# Patient Record
Sex: Female | Born: 1935 | Race: White | Hispanic: No | Marital: Single | Smoking: Never smoker
Health system: Southern US, Community
[De-identification: ages and names within clinical notes are randomized; demographics above are authoritative.]

## PROBLEM LIST (undated history)

## (undated) DIAGNOSIS — H409 Unspecified glaucoma: Secondary | ICD-10-CM

## (undated) DIAGNOSIS — K209 Esophagitis, unspecified without bleeding: Secondary | ICD-10-CM

## (undated) DIAGNOSIS — G4730 Sleep apnea, unspecified: Secondary | ICD-10-CM

## (undated) DIAGNOSIS — M810 Age-related osteoporosis without current pathological fracture: Secondary | ICD-10-CM

## (undated) DIAGNOSIS — K219 Gastro-esophageal reflux disease without esophagitis: Secondary | ICD-10-CM

## (undated) DIAGNOSIS — M1990 Unspecified osteoarthritis, unspecified site: Secondary | ICD-10-CM

## (undated) DIAGNOSIS — E7800 Pure hypercholesterolemia, unspecified: Secondary | ICD-10-CM

## (undated) DIAGNOSIS — J42 Unspecified chronic bronchitis: Secondary | ICD-10-CM

## (undated) DIAGNOSIS — G459 Transient cerebral ischemic attack, unspecified: Secondary | ICD-10-CM

## (undated) DIAGNOSIS — K2270 Barrett's esophagus without dysplasia: Secondary | ICD-10-CM

## (undated) DIAGNOSIS — F329 Major depressive disorder, single episode, unspecified: Secondary | ICD-10-CM

## (undated) DIAGNOSIS — F32A Depression, unspecified: Secondary | ICD-10-CM

## (undated) HISTORY — DX: Esophagitis, unspecified without bleeding: K20.90

## (undated) HISTORY — DX: Pure hypercholesterolemia, unspecified: E78.00

## (undated) HISTORY — DX: Unspecified osteoarthritis, unspecified site: M19.90

## (undated) HISTORY — DX: Transient cerebral ischemic attack, unspecified: G45.9

## (undated) HISTORY — PX: BUNIONECTOMY: SHX129

## (undated) HISTORY — PX: ROTATOR CUFF REPAIR: SHX139

## (undated) HISTORY — DX: Unspecified glaucoma: H40.9

## (undated) HISTORY — DX: Age-related osteoporosis without current pathological fracture: M81.0

## (undated) HISTORY — DX: Gastro-esophageal reflux disease without esophagitis: K21.9

## (undated) HISTORY — DX: Major depressive disorder, single episode, unspecified: F32.9

## (undated) HISTORY — DX: Sleep apnea, unspecified: G47.30

## (undated) HISTORY — DX: Depression, unspecified: F32.A

## (undated) HISTORY — DX: Esophagitis, unspecified: K20.9

## (undated) HISTORY — DX: Barrett's esophagus without dysplasia: K22.70

## (undated) HISTORY — DX: Unspecified chronic bronchitis: J42

---

## 2004-05-01 ENCOUNTER — Encounter: Payer: Self-pay | Admitting: Internal Medicine

## 2004-06-01 ENCOUNTER — Encounter: Payer: Self-pay | Admitting: Internal Medicine

## 2004-06-29 ENCOUNTER — Ambulatory Visit: Payer: Self-pay | Admitting: Internal Medicine

## 2004-07-01 ENCOUNTER — Encounter: Payer: Self-pay | Admitting: Internal Medicine

## 2004-08-01 ENCOUNTER — Encounter: Payer: Self-pay | Admitting: Internal Medicine

## 2004-08-04 ENCOUNTER — Ambulatory Visit: Payer: Self-pay | Admitting: Internal Medicine

## 2004-09-01 ENCOUNTER — Encounter: Payer: Self-pay | Admitting: Internal Medicine

## 2004-09-29 ENCOUNTER — Encounter: Payer: Self-pay | Admitting: Internal Medicine

## 2004-10-30 ENCOUNTER — Encounter: Payer: Self-pay | Admitting: Internal Medicine

## 2004-11-29 ENCOUNTER — Encounter: Payer: Self-pay | Admitting: Internal Medicine

## 2004-12-30 ENCOUNTER — Encounter: Payer: Self-pay | Admitting: Internal Medicine

## 2005-01-29 ENCOUNTER — Encounter: Payer: Self-pay | Admitting: Internal Medicine

## 2005-03-01 ENCOUNTER — Encounter: Payer: Self-pay | Admitting: Internal Medicine

## 2005-04-01 ENCOUNTER — Encounter: Payer: Self-pay | Admitting: Internal Medicine

## 2005-05-01 ENCOUNTER — Encounter: Payer: Self-pay | Admitting: Internal Medicine

## 2005-05-04 ENCOUNTER — Ambulatory Visit: Payer: Self-pay | Admitting: Internal Medicine

## 2005-06-01 ENCOUNTER — Encounter: Payer: Self-pay | Admitting: Internal Medicine

## 2005-07-01 ENCOUNTER — Encounter: Payer: Self-pay | Admitting: Internal Medicine

## 2005-07-22 ENCOUNTER — Encounter: Payer: Self-pay | Admitting: Internal Medicine

## 2005-08-01 ENCOUNTER — Encounter: Payer: Self-pay | Admitting: Internal Medicine

## 2005-08-08 ENCOUNTER — Ambulatory Visit: Payer: Self-pay | Admitting: Internal Medicine

## 2005-08-11 ENCOUNTER — Encounter: Payer: Self-pay | Admitting: Internal Medicine

## 2005-09-01 ENCOUNTER — Encounter: Payer: Self-pay | Admitting: Internal Medicine

## 2005-09-29 ENCOUNTER — Encounter: Payer: Self-pay | Admitting: Internal Medicine

## 2005-10-30 ENCOUNTER — Encounter: Payer: Self-pay | Admitting: Internal Medicine

## 2005-11-29 ENCOUNTER — Encounter: Payer: Self-pay | Admitting: Internal Medicine

## 2005-12-30 ENCOUNTER — Encounter: Payer: Self-pay | Admitting: Internal Medicine

## 2006-01-29 ENCOUNTER — Encounter: Payer: Self-pay | Admitting: Internal Medicine

## 2006-03-01 ENCOUNTER — Encounter: Payer: Self-pay | Admitting: Internal Medicine

## 2006-04-01 ENCOUNTER — Encounter: Payer: Self-pay | Admitting: Internal Medicine

## 2006-05-01 ENCOUNTER — Encounter: Payer: Self-pay | Admitting: Internal Medicine

## 2006-06-01 ENCOUNTER — Encounter: Payer: Self-pay | Admitting: Internal Medicine

## 2006-08-11 ENCOUNTER — Ambulatory Visit: Payer: Self-pay | Admitting: Internal Medicine

## 2006-08-21 ENCOUNTER — Ambulatory Visit: Payer: Self-pay | Admitting: Internal Medicine

## 2006-10-06 ENCOUNTER — Encounter: Payer: Self-pay | Admitting: Internal Medicine

## 2006-10-28 ENCOUNTER — Inpatient Hospital Stay: Payer: Self-pay | Admitting: Rheumatology

## 2006-10-28 ENCOUNTER — Other Ambulatory Visit: Payer: Self-pay

## 2006-10-31 ENCOUNTER — Encounter: Payer: Self-pay | Admitting: Internal Medicine

## 2006-12-18 ENCOUNTER — Encounter: Payer: Self-pay | Admitting: Internal Medicine

## 2006-12-31 ENCOUNTER — Encounter: Payer: Self-pay | Admitting: Internal Medicine

## 2007-02-11 ENCOUNTER — Emergency Department: Payer: Self-pay | Admitting: Emergency Medicine

## 2007-03-01 ENCOUNTER — Encounter: Payer: Self-pay | Admitting: Internal Medicine

## 2007-05-10 ENCOUNTER — Encounter: Payer: Self-pay | Admitting: Rheumatology

## 2007-06-02 ENCOUNTER — Encounter: Payer: Self-pay | Admitting: Rheumatology

## 2007-07-02 ENCOUNTER — Encounter: Payer: Self-pay | Admitting: Rheumatology

## 2007-08-09 ENCOUNTER — Ambulatory Visit: Payer: Self-pay | Admitting: Internal Medicine

## 2007-09-19 ENCOUNTER — Ambulatory Visit: Payer: Self-pay | Admitting: Internal Medicine

## 2007-12-05 ENCOUNTER — Ambulatory Visit: Payer: Self-pay | Admitting: Internal Medicine

## 2008-01-22 ENCOUNTER — Ambulatory Visit: Payer: Self-pay | Admitting: Internal Medicine

## 2008-07-28 ENCOUNTER — Encounter: Payer: Self-pay | Admitting: Internal Medicine

## 2008-08-01 ENCOUNTER — Encounter: Payer: Self-pay | Admitting: Internal Medicine

## 2008-09-01 ENCOUNTER — Encounter: Payer: Self-pay | Admitting: Internal Medicine

## 2008-09-29 ENCOUNTER — Encounter: Payer: Self-pay | Admitting: Internal Medicine

## 2008-09-30 ENCOUNTER — Encounter: Payer: Self-pay | Admitting: Pulmonary Disease

## 2008-10-15 ENCOUNTER — Ambulatory Visit: Payer: Self-pay | Admitting: Internal Medicine

## 2008-10-30 ENCOUNTER — Encounter: Payer: Self-pay | Admitting: Internal Medicine

## 2008-11-29 ENCOUNTER — Encounter: Payer: Self-pay | Admitting: Internal Medicine

## 2009-03-23 ENCOUNTER — Encounter: Payer: Self-pay | Admitting: Pulmonary Disease

## 2009-04-16 DIAGNOSIS — M810 Age-related osteoporosis without current pathological fracture: Secondary | ICD-10-CM | POA: Insufficient documentation

## 2009-04-16 DIAGNOSIS — G459 Transient cerebral ischemic attack, unspecified: Secondary | ICD-10-CM | POA: Insufficient documentation

## 2009-04-16 DIAGNOSIS — K2270 Barrett's esophagus without dysplasia: Secondary | ICD-10-CM | POA: Insufficient documentation

## 2009-04-16 DIAGNOSIS — E7800 Pure hypercholesterolemia, unspecified: Secondary | ICD-10-CM | POA: Insufficient documentation

## 2009-04-16 DIAGNOSIS — H409 Unspecified glaucoma: Secondary | ICD-10-CM | POA: Insufficient documentation

## 2009-04-16 DIAGNOSIS — G4733 Obstructive sleep apnea (adult) (pediatric): Secondary | ICD-10-CM | POA: Insufficient documentation

## 2009-04-16 DIAGNOSIS — M1990 Unspecified osteoarthritis, unspecified site: Secondary | ICD-10-CM | POA: Insufficient documentation

## 2009-04-17 ENCOUNTER — Ambulatory Visit: Payer: Self-pay | Admitting: Pulmonary Disease

## 2009-04-17 DIAGNOSIS — R0602 Shortness of breath: Secondary | ICD-10-CM | POA: Insufficient documentation

## 2009-10-30 ENCOUNTER — Ambulatory Visit: Payer: Self-pay | Admitting: Internal Medicine

## 2009-12-17 ENCOUNTER — Emergency Department: Payer: Self-pay | Admitting: Emergency Medicine

## 2009-12-20 ENCOUNTER — Emergency Department: Payer: Self-pay | Admitting: Emergency Medicine

## 2009-12-22 ENCOUNTER — Emergency Department: Payer: Self-pay | Admitting: Emergency Medicine

## 2009-12-28 ENCOUNTER — Emergency Department: Payer: Self-pay | Admitting: Unknown Physician Specialty

## 2009-12-31 ENCOUNTER — Emergency Department: Payer: Self-pay | Admitting: Emergency Medicine

## 2010-01-03 ENCOUNTER — Emergency Department: Payer: Self-pay | Admitting: Internal Medicine

## 2010-01-06 ENCOUNTER — Ambulatory Visit: Payer: Self-pay | Admitting: Internal Medicine

## 2010-01-08 ENCOUNTER — Ambulatory Visit: Payer: Self-pay | Admitting: Internal Medicine

## 2010-01-11 ENCOUNTER — Ambulatory Visit: Payer: Self-pay | Admitting: Internal Medicine

## 2010-01-14 ENCOUNTER — Ambulatory Visit: Payer: Self-pay | Admitting: Internal Medicine

## 2010-01-18 ENCOUNTER — Ambulatory Visit: Payer: Self-pay | Admitting: Internal Medicine

## 2010-01-22 ENCOUNTER — Ambulatory Visit: Payer: Self-pay | Admitting: Internal Medicine

## 2010-01-26 ENCOUNTER — Ambulatory Visit: Payer: Self-pay | Admitting: Internal Medicine

## 2010-02-03 ENCOUNTER — Ambulatory Visit: Payer: Self-pay | Admitting: Internal Medicine

## 2010-02-11 ENCOUNTER — Ambulatory Visit: Payer: Self-pay | Admitting: Internal Medicine

## 2010-02-12 ENCOUNTER — Ambulatory Visit: Payer: Self-pay | Admitting: Internal Medicine

## 2010-02-16 ENCOUNTER — Ambulatory Visit: Payer: Self-pay | Admitting: Internal Medicine

## 2010-02-23 ENCOUNTER — Ambulatory Visit: Payer: Self-pay | Admitting: Internal Medicine

## 2010-11-02 ENCOUNTER — Ambulatory Visit: Payer: Self-pay | Admitting: Internal Medicine

## 2010-12-20 ENCOUNTER — Emergency Department: Payer: Self-pay | Admitting: Emergency Medicine

## 2010-12-21 ENCOUNTER — Emergency Department: Payer: Self-pay | Admitting: Emergency Medicine

## 2011-08-04 DIAGNOSIS — H4011X0 Primary open-angle glaucoma, stage unspecified: Secondary | ICD-10-CM | POA: Diagnosis not present

## 2011-08-04 DIAGNOSIS — H353190 Nonexudative age-related macular degeneration, unspecified eye, stage unspecified: Secondary | ICD-10-CM | POA: Diagnosis not present

## 2011-08-04 DIAGNOSIS — H35359 Cystoid macular degeneration, unspecified eye: Secondary | ICD-10-CM | POA: Diagnosis not present

## 2011-08-04 DIAGNOSIS — H5700 Unspecified anomaly of pupillary function: Secondary | ICD-10-CM | POA: Diagnosis not present

## 2011-08-09 DIAGNOSIS — R5381 Other malaise: Secondary | ICD-10-CM | POA: Diagnosis not present

## 2011-08-09 DIAGNOSIS — E7800 Pure hypercholesterolemia, unspecified: Secondary | ICD-10-CM | POA: Diagnosis not present

## 2011-08-09 DIAGNOSIS — M810 Age-related osteoporosis without current pathological fracture: Secondary | ICD-10-CM | POA: Diagnosis not present

## 2011-08-16 DIAGNOSIS — R059 Cough, unspecified: Secondary | ICD-10-CM | POA: Diagnosis not present

## 2011-08-16 DIAGNOSIS — R05 Cough: Secondary | ICD-10-CM | POA: Diagnosis not present

## 2011-08-16 DIAGNOSIS — K209 Esophagitis, unspecified without bleeding: Secondary | ICD-10-CM | POA: Diagnosis not present

## 2011-08-16 DIAGNOSIS — K219 Gastro-esophageal reflux disease without esophagitis: Secondary | ICD-10-CM | POA: Diagnosis not present

## 2011-08-16 DIAGNOSIS — M5137 Other intervertebral disc degeneration, lumbosacral region: Secondary | ICD-10-CM | POA: Diagnosis not present

## 2011-08-16 DIAGNOSIS — M810 Age-related osteoporosis without current pathological fracture: Secondary | ICD-10-CM | POA: Diagnosis not present

## 2011-08-16 DIAGNOSIS — M549 Dorsalgia, unspecified: Secondary | ICD-10-CM | POA: Diagnosis not present

## 2011-08-25 DIAGNOSIS — Z1211 Encounter for screening for malignant neoplasm of colon: Secondary | ICD-10-CM | POA: Diagnosis not present

## 2011-08-31 DIAGNOSIS — H353190 Nonexudative age-related macular degeneration, unspecified eye, stage unspecified: Secondary | ICD-10-CM | POA: Diagnosis not present

## 2011-08-31 DIAGNOSIS — H35369 Drusen (degenerative) of macula, unspecified eye: Secondary | ICD-10-CM | POA: Diagnosis not present

## 2011-08-31 DIAGNOSIS — H4011X0 Primary open-angle glaucoma, stage unspecified: Secondary | ICD-10-CM | POA: Diagnosis not present

## 2011-08-31 DIAGNOSIS — H409 Unspecified glaucoma: Secondary | ICD-10-CM | POA: Diagnosis not present

## 2011-09-21 DIAGNOSIS — R918 Other nonspecific abnormal finding of lung field: Secondary | ICD-10-CM | POA: Diagnosis not present

## 2011-09-21 DIAGNOSIS — J8289 Other pulmonary eosinophilia, not elsewhere classified: Secondary | ICD-10-CM | POA: Diagnosis not present

## 2011-09-22 DIAGNOSIS — H353190 Nonexudative age-related macular degeneration, unspecified eye, stage unspecified: Secondary | ICD-10-CM | POA: Diagnosis not present

## 2011-09-22 DIAGNOSIS — H409 Unspecified glaucoma: Secondary | ICD-10-CM | POA: Diagnosis not present

## 2011-09-22 DIAGNOSIS — H35369 Drusen (degenerative) of macula, unspecified eye: Secondary | ICD-10-CM | POA: Diagnosis not present

## 2011-09-22 DIAGNOSIS — H4011X0 Primary open-angle glaucoma, stage unspecified: Secondary | ICD-10-CM | POA: Diagnosis not present

## 2011-10-12 DIAGNOSIS — R351 Nocturia: Secondary | ICD-10-CM | POA: Diagnosis not present

## 2011-11-14 DIAGNOSIS — H4011X0 Primary open-angle glaucoma, stage unspecified: Secondary | ICD-10-CM | POA: Diagnosis not present

## 2011-11-14 DIAGNOSIS — H409 Unspecified glaucoma: Secondary | ICD-10-CM | POA: Diagnosis not present

## 2011-11-14 DIAGNOSIS — Z961 Presence of intraocular lens: Secondary | ICD-10-CM | POA: Diagnosis not present

## 2011-11-15 ENCOUNTER — Ambulatory Visit: Payer: Self-pay | Admitting: Internal Medicine

## 2011-11-15 DIAGNOSIS — Z1231 Encounter for screening mammogram for malignant neoplasm of breast: Secondary | ICD-10-CM | POA: Diagnosis not present

## 2011-11-15 DIAGNOSIS — N6459 Other signs and symptoms in breast: Secondary | ICD-10-CM | POA: Diagnosis not present

## 2011-11-17 ENCOUNTER — Ambulatory Visit: Payer: Self-pay | Admitting: Internal Medicine

## 2011-11-17 DIAGNOSIS — N63 Unspecified lump in unspecified breast: Secondary | ICD-10-CM | POA: Diagnosis not present

## 2011-11-17 DIAGNOSIS — N6489 Other specified disorders of breast: Secondary | ICD-10-CM | POA: Diagnosis not present

## 2011-11-21 ENCOUNTER — Encounter: Payer: Self-pay | Admitting: Internal Medicine

## 2011-11-21 DIAGNOSIS — R262 Difficulty in walking, not elsewhere classified: Secondary | ICD-10-CM | POA: Diagnosis not present

## 2011-11-21 DIAGNOSIS — IMO0001 Reserved for inherently not codable concepts without codable children: Secondary | ICD-10-CM | POA: Diagnosis not present

## 2011-11-30 ENCOUNTER — Encounter: Payer: Self-pay | Admitting: Internal Medicine

## 2011-12-07 DIAGNOSIS — H903 Sensorineural hearing loss, bilateral: Secondary | ICD-10-CM | POA: Diagnosis not present

## 2011-12-07 DIAGNOSIS — H6120 Impacted cerumen, unspecified ear: Secondary | ICD-10-CM | POA: Diagnosis not present

## 2011-12-13 DIAGNOSIS — H409 Unspecified glaucoma: Secondary | ICD-10-CM | POA: Diagnosis not present

## 2011-12-13 DIAGNOSIS — H401490 Capsular glaucoma with pseudoexfoliation of lens, unspecified eye, stage unspecified: Secondary | ICD-10-CM | POA: Diagnosis not present

## 2011-12-14 DIAGNOSIS — H6120 Impacted cerumen, unspecified ear: Secondary | ICD-10-CM | POA: Diagnosis not present

## 2011-12-14 DIAGNOSIS — H903 Sensorineural hearing loss, bilateral: Secondary | ICD-10-CM | POA: Diagnosis not present

## 2011-12-30 DIAGNOSIS — H60509 Unspecified acute noninfective otitis externa, unspecified ear: Secondary | ICD-10-CM | POA: Diagnosis not present

## 2012-01-10 DIAGNOSIS — H60509 Unspecified acute noninfective otitis externa, unspecified ear: Secondary | ICD-10-CM | POA: Diagnosis not present

## 2012-01-13 ENCOUNTER — Ambulatory Visit: Payer: Self-pay | Admitting: Anesthesiology

## 2012-01-13 ENCOUNTER — Ambulatory Visit: Payer: Self-pay | Admitting: Unknown Physician Specialty

## 2012-01-13 DIAGNOSIS — Z01810 Encounter for preprocedural cardiovascular examination: Secondary | ICD-10-CM | POA: Diagnosis not present

## 2012-01-13 DIAGNOSIS — H6520 Chronic serous otitis media, unspecified ear: Secondary | ICD-10-CM | POA: Diagnosis not present

## 2012-01-13 DIAGNOSIS — H60509 Unspecified acute noninfective otitis externa, unspecified ear: Secondary | ICD-10-CM | POA: Diagnosis not present

## 2012-01-13 DIAGNOSIS — H6980 Other specified disorders of Eustachian tube, unspecified ear: Secondary | ICD-10-CM | POA: Diagnosis not present

## 2012-01-13 DIAGNOSIS — K219 Gastro-esophageal reflux disease without esophagitis: Secondary | ICD-10-CM | POA: Diagnosis not present

## 2012-01-13 DIAGNOSIS — Z8673 Personal history of transient ischemic attack (TIA), and cerebral infarction without residual deficits: Secondary | ICD-10-CM | POA: Diagnosis not present

## 2012-01-13 DIAGNOSIS — Z7982 Long term (current) use of aspirin: Secondary | ICD-10-CM | POA: Diagnosis not present

## 2012-01-13 DIAGNOSIS — G4733 Obstructive sleep apnea (adult) (pediatric): Secondary | ICD-10-CM | POA: Diagnosis not present

## 2012-01-13 DIAGNOSIS — H6120 Impacted cerumen, unspecified ear: Secondary | ICD-10-CM | POA: Diagnosis not present

## 2012-01-13 DIAGNOSIS — Z79899 Other long term (current) drug therapy: Secondary | ICD-10-CM | POA: Diagnosis not present

## 2012-01-17 DIAGNOSIS — Z961 Presence of intraocular lens: Secondary | ICD-10-CM | POA: Diagnosis not present

## 2012-01-17 DIAGNOSIS — H409 Unspecified glaucoma: Secondary | ICD-10-CM | POA: Diagnosis not present

## 2012-01-17 DIAGNOSIS — H4011X0 Primary open-angle glaucoma, stage unspecified: Secondary | ICD-10-CM | POA: Diagnosis not present

## 2012-01-25 DIAGNOSIS — M779 Enthesopathy, unspecified: Secondary | ICD-10-CM | POA: Diagnosis not present

## 2012-01-25 DIAGNOSIS — L600 Ingrowing nail: Secondary | ICD-10-CM | POA: Diagnosis not present

## 2012-01-25 DIAGNOSIS — Q6689 Other  specified congenital deformities of feet: Secondary | ICD-10-CM | POA: Diagnosis not present

## 2012-01-26 DIAGNOSIS — H903 Sensorineural hearing loss, bilateral: Secondary | ICD-10-CM | POA: Diagnosis not present

## 2012-01-26 DIAGNOSIS — H905 Unspecified sensorineural hearing loss: Secondary | ICD-10-CM | POA: Diagnosis not present

## 2012-02-15 DIAGNOSIS — D709 Neutropenia, unspecified: Secondary | ICD-10-CM | POA: Diagnosis not present

## 2012-02-15 DIAGNOSIS — E7800 Pure hypercholesterolemia, unspecified: Secondary | ICD-10-CM | POA: Diagnosis not present

## 2012-02-15 DIAGNOSIS — E559 Vitamin D deficiency, unspecified: Secondary | ICD-10-CM | POA: Diagnosis not present

## 2012-02-16 DIAGNOSIS — R0602 Shortness of breath: Secondary | ICD-10-CM | POA: Diagnosis not present

## 2012-02-16 DIAGNOSIS — R0681 Apnea, not elsewhere classified: Secondary | ICD-10-CM | POA: Diagnosis not present

## 2012-02-16 DIAGNOSIS — E7800 Pure hypercholesterolemia, unspecified: Secondary | ICD-10-CM | POA: Diagnosis not present

## 2012-02-16 DIAGNOSIS — R5381 Other malaise: Secondary | ICD-10-CM | POA: Diagnosis not present

## 2012-02-27 DIAGNOSIS — R0602 Shortness of breath: Secondary | ICD-10-CM | POA: Diagnosis not present

## 2012-03-07 DIAGNOSIS — D709 Neutropenia, unspecified: Secondary | ICD-10-CM | POA: Diagnosis not present

## 2012-03-20 DIAGNOSIS — Z961 Presence of intraocular lens: Secondary | ICD-10-CM | POA: Diagnosis not present

## 2012-03-20 DIAGNOSIS — H4011X0 Primary open-angle glaucoma, stage unspecified: Secondary | ICD-10-CM | POA: Diagnosis not present

## 2012-03-30 DIAGNOSIS — L819 Disorder of pigmentation, unspecified: Secondary | ICD-10-CM | POA: Diagnosis not present

## 2012-03-30 DIAGNOSIS — L570 Actinic keratosis: Secondary | ICD-10-CM | POA: Diagnosis not present

## 2012-03-30 DIAGNOSIS — L820 Inflamed seborrheic keratosis: Secondary | ICD-10-CM | POA: Diagnosis not present

## 2012-04-05 DIAGNOSIS — H02409 Unspecified ptosis of unspecified eyelid: Secondary | ICD-10-CM | POA: Diagnosis not present

## 2012-04-09 DIAGNOSIS — H409 Unspecified glaucoma: Secondary | ICD-10-CM | POA: Diagnosis not present

## 2012-04-09 DIAGNOSIS — H4011X0 Primary open-angle glaucoma, stage unspecified: Secondary | ICD-10-CM | POA: Diagnosis not present

## 2012-04-09 DIAGNOSIS — H1030 Unspecified acute conjunctivitis, unspecified eye: Secondary | ICD-10-CM | POA: Diagnosis not present

## 2012-04-10 DIAGNOSIS — H1820 Unspecified corneal edema: Secondary | ICD-10-CM | POA: Diagnosis not present

## 2012-04-10 DIAGNOSIS — Z947 Corneal transplant status: Secondary | ICD-10-CM | POA: Diagnosis not present

## 2012-04-19 DIAGNOSIS — Z23 Encounter for immunization: Secondary | ICD-10-CM | POA: Diagnosis not present

## 2012-04-20 DIAGNOSIS — N390 Urinary tract infection, site not specified: Secondary | ICD-10-CM | POA: Diagnosis not present

## 2012-04-30 DIAGNOSIS — J159 Unspecified bacterial pneumonia: Secondary | ICD-10-CM | POA: Diagnosis not present

## 2012-04-30 DIAGNOSIS — R05 Cough: Secondary | ICD-10-CM | POA: Diagnosis not present

## 2012-04-30 DIAGNOSIS — R059 Cough, unspecified: Secondary | ICD-10-CM | POA: Diagnosis not present

## 2012-05-14 ENCOUNTER — Telehealth: Payer: Self-pay | Admitting: Internal Medicine

## 2012-05-14 DIAGNOSIS — J209 Acute bronchitis, unspecified: Secondary | ICD-10-CM | POA: Diagnosis not present

## 2012-05-14 NOTE — Telephone Encounter (Signed)
Confirm no acute sx (ie sob, etc).  If no acute problems and able to wait - i can see her wed 10/16 at 10:00.  If no acute problems can forward to Robin to schedule.

## 2012-05-14 NOTE — Telephone Encounter (Signed)
Pt called left message to see if she could see you.  She thinks she has broncitias

## 2012-05-16 DIAGNOSIS — H4011X0 Primary open-angle glaucoma, stage unspecified: Secondary | ICD-10-CM | POA: Diagnosis not present

## 2012-05-16 DIAGNOSIS — H20029 Recurrent acute iridocyclitis, unspecified eye: Secondary | ICD-10-CM | POA: Diagnosis not present

## 2012-05-16 DIAGNOSIS — H409 Unspecified glaucoma: Secondary | ICD-10-CM | POA: Diagnosis not present

## 2012-05-16 NOTE — Telephone Encounter (Signed)
Dr. Lorin Picket, I just now saw this message.  Can we bring patient in tomorrow 05/17/2012 for an acute visit?

## 2012-05-16 NOTE — Telephone Encounter (Signed)
Yes, can bring her in at 2:30 tomorrow.

## 2012-05-17 ENCOUNTER — Ambulatory Visit (INDEPENDENT_AMBULATORY_CARE_PROVIDER_SITE_OTHER): Payer: Medicare Other | Admitting: Internal Medicine

## 2012-05-17 ENCOUNTER — Encounter: Payer: Self-pay | Admitting: Internal Medicine

## 2012-05-17 VITALS — BP 122/72 | HR 86 | Temp 97.7°F | Wt 104.0 lb

## 2012-05-17 DIAGNOSIS — G4733 Obstructive sleep apnea (adult) (pediatric): Secondary | ICD-10-CM

## 2012-05-17 DIAGNOSIS — R5381 Other malaise: Secondary | ICD-10-CM

## 2012-05-17 DIAGNOSIS — K2270 Barrett's esophagus without dysplasia: Secondary | ICD-10-CM

## 2012-05-17 DIAGNOSIS — R5383 Other fatigue: Secondary | ICD-10-CM | POA: Diagnosis not present

## 2012-05-17 NOTE — Telephone Encounter (Signed)
Patient advised as instructed via telephone, she was scheduled today with Dr. Lorin Picket at 2:30.

## 2012-05-17 NOTE — Patient Instructions (Addendum)
It was nice seeing you today.  I am sorry you have not been feeling well.  I am going to check some routine labs to make sure nothing metabolically is contributing to the fatigue.  I am going to hold off prescribing anything new right now.  I want to review what you have been prescribed or have taken recently.

## 2012-05-18 LAB — CBC WITH DIFFERENTIAL/PLATELET
Basophils Absolute: 0.1 10*3/uL (ref 0.0–0.1)
Basophils Relative: 0.9 % (ref 0.0–3.0)
Eosinophils Absolute: 0.2 10*3/uL (ref 0.0–0.7)
Lymphocytes Relative: 25 % (ref 12.0–46.0)
MCHC: 32.6 g/dL (ref 30.0–36.0)
Monocytes Relative: 9.4 % (ref 3.0–12.0)
Neutrophils Relative %: 62.1 % (ref 43.0–77.0)
RBC: 4.69 Mil/uL (ref 3.87–5.11)

## 2012-05-18 LAB — COMPREHENSIVE METABOLIC PANEL
ALT: 21 U/L (ref 0–35)
AST: 22 U/L (ref 0–37)
Albumin: 3.7 g/dL (ref 3.5–5.2)
CO2: 30 mEq/L (ref 19–32)
Calcium: 9.4 mg/dL (ref 8.4–10.5)
Chloride: 103 mEq/L (ref 96–112)
Potassium: 4.5 mEq/L (ref 3.5–5.1)

## 2012-05-23 DIAGNOSIS — N390 Urinary tract infection, site not specified: Secondary | ICD-10-CM | POA: Diagnosis not present

## 2012-05-24 ENCOUNTER — Telehealth: Payer: Self-pay | Admitting: Internal Medicine

## 2012-05-24 ENCOUNTER — Emergency Department: Payer: Self-pay | Admitting: Emergency Medicine

## 2012-05-24 DIAGNOSIS — R197 Diarrhea, unspecified: Secondary | ICD-10-CM | POA: Diagnosis not present

## 2012-05-24 DIAGNOSIS — Z7982 Long term (current) use of aspirin: Secondary | ICD-10-CM | POA: Diagnosis not present

## 2012-05-24 DIAGNOSIS — R6889 Other general symptoms and signs: Secondary | ICD-10-CM | POA: Diagnosis not present

## 2012-05-24 DIAGNOSIS — R110 Nausea: Secondary | ICD-10-CM | POA: Diagnosis not present

## 2012-05-24 DIAGNOSIS — J189 Pneumonia, unspecified organism: Secondary | ICD-10-CM | POA: Diagnosis not present

## 2012-05-24 DIAGNOSIS — R5381 Other malaise: Secondary | ICD-10-CM | POA: Diagnosis not present

## 2012-05-24 DIAGNOSIS — J90 Pleural effusion, not elsewhere classified: Secondary | ICD-10-CM | POA: Diagnosis not present

## 2012-05-24 DIAGNOSIS — N390 Urinary tract infection, site not specified: Secondary | ICD-10-CM | POA: Diagnosis not present

## 2012-05-24 DIAGNOSIS — R059 Cough, unspecified: Secondary | ICD-10-CM | POA: Diagnosis not present

## 2012-05-24 DIAGNOSIS — R51 Headache: Secondary | ICD-10-CM | POA: Diagnosis not present

## 2012-05-24 DIAGNOSIS — R05 Cough: Secondary | ICD-10-CM | POA: Diagnosis not present

## 2012-05-24 LAB — URINALYSIS, COMPLETE
Bilirubin,UR: NEGATIVE
Protein: NEGATIVE
Specific Gravity: 1.02 (ref 1.003–1.030)
Squamous Epithelial: <1

## 2012-05-24 LAB — COMPREHENSIVE METABOLIC PANEL
Alkaline Phosphatase: 73 U/L (ref 50–136)
Calcium, Total: 8.4 mg/dL — ABNORMAL LOW (ref 8.5–10.1)
Chloride: 102 mmol/L (ref 98–107)
Co2: 25 mmol/L (ref 21–32)
EGFR (African American): >60
EGFR (Non-African Amer.): >60
Osmolality: 273 (ref 275–301)
Sodium: 136 mmol/L (ref 136–145)

## 2012-05-24 LAB — CBC
HGB: 14.7 g/dL (ref 12.0–16.0)
MCHC: 34.6 g/dL (ref 32.0–36.0)
Platelet: 220 10*3/uL (ref 150–440)
RDW: 13.7 % (ref 11.5–14.5)

## 2012-05-24 LAB — TROPONIN I: Troponin-I: <0.02 ng/mL

## 2012-05-24 LAB — CK TOTAL AND CKMB (NOT AT ARMC): CK, Total: 41 U/L (ref 21–215)

## 2012-05-24 NOTE — Telephone Encounter (Signed)
Called patient at home could not understand her, so I called her friend back [place] and spoke to her. Jerene Pitch wants to know if we can change her antibiotic. Scott  Advised that Daniel be evaluated at acute care or er.

## 2012-05-24 NOTE — Telephone Encounter (Signed)
If pt is disoriented, she needs evaluation today.  rec either acute care or er depending on sx.

## 2012-05-24 NOTE — Telephone Encounter (Signed)
Pt was seen recently at Urgent For Bladder Infection or UTI ??? Pt is disoriented. She started taking the pills from [place] and got really sick. She was wondering if she could come in or be prescribed another RX for another antibiotic.  Just in case you cant get patient please call Terri Skains at [phone]

## 2012-05-25 NOTE — Telephone Encounter (Signed)
Pt's friends Jerene Pitch is calling back about pt. She was taken to [facility] for fluid around lungs and UTI. They wanted her to f/u sometime on Mon if possible.   Please contact Kistanna at [phone]

## 2012-05-25 NOTE — Telephone Encounter (Signed)
Spoke with Pt and left message for [place]. Pt says she will be here Monday at 1:15 pm.

## 2012-05-25 NOTE — Telephone Encounter (Signed)
Can schedule her at 1:15 on Monday 05/28/12 - for er follow up.  Thanks.

## 2012-05-28 ENCOUNTER — Encounter: Payer: Self-pay | Admitting: Internal Medicine

## 2012-05-28 ENCOUNTER — Ambulatory Visit (INDEPENDENT_AMBULATORY_CARE_PROVIDER_SITE_OTHER): Payer: Medicare Other | Admitting: Internal Medicine

## 2012-05-28 VITALS — BP 146/85 | HR 83 | Temp 97.8°F | Ht 60.5 in | Wt 101.5 lb

## 2012-05-28 DIAGNOSIS — R0602 Shortness of breath: Secondary | ICD-10-CM

## 2012-05-28 DIAGNOSIS — K2270 Barrett's esophagus without dysplasia: Secondary | ICD-10-CM

## 2012-05-28 NOTE — Patient Instructions (Signed)
I want you to complete your antibiotics.  I am going to schedule a follow up appt soon to reassess your lungs.  Let me know if any problems before.

## 2012-05-29 ENCOUNTER — Encounter: Payer: Self-pay | Admitting: Internal Medicine

## 2012-05-29 NOTE — Progress Notes (Signed)
  Subjective:    Patient ID: Christy Cisneros, female    DOB: October 31, 1919, 78 y.o.   MRN: [id number]  HPI 78 year old female with past history of Barretts esophagitis, hypercholesterolemia, chronic bronchitis and hypercholesterolemia who comes in today as a work in for an urgent care follow up.  She was in [place] recently.  Was evaluated with a CXR and given antibiotics.  Christy Cisneros - [phone]).  She is unsure of the antibiotics she was given.  She did not use the inhaler.  Does still report some cough, but states she feels better.  Reports noticing some fatigue.  Not as active.  Not using her inhaler.    Past Medical History  Diagnosis Date  . Chronic bronchitis   . Hypercholesteremia   . GERD (gastroesophageal reflux disease)   . Barrett's esophagus with esophagitis   . Sleep apnea   . Glaucoma   . TIA (transient ischemic attack)   . Osteoporosis   . Osteoarthritis     Review of Systems Patient denies any headache, lightheadedness or dizziness.  No chest pain, tightness or palpatations. No increased shortness of breath.  Some occasional cough.  No nausea or vomiting.  No abdominal pain or cramping.  No bowel change, such as diarrhea, constipation, BRBPR or melana.  No urine change.        Objective:   Physical Exam Filed Vitals:   05/17/12 1441  BP: 122/72  Pulse: 86  Temp: 97.7 F (67.59 C)   78 year old female in no acute distress.   HEENT:  Nares - clear.  OP- without lesions or erythema.  NECK:  Supple, nontender.  No audible bruit.   HEART:  Appears to be regular. LUNGS:  Without crackles or wheezing audible.  Respirations even and unlabored.   RADIAL PULSE:  Equal bilaterally.  ABDOMEN:  Soft, nontender.  No audible abdominal bruit.   EXTREMITIES:  No increased edema to be present.                     Assessment & Plan:  PULMONARY.  Recently treated for a respiratory infection.  Obtain CXR results.  Doing better.  Will review outside records to see if  follow up CXR is warranted.  Follow closely.  If any change in symptoms or problems, let us know.   FATIGUE. Check cbc, met c and tsh.

## 2012-05-29 NOTE — Assessment & Plan Note (Signed)
Discussed importance of wearing her CPAP.  Follow.

## 2012-05-29 NOTE — Progress Notes (Signed)
  Subjective:    Patient ID: Christy Cisneros, female    DOB: 03/01/1920, 78 y.o.   MRN: [id number]  HPI 78 year old female with past history of chronic bronchitis, Barretts esophagitis, sleep apnea and hypercholesterolemia who comes in today for an ER follow up.  Was evaluated last week.  States she had some "infection or pleurisy in her lungs".  No ER records available.  Had xray.  Placed on antibiotics.  She feels better.  Feels more like her normal self.  States she is eating and drinking.  Getting out more.  No sob.  No chest pain of tightness currently.  No nausea or vomiting.  She did scrape her leg several days ago.  No significant pain.  Is on oral antibiotics.   Past Medical History  Diagnosis Date  . Chronic bronchitis   . Hypercholesteremia   . GERD (gastroesophageal reflux disease)   . Barrett's esophagus with esophagitis   . Sleep apnea   . Glaucoma   . TIA (transient ischemic attack)   . Osteoporosis   . Osteoarthritis     Review of Systems Patient denies any headache, lightheadedness or dizziness.  No significant sinus or allergy symptoms.  No chest pain, tightness or palpatations.  No increased shortness of breath, cough or congestion.  No nausea or vomiting.  No abdominal pain or cramping.  No bowel change, such as diarrhea, constipation, BRBPR or melana.  No urine change.   Doing better since being on these antibiotics.  Feels better.  Getting out more.       Objective:   Physical Exam Filed Vitals:   05/28/12 1321  BP: 146/85  Pulse: 83  Temp: 97.8 F (36.6 C)   Blood pressure recheck:  4/3  78 year old female in no acute distress.   HEENT:  Nares - clear.  OP- without lesions or erythema.  NECK:  Supple, nontender.  No audible bruit.   HEART:  Appears to be regular. LUNGS:  Without crackles or wheezing audible.  Respirations even and unlabored.  Appears to have good breath sounds bilaterally.  RADIAL PULSE:  Equal bilaterally.  ABDOMEN:  Soft,  nontender.  No audible abdominal bruit.   EXTREMITIES:  No increased edema to be present.   Small laceration - lower left leg.  No surrounding erythema.                   Assessment & Plan:  PULMONARY.  Obtain records from recent ER visit.  Obtain CXR results to review.  Have also requested CXR results from her visit to an Urgent Care in [place].  Complete antibiotics.  Doing better.  Follow up soon to reassess.    LEG LACERATION.  No evidence of infection.  On oral antibiotics.  Follow closely.

## 2012-05-29 NOTE — Assessment & Plan Note (Signed)
Better on current antibiotics.  Follow.  Obtain cxr results.

## 2012-05-29 NOTE — Assessment & Plan Note (Signed)
States reflux is controlled.  Follow.  Declines further evaluation.

## 2012-05-29 NOTE — Assessment & Plan Note (Signed)
States her reflux is controlled on her current regimen.  Follow.  Declines any further w/up.

## 2012-05-30 DIAGNOSIS — H903 Sensorineural hearing loss, bilateral: Secondary | ICD-10-CM | POA: Diagnosis not present

## 2012-05-30 DIAGNOSIS — H6120 Impacted cerumen, unspecified ear: Secondary | ICD-10-CM | POA: Diagnosis not present

## 2012-05-31 ENCOUNTER — Ambulatory Visit (INDEPENDENT_AMBULATORY_CARE_PROVIDER_SITE_OTHER): Payer: Medicare Other | Admitting: Internal Medicine

## 2012-05-31 ENCOUNTER — Telehealth: Payer: Self-pay | Admitting: Internal Medicine

## 2012-05-31 ENCOUNTER — Encounter: Payer: Self-pay | Admitting: Internal Medicine

## 2012-05-31 VITALS — BP 100/62 | HR 84 | Temp 97.7°F | Ht 60.0 in | Wt 100.5 lb

## 2012-05-31 DIAGNOSIS — R350 Frequency of micturition: Secondary | ICD-10-CM | POA: Diagnosis not present

## 2012-05-31 LAB — POCT URINALYSIS DIPSTICK
Blood, UA: NEGATIVE
Ketones, UA: NEGATIVE
Spec Grav, UA: 1.01
pH, UA: 6

## 2012-05-31 MED ORDER — AMOXICILLIN-POT CLAVULANATE 500-125 MG PO TABS: 1.0000 | ORAL_TABLET | Freq: Two times a day (BID) | ORAL | Status: DC

## 2012-05-31 NOTE — Telephone Encounter (Signed)
Tell her I would like to see her today and evaluate the wound.  See if she can come in at 12:00 - work in for this.  Tell her she may have to wait since being worked in.  Thanks.

## 2012-05-31 NOTE — Telephone Encounter (Signed)
Pt is calling concerning wound on her leg that has become infected. She was wondering if she need to come in to see you or if she could get a referral to a wound center.

## 2012-05-31 NOTE — Patient Instructions (Addendum)
It was nice seeing you today.  I am going to put you on Augmentin (antibiotic) - to take 2x/day.  I have scheduled an appt with the Wound Clinic - 06/07/12 at 9:00.  Let me know if you have any problems.

## 2012-06-01 ENCOUNTER — Encounter: Payer: Self-pay | Admitting: Internal Medicine

## 2012-06-01 NOTE — Progress Notes (Addendum)
  Subjective:    Patient ID: Christy Cisneros, female    DOB: [birth year], 78 y.o.   MRN: [id number]  HPI 78 year old female with past history of chronic bronchitis, GERD and osteoporosis who comes in today as a work in with concerns regarding possible infection in lower leg wound.  States she cut it - 1-2 weeks ago.  She applied some topical bactroban.  She has also been on Septra for a respiratory and urinary infection.  She denies any fever or chills.  Breathing appears to be doing better.  No nausea or vomiting.  No pain in the leg.   Past Medical History  Diagnosis Date  . Chronic bronchitis   . Hypercholesteremia   . GERD (gastroesophageal reflux disease)   . Barrett's esophagus with esophagitis   . Sleep apnea   . Glaucoma   . TIA (transient ischemic attack)   . Osteoporosis   . Osteoarthritis     Review of Systems Patient denies any headache, lightheadedness or dizziness.  No chest pain, tightness or palpatations. No increased shortness of breath, cough or congestion.  No nausea or vomiting.  Tolerating the antibiotics.  Feels her reflux - doing well.  No abdominal pain or cramping.  No bowel change, such as diarrhea, constipation, BRBPR or melana.  She did want to have her urine checked to confirm no infection.  Has noticed some increased problems with incontinence.  No pain in the leg.  Some minimal soft tissue swelling yesterday.  Improved today.       Objective:   Physical Exam Filed Vitals:   05/31/12 1205  BP: 100/62  Pulse: 84  Temp: 97.7 F (39.46 C)   78 year old female in no acute distress.   HEENT:  Nares - clear.  OP- without lesions or erythema.  NECK:  Supple, nontender.  No audible bruit.   HEART:  Appears to be regular. LUNGS:  Without crackles or wheezing audible.  Respirations even and unlabored.  Appears to have good breath sounds bilaterally.   RADIAL PULSE:  Equal bilaterally.  ABDOMEN:  Soft, nontender.  No audible abdominal bruit.   EXTREMITIES:  No  significant increased edema to be present.  Approximately 2cm laceration.  Black eschar covering.  Some minimal surrounding erythema.  No significant pain to palpation.  Very minimal soft tissue swelling.  DP pulses palpable and equal bilaterally.  No swelling extending up the leg.  No erythema extending up the leg.                    Assessment & Plan:  LOWER LEG LACERATION.  Exam as outlined.  On Septra.  Will start Augment 500mg  bid.  Discussed with wound clinic.  They will see her next week.  Have asked her to monitor the wound closely.  If any change or worsening, she is to be reevaluated.  Leave open as much as possible.  Stop the topical cream.    INCREASED URINARY INCONTINENCE.  Will check u/a and culture to confirm no infection.    PULMONARY.  Breathing appears to be doing better.  Follow.  CXR 09/21/11 revealed no evidence of acute cardiopulmonary disease.  Will need follow up CXRs from recent ER visit.   HEALTH MAINTENANCE.  After review of outside records:  Mammogram 11/15/11 rec additional views.  A follow up left breast mammo and ultrasound 11/17/11 - BI-RADS II.  Last physical 08/16/11.  She declines further GI evaluation.

## 2012-06-02 LAB — URINE CULTURE: Colony Count: NO GROWTH

## 2012-06-03 ENCOUNTER — Encounter: Payer: Self-pay | Admitting: Internal Medicine

## 2012-06-04 ENCOUNTER — Telehealth: Payer: Self-pay | Admitting: *Deleted

## 2012-06-04 NOTE — Telephone Encounter (Signed)
Informed patient of urine test results

## 2012-06-07 ENCOUNTER — Encounter: Payer: Self-pay | Admitting: Cardiothoracic Surgery

## 2012-06-07 ENCOUNTER — Encounter: Payer: Self-pay | Admitting: Nurse Practitioner

## 2012-06-07 DIAGNOSIS — I87309 Chronic venous hypertension (idiopathic) without complications of unspecified lower extremity: Secondary | ICD-10-CM | POA: Diagnosis not present

## 2012-06-07 DIAGNOSIS — M810 Age-related osteoporosis without current pathological fracture: Secondary | ICD-10-CM | POA: Diagnosis not present

## 2012-06-07 DIAGNOSIS — J449 Chronic obstructive pulmonary disease, unspecified: Secondary | ICD-10-CM | POA: Diagnosis not present

## 2012-06-07 DIAGNOSIS — K2270 Barrett's esophagus without dysplasia: Secondary | ICD-10-CM | POA: Diagnosis not present

## 2012-06-07 DIAGNOSIS — L97809 Non-pressure chronic ulcer of other part of unspecified lower leg with unspecified severity: Secondary | ICD-10-CM | POA: Diagnosis not present

## 2012-06-07 DIAGNOSIS — K219 Gastro-esophageal reflux disease without esophagitis: Secondary | ICD-10-CM | POA: Diagnosis not present

## 2012-06-12 ENCOUNTER — Ambulatory Visit (INDEPENDENT_AMBULATORY_CARE_PROVIDER_SITE_OTHER): Payer: Medicare Other | Admitting: Internal Medicine

## 2012-06-12 ENCOUNTER — Encounter: Payer: Self-pay | Admitting: Internal Medicine

## 2012-06-12 VITALS — BP 135/79 | HR 84 | Temp 98.1°F | Ht 60.5 in | Wt 103.5 lb

## 2012-06-12 DIAGNOSIS — E7800 Pure hypercholesterolemia, unspecified: Secondary | ICD-10-CM

## 2012-06-12 DIAGNOSIS — D72829 Elevated white blood cell count, unspecified: Secondary | ICD-10-CM

## 2012-06-12 DIAGNOSIS — M810 Age-related osteoporosis without current pathological fracture: Secondary | ICD-10-CM

## 2012-06-12 DIAGNOSIS — K2270 Barrett's esophagus without dysplasia: Secondary | ICD-10-CM | POA: Diagnosis not present

## 2012-06-12 DIAGNOSIS — R0602 Shortness of breath: Secondary | ICD-10-CM

## 2012-06-12 DIAGNOSIS — G4733 Obstructive sleep apnea (adult) (pediatric): Secondary | ICD-10-CM

## 2012-06-12 DIAGNOSIS — G459 Transient cerebral ischemic attack, unspecified: Secondary | ICD-10-CM

## 2012-06-12 LAB — CBC WITH DIFFERENTIAL/PLATELET
Basophils Absolute: 0.1 10*3/uL (ref 0.0–0.1)
Basophils Relative: 1.4 % (ref 0.0–3.0)
Eosinophils Absolute: 0.2 10*3/uL (ref 0.0–0.7)
Eosinophils Relative: 2.7 % (ref 0.0–5.0)
HCT: 40.9 % (ref 36.0–46.0)
Hemoglobin: 13.4 g/dL (ref 12.0–15.0)
Lymphocytes Relative: 23 % (ref 12.0–46.0)
Lymphs Abs: 1.3 10*3/uL (ref 0.7–4.0)
MCHC: 32.8 g/dL (ref 30.0–36.0)
MCV: 93 fl (ref 78.0–100.0)
Monocytes Absolute: 0.6 10*3/uL (ref 0.1–1.0)
Monocytes Relative: 10.7 % (ref 3.0–12.0)
Neutro Abs: 3.5 10*3/uL (ref 1.4–7.7)
Neutrophils Relative %: 62.2 % (ref 43.0–77.0)
Platelets: 280 10*3/uL (ref 150.0–400.0)
RBC: 4.39 Mil/uL (ref 3.87–5.11)
RDW: 13.8 % (ref 11.5–14.6)
WBC: 5.7 10*3/uL (ref 4.5–10.5)

## 2012-06-12 NOTE — Patient Instructions (Addendum)
It was nice seeing you today.  I am glad you are feeling better.  I want you to get a follow up CXR at the hospital in approximately 6-8 weeks.

## 2012-06-18 DIAGNOSIS — L97809 Non-pressure chronic ulcer of other part of unspecified lower leg with unspecified severity: Secondary | ICD-10-CM | POA: Diagnosis not present

## 2012-06-18 DIAGNOSIS — K219 Gastro-esophageal reflux disease without esophagitis: Secondary | ICD-10-CM | POA: Diagnosis not present

## 2012-06-18 DIAGNOSIS — I87309 Chronic venous hypertension (idiopathic) without complications of unspecified lower extremity: Secondary | ICD-10-CM | POA: Diagnosis not present

## 2012-06-18 DIAGNOSIS — J449 Chronic obstructive pulmonary disease, unspecified: Secondary | ICD-10-CM | POA: Diagnosis not present

## 2012-06-18 DIAGNOSIS — M810 Age-related osteoporosis without current pathological fracture: Secondary | ICD-10-CM | POA: Diagnosis not present

## 2012-06-18 DIAGNOSIS — K2270 Barrett's esophagus without dysplasia: Secondary | ICD-10-CM | POA: Diagnosis not present

## 2012-06-19 DIAGNOSIS — H409 Unspecified glaucoma: Secondary | ICD-10-CM | POA: Diagnosis not present

## 2012-06-19 DIAGNOSIS — H353190 Nonexudative age-related macular degeneration, unspecified eye, stage unspecified: Secondary | ICD-10-CM | POA: Diagnosis not present

## 2012-06-19 DIAGNOSIS — H4011X0 Primary open-angle glaucoma, stage unspecified: Secondary | ICD-10-CM | POA: Diagnosis not present

## 2012-06-19 DIAGNOSIS — H18239 Secondary corneal edema, unspecified eye: Secondary | ICD-10-CM | POA: Diagnosis not present

## 2012-06-21 DIAGNOSIS — B351 Tinea unguium: Secondary | ICD-10-CM | POA: Diagnosis not present

## 2012-06-21 DIAGNOSIS — M79609 Pain in unspecified limb: Secondary | ICD-10-CM | POA: Diagnosis not present

## 2012-06-24 ENCOUNTER — Encounter: Payer: Self-pay | Admitting: Internal Medicine

## 2012-06-24 NOTE — Assessment & Plan Note (Signed)
Low cholesterol diet.  Will follow.  

## 2012-06-24 NOTE — Assessment & Plan Note (Signed)
Reports reflux symptoms are controlled on her current regimen.  Declines further w/up.

## 2012-06-24 NOTE — Assessment & Plan Note (Signed)
Resolved

## 2012-06-24 NOTE — Assessment & Plan Note (Signed)
Needs to wear CPAP now that the acute infection symptoms have resolved.

## 2012-06-24 NOTE — Assessment & Plan Note (Signed)
Continue daily aspirin 

## 2012-06-24 NOTE — Progress Notes (Signed)
  Subjective:    Patient ID: Christy Cisneros, female    DOB: Aug 24, 1919, 78 y.o.   MRN: [id number]  HPI 78 year old female with past history of reoccurring bronchitis, hypercholesterolemia, previous TIA, osteoporosis, GERD and Barretts esophagitis.  She comes in today for a scheduled follow up.  She states she feels better.  Breathing is better.  No increased cough or congestion.  Able to take a good deep breath.  Being followed at the wound clinic for her leg.  Dressing in place.  Doing better.  Off antibiotics.    Past Medical History  Diagnosis Date  . Chronic bronchitis   . Hypercholesteremia   . GERD (gastroesophageal reflux disease)   . Barrett's esophagus with esophagitis   . Sleep apnea     has CPAP  . Glaucoma   . TIA (transient ischemic attack)   . Osteoporosis   . Osteoarthritis     Review of Systems Patient denies any headache, lightheadedness or dizziness.  No significant sinus symptoms.  No chest pain, tightness or palpitations.  No increased shortness of breath, cough or congestion.  No nausea or vomiting.  She feels her acid reflux is well controlled.  No dysphagia.  No abdominal pain or cramping.  No bowel change, such as diarrhea, constipation, BRBPR or melana.  No urine change.        Objective:   Physical Exam Filed Vitals:   06/12/12 0959  BP: 135/79  Pulse: 84  Temp: 98.1 F (3.78 C)   78 year old female in no acute distress.   HEENT:  Nares - clear.  OP- without lesions or erythema.  NECK:  Supple, nontender.  No audible bruit.   HEART:  Appears to be regular. LUNGS:  Without crackles or wheezing audible.  Respirations even and unlabored.   RADIAL PULSE:  Equal bilaterally.  ABDOMEN:  Soft, nontender.  No audible abdominal bruit.   EXTREMITIES:  No increased edema to be present.   Dressing over lower leg lesion.  No increased erythema visualized.                    Assessment & Plan:  LEG LESION.  Doing better.  Continue follow up at the Wound  Clinic.   PULMONARY.  CXR 05/24/12 revealed bibasilar atelectasis vs pneumonia.  CT chest revealed atelectasis vs pneumonia with small bilateral pleural effusions.  Discussed with her today regarding follow scanning or xray.  She did agree to a follow up xray.  Symptoms have improved.  Breathing  Back to baseline.  Will plan for follow up cxr in the near future.    CARDIOVASCULAR.  Currently asymptomatic.    HEALTH MAINTENANCE.  Physical 08/16/11.  She declines further GI evaluation.  Mammogram 11/15/11 - rec additional views.  These were performed 11/17/11 - [facility] II.

## 2012-06-24 NOTE — Assessment & Plan Note (Signed)
Declines further treatment for her bones.

## 2012-07-01 ENCOUNTER — Encounter: Payer: Self-pay | Admitting: Nurse Practitioner

## 2012-07-01 ENCOUNTER — Encounter: Payer: Self-pay | Admitting: Cardiothoracic Surgery

## 2012-07-01 DIAGNOSIS — K2270 Barrett's esophagus without dysplasia: Secondary | ICD-10-CM | POA: Diagnosis not present

## 2012-07-01 DIAGNOSIS — J449 Chronic obstructive pulmonary disease, unspecified: Secondary | ICD-10-CM | POA: Diagnosis not present

## 2012-07-01 DIAGNOSIS — K219 Gastro-esophageal reflux disease without esophagitis: Secondary | ICD-10-CM | POA: Diagnosis not present

## 2012-07-01 DIAGNOSIS — M810 Age-related osteoporosis without current pathological fracture: Secondary | ICD-10-CM | POA: Diagnosis not present

## 2012-07-01 DIAGNOSIS — L97809 Non-pressure chronic ulcer of other part of unspecified lower leg with unspecified severity: Secondary | ICD-10-CM | POA: Diagnosis not present

## 2012-07-01 DIAGNOSIS — I87309 Chronic venous hypertension (idiopathic) without complications of unspecified lower extremity: Secondary | ICD-10-CM | POA: Diagnosis not present

## 2012-07-02 DIAGNOSIS — L97809 Non-pressure chronic ulcer of other part of unspecified lower leg with unspecified severity: Secondary | ICD-10-CM | POA: Diagnosis not present

## 2012-07-02 DIAGNOSIS — J449 Chronic obstructive pulmonary disease, unspecified: Secondary | ICD-10-CM | POA: Diagnosis not present

## 2012-07-02 DIAGNOSIS — M810 Age-related osteoporosis without current pathological fracture: Secondary | ICD-10-CM | POA: Diagnosis not present

## 2012-07-02 DIAGNOSIS — K219 Gastro-esophageal reflux disease without esophagitis: Secondary | ICD-10-CM | POA: Diagnosis not present

## 2012-07-02 DIAGNOSIS — I87309 Chronic venous hypertension (idiopathic) without complications of unspecified lower extremity: Secondary | ICD-10-CM | POA: Diagnosis not present

## 2012-07-02 DIAGNOSIS — K2270 Barrett's esophagus without dysplasia: Secondary | ICD-10-CM | POA: Diagnosis not present

## 2012-07-20 ENCOUNTER — Telehealth: Payer: Self-pay | Admitting: Internal Medicine

## 2012-07-20 NOTE — Telephone Encounter (Signed)
She does need to be evaluated to confirm no active infection.  Unable to see today - I have to leave for an appt.   rec acute care for eval, then can refer to wound clinic if needed.  She is to call with update

## 2012-07-20 NOTE — Telephone Encounter (Signed)
Patient Information:  Caller Name: Lenisha  Phone: [phone]  Patient: Christy Cisneros, Christy Cisneros  Gender: Female  DOB: 1919-10-22  Age: 78 Years  PCP: [facility]  Office Follow Up:  Does the office need to follow up with this patient?: Yes  Instructions For The Office: No appointments remain for 07/20/12;   please call patient within 30 minutes to advise if will be worked in or if needs to be seen elsewhere.  RN Note:  Wound is dry with "hefty" scab. Was bright red around outside 07/19/12 but less red 07/20/12. Wound is painful only if touched.  Asking for referral to wound care center. Instructed to begin using RX antibiotic ointment.  Symptoms  Reason For Call & Symptoms: 1/2" non healing wound or right calf after stumbled into step stool  Reviewed Health History In EMR: Yes  Reviewed Medications In EMR: Yes  Reviewed Allergies In EMR: Yes  Reviewed Surgeries / Procedures: Yes  Date of Onset of Symptoms: 07/11/2012  Treatments Tried: saline wound wash  Treatments Tried Worked: No  Guideline(s) Used:  Skin Injury  Wound Infection  Disposition Per Guideline:   See Today in Office  Reason For Disposition Reached:   Patient wants to be seen  Advice Given:  Antibiotic Ointment:  Apply an antibiotic ointment 3 times a day. If the area could become dirty, cover with a Band-Aid or a clean gauze dressing.  Expected Course:  Pain and swelling normally peak on day 2. Any redness should go away by day 3 or 4. Complete healing should occur by day 10.  Call Back If:   Wound becomes more tender  Redness starts to spread  Pus, drainage, or fever occurs  You become worse

## 2012-07-20 NOTE — Telephone Encounter (Signed)
Patient called waiting on a call back .

## 2012-07-20 NOTE — Telephone Encounter (Signed)
Called patient to let her know. Patient stated that she would go to [facility], and follow-up with [place] after.

## 2012-07-23 DIAGNOSIS — Y9229 Other specified public building as the place of occurrence of the external cause: Secondary | ICD-10-CM | POA: Diagnosis not present

## 2012-07-23 DIAGNOSIS — IMO0002 Reserved for concepts with insufficient information to code with codable children: Secondary | ICD-10-CM | POA: Diagnosis not present

## 2012-07-23 DIAGNOSIS — Y939 Activity, unspecified: Secondary | ICD-10-CM | POA: Diagnosis not present

## 2012-07-26 ENCOUNTER — Encounter: Payer: Self-pay | Admitting: Nurse Practitioner

## 2012-07-26 ENCOUNTER — Encounter: Payer: Self-pay | Admitting: Cardiothoracic Surgery

## 2012-07-26 DIAGNOSIS — S81009A Unspecified open wound, unspecified knee, initial encounter: Secondary | ICD-10-CM | POA: Diagnosis not present

## 2012-08-01 ENCOUNTER — Encounter: Payer: Self-pay | Admitting: Nurse Practitioner

## 2012-08-01 ENCOUNTER — Encounter: Payer: Self-pay | Admitting: Cardiothoracic Surgery

## 2012-08-01 DIAGNOSIS — L97809 Non-pressure chronic ulcer of other part of unspecified lower leg with unspecified severity: Secondary | ICD-10-CM | POA: Diagnosis not present

## 2012-08-01 DIAGNOSIS — L97909 Non-pressure chronic ulcer of unspecified part of unspecified lower leg with unspecified severity: Secondary | ICD-10-CM | POA: Diagnosis not present

## 2012-08-01 DIAGNOSIS — K2270 Barrett's esophagus without dysplasia: Secondary | ICD-10-CM | POA: Diagnosis not present

## 2012-08-06 ENCOUNTER — Encounter: Payer: Self-pay | Admitting: Internal Medicine

## 2012-08-06 ENCOUNTER — Ambulatory Visit (INDEPENDENT_AMBULATORY_CARE_PROVIDER_SITE_OTHER): Payer: Medicare Other | Admitting: Internal Medicine

## 2012-08-06 VITALS — BP 120/70 | HR 72 | Temp 98.3°F | Ht 60.5 in | Wt 104.2 lb

## 2012-08-06 DIAGNOSIS — E7800 Pure hypercholesterolemia, unspecified: Secondary | ICD-10-CM | POA: Diagnosis not present

## 2012-08-06 DIAGNOSIS — G459 Transient cerebral ischemic attack, unspecified: Secondary | ICD-10-CM

## 2012-08-06 DIAGNOSIS — M810 Age-related osteoporosis without current pathological fracture: Secondary | ICD-10-CM

## 2012-08-06 DIAGNOSIS — K2270 Barrett's esophagus without dysplasia: Secondary | ICD-10-CM | POA: Diagnosis not present

## 2012-08-06 DIAGNOSIS — G4733 Obstructive sleep apnea (adult) (pediatric): Secondary | ICD-10-CM | POA: Diagnosis not present

## 2012-08-09 DIAGNOSIS — L97809 Non-pressure chronic ulcer of other part of unspecified lower leg with unspecified severity: Secondary | ICD-10-CM | POA: Diagnosis not present

## 2012-08-09 DIAGNOSIS — K2270 Barrett's esophagus without dysplasia: Secondary | ICD-10-CM | POA: Diagnosis not present

## 2012-08-09 DIAGNOSIS — L97909 Non-pressure chronic ulcer of unspecified part of unspecified lower leg with unspecified severity: Secondary | ICD-10-CM | POA: Diagnosis not present

## 2012-08-09 DIAGNOSIS — I87339 Chronic venous hypertension (idiopathic) with ulcer and inflammation of unspecified lower extremity: Secondary | ICD-10-CM | POA: Diagnosis not present

## 2012-08-12 ENCOUNTER — Encounter: Payer: Self-pay | Admitting: Internal Medicine

## 2012-08-12 NOTE — Assessment & Plan Note (Signed)
Doing better - symptoms.  Declines any further follow up.

## 2012-08-12 NOTE — Assessment & Plan Note (Signed)
Encouraged her to use CPAP regularly.  Questions answered.  Follow.

## 2012-08-12 NOTE — Progress Notes (Signed)
  Subjective:    Patient ID: Christy Cisneros, female    DOB: [birth year], 79 y.o.   MRN: [id number]  HPI 79 year old female with past history of chronic bronchitis, GERD and osteoporosis who comes in today for a scheduled follow up.  States she is doing well.  Denies any increased cough or congestion currently.  Discussed the need for her to wear her CPAP.  Questions answered.  Stays active.  states he reflux is better.  Bowels stable.  Eating and drinking well.     Past Medical History  Diagnosis Date  . Chronic bronchitis   . Hypercholesteremia   . GERD (gastroesophageal reflux disease)   . Barrett's esophagus with esophagitis   . Sleep apnea     has CPAP  . Glaucoma   . TIA (transient ischemic attack)   . Osteoporosis   . Osteoarthritis     Review of Systems Patient denies any headache, lightheadedness or dizziness.  No chest pain, tightness or palpitations. No increased shortness of breath, cough or congestion.  No nausea or vomiting.  Feels her reflux - doing well.  No abdominal pain or cramping.  No bowel change, such as diarrhea, constipation, BRBPR or melana.       Objective:   Physical Exam  Filed Vitals:   08/06/12 1143  BP: 120/70  Pulse: 72  Temp: 98.3 F (13.41 C)   79 year old female in no acute distress.   HEENT:  Nares - clear.  OP- without lesions or erythema.  NECK:  Supple, nontender.  No audible bruit.   HEART:  Appears to be regular. LUNGS:  Without crackles or wheezing audible.  Respirations even and unlabored.  Appears to have good breath sounds bilaterally.   RADIAL PULSE:  Equal bilaterally.  ABDOMEN:  Soft, nontender.  No audible abdominal bruit.   EXTREMITIES:  No significant increased edema to be present.                     Assessment & Plan:  PULMONARY.  Breathing appears to be doing better.  Follow.  CXR 09/21/11 revealed no evidence of acute cardiopulmonary disease.  Will need follow up CXRs from recent ER visit.  She has a rx for a follow up  cxr.    HEALTH MAINTENANCE.  After review of outside records:  Mammogram 11/15/11 rec additional views. A follow up left breast mammo and ultrasound 11/17/11 - BI-RADS II.  Last physical 08/16/11.  She declines further GI evaluation.  Schedule a physical next visit.

## 2012-08-12 NOTE — Assessment & Plan Note (Signed)
Low cholesterol diet.  Follow lipid panel.    

## 2012-08-12 NOTE — Assessment & Plan Note (Signed)
Continue calcium and vitamin D.  Declines any further medication.

## 2012-08-12 NOTE — Assessment & Plan Note (Signed)
Continue daily aspirin.  No reoccurring symptoms.   

## 2012-08-13 DIAGNOSIS — H409 Unspecified glaucoma: Secondary | ICD-10-CM | POA: Diagnosis not present

## 2012-08-13 DIAGNOSIS — H402290 Chronic angle-closure glaucoma, unspecified eye, stage unspecified: Secondary | ICD-10-CM | POA: Diagnosis not present

## 2012-08-13 DIAGNOSIS — H47239 Glaucomatous optic atrophy, unspecified eye: Secondary | ICD-10-CM | POA: Diagnosis not present

## 2012-08-13 DIAGNOSIS — H1030 Unspecified acute conjunctivitis, unspecified eye: Secondary | ICD-10-CM | POA: Diagnosis not present

## 2012-08-20 ENCOUNTER — Ambulatory Visit: Payer: Self-pay | Admitting: Internal Medicine

## 2012-08-20 DIAGNOSIS — R9389 Abnormal findings on diagnostic imaging of other specified body structures: Secondary | ICD-10-CM | POA: Diagnosis not present

## 2012-08-20 DIAGNOSIS — J449 Chronic obstructive pulmonary disease, unspecified: Secondary | ICD-10-CM | POA: Diagnosis not present

## 2012-08-20 DIAGNOSIS — S8010XA Contusion of unspecified lower leg, initial encounter: Secondary | ICD-10-CM | POA: Diagnosis not present

## 2012-08-27 DIAGNOSIS — H402290 Chronic angle-closure glaucoma, unspecified eye, stage unspecified: Secondary | ICD-10-CM | POA: Diagnosis not present

## 2012-08-27 DIAGNOSIS — H47239 Glaucomatous optic atrophy, unspecified eye: Secondary | ICD-10-CM | POA: Diagnosis not present

## 2012-08-27 DIAGNOSIS — H353190 Nonexudative age-related macular degeneration, unspecified eye, stage unspecified: Secondary | ICD-10-CM | POA: Diagnosis not present

## 2012-08-27 DIAGNOSIS — H409 Unspecified glaucoma: Secondary | ICD-10-CM | POA: Diagnosis not present

## 2012-09-03 ENCOUNTER — Encounter: Payer: Self-pay | Admitting: Internal Medicine

## 2012-09-10 DIAGNOSIS — J018 Other acute sinusitis: Secondary | ICD-10-CM | POA: Diagnosis not present

## 2012-09-10 DIAGNOSIS — H6120 Impacted cerumen, unspecified ear: Secondary | ICD-10-CM | POA: Diagnosis not present

## 2012-09-10 DIAGNOSIS — H903 Sensorineural hearing loss, bilateral: Secondary | ICD-10-CM | POA: Diagnosis not present

## 2012-09-10 DIAGNOSIS — R0982 Postnasal drip: Secondary | ICD-10-CM | POA: Diagnosis not present

## 2012-09-20 DIAGNOSIS — L820 Inflamed seborrheic keratosis: Secondary | ICD-10-CM | POA: Diagnosis not present

## 2012-09-20 DIAGNOSIS — L819 Disorder of pigmentation, unspecified: Secondary | ICD-10-CM | POA: Diagnosis not present

## 2012-09-20 DIAGNOSIS — L299 Pruritus, unspecified: Secondary | ICD-10-CM | POA: Diagnosis not present

## 2012-09-24 DIAGNOSIS — H04129 Dry eye syndrome of unspecified lacrimal gland: Secondary | ICD-10-CM | POA: Diagnosis not present

## 2012-09-24 DIAGNOSIS — H47239 Glaucomatous optic atrophy, unspecified eye: Secondary | ICD-10-CM | POA: Diagnosis not present

## 2012-09-24 DIAGNOSIS — H1030 Unspecified acute conjunctivitis, unspecified eye: Secondary | ICD-10-CM | POA: Diagnosis not present

## 2012-09-24 DIAGNOSIS — H402290 Chronic angle-closure glaucoma, unspecified eye, stage unspecified: Secondary | ICD-10-CM | POA: Diagnosis not present

## 2012-10-29 DIAGNOSIS — H4011X0 Primary open-angle glaucoma, stage unspecified: Secondary | ICD-10-CM | POA: Diagnosis not present

## 2012-10-29 DIAGNOSIS — H47239 Glaucomatous optic atrophy, unspecified eye: Secondary | ICD-10-CM | POA: Diagnosis not present

## 2012-10-29 DIAGNOSIS — H04129 Dry eye syndrome of unspecified lacrimal gland: Secondary | ICD-10-CM | POA: Diagnosis not present

## 2012-10-29 DIAGNOSIS — H409 Unspecified glaucoma: Secondary | ICD-10-CM | POA: Diagnosis not present

## 2012-10-31 DIAGNOSIS — J029 Acute pharyngitis, unspecified: Secondary | ICD-10-CM | POA: Diagnosis not present

## 2012-10-31 DIAGNOSIS — R05 Cough: Secondary | ICD-10-CM | POA: Diagnosis not present

## 2012-10-31 DIAGNOSIS — R059 Cough, unspecified: Secondary | ICD-10-CM | POA: Diagnosis not present

## 2012-11-01 ENCOUNTER — Ambulatory Visit (INDEPENDENT_AMBULATORY_CARE_PROVIDER_SITE_OTHER): Payer: Medicare Other | Admitting: Adult Health

## 2012-11-01 ENCOUNTER — Encounter: Payer: Self-pay | Admitting: Adult Health

## 2012-11-01 VITALS — BP 138/78 | HR 74 | Temp 97.8°F | Resp 14 | Ht 65.0 in | Wt 104.0 lb

## 2012-11-01 DIAGNOSIS — J209 Acute bronchitis, unspecified: Secondary | ICD-10-CM | POA: Diagnosis not present

## 2012-11-01 NOTE — Assessment & Plan Note (Signed)
Patient is currently taking Cefdinir 300 mg bid x 7 days. She started this yesterday (10/31/12). It has not been long enough for her to feel any improvement. Lungs are clear, HR reg, strong. Advised to RTC if no improvement within 3-4 days.

## 2012-11-01 NOTE — Progress Notes (Signed)
  Subjective:    Patient ID: Christy Cisneros, female    DOB: 1920/03/23, 79 y.o.   MRN: [id number]  HPI  Patient is a pleasant 79 y/o female who presents to clinic with upper respiratory symptoms and cough. She was just seen at an urgent care center yesterday and started on Cefdinir bid x 7 days.  Review of Systems  Constitutional: Negative for fever and chills.  HENT: Positive for congestion, rhinorrhea and postnasal drip.   Respiratory: Positive for cough.   Gastrointestinal: Negative for nausea and diarrhea.       Objective:   Physical Exam  Constitutional: She is oriented to person, place, and time. She appears well-developed and well-nourished. No distress.  HENT:  Head: Normocephalic and atraumatic.  Pharyngeal erythema  Cardiovascular: Normal rate, regular rhythm and normal heart sounds.   Pulmonary/Chest: Effort normal and breath sounds normal. No respiratory distress. She has no wheezes. She has no rales.  Lymphadenopathy:    She has no cervical adenopathy.  Neurological: She is alert and oriented to person, place, and time.  Skin: Skin is warm and dry.  Psychiatric: She has a normal mood and affect. Her behavior is normal. Judgment and thought content normal.          Assessment & Plan:

## 2012-11-07 DIAGNOSIS — H6120 Impacted cerumen, unspecified ear: Secondary | ICD-10-CM | POA: Diagnosis not present

## 2012-11-07 DIAGNOSIS — H903 Sensorineural hearing loss, bilateral: Secondary | ICD-10-CM | POA: Diagnosis not present

## 2012-11-07 DIAGNOSIS — H60509 Unspecified acute noninfective otitis externa, unspecified ear: Secondary | ICD-10-CM | POA: Diagnosis not present

## 2012-11-21 DIAGNOSIS — H6120 Impacted cerumen, unspecified ear: Secondary | ICD-10-CM | POA: Diagnosis not present

## 2012-11-21 DIAGNOSIS — H903 Sensorineural hearing loss, bilateral: Secondary | ICD-10-CM | POA: Diagnosis not present

## 2012-12-06 ENCOUNTER — Observation Stay: Payer: Self-pay | Admitting: Internal Medicine

## 2012-12-06 DIAGNOSIS — I10 Essential (primary) hypertension: Secondary | ICD-10-CM | POA: Diagnosis not present

## 2012-12-06 DIAGNOSIS — H409 Unspecified glaucoma: Secondary | ICD-10-CM | POA: Diagnosis not present

## 2012-12-06 DIAGNOSIS — Z66 Do not resuscitate: Secondary | ICD-10-CM | POA: Diagnosis not present

## 2012-12-06 DIAGNOSIS — Z7982 Long term (current) use of aspirin: Secondary | ICD-10-CM | POA: Diagnosis not present

## 2012-12-06 DIAGNOSIS — R269 Unspecified abnormalities of gait and mobility: Secondary | ICD-10-CM | POA: Diagnosis not present

## 2012-12-06 DIAGNOSIS — R112 Nausea with vomiting, unspecified: Secondary | ICD-10-CM | POA: Diagnosis not present

## 2012-12-06 DIAGNOSIS — H3530 Unspecified macular degeneration: Secondary | ICD-10-CM | POA: Diagnosis not present

## 2012-12-06 DIAGNOSIS — I6350 Cerebral infarction due to unspecified occlusion or stenosis of unspecified cerebral artery: Secondary | ICD-10-CM | POA: Diagnosis not present

## 2012-12-06 DIAGNOSIS — R6889 Other general symptoms and signs: Secondary | ICD-10-CM | POA: Diagnosis not present

## 2012-12-06 DIAGNOSIS — R42 Dizziness and giddiness: Secondary | ICD-10-CM | POA: Diagnosis not present

## 2012-12-06 DIAGNOSIS — G459 Transient cerebral ischemic attack, unspecified: Secondary | ICD-10-CM | POA: Diagnosis not present

## 2012-12-06 DIAGNOSIS — Z79899 Other long term (current) drug therapy: Secondary | ICD-10-CM | POA: Diagnosis not present

## 2012-12-06 DIAGNOSIS — K219 Gastro-esophageal reflux disease without esophagitis: Secondary | ICD-10-CM | POA: Diagnosis not present

## 2012-12-06 LAB — CBC
MCV: 90 fL (ref 80–100)
RBC: 4.58 10*6/uL (ref 3.80–5.20)
RDW: 14.3 % (ref 11.5–14.5)
WBC: 6.8 10*3/uL (ref 3.6–11.0)

## 2012-12-06 LAB — URINALYSIS, COMPLETE
Bacteria: NONE SEEN
Ph: 8 (ref 4.5–8.0)
RBC,UR: NONE SEEN /HPF (ref 0–5)
Squamous Epithelial: NONE SEEN
WBC UR: 1 /HPF (ref 0–5)

## 2012-12-06 LAB — COMPREHENSIVE METABOLIC PANEL
Albumin: 3.8 g/dL (ref 3.4–5.0)
Alkaline Phosphatase: 75 U/L (ref 50–136)
Co2: 28 mmol/L (ref 21–32)
Creatinine: 0.58 mg/dL — ABNORMAL LOW (ref 0.60–1.30)
EGFR (African American): >60
EGFR (Non-African Amer.): >60
Glucose: 121 mg/dL — ABNORMAL HIGH (ref 65–99)
Osmolality: 276 (ref 275–301)
Potassium: 3.9 mmol/L (ref 3.5–5.1)
Sodium: 137 mmol/L (ref 136–145)
Total Protein: 7 g/dL (ref 6.4–8.2)

## 2012-12-06 LAB — TROPONIN I: Troponin-I: <0.02 ng/mL

## 2012-12-07 DIAGNOSIS — R269 Unspecified abnormalities of gait and mobility: Secondary | ICD-10-CM | POA: Diagnosis not present

## 2012-12-07 DIAGNOSIS — H3530 Unspecified macular degeneration: Secondary | ICD-10-CM | POA: Diagnosis not present

## 2012-12-07 DIAGNOSIS — I059 Rheumatic mitral valve disease, unspecified: Secondary | ICD-10-CM | POA: Diagnosis not present

## 2012-12-07 DIAGNOSIS — H409 Unspecified glaucoma: Secondary | ICD-10-CM | POA: Diagnosis not present

## 2012-12-07 DIAGNOSIS — R42 Dizziness and giddiness: Secondary | ICD-10-CM | POA: Diagnosis not present

## 2012-12-07 LAB — BASIC METABOLIC PANEL
BUN: 16 mg/dL (ref 7–18)
Calcium, Total: 8.5 mg/dL (ref 8.5–10.1)
Chloride: 109 mmol/L — ABNORMAL HIGH (ref 98–107)
Co2: 29 mmol/L (ref 21–32)
EGFR (African American): >60
EGFR (Non-African Amer.): >60
Osmolality: 284 (ref 275–301)
Potassium: 3.9 mmol/L (ref 3.5–5.1)

## 2012-12-07 LAB — CBC WITH DIFFERENTIAL/PLATELET
Eosinophil #: 0.2 10*3/uL (ref 0.0–0.7)
Lymphocyte #: 1.6 10*3/uL (ref 1.0–3.6)
Lymphocyte %: 27.9 %
MCH: 30.5 pg (ref 26.0–34.0)
MCV: 91 fL (ref 80–100)
Monocyte #: 0.6 x10 3/mm (ref 0.2–0.9)
Neutrophil #: 3.3 10*3/uL (ref 1.4–6.5)
RBC: 4.05 10*6/uL (ref 3.80–5.20)
RDW: 14.3 % (ref 11.5–14.5)

## 2012-12-07 LAB — LIPID PANEL: Triglycerides: 62 mg/dL (ref 0–200)

## 2012-12-08 LAB — URINE CULTURE

## 2012-12-10 ENCOUNTER — Ambulatory Visit (INDEPENDENT_AMBULATORY_CARE_PROVIDER_SITE_OTHER): Payer: Medicare Other | Admitting: Internal Medicine

## 2012-12-10 ENCOUNTER — Encounter: Payer: Self-pay | Admitting: Internal Medicine

## 2012-12-10 VITALS — BP 120/80 | HR 80 | Temp 97.6°F | Ht 60.5 in | Wt 101.8 lb

## 2012-12-10 DIAGNOSIS — E7800 Pure hypercholesterolemia, unspecified: Secondary | ICD-10-CM

## 2012-12-10 DIAGNOSIS — G459 Transient cerebral ischemic attack, unspecified: Secondary | ICD-10-CM | POA: Diagnosis not present

## 2012-12-10 DIAGNOSIS — G4733 Obstructive sleep apnea (adult) (pediatric): Secondary | ICD-10-CM

## 2012-12-10 DIAGNOSIS — K2270 Barrett's esophagus without dysplasia: Secondary | ICD-10-CM

## 2012-12-10 DIAGNOSIS — H409 Unspecified glaucoma: Secondary | ICD-10-CM

## 2012-12-10 DIAGNOSIS — H539 Unspecified visual disturbance: Secondary | ICD-10-CM

## 2012-12-11 ENCOUNTER — Encounter: Payer: Self-pay | Admitting: Internal Medicine

## 2012-12-11 NOTE — Assessment & Plan Note (Signed)
Concern regarding reoccurring TIAs.  Had the episode of dizziness last week.  Acute blurred vision yesterday pm.  Has resolved now.  Discussed with her regarding observation in the hospital.  She declines.  Discussed the need for further w/up.  She is agreeable to MRI (brain) and carotid ultrasound - if not performed while pt in the hospital.  Discussed changing her antiplatelet therapy.  She declines.  Will continue aspirin daily.  Declines referral to neurology.  Explained to her if she had any reoccurring symptoms or problems - she was to be evaluated immediately.

## 2012-12-11 NOTE — Assessment & Plan Note (Signed)
Doing better - symptoms.  Declines any further follow up.      

## 2012-12-11 NOTE — Assessment & Plan Note (Signed)
Low cholesterol diet.  Will follow.  She declines cholesterol medication.   

## 2012-12-11 NOTE — Progress Notes (Signed)
Subjective:    Patient ID: Christy Cisneros, female    DOB: 24-Oct-1919, 79 y.o.   MRN: [id number]  HPI 79 year old female with past history of chronic bronchitis, GERD and osteoporosis who comes in today for a hospital follow up and for her complete physical exam.   States she is doing better.  Was admitted last week with acute dizziness.  States she experienced room spinning and dizziness.  To ER.  Admitted for observation.  She is not sure what w/up was done.  Did state that PT evaluated her and felt her balance was good.  She was discharged the following day and has been doing relatively well, until yesterday evening.  States she experienced acute blurred vision.  Lasted approximately 6 hours.  States involved both eyes.  No other neurological symptoms.  No slurred speech.  No headache.  No eye pain.  No chest pain or tightness.   Denies any increased cough or congestion.  Discussed the need for her to wear her CPAP.  Questions answered.  Stays active.  No significant problems with reflux.  Bowels stable.  Eating and drinking well.  No blurred vision now.  Back to her baseline.     Past Medical History  Diagnosis Date  . Chronic bronchitis   . Hypercholesteremia   . GERD (gastroesophageal reflux disease)   . Barrett's esophagus with esophagitis   . Sleep apnea     has CPAP  . Glaucoma   . TIA (transient ischemic attack)   . Osteoporosis   . Osteoarthritis     Current Outpatient Prescriptions on File Prior to Visit  Medication Sig Dispense Refill  . aspirin 81 MG tablet Take 81 mg by mouth daily.      . Bilberry, Vaccinium myrtillus, (BILBERRY PO) Take 1 capsule by mouth daily.      . calcium carbonate (OS-CAL) 600 MG TABS Take 600 mg by mouth daily.      . Multiple Vitamins-Minerals (PRESERVISION AREDS 2 PO) Take by mouth 2 (two) times daily.      Marland Kitchen omeprazole (PRILOSEC) 20 MG capsule Take 20 mg by mouth daily.      . prednisoLONE acetate (PRED FORTE) 1 % ophthalmic suspension Place  1 drop into the left eye 2 (two) times daily.      . timolol (BETIMOL) 0.5 % ophthalmic solution Place 1 drop into the right eye daily.       No current facility-administered medications on file prior to visit.    Review of Systems Patient denies any headache.  No dizziness since her discharge.  Blurred vision last pm.  No slurring of speech or other neurological symptoms.   No chest pain, tightness or palpitations. No increased shortness of breath, cough or congestion.  No nausea or vomiting.  Feels her reflux - doing well.  No abdominal pain or cramping.  No bowel change, such as diarrhea, constipation, BRBPR or melana.       Objective:   Physical Exam  Filed Vitals:   12/10/12 1034  BP: 120/80  Pulse: 80  Temp: 97.6 F (36.4 C)   Blood pressure recheck:  138/78, pulse 85  79 year old female in no acute distress.   HEENT:  Nares- clear.  Oropharynx - without lesions. NECK:  Supple.  Nontender.  No audible bruit.  HEART:  Appears to be regular. LUNGS:  No crackles or wheezing audible.  Respirations even and unlabored.  RADIAL PULSE:  Equal bilaterally.    BREASTS:  No nipple discharge or nipple retraction present.  Could not appreciate any distinct nodules or axillary adenopathy.  ABDOMEN:  Soft, nontender.  Bowel sounds present and normal.  No audible abdominal bruit.  GU:  She declined.   RECTAL:  She declined.    EXTREMITIES:  No increased edema present.  DP pulses palpable and equal bilaterally.             Assessment & Plan:  PULMONARY.  Breathing appears to be doing better.   1/14 f/u xray revealed no acute cardiopulmonary disease.    HEALTH MAINTENANCE.  After review of outside records:  Mammogram 11/15/11 rec additional views. A follow up left breast mammo and ultrasound 11/17/11 - BI-RADS II.  Last physical 08/16/11.  She declines further GI evaluation.  Physical today.  Declines pelvic exam.  Declines mammogram.

## 2012-12-11 NOTE — Assessment & Plan Note (Signed)
Encouraged her to use CPAP regularly.  Questions answered.  Follow.  

## 2012-12-11 NOTE — Assessment & Plan Note (Signed)
Had the blurred vision as outlined.  Work up planned as outlined.  No pain in the eyes.  Instructed on need for opthalmology evaluation.

## 2012-12-20 DIAGNOSIS — B351 Tinea unguium: Secondary | ICD-10-CM | POA: Diagnosis not present

## 2012-12-20 DIAGNOSIS — M79609 Pain in unspecified limb: Secondary | ICD-10-CM | POA: Diagnosis not present

## 2012-12-31 DIAGNOSIS — H04129 Dry eye syndrome of unspecified lacrimal gland: Secondary | ICD-10-CM | POA: Diagnosis not present

## 2012-12-31 DIAGNOSIS — H402290 Chronic angle-closure glaucoma, unspecified eye, stage unspecified: Secondary | ICD-10-CM | POA: Diagnosis not present

## 2012-12-31 DIAGNOSIS — H409 Unspecified glaucoma: Secondary | ICD-10-CM | POA: Diagnosis not present

## 2012-12-31 DIAGNOSIS — H47239 Glaucomatous optic atrophy, unspecified eye: Secondary | ICD-10-CM | POA: Diagnosis not present

## 2013-01-10 ENCOUNTER — Encounter: Payer: Self-pay | Admitting: Internal Medicine

## 2013-01-10 ENCOUNTER — Ambulatory Visit (INDEPENDENT_AMBULATORY_CARE_PROVIDER_SITE_OTHER): Payer: Medicare Other | Admitting: Internal Medicine

## 2013-01-10 VITALS — BP 130/70 | HR 78 | Temp 97.7°F | Ht 60.5 in | Wt 101.5 lb

## 2013-01-10 DIAGNOSIS — M1990 Unspecified osteoarthritis, unspecified site: Secondary | ICD-10-CM

## 2013-01-10 DIAGNOSIS — E7800 Pure hypercholesterolemia, unspecified: Secondary | ICD-10-CM

## 2013-01-10 DIAGNOSIS — M810 Age-related osteoporosis without current pathological fracture: Secondary | ICD-10-CM | POA: Diagnosis not present

## 2013-01-10 DIAGNOSIS — G459 Transient cerebral ischemic attack, unspecified: Secondary | ICD-10-CM

## 2013-01-10 DIAGNOSIS — M549 Dorsalgia, unspecified: Secondary | ICD-10-CM | POA: Diagnosis not present

## 2013-01-10 DIAGNOSIS — G4733 Obstructive sleep apnea (adult) (pediatric): Secondary | ICD-10-CM

## 2013-01-10 DIAGNOSIS — K2270 Barrett's esophagus without dysplasia: Secondary | ICD-10-CM

## 2013-01-10 DIAGNOSIS — H409 Unspecified glaucoma: Secondary | ICD-10-CM

## 2013-01-20 ENCOUNTER — Encounter: Payer: Self-pay | Admitting: Internal Medicine

## 2013-01-20 NOTE — Assessment & Plan Note (Signed)
Encouraged her to use CPAP regularly.  Questions answered.  Follow.

## 2013-01-20 NOTE — Assessment & Plan Note (Signed)
Some low back pain as outlined.  Check L-S spine xray.  Follow.

## 2013-01-20 NOTE — Assessment & Plan Note (Addendum)
Followed by opthalmology.  Was planning for an elective eye surgery soon.  Will postpone.  Follow.

## 2013-01-20 NOTE — Assessment & Plan Note (Signed)
Continue calcium and vitamin D.  Declines any further medication.

## 2013-01-20 NOTE — Assessment & Plan Note (Signed)
Low cholesterol diet.  Will follow.  She declines cholesterol medication.   

## 2013-01-20 NOTE — Assessment & Plan Note (Signed)
Concern regarding reoccurring TIAs.  Had the episode of dizziness prior to last visit.  Work up in the hospital included MRI and carotid ultrasound.  No acute abnormality found.  Taking daily aspirin.  No reoccurring symptoms.  Desires no further w/up, testing or evaluation.

## 2013-01-20 NOTE — Progress Notes (Signed)
Subjective:    Patient ID: Christy Cisneros, female    DOB: 03/15/1920, 79 y.o.   MRN: [id number]  HPI 79 year old female with past history of chronic bronchitis, GERD and osteoporosis who comes in today for a scheduled follow up.  States she is doing better.  Was admitted prior to last office visit with acute dizziness.  Admitted for observation.  Work up included carotid ultrasound and MRI.  No acute abnormality.  Since her discharge, she has done well.  States she has not had any reoccurring problems since her last visit with me.  Energy is better.  She has had some issues with her low back.  Some pain.  Discussed xray.  Discussed stretches.  No pain going down the leg.  Stays active.  Discussed CPAP and the need to use CPAP regularly.  Was planning to have left eye surgery soon.  Discussed the need to postpone the surgery, given the recent concern regarding a possible TIA.     Past Medical History  Diagnosis Date  . Chronic bronchitis   . Hypercholesteremia   . GERD (gastroesophageal reflux disease)   . Barrett's esophagus with esophagitis   . Sleep apnea     has CPAP  . Glaucoma   . TIA (transient ischemic attack)   . Osteoporosis   . Osteoarthritis     Current Outpatient Prescriptions on File Prior to Visit  Medication Sig Dispense Refill  . aspirin 81 MG tablet Take 81 mg by mouth daily.      . Bilberry, Vaccinium myrtillus, (BILBERRY PO) Take 1 capsule by mouth daily.      . calcium carbonate (OS-CAL) 600 MG TABS Take 600 mg by mouth daily.      . Multiple Vitamins-Minerals (PRESERVISION AREDS 2 PO) Take by mouth 2 (two) times daily.      Marland Kitchen omeprazole (PRILOSEC) 20 MG capsule Take 20 mg by mouth daily.      . prednisoLONE acetate (PRED FORTE) 1 % ophthalmic suspension Place 1 drop into the left eye 2 (two) times daily.      . timolol (BETIMOL) 0.5 % ophthalmic solution Place 1 drop into the right eye daily.       No current facility-administered medications on file prior to  visit.    Review of Systems Patient denies any headache.  No dizziness since her discharge.  No slurring of speech or other neurological symptoms.   No further vision changes.  No chest pain, tightness or palpitations. No increased shortness of breath, cough or congestion.  No nausea or vomiting.  Feels her reflux - doing well.  No abdominal pain or cramping.  No bowel change, such as diarrhea, constipation, BRBPR or melana.  Overall doing better.  Some low back pain.       Objective:   Physical Exam  Filed Vitals:   01/10/13 1021  BP: 130/70  Pulse: 78  Temp: 97.7 F (58.12 C)   79 year old female in no acute distress.   HEENT:  Nares- clear.  Oropharynx - without lesions. NECK:  Supple.  Nontender.  No audible bruit.  HEART:  Appears to be regular. LUNGS:  No crackles or wheezing audible.  Respirations even and unlabored.  RADIAL PULSE:  Equal bilaterally.   ABDOMEN:  Soft, nontender.  Bowel sounds present and normal.  No audible abdominal bruit.    EXTREMITIES:  No increased edema present.  DP pulses palpable and equal bilaterally.  Assessment & Plan:  PULMONARY.  Breathing appears to be doing better.   1/14 f/u xray revealed no acute cardiopulmonary disease.   BACK PAIN.  Persistent.  Discussed stretches.  Check L-S spine xray.  Follow.    HEALTH MAINTENANCE.  After review of outside records:  Mammogram 11/15/11 rec additional views. A follow up left breast mammo and ultrasound 11/17/11 - BI-RADS II.  She declines further GI evaluation.  Declines pelvic exam.  Declines mammogram.  Physical last visit.

## 2013-01-20 NOTE — Assessment & Plan Note (Signed)
Doing better - symptoms.  Declines any further follow up.

## 2013-01-29 ENCOUNTER — Ambulatory Visit (INDEPENDENT_AMBULATORY_CARE_PROVIDER_SITE_OTHER)
Admission: RE | Admit: 2013-01-29 | Discharge: 2013-01-29 | Disposition: A | Discharge status: Home / Self Care | Payer: Medicare Other | Source: Ambulatory Visit | Attending: Internal Medicine | Admitting: Internal Medicine

## 2013-01-29 DIAGNOSIS — M549 Dorsalgia, unspecified: Secondary | ICD-10-CM | POA: Diagnosis not present

## 2013-01-29 DIAGNOSIS — M47817 Spondylosis without myelopathy or radiculopathy, lumbosacral region: Secondary | ICD-10-CM | POA: Diagnosis not present

## 2013-01-30 NOTE — Progress Notes (Signed)
LMTCB

## 2013-02-04 ENCOUNTER — Telehealth: Payer: Self-pay | Admitting: Internal Medicine

## 2013-02-04 DIAGNOSIS — M549 Dorsalgia, unspecified: Secondary | ICD-10-CM

## 2013-02-04 NOTE — Telephone Encounter (Signed)
Pt is calling and wanted to know if she could use Nani Gasser with [facility] physical Therapy.

## 2013-02-05 NOTE — Telephone Encounter (Signed)
I placed order for physical therapy and made request for stacey simpson - [facility] physical therapy.

## 2013-02-14 ENCOUNTER — Encounter: Payer: Self-pay | Admitting: Internal Medicine

## 2013-02-14 DIAGNOSIS — M545 Low back pain, unspecified: Secondary | ICD-10-CM | POA: Diagnosis not present

## 2013-02-14 DIAGNOSIS — IMO0001 Reserved for inherently not codable concepts without codable children: Secondary | ICD-10-CM | POA: Diagnosis not present

## 2013-02-14 DIAGNOSIS — M6281 Muscle weakness (generalized): Secondary | ICD-10-CM | POA: Diagnosis not present

## 2013-02-18 DIAGNOSIS — H903 Sensorineural hearing loss, bilateral: Secondary | ICD-10-CM | POA: Diagnosis not present

## 2013-02-18 DIAGNOSIS — H6120 Impacted cerumen, unspecified ear: Secondary | ICD-10-CM | POA: Diagnosis not present

## 2013-02-21 DIAGNOSIS — H47239 Glaucomatous optic atrophy, unspecified eye: Secondary | ICD-10-CM | POA: Diagnosis not present

## 2013-02-21 DIAGNOSIS — H10509 Unspecified blepharoconjunctivitis, unspecified eye: Secondary | ICD-10-CM | POA: Diagnosis not present

## 2013-02-21 DIAGNOSIS — H402290 Chronic angle-closure glaucoma, unspecified eye, stage unspecified: Secondary | ICD-10-CM | POA: Diagnosis not present

## 2013-02-21 DIAGNOSIS — H04129 Dry eye syndrome of unspecified lacrimal gland: Secondary | ICD-10-CM | POA: Diagnosis not present

## 2013-02-28 DIAGNOSIS — H109 Unspecified conjunctivitis: Secondary | ICD-10-CM | POA: Diagnosis not present

## 2013-02-28 DIAGNOSIS — H47239 Glaucomatous optic atrophy, unspecified eye: Secondary | ICD-10-CM | POA: Diagnosis not present

## 2013-02-28 DIAGNOSIS — H04129 Dry eye syndrome of unspecified lacrimal gland: Secondary | ICD-10-CM | POA: Diagnosis not present

## 2013-02-28 DIAGNOSIS — H402290 Chronic angle-closure glaucoma, unspecified eye, stage unspecified: Secondary | ICD-10-CM | POA: Diagnosis not present

## 2013-03-01 ENCOUNTER — Encounter: Payer: Self-pay | Admitting: Internal Medicine

## 2013-03-01 DIAGNOSIS — IMO0001 Reserved for inherently not codable concepts without codable children: Secondary | ICD-10-CM | POA: Diagnosis not present

## 2013-03-01 DIAGNOSIS — M545 Low back pain, unspecified: Secondary | ICD-10-CM | POA: Diagnosis not present

## 2013-03-01 DIAGNOSIS — M6281 Muscle weakness (generalized): Secondary | ICD-10-CM | POA: Diagnosis not present

## 2013-03-07 DIAGNOSIS — H47239 Glaucomatous optic atrophy, unspecified eye: Secondary | ICD-10-CM | POA: Diagnosis not present

## 2013-03-07 DIAGNOSIS — H04129 Dry eye syndrome of unspecified lacrimal gland: Secondary | ICD-10-CM | POA: Diagnosis not present

## 2013-03-07 DIAGNOSIS — H01009 Unspecified blepharitis unspecified eye, unspecified eyelid: Secondary | ICD-10-CM | POA: Diagnosis not present

## 2013-03-07 DIAGNOSIS — H402290 Chronic angle-closure glaucoma, unspecified eye, stage unspecified: Secondary | ICD-10-CM | POA: Diagnosis not present

## 2013-03-08 DIAGNOSIS — M898X9 Other specified disorders of bone, unspecified site: Secondary | ICD-10-CM | POA: Diagnosis not present

## 2013-03-14 DIAGNOSIS — H47239 Glaucomatous optic atrophy, unspecified eye: Secondary | ICD-10-CM | POA: Diagnosis not present

## 2013-03-14 DIAGNOSIS — H04129 Dry eye syndrome of unspecified lacrimal gland: Secondary | ICD-10-CM | POA: Diagnosis not present

## 2013-03-14 DIAGNOSIS — H402290 Chronic angle-closure glaucoma, unspecified eye, stage unspecified: Secondary | ICD-10-CM | POA: Diagnosis not present

## 2013-03-14 DIAGNOSIS — H01009 Unspecified blepharitis unspecified eye, unspecified eyelid: Secondary | ICD-10-CM | POA: Diagnosis not present

## 2013-03-15 ENCOUNTER — Encounter: Payer: Self-pay | Admitting: Internal Medicine

## 2013-03-15 ENCOUNTER — Ambulatory Visit (INDEPENDENT_AMBULATORY_CARE_PROVIDER_SITE_OTHER): Payer: Medicare Other | Admitting: Internal Medicine

## 2013-03-15 VITALS — BP 120/80 | HR 114 | Temp 98.2°F | Ht 60.5 in | Wt 100.5 lb

## 2013-03-15 DIAGNOSIS — H409 Unspecified glaucoma: Secondary | ICD-10-CM

## 2013-03-15 DIAGNOSIS — G459 Transient cerebral ischemic attack, unspecified: Secondary | ICD-10-CM

## 2013-03-15 DIAGNOSIS — K2270 Barrett's esophagus without dysplasia: Secondary | ICD-10-CM

## 2013-03-15 DIAGNOSIS — E7800 Pure hypercholesterolemia, unspecified: Secondary | ICD-10-CM

## 2013-03-15 DIAGNOSIS — G4733 Obstructive sleep apnea (adult) (pediatric): Secondary | ICD-10-CM | POA: Diagnosis not present

## 2013-03-15 DIAGNOSIS — M1990 Unspecified osteoarthritis, unspecified site: Secondary | ICD-10-CM | POA: Diagnosis not present

## 2013-03-15 DIAGNOSIS — M810 Age-related osteoporosis without current pathological fracture: Secondary | ICD-10-CM | POA: Diagnosis not present

## 2013-03-17 ENCOUNTER — Encounter: Payer: Self-pay | Admitting: Internal Medicine

## 2013-03-17 NOTE — Progress Notes (Signed)
Subjective:    Patient ID: Christy Cisneros, female    DOB: January 26, 1920, 79 y.o.   MRN: [id number]  HPI 79 year old female with past history of chronic bronchitis, GERD and osteoporosis who comes in today for a scheduled follow up.  States she is doing better.  Was admitted previously with acute dizziness.  Admitted for observation.  Work up included carotid ultrasound and MRI.  No acute abnormality.  Since her discharge, she has done well.  States she has not had any reoccurring problems since her last visit with me.  Energy is better.   Stays active.  Overall doing better.  Walking daily.    Past Medical History  Diagnosis Date  . Chronic bronchitis   . Hypercholesteremia   . GERD (gastroesophageal reflux disease)   . Barrett's esophagus with esophagitis   . Sleep apnea     has CPAP  . Glaucoma   . TIA (transient ischemic attack)   . Osteoporosis   . Osteoarthritis     Current Outpatient Prescriptions on File Prior to Visit  Medication Sig Dispense Refill  . aspirin 81 MG tablet Take 81 mg by mouth daily.      . Bilberry, Vaccinium myrtillus, (BILBERRY PO) Take 1 capsule by mouth daily.      . calcium carbonate (OS-CAL) 600 MG TABS Take 600 mg by mouth daily.      . Multiple Vitamins-Minerals (PRESERVISION AREDS 2 PO) Take by mouth 2 (two) times daily.      Marland Kitchen omeprazole (PRILOSEC) 20 MG capsule Take 20 mg by mouth daily.      . prednisoLONE acetate (PRED FORTE) 1 % ophthalmic suspension Place 1 drop into the left eye 2 (two) times daily.      . timolol (BETIMOL) 0.5 % ophthalmic solution Place 1 drop into the right eye daily.       No current facility-administered medications on file prior to visit.    Review of Systems Patient denies any headache.  No dizziness since her discharge.  No slurring of speech or other neurological symptoms.   No further vision changes.  No chest pain, tightness or palpitations. No increased shortness of breath, cough or congestion.  No nausea or  vomiting.  Feels her reflux - doing well.  No abdominal pain or cramping.  No bowel change, such as diarrhea, constipation, BRBPR or melana.  Overall doing better.  Did therapy for her back.  No reported problems with her back.       Objective:   Physical Exam  Filed Vitals:   03/15/13 1135  BP: 120/80  Pulse: 114  Temp: 98.2 F (33.41 C)   79 year old female in no acute distress.   HEENT:  Nares- clear.  Oropharynx - without lesions. NECK:  Supple.  Nontender.  No audible bruit.  HEART:  Appears to be regular. LUNGS:  No crackles or wheezing audible.  Respirations even and unlabored.  RADIAL PULSE:  Equal bilaterally.   ABDOMEN:  Soft, nontender.  Bowel sounds present and normal.  No audible abdominal bruit.    EXTREMITIES:  No increased edema present.  DP pulses palpable and equal bilaterally.             Assessment & Plan:  PULMONARY.  Breathing appears to be doing better.   1/14 f/u xray revealed no acute cardiopulmonary disease.   BACK PAIN.  No reported problems today.  Continue stretches.     HEALTH MAINTENANCE.  After review of outside records:  Mammogram 11/15/11 rec additional views. A follow up left breast mammo and ultrasound 11/17/11 - BI-RADS II.  She declines further GI evaluation.  Declines pelvic exam.  Declines mammogram.  Physical 12/10/12.

## 2013-03-17 NOTE — Assessment & Plan Note (Signed)
Low cholesterol diet.  Will follow.  She declines cholesterol medication.   

## 2013-03-17 NOTE — Assessment & Plan Note (Signed)
Followed by opthalmology.  Was planning for an elective eye surgery soon.  Will postpone.  Follow.

## 2013-03-17 NOTE — Assessment & Plan Note (Signed)
Continue calcium and vitamin D.  Declines any further medication.

## 2013-03-17 NOTE — Assessment & Plan Note (Signed)
Continue CPAP.  

## 2013-03-17 NOTE — Assessment & Plan Note (Addendum)
Appears to be doing better.  Continue exercise.     

## 2013-03-17 NOTE — Assessment & Plan Note (Signed)
Doing better - symptoms.  Declines any further follow up.

## 2013-03-17 NOTE — Assessment & Plan Note (Signed)
Concern regarding reoccurring TIAs.  Dizziness as outlined.  Work up in the hospital included MRI and carotid ultrasound.  No acute abnormality found.  Taking daily aspirin.  No reoccurring symptoms.  Desires no further w/up, testing or evaluation.

## 2013-03-21 DIAGNOSIS — H01009 Unspecified blepharitis unspecified eye, unspecified eyelid: Secondary | ICD-10-CM | POA: Diagnosis not present

## 2013-03-21 DIAGNOSIS — H04129 Dry eye syndrome of unspecified lacrimal gland: Secondary | ICD-10-CM | POA: Diagnosis not present

## 2013-03-21 DIAGNOSIS — H402290 Chronic angle-closure glaucoma, unspecified eye, stage unspecified: Secondary | ICD-10-CM | POA: Diagnosis not present

## 2013-03-21 DIAGNOSIS — H47239 Glaucomatous optic atrophy, unspecified eye: Secondary | ICD-10-CM | POA: Diagnosis not present

## 2013-04-01 ENCOUNTER — Encounter: Payer: Self-pay | Admitting: Internal Medicine

## 2013-04-11 DIAGNOSIS — H47239 Glaucomatous optic atrophy, unspecified eye: Secondary | ICD-10-CM | POA: Diagnosis not present

## 2013-04-11 DIAGNOSIS — H4011X0 Primary open-angle glaucoma, stage unspecified: Secondary | ICD-10-CM | POA: Diagnosis not present

## 2013-04-11 DIAGNOSIS — H01009 Unspecified blepharitis unspecified eye, unspecified eyelid: Secondary | ICD-10-CM | POA: Diagnosis not present

## 2013-04-11 DIAGNOSIS — H04129 Dry eye syndrome of unspecified lacrimal gland: Secondary | ICD-10-CM | POA: Diagnosis not present

## 2013-04-25 DIAGNOSIS — Z23 Encounter for immunization: Secondary | ICD-10-CM | POA: Diagnosis not present

## 2013-04-26 DIAGNOSIS — H903 Sensorineural hearing loss, bilateral: Secondary | ICD-10-CM | POA: Diagnosis not present

## 2013-04-26 DIAGNOSIS — H6120 Impacted cerumen, unspecified ear: Secondary | ICD-10-CM | POA: Diagnosis not present

## 2013-04-29 DIAGNOSIS — H409 Unspecified glaucoma: Secondary | ICD-10-CM | POA: Diagnosis not present

## 2013-04-29 DIAGNOSIS — H02839 Dermatochalasis of unspecified eye, unspecified eyelid: Secondary | ICD-10-CM | POA: Diagnosis not present

## 2013-04-29 DIAGNOSIS — H4010X0 Unspecified open-angle glaucoma, stage unspecified: Secondary | ICD-10-CM | POA: Diagnosis not present

## 2013-04-29 DIAGNOSIS — H04129 Dry eye syndrome of unspecified lacrimal gland: Secondary | ICD-10-CM | POA: Diagnosis not present

## 2013-05-01 DIAGNOSIS — H02409 Unspecified ptosis of unspecified eyelid: Secondary | ICD-10-CM | POA: Diagnosis not present

## 2013-05-08 DIAGNOSIS — M79609 Pain in unspecified limb: Secondary | ICD-10-CM | POA: Diagnosis not present

## 2013-05-08 DIAGNOSIS — B351 Tinea unguium: Secondary | ICD-10-CM | POA: Diagnosis not present

## 2013-05-22 DIAGNOSIS — H903 Sensorineural hearing loss, bilateral: Secondary | ICD-10-CM | POA: Diagnosis not present

## 2013-05-29 DIAGNOSIS — L821 Other seborrheic keratosis: Secondary | ICD-10-CM | POA: Diagnosis not present

## 2013-05-29 DIAGNOSIS — L570 Actinic keratosis: Secondary | ICD-10-CM | POA: Diagnosis not present

## 2013-05-29 DIAGNOSIS — L819 Disorder of pigmentation, unspecified: Secondary | ICD-10-CM | POA: Diagnosis not present

## 2013-05-29 DIAGNOSIS — L820 Inflamed seborrheic keratosis: Secondary | ICD-10-CM | POA: Diagnosis not present

## 2013-05-30 DIAGNOSIS — H905 Unspecified sensorineural hearing loss: Secondary | ICD-10-CM | POA: Diagnosis not present

## 2013-06-06 ENCOUNTER — Telehealth: Payer: Self-pay | Admitting: Internal Medicine

## 2013-06-06 NOTE — Telephone Encounter (Signed)
The patient can't hardly walk having pain in her back . Wants to see Dr. Lorin Picket . I offered her an appointment with Raquel she does not want to see her .

## 2013-06-06 NOTE — Telephone Encounter (Signed)
I told pt I would see her tomorrow at 2:00.  Please put her on the schedule.  Thanks.

## 2013-06-06 NOTE — Telephone Encounter (Signed)
Please advise 

## 2013-06-07 ENCOUNTER — Ambulatory Visit (INDEPENDENT_AMBULATORY_CARE_PROVIDER_SITE_OTHER): Payer: Medicare Other | Admitting: Internal Medicine

## 2013-06-07 ENCOUNTER — Encounter: Payer: Self-pay | Admitting: Internal Medicine

## 2013-06-07 VITALS — BP 130/70 | HR 102 | Temp 98.3°F | Ht 60.5 in | Wt 101.8 lb

## 2013-06-07 DIAGNOSIS — K2270 Barrett's esophagus without dysplasia: Secondary | ICD-10-CM | POA: Diagnosis not present

## 2013-06-07 NOTE — Patient Instructions (Signed)
Take tylenol extra strength : 1-2 tablets 2x/day as needed

## 2013-06-07 NOTE — Progress Notes (Signed)
Pre-visit discussion using our clinic review tool. No additional management support is needed unless otherwise documented below in the visit note.  

## 2013-06-07 NOTE — Telephone Encounter (Signed)
Appointment made

## 2013-06-09 ENCOUNTER — Encounter: Payer: Self-pay | Admitting: Internal Medicine

## 2013-06-09 NOTE — Progress Notes (Signed)
Subjective:    Patient ID: Christy Cisneros, female    DOB: Mar 15, 1920, 79 y.o.   MRN: [id number]  Back Pain  79 year old female with past history of chronic bronchitis, GERD and osteoporosis who comes in today as a work in with concerns regarding low back pain.   States that five days ago, she was sitting (twisted sideways) in a chair - watching TV.  The next day noticed some increased low back pain.  No radiation down her leg.  No numbness or tingling.  No fall or injury.  Bending is worse.  Is better now.  Able to walk.  Some pain with walking.  No bowel or bladder change.     Past Medical History  Diagnosis Date  . Chronic bronchitis   . Hypercholesteremia   . GERD (gastroesophageal reflux disease)   . Barrett's esophagus with esophagitis   . Sleep apnea     has CPAP  . Glaucoma   . TIA (transient ischemic attack)   . Osteoporosis   . Osteoarthritis     Current Outpatient Prescriptions on File Prior to Visit  Medication Sig Dispense Refill  . aspirin 81 MG tablet Take 81 mg by mouth daily.      . Bilberry, Vaccinium myrtillus, (BILBERRY PO) Take 1 capsule by mouth daily.      . calcium carbonate (OS-CAL) 600 MG TABS Take 600 mg by mouth daily.      . Multiple Vitamins-Minerals (PRESERVISION AREDS 2 PO) Take by mouth 2 (two) times daily.      Marland Kitchen omeprazole (PRILOSEC) 20 MG capsule Take 20 mg by mouth daily.      . prednisoLONE acetate (PRED FORTE) 1 % ophthalmic suspension Place 1 drop into the left eye 2 (two) times daily.      . timolol (BETIMOL) 0.5 % ophthalmic solution Place 1 drop into the right eye daily.       No current facility-administered medications on file prior to visit.    Review of Systems  Musculoskeletal: Positive for back pain.  Patient denies any headache.  No dizziness.  No chest pain, tightness or palpitations. No increased shortness of breath, cough or congestion.  No nausea or vomiting.  Feels her reflux - doing well.  No abdominal pain or cramping.   No bowel change, such as diarrhea, constipation, BRBPR or melana.  Low back pain as outlined.  No radiation of pain down her legs.       Objective:   Physical Exam  Filed Vitals:   06/07/13 1346  BP: 130/70  Pulse: 102  Temp: 98.3 F (46.85 C)   78 year old female in no acute distress.   HEENT:  Nares- clear.  Oropharynx - without lesions. NECK:  Supple.  Nontender.  No audible bruit.  HEART:  Appears to be regular. LUNGS:  No crackles or wheezing audible.  Respirations even and unlabored.  RADIAL PULSE:  Equal bilaterally.   ABDOMEN:  Soft, nontender.  Bowel sounds present and normal.  No audible abdominal bruit.    EXTREMITIES:  No increased edema present.  DP pulses palpable and equal bilaterally.  No pain with straight leg raise.   BACK:  Increased pain with going from a seated to a standing position.  No significant pain with walking.            Assessment & Plan:  PULMONARY.  Breathing appears to be doing better.   1/14 f/u xray revealed no acute cardiopulmonary disease.   BACK  PAIN.  Pain as outlined.  Discussed further w/up.  She wants to hold on xray.  Unable to take antiinflammatories.  Tylenol as directed.  Call with update.  If persistent symptoms, will require further testing and evaluation.       HEALTH MAINTENANCE.  After review of outside records:  Mammogram 11/15/11 rec additional views. A follow up left breast mammo and ultrasound 11/17/11 - BI-RADS II.  She declines further GI evaluation.  Declines pelvic exam.  Declines mammogram.  Physical 12/10/12.

## 2013-06-09 NOTE — Assessment & Plan Note (Signed)
Doing better - symptoms.  Declines any further follow up.

## 2013-06-10 DIAGNOSIS — H04129 Dry eye syndrome of unspecified lacrimal gland: Secondary | ICD-10-CM | POA: Diagnosis not present

## 2013-06-10 DIAGNOSIS — H409 Unspecified glaucoma: Secondary | ICD-10-CM | POA: Diagnosis not present

## 2013-06-10 DIAGNOSIS — H4011X0 Primary open-angle glaucoma, stage unspecified: Secondary | ICD-10-CM | POA: Diagnosis not present

## 2013-06-10 DIAGNOSIS — H02839 Dermatochalasis of unspecified eye, unspecified eyelid: Secondary | ICD-10-CM | POA: Diagnosis not present

## 2013-06-18 DIAGNOSIS — H01009 Unspecified blepharitis unspecified eye, unspecified eyelid: Secondary | ICD-10-CM | POA: Diagnosis not present

## 2013-06-18 DIAGNOSIS — H02839 Dermatochalasis of unspecified eye, unspecified eyelid: Secondary | ICD-10-CM | POA: Diagnosis not present

## 2013-06-18 DIAGNOSIS — H1030 Unspecified acute conjunctivitis, unspecified eye: Secondary | ICD-10-CM | POA: Diagnosis not present

## 2013-06-18 DIAGNOSIS — H04129 Dry eye syndrome of unspecified lacrimal gland: Secondary | ICD-10-CM | POA: Diagnosis not present

## 2013-07-22 ENCOUNTER — Ambulatory Visit: Payer: Medicare Other | Admitting: Internal Medicine

## 2013-07-22 DIAGNOSIS — H6120 Impacted cerumen, unspecified ear: Secondary | ICD-10-CM | POA: Diagnosis not present

## 2013-07-22 DIAGNOSIS — H905 Unspecified sensorineural hearing loss: Secondary | ICD-10-CM | POA: Diagnosis not present

## 2013-07-22 DIAGNOSIS — H6980 Other specified disorders of Eustachian tube, unspecified ear: Secondary | ICD-10-CM | POA: Diagnosis not present

## 2013-08-06 DIAGNOSIS — H4011X0 Primary open-angle glaucoma, stage unspecified: Secondary | ICD-10-CM | POA: Diagnosis not present

## 2013-08-06 DIAGNOSIS — H04129 Dry eye syndrome of unspecified lacrimal gland: Secondary | ICD-10-CM | POA: Diagnosis not present

## 2013-08-06 DIAGNOSIS — H02839 Dermatochalasis of unspecified eye, unspecified eyelid: Secondary | ICD-10-CM | POA: Diagnosis not present

## 2013-08-06 DIAGNOSIS — H409 Unspecified glaucoma: Secondary | ICD-10-CM | POA: Diagnosis not present

## 2013-08-07 DIAGNOSIS — M79609 Pain in unspecified limb: Secondary | ICD-10-CM | POA: Diagnosis not present

## 2013-08-07 DIAGNOSIS — B351 Tinea unguium: Secondary | ICD-10-CM | POA: Diagnosis not present

## 2013-09-09 DIAGNOSIS — H905 Unspecified sensorineural hearing loss: Secondary | ICD-10-CM | POA: Diagnosis not present

## 2013-09-09 DIAGNOSIS — H6120 Impacted cerumen, unspecified ear: Secondary | ICD-10-CM | POA: Diagnosis not present

## 2013-09-18 DIAGNOSIS — H409 Unspecified glaucoma: Secondary | ICD-10-CM | POA: Diagnosis not present

## 2013-09-18 DIAGNOSIS — H35369 Drusen (degenerative) of macula, unspecified eye: Secondary | ICD-10-CM | POA: Diagnosis not present

## 2013-09-18 DIAGNOSIS — H4011X0 Primary open-angle glaucoma, stage unspecified: Secondary | ICD-10-CM | POA: Diagnosis not present

## 2013-09-18 DIAGNOSIS — H353190 Nonexudative age-related macular degeneration, unspecified eye, stage unspecified: Secondary | ICD-10-CM | POA: Diagnosis not present

## 2013-10-03 DIAGNOSIS — H532 Diplopia: Secondary | ICD-10-CM | POA: Diagnosis not present

## 2013-10-03 DIAGNOSIS — H0289 Other specified disorders of eyelid: Secondary | ICD-10-CM | POA: Diagnosis not present

## 2013-10-03 DIAGNOSIS — Z947 Corneal transplant status: Secondary | ICD-10-CM | POA: Diagnosis not present

## 2013-11-04 ENCOUNTER — Telehealth: Payer: Self-pay | Admitting: Internal Medicine

## 2013-11-04 DIAGNOSIS — M810 Age-related osteoporosis without current pathological fracture: Secondary | ICD-10-CM

## 2013-11-04 NOTE — Telephone Encounter (Signed)
Pt sent a letter requesting a bone density.  Order placed for bone density to be scheduled.  States needs bone density betweein 10/30/13 and 11/20/13.  Thanks.

## 2013-11-05 NOTE — Telephone Encounter (Signed)
Order printed. Will schedule.

## 2013-11-07 ENCOUNTER — Telehealth: Payer: Self-pay | Admitting: Internal Medicine

## 2013-11-07 NOTE — Telephone Encounter (Signed)
Pt left vm.  States she wrote Dr. Nicki Reaper a letter on 10/03/2013 regarding having a bone density test and has not heard back.  States she comes in for appt with Dr. Nicki Reaper in May and would like to have the bone density completed before the visit with Dr. Nicki Reaper.  States she has been on prevacid for many years and is concerned as she knows it can cause bone loss.  Pt asking for a call.

## 2013-11-07 NOTE — Telephone Encounter (Signed)
Please advise 

## 2013-11-07 NOTE — Telephone Encounter (Signed)
Has been ordered.  Let her know when scheduled.

## 2013-11-29 DIAGNOSIS — M810 Age-related osteoporosis without current pathological fracture: Secondary | ICD-10-CM | POA: Diagnosis not present

## 2013-12-09 DIAGNOSIS — H6120 Impacted cerumen, unspecified ear: Secondary | ICD-10-CM | POA: Diagnosis not present

## 2013-12-09 DIAGNOSIS — H905 Unspecified sensorineural hearing loss: Secondary | ICD-10-CM | POA: Diagnosis not present

## 2013-12-12 ENCOUNTER — Ambulatory Visit (INDEPENDENT_AMBULATORY_CARE_PROVIDER_SITE_OTHER): Payer: Medicare Other | Admitting: Internal Medicine

## 2013-12-12 ENCOUNTER — Encounter: Payer: Self-pay | Admitting: Internal Medicine

## 2013-12-12 VITALS — BP 130/80 | HR 87 | Temp 97.9°F | Ht 59.0 in | Wt 104.0 lb

## 2013-12-12 DIAGNOSIS — K2270 Barrett's esophagus without dysplasia: Secondary | ICD-10-CM

## 2013-12-12 DIAGNOSIS — M1990 Unspecified osteoarthritis, unspecified site: Secondary | ICD-10-CM

## 2013-12-12 DIAGNOSIS — R5381 Other malaise: Secondary | ICD-10-CM | POA: Diagnosis not present

## 2013-12-12 DIAGNOSIS — G459 Transient cerebral ischemic attack, unspecified: Secondary | ICD-10-CM

## 2013-12-12 DIAGNOSIS — R5383 Other fatigue: Secondary | ICD-10-CM

## 2013-12-12 DIAGNOSIS — M810 Age-related osteoporosis without current pathological fracture: Secondary | ICD-10-CM

## 2013-12-12 DIAGNOSIS — H409 Unspecified glaucoma: Secondary | ICD-10-CM

## 2013-12-12 DIAGNOSIS — Z23 Encounter for immunization: Secondary | ICD-10-CM

## 2013-12-12 DIAGNOSIS — G4733 Obstructive sleep apnea (adult) (pediatric): Secondary | ICD-10-CM

## 2013-12-12 DIAGNOSIS — E7800 Pure hypercholesterolemia, unspecified: Secondary | ICD-10-CM

## 2013-12-12 LAB — COMPREHENSIVE METABOLIC PANEL
ALBUMIN: 4.2 g/dL (ref 3.5–5.2)
ALT: 17 U/L (ref 0–35)
AST: 24 U/L (ref 0–37)
Alkaline Phosphatase: 63 U/L (ref 39–117)
BUN: 21 mg/dL (ref 6–23)
CALCIUM: 10.2 mg/dL (ref 8.4–10.5)
CHLORIDE: 104 meq/L (ref 96–112)
CO2: 30 meq/L (ref 19–32)
CREATININE: 0.8 mg/dL (ref 0.4–1.2)
GFR: 75.72 mL/min (ref >60.00)
Glucose, Bld: 93 mg/dL (ref 70–99)
POTASSIUM: 5.6 meq/L — AB (ref 3.5–5.1)
Sodium: 140 mEq/L (ref 135–145)
Total Bilirubin: 0.6 mg/dL (ref 0.2–1.2)
Total Protein: 6.2 g/dL (ref 6.0–8.3)

## 2013-12-12 LAB — CBC WITH DIFFERENTIAL/PLATELET
BASOS PCT: 0.9 % (ref 0.0–3.0)
Basophils Absolute: 0.1 10*3/uL (ref 0.0–0.1)
EOS ABS: 0.1 10*3/uL (ref 0.0–0.7)
EOS PCT: 1.6 % (ref 0.0–5.0)
HCT: 43.4 % (ref 36.0–46.0)
HEMOGLOBIN: 14.3 g/dL (ref 12.0–15.0)
LYMPHS PCT: 17.6 % (ref 12.0–46.0)
Lymphs Abs: 1.3 10*3/uL (ref 0.7–4.0)
MCHC: 32.9 g/dL (ref 30.0–36.0)
MCV: 93.1 fl (ref 78.0–100.0)
MONOS PCT: 7.5 % (ref 3.0–12.0)
Monocytes Absolute: 0.6 10*3/uL (ref 0.1–1.0)
NEUTROS ABS: 5.5 10*3/uL (ref 1.4–7.7)
NEUTROS PCT: 72.4 % (ref 43.0–77.0)
Platelets: 283 10*3/uL (ref 150.0–400.0)
RBC: 4.67 Mil/uL (ref 3.87–5.11)
RDW: 14.1 % (ref 11.5–15.5)
WBC: 7.6 10*3/uL (ref 4.0–10.5)

## 2013-12-12 LAB — URINALYSIS, ROUTINE W REFLEX MICROSCOPIC
BILIRUBIN URINE: NEGATIVE
HGB URINE DIPSTICK: NEGATIVE
KETONES UR: NEGATIVE
LEUKOCYTES UA: NEGATIVE
Nitrite: NEGATIVE
PH: 5.5 (ref 5.0–8.0)
Specific Gravity, Urine: 1.025 (ref 1.000–1.030)
Urine Glucose: NEGATIVE
Urobilinogen, UA: 0.2 (ref 0.0–1.0)

## 2013-12-12 LAB — TSH: TSH: 1.26 u[IU]/mL (ref 0.35–4.50)

## 2013-12-12 NOTE — Progress Notes (Signed)
Pre visit review using our clinic review tool, if applicable. No additional management support is needed unless otherwise documented below in the visit note. 

## 2013-12-13 ENCOUNTER — Other Ambulatory Visit (INDEPENDENT_AMBULATORY_CARE_PROVIDER_SITE_OTHER): Payer: Medicare Other

## 2013-12-13 ENCOUNTER — Telehealth: Payer: Self-pay | Admitting: Internal Medicine

## 2013-12-13 ENCOUNTER — Other Ambulatory Visit: Payer: Self-pay | Admitting: Internal Medicine

## 2013-12-13 ENCOUNTER — Other Ambulatory Visit: Payer: Medicare Other

## 2013-12-13 DIAGNOSIS — E875 Hyperkalemia: Secondary | ICD-10-CM | POA: Diagnosis not present

## 2013-12-13 LAB — POTASSIUM: POTASSIUM: 3.9 meq/L (ref 3.5–5.1)

## 2013-12-13 LAB — VITAMIN D 25 HYDROXY (VIT D DEFICIENCY, FRACTURES): VIT D 25 HYDROXY: 39 ng/mL (ref 30–89)

## 2013-12-13 NOTE — Telephone Encounter (Signed)
Pt  Called to let you know she is also talk Organic ground flax seed Aloe vera  Liquid

## 2013-12-13 NOTE — Telephone Encounter (Signed)
Added to medication list

## 2013-12-13 NOTE — Progress Notes (Signed)
Order placed for f/u potassium.  

## 2013-12-16 ENCOUNTER — Other Ambulatory Visit: Payer: Medicare Other

## 2013-12-16 ENCOUNTER — Encounter: Payer: Self-pay | Admitting: *Deleted

## 2013-12-22 ENCOUNTER — Encounter: Payer: Self-pay | Admitting: Internal Medicine

## 2013-12-22 NOTE — Assessment & Plan Note (Signed)
Continue calcium and vitamin D.  Discussed further treatment options.  Discussed reclast.  She agrees today.  Will arrange.

## 2013-12-22 NOTE — Assessment & Plan Note (Signed)
Followed by opthalmology.       

## 2013-12-22 NOTE — Assessment & Plan Note (Signed)
Low cholesterol diet.  Will follow.  She declines cholesterol medication.

## 2013-12-22 NOTE — Assessment & Plan Note (Signed)
Appears to be doing better.  Continue exercise.

## 2013-12-22 NOTE — Assessment & Plan Note (Signed)
Symptoms stable.   Declines any further follow up.

## 2013-12-22 NOTE — Progress Notes (Signed)
Subjective:    Patient ID: Christy Cisneros, female    DOB: Jul 01, 1920, 80 y.o.   MRN: [id number]  HPI 80 year old female with past history of chronic bronchitis, GERD and osteoporosis who comes in today to follow up on these issues as well as for a complete physical exam.   States she is doing better.  Energy is better.  Stays active.  Discussed CPAP and the need to use CPAP regularly.  Here to discuss her bone density.  We discussed results and treatment options.  No nausea or vomiting.  No bowel change.  Taking omeprazole.  This is controlling her acid.       Past Medical History  Diagnosis Date  . Chronic bronchitis   . Hypercholesteremia   . GERD (gastroesophageal reflux disease)   . Barrett's esophagus with esophagitis   . Sleep apnea     has CPAP  . Glaucoma   . TIA (transient ischemic attack)   . Osteoporosis   . Osteoarthritis     Current Outpatient Prescriptions on File Prior to Visit  Medication Sig Dispense Refill  . aspirin 81 MG tablet Take 81 mg by mouth daily.      . Bilberry, Vaccinium myrtillus, (BILBERRY PO) Take 1 capsule by mouth daily.      . calcium carbonate (OS-CAL) 600 MG TABS Take 600 mg by mouth daily.      . Multiple Vitamins-Minerals (PRESERVISION AREDS 2 PO) Take by mouth 2 (two) times daily.      Marland Kitchen omeprazole (PRILOSEC) 20 MG capsule Take 20 mg by mouth daily.      . prednisoLONE acetate (PRED FORTE) 1 % ophthalmic suspension Place 1 drop into the left eye 2 (two) times daily.      . timolol (BETIMOL) 0.5 % ophthalmic solution Place 1 drop into the right eye daily.       No current facility-administered medications on file prior to visit.    Review of Systems Patient denies any headache.  No dizziness.  No slurring of speech or other neurological symptoms.   No chest pain, tightness or palpitations. No increased shortness of breath, cough or congestion.  No nausea or vomiting.  Feels her reflux - doing well.  No abdominal pain or cramping.  No  bowel change, such as diarrhea, constipation, BRBPR or melana.  Overall doing better.      Objective:   Physical Exam  Filed Vitals:   12/12/13 1320  BP: 130/80  Pulse: 87  Temp: 97.9 F (30.79 C)   80 year old female in no acute distress.   HEENT:  Nares- clear.  Oropharynx - without lesions. NECK:  Supple.  Nontender.  No audible bruit.  HEART:  Appears to be regular. LUNGS:  No crackles or wheezing audible.  Respirations even and unlabored.  RADIAL PULSE:  Equal bilaterally.    BREASTS:  No nipple discharge or nipple retraction present.  Could not appreciate any distinct nodules or axillary adenopathy.  ABDOMEN:  Soft, nontender.  Bowel sounds present and normal.  No audible abdominal bruit.  GU:  Not performed.     EXTREMITIES:  No increased edema present.  DP pulses palpable and equal bilaterally.           Assessment & Plan:  PULMONARY.  Breathing appears to be doing better.   1/14 f/u xray revealed no acute cardiopulmonary disease.   HEALTH MAINTENANCE.  After review of outside records:  Mammogram 11/15/11 rec additional views. A follow up left  breast mammo and ultrasound 11/17/11 - BI-RADS II.  She declines further GI evaluation.  Declines pelvic exam.  Declines mammogram.  Physical today.    I spent 25 minutes with the patient and more than 50% of the time was spent in consultation regarding the above.

## 2013-12-22 NOTE — Assessment & Plan Note (Signed)
Have encouraged her to use CPAP regularly.  Follow.

## 2013-12-22 NOTE — Assessment & Plan Note (Signed)
Concern regarding reoccurring TIAs.  Previous work up in the hospital included MRI and carotid ultrasound.  No acute abnormality found.  Taking daily aspirin.  No reoccurring symptoms.

## 2013-12-24 ENCOUNTER — Telehealth: Payer: Self-pay | Admitting: Internal Medicine

## 2013-12-24 DIAGNOSIS — H35369 Drusen (degenerative) of macula, unspecified eye: Secondary | ICD-10-CM | POA: Diagnosis not present

## 2013-12-24 DIAGNOSIS — H409 Unspecified glaucoma: Secondary | ICD-10-CM | POA: Diagnosis not present

## 2013-12-24 DIAGNOSIS — H353190 Nonexudative age-related macular degeneration, unspecified eye, stage unspecified: Secondary | ICD-10-CM | POA: Diagnosis not present

## 2013-12-24 DIAGNOSIS — H4011X0 Primary open-angle glaucoma, stage unspecified: Secondary | ICD-10-CM | POA: Diagnosis not present

## 2013-12-24 NOTE — Telephone Encounter (Signed)
Pt left vm.  States she had bone density testing and was told she would receive a call from [facility] regarding being seen for treatment.  Pt states she has not heard anything from [facility] or any other office regarding her bone density or treatment.

## 2013-12-25 NOTE — Telephone Encounter (Signed)
Pt notified and verbalized understanding.

## 2013-12-25 NOTE — Telephone Encounter (Signed)
She was informed of her bone density results at her appt last week.  I have completed the form for Reclast.  This has to be sent to [facility] and they have to get prior authorization.  They will then call her with an appt to have the reclast.  May take a couple of weeks.

## 2014-01-15 NOTE — Telephone Encounter (Signed)
Pt called again today to report that she still hasn't heard anything form [facility]. I called today to check on the status & was told that they do not have a Reclast form for her.

## 2014-01-16 NOTE — Telephone Encounter (Signed)
Please re fax form (if we have it).  If not, I will need to complete another form.  Thanks.

## 2014-01-16 NOTE — Telephone Encounter (Signed)
I could not find previous fax. Restarted a new form, Dr. Nicki Reaper to complete when she returns.

## 2014-01-21 NOTE — Telephone Encounter (Signed)
Reclast from & demographics faxed to [facility]

## 2014-01-27 DIAGNOSIS — M79609 Pain in unspecified limb: Secondary | ICD-10-CM | POA: Diagnosis not present

## 2014-01-27 DIAGNOSIS — B351 Tinea unguium: Secondary | ICD-10-CM | POA: Diagnosis not present

## 2014-02-06 DIAGNOSIS — H6120 Impacted cerumen, unspecified ear: Secondary | ICD-10-CM | POA: Diagnosis not present

## 2014-02-06 DIAGNOSIS — H903 Sensorineural hearing loss, bilateral: Secondary | ICD-10-CM | POA: Diagnosis not present

## 2014-02-06 DIAGNOSIS — H905 Unspecified sensorineural hearing loss: Secondary | ICD-10-CM | POA: Diagnosis not present

## 2014-02-20 DIAGNOSIS — L570 Actinic keratosis: Secondary | ICD-10-CM | POA: Diagnosis not present

## 2014-02-20 DIAGNOSIS — L820 Inflamed seborrheic keratosis: Secondary | ICD-10-CM | POA: Diagnosis not present

## 2014-02-20 DIAGNOSIS — L819 Disorder of pigmentation, unspecified: Secondary | ICD-10-CM | POA: Diagnosis not present

## 2014-02-20 DIAGNOSIS — L578 Other skin changes due to chronic exposure to nonionizing radiation: Secondary | ICD-10-CM | POA: Diagnosis not present

## 2014-02-20 DIAGNOSIS — D692 Other nonthrombocytopenic purpura: Secondary | ICD-10-CM | POA: Diagnosis not present

## 2014-02-25 DIAGNOSIS — M810 Age-related osteoporosis without current pathological fracture: Secondary | ICD-10-CM | POA: Diagnosis not present

## 2014-03-05 DIAGNOSIS — Z947 Corneal transplant status: Secondary | ICD-10-CM | POA: Diagnosis not present

## 2014-03-11 DIAGNOSIS — H04129 Dry eye syndrome of unspecified lacrimal gland: Secondary | ICD-10-CM | POA: Diagnosis not present

## 2014-03-11 DIAGNOSIS — H353190 Nonexudative age-related macular degeneration, unspecified eye, stage unspecified: Secondary | ICD-10-CM | POA: Diagnosis not present

## 2014-03-11 DIAGNOSIS — H47239 Glaucomatous optic atrophy, unspecified eye: Secondary | ICD-10-CM | POA: Diagnosis not present

## 2014-03-11 DIAGNOSIS — H409 Unspecified glaucoma: Secondary | ICD-10-CM | POA: Diagnosis not present

## 2014-03-11 DIAGNOSIS — H4011X0 Primary open-angle glaucoma, stage unspecified: Secondary | ICD-10-CM | POA: Diagnosis not present

## 2014-04-22 DIAGNOSIS — Z23 Encounter for immunization: Secondary | ICD-10-CM | POA: Diagnosis not present

## 2014-04-28 ENCOUNTER — Emergency Department: Payer: Self-pay | Admitting: Emergency Medicine

## 2014-04-28 DIAGNOSIS — M109 Gout, unspecified: Secondary | ICD-10-CM | POA: Diagnosis not present

## 2014-04-28 DIAGNOSIS — M7989 Other specified soft tissue disorders: Secondary | ICD-10-CM | POA: Diagnosis not present

## 2014-04-28 DIAGNOSIS — M25579 Pain in unspecified ankle and joints of unspecified foot: Secondary | ICD-10-CM | POA: Diagnosis not present

## 2014-04-28 LAB — CBC
HCT: 41.2 % (ref 35.0–47.0)
HGB: 13.6 g/dL (ref 12.0–16.0)
MCH: 30.6 pg (ref 26.0–34.0)
MCHC: 32.9 g/dL (ref 32.0–36.0)
MCV: 93 fL (ref 80–100)
PLATELETS: 235 10*3/uL (ref 150–440)
RBC: 4.43 10*6/uL (ref 3.80–5.20)
RDW: 13.6 % (ref 11.5–14.5)
WBC: 9.5 10*3/uL (ref 3.6–11.0)

## 2014-04-28 LAB — URIC ACID: URIC ACID: 3.5 mg/dL (ref 2.6–6.0)

## 2014-05-05 ENCOUNTER — Telehealth: Payer: Self-pay | Admitting: Internal Medicine

## 2014-05-05 NOTE — Telephone Encounter (Signed)
Reviewed her notes.  If her foot is better, I would decrease or stop the naproxen - given her history with her stomach and acid reflux.  Let us know if she needs anything.

## 2014-05-05 NOTE — Telephone Encounter (Signed)
Left detailed voicemail with information

## 2014-05-05 NOTE — Telephone Encounter (Signed)
Pt dropped off paper work that she receive from the ER doctor. She wanted to know is she should stop taking her Naproxen. Please advise pt. Paper work in Dr. Bary Leriche box.msn

## 2014-05-05 NOTE — Telephone Encounter (Signed)
Please advise, records in green folder

## 2014-05-07 ENCOUNTER — Encounter: Payer: Self-pay | Admitting: *Deleted

## 2014-05-08 DIAGNOSIS — H6120 Impacted cerumen, unspecified ear: Secondary | ICD-10-CM | POA: Diagnosis not present

## 2014-05-08 DIAGNOSIS — H905 Unspecified sensorineural hearing loss: Secondary | ICD-10-CM | POA: Diagnosis not present

## 2014-05-27 DIAGNOSIS — M79674 Pain in right toe(s): Secondary | ICD-10-CM | POA: Diagnosis not present

## 2014-05-27 DIAGNOSIS — B351 Tinea unguium: Secondary | ICD-10-CM | POA: Diagnosis not present

## 2014-05-27 DIAGNOSIS — M79675 Pain in left toe(s): Secondary | ICD-10-CM | POA: Diagnosis not present

## 2014-06-05 ENCOUNTER — Ambulatory Visit: Payer: Medicare Other | Admitting: Internal Medicine

## 2014-06-06 ENCOUNTER — Encounter: Payer: Self-pay | Admitting: Internal Medicine

## 2014-06-06 ENCOUNTER — Ambulatory Visit (INDEPENDENT_AMBULATORY_CARE_PROVIDER_SITE_OTHER): Payer: Medicare Other | Admitting: Internal Medicine

## 2014-06-06 VITALS — BP 102/60 | HR 95 | Temp 97.6°F | Ht 59.0 in | Wt 102.2 lb

## 2014-06-06 DIAGNOSIS — K2270 Barrett's esophagus without dysplasia: Secondary | ICD-10-CM | POA: Diagnosis not present

## 2014-06-06 DIAGNOSIS — E7800 Pure hypercholesterolemia, unspecified: Secondary | ICD-10-CM

## 2014-06-06 DIAGNOSIS — G459 Transient cerebral ischemic attack, unspecified: Secondary | ICD-10-CM | POA: Diagnosis not present

## 2014-06-06 DIAGNOSIS — M810 Age-related osteoporosis without current pathological fracture: Secondary | ICD-10-CM | POA: Diagnosis not present

## 2014-06-06 DIAGNOSIS — G4733 Obstructive sleep apnea (adult) (pediatric): Secondary | ICD-10-CM | POA: Diagnosis not present

## 2014-06-06 NOTE — Progress Notes (Signed)
Subjective:    Patient ID: Christy Cisneros, female    DOB: Dec 28, 1919, 80 y.o.   MRN: [id number]  HPI 80 year old female with past history of chronic bronchitis, GERD and osteoporosis who comes in today for a scheduled follow up..   States she is doing better.  Energy is better.  Walking daily.  Stays active.  No nausea or vomiting.  No bowel change.  Taking omeprazole and aloe.  This is controlling her acid.   Previously had gout.  Resolved.  Received reclast.      Past Medical History  Diagnosis Date  . Chronic bronchitis   . Hypercholesteremia   . GERD (gastroesophageal reflux disease)   . Barrett's esophagus with esophagitis   . Sleep apnea     has CPAP  . Glaucoma   . TIA (transient ischemic attack)   . Osteoporosis   . Osteoarthritis     Current Outpatient Prescriptions on File Prior to Visit  Medication Sig Dispense Refill  . Aloe LIQD Take by mouth daily.     Marland Kitchen aspirin 81 MG tablet Take 81 mg by mouth daily.    . Bilberry, Vaccinium myrtillus, (BILBERRY PO) Take 1 capsule by mouth daily.    . calcium carbonate (OS-CAL) 600 MG TABS Take 600 mg by mouth daily.    . Flaxseed, Linseed, (GROUND FLAX SEEDS PO) Take by mouth daily.     . Multiple Vitamins-Minerals (PRESERVISION AREDS 2 PO) Take by mouth 2 (two) times daily.    Marland Kitchen omeprazole (PRILOSEC) 20 MG capsule Take 20 mg by mouth daily.    . prednisoLONE acetate (PRED FORTE) 1 % ophthalmic suspension Place 1 drop into the left eye 2 (two) times daily.    . timolol (BETIMOL) 0.5 % ophthalmic solution Place 1 drop into the right eye daily.     No current facility-administered medications on file prior to visit.    Review of Systems Patient denies any headache.  No dizziness.   No chest pain, tightness or palpitations. No increased shortness of breath, cough or congestion.  No nausea or vomiting.  Feels her reflux - doing well.  No abdominal pain or cramping.  No bowel change, such as diarrhea, constipation, BRBPR or  melana.  Overall doing better.      Objective:   Physical Exam  Filed Vitals:   06/06/14 0818  BP: 102/60  Pulse: 95  Temp: 97.6 F (36.4 C)   Blood pressure recheck:  124/78, pulse 68  80 year old female in no acute distress.   HEENT:  Nares- clear.  Oropharynx - without lesions. NECK:  Supple.  Nontender.  No audible bruit.  HEART:  Appears to be regular. LUNGS:  No crackles or wheezing audible.  Respirations even and unlabored.  RADIAL PULSE:  Equal bilaterally.  ABDOMEN:  Soft, nontender.  Bowel sounds present and normal.  No audible abdominal bruit.    EXTREMITIES:  No increased edema present.  DP pulses palpable and equal bilaterally.           Assessment & Plan:  PULMONARY.  Breathing appears to be doing better.   1/14 f/u xray revealed no acute cardiopulmonary disease.   Transient cerebral ischemia, unspecified transient cerebral ischemia type No reoccurring problems.  Continue daily aspirin.   Obstructive sleep apnea Continue CPAP.   BARRETTS ESOPHAGUS Symptoms controlled on omeprazole.  Avoid antiinflammatories.   Osteoporosis Received reclast.  Continues to f/u with Dr Jefm Bryant.    HYPERCHOLESTEROLEMIA Low cholesterol diet.  Will follow.   HEALTH MAINTENANCE.  After review of outside records:  Mammogram 11/15/11 rec additional views. A follow up left breast mammo and ultrasound 11/17/11 - BI-RADS II.  She declines further GI evaluation.  Declines pelvic exam.  Declines mammogram.  Schedule physical next visit.

## 2014-06-10 DIAGNOSIS — H04123 Dry eye syndrome of bilateral lacrimal glands: Secondary | ICD-10-CM | POA: Diagnosis not present

## 2014-06-10 DIAGNOSIS — H35363 Drusen (degenerative) of macula, bilateral: Secondary | ICD-10-CM | POA: Diagnosis not present

## 2014-06-10 DIAGNOSIS — H3531 Nonexudative age-related macular degeneration: Secondary | ICD-10-CM | POA: Diagnosis not present

## 2014-06-10 DIAGNOSIS — H5202 Hypermetropia, left eye: Secondary | ICD-10-CM | POA: Diagnosis not present

## 2014-06-10 DIAGNOSIS — H5211 Myopia, right eye: Secondary | ICD-10-CM | POA: Diagnosis not present

## 2014-06-10 DIAGNOSIS — H4011X3 Primary open-angle glaucoma, severe stage: Secondary | ICD-10-CM | POA: Diagnosis not present

## 2014-06-10 DIAGNOSIS — H47233 Glaucomatous optic atrophy, bilateral: Secondary | ICD-10-CM | POA: Diagnosis not present

## 2014-06-10 DIAGNOSIS — H52223 Regular astigmatism, bilateral: Secondary | ICD-10-CM | POA: Diagnosis not present

## 2014-06-24 DIAGNOSIS — L578 Other skin changes due to chronic exposure to nonionizing radiation: Secondary | ICD-10-CM | POA: Diagnosis not present

## 2014-06-24 DIAGNOSIS — L820 Inflamed seborrheic keratosis: Secondary | ICD-10-CM | POA: Diagnosis not present

## 2014-06-24 DIAGNOSIS — L821 Other seborrheic keratosis: Secondary | ICD-10-CM | POA: Diagnosis not present

## 2014-06-24 DIAGNOSIS — L853 Xerosis cutis: Secondary | ICD-10-CM | POA: Diagnosis not present

## 2014-06-24 DIAGNOSIS — L570 Actinic keratosis: Secondary | ICD-10-CM | POA: Diagnosis not present

## 2014-06-24 DIAGNOSIS — Z1283 Encounter for screening for malignant neoplasm of skin: Secondary | ICD-10-CM | POA: Diagnosis not present

## 2014-07-07 ENCOUNTER — Telehealth: Payer: Self-pay | Admitting: *Deleted

## 2014-07-07 NOTE — Telephone Encounter (Signed)
Pt came into the clinic states she has a wound on her right leg which is red and painful which appears to be infected.  Pt is requesting a referral to the wound clinic as soon as possible.  Please advise

## 2014-07-07 NOTE — Telephone Encounter (Signed)
Spoke with pt advised of Dr Lars Mage message.  She verbalized understanding and says she will go to [place]

## 2014-07-07 NOTE — Telephone Encounter (Signed)
Attempted to call pt, however no answer and no VM.  Will try again

## 2014-07-07 NOTE — Telephone Encounter (Signed)
I do not mind referring her to the wound clinic, but if red,etc- it sounds like she needs to be seen.  If acute and infected and swelling - evaluation today (acute care or [facility]).

## 2014-07-08 DIAGNOSIS — S81811A Laceration without foreign body, right lower leg, initial encounter: Secondary | ICD-10-CM | POA: Diagnosis not present

## 2014-07-18 ENCOUNTER — Encounter: Payer: Self-pay | Admitting: Surgery

## 2014-07-18 DIAGNOSIS — L97212 Non-pressure chronic ulcer of right calf with fat layer exposed: Secondary | ICD-10-CM | POA: Diagnosis not present

## 2014-07-27 DIAGNOSIS — J069 Acute upper respiratory infection, unspecified: Secondary | ICD-10-CM | POA: Diagnosis not present

## 2014-07-30 DIAGNOSIS — L97212 Non-pressure chronic ulcer of right calf with fat layer exposed: Secondary | ICD-10-CM | POA: Diagnosis not present

## 2014-07-31 DIAGNOSIS — A498 Other bacterial infections of unspecified site: Secondary | ICD-10-CM | POA: Diagnosis not present

## 2014-07-31 DIAGNOSIS — N390 Urinary tract infection, site not specified: Secondary | ICD-10-CM | POA: Diagnosis not present

## 2014-08-01 DIAGNOSIS — L97212 Non-pressure chronic ulcer of right calf with fat layer exposed: Secondary | ICD-10-CM | POA: Diagnosis not present

## 2014-08-04 ENCOUNTER — Encounter: Payer: Self-pay | Admitting: Internal Medicine

## 2014-08-04 ENCOUNTER — Ambulatory Visit (INDEPENDENT_AMBULATORY_CARE_PROVIDER_SITE_OTHER): Payer: Medicare Other | Admitting: Internal Medicine

## 2014-08-04 VITALS — BP 120/60 | HR 87 | Ht 59.0 in | Wt 101.0 lb

## 2014-08-04 DIAGNOSIS — Z139 Encounter for screening, unspecified: Secondary | ICD-10-CM | POA: Diagnosis not present

## 2014-08-04 DIAGNOSIS — N390 Urinary tract infection, site not specified: Secondary | ICD-10-CM

## 2014-08-04 DIAGNOSIS — R319 Hematuria, unspecified: Secondary | ICD-10-CM

## 2014-08-04 DIAGNOSIS — N3000 Acute cystitis without hematuria: Secondary | ICD-10-CM

## 2014-08-04 LAB — POCT URINALYSIS DIPSTICK
Bilirubin, UA: NEGATIVE
Glucose, UA: NEGATIVE
Nitrite, UA: NEGATIVE
PH UA: 5.5
PROTEIN UA: 30
SPEC GRAV UA: 1.015
Urobilinogen, UA: 0.2

## 2014-08-04 MED ORDER — AMOXICILLIN 500 MG PO CAPS: 500.0000 mg | ORAL_CAPSULE | Freq: Two times a day (BID) | ORAL | Status: DC

## 2014-08-04 NOTE — Patient Instructions (Signed)
Take the antibiotic twice a day.

## 2014-08-04 NOTE — Progress Notes (Signed)
Pre visit review using our clinic review tool, if applicable. No additional management support is needed unless otherwise documented below in the visit note. 

## 2014-08-06 ENCOUNTER — Telehealth: Payer: Self-pay

## 2014-08-06 NOTE — Telephone Encounter (Signed)
The patient called hoping to get an rx for Cipro for her bladder infection.  She states the other medication "is not working"

## 2014-08-06 NOTE — Telephone Encounter (Signed)
Her urine culture sensitivities are not back yet.  The reason she was placed on the abx she is taking is because of her allergies.  She has had problems with a medicine in the cipro family.  Has she take cipro previously and tolerated?  Before changing would like to see sensitivities.  If persistent problems, may need to be reevaluated.

## 2014-08-06 NOTE — Telephone Encounter (Signed)
Pt notified and  verbalized understanding. She has not taken Cipro before, a family member told her about Cipro. Advised pt not to take it as she may have an allergy to it,  verbalized understanding. Advised we would call once C&S results.

## 2014-08-07 ENCOUNTER — Encounter: Payer: Self-pay | Admitting: Internal Medicine

## 2014-08-07 ENCOUNTER — Other Ambulatory Visit: Payer: Self-pay | Admitting: *Deleted

## 2014-08-07 DIAGNOSIS — N390 Urinary tract infection, site not specified: Secondary | ICD-10-CM | POA: Insufficient documentation

## 2014-08-07 LAB — URINE CULTURE: Colony Count: >=100000

## 2014-08-07 MED ORDER — NITROFURANTOIN MONOHYD MACRO 100 MG PO CAPS: 100.0000 mg | ORAL_CAPSULE | Freq: Two times a day (BID) | ORAL | Status: DC

## 2014-08-07 NOTE — Progress Notes (Signed)
   Subjective:    Patient ID: ILO BEAMON, female    DOB: May 04, 1920, 81 y.o.   MRN: [id number]  HPI 81 year old female with past history of chronic bronchitis, GERD and osteoporosis who comes in today as a work in with concerns regarding persistent problems with UTI.  States symptoms started 07/30/14.  Dysuria.  Increased frequency.  Was seen at [facility].  Started on bactrim.  States did not tolerate.  Reports persistent urinary symptoms, so she stopped the medication.  Wants a different abx.  Reports has been drinking an increased amount of water.  No abdominal pain now.  No nausea or vomiting.  No diarrhea.     Past Medical History  Diagnosis Date  . Chronic bronchitis   . Hypercholesteremia   . GERD (gastroesophageal reflux disease)   . Barrett's esophagus with esophagitis   . Sleep apnea     has CPAP  . Glaucoma   . TIA (transient ischemic attack)   . Osteoporosis   . Osteoarthritis     Current Outpatient Prescriptions on File Prior to Visit  Medication Sig Dispense Refill  . Aloe LIQD Take by mouth daily.     Marland Kitchen aspirin 81 MG tablet Take 81 mg by mouth daily.    . Bilberry, Vaccinium myrtillus, (BILBERRY PO) Take 1 capsule by mouth daily.    . calcium carbonate (OS-CAL) 600 MG TABS Take 600 mg by mouth daily.    . Flaxseed, Linseed, (GROUND FLAX SEEDS PO) Take by mouth daily.     . Multiple Vitamins-Minerals (PRESERVISION AREDS 2 PO) Take by mouth 2 (two) times daily.    Marland Kitchen omeprazole (PRILOSEC) 20 MG capsule Take 20 mg by mouth daily.    . prednisoLONE acetate (PRED FORTE) 1 % ophthalmic suspension Place 1 drop into the left eye 2 (two) times daily.    . timolol (BETIMOL) 0.5 % ophthalmic solution Place 1 drop into the right eye daily.     No current facility-administered medications on file prior to visit.    Review of Systems Patient denies any fever.  No nausea or vomiting.  No abdominal pain or cramping now.  Previous abdominal discomfort.   No bowel change, such  as diarrhea.  Reports no hematuria.  Some dysuria.  Is better.  Still some persistent symptoms.          Objective:   Physical Exam Filed Vitals:   08/04/14 1210  BP: 120/60  Pulse: 41   81 year old female in no acute distress.  NECK:  Supple.  Nontender.  No audible bruit.  HEART:  Appears to be regular. LUNGS:  No crackles or wheezing audible.  Respirations even and unlabored.  ABDOMEN:  Soft, nontender.  Bowel sounds present and normal.  No audible abdominal bruit.        Assessment & Plan:  Acute cystitis without hematuria Symptoms and exam as outlined.  Was four doses of bactrim.  Symptoms are better.  Urine dip obtained today.  Start amoxicillin as directed.  Stay hydrated.  Follow.

## 2014-08-08 ENCOUNTER — Telehealth: Payer: Self-pay | Admitting: Internal Medicine

## 2014-08-08 ENCOUNTER — Encounter: Payer: Self-pay | Admitting: Internal Medicine

## 2014-08-08 ENCOUNTER — Ambulatory Visit (INDEPENDENT_AMBULATORY_CARE_PROVIDER_SITE_OTHER): Payer: Medicare Other | Admitting: Internal Medicine

## 2014-08-08 VITALS — BP 130/70 | HR 87 | Temp 97.4°F | Ht 59.0 in | Wt 101.0 lb

## 2014-08-08 DIAGNOSIS — N3000 Acute cystitis without hematuria: Secondary | ICD-10-CM

## 2014-08-08 DIAGNOSIS — K2100 Gastro-esophageal reflux disease with esophagitis, without bleeding: Secondary | ICD-10-CM

## 2014-08-08 DIAGNOSIS — K2270 Barrett's esophagus without dysplasia: Secondary | ICD-10-CM

## 2014-08-08 MED ORDER — FOSFOMYCIN TROMETHAMINE 3 G PO PACK: 3.0000 g | PACK | Freq: Once | ORAL | Status: DC

## 2014-08-08 MED ORDER — OMEPRAZOLE 20 MG PO CPDR: 20.0000 mg | DELAYED_RELEASE_CAPSULE | Freq: Two times a day (BID) | ORAL | Status: DC

## 2014-08-08 NOTE — Patient Instructions (Signed)
Take prilosec 20mg  twice a day.  (take one 30 minutes before breakfast and one 30 minutes before supper).

## 2014-08-08 NOTE — Telephone Encounter (Signed)
Will need to be seen

## 2014-08-08 NOTE — Progress Notes (Signed)
Pre visit review using our clinic review tool, if applicable. No additional management support is needed unless otherwise documented below in the visit note. 

## 2014-08-08 NOTE — Telephone Encounter (Signed)
Pt has been added to this afternoon's schedule

## 2014-08-08 NOTE — Telephone Encounter (Signed)
Pt stopped by to notify Dr. Nicki Reaper that she can not take nitrofurantoin monoh 100 mg. rx is causing bad relux to a point that she can not function. Please advise pt.msn

## 2014-08-11 ENCOUNTER — Telehealth: Payer: Self-pay | Admitting: Internal Medicine

## 2014-08-11 ENCOUNTER — Encounter: Payer: Self-pay | Admitting: Internal Medicine

## 2014-08-11 DIAGNOSIS — K219 Gastro-esophageal reflux disease without esophagitis: Secondary | ICD-10-CM | POA: Insufficient documentation

## 2014-08-11 NOTE — Progress Notes (Signed)
Subjective:    Patient ID: Christy Cisneros, female    DOB: [birth year], 81 y.o.   MRN: [id number]  HPI 81 year old female with past history of chronic bronchitis, GERD and osteoporosis who comes in today as a work in with concerns regarding increased acid reflux.  She felt related to her current abx. Was being treated for UTI.  Initially placed on bactrim.  Did not feel this worked.  I then placed her on amoxicillin.  She felt this did not help.  Changed to macrobid.  Today walks in with concerns that the macrobid is contributing to her worsening reflux.  No abdominal pain.  No nausea or vomiting.  No diarrhea.   On prilosec.  Has been eating an increased amount of [place] food.     Past Medical History  Diagnosis Date  . Chronic bronchitis   . Hypercholesteremia   . GERD (gastroesophageal reflux disease)   . Barrett's esophagus with esophagitis   . Sleep apnea     has CPAP  . Glaucoma   . TIA (transient ischemic attack)   . Osteoporosis   . Osteoarthritis     Current Outpatient Prescriptions on File Prior to Visit  Medication Sig Dispense Refill  . Aloe LIQD Take by mouth daily.     Marland Kitchen amoxicillin (AMOXIL) 500 MG capsule Take 1 capsule (500 mg total) by mouth 2 (two) times daily. 10 capsule 0  . aspirin 81 MG tablet Take 81 mg by mouth daily.    . Bilberry, Vaccinium myrtillus, (BILBERRY PO) Take 1 capsule by mouth daily.    . calcium carbonate (OS-CAL) 600 MG TABS Take 600 mg by mouth daily.    . Flaxseed, Linseed, (GROUND FLAX SEEDS PO) Take by mouth daily.     . Multiple Vitamins-Minerals (PRESERVISION AREDS 2 PO) Take by mouth 2 (two) times daily.    . prednisoLONE acetate (PRED FORTE) 1 % ophthalmic suspension Place 1 drop into the left eye 2 (two) times daily.    . timolol (BETIMOL) 0.5 % ophthalmic solution Place 1 drop into the right eye daily.     No current facility-administered medications on file prior to visit.    Review of Systems Patient denies any fever.  No  nausea or vomiting.  No abdominal pain or cramping.  Previous abdominal discomfort.   None now.  No bowel change, such as diarrhea.  Reports no hematuria.  No urinary symptoms now.  Increased acid reflux.  Off abx now.  No dysphagia.            Objective:   Physical Exam  Filed Vitals:   08/08/14 1526  BP: 130/70  Pulse: 87  Temp: 97.4 F (4.46 C)   81 year old female in no acute distress.  NECK:  Supple.  Nontender.  No audible bruit.  HEART:  Appears to be regular. LUNGS:  No crackles or wheezing audible.  Respirations even and unlabored.  ABDOMEN:  Soft, nontender.  Bowel sounds present and normal.  No audible abdominal bruit.   BACK:  Non tender.  No CVA tenderness.        Assessment & Plan:  1. BARRETTS ESOPHAGUS Given worsening symptoms, will adjust PPI as outlined.  Refer back to GI for evaluation.  She is agreeable now.  Has previously declined referral back.   - Ambulatory referral to Gastroenterology  2. Acute cystitis without hematuria Urinary symptoms resolved.  No further abx warranted.    3. Gastroesophageal reflux disease with  esophagitis Worsening reflux despite medications.  Has a h/o Barretts.  Increase prilosec to bid dosing.  Avoid foods that aggravate.  No dysphagia.  Refer to GI.   - Ambulatory referral to Gastroenterology

## 2014-08-11 NOTE — Telephone Encounter (Signed)
I spoke with patient this morning & she reports that she has a cough & she is coughing up phlegm. Patient wanted to know if she could try an OTC cough medication that she has at home? I notified patient that she could try the cough medication for a day or two & let us know if her cough doesn't improve. I also encouraged patient to continue taking her Prilosec BID. Pt was just seen on Friday (1/8) as a walk in.

## 2014-08-11 NOTE — Telephone Encounter (Signed)
Agree.  Ok to take robitussin DM as directed.

## 2014-08-11 NOTE — Telephone Encounter (Signed)
A friend of the pt called to request that someone call the pt to check on her. Pt had told that the she has a cough and has called to the office but is unable to speak with anyone. Pt was called and their was no answer and no voice mail.msn

## 2014-08-15 DIAGNOSIS — L97212 Non-pressure chronic ulcer of right calf with fat layer exposed: Secondary | ICD-10-CM | POA: Diagnosis not present

## 2014-08-20 DIAGNOSIS — H6123 Impacted cerumen, bilateral: Secondary | ICD-10-CM | POA: Diagnosis not present

## 2014-08-20 DIAGNOSIS — H905 Unspecified sensorineural hearing loss: Secondary | ICD-10-CM | POA: Diagnosis not present

## 2014-08-29 DIAGNOSIS — H04123 Dry eye syndrome of bilateral lacrimal glands: Secondary | ICD-10-CM | POA: Diagnosis not present

## 2014-08-29 DIAGNOSIS — H3531 Nonexudative age-related macular degeneration: Secondary | ICD-10-CM | POA: Diagnosis not present

## 2014-08-29 DIAGNOSIS — H52223 Regular astigmatism, bilateral: Secondary | ICD-10-CM | POA: Diagnosis not present

## 2014-08-29 DIAGNOSIS — H4011X3 Primary open-angle glaucoma, severe stage: Secondary | ICD-10-CM | POA: Diagnosis not present

## 2014-08-29 DIAGNOSIS — H1820 Unspecified corneal edema: Secondary | ICD-10-CM | POA: Diagnosis not present

## 2014-08-29 DIAGNOSIS — H5211 Myopia, right eye: Secondary | ICD-10-CM | POA: Diagnosis not present

## 2014-08-29 DIAGNOSIS — H524 Presbyopia: Secondary | ICD-10-CM | POA: Diagnosis not present

## 2014-08-29 DIAGNOSIS — H35363 Drusen (degenerative) of macula, bilateral: Secondary | ICD-10-CM | POA: Diagnosis not present

## 2014-08-29 DIAGNOSIS — H5053 Vertical heterophoria: Secondary | ICD-10-CM | POA: Diagnosis not present

## 2014-08-29 DIAGNOSIS — H5202 Hypermetropia, left eye: Secondary | ICD-10-CM | POA: Diagnosis not present

## 2014-08-29 DIAGNOSIS — H47233 Glaucomatous optic atrophy, bilateral: Secondary | ICD-10-CM | POA: Diagnosis not present

## 2014-09-01 DIAGNOSIS — H4011X3 Primary open-angle glaucoma, severe stage: Secondary | ICD-10-CM | POA: Diagnosis not present

## 2014-09-01 DIAGNOSIS — H35363 Drusen (degenerative) of macula, bilateral: Secondary | ICD-10-CM | POA: Diagnosis not present

## 2014-09-01 DIAGNOSIS — H3531 Nonexudative age-related macular degeneration: Secondary | ICD-10-CM | POA: Diagnosis not present

## 2014-09-01 DIAGNOSIS — H47233 Glaucomatous optic atrophy, bilateral: Secondary | ICD-10-CM | POA: Diagnosis not present

## 2014-09-01 DIAGNOSIS — H1820 Unspecified corneal edema: Secondary | ICD-10-CM | POA: Diagnosis not present

## 2014-09-01 DIAGNOSIS — H04123 Dry eye syndrome of bilateral lacrimal glands: Secondary | ICD-10-CM | POA: Diagnosis not present

## 2014-09-05 DIAGNOSIS — H1820 Unspecified corneal edema: Secondary | ICD-10-CM | POA: Diagnosis not present

## 2014-09-05 DIAGNOSIS — H04123 Dry eye syndrome of bilateral lacrimal glands: Secondary | ICD-10-CM | POA: Diagnosis not present

## 2014-09-05 DIAGNOSIS — H47233 Glaucomatous optic atrophy, bilateral: Secondary | ICD-10-CM | POA: Diagnosis not present

## 2014-09-05 DIAGNOSIS — H4011X3 Primary open-angle glaucoma, severe stage: Secondary | ICD-10-CM | POA: Diagnosis not present

## 2014-09-05 DIAGNOSIS — H35363 Drusen (degenerative) of macula, bilateral: Secondary | ICD-10-CM | POA: Diagnosis not present

## 2014-09-05 DIAGNOSIS — H3531 Nonexudative age-related macular degeneration: Secondary | ICD-10-CM | POA: Diagnosis not present

## 2014-09-08 DIAGNOSIS — H47233 Glaucomatous optic atrophy, bilateral: Secondary | ICD-10-CM | POA: Diagnosis not present

## 2014-09-08 DIAGNOSIS — H1820 Unspecified corneal edema: Secondary | ICD-10-CM | POA: Diagnosis not present

## 2014-09-08 DIAGNOSIS — H04123 Dry eye syndrome of bilateral lacrimal glands: Secondary | ICD-10-CM | POA: Diagnosis not present

## 2014-09-08 DIAGNOSIS — H35363 Drusen (degenerative) of macula, bilateral: Secondary | ICD-10-CM | POA: Diagnosis not present

## 2014-09-08 DIAGNOSIS — H3531 Nonexudative age-related macular degeneration: Secondary | ICD-10-CM | POA: Diagnosis not present

## 2014-09-08 DIAGNOSIS — H4011X3 Primary open-angle glaucoma, severe stage: Secondary | ICD-10-CM | POA: Diagnosis not present

## 2014-09-17 DIAGNOSIS — H18232 Secondary corneal edema, left eye: Secondary | ICD-10-CM | POA: Diagnosis not present

## 2014-09-17 DIAGNOSIS — H35352 Cystoid macular degeneration, left eye: Secondary | ICD-10-CM | POA: Diagnosis not present

## 2014-09-17 DIAGNOSIS — Z947 Corneal transplant status: Secondary | ICD-10-CM | POA: Diagnosis not present

## 2014-09-24 DIAGNOSIS — B351 Tinea unguium: Secondary | ICD-10-CM | POA: Diagnosis not present

## 2014-09-24 DIAGNOSIS — M79674 Pain in right toe(s): Secondary | ICD-10-CM | POA: Diagnosis not present

## 2014-09-24 DIAGNOSIS — M79675 Pain in left toe(s): Secondary | ICD-10-CM | POA: Diagnosis not present

## 2014-09-26 DIAGNOSIS — H35363 Drusen (degenerative) of macula, bilateral: Secondary | ICD-10-CM | POA: Diagnosis not present

## 2014-09-26 DIAGNOSIS — H04123 Dry eye syndrome of bilateral lacrimal glands: Secondary | ICD-10-CM | POA: Diagnosis not present

## 2014-09-26 DIAGNOSIS — H3531 Nonexudative age-related macular degeneration: Secondary | ICD-10-CM | POA: Diagnosis not present

## 2014-09-26 DIAGNOSIS — H47233 Glaucomatous optic atrophy, bilateral: Secondary | ICD-10-CM | POA: Diagnosis not present

## 2014-09-26 DIAGNOSIS — H4011X3 Primary open-angle glaucoma, severe stage: Secondary | ICD-10-CM | POA: Diagnosis not present

## 2014-10-09 DIAGNOSIS — H04123 Dry eye syndrome of bilateral lacrimal glands: Secondary | ICD-10-CM | POA: Diagnosis not present

## 2014-10-09 DIAGNOSIS — H35363 Drusen (degenerative) of macula, bilateral: Secondary | ICD-10-CM | POA: Diagnosis not present

## 2014-10-09 DIAGNOSIS — H3531 Nonexudative age-related macular degeneration: Secondary | ICD-10-CM | POA: Diagnosis not present

## 2014-10-09 DIAGNOSIS — H47233 Glaucomatous optic atrophy, bilateral: Secondary | ICD-10-CM | POA: Diagnosis not present

## 2014-10-09 DIAGNOSIS — H4011X3 Primary open-angle glaucoma, severe stage: Secondary | ICD-10-CM | POA: Diagnosis not present

## 2014-11-03 ENCOUNTER — Other Ambulatory Visit: Payer: Self-pay | Admitting: Internal Medicine

## 2014-11-03 DIAGNOSIS — H35352 Cystoid macular degeneration, left eye: Secondary | ICD-10-CM | POA: Diagnosis not present

## 2014-11-18 DIAGNOSIS — H401433 Capsular glaucoma with pseudoexfoliation of lens, bilateral, severe stage: Secondary | ICD-10-CM | POA: Diagnosis not present

## 2014-11-20 ENCOUNTER — Telehealth: Payer: Self-pay | Admitting: Internal Medicine

## 2014-11-20 NOTE — Telephone Encounter (Signed)
Opened in error

## 2014-11-21 ENCOUNTER — Telehealth: Payer: Self-pay | Admitting: *Deleted

## 2014-11-21 NOTE — Discharge Summary (Signed)
PATIENT NAME:  Christy Cisneros, Christy Cisneros MR#:  [id number] DATE OF BIRTH:  1919/11/09  DATE OF ADMISSION:  12/06/2012 DATE OF DISCHARGE:  12/07/2012  PRIMARY CARE PHYSICIAN:  Dr. Einar Pheasant  FINAL DIAGNOSES: 1.  Dizziness, unsteady gait, nausea, vomiting likely viral labyrinthitis.  2.  Glaucoma.  3.  Macular degeneration.   MEDICATIONS ON DISCHARGE: Include aspirin 81 mg daily, PreserVision oral tablet 1 tablet daily, prednisolone acetate 1% ophthalmic suspension 1 drop twice a day, timolol 0.25% ophthalmic solution 1 drop left eye daily, bilberry/evening primrose/flax 40 mg, 500 mg, 1000 mg oral capsule 1 capsule daily.   HOSPITAL COURSE:  The patient was admitted 12/06/2012 and discharged 12/07/2012. Came in with a chief complaint of dizziness and unsteady gait.   HISTORY OF PRESENT ILLNESS:  A 81 year old female with past medical history of TIAs. She got up very dizzy, very uncomfortable feeling.  Did not improve. The room was moving to the left on her, then she started vomiting and came into the ER for further evaluation. In the ER, she was given Valium, but that had to be reversed with flumazenil. A CT scan of the head was done that showed no acute intracranial process. I was about to send her home from the Emergency Room, but I stood her up to try to walk her and she fell over to the left so I admitted her as an observation.  LABORATORY AND RADIOLOGICAL DATA DURING THE HOSPITAL COURSE:  Included an EKG that showed normal sinus rhythm, sinus arrhythmia. Cardiac enzymes were negative. White blood cell count 6.8, H and H 14.2 and 41.3, platelet count of 271, glucose 121, BUN 15, creatinine 0.58, sodium 137, potassium 3.9, chloride 103, CO2 28, calcium 9.3. Liver function tests normal range.  CT scan of the head: No acute intracranial process.  Urine culture no growth. Urinalysis negative.  MRI of the brain without contrast showed chronic and involutional changes. No acute  changes.  Ultrasound to the carotid:  No hemodynamically significant carotid artery stenosis.  LDL 93, HDL 65, triglycerides 62.  On May 9, the patient felt much better. She walked around well with physical therapy. No loss of balance.  HOSPITAL COURSE PER PROBLEM LIST:  1.  For the patient's dizziness, unsteady gait, nausea, vomiting, this could be a viral labyrinthitis. The patient did get better on her own. No signs of benign positional vertigo upon discharge. MRI of the brain was negative for stroke. The patient walked well with physical therapy, stable for discharge home. 2.  For glaucoma, she was kept on her usual medications. 3.  For macular degeneration, she is on, PreserVision.  TIME SPENT ON DISCHARGE:  35 minutes.    ____________________________ Tana Conch. Leslye Peer, MD rjw:[id number] D: 12/07/2012 15:06:20 ET T: 12/08/2012 08:37:18 ET JOB#: [id number]  cc: Tana Conch. Leslye Peer, MD, <Dictator> Einar Pheasant, MD Marisue Brooklyn MD ELECTRONICALLY SIGNED 12/11/2012 12:13

## 2014-11-21 NOTE — H&P (Signed)
PATIENT NAME:  Christy Cisneros, Christy Cisneros MR#:  [id number] DATE OF BIRTH:  1919/09/18  DATE OF ADMISSION:  12/06/2012  PRIMARY CARE PHYSICIAN: Dr. Einar Pheasant.  CHIEF COMPLAINT: Dizziness.   HISTORY OF PRESENT ILLNESS: This is a 81 year old female with a past medical history of TIAs x 2. This morning she got up she was very dizzy, very uncomfortable, did not get much better, felt like the room was moving on her. She was able to go for a walk. She came back, had breakfast and then started vomiting. She vomited about 5 times and 1 episode of diarrhea. She does not feel weak, one side versus the other, but just feels off. In the ER she was given a dose of Valium, but then had to be reversed with flumazenil. A CT scan of the head was done that showed no acute intracranial process. Hospitalist services were contacted for further evaluation.   PAST MEDICAL HISTORY: TIAs x 2, glaucoma, macular degeneration, acid reflux.   PAST SURGICAL HISTORY: Left shoulder surgery, tonsils, left ear surgery, seven surgeries of the left eye.   ALLERGIES: To numerous medications including: CODEINE, POLYMYXIN, VIGAMOX, BACITRACIN, ALPHAGAN, AVELOX, CODEINE, KEFLEX, BIAXIN, PREVACID AND ERYTHROMYCIN.   MEDICATIONS: Include: Aspirin 81 mg daily, bilberry, evening primrose, flaxseed oil 40 mg/ 500 mg/1000 mg, 1 capsule daily, prednisolone acetate 1% ophthalmic solution 1 drop right eye twice a day, PreserVision 1 tablet daily, Timolol 0.25 mg 1 drop left eye once a day.   SOCIAL HISTORY: No smoking. Occasional wine with lunch. No drug use. She does live alone. She used to do office sales work in the past.   FAMILY HISTORY: Father died in an MVA. Mother died; she did have heart disease.   REVIEW OF SYSTEMS:  CONSTITUTIONAL: No fever, chills, or sweats. No weight gain. No weight loss. No weakness.   EYES: She does wear glasses.  EARS, NOSE, MOUTH, AND THROAT: Decreased hearing; wears hearing aids. No sore throat. No  difficulty swallowing.  CARDIOVASCULAR: No chest pain. No palpitations.  RESPIRATORY: No shortness of breath. No cough. No wheezing. No hemoptysis.  GASTROINTESTINAL: Positive for nausea. Positive for vomiting. No abdominal pain. Positive for 1 episode of diarrhea. No bright red blood per rectum. No melena.  GENITOURINARY: No burning on urination. No hematuria.  MUSCULOSKELETAL: No joint pain or muscle pain.  INTEGUMENT: No rashes or eruptions.  NEUROLOGIC: No fainting or blackouts.  PSYCHIATRIC: No anxiety or depression.  ENDOCRINE: No thyroid problems.  HEMATOLOGIC/LYMPHATIC: No anemia.   PHYSICAL EXAMINATION: VITAL SIGNS: Temperature 98.1, pulse 64, respirations 18, blood pressure 129/61, pulse oximetry 100%.  GENERAL: No respiratory distress.  EYES: Conjunctivae and lids normal. Pupils equal, round, and reactive to light. Extraocular muscles intact. No nystagmus.  EARS, NOSE, MOUTH, AND THROAT: Tympanic membrane on the right: No erythema. Tympanic membrane on the left: I do see a blue thing there that could be an ear tube. I do see wax around it. What I can see of the tympanic membrane is clear. Nasal mucosa: No erythema.  THROAT: No erythema. No exudate seen.  LIPS AND GUMS: No lesions.  NECK: No JVD. No bruits. No lymphadenopathy. No thyromegaly. No thyroid nodules palpated.  RESPIRATORY: Lungs clear to auscultation. No use of accessory muscles to breathe. No rhonchi, rales or wheeze heard.  CARDIOVASCULAR SYSTEM: S1, S2 normal. No gallops, rubs or murmurs heard. Carotid upstrokes 2+ bilaterally. No bruits. Dorsalis pedis pulses 2+ bilaterally. No edema of the lower extremities.  ABDOMEN: Soft, nontender. No organosplenomegaly.  Normoactive bowel sounds. No masses felt.  LYMPHATIC: No lymph nodes felt in the neck.  MUSCULOSKELETAL: No clubbing, edema or cyanosis.  SKIN: No rashes or ulcers seen. The patient does have a chronic lower extremity brownish discoloration.   NEUROLOGIC:  Cranial nerves II-XII grossly intact. Deep tendon reflexes 1+ bilateral lower extremities. Power 5/5, right lower extremity, right upper extremity, left upper extremity; 4/5 in the left lower extremity. Babinski negative.  PSYCHIATRIC: The patient is oriented to person, place and time.   LABORATORY AND RADIOLOGICAL DATA: Troponin negative. White blood cell count 6.8, H and H  14.2 and 41.3, platelet count of 271, glucose 121, BUN 15, creatinine 0.58, sodium 137, potassium 3.9, chloride 103, CO2 28, calcium 9.3.   Liver function tests normal.   CT scan of the head was negative.   ASSESSMENT AND PLAN: 1.  Dizziness and unsteady gait: When I stood her up she almost fell over. Since she did have a little left-sided weakness on her leg I will rule out cerebrovascular accident with an MRI of the brain. Will also get a carotid ultrasound and echocardiogram. The patient is already on an aspirin and received an aspirin in the Emergency Room. Will start a statin. If the patient does have a stroke will have to take a step up in treatment to Aggrenox. This could also be benign positional vertigo. I will give p.r.n. Antivert. Will get a physical therapy evaluation, check orthostatics, check a urinalysis and a urine culture. I do not think that this is an ear infection.  2.  Glaucoma: Continue eyedrops.  3.  Macular degeneration: Continue PreserVision.   Time spent on admission: Fifty-five minutes.   THE PATIENT IS A DNR.    ____________________________ Tana Conch. Leslye Peer, MD rjw:dm D: 12/06/2012 14:30:24 ET T: 12/06/2012 14:53:19 ET JOB#: [id number]  cc: Tana Conch. Leslye Peer, MD, <Dictator> Einar Pheasant, MD Marisue Brooklyn MD ELECTRONICALLY SIGNED 12/11/2012 12:13

## 2014-11-21 NOTE — Telephone Encounter (Signed)
Pt had mailed letter, requesting appt with Dr. Nicki Reaper (has already been scheduled 12/02/14) and requesting Pantoprazole 20 mg for an episode of acid reflux, she had borrowed a friend's and had improvement in symptoms. Requested Rx to [facility] Ok to send in Rx per Dr. Nicki Reaper Pantoprazole 20 mg once daily as long as she can take and has not appts, and as long as she keeps above appt. Called pt, no answer, no VM

## 2014-11-25 ENCOUNTER — Other Ambulatory Visit: Payer: Self-pay | Admitting: *Deleted

## 2014-11-25 ENCOUNTER — Encounter: Payer: Self-pay | Admitting: *Deleted

## 2014-11-25 MED ORDER — PANTOPRAZOLE SODIUM 20 MG PO TBEC: 20.0000 mg | DELAYED_RELEASE_TABLET | Freq: Every day | ORAL | Status: DC

## 2014-11-25 NOTE — Telephone Encounter (Signed)
Left detailed message notifying patient of appt on 12/04/14 @ 12:00 & Rx was sent in for Protonix 20mg .

## 2014-11-26 NOTE — Telephone Encounter (Signed)
Pt confirmed appt on 12/04/14 & cancelled CPE appt on 12/02/14. Pt also states that she picked up her Protonix.

## 2014-12-02 ENCOUNTER — Encounter: Payer: Medicare Other | Admitting: Internal Medicine

## 2014-12-04 ENCOUNTER — Ambulatory Visit (INDEPENDENT_AMBULATORY_CARE_PROVIDER_SITE_OTHER): Payer: Medicare Other | Admitting: Internal Medicine

## 2014-12-04 ENCOUNTER — Encounter: Payer: Self-pay | Admitting: Internal Medicine

## 2014-12-04 VITALS — BP 112/70 | HR 72 | Temp 97.6°F | Ht 59.0 in | Wt 103.4 lb

## 2014-12-04 DIAGNOSIS — K2100 Gastro-esophageal reflux disease with esophagitis, without bleeding: Secondary | ICD-10-CM

## 2014-12-04 DIAGNOSIS — H409 Unspecified glaucoma: Secondary | ICD-10-CM | POA: Diagnosis not present

## 2014-12-04 DIAGNOSIS — K2270 Barrett's esophagus without dysplasia: Secondary | ICD-10-CM

## 2014-12-04 NOTE — Progress Notes (Signed)
Pre visit review using our clinic review tool, if applicable. No additional management support is needed unless otherwise documented below in the visit note. 

## 2014-12-04 NOTE — Progress Notes (Signed)
Patient ID: Christy Cisneros, female   DOB: 24-Oct-1919, 81 y.o.   MRN: [id number]   Subjective:    Patient ID: Christy Cisneros, female    DOB: [birth year], 81 y.o.   MRN: [id number]  HPI  Patient here as a work in to discuss increased acid reflux.  Has noticed increased acid reflux recently.  Took a friends protonix.  Helped.  Better now.  Coffee aggravates.  States if takes protonix and watches what she eats, symptoms controlled.  Staying active.  No cardiac symptoms with increased activity or exertion.  Breathing stable.  Bowels stable.  No abdominal pain or cramping.    Past Medical History  Diagnosis Date  . Chronic bronchitis   . Hypercholesteremia   . GERD (gastroesophageal reflux disease)   . Barrett's esophagus with esophagitis   . Sleep apnea     has CPAP  . Glaucoma   . TIA (transient ischemic attack)   . Osteoporosis   . Osteoarthritis     Current Outpatient Prescriptions on File Prior to Visit  Medication Sig Dispense Refill  . Aloe LIQD Take by mouth daily.     Marland Kitchen aspirin 81 MG tablet Take 81 mg by mouth daily.    . Bilberry, Vaccinium myrtillus, (BILBERRY PO) Take 1 capsule by mouth daily.    . calcium carbonate (OS-CAL) 600 MG TABS Take 600 mg by mouth daily.    . Flaxseed, Linseed, (GROUND FLAX SEEDS PO) Take by mouth daily.     . Multiple Vitamins-Minerals (PRESERVISION AREDS 2 PO) Take by mouth 2 (two) times daily.    . pantoprazole (PROTONIX) 20 MG tablet Take 1 tablet (20 mg total) by mouth daily. 30 tablet 5  . prednisoLONE acetate (PRED FORTE) 1 % ophthalmic suspension Place 1 drop into the left eye 2 (two) times daily.    . timolol (BETIMOL) 0.5 % ophthalmic solution Place 1 drop into the right eye daily.     No current facility-administered medications on file prior to visit.    Review of Systems  Constitutional: Negative for appetite change and unexpected weight change.  HENT: Negative for congestion and sinus pressure.   Respiratory: Negative for  cough, chest tightness and shortness of breath.   Cardiovascular: Negative for chest pain, palpitations and leg swelling.  Gastrointestinal: Negative for nausea, vomiting and abdominal pain.  Genitourinary: Negative for dysuria and difficulty urinating.  Skin: Negative for color change and rash.  Neurological: Negative for dizziness, light-headedness and headaches.  Psychiatric/Behavioral: Negative for dysphoric mood and agitation.       Objective:    Physical Exam  Constitutional: She appears well-developed and well-nourished. No distress.  HENT:  Nose: Nose normal.  Mouth/Throat: Oropharynx is clear and moist.  Neck: Neck supple. No thyromegaly present.  Cardiovascular: Normal rate and regular rhythm.   Pulmonary/Chest: Breath sounds normal. No respiratory distress. She has no wheezes.  Abdominal: Soft. Bowel sounds are normal. There is no tenderness.  Musculoskeletal: She exhibits no edema or tenderness.  Lymphadenopathy:    She has no cervical adenopathy.  Skin: No rash noted. No erythema.  Psychiatric: She has a normal mood and affect. Her behavior is normal.    BP 112/70 mmHg  Pulse 72  Temp(Src) 97.6 F (36.4 C) (Oral)  Ht 4\' 11"  (1.499 m)  Wt 103 lb 6 oz (46.891 kg)  BMI 20.87 kg/m2  SpO2 94% Wt Readings from Last 3 Encounters:  12/04/14 103 lb 6 oz (46.891 kg)  08/08/14 101 lb (45.813  kg)  08/04/14 101 lb (45.813 kg)     Lab Results  Component Value Date   WBC 9.5 04/28/2014   HGB 13.6 04/28/2014   HCT 41.2 04/28/2014   PLT 235 04/28/2014   GLUCOSE 93 12/12/2013   ALT 17 12/12/2013   AST 24 12/12/2013   NA 140 12/12/2013   K 3.9 12/13/2013   CL 104 12/12/2013   CREATININE 0.8 12/12/2013   BUN 21 12/12/2013   CO2 30 12/12/2013   TSH 1.26 12/12/2013       Assessment & Plan:   Problem List Items Addressed This Visit    BARRETTS ESOPHAGUS - Primary    Symptoms as outlined.  Symptoms better/resolved on protonix.  Watching what she eats.   Controlled.  Desires no further testing or evaluation.  Desires no further f/u EGD.       GERD (gastroesophageal reflux disease)    See above.  Symptoms controlled on protonix and if watches her diet.  Follow.        Glaucoma    Seeing Dr Arlis Porta at [facility].  Has f/u and procedure planned 12/08/14.            Einar Pheasant, MD

## 2014-12-07 ENCOUNTER — Encounter: Payer: Self-pay | Admitting: Internal Medicine

## 2014-12-07 NOTE — Assessment & Plan Note (Signed)
Symptoms as outlined.  Symptoms better/resolved on protonix.  Watching what she eats.  Controlled.  Desires no further testing or evaluation.  Desires no further f/u EGD.

## 2014-12-07 NOTE — Assessment & Plan Note (Signed)
Seeing Dr Arlis Porta at [facility].  Has f/u and procedure planned 12/08/14.

## 2014-12-07 NOTE — Assessment & Plan Note (Signed)
See above.  Symptoms controlled on protonix and if watches her diet.  Follow.

## 2014-12-08 DIAGNOSIS — H401423 Capsular glaucoma with pseudoexfoliation of lens, left eye, severe stage: Secondary | ICD-10-CM | POA: Diagnosis not present

## 2014-12-08 DIAGNOSIS — H4089 Other specified glaucoma: Secondary | ICD-10-CM | POA: Diagnosis not present

## 2014-12-08 DIAGNOSIS — H44422 Hypotony of left eye due to ocular fistula: Secondary | ICD-10-CM | POA: Diagnosis not present

## 2014-12-10 DIAGNOSIS — H401433 Capsular glaucoma with pseudoexfoliation of lens, bilateral, severe stage: Secondary | ICD-10-CM | POA: Diagnosis not present

## 2014-12-18 DIAGNOSIS — H04123 Dry eye syndrome of bilateral lacrimal glands: Secondary | ICD-10-CM | POA: Diagnosis not present

## 2014-12-18 DIAGNOSIS — H1820 Unspecified corneal edema: Secondary | ICD-10-CM | POA: Diagnosis not present

## 2014-12-18 DIAGNOSIS — H47233 Glaucomatous optic atrophy, bilateral: Secondary | ICD-10-CM | POA: Diagnosis not present

## 2014-12-18 DIAGNOSIS — H3531 Nonexudative age-related macular degeneration: Secondary | ICD-10-CM | POA: Diagnosis not present

## 2014-12-18 DIAGNOSIS — H35363 Drusen (degenerative) of macula, bilateral: Secondary | ICD-10-CM | POA: Diagnosis not present

## 2014-12-18 DIAGNOSIS — H4011X3 Primary open-angle glaucoma, severe stage: Secondary | ICD-10-CM | POA: Diagnosis not present

## 2014-12-19 ENCOUNTER — Telehealth: Payer: Self-pay | Admitting: Internal Medicine

## 2014-12-19 DIAGNOSIS — H3531 Nonexudative age-related macular degeneration: Secondary | ICD-10-CM | POA: Diagnosis not present

## 2014-12-19 DIAGNOSIS — H47233 Glaucomatous optic atrophy, bilateral: Secondary | ICD-10-CM | POA: Diagnosis not present

## 2014-12-19 DIAGNOSIS — H04123 Dry eye syndrome of bilateral lacrimal glands: Secondary | ICD-10-CM | POA: Diagnosis not present

## 2014-12-19 DIAGNOSIS — H4011X3 Primary open-angle glaucoma, severe stage: Secondary | ICD-10-CM | POA: Diagnosis not present

## 2014-12-19 DIAGNOSIS — H35363 Drusen (degenerative) of macula, bilateral: Secondary | ICD-10-CM | POA: Diagnosis not present

## 2014-12-19 DIAGNOSIS — H1820 Unspecified corneal edema: Secondary | ICD-10-CM | POA: Diagnosis not present

## 2014-12-19 NOTE — Telephone Encounter (Signed)
Placed in blue folder.

## 2014-12-19 NOTE — Telephone Encounter (Signed)
Form for disability placard in physician's placard.

## 2014-12-23 NOTE — Telephone Encounter (Signed)
Form completed and in your box.

## 2014-12-23 NOTE — Telephone Encounter (Signed)
Form mailed to patient.

## 2014-12-24 DIAGNOSIS — H47233 Glaucomatous optic atrophy, bilateral: Secondary | ICD-10-CM | POA: Diagnosis not present

## 2014-12-24 DIAGNOSIS — H1820 Unspecified corneal edema: Secondary | ICD-10-CM | POA: Diagnosis not present

## 2014-12-24 DIAGNOSIS — H04123 Dry eye syndrome of bilateral lacrimal glands: Secondary | ICD-10-CM | POA: Diagnosis not present

## 2014-12-24 DIAGNOSIS — H3531 Nonexudative age-related macular degeneration: Secondary | ICD-10-CM | POA: Diagnosis not present

## 2014-12-24 DIAGNOSIS — H4011X3 Primary open-angle glaucoma, severe stage: Secondary | ICD-10-CM | POA: Diagnosis not present

## 2014-12-24 DIAGNOSIS — H35363 Drusen (degenerative) of macula, bilateral: Secondary | ICD-10-CM | POA: Diagnosis not present

## 2014-12-31 DIAGNOSIS — H35363 Drusen (degenerative) of macula, bilateral: Secondary | ICD-10-CM | POA: Diagnosis not present

## 2014-12-31 DIAGNOSIS — H47233 Glaucomatous optic atrophy, bilateral: Secondary | ICD-10-CM | POA: Diagnosis not present

## 2014-12-31 DIAGNOSIS — H4011X3 Primary open-angle glaucoma, severe stage: Secondary | ICD-10-CM | POA: Diagnosis not present

## 2014-12-31 DIAGNOSIS — H1820 Unspecified corneal edema: Secondary | ICD-10-CM | POA: Diagnosis not present

## 2014-12-31 DIAGNOSIS — H04123 Dry eye syndrome of bilateral lacrimal glands: Secondary | ICD-10-CM | POA: Diagnosis not present

## 2014-12-31 DIAGNOSIS — H3531 Nonexudative age-related macular degeneration: Secondary | ICD-10-CM | POA: Diagnosis not present

## 2015-02-03 DIAGNOSIS — H6123 Impacted cerumen, bilateral: Secondary | ICD-10-CM | POA: Diagnosis not present

## 2015-02-03 DIAGNOSIS — H905 Unspecified sensorineural hearing loss: Secondary | ICD-10-CM | POA: Diagnosis not present

## 2015-02-05 DIAGNOSIS — H4011X3 Primary open-angle glaucoma, severe stage: Secondary | ICD-10-CM | POA: Diagnosis not present

## 2015-02-05 DIAGNOSIS — H47233 Glaucomatous optic atrophy, bilateral: Secondary | ICD-10-CM | POA: Diagnosis not present

## 2015-02-05 DIAGNOSIS — H1820 Unspecified corneal edema: Secondary | ICD-10-CM | POA: Diagnosis not present

## 2015-02-05 DIAGNOSIS — H35363 Drusen (degenerative) of macula, bilateral: Secondary | ICD-10-CM | POA: Diagnosis not present

## 2015-02-05 DIAGNOSIS — H04123 Dry eye syndrome of bilateral lacrimal glands: Secondary | ICD-10-CM | POA: Diagnosis not present

## 2015-02-05 DIAGNOSIS — H3531 Nonexudative age-related macular degeneration: Secondary | ICD-10-CM | POA: Diagnosis not present

## 2015-02-17 DIAGNOSIS — M6751 Plica syndrome, right knee: Secondary | ICD-10-CM | POA: Diagnosis not present

## 2015-02-17 DIAGNOSIS — M25561 Pain in right knee: Secondary | ICD-10-CM | POA: Diagnosis not present

## 2015-02-19 DIAGNOSIS — H1820 Unspecified corneal edema: Secondary | ICD-10-CM | POA: Diagnosis not present

## 2015-02-19 DIAGNOSIS — H4011X3 Primary open-angle glaucoma, severe stage: Secondary | ICD-10-CM | POA: Diagnosis not present

## 2015-02-19 DIAGNOSIS — H47233 Glaucomatous optic atrophy, bilateral: Secondary | ICD-10-CM | POA: Diagnosis not present

## 2015-02-19 DIAGNOSIS — H35363 Drusen (degenerative) of macula, bilateral: Secondary | ICD-10-CM | POA: Diagnosis not present

## 2015-02-19 DIAGNOSIS — H3531 Nonexudative age-related macular degeneration: Secondary | ICD-10-CM | POA: Diagnosis not present

## 2015-02-19 DIAGNOSIS — H04123 Dry eye syndrome of bilateral lacrimal glands: Secondary | ICD-10-CM | POA: Diagnosis not present

## 2015-02-26 ENCOUNTER — Encounter: Payer: Self-pay | Admitting: Internal Medicine

## 2015-02-26 ENCOUNTER — Ambulatory Visit (INDEPENDENT_AMBULATORY_CARE_PROVIDER_SITE_OTHER): Payer: Medicare Other | Admitting: Internal Medicine

## 2015-02-26 VITALS — BP 110/70 | HR 75 | Temp 97.9°F | Resp 18 | Ht 59.0 in | Wt 101.2 lb

## 2015-02-26 DIAGNOSIS — G4733 Obstructive sleep apnea (adult) (pediatric): Secondary | ICD-10-CM

## 2015-02-26 DIAGNOSIS — G459 Transient cerebral ischemic attack, unspecified: Secondary | ICD-10-CM | POA: Diagnosis not present

## 2015-02-26 DIAGNOSIS — E7800 Pure hypercholesterolemia, unspecified: Secondary | ICD-10-CM

## 2015-02-26 DIAGNOSIS — M810 Age-related osteoporosis without current pathological fracture: Secondary | ICD-10-CM

## 2015-02-26 DIAGNOSIS — K2270 Barrett's esophagus without dysplasia: Secondary | ICD-10-CM

## 2015-02-26 LAB — COMPREHENSIVE METABOLIC PANEL
ALBUMIN: 4.1 g/dL (ref 3.5–5.2)
ALK PHOS: 55 U/L (ref 39–117)
ALT: 11 U/L (ref 0–35)
AST: 16 U/L (ref 0–37)
BUN: 19 mg/dL (ref 6–23)
CO2: 31 mEq/L (ref 19–32)
CREATININE: 0.77 mg/dL (ref 0.40–1.20)
Calcium: 9.7 mg/dL (ref 8.4–10.5)
Chloride: 104 mEq/L (ref 96–112)
GFR: 74.39 mL/min (ref >60.00)
Glucose, Bld: 107 mg/dL — ABNORMAL HIGH (ref 70–99)
Potassium: 5.7 mEq/L — ABNORMAL HIGH (ref 3.5–5.1)
Sodium: 141 mEq/L (ref 135–145)
Total Bilirubin: 0.5 mg/dL (ref 0.2–1.2)
Total Protein: 6.6 g/dL (ref 6.0–8.3)

## 2015-02-26 LAB — URINALYSIS, ROUTINE W REFLEX MICROSCOPIC
Bilirubin Urine: NEGATIVE
Hgb urine dipstick: NEGATIVE
KETONES UR: NEGATIVE
LEUKOCYTES UA: NEGATIVE
NITRITE: NEGATIVE
RBC / HPF: NONE SEEN (ref 0–?)
SPECIFIC GRAVITY, URINE: 1.02 (ref 1.000–1.030)
Total Protein, Urine: NEGATIVE
UROBILINOGEN UA: 0.2 (ref 0.0–1.0)
Urine Glucose: NEGATIVE
pH: 6 (ref 5.0–8.0)

## 2015-02-26 LAB — VITAMIN D 25 HYDROXY (VIT D DEFICIENCY, FRACTURES): VITD: 32.54 ng/mL (ref 30.00–100.00)

## 2015-02-26 NOTE — Progress Notes (Signed)
Patient ID: TKAI LARGE, female   DOB: 1919/12/21, 81 y.o.   MRN: [id number]   Subjective:    Patient ID: Christy Cisneros, female    DOB: [birth year], 81 y.o.   MRN: [id number]  HPI  Patient here for a scheduled follow up.  She is walking.  No cardiac symptoms with increased activity or exertion.  No sob.  Eating and drinking well.  No nausea or vomiting.  No acid reflux reported.  Bowels stable.  Needs labs for reclast.  Due.    Past Medical History  Diagnosis Date  . Chronic bronchitis   . Hypercholesteremia   . GERD (gastroesophageal reflux disease)   . Barrett's esophagus with esophagitis   . Sleep apnea     has CPAP  . Glaucoma   . TIA (transient ischemic attack)   . Osteoporosis   . Osteoarthritis     Outpatient Encounter Prescriptions as of 02/26/2015  Medication Sig  . Aloe LIQD Take by mouth daily.   Marland Kitchen aspirin 81 MG tablet Take 81 mg by mouth daily.  . Bilberry, Vaccinium myrtillus, (BILBERRY PO) Take 1 capsule by mouth daily.  . calcium carbonate (OS-CAL) 600 MG TABS Take 600 mg by mouth daily.  . dorzolamide-timolol (COSOPT) 22.3-6.8 MG/ML ophthalmic solution Apply to eye.  Marland Kitchen erythromycin ophthalmic ointment   . Flaxseed, Linseed, (GROUND FLAX SEEDS PO) Take by mouth daily.   Marland Kitchen latanoprost (XALATAN) 0.005 % ophthalmic solution   . Multiple Vitamins-Minerals (PRESERVISION AREDS 2 PO) Take by mouth 2 (two) times daily.  . pantoprazole (PROTONIX) 20 MG tablet Take 1 tablet (20 mg total) by mouth daily.  . prednisoLONE acetate (PRED FORTE) 1 % ophthalmic suspension Place 1 drop into the left eye 2 (two) times daily.  . timolol (BETIMOL) 0.5 % ophthalmic solution Place 1 drop into the right eye daily.   No facility-administered encounter medications on file as of 02/26/2015.    Review of Systems  Constitutional: Negative for appetite change and unexpected weight change.  HENT: Negative for congestion and sinus pressure.   Respiratory: Negative for cough,  chest tightness and shortness of breath.   Cardiovascular: Negative for chest pain, palpitations and leg swelling.  Gastrointestinal: Negative for nausea, vomiting, abdominal pain and diarrhea.  Skin: Negative for color change and rash.  Neurological: Negative for dizziness, light-headedness and headaches.  Psychiatric/Behavioral: Negative for dysphoric mood and agitation.       Objective:    Physical Exam  Constitutional: She appears well-developed and well-nourished. No distress.  HENT:  Nose: Nose normal.  Mouth/Throat: Oropharynx is clear and moist.  Neck: Neck supple. No thyromegaly present.  Cardiovascular: Normal rate and regular rhythm.   Pulmonary/Chest: Breath sounds normal. No respiratory distress. She has no wheezes.  Abdominal: Soft. Bowel sounds are normal. There is no tenderness.  Musculoskeletal: She exhibits no edema or tenderness.  Lymphadenopathy:    She has no cervical adenopathy.  Skin: No rash noted. No erythema.  Psychiatric: She has a normal mood and affect. Her behavior is normal.    BP 110/70 mmHg  Pulse 75  Temp(Src) 97.9 F (36.6 C) (Oral)  Resp 18  Ht 4\' 11"  (1.499 m)  Wt 101 lb 4 oz (45.927 kg)  BMI 20.44 kg/m2  SpO2 97% Wt Readings from Last 3 Encounters:  02/26/15 101 lb 4 oz (45.927 kg)  12/04/14 103 lb 6 oz (46.891 kg)  08/08/14 101 lb (45.813 kg)     Lab Results  Component Value Date  WBC 9.5 04/28/2014   HGB 13.6 04/28/2014   HCT 41.2 04/28/2014   PLT 235 04/28/2014   GLUCOSE 107* 02/26/2015   CHOL 170 12/07/2012   TRIG 62 12/07/2012   HDL 65* 12/07/2012   LDLCALC 93 12/07/2012   ALT 11 02/26/2015   AST 16 02/26/2015   NA 141 02/26/2015   K 4.8 02/27/2015   CL 104 02/26/2015   CREATININE 0.77 02/26/2015   BUN 19 02/26/2015   CO2 31 02/26/2015   TSH 1.26 12/12/2013       Assessment & Plan:   Problem List Items Addressed This Visit    BARRETTS ESOPHAGUS    No symptoms reported today.  She watches what she eats.   Desires no further testing/EGD.  Continue protonix.       HYPERCHOLESTEROLEMIA    Low cholesterol diet and exercise.  Follow.       Obstructive sleep apnea    CPAP      Osteoporosis - Primary    Due to get reclast.  Needs labs.  Follow.        Relevant Orders   Vit D  25 hydroxy (rtn osteoporosis monitoring) (Completed)   Urinalysis, Routine w reflex microscopic (not at [facility])   Comprehensive metabolic panel (Completed)   Transient cerebral ischemia    Continue daily aspirin.  No reoccurring symptoms.           Einar Pheasant, MD

## 2015-02-26 NOTE — Progress Notes (Signed)
Pre visit review using our clinic review tool, if applicable. No additional management support is needed unless otherwise documented below in the visit note. 

## 2015-02-27 ENCOUNTER — Other Ambulatory Visit: Payer: Self-pay | Admitting: Internal Medicine

## 2015-02-27 ENCOUNTER — Other Ambulatory Visit (INDEPENDENT_AMBULATORY_CARE_PROVIDER_SITE_OTHER): Payer: Medicare Other

## 2015-02-27 DIAGNOSIS — E875 Hyperkalemia: Secondary | ICD-10-CM

## 2015-02-27 LAB — POTASSIUM: POTASSIUM: 4.8 meq/L (ref 3.5–5.1)

## 2015-02-28 ENCOUNTER — Encounter: Payer: Self-pay | Admitting: Internal Medicine

## 2015-02-28 NOTE — Assessment & Plan Note (Signed)
No symptoms reported today.  She watches what she eats.  Desires no further testing/EGD.  Continue protonix.

## 2015-02-28 NOTE — Assessment & Plan Note (Signed)
Continue daily aspirin.  No reoccurring symptoms.   

## 2015-02-28 NOTE — Assessment & Plan Note (Signed)
Low cholesterol diet and exercise.  Follow.   

## 2015-02-28 NOTE — Assessment & Plan Note (Signed)
CPAP.  

## 2015-02-28 NOTE — Assessment & Plan Note (Signed)
Due to get reclast.  Needs labs.  Follow.

## 2015-03-02 ENCOUNTER — Encounter: Payer: Self-pay | Admitting: *Deleted

## 2015-03-09 DIAGNOSIS — M79675 Pain in left toe(s): Secondary | ICD-10-CM | POA: Diagnosis not present

## 2015-03-09 DIAGNOSIS — M810 Age-related osteoporosis without current pathological fracture: Secondary | ICD-10-CM | POA: Diagnosis not present

## 2015-03-09 DIAGNOSIS — M79674 Pain in right toe(s): Secondary | ICD-10-CM | POA: Diagnosis not present

## 2015-03-09 DIAGNOSIS — B351 Tinea unguium: Secondary | ICD-10-CM | POA: Diagnosis not present

## 2015-04-02 DIAGNOSIS — H1820 Unspecified corneal edema: Secondary | ICD-10-CM | POA: Diagnosis not present

## 2015-04-02 DIAGNOSIS — H3531 Nonexudative age-related macular degeneration: Secondary | ICD-10-CM | POA: Diagnosis not present

## 2015-04-02 DIAGNOSIS — H35363 Drusen (degenerative) of macula, bilateral: Secondary | ICD-10-CM | POA: Diagnosis not present

## 2015-04-02 DIAGNOSIS — H47233 Glaucomatous optic atrophy, bilateral: Secondary | ICD-10-CM | POA: Diagnosis not present

## 2015-04-02 DIAGNOSIS — H4011X3 Primary open-angle glaucoma, severe stage: Secondary | ICD-10-CM | POA: Diagnosis not present

## 2015-04-02 DIAGNOSIS — H04123 Dry eye syndrome of bilateral lacrimal glands: Secondary | ICD-10-CM | POA: Diagnosis not present

## 2015-04-03 ENCOUNTER — Ambulatory Visit (INDEPENDENT_AMBULATORY_CARE_PROVIDER_SITE_OTHER): Payer: Medicare Other | Admitting: Nurse Practitioner

## 2015-04-03 ENCOUNTER — Encounter: Payer: Self-pay | Admitting: Nurse Practitioner

## 2015-04-03 VITALS — Resp 14 | Ht 59.0 in | Wt 100.2 lb

## 2015-04-03 DIAGNOSIS — N3000 Acute cystitis without hematuria: Secondary | ICD-10-CM | POA: Diagnosis not present

## 2015-04-03 DIAGNOSIS — R300 Dysuria: Secondary | ICD-10-CM

## 2015-04-03 LAB — POCT URINALYSIS DIPSTICK
Bilirubin, UA: NEGATIVE
Glucose, UA: NEGATIVE
Leukocytes, UA: NEGATIVE
Nitrite, UA: NEGATIVE
PH UA: 5.5
RBC UA: NEGATIVE
Spec Grav, UA: 1.025
UROBILINOGEN UA: 0.2

## 2015-04-03 NOTE — Progress Notes (Signed)
Patient ID: Christy Cisneros, female    DOB: 1920/06/07  Age: 81 y.o. MRN: [id number]  CC: Urinary Tract Infection   HPI Christy Cisneros presents for Urinary frequency and urgency x 2 weeks.  1) She reports she has been "wee-weeing" all the time and barely making it to the toilet. She would like to know if she has a UTI. She reports she's been feeling this way for "a couple Sundays".   History Christy Cisneros has a past medical history of Chronic bronchitis; Hypercholesteremia; GERD (gastroesophageal reflux disease); Barrett's esophagus with esophagitis; Sleep apnea; Glaucoma; TIA (transient ischemic attack); Osteoporosis; and Osteoarthritis.   She has past surgical history that includes Bunionectomy and Rotator cuff repair.   Her family history includes Breast cancer in her sister; Heart disease in her mother.She reports that she has never smoked. She has never used smokeless tobacco. She reports that she drinks alcohol. She reports that she does not use illicit drugs.  Outpatient Prescriptions Prior to Visit  Medication Sig Dispense Refill  . Aloe LIQD Take by mouth daily.     Marland Kitchen aspirin 81 MG tablet Take 81 mg by mouth daily.    . Bilberry, Vaccinium myrtillus, (BILBERRY PO) Take 1 capsule by mouth daily.    . calcium carbonate (OS-CAL) 600 MG TABS Take 600 mg by mouth daily.    . dorzolamide-timolol (COSOPT) 22.3-6.8 MG/ML ophthalmic solution Apply to eye.    Marland Kitchen erythromycin ophthalmic ointment     . Flaxseed, Linseed, (GROUND FLAX SEEDS PO) Take by mouth daily.     Marland Kitchen latanoprost (XALATAN) 0.005 % ophthalmic solution     . Multiple Vitamins-Minerals (PRESERVISION AREDS 2 PO) Take by mouth 2 (two) times daily.    . pantoprazole (PROTONIX) 20 MG tablet Take 1 tablet (20 mg total) by mouth daily. 30 tablet 5  . prednisoLONE acetate (PRED FORTE) 1 % ophthalmic suspension Place 1 drop into the left eye 2 (two) times daily.    . timolol (BETIMOL) 0.5 % ophthalmic solution Place 1 drop into the  right eye daily.     No facility-administered medications prior to visit.    ROS Review of Systems  Constitutional: Negative for fever, chills, diaphoresis and fatigue.  Genitourinary: Positive for dysuria, urgency and frequency. Negative for hematuria, flank pain, decreased urine volume, difficulty urinating and pelvic pain.  Skin: Negative for rash.    Objective:  Resp 14  Ht 4\' 11"  (1.499 m)  Wt 100 lb 3.2 oz (45.45 kg)  BMI 20.23 kg/m2  Physical Exam  Constitutional: She is oriented to person, place, and time. She appears well-developed and well-nourished. No distress.  HENT:  Head: Normocephalic and atraumatic.  Right Ear: External ear normal.  Left Ear: External ear normal.  Abdominal: There is no CVA tenderness.  Neurological: She is alert and oriented to person, place, and time.  Skin: Skin is warm and dry. No rash noted. She is not diaphoretic.  Psychiatric: She has a normal mood and affect. Her behavior is normal. Judgment and thought content normal.   Assessment & Plan:   Christy Cisneros was seen today for urinary tract infection.  Diagnoses and all orders for this visit:  Dysuria -     POCT urinalysis dipstick -     Urine Culture  Acute cystitis without hematuria   I am having Ms. Modesitt maintain her prednisoLONE acetate, timolol, calcium carbonate, (Bilberry, Vaccinium myrtillus, (BILBERRY PO)), aspirin, Multiple Vitamins-Minerals (PRESERVISION AREDS 2 PO), (Flaxseed, Linseed, (GROUND FLAX SEEDS PO)), Aloe, pantoprazole,  erythromycin, latanoprost, and dorzolamide-timolol.  No orders of the defined types were placed in this encounter.     Follow-up: Return if symptoms worsen or fail to improve.

## 2015-04-03 NOTE — Progress Notes (Signed)
Pre visit review using our clinic review tool, if applicable. No additional management support is needed unless otherwise documented below in the visit note. 

## 2015-04-03 NOTE — Assessment & Plan Note (Signed)
POCT urine was negative for significant findings. Will obtain a culture today informed patient that it will be Tuesday before she hears back from [place] with results. Will follow.

## 2015-04-05 LAB — URINE CULTURE: Colony Count: 50000

## 2015-04-07 ENCOUNTER — Telehealth: Payer: Self-pay | Admitting: *Deleted

## 2015-04-07 NOTE — Telephone Encounter (Signed)
Patient requested to be called back for her urine results test-Thanks

## 2015-04-15 DIAGNOSIS — H35363 Drusen (degenerative) of macula, bilateral: Secondary | ICD-10-CM | POA: Diagnosis not present

## 2015-04-15 DIAGNOSIS — H3531 Nonexudative age-related macular degeneration: Secondary | ICD-10-CM | POA: Diagnosis not present

## 2015-04-15 DIAGNOSIS — H47233 Glaucomatous optic atrophy, bilateral: Secondary | ICD-10-CM | POA: Diagnosis not present

## 2015-04-15 DIAGNOSIS — H4011X3 Primary open-angle glaucoma, severe stage: Secondary | ICD-10-CM | POA: Diagnosis not present

## 2015-04-15 DIAGNOSIS — H04123 Dry eye syndrome of bilateral lacrimal glands: Secondary | ICD-10-CM | POA: Diagnosis not present

## 2015-04-15 DIAGNOSIS — H1820 Unspecified corneal edema: Secondary | ICD-10-CM | POA: Diagnosis not present

## 2015-04-22 DIAGNOSIS — H1820 Unspecified corneal edema: Secondary | ICD-10-CM | POA: Diagnosis not present

## 2015-04-22 DIAGNOSIS — H3531 Nonexudative age-related macular degeneration: Secondary | ICD-10-CM | POA: Diagnosis not present

## 2015-04-22 DIAGNOSIS — H35363 Drusen (degenerative) of macula, bilateral: Secondary | ICD-10-CM | POA: Diagnosis not present

## 2015-04-22 DIAGNOSIS — H47233 Glaucomatous optic atrophy, bilateral: Secondary | ICD-10-CM | POA: Diagnosis not present

## 2015-04-22 DIAGNOSIS — H4011X3 Primary open-angle glaucoma, severe stage: Secondary | ICD-10-CM | POA: Diagnosis not present

## 2015-04-22 DIAGNOSIS — H04123 Dry eye syndrome of bilateral lacrimal glands: Secondary | ICD-10-CM | POA: Diagnosis not present

## 2015-04-27 DIAGNOSIS — H905 Unspecified sensorineural hearing loss: Secondary | ICD-10-CM | POA: Diagnosis not present

## 2015-04-27 DIAGNOSIS — Z23 Encounter for immunization: Secondary | ICD-10-CM | POA: Diagnosis not present

## 2015-04-27 DIAGNOSIS — H6123 Impacted cerumen, bilateral: Secondary | ICD-10-CM | POA: Diagnosis not present

## 2015-05-21 DIAGNOSIS — H353122 Nonexudative age-related macular degeneration, left eye, intermediate dry stage: Secondary | ICD-10-CM | POA: Diagnosis not present

## 2015-05-21 DIAGNOSIS — H353112 Nonexudative age-related macular degeneration, right eye, intermediate dry stage: Secondary | ICD-10-CM | POA: Diagnosis not present

## 2015-05-21 DIAGNOSIS — H04123 Dry eye syndrome of bilateral lacrimal glands: Secondary | ICD-10-CM | POA: Diagnosis not present

## 2015-05-21 DIAGNOSIS — H47233 Glaucomatous optic atrophy, bilateral: Secondary | ICD-10-CM | POA: Diagnosis not present

## 2015-05-21 DIAGNOSIS — H1820 Unspecified corneal edema: Secondary | ICD-10-CM | POA: Diagnosis not present

## 2015-05-21 DIAGNOSIS — H401123 Primary open-angle glaucoma, left eye, severe stage: Secondary | ICD-10-CM | POA: Diagnosis not present

## 2015-05-21 DIAGNOSIS — H401113 Primary open-angle glaucoma, right eye, severe stage: Secondary | ICD-10-CM | POA: Diagnosis not present

## 2015-05-21 DIAGNOSIS — H35363 Drusen (degenerative) of macula, bilateral: Secondary | ICD-10-CM | POA: Diagnosis not present

## 2015-05-28 ENCOUNTER — Encounter: Payer: Self-pay | Admitting: Internal Medicine

## 2015-05-28 ENCOUNTER — Ambulatory Visit (INDEPENDENT_AMBULATORY_CARE_PROVIDER_SITE_OTHER): Payer: Medicare Other | Admitting: Internal Medicine

## 2015-05-28 VITALS — BP 120/70 | HR 74 | Temp 98.4°F | Resp 17 | Ht 59.0 in | Wt 100.0 lb

## 2015-05-28 DIAGNOSIS — G4733 Obstructive sleep apnea (adult) (pediatric): Secondary | ICD-10-CM | POA: Diagnosis not present

## 2015-05-28 DIAGNOSIS — R2681 Unsteadiness on feet: Secondary | ICD-10-CM

## 2015-05-28 DIAGNOSIS — M810 Age-related osteoporosis without current pathological fracture: Secondary | ICD-10-CM

## 2015-05-28 DIAGNOSIS — K2100 Gastro-esophageal reflux disease with esophagitis, without bleeding: Secondary | ICD-10-CM

## 2015-05-28 DIAGNOSIS — K2270 Barrett's esophagus without dysplasia: Secondary | ICD-10-CM | POA: Diagnosis not present

## 2015-05-28 DIAGNOSIS — Z23 Encounter for immunization: Secondary | ICD-10-CM | POA: Diagnosis not present

## 2015-05-28 DIAGNOSIS — G459 Transient cerebral ischemic attack, unspecified: Secondary | ICD-10-CM | POA: Diagnosis not present

## 2015-05-28 DIAGNOSIS — E7800 Pure hypercholesterolemia, unspecified: Secondary | ICD-10-CM

## 2015-05-28 NOTE — Progress Notes (Signed)
Pre-visit discussion using our clinic review tool. No additional management support is needed unless otherwise documented below in the visit note.  

## 2015-05-28 NOTE — Progress Notes (Signed)
Patient ID: Christy Cisneros, female   DOB: 15-May-1920, 81 y.o.   MRN: [id number]   Subjective:    Patient ID: Christy Cisneros, female    DOB: [birth year], 81 y.o.   MRN: [id number]  HPI  Patient with past history of hypercholesterolemia, GERD and previous TIA.  Comes in today to follow up on these issues.  She reports she is doing relatively well.  Does report noticing some unsteadiness at times.  She exercises regularly.  Walks.  We discussed physical therapy to help with gait training.  She is in agreement.  No chest pain or tightness.  No sob.  No acid reflux.  No abdominal pain or cramping.  Bowels stable.     Past Medical History  Diagnosis Date  . Chronic bronchitis ([place])   . Hypercholesteremia   . GERD (gastroesophageal reflux disease)   . Barrett's esophagus with esophagitis   . Sleep apnea     has CPAP  . Glaucoma   . TIA (transient ischemic attack)   . Osteoporosis   . Osteoarthritis    Past Surgical History  Procedure Laterality Date  . Bunionectomy      left  . Rotator cuff repair      right   Family History  Problem Relation Age of Onset  . Breast cancer Sister   . Heart disease Mother    Social History   Social History  . Marital Status: Single    Spouse Name: N/A  . Number of Children: N/A  . Years of Education: N/A   Social History Main Topics  . Smoking status: Never Smoker   . Smokeless tobacco: Never Used  . Alcohol Use: 0.0 oz/week    0 Standard drinks or equivalent per week     Comment: wine occasional  . Drug Use: No  . Sexual Activity: Not Asked   Other Topics Concern  . None   Social History Narrative    Outpatient Encounter Prescriptions as of 05/28/2015  Medication Sig  . Aloe LIQD Take by mouth daily.   Marland Kitchen aspirin 81 MG tablet Take 81 mg by mouth daily.  . Bilberry, Vaccinium myrtillus, (BILBERRY PO) Take 1 capsule by mouth daily.  . calcium carbonate (OS-CAL) 600 MG TABS Take 600 mg by mouth daily.  . dorzolamide-timolol  (COSOPT) 22.3-6.8 MG/ML ophthalmic solution Apply to eye.  Marland Kitchen erythromycin ophthalmic ointment   . Flaxseed, Linseed, (GROUND FLAX SEEDS PO) Take by mouth daily.   Marland Kitchen latanoprost (XALATAN) 0.005 % ophthalmic solution   . Multiple Vitamins-Minerals (PRESERVISION AREDS 2 PO) Take by mouth 2 (two) times daily.  . pantoprazole (PROTONIX) 20 MG tablet Take 1 tablet (20 mg total) by mouth daily.  . prednisoLONE acetate (PRED FORTE) 1 % ophthalmic suspension Place 1 drop into the left eye 2 (two) times daily.  . timolol (BETIMOL) 0.5 % ophthalmic solution Place 1 drop into the right eye daily.   No facility-administered encounter medications on file as of 05/28/2015.    Review of Systems  Constitutional: Negative for appetite change and unexpected weight change.  HENT: Negative for congestion and sinus pressure.   Eyes: Negative for pain and discharge.  Respiratory: Negative for cough, chest tightness and shortness of breath.   Cardiovascular: Negative for chest pain, palpitations and leg swelling.  Gastrointestinal: Negative for nausea, vomiting, abdominal pain and diarrhea.  Genitourinary: Negative for dysuria and difficulty urinating.  Musculoskeletal: Negative for back pain and joint swelling.  Skin: Negative for color change and rash.  Neurological: Negative for dizziness, light-headedness and headaches.  Psychiatric/Behavioral: Negative for dysphoric mood and agitation.       Objective:    Physical Exam  Constitutional: She appears well-developed and well-nourished. No distress.  HENT:  Nose: Nose normal.  Mouth/Throat: Oropharynx is clear and moist.  Eyes: Conjunctivae are normal. Right eye exhibits no discharge. Left eye exhibits no discharge.  Neck: Neck supple. No thyromegaly present.  Cardiovascular: Normal rate and regular rhythm.   Pulmonary/Chest: Breath sounds normal. No respiratory distress. She has no wheezes.  Abdominal: Soft. Bowel sounds are normal. There is no  tenderness.  Musculoskeletal: She exhibits no edema or tenderness.  Lymphadenopathy:    She has no cervical adenopathy.  Skin: No rash noted. No erythema.  Psychiatric: She has a normal mood and affect. Her behavior is normal.  Nursing note reviewed.   BP 120/70 mmHg  Pulse 74  Temp(Src) 98.4 F (36.9 C) (Oral)  Resp 17  Ht 4\' 11"  (1.499 m)  Wt 100 lb (45.36 kg)  BMI 20.19 kg/m2  SpO2 94% Wt Readings from Last 3 Encounters:  05/28/15 100 lb (45.36 kg)  04/03/15 100 lb 3.2 oz (45.45 kg)  02/26/15 101 lb 4 oz (45.927 kg)     Lab Results  Component Value Date   WBC 9.5 04/28/2014   HGB 13.6 04/28/2014   HCT 41.2 04/28/2014   PLT 235 04/28/2014   GLUCOSE 107* 02/26/2015   CHOL 170 12/07/2012   TRIG 62 12/07/2012   HDL 65* 12/07/2012   LDLCALC 93 12/07/2012   ALT 11 02/26/2015   AST 16 02/26/2015   NA 141 02/26/2015   K 4.8 02/27/2015   CL 104 02/26/2015   CREATININE 0.77 02/26/2015   BUN 19 02/26/2015   CO2 31 02/26/2015   TSH 1.26 12/12/2013       Assessment & Plan:   Problem List Items Addressed This Visit    BARRETTS ESOPHAGUS    No upper symptoms reported today.  Declines further testing/EGD.  On protonix.        Relevant Orders   CBC with Differential/Platelet   GERD (gastroesophageal reflux disease)    On protonix.  Symptoms controlled.        HYPERCHOLESTEROLEMIA    Low cholesterol diet and exercise.  Follow lipid panel.        Relevant Orders   TSH   Lipid panel   Hepatic function panel   Basic metabolic panel   Obstructive sleep apnea    CPAP.       Osteoporosis    Received reclast.  Continue weight bearing exercise.        Transient cerebral ischemia    No reoccurring symptoms.  Continue daily aspirin.        Unsteady gait    Discussed with her today.  Will refer to physical therapy for further evaluation and treatment.         Other Visit Diagnoses    Unsteadiness    -  Primary    Relevant Orders    Ambulatory referral  to Physical Therapy    Need for prophylactic vaccination against Streptococcus pneumoniae (pneumococcus)        Relevant Orders    Pneumococcal polysaccharide vaccine 23-valent greater than or equal to 2yo subcutaneous/IM (Completed)        Einar Pheasant, MD

## 2015-06-01 ENCOUNTER — Ambulatory Visit: Payer: Medicare Other | Admitting: Physical Therapy

## 2015-06-01 ENCOUNTER — Encounter: Payer: Self-pay | Admitting: Internal Medicine

## 2015-06-01 DIAGNOSIS — H353122 Nonexudative age-related macular degeneration, left eye, intermediate dry stage: Secondary | ICD-10-CM | POA: Diagnosis not present

## 2015-06-01 DIAGNOSIS — H04123 Dry eye syndrome of bilateral lacrimal glands: Secondary | ICD-10-CM | POA: Diagnosis not present

## 2015-06-01 DIAGNOSIS — R2681 Unsteadiness on feet: Secondary | ICD-10-CM | POA: Insufficient documentation

## 2015-06-01 DIAGNOSIS — H35363 Drusen (degenerative) of macula, bilateral: Secondary | ICD-10-CM | POA: Diagnosis not present

## 2015-06-01 DIAGNOSIS — H353112 Nonexudative age-related macular degeneration, right eye, intermediate dry stage: Secondary | ICD-10-CM | POA: Diagnosis not present

## 2015-06-01 DIAGNOSIS — H47233 Glaucomatous optic atrophy, bilateral: Secondary | ICD-10-CM | POA: Diagnosis not present

## 2015-06-01 DIAGNOSIS — H401113 Primary open-angle glaucoma, right eye, severe stage: Secondary | ICD-10-CM | POA: Diagnosis not present

## 2015-06-01 DIAGNOSIS — H1820 Unspecified corneal edema: Secondary | ICD-10-CM | POA: Diagnosis not present

## 2015-06-01 DIAGNOSIS — H401123 Primary open-angle glaucoma, left eye, severe stage: Secondary | ICD-10-CM | POA: Diagnosis not present

## 2015-06-01 NOTE — Assessment & Plan Note (Signed)
Low cholesterol diet and exercise.  Follow lipid panel.   

## 2015-06-01 NOTE — Assessment & Plan Note (Signed)
On protonix.  Symptoms controlled.   

## 2015-06-01 NOTE — Assessment & Plan Note (Signed)
Received reclast.  Continue weight bearing exercise.

## 2015-06-01 NOTE — Assessment & Plan Note (Signed)
Discussed with her today.  Will refer to physical therapy for further evaluation and treatment.

## 2015-06-01 NOTE — Assessment & Plan Note (Signed)
No reoccurring symptoms.  Continue daily aspirin.

## 2015-06-01 NOTE — Assessment & Plan Note (Signed)
CPAP.  

## 2015-06-01 NOTE — Assessment & Plan Note (Signed)
No upper symptoms reported today.  Declines further testing/EGD.  On protonix.

## 2015-06-04 ENCOUNTER — Ambulatory Visit: Payer: Medicare Other | Admitting: Physical Therapy

## 2015-06-08 ENCOUNTER — Ambulatory Visit: Payer: Medicare Other | Attending: Internal Medicine | Admitting: Physical Therapy

## 2015-06-08 ENCOUNTER — Encounter: Payer: Medicare Other | Admitting: Physical Therapy

## 2015-06-08 ENCOUNTER — Ambulatory Visit: Payer: Medicare Other | Admitting: Physical Therapy

## 2015-06-08 ENCOUNTER — Encounter: Payer: Self-pay | Admitting: Physical Therapy

## 2015-06-08 DIAGNOSIS — R2681 Unsteadiness on feet: Secondary | ICD-10-CM

## 2015-06-08 DIAGNOSIS — R29898 Other symptoms and signs involving the musculoskeletal system: Secondary | ICD-10-CM

## 2015-06-09 NOTE — Therapy (Signed)
[facility] [address] [place], [address] Phone: [phone]   Fax:  [phone]  Physical Therapy Evaluation  Patient Details  Name: Christy Cisneros MRN: [id number] Date of Birth: [birth year] Referring Provider: Einar Pheasant MD  Encounter Date: 06/08/2015      PT End of Session - 06/08/15 0900    Visit Number 1   Number of Visits 8   Date for PT Re-Evaluation 07/07/15   Authorization Type 1   Authorization Time Period 10   PT Start Time 0800   PT Stop Time 0900   PT Time Calculation (min) 60 min   Activity Tolerance Patient tolerated treatment well   Behavior During Therapy [facility] for tasks assessed/performed      Past Medical History  Diagnosis Date  . Chronic bronchitis ([place])   . Hypercholesteremia   . GERD (gastroesophageal reflux disease)   . Barrett's esophagus with esophagitis   . Sleep apnea     has CPAP  . Glaucoma   . TIA (transient ischemic attack)   . Osteoporosis   . Osteoarthritis     Past Surgical History  Procedure Laterality Date  . Bunionectomy      left  . Rotator cuff repair      right    There were no vitals filed for this visit.  Visit Diagnosis:  Unsteadiness  Weakness of both lower extremities      Subjective Assessment - 06/08/15 0820    Subjective Patient reports she is having difficulty with sit to stand with loss of balance to right. She does exercises with walking in the morning and evening 30 min. each.    Pertinent History Patient reports she began noticing losing balance to right with sit to stand for about a year now. She did try to do exercises for balance about a year ago but could not continue because the exercise classes were in winter.    Limitations Walking;Standing;House hold activities   How long can you walk comfortably? at least 30 min.    Patient Stated Goals She would like to be able to stand up without difficulty and be more steady  on her feet   Currently in Pain? No/denies            [facility] PT Assessment - 06/08/15 1027    Assessment   Medical Diagnosis Unsteadiness (R26.81)   Referring Provider Einar Pheasant MD   Onset Date/Surgical Date 06/01/14   Hand Dominance Right   Next MD Visit unknown   Prior Therapy none   Precautions   Precautions None   Restrictions   Weight Bearing Restrictions No   Balance Screen   Has the patient fallen in the past 6 months No   Has the patient had a decrease in activity level because of a fear of falling?  No   Is the patient reluctant to leave their home because of a fear of falling?  No   Home Environment   Living Environment Private residence   Living Arrangements Alone   Type of [place] to enter   Entrance Stairs-Number of Steps 3   Entrance Stairs-Rails Right   Home Layout One level   [place] - single point;Tub bench;Grab bars - tub/shower   Prior Function   Level of Independence Independent   Vocation Retired   Leisure walking for exercise, reading    Cognition   Overall Cognitive Status Within Functional  Limits for tasks assessed   Observation/Other Assessments   Lower Extremity Functional Scale  33/80       Objective: Gait: ambulating with occassional loss of balance to right, good cadence noted, able to transfer sit to stand without noted loss of balance Unable to single leg stand either LE, unable to stand tandem for >3 seconds Strength: decreased strength right hip flexion 4-/5, abduction 3+/5, ER 3+/5, knee extension 4-/5, flexion 4-/5, left hip ER 4-/5, abduction 4/5, hip flexion 4-/5, knee extension 4/5, flexion 4/5  Treatment: Exercise:  With guidance and instruction of PT: Bridging x 10, clam in side lying x 5 reps with manual resistive exercise, seated hip adduction with ball and glute sets x 10 reps with verbal cues, knee flexion/extenion over ball under foot x 2 min., hip abduction/ER in sitting with  resisitive band x 15 reps with assistance and verbal cues of therapist  Patient response to treatment: patient returned demonstration with assistance of therapist and with verbal cues. Verbalized good understanding of home program        PT Education - 2015-06-15 [phone]    Education provided Yes   Education Details instructed in home program for exercises and discussed reasons for unsteadiness (weakness right LE) home program: bridging, clam in side lying, seated hip adduction with ball and glute sets, knee flexion/extenion over ball under foot, hip abduction/ER in sitting with resisitive band   Person(s) Educated Patient   Methods Explanation;Demonstration;Verbal cues;Handout   Comprehension Verbalized understanding;Returned demonstration;Verbal cues required             PT Long Term Goals - Jun 15, 2015 0910    PT LONG TERM GOAL #1   Title Patient will demonstrate improvement with function based on LEFS score improved to 45/64 by 07/07/2015   Baseline LEFS 33/64 (~50% self perceived impairment with function)   Status New   PT LONG TERM GOAL #2   Title Patient will be independent with home program for strength and balance exercises to allow self management and continue to improve on own by 07/07/2015   Baseline limited to no knowledge of appropriate exercises to perform in order to improve strength and unsteadiness with standing/walking   Status New               Plan - 06/15/15 0900    Clinical Impression Statement Patient is a 81 year old female who presents with weakness in LE's and unsteadiness with standing and moving. She has weakness right hip/LE > left and has limited to no knowledge of appropriate exercises or progression to imrpove strength and steadiness on feet. She will benefit from physicla therapy to address limitations and improve function to prevent falls.    Pt will benefit from skilled therapeutic intervention in order to improve on the following deficits  Difficulty walking;Decreased strength   Rehab Potential Good   Clinical Impairments Affecting Rehab Potential (+) active, motivated  (-) age   PT Frequency 2x / week   PT Duration 4 weeks   PT Treatment/Interventions Patient/family education;Therapeutic exercise;Balance training   PT Next Visit Plan re assess home program, progress strengtheing and balance exercises    PT Home Exercise Plan per handout for hip and LE strengthening and control   Consulted and Agree with Plan of Care Patient          G-Codes - 06/15/2015 0910    Functional Assessment Tool Used LEFS, strength deficits, clinical judgment   Functional Limitation Mobility: Walking and moving around   Mobility:  Walking and Moving Around Current Status [phone]) At least 40 percent but less than 60 percent impaired, limited or restricted   Mobility: Walking and Moving Around Goal Status [phone]) At least 1 percent but less than 20 percent impaired, limited or restricted       Problem List Patient Active Problem List   Diagnosis Date Noted  . Unsteady gait 06/01/2015  . GERD (gastroesophageal reflux disease) 08/11/2014  . UTI (urinary tract infection) 08/07/2014  . Acute bronchitis 11/01/2012  . DYSPNEA 04/17/2009  . HYPERCHOLESTEROLEMIA 04/16/2009  . Obstructive sleep apnea 04/16/2009  . Glaucoma 04/16/2009  . Transient cerebral ischemia 04/16/2009  . BARRETTS ESOPHAGUS 04/16/2009  . OSTEOARTHRITIS 04/16/2009  . Osteoporosis 04/16/2009    Jomarie Longs PT 06/09/2015, 8:43 AM  [facility] [address] [place], [address] Phone: [phone]   Fax:  [phone]  Name: ELLEY HARP MRN: [id number] Date of Birth: 1920-04-29

## 2015-06-11 ENCOUNTER — Encounter: Payer: Medicare Other | Admitting: Physical Therapy

## 2015-06-12 ENCOUNTER — Ambulatory Visit: Payer: Medicare Other | Admitting: Physical Therapy

## 2015-06-12 ENCOUNTER — Encounter: Payer: Self-pay | Admitting: Physical Therapy

## 2015-06-12 DIAGNOSIS — R2681 Unsteadiness on feet: Secondary | ICD-10-CM | POA: Diagnosis not present

## 2015-06-12 DIAGNOSIS — R29898 Other symptoms and signs involving the musculoskeletal system: Secondary | ICD-10-CM

## 2015-06-12 NOTE — Therapy (Signed)
[facility] [address], [place], [address] Phone: [phone]   Fax:  [phone]  Physical Therapy Treatment  Patient Details  Name: Christy Cisneros MRN: [id number] Date of Birth: 1919/10/12 Referring Provider: Einar Pheasant MD  Encounter Date: 06/12/2015      PT End of Session - 06/12/15 0935    Visit Number 2   Number of Visits 8   Date for PT Re-Evaluation 07/07/15   Authorization Type 2   Authorization Time Period 10   PT Start Time [phone]   PT Stop Time 0930   PT Time Calculation (min) 35 min   Activity Tolerance Patient tolerated treatment well   Behavior During Therapy [facility] for tasks assessed/performed      Past Medical History  Diagnosis Date  . Chronic bronchitis ([place])   . Hypercholesteremia   . GERD (gastroesophageal reflux disease)   . Barrett's esophagus with esophagitis   . Sleep apnea     has CPAP  . Glaucoma   . TIA (transient ischemic attack)   . Osteoporosis   . Osteoarthritis     Past Surgical History  Procedure Laterality Date  . Bunionectomy      left  . Rotator cuff repair      right    There were no vitals filed for this visit.  Visit Diagnosis:  Unsteadiness  Weakness of both lower extremities      Subjective Assessment - 06/12/15 0856    Subjective Patient reports she has been busy with guests in her home so she has not been able to exercise much.    Limitations Walking;Standing;House hold activities   Patient Stated Goals She would like to be able to stand up without difficulty and be more steady on her feet   Currently in Pain? No/denies           [facility] Adult PT Treatment/Exercise - 06/12/15 0857    Exercises   Exercises Other Exercises   Other Exercises  With guidance and instruction of PT: Bridging x 10, hip abduction with resistive band around thighs x 10 clam in side lying x 10 reps with light to moderate manual resistive exercise, hip  and knee flexion/extension in sidelying with assistance to un weight LE x 10 each, hip abduction with assistance to un weight LE x 10 reps each LE, seated hip adduction with ball and glute sets x 10 reps with verbal cues, knee flexion/extenion over ball under foot x 2 min. With 2# weight on ankles, hip abduction/ER in sitting with resisitive band x 15 reps with assistance and verbal cues of therapist, standing at counter for side stepping and forward and backward walking x 1 min. Each with contact guarding for safety and verbal cuing to perform with correct foot placement and step length     Patient response to treatment: improved ability to perform exercises with guidance and verbal cues of therapist, continues to require verbal cues for walking exercises for increasing base of support and step length, required assistance for all side lying exercises due to weakness in hip musculature, improved strength/motor control noted with repetition           PT Education - 06/12/15 0932    Education provided Yes   Education Details re assessed home exercise and added walking forward and backward along counter and side stepping along counter with assistance, contact guard and verbal cuing   Person(s) Educated Patient   Methods Explanation;Demonstration;Verbal cues;Handout;Other (comment)  contact guarding for safety   Comprehension Verbalized understanding;Returned demonstration;Verbal cues required             PT Long Term Goals - 06/08/15 0910    PT LONG TERM GOAL #1   Title Patient will demonstrate improvement with function based on LEFS score improved to 45/64 by 07/07/2015   Baseline LEFS 33/64 (~50% self perceived impairment with function)   Status New   PT LONG TERM GOAL #2   Title Patient will be independent with home program for strength and balance exercises to allow self management and continue to improve on own by 07/07/2015   Baseline limited to no knowledge of appropriate  exercises to perform in order to improve strength and unsteadiness with standing/walking   Status New               Plan - 06/12/15 0936    Clinical Impression Statement Patient is progressing well with exercies and strength. She required demonstration, verbal cues and contact guard to perform exercises safely and with correct positioning. She did not have any loss of balance to right throughout session today.    Pt will benefit from skilled therapeutic intervention in order to improve on the following deficits Difficulty walking;Decreased strength   Rehab Potential Good   PT Frequency 2x / week   PT Duration 4 weeks   PT Treatment/Interventions Patient/family education;Therapeutic exercise;Balance training   PT Next Visit Plan  progress strengtheing and balance exercises         Problem List Patient Active Problem List   Diagnosis Date Noted  . Unsteady gait 06/01/2015  . GERD (gastroesophageal reflux disease) 08/11/2014  . UTI (urinary tract infection) 08/07/2014  . Acute bronchitis 11/01/2012  . DYSPNEA 04/17/2009  . HYPERCHOLESTEROLEMIA 04/16/2009  . Obstructive sleep apnea 04/16/2009  . Glaucoma 04/16/2009  . Transient cerebral ischemia 04/16/2009  . BARRETTS ESOPHAGUS 04/16/2009  . OSTEOARTHRITIS 04/16/2009  . Osteoporosis 04/16/2009    Jomarie Longs PT 06/12/2015, 10:10 PM  [facility] [address], [place], [address] Phone: [phone]   Fax:  [phone]  Name: Christy Cisneros MRN: [id number] Date of Birth: 24-Nov-1919

## 2015-06-15 ENCOUNTER — Encounter: Payer: Self-pay | Admitting: Physical Therapy

## 2015-06-15 ENCOUNTER — Encounter: Payer: Self-pay | Admitting: Family Medicine

## 2015-06-15 ENCOUNTER — Ambulatory Visit (INDEPENDENT_AMBULATORY_CARE_PROVIDER_SITE_OTHER): Payer: Medicare Other | Admitting: Family Medicine

## 2015-06-15 ENCOUNTER — Ambulatory Visit: Payer: Medicare Other | Admitting: Physical Therapy

## 2015-06-15 VITALS — BP 136/78 | HR 88 | Temp 98.1°F | Ht 59.0 in | Wt 102.2 lb

## 2015-06-15 DIAGNOSIS — R05 Cough: Secondary | ICD-10-CM

## 2015-06-15 DIAGNOSIS — R079 Chest pain, unspecified: Secondary | ICD-10-CM | POA: Diagnosis not present

## 2015-06-15 DIAGNOSIS — J209 Acute bronchitis, unspecified: Secondary | ICD-10-CM

## 2015-06-15 DIAGNOSIS — R29898 Other symptoms and signs involving the musculoskeletal system: Secondary | ICD-10-CM

## 2015-06-15 DIAGNOSIS — R2681 Unsteadiness on feet: Secondary | ICD-10-CM

## 2015-06-15 DIAGNOSIS — R059 Cough, unspecified: Secondary | ICD-10-CM

## 2015-06-15 NOTE — Progress Notes (Signed)
Pre visit review using our clinic review tool, if applicable. No additional management support is needed unless otherwise documented below in the visit note. 

## 2015-06-15 NOTE — Progress Notes (Signed)
Patient ID: Christy Cisneros, female   DOB: 12-13-1919, 81 y.o.   MRN: [id number]  Tommi Rumps, MD Phone: [phone]  Christy Cisneros is a 81 y.o. female who presents today for same-day visit.  Patient reports 1 day of cough and sore throat. She notes sore throat started yesterday and progressively got worse throughout the day. Then she began to cough. She has had postnasal drip for some time. She notes her chest feels congested. She does note there is a discomfort centrally in her chest when she coughs, though notes it is present intermittently at rest. She notes she has a difficult time at describing this pain. She is able to point to a specific area of her sternum where the discomfort is. She does not have any shortness of breath with this. She denies rhinorrhea, sinus congestion, fevers, diaphoresis, radiation of the chest discomfort, exertional component chest discomfort, and history of cardiac issues. She's had some postnasal drip and phlegm for a while. She gargled with salt water with some benefit. She is also use Robitussin with some benefit. She has not gone on any long trips, or had any surgeries recently, or had any swelling in her calves. She denies history of PE or DVT.  PMH: nonsmoker.   ROS see history of present illness  Objective  Physical Exam Filed Vitals:   06/15/15 1004  BP: 136/78  Pulse: 88  Temp: 98.1 F (36.7 C)    Physical Exam  Constitutional: She is well-developed, well-nourished, and in no distress.  HENT:  Head: Normocephalic and atraumatic.  Right Ear: External ear normal.  Left Ear: External ear normal.  Mouth/Throat: Oropharynx is clear and moist. No oropharyngeal exudate.  Normal TMs bilaterally  Eyes: Conjunctivae are normal. Pupils are equal, round, and reactive to light.  Neck: Neck supple.  Cardiovascular: Normal rate, regular rhythm and normal heart sounds.  Exam reveals no gallop and no friction rub.   No murmur  heard. Pulmonary/Chest: Effort normal and breath sounds normal. No respiratory distress. She has no wheezes. She has no rales.  Lymphadenopathy:    She has no cervical adenopathy.  Neurological: She is alert. Gait normal.  Skin: Skin is warm and dry. She is not diaphoretic.   no calf swelling or tenderness, no cords palpated Minimal costochondral tenderness on palpation of costochondral joints  EKG: Normal sinus rhythm, rate 81, 2 PACs noted, no ST or T-wave changes  Assessment/Plan: Please see individual problem list.  Acute bronchitis Symptoms most consistent with bronchitis. Likely viral in nature given short duration. Patient is well-appearing. Given her difficulty in explaining the chest discomfort that she has been having with this we obtained an EKG. Her EKG was reassuring and pain atypical making cardiac cause an unlikely cause. No risk factors for PE and with normal heart rate and oxygenation this would be an unlikely cause. Doubt pneumonia as cause given normal lung sounds, lack of fever, and normal oxygenation. Rapid flu negative. Her chest discomfort is atypical and is most likely related to bronchitis. We will obtain a chest x-ray to evaluate this further. She'll continue Robitussin for cough. Continue to monitor. She is given return precautions.    Orders Placed This Encounter  Procedures  . DG Chest 2 View    Standing Status: Future     Number of Occurrences:      Standing Expiration Date: 08/14/2016    Order Specific Question:  Reason for Exam (SYMPTOM  OR DIAGNOSIS REQUIRED)    Answer:  cough,  chest pain    Order Specific Question:  Preferred imaging location?    Answer:  [place] Influenza A/B  . EKG 12-Lead    Tommi Rumps

## 2015-06-15 NOTE — Assessment & Plan Note (Addendum)
Symptoms most consistent with bronchitis. Likely viral in nature given short duration. Patient is well-appearing. Given her difficulty in explaining the chest discomfort that she has been having with this we obtained an EKG. Her EKG was reassuring and pain atypical making cardiac cause an unlikely cause. No risk factors for PE and with normal heart rate and oxygenation this would be an unlikely cause. Doubt pneumonia as cause given normal lung sounds, lack of fever, and normal oxygenation. Rapid flu negative. Her chest discomfort is atypical and is most likely related to bronchitis. We will obtain a chest x-ray to evaluate this further. She'll continue Robitussin for cough. Continue to monitor. She is given return precautions.

## 2015-06-15 NOTE — Therapy (Signed)
[facility] [address] [place], [address] Phone: [phone]   Fax:  [phone]  Physical Therapy Treatment  Patient Details  Name: Christy Cisneros MRN: [id number] Date of Birth: 04/03/1920 Referring Provider: Einar Pheasant MD  Encounter Date: 06/15/2015      PT End of Session - 06/15/15 0803    Visit Number 3   Number of Visits 8   Date for PT Re-Evaluation 07/07/15   Authorization Type 3   Authorization Time Period 10   PT Start Time 0802   PT Stop Time 0845   PT Time Calculation (min) 43 min   Activity Tolerance Patient tolerated treatment well   Behavior During Therapy [facility] for tasks assessed/performed      Past Medical History  Diagnosis Date  . Chronic bronchitis ([place])   . Hypercholesteremia   . GERD (gastroesophageal reflux disease)   . Barrett's esophagus with esophagitis   . Sleep apnea     has CPAP  . Glaucoma   . TIA (transient ischemic attack)   . Osteoporosis   . Osteoarthritis     Past Surgical History  Procedure Laterality Date  . Bunionectomy      left  . Rotator cuff repair      right    There were no vitals filed for this visit.  Visit Diagnosis:  Unsteadiness  Weakness of both lower extremities      Subjective Assessment - 06/15/15 0806    Subjective Patient reports she is still not exercising consistently at home but is not noticing as much "lurching to thr right " when she stands up from sitting in chairs.    Patient Stated Goals She would like to be able to stand up without difficulty and be more steady on her feet   Currently in Pain? No/denies       Objective: Gait: ambulating independently without AD with slow cadence, no loss of balance noted Strength: right hip abduction 3-/5, ER 3/5, left hip abduction 3-/5, ER 3+/5         [facility] Adult PT Treatment/Exercise - 06/15/15 0808    Exercises   Exercises Other Exercises   Other Exercises   With guidance and instruction of PT: Bridging x 10 with ball between knees, hip abduction with resistive band around thighs x 10, clam in side lying x 12 reps with light to moderate manual resistive exercise, hip and knee flexion/extension in sidelying with assistance to un weight LE x 15 each, hip abduction with assistance to un weight LE x 10 reps each LE, Seated tap balance stones in front, to side and lateral to knees x 15 reps each with 2# weights on ankles, knee flexion/extenion over ball under foot x 2 min. With 2# weight on ankles, hip abduction/ER in sitting with resisitive band x 15 reps with assistance and verbal cues of therapist both legs and single leg, standing at counter for side stepping and forward and backward walking x 1 10 reps each with resistive band and assistance of therapist to grade resistance Each with contact guarding for safety and verbal cuing to perform with correct foot placement and step length, step ups on 4" step leading with right x 10, left x 10 and lateral step up and overs x 10 with verbal cues and demonstration given, tap balance stones in front x 15 reps each in standing (all with using counter for balance), seated core control with forward elevation of UE's and  abduction x 15 reps with demonstration and guidance         Patient response to treatment: improved ability to perform exercises with guidance and verbal cues of therapist, continues to require verbal cues for walking exercises for increasing base of support and step length with walking exercises and contact guard/close supervision for safety, required assistance for all side lying exercises due to weakness in hip musculature, improved strength/motor control noted with repetition         PT Long Term Goals - 06/08/15 0910    PT LONG TERM GOAL #1   Title Patient will demonstrate improvement with function based on LEFS score improved to 45/64 by 07/07/2015   Baseline LEFS 33/64 (~50% self perceived  impairment with function)   Status New   PT LONG TERM GOAL #2   Title Patient will be independent with home program for strength and balance exercises to allow self management and continue to improve on own by 07/07/2015   Baseline limited to no knowledge of appropriate exercises to perform in order to improve strength and unsteadiness with standing/walking   Status New     Patient education:   instructed in proper performance of walkng exercises for home, re instructed for side stepping and verbal cues and demonstration for all other exercises Patient verbalized understanding and performed exercises with demonstration, verbal cuing         Plan - 06/15/15 0853    Clinical Impression Statement Patient demonstrates improving strength in LE's with increased endurance with exercises and no reported difficulty with sit to stand in th past week. She continues with weakness in right LE hip musculature as compared to left LE and requries cuing to perform most exercises.    Pt will benefit from skilled therapeutic intervention in order to improve on the following deficits Difficulty walking;Decreased strength   Rehab Potential Good   PT Frequency 2x / week   PT Duration 4 weeks   PT Treatment/Interventions Patient/family education;Therapeutic exercise;Balance training   PT Next Visit Plan  progress strengtheing and balance exercises         Problem List Patient Active Problem List   Diagnosis Date Noted  . Unsteady gait 06/01/2015  . GERD (gastroesophageal reflux disease) 08/11/2014  . UTI (urinary tract infection) 08/07/2014  . Acute bronchitis 11/01/2012  . DYSPNEA 04/17/2009  . HYPERCHOLESTEROLEMIA 04/16/2009  . Obstructive sleep apnea 04/16/2009  . Glaucoma 04/16/2009  . Transient cerebral ischemia 04/16/2009  . BARRETTS ESOPHAGUS 04/16/2009  . OSTEOARTHRITIS 04/16/2009  . Osteoporosis 04/16/2009    Jomarie Longs PT 06/15/2015, 9:07 AM  [facility] [address], [place], [address] Phone: [phone]   Fax:  [phone]  Name: Christy Cisneros MRN: [id number] Date of Birth: Aug 25, 1919

## 2015-06-15 NOTE — Patient Instructions (Addendum)
Nice to meet you. Your symptoms are most likely related to bronchitis. You can continue Robitussin for cough. Please follow directions on the package. Please go get the chest x-ray to evaluate this further. If you develop chest pain, shortness of breath, palpitations, cough productive of blood, fevers, chills, feel poorly, or change in symptoms or new symptoms please seek medical attention immediately.

## 2015-06-16 ENCOUNTER — Ambulatory Visit (INDEPENDENT_AMBULATORY_CARE_PROVIDER_SITE_OTHER): Payer: Medicare Other | Admitting: Internal Medicine

## 2015-06-16 ENCOUNTER — Other Ambulatory Visit (INDEPENDENT_AMBULATORY_CARE_PROVIDER_SITE_OTHER): Payer: Medicare Other

## 2015-06-16 ENCOUNTER — Encounter: Payer: Self-pay | Admitting: Internal Medicine

## 2015-06-16 ENCOUNTER — Ambulatory Visit
Admission: RE | Admit: 2015-06-16 | Discharge: 2015-06-16 | Disposition: A | Discharge status: Home / Self Care | Payer: Medicare Other | Source: Ambulatory Visit | Attending: Family Medicine | Admitting: Family Medicine

## 2015-06-16 ENCOUNTER — Telehealth: Payer: Self-pay

## 2015-06-16 ENCOUNTER — Encounter: Payer: Medicare Other | Admitting: Physical Therapy

## 2015-06-16 ENCOUNTER — Ambulatory Visit
Admission: RE | Admit: 2015-06-16 | Discharge: 2015-06-16 | Disposition: A | Discharge status: Home / Self Care | Payer: Medicare Other | Source: Ambulatory Visit | Attending: Internal Medicine | Admitting: Internal Medicine

## 2015-06-16 VITALS — BP 140/80 | HR 90 | Temp 97.9°F | Resp 17 | Ht 59.0 in | Wt 99.0 lb

## 2015-06-16 DIAGNOSIS — H538 Other visual disturbances: Secondary | ICD-10-CM | POA: Diagnosis not present

## 2015-06-16 DIAGNOSIS — R079 Chest pain, unspecified: Secondary | ICD-10-CM | POA: Diagnosis present

## 2015-06-16 DIAGNOSIS — R05 Cough: Secondary | ICD-10-CM

## 2015-06-16 DIAGNOSIS — E7800 Pure hypercholesterolemia, unspecified: Secondary | ICD-10-CM

## 2015-06-16 DIAGNOSIS — J449 Chronic obstructive pulmonary disease, unspecified: Secondary | ICD-10-CM | POA: Insufficient documentation

## 2015-06-16 DIAGNOSIS — J322 Chronic ethmoidal sinusitis: Secondary | ICD-10-CM | POA: Insufficient documentation

## 2015-06-16 DIAGNOSIS — R519 Headache, unspecified: Secondary | ICD-10-CM

## 2015-06-16 DIAGNOSIS — K2270 Barrett's esophagus without dysplasia: Secondary | ICD-10-CM | POA: Diagnosis not present

## 2015-06-16 DIAGNOSIS — R51 Headache: Secondary | ICD-10-CM | POA: Diagnosis not present

## 2015-06-16 DIAGNOSIS — R059 Cough, unspecified: Secondary | ICD-10-CM

## 2015-06-16 LAB — LIPID PANEL
Cholesterol: 220 mg/dL — ABNORMAL HIGH (ref 0–200)
HDL: 82.2 mg/dL (ref >39.00)
LDL Cholesterol: 125 mg/dL — ABNORMAL HIGH (ref 0–99)
NonHDL: 138.26
TRIGLYCERIDES: 64 mg/dL (ref 0.0–149.0)
Total CHOL/HDL Ratio: 3
VLDL: 12.8 mg/dL (ref 0.0–40.0)

## 2015-06-16 LAB — CBC WITH DIFFERENTIAL/PLATELET
Basophils Absolute: 0 10*3/uL (ref 0.0–0.1)
Basophils Relative: 0.4 % (ref 0.0–3.0)
Eosinophils Absolute: 0 10*3/uL (ref 0.0–0.7)
Eosinophils Relative: 0 % (ref 0.0–5.0)
HCT: 43.6 % (ref 36.0–46.0)
Hemoglobin: 14.5 g/dL (ref 12.0–15.0)
Lymphocytes Relative: 5.3 % — ABNORMAL LOW (ref 12.0–46.0)
Lymphs Abs: 0.6 10*3/uL — ABNORMAL LOW (ref 0.7–4.0)
MCHC: 33.2 g/dL (ref 30.0–36.0)
MCV: 91.3 fl (ref 78.0–100.0)
Monocytes Absolute: 0.9 10*3/uL (ref 0.1–1.0)
Monocytes Relative: 7.3 % (ref 3.0–12.0)
Neutro Abs: 10.7 10*3/uL — ABNORMAL HIGH (ref 1.4–7.7)
Neutrophils Relative %: 87 % — ABNORMAL HIGH (ref 43.0–77.0)
Platelets: 272 10*3/uL (ref 150.0–400.0)
RBC: 4.78 Mil/uL (ref 3.87–5.11)
RDW: 14.1 % (ref 11.5–15.5)
WBC: 12.3 10*3/uL — ABNORMAL HIGH (ref 4.0–10.5)

## 2015-06-16 LAB — HEPATIC FUNCTION PANEL
ALT: 15 U/L (ref 0–35)
AST: 20 U/L (ref 0–37)
Albumin: 4.3 g/dL (ref 3.5–5.2)
Alkaline Phosphatase: 58 U/L (ref 39–117)
BILIRUBIN DIRECT: 0.1 mg/dL (ref 0.0–0.3)
BILIRUBIN TOTAL: 0.7 mg/dL (ref 0.2–1.2)
Total Protein: 7 g/dL (ref 6.0–8.3)

## 2015-06-16 LAB — BASIC METABOLIC PANEL
BUN: 13 mg/dL (ref 6–23)
CO2: 30 mEq/L (ref 19–32)
Calcium: 9.7 mg/dL (ref 8.4–10.5)
Chloride: 99 mEq/L (ref 96–112)
Creatinine, Ser: 0.73 mg/dL (ref 0.40–1.20)
GFR: 79.06 mL/min (ref >60.00)
Glucose, Bld: 99 mg/dL (ref 70–99)
POTASSIUM: 4.2 meq/L (ref 3.5–5.1)
Sodium: 138 mEq/L (ref 135–145)

## 2015-06-16 LAB — TSH: TSH: 1.13 u[IU]/mL (ref 0.35–4.50)

## 2015-06-16 MED ORDER — AMOXICILLIN 500 MG PO CAPS: 500.0000 mg | ORAL_CAPSULE | Freq: Three times a day (TID) | ORAL | Status: DC

## 2015-06-16 NOTE — Progress Notes (Signed)
Pre-visit discussion using our clinic review tool. No additional management support is needed unless otherwise documented below in the visit note.  

## 2015-06-16 NOTE — Telephone Encounter (Signed)
Spoke with pt in person.  Discussed results of CT scan.  She is feeling some better.  Her head is better.  Discussed f/u.  She is to call tomorrow with update on condition.  If any worsening, will need to be evaluated.  Discussed hospitalization.   She would like to treat as outpatient, but if any worsening - did agree would be seen.

## 2015-06-16 NOTE — Telephone Encounter (Signed)
Pt would like the results of his CT results done today. I sent this to you first to make sure you had reviewed them

## 2015-06-16 NOTE — Assessment & Plan Note (Signed)
Increased pressure - sinuses and top of her head.  Started over the last couple of days.  I believe the headache is coming from the infection (sinus).  Discussed at length with her.  No focal findings on exam.  No dizziness.  No vision change.  Will obtain CT head given the increased pressure.  Treat sinus infection as outlined.  Follow closely.  Any worsening change in symptoms or problems, she is to be evaluated immediately.  Will see her back in the next few days.

## 2015-06-16 NOTE — Progress Notes (Signed)
Patient ID: Christy Cisneros, female   DOB: Sep 16, 1919, 81 y.o.   MRN: [id number]   Subjective:    Patient ID: Christy Cisneros, female    DOB: May 09, 1920, 81 y.o.   MRN: [id number]  HPI  Patient with past history of chronic bronchitis, GERD, hypercholesterolemia and sleep apnea.  She comes in today with concerns regarding nasal congestion and headache.  States headache started a few days ago.  Describes as pressure - frontal and maxillary sinus and pressure on the top of her head.  No dizziness.  No change in her gait.  No change in her vision.  Increased nasal congestion - productive of colored mucus.  Some drainage. Voice - hoarse.  Some cough.  No chest congestion.  No sob.  No chest tightness.  No wheezing.  Some nausea.  No vomiting.  Decreased appetite.  Is eating.  Trying to stay hydrated.  Bowels stable.     Past Medical History  Diagnosis Date  . Chronic bronchitis ([place])   . Hypercholesteremia   . GERD (gastroesophageal reflux disease)   . Barrett's esophagus with esophagitis   . Sleep apnea     has CPAP  . Glaucoma   . TIA (transient ischemic attack)   . Osteoporosis   . Osteoarthritis    Past Surgical History  Procedure Laterality Date  . Bunionectomy      left  . Rotator cuff repair      right   Family History  Problem Relation Age of Onset  . Breast cancer Sister   . Heart disease Mother    Social History   Social History  . Marital Status: Single    Spouse Name: N/A  . Number of Children: N/A  . Years of Education: N/A   Social History Main Topics  . Smoking status: Never Smoker   . Smokeless tobacco: Never Used  . Alcohol Use: 0.0 oz/week    0 Standard drinks or equivalent per week     Comment: wine occasional  . Drug Use: No  . Sexual Activity: Not Asked   Other Topics Concern  . None   Social History Narrative    Outpatient Encounter Prescriptions as of 06/16/2015  Medication Sig  . Aloe LIQD Take by mouth daily.   Marland Kitchen aspirin 81 MG  tablet Take 81 mg by mouth daily.  . Bilberry, Vaccinium myrtillus, (BILBERRY PO) Take 1 capsule by mouth daily.  . calcium carbonate (OS-CAL) 600 MG TABS Take 600 mg by mouth daily.  . dorzolamide-timolol (COSOPT) 22.3-6.8 MG/ML ophthalmic solution Apply to eye.  Marland Kitchen erythromycin ophthalmic ointment   . Flaxseed, Linseed, (GROUND FLAX SEEDS PO) Take by mouth daily.   Marland Kitchen latanoprost (XALATAN) 0.005 % ophthalmic solution   . Multiple Vitamins-Minerals (PRESERVISION AREDS 2 PO) Take by mouth 2 (two) times daily.  . prednisoLONE acetate (PRED FORTE) 1 % ophthalmic suspension Place 1 drop into the left eye 2 (two) times daily.  . timolol (BETIMOL) 0.5 % ophthalmic solution Place 1 drop into the right eye daily.  . [DISCONTINUED] pantoprazole (PROTONIX) 20 MG tablet Take 1 tablet (20 mg total) by mouth daily.  Marland Kitchen amoxicillin (AMOXIL) 500 MG capsule Take 1 capsule (500 mg total) by mouth 3 (three) times daily.  Marland Kitchen omeprazole (PRILOSEC) 20 MG capsule    No facility-administered encounter medications on file as of 06/16/2015.    Review of Systems  Constitutional: Negative for fever.       Some decreased appetite.    HENT:  Positive for congestion, postnasal drip and sinus pressure.   Eyes: Negative for pain and visual disturbance.  Respiratory: Positive for cough. Negative for chest tightness and shortness of breath.   Cardiovascular: Negative for chest pain, palpitations and leg swelling.  Gastrointestinal: Positive for nausea. Negative for vomiting, abdominal pain and diarrhea.  Genitourinary: Negative for dysuria and difficulty urinating.  Skin: Negative for color change and rash.  Neurological: Positive for headaches. Negative for dizziness and light-headedness.  Psychiatric/Behavioral: Negative for dysphoric mood and agitation.       Objective:    Physical Exam  Constitutional: No distress.  HENT:  Mouth/Throat: Oropharynx is clear and moist.  Erythematous turbinates.    Eyes:  Conjunctivae are normal. Right eye exhibits no discharge. Left eye exhibits no discharge.  Neck: Neck supple.  Cardiovascular: Normal rate and regular rhythm.   Pulmonary/Chest: Breath sounds normal. No respiratory distress. She has no wheezes.  Abdominal: Soft. Bowel sounds are normal. There is no tenderness.  Musculoskeletal: She exhibits no edema or tenderness.  Lymphadenopathy:    She has no cervical adenopathy.  Skin: No rash noted. No erythema.  Psychiatric: She has a normal mood and affect. Her behavior is normal.    BP 140/80 mmHg  Pulse 90  Temp(Src) 97.9 F (36.6 C) (Oral)  Resp 17  Ht 4\' 11"  (1.499 m)  Wt 99 lb (44.906 kg)  BMI 19.98 kg/m2  SpO2 95% Wt Readings from Last 3 Encounters:  06/19/15 99 lb (44.906 kg)  06/16/15 99 lb (44.906 kg)  06/15/15 102 lb 3.2 oz (46.358 kg)     Lab Results  Component Value Date   WBC 12.3* 06/16/2015   HGB 14.5 06/16/2015   HCT 43.6 06/16/2015   PLT 272.0 06/16/2015   GLUCOSE 99 06/16/2015   CHOL 220* 06/16/2015   TRIG 64.0 06/16/2015   HDL 82.20 06/16/2015   LDLCALC 125* 06/16/2015   ALT 15 06/16/2015   AST 20 06/16/2015   NA 138 06/16/2015   K 4.2 06/16/2015   CL 99 06/16/2015   CREATININE 0.73 06/16/2015   BUN 13 06/16/2015   CO2 30 06/16/2015   TSH 1.13 06/16/2015       Assessment & Plan:   Problem List Items Addressed This Visit    Headache - Primary    Increased pressure - sinuses and top of her head.  Started over the last couple of days.  I believe the headache is coming from the infection (sinus).  Discussed at length with her.  No focal findings on exam.  No dizziness.  No vision change.  Will obtain CT head given the increased pressure.  Treat sinus infection as outlined.  Follow closely.  Any worsening change in symptoms or problems, she is to be evaluated immediately.  Will see her back in the next few days.        Relevant Orders   CT Head Wo Contrast (Completed)     I spent 25 minutes with the  patient and more than 50% of the time was spent in consultation regarding the above.     Einar Pheasant, MD

## 2015-06-16 NOTE — Patient Instructions (Signed)
Saline nasal spray - flush nose at least 2-3x/day  nasacort nasal spray - 2 sprays each nostril one time per day.  Do this in the evening.  

## 2015-06-17 ENCOUNTER — Encounter: Payer: Medicare Other | Admitting: Physical Therapy

## 2015-06-17 ENCOUNTER — Telehealth: Payer: Self-pay | Admitting: *Deleted

## 2015-06-17 NOTE — Telephone Encounter (Signed)
Pt called this morning to let you know that she is still coughing but feeling better today than yesterday.

## 2015-06-17 NOTE — Telephone Encounter (Signed)
Lm for pt to call the office back to schedule appt for 11/18 @10 :30.Marland Kitchen

## 2015-06-17 NOTE — Telephone Encounter (Signed)
Stow.  Thanks for update.  Keep [place] posted.  Schedule f/u appt with me on Friday 10:30 (56min) if still open.

## 2015-06-18 ENCOUNTER — Ambulatory Visit: Payer: Medicare Other | Admitting: Physical Therapy

## 2015-06-18 ENCOUNTER — Encounter: Payer: Medicare Other | Admitting: Physical Therapy

## 2015-06-19 ENCOUNTER — Encounter: Payer: Self-pay | Admitting: Internal Medicine

## 2015-06-19 ENCOUNTER — Encounter: Payer: Medicare Other | Admitting: Physical Therapy

## 2015-06-19 ENCOUNTER — Ambulatory Visit (INDEPENDENT_AMBULATORY_CARE_PROVIDER_SITE_OTHER): Payer: Medicare Other | Admitting: Internal Medicine

## 2015-06-19 VITALS — BP 110/70 | HR 80 | Temp 97.0°F | Resp 18 | Ht 59.0 in | Wt 99.0 lb

## 2015-06-19 DIAGNOSIS — K2100 Gastro-esophageal reflux disease with esophagitis, without bleeding: Secondary | ICD-10-CM

## 2015-06-19 DIAGNOSIS — R51 Headache: Secondary | ICD-10-CM

## 2015-06-19 DIAGNOSIS — J069 Acute upper respiratory infection, unspecified: Secondary | ICD-10-CM | POA: Diagnosis not present

## 2015-06-19 DIAGNOSIS — R519 Headache, unspecified: Secondary | ICD-10-CM

## 2015-06-19 NOTE — Telephone Encounter (Signed)
Pt was seen today.

## 2015-06-19 NOTE — Progress Notes (Signed)
Patient ID: Christy Cisneros, female   DOB: 23-Jun-1920, 81 y.o.   MRN: [id number]   Subjective:    Patient ID: Christy Cisneros, female    DOB: [birth year], 81 y.o.   MRN: [id number]  HPI  Patient here for a scheduled follow up.  Was just seen a few days ago.  Seen acutely for headache and congestion.  See last note for details.  CT revealed sinus disease.  CXR negative for acute abnormality.  Treated with amoxicillin.  Head is better.  She overall feels better, but is still weak.  Is eating better.  She is accompanied by her neighbor Christy Cisneros [phone] or [phone].  History obtained from both of them.  No nausea now.  No vomiting.  No bowel change.  Answering questions.  No chest pain or tightness.    Past Medical History  Diagnosis Date  . Chronic bronchitis ([place])   . Hypercholesteremia   . GERD (gastroesophageal reflux disease)   . Barrett's esophagus with esophagitis   . Sleep apnea     has CPAP  . Glaucoma   . TIA (transient ischemic attack)   . Osteoporosis   . Osteoarthritis    Past Surgical History  Procedure Laterality Date  . Bunionectomy      left  . Rotator cuff repair      right   Family History  Problem Relation Age of Onset  . Breast cancer Sister   . Heart disease Mother    Social History   Social History  . Marital Status: Single    Spouse Name: N/A  . Number of Children: N/A  . Years of Education: N/A   Social History Main Topics  . Smoking status: Never Smoker   . Smokeless tobacco: Never Used  . Alcohol Use: 0.0 oz/week    0 Standard drinks or equivalent per week     Comment: wine occasional  . Drug Use: No  . Sexual Activity: Not Asked   Other Topics Concern  . None   Social History Narrative    Outpatient Encounter Prescriptions as of 06/19/2015  Medication Sig  . Aloe LIQD Take by mouth daily.   Marland Kitchen amoxicillin (AMOXIL) 500 MG capsule Take 1 capsule (500 mg total) by mouth 3 (three) times daily.  Marland Kitchen aspirin 81 MG tablet  Take 81 mg by mouth daily.  . Bilberry, Vaccinium myrtillus, (BILBERRY PO) Take 1 capsule by mouth daily.  . calcium carbonate (OS-CAL) 600 MG TABS Take 600 mg by mouth daily.  . dorzolamide-timolol (COSOPT) 22.3-6.8 MG/ML ophthalmic solution Apply to eye.  Marland Kitchen erythromycin ophthalmic ointment   . Flaxseed, Linseed, (GROUND FLAX SEEDS PO) Take by mouth daily.   Marland Kitchen latanoprost (XALATAN) 0.005 % ophthalmic solution   . Multiple Vitamins-Minerals (PRESERVISION AREDS 2 PO) Take by mouth 2 (two) times daily.  Marland Kitchen omeprazole (PRILOSEC) 20 MG capsule   . prednisoLONE acetate (PRED FORTE) 1 % ophthalmic suspension Place 1 drop into the left eye 2 (two) times daily.  . timolol (BETIMOL) 0.5 % ophthalmic solution Place 1 drop into the right eye daily.   No facility-administered encounter medications on file as of 06/19/2015.    Review of Systems  Constitutional: Positive for fatigue. Negative for fever.       Eating better.   HENT: Positive for congestion and sinus pressure. Negative for sore throat.   Eyes: Negative for pain and discharge.  Respiratory: Negative for cough, chest tightness and shortness of breath.   Cardiovascular: Negative  for chest pain, palpitations and leg swelling.  Gastrointestinal: Negative for nausea, vomiting, abdominal pain and diarrhea.  Genitourinary: Negative for dysuria and difficulty urinating.  Musculoskeletal: Negative for back pain and joint swelling.  Skin: Negative for color change and rash.  Neurological: Negative for dizziness, light-headedness and headaches (headache better. ).  Psychiatric/Behavioral: Negative for dysphoric mood and agitation.       Objective:    Physical Exam  Constitutional: She appears well-developed and well-nourished. No distress.  HENT:  Nose: Nose normal.  Mouth/Throat: Oropharynx is clear and moist.  Eyes: Conjunctivae are normal. Right eye exhibits no discharge. Left eye exhibits no discharge.  Neck: Neck supple.    Cardiovascular: Normal rate and regular rhythm.   Pulmonary/Chest: Breath sounds normal. No respiratory distress. She has no wheezes.  Abdominal: Soft. Bowel sounds are normal. There is no tenderness.  Musculoskeletal: She exhibits no edema or tenderness.  Lymphadenopathy:    She has no cervical adenopathy.  Skin: No erythema.  Psychiatric: She has a normal mood and affect. Her behavior is normal.    BP 110/70 mmHg  Pulse 80  Temp(Src) 97 F (36.1 C) (Oral)  Resp 18  Ht 4\' 11"  (1.499 m)  Wt 99 lb (44.906 kg)  BMI 19.98 kg/m2  SpO2 96% Wt Readings from Last 3 Encounters:  06/19/15 99 lb (44.906 kg)  06/16/15 99 lb (44.906 kg)  06/15/15 102 lb 3.2 oz (46.358 kg)     Lab Results  Component Value Date   WBC 12.3* 06/16/2015   HGB 14.5 06/16/2015   HCT 43.6 06/16/2015   PLT 272.0 06/16/2015   GLUCOSE 99 06/16/2015   CHOL 220* 06/16/2015   TRIG 64.0 06/16/2015   HDL 82.20 06/16/2015   LDLCALC 125* 06/16/2015   ALT 15 06/16/2015   AST 20 06/16/2015   NA 138 06/16/2015   K 4.2 06/16/2015   CL 99 06/16/2015   CREATININE 0.73 06/16/2015   BUN 13 06/16/2015   CO2 30 06/16/2015   TSH 1.13 06/16/2015    Dg Chest 2 View  06/16/2015  CLINICAL DATA:  Nonproductive cough. EXAM: CHEST  2 VIEW COMPARISON:  August 20, 2012. FINDINGS: The heart size and mediastinal contours are within normal limits. Both lungs are clear. No pneumothorax or pleural effusion is noted. Hyperexpansion of the lungs is noted consistent with chronic obstructive pulmonary disease. Status post left shoulder arthroplasty. IMPRESSION: Findings consistent with chronic obstructive pulmonary disease. No acute cardiopulmonary abnormality seen. Electronically Signed   By: Marijo Conception, M.D.   On: 06/16/2015 16:48   Ct Head Wo Contrast  06/16/2015  CLINICAL DATA:  Severe frontal headache, blurred vision for several days EXAM: CT HEAD WITHOUT CONTRAST TECHNIQUE: Contiguous axial images were obtained from the  base of the skull through the vertex without intravenous contrast. COMPARISON:  CT brain scan of 12/06/2012 FINDINGS: The ventricular system is stable being slightly prominent, and there is considerable diffuse prominence of the cortical sulci and cerebellar folia consistent with diffuse atrophy. Moderate small vessel ischemic change throughout the periventricular white matter is unchanged. No hemorrhage, mass lesion, or acute infarction is seen. On bone window images, there is extensive ethmoid sinus disease. There may also be and air-fluid level in the left maxillary sinus, but the maxillary sinuses are not well seen. The sphenoid and frontal sinuses appear pneumatized. No calvarial abnormality is seen. IMPRESSION: 1. Stable atrophy and moderate small vessel ischemic change throughout the periventricular white matter. No acute intracranial abnormality. 2. Ethmoid sinusitis  and possible left maxillary sinus disease as well. Electronically Signed   By: Ivar Drape M.D.   On: 06/16/2015 13:54       Assessment & Plan:   Problem List Items Addressed This Visit    GERD (gastroesophageal reflux disease) - Primary    Controlled.  On protonix.       Headache    CT as outlined.  Sinus disease.  Headache resolved after starting abx.        URI (upper respiratory infection)    On amoxicillin.  Doing better.  Feels better.  No fever.  Appetite better.  Follow.         I spent 25 minutes with the patient and more than 50% of the time was spent in consultation regarding the above.     Einar Pheasant, MD

## 2015-06-19 NOTE — Progress Notes (Signed)
Pre-visit discussion using our clinic review tool. No additional management support is needed unless otherwise documented below in the visit note.  

## 2015-06-21 ENCOUNTER — Encounter: Payer: Self-pay | Admitting: Internal Medicine

## 2015-06-21 NOTE — Assessment & Plan Note (Signed)
Controlled.  On protonix.   

## 2015-06-21 NOTE — Assessment & Plan Note (Addendum)
CT as outlined.  Sinus disease.  Headache resolved after starting abx.  Discussed at length with her today, the need to eat and take her medications regularly.  Follow closely.  Get her back in soon to reassess.

## 2015-06-21 NOTE — Assessment & Plan Note (Signed)
On amoxicillin.  Doing better.  Feels better.  No fever.  Appetite better.  Follow.

## 2015-06-22 ENCOUNTER — Encounter: Payer: Medicare Other | Admitting: Physical Therapy

## 2015-06-23 ENCOUNTER — Encounter: Payer: Medicare Other | Admitting: Physical Therapy

## 2015-06-24 ENCOUNTER — Encounter: Payer: Medicare Other | Admitting: Physical Therapy

## 2015-06-29 ENCOUNTER — Encounter: Payer: Medicare Other | Admitting: Physical Therapy

## 2015-06-29 ENCOUNTER — Ambulatory Visit: Payer: Medicare Other | Admitting: Physical Therapy

## 2015-07-01 DIAGNOSIS — H903 Sensorineural hearing loss, bilateral: Secondary | ICD-10-CM | POA: Diagnosis not present

## 2015-07-01 DIAGNOSIS — H6123 Impacted cerumen, bilateral: Secondary | ICD-10-CM | POA: Diagnosis not present

## 2015-07-02 ENCOUNTER — Ambulatory Visit: Payer: Medicare Other | Admitting: Physical Therapy

## 2015-07-02 ENCOUNTER — Encounter: Payer: Medicare Other | Admitting: Physical Therapy

## 2015-07-03 ENCOUNTER — Encounter: Payer: Medicare Other | Admitting: Physical Therapy

## 2015-07-07 ENCOUNTER — Encounter: Payer: Self-pay | Admitting: Internal Medicine

## 2015-07-07 ENCOUNTER — Ambulatory Visit (INDEPENDENT_AMBULATORY_CARE_PROVIDER_SITE_OTHER): Payer: Medicare Other | Admitting: Internal Medicine

## 2015-07-07 VITALS — BP 120/80 | HR 82 | Temp 97.6°F | Resp 18 | Ht 59.0 in | Wt 103.0 lb

## 2015-07-07 DIAGNOSIS — E7800 Pure hypercholesterolemia, unspecified: Secondary | ICD-10-CM | POA: Diagnosis not present

## 2015-07-07 DIAGNOSIS — K2100 Gastro-esophageal reflux disease with esophagitis, without bleeding: Secondary | ICD-10-CM

## 2015-07-07 DIAGNOSIS — R5383 Other fatigue: Secondary | ICD-10-CM | POA: Diagnosis not present

## 2015-07-07 NOTE — Progress Notes (Signed)
Patient ID: Christy Cisneros, female   DOB: Sep 26, 1919, 81 y.o.   MRN: [id number]   Subjective:    Patient ID: Christy Cisneros, female    DOB: 05-Mar-1920, 81 y.o.   MRN: [id number]  HPI  Patient with past history of hypercholesterolemia, GERD (Barrett's), sleep apnea and osteoporosis.  She comes in today for a scheduled follow up.  She is doing better.  Feeling better.  No headache.  No sinus congestion.  No chest congestion.  No cough.  No sob.  No abdominal pain or cramping.  Eating better.  No nausea or vomiting.  Bowels stable.  Still with decreased energy.  Is improving slowly.     Past Medical History  Diagnosis Date  . Chronic bronchitis ([place])   . Hypercholesteremia   . GERD (gastroesophageal reflux disease)   . Barrett's esophagus with esophagitis   . Sleep apnea     has CPAP  . Glaucoma   . TIA (transient ischemic attack)   . Osteoporosis   . Osteoarthritis    Past Surgical History  Procedure Laterality Date  . Bunionectomy      left  . Rotator cuff repair      right   Family History  Problem Relation Age of Onset  . Breast cancer Sister   . Heart disease Mother    Social History   Social History  . Marital Status: Single    Spouse Name: N/A  . Number of Children: N/A  . Years of Education: N/A   Social History Main Topics  . Smoking status: Never Smoker   . Smokeless tobacco: Never Used  . Alcohol Use: 0.0 oz/week    0 Standard drinks or equivalent per week     Comment: wine occasional  . Drug Use: No  . Sexual Activity: Not Asked   Other Topics Concern  . None   Social History Narrative    Outpatient Encounter Prescriptions as of 07/07/2015  Medication Sig  . Aloe LIQD Take by mouth daily.   Marland Kitchen aspirin 81 MG tablet Take 81 mg by mouth daily.  . Bilberry, Vaccinium myrtillus, (BILBERRY PO) Take 1 capsule by mouth daily.  . calcium carbonate (OS-CAL) 600 MG TABS Take 600 mg by mouth daily.  . dorzolamide-timolol (COSOPT) 22.3-6.8 MG/ML  ophthalmic solution Apply to eye.  Marland Kitchen erythromycin ophthalmic ointment   . Flaxseed, Linseed, (GROUND FLAX SEEDS PO) Take by mouth daily.   Marland Kitchen latanoprost (XALATAN) 0.005 % ophthalmic solution   . Multiple Vitamins-Minerals (PRESERVISION AREDS 2 PO) Take by mouth 2 (two) times daily.  Marland Kitchen omeprazole (PRILOSEC) 20 MG capsule   . prednisoLONE acetate (PRED FORTE) 1 % ophthalmic suspension Place 1 drop into the left eye 2 (two) times daily.  . timolol (BETIMOL) 0.5 % ophthalmic solution Place 1 drop into the right eye daily.  . [DISCONTINUED] amoxicillin (AMOXIL) 500 MG capsule Take 1 capsule (500 mg total) by mouth 3 (three) times daily.   No facility-administered encounter medications on file as of 07/07/2015.    Review of Systems  Constitutional: Positive for fatigue. Negative for appetite change and unexpected weight change.  HENT: Negative for congestion and sinus pressure.   Respiratory: Negative for cough, chest tightness and shortness of breath.   Cardiovascular: Negative for chest pain, palpitations and leg swelling.  Gastrointestinal: Negative for nausea, vomiting, abdominal pain and diarrhea.  Genitourinary: Negative for dysuria and difficulty urinating.  Musculoskeletal: Negative for back pain and joint swelling.  Skin: Negative for color change  and rash.  Neurological: Negative for dizziness, light-headedness and headaches.  Psychiatric/Behavioral: Negative for dysphoric mood and agitation.       Objective:    Physical Exam  Constitutional: She appears well-developed and well-nourished. No distress.  HENT:  Nose: Nose normal.  Mouth/Throat: Oropharynx is clear and moist.  Eyes: Conjunctivae are normal. Right eye exhibits no discharge. Left eye exhibits no discharge.  Neck: Neck supple. No thyromegaly present.  Cardiovascular: Normal rate and regular rhythm.   Pulmonary/Chest: Breath sounds normal. No respiratory distress. She has no wheezes.  Abdominal: Soft. Bowel sounds  are normal. There is no tenderness.  Musculoskeletal: She exhibits no edema or tenderness.  Lymphadenopathy:    She has no cervical adenopathy.  Skin: No rash noted. No erythema.  Psychiatric: She has a normal mood and affect. Her behavior is normal.    BP 120/80 mmHg  Pulse 82  Temp(Src) 97.6 F (36.4 C) (Oral)  Resp 18  Ht 4\' 11"  (1.499 m)  Wt 103 lb (46.72 kg)  BMI 20.79 kg/m2  SpO2 94% Wt Readings from Last 3 Encounters:  07/07/15 103 lb (46.72 kg)  06/19/15 99 lb (44.906 kg)  06/16/15 99 lb (44.906 kg)     Lab Results  Component Value Date   WBC 12.3* 06/16/2015   HGB 14.5 06/16/2015   HCT 43.6 06/16/2015   PLT 272.0 06/16/2015   GLUCOSE 99 06/16/2015   CHOL 220* 06/16/2015   TRIG 64.0 06/16/2015   HDL 82.20 06/16/2015   LDLCALC 125* 06/16/2015   ALT 15 06/16/2015   AST 20 06/16/2015   NA 138 06/16/2015   K 4.2 06/16/2015   CL 99 06/16/2015   CREATININE 0.73 06/16/2015   BUN 13 06/16/2015   CO2 30 06/16/2015   TSH 1.13 06/16/2015    Dg Chest 2 View  06/16/2015  CLINICAL DATA:  Nonproductive cough. EXAM: CHEST  2 VIEW COMPARISON:  August 20, 2012. FINDINGS: The heart size and mediastinal contours are within normal limits. Both lungs are clear. No pneumothorax or pleural effusion is noted. Hyperexpansion of the lungs is noted consistent with chronic obstructive pulmonary disease. Status post left shoulder arthroplasty. IMPRESSION: Findings consistent with chronic obstructive pulmonary disease. No acute cardiopulmonary abnormality seen. Electronically Signed   By: Marijo Conception, M.D.   On: 06/16/2015 16:48   Ct Head Wo Contrast  06/16/2015  CLINICAL DATA:  Severe frontal headache, blurred vision for several days EXAM: CT HEAD WITHOUT CONTRAST TECHNIQUE: Contiguous axial images were obtained from the base of the skull through the vertex without intravenous contrast. COMPARISON:  CT brain scan of 12/06/2012 FINDINGS: The ventricular system is stable being  slightly prominent, and there is considerable diffuse prominence of the cortical sulci and cerebellar folia consistent with diffuse atrophy. Moderate small vessel ischemic change throughout the periventricular white matter is unchanged. No hemorrhage, mass lesion, or acute infarction is seen. On bone window images, there is extensive ethmoid sinus disease. There may also be and air-fluid level in the left maxillary sinus, but the maxillary sinuses are not well seen. The sphenoid and frontal sinuses appear pneumatized. No calvarial abnormality is seen. IMPRESSION: 1. Stable atrophy and moderate small vessel ischemic change throughout the periventricular white matter. No acute intracranial abnormality. 2. Ethmoid sinusitis and possible left maxillary sinus disease as well. Electronically Signed   By: Ivar Drape M.D.   On: 06/16/2015 13:54       Assessment & Plan:   Problem List Items Addressed This Visit  Fatigue    Energy is gradually improving.  Previous infection resolved.  Eating better.  Follow.        GERD (gastroesophageal reflux disease) - Primary    No upper symptoms reported.  Follow.        HYPERCHOLESTEROLEMIA    Low cholesterol diet and exercise.  Follow lipid panel.           Einar Pheasant, MD

## 2015-07-07 NOTE — Progress Notes (Signed)
Pre-visit discussion using our clinic review tool. No additional management support is needed unless otherwise documented below in the visit note.  

## 2015-07-09 DIAGNOSIS — H401113 Primary open-angle glaucoma, right eye, severe stage: Secondary | ICD-10-CM | POA: Diagnosis not present

## 2015-07-09 DIAGNOSIS — H401123 Primary open-angle glaucoma, left eye, severe stage: Secondary | ICD-10-CM | POA: Diagnosis not present

## 2015-07-09 DIAGNOSIS — H353122 Nonexudative age-related macular degeneration, left eye, intermediate dry stage: Secondary | ICD-10-CM | POA: Diagnosis not present

## 2015-07-09 DIAGNOSIS — H04123 Dry eye syndrome of bilateral lacrimal glands: Secondary | ICD-10-CM | POA: Diagnosis not present

## 2015-07-09 DIAGNOSIS — H353112 Nonexudative age-related macular degeneration, right eye, intermediate dry stage: Secondary | ICD-10-CM | POA: Diagnosis not present

## 2015-07-09 DIAGNOSIS — H1820 Unspecified corneal edema: Secondary | ICD-10-CM | POA: Diagnosis not present

## 2015-07-09 DIAGNOSIS — H47233 Glaucomatous optic atrophy, bilateral: Secondary | ICD-10-CM | POA: Diagnosis not present

## 2015-07-09 DIAGNOSIS — H35363 Drusen (degenerative) of macula, bilateral: Secondary | ICD-10-CM | POA: Diagnosis not present

## 2015-07-12 ENCOUNTER — Encounter: Payer: Self-pay | Admitting: Internal Medicine

## 2015-07-12 DIAGNOSIS — R531 Weakness: Secondary | ICD-10-CM | POA: Insufficient documentation

## 2015-07-12 NOTE — Assessment & Plan Note (Signed)
No upper symptoms reported.  Follow.   

## 2015-07-12 NOTE — Assessment & Plan Note (Signed)
Low cholesterol diet and exercise.  Follow lipid panel.   

## 2015-07-12 NOTE — Assessment & Plan Note (Signed)
Energy is gradually improving.  Previous infection resolved.  Eating better.  Follow.

## 2015-08-06 DIAGNOSIS — H903 Sensorineural hearing loss, bilateral: Secondary | ICD-10-CM | POA: Diagnosis not present

## 2015-08-06 DIAGNOSIS — H6123 Impacted cerumen, bilateral: Secondary | ICD-10-CM | POA: Diagnosis not present

## 2015-08-14 ENCOUNTER — Ambulatory Visit (INDEPENDENT_AMBULATORY_CARE_PROVIDER_SITE_OTHER): Payer: Medicare Other | Admitting: Internal Medicine

## 2015-08-14 ENCOUNTER — Telehealth: Payer: Self-pay | Admitting: Internal Medicine

## 2015-08-14 ENCOUNTER — Encounter: Payer: Self-pay | Admitting: Internal Medicine

## 2015-08-14 VITALS — BP 120/70 | HR 76 | Temp 97.7°F | Resp 18 | Ht 59.0 in | Wt 104.0 lb

## 2015-08-14 DIAGNOSIS — T148 Other injury of unspecified body region: Secondary | ICD-10-CM

## 2015-08-14 DIAGNOSIS — G4733 Obstructive sleep apnea (adult) (pediatric): Secondary | ICD-10-CM | POA: Diagnosis not present

## 2015-08-14 DIAGNOSIS — R32 Unspecified urinary incontinence: Secondary | ICD-10-CM | POA: Diagnosis not present

## 2015-08-14 DIAGNOSIS — K2270 Barrett's esophagus without dysplasia: Secondary | ICD-10-CM | POA: Diagnosis not present

## 2015-08-14 DIAGNOSIS — H409 Unspecified glaucoma: Secondary | ICD-10-CM

## 2015-08-14 DIAGNOSIS — K2100 Gastro-esophageal reflux disease with esophagitis, without bleeding: Secondary | ICD-10-CM

## 2015-08-14 DIAGNOSIS — R2681 Unsteadiness on feet: Secondary | ICD-10-CM

## 2015-08-14 DIAGNOSIS — M810 Age-related osteoporosis without current pathological fracture: Secondary | ICD-10-CM

## 2015-08-14 DIAGNOSIS — T148XXA Other injury of unspecified body region, initial encounter: Secondary | ICD-10-CM

## 2015-08-14 DIAGNOSIS — E7800 Pure hypercholesterolemia, unspecified: Secondary | ICD-10-CM

## 2015-08-14 LAB — URINALYSIS, ROUTINE W REFLEX MICROSCOPIC
BILIRUBIN URINE: NEGATIVE
Hgb urine dipstick: NEGATIVE
KETONES UR: NEGATIVE
LEUKOCYTES UA: NEGATIVE
Nitrite: NEGATIVE
PH: 6 (ref 5.0–8.0)
RBC / HPF: NONE SEEN (ref 0–?)
Specific Gravity, Urine: 1.02 (ref 1.000–1.030)
TOTAL PROTEIN, URINE-UPE24: NEGATIVE
URINE GLUCOSE: NEGATIVE
UROBILINOGEN UA: 0.2 (ref 0.0–1.0)
WBC, UA: NONE SEEN (ref 0–?)

## 2015-08-14 NOTE — Progress Notes (Signed)
Pre-visit discussion using our clinic review tool. No additional management support is needed unless otherwise documented below in the visit note.  

## 2015-08-14 NOTE — Progress Notes (Signed)
Patient ID: Christy Cisneros, female   DOB: 1920/04/03, 82 y.o.   MRN: [id number]   Subjective:    Patient ID: Christy Cisneros, female    DOB: [birth year], 82 y.o.   MRN: [id number]  HPI  Patient with past history of chronic bronchitis, GERD, Barrett's, hypercholesterolemia and sleep apnea.  She comes in today to follow up on these issues.  She reports doing better.  Feels better.  Staying active.  Weight has improved.  Appetite has improved.  No headache.  No chest pain or tightness.  No sob.  No abdominal pain or cramping. No increased acid reflux.  Bowels stable.  Does report some occasional urine incontinence - noticed recently.  Wants her urine checked to confirm no infection.     Past Medical History  Diagnosis Date  . Chronic bronchitis ([place])   . Hypercholesteremia   . GERD (gastroesophageal reflux disease)   . Barrett's esophagus with esophagitis   . Sleep apnea     has CPAP  . Glaucoma   . TIA (transient ischemic attack)   . Osteoporosis   . Osteoarthritis    Past Surgical History  Procedure Laterality Date  . Bunionectomy      left  . Rotator cuff repair      right   Family History  Problem Relation Age of Onset  . Breast cancer Sister   . Heart disease Mother    Social History   Social History  . Marital Status: Single    Spouse Name: N/A  . Number of Children: N/A  . Years of Education: N/A   Social History Main Topics  . Smoking status: Never Smoker   . Smokeless tobacco: Never Used  . Alcohol Use: 0.0 oz/week    0 Standard drinks or equivalent per week     Comment: wine occasional  . Drug Use: No  . Sexual Activity: Not Asked   Other Topics Concern  . None   Social History Narrative    Outpatient Encounter Prescriptions as of 08/14/2015  Medication Sig  . Aloe LIQD Take by mouth daily.   Marland Kitchen aspirin 81 MG tablet Take 81 mg by mouth daily.  . Bilberry, Vaccinium myrtillus, (BILBERRY PO) Take 1 capsule by mouth daily.  . calcium carbonate  (OS-CAL) 600 MG TABS Take 600 mg by mouth daily.  . dorzolamide-timolol (COSOPT) 22.3-6.8 MG/ML ophthalmic solution Apply to eye.  Marland Kitchen erythromycin ophthalmic ointment   . Flaxseed, Linseed, (GROUND FLAX SEEDS PO) Take by mouth daily.   Marland Kitchen latanoprost (XALATAN) 0.005 % ophthalmic solution   . Multiple Vitamins-Minerals (PRESERVISION AREDS 2 PO) Take by mouth 2 (two) times daily.  Marland Kitchen omeprazole (PRILOSEC) 20 MG capsule   . prednisoLONE acetate (PRED FORTE) 1 % ophthalmic suspension Place 1 drop into the left eye 2 (two) times daily.  . timolol (BETIMOL) 0.5 % ophthalmic solution Place 1 drop into the right eye daily.   No facility-administered encounter medications on file as of 08/14/2015.    Review of Systems  Constitutional: Negative for fever and chills.       Appetite has improved.  Weight is up.   HENT: Negative for congestion and sinus pressure.   Eyes: Negative for discharge and redness.  Respiratory: Negative for cough, chest tightness and shortness of breath.   Cardiovascular: Negative for chest pain, palpitations and leg swelling.  Gastrointestinal: Negative for nausea, vomiting, abdominal pain and diarrhea.  Genitourinary: Negative for difficulty urinating.       Some occasional  urinary incontinence.   Musculoskeletal: Negative for back pain and joint swelling.  Skin: Negative for color change and rash.       Hematoma - right lower leg.  No increased erythema or evidence of infection.  No significant pain.    Neurological: Negative for dizziness, light-headedness and headaches.  Psychiatric/Behavioral: Negative for dysphoric mood and agitation.       Objective:    Physical Exam  Constitutional: No distress.  HENT:  Nose: Nose normal.  Mouth/Throat: Oropharynx is clear and moist.  Eyes: Conjunctivae are normal. Right eye exhibits no discharge. Left eye exhibits no discharge.  Neck: Neck supple. No thyromegaly present.  Cardiovascular: Normal rate and regular rhythm.     Pulmonary/Chest: Breath sounds normal. No respiratory distress. She has no wheezes.  Abdominal: Soft. Bowel sounds are normal. There is no tenderness.  Musculoskeletal: She exhibits no edema or tenderness.  Lymphadenopathy:    She has no cervical adenopathy.  Skin: No rash noted. No erythema.  Hematoma as outlined.   Psychiatric: She has a normal mood and affect. Her behavior is normal.    BP 120/70 mmHg  Pulse 76  Temp(Src) 97.7 F (36.5 C) (Oral)  Resp 18  Ht 4\' 11"  (1.499 m)  Wt 104 lb (47.174 kg)  BMI 20.99 kg/m2  SpO2 95% Wt Readings from Last 3 Encounters:  08/14/15 104 lb (47.174 kg)  07/07/15 103 lb (46.72 kg)  06/19/15 99 lb (44.906 kg)     Lab Results  Component Value Date   WBC 12.3* 06/16/2015   HGB 14.5 06/16/2015   HCT 43.6 06/16/2015   PLT 272.0 06/16/2015   GLUCOSE 99 06/16/2015   CHOL 220* 06/16/2015   TRIG 64.0 06/16/2015   HDL 82.20 06/16/2015   LDLCALC 125* 06/16/2015   ALT 15 06/16/2015   AST 20 06/16/2015   NA 138 06/16/2015   K 4.2 06/16/2015   CL 99 06/16/2015   CREATININE 0.73 06/16/2015   BUN 13 06/16/2015   CO2 30 06/16/2015   TSH 1.13 06/16/2015       Assessment & Plan:   Problem List Items Addressed This Visit    BARRETTS ESOPHAGUS    No upper symptoms reported.  Declines further testing/EGD.  On omeprazole.        GERD (gastroesophageal reflux disease)    No upper symptoms reported.  Taking omeprazole.        Glaucoma    Followed by opthalmology.       Hematoma    Right lower leg hematoma.  No evidence of infection.  Follow.        HYPERCHOLESTEROLEMIA    Low cholesterol diet and exercise.  Follow lipid panel.        Obstructive sleep apnea    CPAP.       Osteoporosis    Previously received reclast.  Continue weight bearing exercise.        Unsteady gait    Had been set up with physical therapy.  Had to stop when she had recent infection.  Discussed restarting.  She wants to hold until spring.         Urine incontinence - Primary    Not a significant issue, but has noticed a change.  Wants urine checked to confirm no infection.  Check urinalysis and urine culture.       Relevant Orders   Urinalysis, Routine w reflex microscopic (not at [facility]) (Completed)   CULTURE, URINE COMPREHENSIVE  Einar Pheasant, MD

## 2015-08-14 NOTE — Telephone Encounter (Signed)
The patient called stating she forgot to ask Dr. Nicki Reaper for something to help give her and appetite and help give her nourishment.

## 2015-08-14 NOTE — Telephone Encounter (Signed)
She actually gained weight on this visit.  I would hold on any prescription medication at this time. She told me since feeling better, she has been eating better.  We will follow weight.

## 2015-08-16 ENCOUNTER — Encounter: Payer: Self-pay | Admitting: Internal Medicine

## 2015-08-16 DIAGNOSIS — R32 Unspecified urinary incontinence: Secondary | ICD-10-CM | POA: Insufficient documentation

## 2015-08-16 DIAGNOSIS — T148XXA Other injury of unspecified body region, initial encounter: Secondary | ICD-10-CM | POA: Insufficient documentation

## 2015-08-16 NOTE — Assessment & Plan Note (Signed)
No upper symptoms reported.  Taking omeprazole.   

## 2015-08-16 NOTE — Assessment & Plan Note (Signed)
Not a significant issue, but has noticed a change.  Wants urine checked to confirm no infection.  Check urinalysis and urine culture.

## 2015-08-16 NOTE — Assessment & Plan Note (Signed)
Followed by opthalmology.       

## 2015-08-16 NOTE — Assessment & Plan Note (Signed)
Previously received reclast.  Continue weight bearing exercise.

## 2015-08-16 NOTE — Assessment & Plan Note (Signed)
CPAP.  

## 2015-08-16 NOTE — Assessment & Plan Note (Signed)
Right lower leg hematoma.  No evidence of infection.  Follow.

## 2015-08-16 NOTE — Assessment & Plan Note (Signed)
Had been set up with physical therapy.  Had to stop when she had recent infection.  Discussed restarting.  She wants to hold until spring.

## 2015-08-16 NOTE — Assessment & Plan Note (Signed)
No upper symptoms reported.  Declines further testing/EGD.  On omeprazole.

## 2015-08-16 NOTE — Assessment & Plan Note (Signed)
Low cholesterol diet and exercise.  Follow lipid panel.   

## 2015-08-17 LAB — CULTURE, URINE COMPREHENSIVE

## 2015-08-17 NOTE — Telephone Encounter (Signed)
Pt.notified

## 2015-08-19 MED ORDER — AMOXICILLIN 500 MG PO CAPS: 500.0000 mg | ORAL_CAPSULE | Freq: Three times a day (TID) | ORAL | Status: DC

## 2015-08-31 DIAGNOSIS — H903 Sensorineural hearing loss, bilateral: Secondary | ICD-10-CM | POA: Diagnosis not present

## 2015-08-31 DIAGNOSIS — H6123 Impacted cerumen, bilateral: Secondary | ICD-10-CM | POA: Diagnosis not present

## 2015-09-07 DIAGNOSIS — M79675 Pain in left toe(s): Secondary | ICD-10-CM | POA: Diagnosis not present

## 2015-09-07 DIAGNOSIS — B351 Tinea unguium: Secondary | ICD-10-CM | POA: Diagnosis not present

## 2015-09-07 DIAGNOSIS — M79674 Pain in right toe(s): Secondary | ICD-10-CM | POA: Diagnosis not present

## 2015-09-17 DIAGNOSIS — H353112 Nonexudative age-related macular degeneration, right eye, intermediate dry stage: Secondary | ICD-10-CM | POA: Diagnosis not present

## 2015-09-17 DIAGNOSIS — H401113 Primary open-angle glaucoma, right eye, severe stage: Secondary | ICD-10-CM | POA: Diagnosis not present

## 2015-09-17 DIAGNOSIS — H1820 Unspecified corneal edema: Secondary | ICD-10-CM | POA: Diagnosis not present

## 2015-09-17 DIAGNOSIS — H47233 Glaucomatous optic atrophy, bilateral: Secondary | ICD-10-CM | POA: Diagnosis not present

## 2015-09-17 DIAGNOSIS — H35363 Drusen (degenerative) of macula, bilateral: Secondary | ICD-10-CM | POA: Diagnosis not present

## 2015-09-17 DIAGNOSIS — H353122 Nonexudative age-related macular degeneration, left eye, intermediate dry stage: Secondary | ICD-10-CM | POA: Diagnosis not present

## 2015-09-17 DIAGNOSIS — H401123 Primary open-angle glaucoma, left eye, severe stage: Secondary | ICD-10-CM | POA: Diagnosis not present

## 2015-09-17 DIAGNOSIS — H04123 Dry eye syndrome of bilateral lacrimal glands: Secondary | ICD-10-CM | POA: Diagnosis not present

## 2015-09-29 ENCOUNTER — Encounter: Payer: Self-pay | Admitting: Internal Medicine

## 2015-09-29 ENCOUNTER — Ambulatory Visit (INDEPENDENT_AMBULATORY_CARE_PROVIDER_SITE_OTHER): Payer: Medicare Other | Admitting: Internal Medicine

## 2015-09-29 VITALS — BP 130/72 | HR 75 | Temp 97.7°F | Ht 59.0 in | Wt 108.2 lb

## 2015-09-29 DIAGNOSIS — E7800 Pure hypercholesterolemia, unspecified: Secondary | ICD-10-CM

## 2015-09-29 DIAGNOSIS — K2100 Gastro-esophageal reflux disease with esophagitis, without bleeding: Secondary | ICD-10-CM

## 2015-09-29 DIAGNOSIS — R2681 Unsteadiness on feet: Secondary | ICD-10-CM

## 2015-09-29 DIAGNOSIS — K2270 Barrett's esophagus without dysplasia: Secondary | ICD-10-CM

## 2015-09-29 NOTE — Progress Notes (Signed)
Patient ID: Christy Cisneros, female   DOB: 05-Dec-1919, 82 y.o.   MRN: [id number]   Subjective:    Patient ID: Christy Cisneros, female    DOB: [birth year], 82 y.o.   MRN: [id number]  HPI  Patient with past history of Barrett's, GERD, previous TIA and hypercholesterolemia.  She comes in today for a scheduled follow up.  She is doing better.  Back to walking.  Feels better.  No cough or congestion.  No sob.  Some acid reflux.  Taking her medication q day.  No abdominal pain or cramping.  Bowels stable.  Discussed increasing the dose of her medication.  She declines further w/up.  Has declined and continues to decline EGD.  No dysphagia.  No nausea or vomiting.     Past Medical History  Diagnosis Date  . Chronic bronchitis ([place])   . Hypercholesteremia   . GERD (gastroesophageal reflux disease)   . Barrett's esophagus with esophagitis   . Sleep apnea     has CPAP  . Glaucoma   . TIA (transient ischemic attack)   . Osteoporosis   . Osteoarthritis    Past Surgical History  Procedure Laterality Date  . Bunionectomy      left  . Rotator cuff repair      right   Family History  Problem Relation Age of Onset  . Breast cancer Sister   . Heart disease Mother    Social History   Social History  . Marital Status: Single    Spouse Name: N/A  . Number of Children: N/A  . Years of Education: N/A   Social History Main Topics  . Smoking status: Never Smoker   . Smokeless tobacco: Never Used  . Alcohol Use: 0.0 oz/week    0 Standard drinks or equivalent per week     Comment: wine occasional  . Drug Use: No  . Sexual Activity: Not Asked   Other Topics Concern  . None   Social History Narrative    Outpatient Encounter Prescriptions as of 09/29/2015  Medication Sig  . Aloe LIQD Take by mouth daily.   Marland Kitchen amoxicillin (AMOXIL) 500 MG capsule Take 1 capsule (500 mg total) by mouth 3 (three) times daily.  Marland Kitchen aspirin 81 MG tablet Take 81 mg by mouth daily.  . Bilberry, Vaccinium  myrtillus, (BILBERRY PO) Take 1 capsule by mouth daily.  . calcium carbonate (OS-CAL) 600 MG TABS Take 600 mg by mouth daily.  . dorzolamide-timolol (COSOPT) 22.3-6.8 MG/ML ophthalmic solution Apply to eye.  Marland Kitchen erythromycin ophthalmic ointment   . Flaxseed, Linseed, (GROUND FLAX SEEDS PO) Take by mouth daily.   Marland Kitchen latanoprost (XALATAN) 0.005 % ophthalmic solution   . Multiple Vitamins-Minerals (PRESERVISION AREDS 2 PO) Take by mouth 2 (two) times daily.  Marland Kitchen omeprazole (PRILOSEC) 20 MG capsule   . prednisoLONE acetate (PRED FORTE) 1 % ophthalmic suspension Place 1 drop into the left eye 2 (two) times daily.  . timolol (BETIMOL) 0.5 % ophthalmic solution Place 1 drop into the right eye daily.   No facility-administered encounter medications on file as of 09/29/2015.    Review of Systems  Constitutional: Negative for appetite change and unexpected weight change.       Weight increased.  Eating better.    HENT: Negative for congestion and sinus pressure.   Eyes: Negative for discharge and redness.  Respiratory: Negative for cough, chest tightness and shortness of breath.   Cardiovascular: Negative for chest pain, palpitations and leg swelling.  Gastrointestinal: Negative for nausea, vomiting, abdominal pain and diarrhea.       Acid reflux as outlined.    Genitourinary: Negative for dysuria and difficulty urinating.  Musculoskeletal: Negative for back pain and joint swelling.  Skin: Negative for color change and rash.  Neurological: Negative for dizziness, light-headedness and headaches.  Psychiatric/Behavioral: Negative for dysphoric mood and agitation.       Objective:    Physical Exam  Constitutional: She appears well-developed and well-nourished.  HENT:  Nose: Nose normal.  Mouth/Throat: Oropharynx is clear and moist.  Eyes: Conjunctivae are normal. Right eye exhibits no discharge. Left eye exhibits no discharge.  Neck: Neck supple. No thyromegaly present.  Cardiovascular: Normal  rate and regular rhythm.   Pulmonary/Chest: Breath sounds normal. No respiratory distress. She has no wheezes.  Abdominal: Soft. Bowel sounds are normal. There is no tenderness.  Musculoskeletal: She exhibits no edema or tenderness.  Lymphadenopathy:    She has no cervical adenopathy.  Skin: No rash noted. No erythema.  Psychiatric: She has a normal mood and affect. Her behavior is normal.    BP 130/72 mmHg  Pulse 75  Temp(Src) 97.7 F (36.5 C) (Oral)  Ht 4\' 11"  (1.499 m)  Wt 108 lb 3.2 oz (49.079 kg)  BMI 21.84 kg/m2  SpO2 99% Wt Readings from Last 3 Encounters:  09/29/15 108 lb 3.2 oz (49.079 kg)  08/14/15 104 lb (47.174 kg)  07/07/15 103 lb (46.72 kg)     Lab Results  Component Value Date   WBC 12.3* 06/16/2015   HGB 14.5 06/16/2015   HCT 43.6 06/16/2015   PLT 272.0 06/16/2015   GLUCOSE 99 06/16/2015   CHOL 220* 06/16/2015   TRIG 64.0 06/16/2015   HDL 82.20 06/16/2015   LDLCALC 125* 06/16/2015   ALT 15 06/16/2015   AST 20 06/16/2015   NA 138 06/16/2015   K 4.2 06/16/2015   CL 99 06/16/2015   CREATININE 0.73 06/16/2015   BUN 13 06/16/2015   CO2 30 06/16/2015   TSH 1.13 06/16/2015       Assessment & Plan:   Problem List Items Addressed This Visit    BARRETTS ESOPHAGUS    Reflux as outlined.  Increase omeprazole to bid.  Follow.  She declines further w/up and evaluation.  Declines EGD.       GERD (gastroesophageal reflux disease) - Primary    Acid reflux as outlined.  Increased omeprazole to bid.  She declines further w/up.  Declines EGD.        HYPERCHOLESTEROLEMIA    Follow lipid panel.       Unsteady gait    Has been to therapy.  She plans to do exercise at home.  Follow.            Einar Pheasant, MD

## 2015-09-29 NOTE — Progress Notes (Signed)
Pre visit review using our clinic review tool, if applicable. No additional management support is needed unless otherwise documented below in the visit note. 

## 2015-10-04 ENCOUNTER — Encounter: Payer: Self-pay | Admitting: Internal Medicine

## 2015-10-04 NOTE — Assessment & Plan Note (Signed)
Has been to therapy.  She plans to do exercise at home.  Follow.

## 2015-10-04 NOTE — Assessment & Plan Note (Signed)
Acid reflux as outlined.  Increased omeprazole to bid.  She declines further w/up.  Declines EGD.

## 2015-10-04 NOTE — Assessment & Plan Note (Signed)
Reflux as outlined.  Increase omeprazole to bid.  Follow.  She declines further w/up and evaluation.  Declines EGD.

## 2015-10-04 NOTE — Assessment & Plan Note (Signed)
Follow lipid panel.   

## 2015-10-15 DIAGNOSIS — H353122 Nonexudative age-related macular degeneration, left eye, intermediate dry stage: Secondary | ICD-10-CM | POA: Diagnosis not present

## 2015-10-15 DIAGNOSIS — H401123 Primary open-angle glaucoma, left eye, severe stage: Secondary | ICD-10-CM | POA: Diagnosis not present

## 2015-10-15 DIAGNOSIS — H353112 Nonexudative age-related macular degeneration, right eye, intermediate dry stage: Secondary | ICD-10-CM | POA: Diagnosis not present

## 2015-10-15 DIAGNOSIS — H04123 Dry eye syndrome of bilateral lacrimal glands: Secondary | ICD-10-CM | POA: Diagnosis not present

## 2015-10-15 DIAGNOSIS — H47233 Glaucomatous optic atrophy, bilateral: Secondary | ICD-10-CM | POA: Diagnosis not present

## 2015-10-15 DIAGNOSIS — H1820 Unspecified corneal edema: Secondary | ICD-10-CM | POA: Diagnosis not present

## 2015-10-15 DIAGNOSIS — H401113 Primary open-angle glaucoma, right eye, severe stage: Secondary | ICD-10-CM | POA: Diagnosis not present

## 2015-10-15 DIAGNOSIS — H5211 Myopia, right eye: Secondary | ICD-10-CM | POA: Diagnosis not present

## 2015-10-15 DIAGNOSIS — H35363 Drusen (degenerative) of macula, bilateral: Secondary | ICD-10-CM | POA: Diagnosis not present

## 2015-10-15 DIAGNOSIS — H52221 Regular astigmatism, right eye: Secondary | ICD-10-CM | POA: Diagnosis not present

## 2015-10-15 DIAGNOSIS — H524 Presbyopia: Secondary | ICD-10-CM | POA: Diagnosis not present

## 2015-10-19 DIAGNOSIS — H401113 Primary open-angle glaucoma, right eye, severe stage: Secondary | ICD-10-CM | POA: Diagnosis not present

## 2015-10-19 DIAGNOSIS — H52221 Regular astigmatism, right eye: Secondary | ICD-10-CM | POA: Diagnosis not present

## 2015-10-19 DIAGNOSIS — H401123 Primary open-angle glaucoma, left eye, severe stage: Secondary | ICD-10-CM | POA: Diagnosis not present

## 2015-10-19 DIAGNOSIS — H04123 Dry eye syndrome of bilateral lacrimal glands: Secondary | ICD-10-CM | POA: Diagnosis not present

## 2015-10-19 DIAGNOSIS — H353132 Nonexudative age-related macular degeneration, bilateral, intermediate dry stage: Secondary | ICD-10-CM | POA: Diagnosis not present

## 2015-10-19 DIAGNOSIS — H35361 Drusen (degenerative) of macula, right eye: Secondary | ICD-10-CM | POA: Diagnosis not present

## 2015-10-19 DIAGNOSIS — H1820 Unspecified corneal edema: Secondary | ICD-10-CM | POA: Diagnosis not present

## 2015-10-19 DIAGNOSIS — H47233 Glaucomatous optic atrophy, bilateral: Secondary | ICD-10-CM | POA: Diagnosis not present

## 2015-10-19 DIAGNOSIS — H35363 Drusen (degenerative) of macula, bilateral: Secondary | ICD-10-CM | POA: Diagnosis not present

## 2015-10-19 DIAGNOSIS — H5211 Myopia, right eye: Secondary | ICD-10-CM | POA: Diagnosis not present

## 2015-10-19 DIAGNOSIS — H524 Presbyopia: Secondary | ICD-10-CM | POA: Diagnosis not present

## 2015-11-02 DIAGNOSIS — H6123 Impacted cerumen, bilateral: Secondary | ICD-10-CM | POA: Diagnosis not present

## 2015-11-02 DIAGNOSIS — H903 Sensorineural hearing loss, bilateral: Secondary | ICD-10-CM | POA: Diagnosis not present

## 2015-11-12 ENCOUNTER — Ambulatory Visit (INDEPENDENT_AMBULATORY_CARE_PROVIDER_SITE_OTHER): Payer: Medicare Other | Admitting: Internal Medicine

## 2015-11-12 ENCOUNTER — Encounter: Payer: Self-pay | Admitting: Internal Medicine

## 2015-11-12 VITALS — BP 110/80 | HR 79 | Temp 97.8°F | Resp 18 | Ht 59.0 in | Wt 106.0 lb

## 2015-11-12 DIAGNOSIS — K2270 Barrett's esophagus without dysplasia: Secondary | ICD-10-CM

## 2015-11-12 DIAGNOSIS — K2100 Gastro-esophageal reflux disease with esophagitis, without bleeding: Secondary | ICD-10-CM

## 2015-11-12 DIAGNOSIS — R252 Cramp and spasm: Secondary | ICD-10-CM | POA: Diagnosis not present

## 2015-11-12 DIAGNOSIS — E7800 Pure hypercholesterolemia, unspecified: Secondary | ICD-10-CM

## 2015-11-12 DIAGNOSIS — G4733 Obstructive sleep apnea (adult) (pediatric): Secondary | ICD-10-CM

## 2015-11-12 LAB — COMPREHENSIVE METABOLIC PANEL
ALK PHOS: 55 U/L (ref 39–117)
ALT: 13 U/L (ref 0–35)
AST: 19 U/L (ref 0–37)
Albumin: 3.9 g/dL (ref 3.5–5.2)
BUN: 18 mg/dL (ref 6–23)
CO2: 30 meq/L (ref 19–32)
Calcium: 9.6 mg/dL (ref 8.4–10.5)
Chloride: 103 mEq/L (ref 96–112)
Creatinine, Ser: 0.78 mg/dL (ref 0.40–1.20)
GFR: 73.17 mL/min (ref >60.00)
GLUCOSE: 113 mg/dL — AB (ref 70–99)
POTASSIUM: 4.1 meq/L (ref 3.5–5.1)
Sodium: 139 mEq/L (ref 135–145)
TOTAL PROTEIN: 6.2 g/dL (ref 6.0–8.3)
Total Bilirubin: 0.5 mg/dL (ref 0.2–1.2)

## 2015-11-12 LAB — CBC WITH DIFFERENTIAL/PLATELET
Basophils Absolute: 0.1 10*3/uL (ref 0.0–0.1)
Basophils Relative: 1.1 % (ref 0.0–3.0)
EOS PCT: 2.8 % (ref 0.0–5.0)
Eosinophils Absolute: 0.2 10*3/uL (ref 0.0–0.7)
HEMATOCRIT: 40.1 % (ref 36.0–46.0)
HEMOGLOBIN: 13.4 g/dL (ref 12.0–15.0)
LYMPHS ABS: 1.2 10*3/uL (ref 0.7–4.0)
LYMPHS PCT: 17.6 % (ref 12.0–46.0)
MCHC: 33.5 g/dL (ref 30.0–36.0)
MCV: 90.3 fl (ref 78.0–100.0)
MONOS PCT: 8.2 % (ref 3.0–12.0)
Monocytes Absolute: 0.6 10*3/uL (ref 0.1–1.0)
NEUTROS PCT: 70.3 % (ref 43.0–77.0)
Neutro Abs: 4.9 10*3/uL (ref 1.4–7.7)
Platelets: 284 10*3/uL (ref 150.0–400.0)
RBC: 4.44 Mil/uL (ref 3.87–5.11)
RDW: 14 % (ref 11.5–15.5)
WBC: 7 10*3/uL (ref 4.0–10.5)

## 2015-11-12 NOTE — Progress Notes (Signed)
Pre-visit discussion using our clinic review tool. No additional management support is needed unless otherwise documented below in the visit note.  

## 2015-11-12 NOTE — Progress Notes (Signed)
Patient ID: Christy Cisneros, female   DOB: 08/17/1919, 82 y.o.   MRN: [id number]   Subjective:    Patient ID: Christy Cisneros, female    DOB: 29-Jul-1920, 82 y.o.   MRN: [id number]  HPI  Patient here for a scheduled follow up.  She is exercising.  Staying active.  No cardiac symptoms with increased activity or exertion.  Breathing stable.  No increased cough or congestion.  No acid reflux.  She states this is better.  No abdominal pain or cramping.  Bowels stable.  Discussed health care power of attorney - establishing.  She had questions regarding this matter.     Past Medical History  Diagnosis Date  . Chronic bronchitis ([place])   . Hypercholesteremia   . GERD (gastroesophageal reflux disease)   . Barrett's esophagus with esophagitis   . Sleep apnea     has CPAP  . Glaucoma   . TIA (transient ischemic attack)   . Osteoporosis   . Osteoarthritis    Past Surgical History  Procedure Laterality Date  . Bunionectomy      left  . Rotator cuff repair      right   Family History  Problem Relation Age of Onset  . Breast cancer Sister   . Heart disease Mother    Social History   Social History  . Marital Status: Single    Spouse Name: Christy Cisneros  . Number of Children: Christy Cisneros  . Years of Education: Christy Cisneros   Social History Main Topics  . Smoking status: Never Smoker   . Smokeless tobacco: Never Used  . Alcohol Use: 0.0 oz/week    0 Standard drinks or equivalent per week     Comment: wine occasional  . Drug Use: No  . Sexual Activity: Not Asked   Other Topics Concern  . None   Social History Narrative    Outpatient Encounter Prescriptions as of 11/12/2015  Medication Sig  . Aloe LIQD Take by mouth daily.   Marland Kitchen aspirin 81 MG tablet Take 81 mg by mouth daily.  . Bilberry, Vaccinium myrtillus, (BILBERRY PO) Take 1 capsule by mouth daily.  . calcium carbonate (OS-CAL) 600 MG TABS Take 600 mg by mouth daily.  . dorzolamide-timolol (COSOPT) 22.3-6.8 MG/ML ophthalmic solution Apply  to eye.  Marland Kitchen erythromycin ophthalmic ointment   . Flaxseed, Linseed, (GROUND FLAX SEEDS PO) Take by mouth daily.   Marland Kitchen latanoprost (XALATAN) 0.005 % ophthalmic solution   . Multiple Vitamins-Minerals (PRESERVISION AREDS 2 PO) Take by mouth 2 (two) times daily.  Marland Kitchen omeprazole (PRILOSEC) 20 MG capsule   . prednisoLONE acetate (PRED FORTE) 1 % ophthalmic suspension Place 1 drop into the left eye 2 (two) times daily.  . timolol (BETIMOL) 0.5 % ophthalmic solution Place 1 drop into the right eye daily.  . [DISCONTINUED] amoxicillin (AMOXIL) 500 MG capsule Take 1 capsule (500 mg total) by mouth 3 (three) times daily.   No facility-administered encounter medications on file as of 11/12/2015.    Review of Systems  Constitutional: Negative for appetite change and unexpected weight change.  HENT: Negative for congestion and sinus pressure.   Respiratory: Negative for cough, chest tightness and shortness of breath.   Cardiovascular: Negative for chest pain, palpitations and leg swelling.  Gastrointestinal: Negative for nausea, vomiting, abdominal pain and diarrhea.  Genitourinary: Negative for dysuria and difficulty urinating.  Musculoskeletal: Negative for back pain and joint swelling.  Skin: Negative for color change and rash.  Neurological: Negative for dizziness, light-headedness and  headaches.  Psychiatric/Behavioral: Negative for dysphoric mood and agitation.       Objective:    Physical Exam  Constitutional: She appears well-developed and well-nourished. No distress.  HENT:  Nose: Nose normal.  Mouth/Throat: Oropharynx is clear and moist.  Neck: Neck supple. No thyromegaly present.  Cardiovascular: Normal rate and regular rhythm.   Pulmonary/Chest: Breath sounds normal. No respiratory distress. She has no wheezes.  Abdominal: Soft. Bowel sounds are normal. There is no tenderness.  Musculoskeletal: She exhibits no edema or tenderness.  Lymphadenopathy:    She has no cervical adenopathy.    Skin: No rash noted. No erythema.  Psychiatric: She has a normal mood and affect. Her behavior is normal.    BP 110/80 mmHg  Pulse 79  Temp(Src) 97.8 F (36.6 C) (Oral)  Resp 18  Ht 4\' 11"  (1.499 m)  Wt 106 lb (48.081 kg)  BMI 21.40 kg/m2  SpO2 97% Wt Readings from Last 3 Encounters:  11/12/15 106 lb (48.081 kg)  09/29/15 108 lb 3.2 oz (49.079 kg)  08/14/15 104 lb (47.174 kg)     Lab Results  Component Value Date   WBC 7.0 11/12/2015   HGB 13.4 11/12/2015   HCT 40.1 11/12/2015   PLT 284.0 11/12/2015   GLUCOSE 113* 11/12/2015   CHOL 220* 06/16/2015   TRIG 64.0 06/16/2015   HDL 82.20 06/16/2015   LDLCALC 125* 06/16/2015   ALT 13 11/12/2015   AST 19 11/12/2015   NA 139 11/12/2015   K 4.1 11/12/2015   CL 103 11/12/2015   CREATININE 0.78 11/12/2015   BUN 18 11/12/2015   CO2 30 11/12/2015   TSH 1.13 06/16/2015        Assessment & Plan:   Problem List Items Addressed This Visit    BARRETTS ESOPHAGUS    On omeprazole.  Better.  Declines further w/up.       GERD (gastroesophageal reflux disease)    Acid reflux better.  States does not notice acid reflux now.  Follow.  Has declined further GI w/up.        HYPERCHOLESTEROLEMIA    Follow lipid panel.       Muscle cramps    Check cbc and metabolic panel to confirm potassium, calcium, etc wnl.  Stay hydrated.  Just an intermittent issue.        Obstructive sleep apnea    CPAP.         Other Visit Diagnoses    Hand cramps    -  Primary    Relevant Orders    Comprehensive metabolic panel (Completed)    CBC with Differential/Platelet (Completed)    Cramp of both lower extremities        Relevant Orders    Comprehensive metabolic panel (Completed)    CBC with Differential/Platelet (Completed)        Einar Pheasant, MD

## 2015-11-14 ENCOUNTER — Encounter: Payer: Self-pay | Admitting: Internal Medicine

## 2015-11-14 DIAGNOSIS — R252 Cramp and spasm: Secondary | ICD-10-CM | POA: Insufficient documentation

## 2015-11-14 NOTE — Assessment & Plan Note (Signed)
Acid reflux better.  States does not notice acid reflux now.  Follow.  Has declined further GI w/up.

## 2015-11-14 NOTE — Assessment & Plan Note (Signed)
CPAP.  

## 2015-11-14 NOTE — Assessment & Plan Note (Signed)
Check cbc and metabolic panel to confirm potassium, calcium, etc wnl.  Stay hydrated.  Just an intermittent issue.

## 2015-11-14 NOTE — Assessment & Plan Note (Signed)
Follow lipid panel.   

## 2015-11-14 NOTE — Assessment & Plan Note (Signed)
On omeprazole.  Better.  Declines further w/up.

## 2015-11-16 ENCOUNTER — Encounter: Payer: Self-pay | Admitting: *Deleted

## 2015-12-11 ENCOUNTER — Ambulatory Visit: Payer: Medicare Other | Admitting: Internal Medicine

## 2015-12-15 DIAGNOSIS — H35363 Drusen (degenerative) of macula, bilateral: Secondary | ICD-10-CM | POA: Diagnosis not present

## 2015-12-15 DIAGNOSIS — H353132 Nonexudative age-related macular degeneration, bilateral, intermediate dry stage: Secondary | ICD-10-CM | POA: Diagnosis not present

## 2015-12-15 DIAGNOSIS — H5211 Myopia, right eye: Secondary | ICD-10-CM | POA: Diagnosis not present

## 2015-12-15 DIAGNOSIS — H47233 Glaucomatous optic atrophy, bilateral: Secondary | ICD-10-CM | POA: Diagnosis not present

## 2015-12-15 DIAGNOSIS — H04123 Dry eye syndrome of bilateral lacrimal glands: Secondary | ICD-10-CM | POA: Diagnosis not present

## 2015-12-15 DIAGNOSIS — H524 Presbyopia: Secondary | ICD-10-CM | POA: Diagnosis not present

## 2015-12-15 DIAGNOSIS — H1820 Unspecified corneal edema: Secondary | ICD-10-CM | POA: Diagnosis not present

## 2015-12-15 DIAGNOSIS — H52221 Regular astigmatism, right eye: Secondary | ICD-10-CM | POA: Diagnosis not present

## 2015-12-15 DIAGNOSIS — H401123 Primary open-angle glaucoma, left eye, severe stage: Secondary | ICD-10-CM | POA: Diagnosis not present

## 2015-12-15 DIAGNOSIS — H401113 Primary open-angle glaucoma, right eye, severe stage: Secondary | ICD-10-CM | POA: Diagnosis not present

## 2015-12-30 ENCOUNTER — Telehealth: Payer: Self-pay | Admitting: Internal Medicine

## 2015-12-30 DIAGNOSIS — R413 Other amnesia: Secondary | ICD-10-CM

## 2015-12-30 NOTE — Telephone Encounter (Signed)
Pt called would like a referral to go see a doctor in [place] for memory loss. Pt was not sure what that doctor is called.   Call pt @ [phone]. Thank you!

## 2015-12-30 NOTE — Telephone Encounter (Signed)
Order placed for neurology referral.   

## 2015-12-30 NOTE — Telephone Encounter (Signed)
If she is wanting to see a neurologist, I can get her scheduled with one here in town if she is agreeable.  Just let me know.  Thanks

## 2015-12-30 NOTE — Telephone Encounter (Signed)
Yes either here or [place], she just doesn't want to go to [facility] if possible.

## 2015-12-30 NOTE — Telephone Encounter (Signed)
Spoke with the patient, she believes she has one of the symptoms of alzheimers and would like to been seen by a specialist to get tested, please advise. thanks

## 2016-01-04 DIAGNOSIS — H903 Sensorineural hearing loss, bilateral: Secondary | ICD-10-CM | POA: Diagnosis not present

## 2016-01-04 DIAGNOSIS — H60543 Acute eczematoid otitis externa, bilateral: Secondary | ICD-10-CM | POA: Diagnosis not present

## 2016-01-04 DIAGNOSIS — H6123 Impacted cerumen, bilateral: Secondary | ICD-10-CM | POA: Diagnosis not present

## 2016-01-08 DIAGNOSIS — H16142 Punctate keratitis, left eye: Secondary | ICD-10-CM | POA: Diagnosis not present

## 2016-01-08 DIAGNOSIS — S0502XA Injury of conjunctiva and corneal abrasion without foreign body, left eye, initial encounter: Secondary | ICD-10-CM | POA: Diagnosis not present

## 2016-01-21 DIAGNOSIS — H524 Presbyopia: Secondary | ICD-10-CM | POA: Diagnosis not present

## 2016-01-21 DIAGNOSIS — H52221 Regular astigmatism, right eye: Secondary | ICD-10-CM | POA: Diagnosis not present

## 2016-01-21 DIAGNOSIS — H401123 Primary open-angle glaucoma, left eye, severe stage: Secondary | ICD-10-CM | POA: Diagnosis not present

## 2016-01-21 DIAGNOSIS — H04123 Dry eye syndrome of bilateral lacrimal glands: Secondary | ICD-10-CM | POA: Diagnosis not present

## 2016-01-21 DIAGNOSIS — H5211 Myopia, right eye: Secondary | ICD-10-CM | POA: Diagnosis not present

## 2016-01-21 DIAGNOSIS — H353132 Nonexudative age-related macular degeneration, bilateral, intermediate dry stage: Secondary | ICD-10-CM | POA: Diagnosis not present

## 2016-01-21 DIAGNOSIS — H401113 Primary open-angle glaucoma, right eye, severe stage: Secondary | ICD-10-CM | POA: Diagnosis not present

## 2016-01-21 DIAGNOSIS — H47233 Glaucomatous optic atrophy, bilateral: Secondary | ICD-10-CM | POA: Diagnosis not present

## 2016-01-21 DIAGNOSIS — H1820 Unspecified corneal edema: Secondary | ICD-10-CM | POA: Diagnosis not present

## 2016-01-21 DIAGNOSIS — H35363 Drusen (degenerative) of macula, bilateral: Secondary | ICD-10-CM | POA: Diagnosis not present

## 2016-02-11 ENCOUNTER — Encounter: Payer: Self-pay | Admitting: Internal Medicine

## 2016-02-11 ENCOUNTER — Ambulatory Visit (INDEPENDENT_AMBULATORY_CARE_PROVIDER_SITE_OTHER): Payer: Medicare Other | Admitting: Internal Medicine

## 2016-02-11 VITALS — BP 120/80 | HR 80 | Temp 97.7°F | Resp 17 | Ht 59.0 in | Wt 104.1 lb

## 2016-02-11 DIAGNOSIS — K2270 Barrett's esophagus without dysplasia: Secondary | ICD-10-CM

## 2016-02-11 DIAGNOSIS — K2100 Gastro-esophageal reflux disease with esophagitis, without bleeding: Secondary | ICD-10-CM

## 2016-02-11 DIAGNOSIS — M542 Cervicalgia: Secondary | ICD-10-CM

## 2016-02-11 DIAGNOSIS — E7800 Pure hypercholesterolemia, unspecified: Secondary | ICD-10-CM | POA: Diagnosis not present

## 2016-02-11 NOTE — Assessment & Plan Note (Signed)
Only hurts with rotation of her head to the right.  Tylenol.  Discussed proper posture and sleeping.

## 2016-02-11 NOTE — Assessment & Plan Note (Signed)
On omeprazole.  Declines f/u EGD.

## 2016-02-11 NOTE — Progress Notes (Signed)
Pre-visit discussion using our clinic review tool. No additional management support is needed unless otherwise documented below in the visit note.  

## 2016-02-11 NOTE — Patient Instructions (Signed)
Would take tylenol instead of advil, alleve, ibuprofen.

## 2016-02-11 NOTE — Assessment & Plan Note (Signed)
Still with some acid reflux.  On omeprazole.  Declines EGD f/u.  States omeprazole controls relatively well.

## 2016-02-11 NOTE — Assessment & Plan Note (Signed)
Follow lipid panel.   

## 2016-02-11 NOTE — Progress Notes (Signed)
Patient ID: Christy Cisneros, female   DOB: 1920-02-27, 82 y.o.   MRN: [id number]   Subjective:    Patient ID: Christy Cisneros, female    DOB: 11-22-1919, 82 y.o.   MRN: [id number]  HPI  Patient here for a scheduled follow up.  Woke up with neck pain this am. Notices when looks to the right.  Has not been a problem for her previously.  No headache.  No chest pain.  Breathing stable.  Occasional acid reflux.  No swallowing problems.  Takes omeprzole.  Feels this works well for her.  Declines f/u EGD.  No abdominal pain or cramping.  Bowels stable.  Discussed the need to eat regular meals.  States she gets busy and forgets to eat lunch.  Weight is down a couple of pounds from the last check.     Past Medical History  Diagnosis Date  . Chronic bronchitis ([place])   . Hypercholesteremia   . GERD (gastroesophageal reflux disease)   . Barrett's esophagus with esophagitis   . Sleep apnea     has CPAP  . Glaucoma   . TIA (transient ischemic attack)   . Osteoporosis   . Osteoarthritis    Past Surgical History  Procedure Laterality Date  . Bunionectomy      left  . Rotator cuff repair      right   Family History  Problem Relation Age of Onset  . Breast cancer Sister   . Heart disease Mother    Social History   Social History  . Marital Status: Single    Spouse Name: N/A  . Number of Children: N/A  . Years of Education: N/A   Social History Main Topics  . Smoking status: Never Smoker   . Smokeless tobacco: Never Used  . Alcohol Use: 0.0 oz/week    0 Standard drinks or equivalent per week     Comment: wine occasional  . Drug Use: No  . Sexual Activity: Not Asked   Other Topics Concern  . None   Social History Narrative    Outpatient Encounter Prescriptions as of 02/11/2016  Medication Sig  . Aloe LIQD Take by mouth daily.   Marland Kitchen aspirin 81 MG tablet Take 81 mg by mouth daily.  . Bilberry, Vaccinium myrtillus, (BILBERRY PO) Take 1 capsule by mouth daily.  . calcium  carbonate (OS-CAL) 600 MG TABS Take 600 mg by mouth daily.  . Flaxseed, Linseed, (GROUND FLAX SEEDS PO) Take by mouth daily.   . Multiple Vitamins-Minerals (PRESERVISION AREDS 2 PO) Take by mouth 2 (two) times daily.  Marland Kitchen omeprazole (PRILOSEC) 20 MG capsule   . prednisoLONE acetate (PRED FORTE) 1 % ophthalmic suspension Place 1 drop into the left eye 2 (two) times daily.  . timolol (BETIMOL) 0.5 % ophthalmic solution Place 1 drop into the right eye daily.  . dorzolamide-timolol (COSOPT) 22.3-6.8 MG/ML ophthalmic solution Apply to eye. Reported on 02/11/2016  . erythromycin ophthalmic ointment Reported on 02/11/2016  . latanoprost (XALATAN) 0.005 % ophthalmic solution Reported on 02/11/2016   No facility-administered encounter medications on file as of 02/11/2016.    Review of Systems  Constitutional: Negative for fever.       Some weight loss.    HENT: Negative for congestion and sinus pressure.   Respiratory: Negative for cough, chest tightness and shortness of breath.   Cardiovascular: Negative for chest pain, palpitations and leg swelling.  Gastrointestinal: Negative for nausea, vomiting, abdominal pain and diarrhea.  Genitourinary: Negative for dysuria  and difficulty urinating.  Musculoskeletal: Negative for back pain.       Neck pain as outlined.   Skin: Negative for color change and rash.  Neurological: Negative for dizziness, light-headedness and headaches.       Some memory change.   Psychiatric/Behavioral: Negative for dysphoric mood and agitation.       Objective:    Physical Exam  Constitutional: She appears well-developed and well-nourished. No distress.  HENT:  Nose: Nose normal.  Mouth/Throat: Oropharynx is clear and moist.  Neck: Neck supple. No thyromegaly present.  Cardiovascular: Normal rate and regular rhythm.   1/6 systolic murmur.   Pulmonary/Chest: Breath sounds normal. No respiratory distress. She has no wheezes.  Abdominal: Soft. Bowel sounds are normal.  There is no tenderness.  Musculoskeletal: She exhibits no edema or tenderness.  Lymphadenopathy:    She has no cervical adenopathy.  Skin: No rash noted. No erythema.  Psychiatric: She has a normal mood and affect. Her behavior is normal.    BP 120/80 mmHg  Pulse 80  Temp(Src) 97.7 F (36.5 C) (Oral)  Resp 17  Ht 4\' 11"  (1.499 m)  Wt 104 lb 2 oz (47.231 kg)  BMI 21.02 kg/m2  SpO2 98% Wt Readings from Last 3 Encounters:  02/11/16 104 lb 2 oz (47.231 kg)  11/12/15 106 lb (48.081 kg)  09/29/15 108 lb 3.2 oz (49.079 kg)     Lab Results  Component Value Date   WBC 7.0 11/12/2015   HGB 13.4 11/12/2015   HCT 40.1 11/12/2015   PLT 284.0 11/12/2015   GLUCOSE 113* 11/12/2015   CHOL 220* 06/16/2015   TRIG 64.0 06/16/2015   HDL 82.20 06/16/2015   LDLCALC 125* 06/16/2015   ALT 13 11/12/2015   AST 19 11/12/2015   NA 139 11/12/2015   K 4.1 11/12/2015   CL 103 11/12/2015   CREATININE 0.78 11/12/2015   BUN 18 11/12/2015   CO2 30 11/12/2015   TSH 1.13 06/16/2015    Dg Chest 2 View  06/16/2015  CLINICAL DATA:  Nonproductive cough. EXAM: CHEST  2 VIEW COMPARISON:  August 20, 2012. FINDINGS: The heart size and mediastinal contours are within normal limits. Both lungs are clear. No pneumothorax or pleural effusion is noted. Hyperexpansion of the lungs is noted consistent with chronic obstructive pulmonary disease. Status post left shoulder arthroplasty. IMPRESSION: Findings consistent with chronic obstructive pulmonary disease. No acute cardiopulmonary abnormality seen. Electronically Signed   By: Marijo Conception, M.D.   On: 06/16/2015 16:48   Ct Head Wo Contrast  06/16/2015  CLINICAL DATA:  Severe frontal headache, blurred vision for several days EXAM: CT HEAD WITHOUT CONTRAST TECHNIQUE: Contiguous axial images were obtained from the base of the skull through the vertex without intravenous contrast. COMPARISON:  CT brain scan of 12/06/2012 FINDINGS: The ventricular system is stable  being slightly prominent, and there is considerable diffuse prominence of the cortical sulci and cerebellar folia consistent with diffuse atrophy. Moderate small vessel ischemic change throughout the periventricular white matter is unchanged. No hemorrhage, mass lesion, or acute infarction is seen. On bone window images, there is extensive ethmoid sinus disease. There may also be and air-fluid level in the left maxillary sinus, but the maxillary sinuses are not well seen. The sphenoid and frontal sinuses appear pneumatized. No calvarial abnormality is seen. IMPRESSION: 1. Stable atrophy and moderate small vessel ischemic change throughout the periventricular white matter. No acute intracranial abnormality. 2. Ethmoid sinusitis and possible left maxillary sinus disease as well.  Electronically Signed   By: Ivar Drape M.D.   On: 06/16/2015 13:54       Assessment & Plan:   Problem List Items Addressed This Visit    BARRETTS ESOPHAGUS    On omeprazole.  Declines f/u EGD.       GERD (gastroesophageal reflux disease) - Primary    Still with some acid reflux.  On omeprazole.  Declines EGD f/u.  States omeprazole controls relatively well.       HYPERCHOLESTEROLEMIA    Follow lipid panel.       Neck pain    Only hurts with rotation of her head to the right.  Tylenol.  Discussed proper posture and sleeping.            Einar Pheasant, MD

## 2016-02-12 ENCOUNTER — Telehealth: Payer: Self-pay | Admitting: *Deleted

## 2016-02-12 NOTE — Telephone Encounter (Signed)
Reviewed MD note from 02/11/16 called patient advised note stated to use tylenol. Patient had tylenol 500 mg on hand advised patient that would be ok to take 1 tablet every 6 hours an do to age not to take more 2000 MG per day.

## 2016-02-12 NOTE — Telephone Encounter (Signed)
Patient stated that she was advised to take either tylenol or Advil for her minor pain, however was not sure which medication was advise  Pt contact [phone] A message can be left on voicemail

## 2016-02-18 DIAGNOSIS — M79674 Pain in right toe(s): Secondary | ICD-10-CM | POA: Diagnosis not present

## 2016-02-18 DIAGNOSIS — B351 Tinea unguium: Secondary | ICD-10-CM | POA: Diagnosis not present

## 2016-02-18 DIAGNOSIS — M79675 Pain in left toe(s): Secondary | ICD-10-CM | POA: Diagnosis not present

## 2016-02-24 DIAGNOSIS — H903 Sensorineural hearing loss, bilateral: Secondary | ICD-10-CM | POA: Diagnosis not present

## 2016-02-24 DIAGNOSIS — H6123 Impacted cerumen, bilateral: Secondary | ICD-10-CM | POA: Diagnosis not present

## 2016-03-04 DIAGNOSIS — H5211 Myopia, right eye: Secondary | ICD-10-CM | POA: Diagnosis not present

## 2016-03-04 DIAGNOSIS — H52221 Regular astigmatism, right eye: Secondary | ICD-10-CM | POA: Diagnosis not present

## 2016-03-04 DIAGNOSIS — H35363 Drusen (degenerative) of macula, bilateral: Secondary | ICD-10-CM | POA: Diagnosis not present

## 2016-03-04 DIAGNOSIS — H47233 Glaucomatous optic atrophy, bilateral: Secondary | ICD-10-CM | POA: Diagnosis not present

## 2016-03-04 DIAGNOSIS — H401113 Primary open-angle glaucoma, right eye, severe stage: Secondary | ICD-10-CM | POA: Diagnosis not present

## 2016-03-04 DIAGNOSIS — H401123 Primary open-angle glaucoma, left eye, severe stage: Secondary | ICD-10-CM | POA: Diagnosis not present

## 2016-03-04 DIAGNOSIS — H353132 Nonexudative age-related macular degeneration, bilateral, intermediate dry stage: Secondary | ICD-10-CM | POA: Diagnosis not present

## 2016-03-04 DIAGNOSIS — S0502XA Injury of conjunctiva and corneal abrasion without foreign body, left eye, initial encounter: Secondary | ICD-10-CM | POA: Diagnosis not present

## 2016-03-04 DIAGNOSIS — H04123 Dry eye syndrome of bilateral lacrimal glands: Secondary | ICD-10-CM | POA: Diagnosis not present

## 2016-03-04 DIAGNOSIS — H524 Presbyopia: Secondary | ICD-10-CM | POA: Diagnosis not present

## 2016-03-05 DIAGNOSIS — H47233 Glaucomatous optic atrophy, bilateral: Secondary | ICD-10-CM | POA: Diagnosis not present

## 2016-03-05 DIAGNOSIS — H35363 Drusen (degenerative) of macula, bilateral: Secondary | ICD-10-CM | POA: Diagnosis not present

## 2016-03-05 DIAGNOSIS — H401113 Primary open-angle glaucoma, right eye, severe stage: Secondary | ICD-10-CM | POA: Diagnosis not present

## 2016-03-05 DIAGNOSIS — H353132 Nonexudative age-related macular degeneration, bilateral, intermediate dry stage: Secondary | ICD-10-CM | POA: Diagnosis not present

## 2016-03-05 DIAGNOSIS — H52221 Regular astigmatism, right eye: Secondary | ICD-10-CM | POA: Diagnosis not present

## 2016-03-05 DIAGNOSIS — H401123 Primary open-angle glaucoma, left eye, severe stage: Secondary | ICD-10-CM | POA: Diagnosis not present

## 2016-03-05 DIAGNOSIS — H04123 Dry eye syndrome of bilateral lacrimal glands: Secondary | ICD-10-CM | POA: Diagnosis not present

## 2016-03-05 DIAGNOSIS — H5211 Myopia, right eye: Secondary | ICD-10-CM | POA: Diagnosis not present

## 2016-03-05 DIAGNOSIS — S0502XA Injury of conjunctiva and corneal abrasion without foreign body, left eye, initial encounter: Secondary | ICD-10-CM | POA: Diagnosis not present

## 2016-03-05 DIAGNOSIS — H524 Presbyopia: Secondary | ICD-10-CM | POA: Diagnosis not present

## 2016-03-10 DIAGNOSIS — M810 Age-related osteoporosis without current pathological fracture: Secondary | ICD-10-CM | POA: Diagnosis not present

## 2016-03-10 DIAGNOSIS — H1712 Central corneal opacity, left eye: Secondary | ICD-10-CM | POA: Diagnosis not present

## 2016-03-14 DIAGNOSIS — H04123 Dry eye syndrome of bilateral lacrimal glands: Secondary | ICD-10-CM | POA: Diagnosis not present

## 2016-03-14 DIAGNOSIS — S0502XD Injury of conjunctiva and corneal abrasion without foreign body, left eye, subsequent encounter: Secondary | ICD-10-CM | POA: Diagnosis not present

## 2016-03-14 DIAGNOSIS — H52221 Regular astigmatism, right eye: Secondary | ICD-10-CM | POA: Diagnosis not present

## 2016-03-14 DIAGNOSIS — H35363 Drusen (degenerative) of macula, bilateral: Secondary | ICD-10-CM | POA: Diagnosis not present

## 2016-03-14 DIAGNOSIS — H5211 Myopia, right eye: Secondary | ICD-10-CM | POA: Diagnosis not present

## 2016-03-14 DIAGNOSIS — H524 Presbyopia: Secondary | ICD-10-CM | POA: Diagnosis not present

## 2016-03-14 DIAGNOSIS — H1820 Unspecified corneal edema: Secondary | ICD-10-CM | POA: Diagnosis not present

## 2016-03-14 DIAGNOSIS — H353132 Nonexudative age-related macular degeneration, bilateral, intermediate dry stage: Secondary | ICD-10-CM | POA: Diagnosis not present

## 2016-03-14 DIAGNOSIS — H401123 Primary open-angle glaucoma, left eye, severe stage: Secondary | ICD-10-CM | POA: Diagnosis not present

## 2016-03-14 DIAGNOSIS — H47233 Glaucomatous optic atrophy, bilateral: Secondary | ICD-10-CM | POA: Diagnosis not present

## 2016-03-14 DIAGNOSIS — H401113 Primary open-angle glaucoma, right eye, severe stage: Secondary | ICD-10-CM | POA: Diagnosis not present

## 2016-03-17 DIAGNOSIS — M810 Age-related osteoporosis without current pathological fracture: Secondary | ICD-10-CM | POA: Diagnosis not present

## 2016-03-18 DIAGNOSIS — H47233 Glaucomatous optic atrophy, bilateral: Secondary | ICD-10-CM | POA: Diagnosis not present

## 2016-03-18 DIAGNOSIS — H401123 Primary open-angle glaucoma, left eye, severe stage: Secondary | ICD-10-CM | POA: Diagnosis not present

## 2016-03-18 DIAGNOSIS — H401113 Primary open-angle glaucoma, right eye, severe stage: Secondary | ICD-10-CM | POA: Diagnosis not present

## 2016-03-18 DIAGNOSIS — H04123 Dry eye syndrome of bilateral lacrimal glands: Secondary | ICD-10-CM | POA: Diagnosis not present

## 2016-03-18 DIAGNOSIS — H35363 Drusen (degenerative) of macula, bilateral: Secondary | ICD-10-CM | POA: Diagnosis not present

## 2016-03-18 DIAGNOSIS — H52221 Regular astigmatism, right eye: Secondary | ICD-10-CM | POA: Diagnosis not present

## 2016-03-18 DIAGNOSIS — H353132 Nonexudative age-related macular degeneration, bilateral, intermediate dry stage: Secondary | ICD-10-CM | POA: Diagnosis not present

## 2016-03-18 DIAGNOSIS — S0502XD Injury of conjunctiva and corneal abrasion without foreign body, left eye, subsequent encounter: Secondary | ICD-10-CM | POA: Diagnosis not present

## 2016-03-18 DIAGNOSIS — H5211 Myopia, right eye: Secondary | ICD-10-CM | POA: Diagnosis not present

## 2016-03-18 DIAGNOSIS — H524 Presbyopia: Secondary | ICD-10-CM | POA: Diagnosis not present

## 2016-03-18 DIAGNOSIS — H1820 Unspecified corneal edema: Secondary | ICD-10-CM | POA: Diagnosis not present

## 2016-03-22 DIAGNOSIS — H44512 Absolute glaucoma, left eye: Secondary | ICD-10-CM | POA: Diagnosis not present

## 2016-03-22 DIAGNOSIS — H401433 Capsular glaucoma with pseudoexfoliation of lens, bilateral, severe stage: Secondary | ICD-10-CM | POA: Diagnosis not present

## 2016-03-29 DIAGNOSIS — M810 Age-related osteoporosis without current pathological fracture: Secondary | ICD-10-CM | POA: Diagnosis not present

## 2016-04-15 ENCOUNTER — Emergency Department
Admission: EM | Admit: 2016-04-15 | Discharge: 2016-04-15 | Disposition: A | Discharge status: Home / Self Care | Payer: Medicare Other | Attending: Emergency Medicine | Admitting: Emergency Medicine

## 2016-04-15 ENCOUNTER — Emergency Department: Payer: Medicare Other

## 2016-04-15 ENCOUNTER — Encounter: Payer: Self-pay | Admitting: Emergency Medicine

## 2016-04-15 DIAGNOSIS — R531 Weakness: Secondary | ICD-10-CM | POA: Diagnosis not present

## 2016-04-15 DIAGNOSIS — Z8673 Personal history of transient ischemic attack (TIA), and cerebral infarction without residual deficits: Secondary | ICD-10-CM | POA: Diagnosis not present

## 2016-04-15 DIAGNOSIS — R6889 Other general symptoms and signs: Secondary | ICD-10-CM | POA: Diagnosis not present

## 2016-04-15 DIAGNOSIS — Z23 Encounter for immunization: Secondary | ICD-10-CM | POA: Diagnosis not present

## 2016-04-15 DIAGNOSIS — R42 Dizziness and giddiness: Secondary | ICD-10-CM | POA: Insufficient documentation

## 2016-04-15 DIAGNOSIS — Z79899 Other long term (current) drug therapy: Secondary | ICD-10-CM | POA: Insufficient documentation

## 2016-04-15 DIAGNOSIS — Z7982 Long term (current) use of aspirin: Secondary | ICD-10-CM | POA: Diagnosis not present

## 2016-04-15 LAB — BASIC METABOLIC PANEL
ANION GAP: 7 (ref 5–15)
BUN: 20 mg/dL (ref 6–20)
CHLORIDE: 104 mmol/L (ref 101–111)
CO2: 28 mmol/L (ref 22–32)
CREATININE: 0.79 mg/dL (ref 0.44–1.00)
Calcium: 9.4 mg/dL (ref 8.9–10.3)
GFR calc non Af Amer: >60 mL/min (ref >60)
Glucose, Bld: 114 mg/dL — ABNORMAL HIGH (ref 65–99)
POTASSIUM: 3.4 mmol/L — AB (ref 3.5–5.1)
SODIUM: 139 mmol/L (ref 135–145)

## 2016-04-15 LAB — CBC
HCT: 41.3 % (ref 35.0–47.0)
HEMOGLOBIN: 13.8 g/dL (ref 12.0–16.0)
MCH: 30.6 pg (ref 26.0–34.0)
MCHC: 33.5 g/dL (ref 32.0–36.0)
MCV: 91.5 fL (ref 80.0–100.0)
PLATELETS: 264 10*3/uL (ref 150–440)
RBC: 4.51 MIL/uL (ref 3.80–5.20)
RDW: 14 % (ref 11.5–14.5)
WBC: 6.8 10*3/uL (ref 3.6–11.0)

## 2016-04-15 LAB — URINALYSIS COMPLETE WITH MICROSCOPIC (ARMC ONLY)
BILIRUBIN URINE: NEGATIVE
Bacteria, UA: NONE SEEN
Glucose, UA: NEGATIVE mg/dL
HGB URINE DIPSTICK: NEGATIVE
KETONES UR: NEGATIVE mg/dL
LEUKOCYTES UA: NEGATIVE
Nitrite: NEGATIVE
PH: 5 (ref 5.0–8.0)
PROTEIN: NEGATIVE mg/dL
Specific Gravity, Urine: 1.013 (ref 1.005–1.030)

## 2016-04-15 LAB — TROPONIN I: Troponin I: <0.03 ng/mL (ref <0.03)

## 2016-04-15 MED ORDER — MECLIZINE HCL 25 MG PO TABS: 12.5000 mg | ORAL_TABLET | Freq: Three times a day (TID) | ORAL | 0 refills | Status: DC | PRN

## 2016-04-15 NOTE — ED Provider Notes (Addendum)
[facility] Emergency Department Provider Note  ____________________________________________   I have reviewed the triage vital signs and the nursing notes.   HISTORY  Chief Complaint Dizziness    HPI Christy Cisneros is a 82 y.o. 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 Patient had the room spinning. She states that she was nauseated but did not vomit. This is similar to prior event. She states that at this time she has no symptoms she is back to baseline without to go home. She specifically denies any chest pain shows breath or focal neurologic complaint. She denies headache or fall.    Past Medical History:  Diagnosis Date  . Barrett's esophagus with esophagitis   . Chronic bronchitis ([place])   . GERD (gastroesophageal reflux disease)   . Glaucoma   . Hypercholesteremia   . Osteoarthritis   . Osteoporosis   . Sleep apnea    has CPAP  . TIA (transient ischemic attack)     Patient Active Problem List   Diagnosis Date Noted  . Neck pain 02/11/2016  . Muscle cramps 11/14/2015  . Urine incontinence 08/16/2015  . Hematoma 08/16/2015  . Fatigue 07/12/2015  . Headache 06/16/2015  . Unsteady gait 06/01/2015  . GERD (gastroesophageal reflux disease) 08/11/2014  . UTI (urinary tract infection) 08/07/2014  . DYSPNEA 04/17/2009  . HYPERCHOLESTEROLEMIA 04/16/2009  . Obstructive sleep apnea 04/16/2009  . Glaucoma 04/16/2009  . Transient cerebral ischemia 04/16/2009  . BARRETTS ESOPHAGUS 04/16/2009  . OSTEOARTHRITIS 04/16/2009  . Osteoporosis 04/16/2009    Past Surgical History:  Procedure Laterality Date  . BUNIONECTOMY     left  . ROTATOR CUFF REPAIR     right    Prior to Admission medications   Medication Sig Start Date End Date Taking?  Authorizing Provider  Aloe LIQD Take by mouth daily.     Historical Provider, MD  aspirin 81 MG tablet Take 81 mg by mouth daily.    Historical Provider, MD  Bilberry, Vaccinium myrtillus, (BILBERRY PO) Take 1 capsule by mouth daily.    Historical Provider, MD  calcium carbonate (OS-CAL) 600 MG TABS Take 600 mg by mouth daily.    Historical Provider, MD  dorzolamide-timolol (COSOPT) 22.3-6.8 MG/ML ophthalmic solution Apply to eye. Reported on 02/11/2016 12/10/14   Historical Provider, MD  erythromycin ophthalmic ointment Reported on 02/11/2016 12/08/14   Historical Provider, MD  Flaxseed, Linseed, (GROUND FLAX SEEDS PO) Take by mouth daily.     Historical Provider, MD  latanoprost (XALATAN) 0.005 % ophthalmic solution Reported on 02/11/2016 12/10/14   Historical Provider, MD  Multiple Vitamins-Minerals (PRESERVISION AREDS 2 PO) Take by mouth 2 (two) times daily.    Historical Provider, MD  omeprazole (PRILOSEC) 20 MG capsule  05/12/15   Historical Provider, MD  prednisoLONE acetate (PRED FORTE) 1 % ophthalmic suspension Place 1 drop into the left eye 2 (two) times daily.    Historical Provider, MD  timolol (BETIMOL) 0.5 % ophthalmic solution Place 1 drop into the right eye daily.    Historical Provider, MD    Allergies Alphagan [brimonidine]; Avelox [moxifloxacin hcl in nacl]; Bacitracin; Cefdinir; Cephalexin; Clarithromycin; Codeine; Erythromycin; Hydrocodone-acetaminophen; Lansoprazole; Polymyxin b; and Vigamox [moxifloxacin]  Family History  Problem Relation Age of Onset  . Breast cancer Sister   . Heart disease  Mother     Social History Social History  Substance Use Topics  . Smoking status: Never Smoker  . Smokeless tobacco: Never Used  . Alcohol use 0.0 oz/week     Comment: wine occasional    Review of Systems Constitutional: No fever/chills Eyes: No visual changes. ENT: No sore throat. No stiff neck no neck pain Cardiovascular: Denies chest pain. Respiratory: Denies shortness of  breath. Gastrointestinal:   no vomiting.  No diarrhea.  No constipation. Genitourinary: Negative for dysuria. Musculoskeletal: Negative lower extremity swelling Skin: Negative for rash. Neurological: Negative for severe headaches, focal weakness or numbness. 10-point ROS otherwise negative.  ____________________________________________   PHYSICAL EXAM:  VITAL SIGNS: ED Triage Vitals  Enc Vitals Group     BP 04/15/16 1202 (!) 119/98     Pulse Rate 04/15/16 1202 77     Resp 04/15/16 1202 20     Temp 04/15/16 1202 98 F (36.7 C)     Temp Source 04/15/16 1202 Axillary     SpO2 04/15/16 1202 98 %     Weight 04/15/16 1212 102 lb (46.3 kg)     Height 04/15/16 1212 5\' 1"  (1.549 m)     Head Circumference --      Peak Flow --      Pain Score --      Pain Loc --      Pain Edu? --      Excl. in [place]? --     Constitutional: Alert and oriented. Well appearing and in no acute distress. Eyes: Conjunctivae are normal. PERRL. EOMI. Head: Atraumatic. Nose: No congestion/rhinnorhea. Mouth/Throat: Mucous membranes are moist.  Oropharynx non-erythematous. Neck: No stridor.   Nontender with no meningismus Cardiovascular: Normal rate, regular rhythm. Grossly normal heart sounds.  Good peripheral circulation. Respiratory: Normal respiratory effort.  No retractions. Lungs CTAB. Abdominal: Soft and nontender. No distention. No guarding no rebound Back:  There is no focal tenderness or step off.  there is no midline tenderness there are no lesions noted. there is no CVA tenderness Musculoskeletal: No lower extremity tenderness, no upper extremity tenderness. No joint effusions, no DVT signs strong distal pulses no edema Neurologic:  Cranial nerves II through XII are grossly intact 5 out of 5 strength bilateral upper and lower extremity. Finger to nose within normal limits heel to shin within normal limits, speech is normal with no word finding difficulty or dysarthria, reflexes symmetric, pupils are  equally round and reactive to light, there is no pronator drift, sensation is normal, vision is intact to confrontation, gait is deferred, there is no nystagmus, normal neurologic exam Skin:  Skin is warm, dry and intact. No rash noted. Psychiatric: Mood and affect are normal. Speech and behavior are normal.  ____________________________________________   LABS (all labs ordered are listed, but only abnormal results are displayed)  Labs Reviewed  BASIC METABOLIC PANEL  CBC  URINALYSIS COMPLETEWITH MICROSCOPIC ([place])  CBG MONITORING, ED   ____________________________________________  EKG  I personally interpreted any EKGs ordered by me or triage Sinus rhythm at 80 bpm, some wandering baseline limits interpretation but no acute ST elevation or depression. ____________________________________________  [id number]  I reviewed any imaging ordered by me or triage that were performed during my shift and, if possible, patient and/or family made aware of any abnormal findings. ____________________________________________   PROCEDURES  Procedure(s) performed: None  Procedures  Critical Care performed: None  ____________________________________________   INITIAL IMPRESSION / ASSESSMENT AND PLAN / ED COURSE  Pertinent labs & imaging  results that were available during my care of the patient were reviewed by me and considered in my medical decision making (see chart for details).  *Patient with a normal neurologic exam including cerebellar function at this time presents after having an episode of vertigo. Because she is in her mid 41s we'll obtain a CT scan of her head. Patient is otherwise alert and oriented in no acute distress with no evidence of acute CVA. TMs are normal with the exception of some earwax there is no evidence of impaction there is however some earwax up against the TM on the right.  ----------------------------------------- 2:22 PM on  04/15/2016 -----------------------------------------  Patient remains absolutely asymptomatic she is eager to go home.  Neuro exam remains completely normal, patient eating and drinking, requesting discharge. She declines offered admission for further workup and to the hospital. [facility] set her up with [facility] as needed and return precautions given and understood.  Clinical Course   ____________________________________________   FINAL CLINICAL IMPRESSION(S) / ED DIAGNOSES  Final diagnoses:  None      This chart was dictated using voice recognition software.  Despite best efforts to proofread,  errors can occur which can change meaning.      Schuyler Amor, MD 04/15/16 [place], MD 04/15/16 [place], MD 04/15/16 [phone]

## 2016-04-15 NOTE — ED Notes (Signed)
Pt given crackers and water to drink per request

## 2016-04-15 NOTE — ED Notes (Signed)
Pt aware UA needed. Unable to provide at this time, will recheck.

## 2016-04-15 NOTE — ED Triage Notes (Signed)
Pt presents with dizziness after walking to her neighbor's house. She generally walks daily, bu began to feel dizzy upon arrival and her neighbor called EMS. Pt states she feels "lousy." States she feels better now but that she felt like the world was spinning, then nausea. EMS reports CBG 136, VS WNL.

## 2016-04-19 DIAGNOSIS — H401113 Primary open-angle glaucoma, right eye, severe stage: Secondary | ICD-10-CM | POA: Diagnosis not present

## 2016-04-19 DIAGNOSIS — H04123 Dry eye syndrome of bilateral lacrimal glands: Secondary | ICD-10-CM | POA: Diagnosis not present

## 2016-04-19 DIAGNOSIS — H47233 Glaucomatous optic atrophy, bilateral: Secondary | ICD-10-CM | POA: Diagnosis not present

## 2016-04-19 DIAGNOSIS — S0502XD Injury of conjunctiva and corneal abrasion without foreign body, left eye, subsequent encounter: Secondary | ICD-10-CM | POA: Diagnosis not present

## 2016-04-19 DIAGNOSIS — H401123 Primary open-angle glaucoma, left eye, severe stage: Secondary | ICD-10-CM | POA: Diagnosis not present

## 2016-04-19 DIAGNOSIS — H524 Presbyopia: Secondary | ICD-10-CM | POA: Diagnosis not present

## 2016-04-19 DIAGNOSIS — H52221 Regular astigmatism, right eye: Secondary | ICD-10-CM | POA: Diagnosis not present

## 2016-04-19 DIAGNOSIS — H35363 Drusen (degenerative) of macula, bilateral: Secondary | ICD-10-CM | POA: Diagnosis not present

## 2016-04-19 DIAGNOSIS — H353132 Nonexudative age-related macular degeneration, bilateral, intermediate dry stage: Secondary | ICD-10-CM | POA: Diagnosis not present

## 2016-04-19 DIAGNOSIS — H5211 Myopia, right eye: Secondary | ICD-10-CM | POA: Diagnosis not present

## 2016-04-20 ENCOUNTER — Telehealth: Payer: Self-pay

## 2016-04-20 DIAGNOSIS — H903 Sensorineural hearing loss, bilateral: Secondary | ICD-10-CM | POA: Diagnosis not present

## 2016-04-20 DIAGNOSIS — H6123 Impacted cerumen, bilateral: Secondary | ICD-10-CM | POA: Diagnosis not present

## 2016-04-20 NOTE — Telephone Encounter (Signed)
----- Message from Einar Pheasant, MD sent at 04/20/2016  1:41 PM EDT ----- Contact: [phone] See if she can come in tomorrow at 1:30.  Thanks  This needs to be documented in her chart that we are offering her appts (instead of staff message).   Thanks    Dr Nicki Reaper ----- Message ----- From: Izora Ribas Tyrhonda Georgiades, RN Sent: 04/20/2016   9:33 AM To: Einar Pheasant, MD  Patient called today, is not able to come on Friday now, would like to keep October the 13th appt.  She was very confused on why she needed to see ENT, she doesn't want to go to them either.  Please advise, Caryl Pina is cancelling the Friday appt as she is not coming.  Thanks ----- Message ----- From: Einar Pheasant, MD Sent: 04/18/2016   4:54 PM To: Izora Ribas Falisa Lamora, RN  Sense she was unable to come in tomorrow  --- I reviewed ER records.  She declined admission for further w/up.  They had recommended ENT f/u in two days.  Is she agreeable for f/u with them and then see me at the 12:00 appt on Friday.  Unable to work in during lunch - tues and Wednesday - meetings.    Dr Nicki Reaper ----- Message ----- From: Izora Ribas Eilam [place], RN Sent: 04/18/2016   4:45 PM To: Einar Pheasant, MD  The other patient is going to keep the appt, thanks ----- Message ----- From: Einar Pheasant, MD Sent: 04/18/2016   4:14 PM To: Izora Ribas Brettany Sydney, RN  Please call and let her know that the appt is at 4:00.  This time may not interfere with her other appts.  Also given meetings this week, this will be the time I can work her in.  Can see if cancellation or Friday appt.  .  I want her to keep her other appts tomorrow.    Dr Nicki Reaper ----- Message ----- From: Izora Ribas Yusra Ravert, RN Sent: 04/18/2016   3:51 PM To: Einar Pheasant, MD  Patient is unable to come tomorrow, per [facility] as she has other appts already scheduled. thanks ----- Message ----- From: Einar Pheasant, MD Sent: 04/18/2016   3:44 PM To: Izora Ribas Morgen Ritacco, RN  If the 4:00 pt does not come in tomorrow, then can she be  put in that spot.  Let me know either way.    Dr Nicki Reaper ----- Message ----- From: Izora Ribas Laticia Vannostrand, RN Sent: 04/18/2016   2:48 PM To: Einar Pheasant, MD  I wanted to make sure that you have received this message, patient was seen in the ED for Vertigo, has a follow up with you on Friday and has had her neighbors all of them call and state there opinion that we are not tending to the importance of her follow up and that waiting till Friday is the only option.  Caryl Pina states that she had spoken with the patient prior to today and that you had okayed the appt on Friday.   I just wanted to make sure that you were aware of the excessive number of calls the front has received today from her neighbors and that she is obivously not happy with the outcome.  Please advise if I can be of assistance with this.  Thanks Lavella Lemons ----- Message ----- From: Eddie North Chrismon Sent: 04/18/2016   2:17 PM To: Izora Ribas Everlie Eble, RN  Pt has had five people call and try to get a sooner appt for pt.. pt has stated that nobody has called  her I talked to her earlier about this appt.. Pt doesn't think that she was diagnosed correctly at the ED with Vertigo

## 2016-04-20 NOTE — Telephone Encounter (Signed)
Left a VM to have patient return my call regarding an appt tomorrow, thanks

## 2016-04-21 ENCOUNTER — Ambulatory Visit (INDEPENDENT_AMBULATORY_CARE_PROVIDER_SITE_OTHER): Payer: Medicare Other | Admitting: Internal Medicine

## 2016-04-21 ENCOUNTER — Encounter: Payer: Self-pay | Admitting: Internal Medicine

## 2016-04-21 DIAGNOSIS — R42 Dizziness and giddiness: Secondary | ICD-10-CM

## 2016-04-21 DIAGNOSIS — K2100 Gastro-esophageal reflux disease with esophagitis, without bleeding: Secondary | ICD-10-CM

## 2016-04-21 DIAGNOSIS — E7800 Pure hypercholesterolemia, unspecified: Secondary | ICD-10-CM

## 2016-04-21 DIAGNOSIS — H409 Unspecified glaucoma: Secondary | ICD-10-CM

## 2016-04-21 DIAGNOSIS — K2270 Barrett's esophagus without dysplasia: Secondary | ICD-10-CM

## 2016-04-21 NOTE — Progress Notes (Signed)
Pre visit review using our clinic review tool, if applicable. No additional management support is needed unless otherwise documented below in the visit note. 

## 2016-04-21 NOTE — Progress Notes (Signed)
Patient ID: Christy Cisneros, female   DOB: [birth year], 82 y.o.   MRN: [id number]   Subjective:    Patient ID: Christy Cisneros, female    DOB: May 05, 1920, 82 y.o.   MRN: [id number]  HPI  Patient here for an ER follow up.  She was seen in the ER 04/15/16 with dizziness.  She was diagnosed with vertigo.  CT negative for any acute problems.  She was discharged home with instructions to take meclizine and f/u with ENT.  She comes in today accompanied by a friend.  History obtained from both of them.  Since her ER visit, she has not had any further bad episodes of dizziness like she experienced that day.  Her friend is concerned about her memory and some increased confusion  She is able to answer questions.  No headache.  No dizziness.  No chest pain.  States she is eating.  Has not started walking her usual route.  Plans to work back up gradually.  Bowels stable.     Past Medical History:  Diagnosis Date  . Barrett's esophagus with esophagitis   . Chronic bronchitis ([place])   . GERD (gastroesophageal reflux disease)   . Glaucoma   . Hypercholesteremia   . Osteoarthritis   . Osteoporosis   . Sleep apnea    has CPAP  . TIA (transient ischemic attack)    Past Surgical History:  Procedure Laterality Date  . BUNIONECTOMY     left  . ROTATOR CUFF REPAIR     right   Family History  Problem Relation Age of Onset  . Breast cancer Sister   . Heart disease Mother    Social History   Social History  . Marital status: Single    Spouse name: N/A  . Number of children: N/A  . Years of education: N/A   Social History Main Topics  . Smoking status: Never Smoker  . Smokeless tobacco: Never Used  . Alcohol use 0.0 oz/week     Comment: wine occasional  . Drug use: No  . Sexual activity: Not Asked   Other Topics Concern  . None   Social History Narrative  . None    Outpatient Encounter Prescriptions as of 04/21/2016  Medication Sig  . Aloe LIQD Take by mouth daily.   Marland Kitchen aspirin 81  MG tablet Take 81 mg by mouth daily.  . Bilberry, Vaccinium myrtillus, (BILBERRY PO) Take 1 capsule by mouth daily.  . calcium carbonate (OS-CAL) 600 MG TABS Take 600 mg by mouth daily.  . dorzolamide-timolol (COSOPT) 22.3-6.8 MG/ML ophthalmic solution Apply to eye. Reported on 02/11/2016  . erythromycin ophthalmic ointment Reported on 02/11/2016  . Flaxseed, Linseed, (GROUND FLAX SEEDS PO) Take by mouth daily.   Marland Kitchen latanoprost (XALATAN) 0.005 % ophthalmic solution Reported on 02/11/2016  . meclizine (ANTIVERT) 25 MG tablet Take 0.5 tablets (12.5 mg total) by mouth 3 (three) times daily as needed.  . Multiple Vitamins-Minerals (PRESERVISION AREDS 2 PO) Take by mouth 2 (two) times daily.  Marland Kitchen omeprazole (PRILOSEC) 20 MG capsule   . prednisoLONE acetate (PRED FORTE) 1 % ophthalmic suspension Place 1 drop into the left eye 2 (two) times daily.  . timolol (BETIMOL) 0.5 % ophthalmic solution Place 1 drop into the right eye daily.   No facility-administered encounter medications on file as of 04/21/2016.     Review of Systems  Constitutional: Negative for appetite change.       She is eating and drinking well.  HENT: Negative for congestion and sinus pressure.   Respiratory: Negative for cough, chest tightness and shortness of breath.   Cardiovascular: Negative for chest pain, palpitations and leg swelling.  Gastrointestinal: Negative for abdominal pain, diarrhea, nausea and vomiting.  Genitourinary: Negative for difficulty urinating and dysuria.  Musculoskeletal: Negative for joint swelling and myalgias.  Skin: Negative for color change and rash.  Neurological: Positive for dizziness. Negative for headaches.  Psychiatric/Behavioral: Negative for agitation and dysphoric mood.       Objective:    Physical Exam  Constitutional: She appears well-developed and well-nourished. No distress.  HENT:  Nose: Nose normal.  Mouth/Throat: Oropharynx is clear and moist.  Neck: Neck supple. No  thyromegaly present.  Cardiovascular: Normal rate and regular rhythm.   Pulmonary/Chest: Breath sounds normal. No respiratory distress. She has no wheezes.  Abdominal: Soft. Bowel sounds are normal. There is no tenderness.  Musculoskeletal: She exhibits no edema or tenderness.  Lymphadenopathy:    She has no cervical adenopathy.  Skin: No rash noted. No erythema.  Psychiatric: She has a normal mood and affect. Her behavior is normal.    BP 132/80   Pulse 89   Temp 97.5 F (36.4 C) (Oral)   Wt 100 lb 9.6 oz (45.6 kg)   SpO2 95%   BMI 19.01 kg/m  Wt Readings from Last 3 Encounters:  04/21/16 100 lb 9.6 oz (45.6 kg)  04/15/16 102 lb (46.3 kg)  02/11/16 104 lb 2 oz (47.2 kg)     Lab Results  Component Value Date   WBC 6.8 04/15/2016   HGB 13.8 04/15/2016   HCT 41.3 04/15/2016   PLT 264 04/15/2016   GLUCOSE 114 (H) 04/15/2016   CHOL 220 (H) 06/16/2015   TRIG 64.0 06/16/2015   HDL 82.20 06/16/2015   LDLCALC 125 (H) 06/16/2015   ALT 13 11/12/2015   AST 19 11/12/2015   NA 139 04/15/2016   K 3.4 (L) 04/15/2016   CL 104 04/15/2016   CREATININE 0.79 04/15/2016   BUN 20 04/15/2016   CO2 28 04/15/2016   TSH 1.13 06/16/2015    Ct Head Wo Contrast  Result Date: 04/15/2016 CLINICAL DATA:  82 year old female with dizziness EXAM: CT HEAD WITHOUT CONTRAST TECHNIQUE: Contiguous axial images were obtained from the base of the skull through the vertex without intravenous contrast. COMPARISON:  Prior head CT 06/16/2015 FINDINGS: Brain: No evidence of acute infarction, hemorrhage, hydrocephalus, extra-axial collection or mass lesion/mass effect. Mild cerebral cortical atrophy. Extensive confluent periventricular white matter hypoattenuation remains unchanged and most consistent with chronic microvascular ischemic white matter disease. Stable small lacunar infarct in the left cerebellar hemisphere. Vascular: No hyperdense vessel or unexpected calcification. Skull: Normal. Negative for  fracture or focal lesion. Sinuses/Orbits: No acute finding. Postsurgical changes involving the left globe again noted. Other: None. IMPRESSION: 1. No acute intracranial abnormality. 2. Stable appearance of relatively mild atrophy and advanced chronic microvascular ischemic white matter disease. Electronically Signed   By: Jacqulynn Cadet M.D.   On: 04/15/2016 13:24       Assessment & Plan:   Problem List Items Addressed This Visit    BARRETTS ESOPHAGUS    On omeprazole.  Was evaluated by GI.  Has declined f/u EGD.       Dizziness    Had the episode of dizziness as outlined.  Diagnosed with vertigo.  Saw Dr Tami Ribas.  Obtain records.  Has noticed some memory change and some confusion.  Still lives by herself.  Fixes her food.  Able to do her ADLs.  Had CT.  Unrevealing.  Discussed MRI.  Discussed neurology referral.  Agrees to neurology referral for further evaluation.       Relevant Orders   Ambulatory referral to Neurology   GERD (gastroesophageal reflux disease)    Upper symptoms controlled on current regimen.  No problems reported today.  Follow.       Glaucoma    Followed by opthalmology.        HYPERCHOLESTEROLEMIA    Follow lipid panel.         Other Visit Diagnoses   None.      Einar Pheasant, MD

## 2016-04-22 ENCOUNTER — Ambulatory Visit: Payer: Medicare Other | Admitting: Internal Medicine

## 2016-04-24 ENCOUNTER — Encounter: Payer: Self-pay | Admitting: Internal Medicine

## 2016-04-24 NOTE — Assessment & Plan Note (Signed)
Follow lipid panel.   

## 2016-04-24 NOTE — Assessment & Plan Note (Signed)
Upper symptoms controlled on current regimen.  No problems reported today.  Follow.

## 2016-04-24 NOTE — Assessment & Plan Note (Signed)
Followed by opthalmology.       

## 2016-04-24 NOTE — Assessment & Plan Note (Signed)
Had the episode of dizziness as outlined.  Diagnosed with vertigo.  Saw Dr Tami Ribas.  Obtain records.  Has noticed some memory change and some confusion.  Still lives by herself.  Fixes her food.  Able to do her ADLs.  Had CT.  Unrevealing.  Discussed MRI.  Discussed neurology referral.  Agrees to neurology referral for further evaluation.

## 2016-04-24 NOTE — Assessment & Plan Note (Signed)
On omeprazole.  Was evaluated by GI.  Has declined f/u EGD.

## 2016-05-10 DIAGNOSIS — R413 Other amnesia: Secondary | ICD-10-CM | POA: Diagnosis not present

## 2016-05-10 DIAGNOSIS — G3184 Mild cognitive impairment, so stated: Secondary | ICD-10-CM | POA: Diagnosis not present

## 2016-05-12 DIAGNOSIS — R42 Dizziness and giddiness: Secondary | ICD-10-CM | POA: Diagnosis not present

## 2016-05-13 ENCOUNTER — Encounter: Payer: Self-pay | Admitting: Internal Medicine

## 2016-05-13 ENCOUNTER — Ambulatory Visit (INDEPENDENT_AMBULATORY_CARE_PROVIDER_SITE_OTHER): Payer: Medicare Other | Admitting: Internal Medicine

## 2016-05-13 DIAGNOSIS — K2100 Gastro-esophageal reflux disease with esophagitis, without bleeding: Secondary | ICD-10-CM

## 2016-05-13 DIAGNOSIS — K2270 Barrett's esophagus without dysplasia: Secondary | ICD-10-CM

## 2016-05-13 DIAGNOSIS — E7800 Pure hypercholesterolemia, unspecified: Secondary | ICD-10-CM

## 2016-05-13 DIAGNOSIS — R42 Dizziness and giddiness: Secondary | ICD-10-CM

## 2016-05-13 DIAGNOSIS — G3184 Mild cognitive impairment, so stated: Secondary | ICD-10-CM

## 2016-05-13 NOTE — Progress Notes (Signed)
Pre visit review using our clinic review tool, if applicable. No additional management support is needed unless otherwise documented below in the visit note. 

## 2016-05-13 NOTE — Progress Notes (Signed)
Patient ID: Christy Cisneros, female   DOB: 1920/07/03, 82 y.o.   MRN: [id number]   Subjective:    Patient ID: Christy Cisneros, female    DOB: [birth year], 82 y.o.   MRN: [id number]  HPI  Patient here for a scheduled follow up.  She was just evaluated by neurology for memory issues.  Felt to have mild cognitive impairment.  No changes made.  She is doing better.  Energy is better.  No further dizziness like she experienced previously.  She is back walking.  No chest pain.  Breathing stable.  Eating.  No nausea or vomiting.  Bowels stable.  Overall feels better.    Past Medical History:  Diagnosis Date  . Barrett's esophagus with esophagitis   . Chronic bronchitis ([place])   . GERD (gastroesophageal reflux disease)   . Glaucoma   . Hypercholesteremia   . Osteoarthritis   . Osteoporosis   . Sleep apnea    has CPAP  . TIA (transient ischemic attack)    Past Surgical History:  Procedure Laterality Date  . BUNIONECTOMY     left  . ROTATOR CUFF REPAIR     right   Family History  Problem Relation Age of Onset  . Breast cancer Sister   . Heart disease Mother    Social History   Social History  . Marital status: Single    Spouse name: N/A  . Number of children: N/A  . Years of education: N/A   Social History Main Topics  . Smoking status: Never Smoker  . Smokeless tobacco: Never Used  . Alcohol use 0.0 oz/week     Comment: wine occasional  . Drug use: No  . Sexual activity: Not Asked   Other Topics Concern  . None   Social History Narrative  . None    Outpatient Encounter Prescriptions as of 05/13/2016  Medication Sig  . Aloe LIQD Take by mouth daily.   Marland Kitchen aspirin 81 MG tablet Take 81 mg by mouth daily.  . Bilberry, Vaccinium myrtillus, (BILBERRY PO) Take 1 capsule by mouth daily.  . calcium carbonate (OS-CAL) 600 MG TABS Take 600 mg by mouth daily.  . Flaxseed, Linseed, (GROUND FLAX SEEDS PO) Take by mouth daily.   . meclizine (ANTIVERT) 25 MG tablet Take 0.5  tablets (12.5 mg total) by mouth 3 (three) times daily as needed.  . Multiple Vitamins-Minerals (PRESERVISION AREDS 2 PO) Take by mouth 2 (two) times daily.  Marland Kitchen omeprazole (PRILOSEC) 20 MG capsule   . prednisoLONE acetate (PRED FORTE) 1 % ophthalmic suspension Place 1 drop into the left eye 2 (two) times daily.  . timolol (BETIMOL) 0.5 % ophthalmic solution Place 1 drop into the right eye daily.  . dorzolamide-timolol (COSOPT) 22.3-6.8 MG/ML ophthalmic solution Apply to eye. Reported on 02/11/2016  . erythromycin ophthalmic ointment Reported on 02/11/2016  . latanoprost (XALATAN) 0.005 % ophthalmic solution Reported on 02/11/2016   No facility-administered encounter medications on file as of 05/13/2016.     Review of Systems  Constitutional: Negative for appetite change and unexpected weight change.  HENT: Negative for congestion and sinus pressure.   Respiratory: Negative for cough, chest tightness and shortness of breath.   Cardiovascular: Negative for chest pain, palpitations and leg swelling.  Gastrointestinal: Negative for abdominal pain, diarrhea, nausea and vomiting.  Genitourinary: Negative for difficulty urinating and dysuria.  Musculoskeletal: Negative for back pain and joint swelling.  Skin: Negative for color change and rash.  Neurological: Negative for dizziness, light-headedness  and headaches.  Psychiatric/Behavioral: Negative for agitation and dysphoric mood.       Objective:    Physical Exam  Constitutional: She appears well-developed and well-nourished. No distress.  HENT:  Nose: Nose normal.  Mouth/Throat: Oropharynx is clear and moist.  Neck: Neck supple. No thyromegaly present.  Cardiovascular: Normal rate and regular rhythm.   Pulmonary/Chest: Breath sounds normal. No respiratory distress. She has no wheezes.  Abdominal: Soft. Bowel sounds are normal. There is no tenderness.  Musculoskeletal: She exhibits no edema or tenderness.  Lymphadenopathy:    She has no  cervical adenopathy.  Skin: No rash noted. No erythema.  Psychiatric: She has a normal mood and affect. Her behavior is normal.    BP (!) 142/80   Pulse 89   Temp 98.1 F (36.7 C) (Oral)   Ht 5\' 1"  (1.549 m)   Wt 101 lb (45.8 kg)   SpO2 92%   BMI 19.08 kg/m  Wt Readings from Last 3 Encounters:  05/13/16 101 lb (45.8 kg)  04/21/16 100 lb 9.6 oz (45.6 kg)  04/15/16 102 lb (46.3 kg)     Lab Results  Component Value Date   WBC 6.8 04/15/2016   HGB 13.8 04/15/2016   HCT 41.3 04/15/2016   PLT 264 04/15/2016   GLUCOSE 114 (H) 04/15/2016   CHOL 220 (H) 06/16/2015   TRIG 64.0 06/16/2015   HDL 82.20 06/16/2015   LDLCALC 125 (H) 06/16/2015   ALT 13 11/12/2015   AST 19 11/12/2015   NA 139 04/15/2016   K 3.4 (L) 04/15/2016   CL 104 04/15/2016   CREATININE 0.79 04/15/2016   BUN 20 04/15/2016   CO2 28 04/15/2016   TSH 1.13 06/16/2015    Ct Head Wo Contrast  Result Date: 04/15/2016 CLINICAL DATA:  82 year old female with dizziness EXAM: CT HEAD WITHOUT CONTRAST TECHNIQUE: Contiguous axial images were obtained from the base of the skull through the vertex without intravenous contrast. COMPARISON:  Prior head CT 06/16/2015 FINDINGS: Brain: No evidence of acute infarction, hemorrhage, hydrocephalus, extra-axial collection or mass lesion/mass effect. Mild cerebral cortical atrophy. Extensive confluent periventricular white matter hypoattenuation remains unchanged and most consistent with chronic microvascular ischemic white matter disease. Stable small lacunar infarct in the left cerebellar hemisphere. Vascular: No hyperdense vessel or unexpected calcification. Skull: Normal. Negative for fracture or focal lesion. Sinuses/Orbits: No acute finding. Postsurgical changes involving the left globe again noted. Other: None. IMPRESSION: 1. No acute intracranial abnormality. 2. Stable appearance of relatively mild atrophy and advanced chronic microvascular ischemic white matter disease.  Electronically Signed   By: Jacqulynn Cadet M.D.   On: 04/15/2016 13:24       Assessment & Plan:   Problem List Items Addressed This Visit    BARRETTS ESOPHAGUS    On omeprazole.  She declines further evaluation.        Dizziness    Dizziness has not reoccurred. She saw neurology. No changes made.  Follow.       GERD (gastroesophageal reflux disease)    Upper symptoms controlled on current regimen.        HYPERCHOLESTEROLEMIA    Follow lipid panel.       Mild cognitive impairment    Saw neurology.  Note reviewed. No changes made.        Other Visit Diagnoses   None.      Einar Pheasant, MD

## 2016-05-15 ENCOUNTER — Encounter: Payer: Self-pay | Admitting: Internal Medicine

## 2016-05-15 DIAGNOSIS — G3184 Mild cognitive impairment, so stated: Secondary | ICD-10-CM | POA: Insufficient documentation

## 2016-05-15 NOTE — Assessment & Plan Note (Signed)
On omeprazole.  She declines further evaluation.

## 2016-05-15 NOTE — Assessment & Plan Note (Signed)
Upper symptoms controlled on current regimen.

## 2016-05-15 NOTE — Assessment & Plan Note (Signed)
Follow lipid panel.   

## 2016-05-15 NOTE — Assessment & Plan Note (Signed)
Saw neurology.  Note reviewed. No changes made.

## 2016-05-15 NOTE — Assessment & Plan Note (Signed)
Dizziness has not reoccurred. She saw neurology. No changes made.  Follow.

## 2016-05-31 ENCOUNTER — Telehealth: Payer: Self-pay | Admitting: Internal Medicine

## 2016-05-31 NOTE — Telephone Encounter (Signed)
I called pt and left a vm to call office to sch AWV. Thank you! °

## 2016-06-03 ENCOUNTER — Ambulatory Visit: Payer: Medicare Other | Attending: Unknown Physician Specialty

## 2016-06-03 ENCOUNTER — Encounter: Payer: Self-pay | Admitting: Physical Therapy

## 2016-06-03 VITALS — BP 121/97 | HR 83

## 2016-06-03 DIAGNOSIS — R29898 Other symptoms and signs involving the musculoskeletal system: Secondary | ICD-10-CM | POA: Diagnosis not present

## 2016-06-03 DIAGNOSIS — R2681 Unsteadiness on feet: Secondary | ICD-10-CM

## 2016-06-03 DIAGNOSIS — M6281 Muscle weakness (generalized): Secondary | ICD-10-CM | POA: Insufficient documentation

## 2016-06-03 NOTE — Therapy (Signed)
[place] MAIN [facility] SERVICES [address] [place], [place], [address] Phone: [phone]   Fax:  [phone]  Physical Therapy Evaluation  Patient Details  Name: Christy Cisneros MRN: [id number] Date of Birth: 1977-02-24 Referring Provider: Beverly Gust  Encounter Date: 06/03/2016      PT End of Session - 06/03/16 1039    Visit Number 1   Number of Visits 17   Date for PT Re-Evaluation 11-Aug-2016   Authorization Type g codes 1/10   PT Start Time 1000   PT Stop Time 1045   PT Time Calculation (min) 45 min   Equipment Utilized During Treatment Gait belt   Activity Tolerance Patient tolerated treatment well   Behavior During Therapy WFL for tasks assessed/performed      Past Medical History:  Diagnosis Date  . Barrett's esophagus with esophagitis   . Chronic bronchitis ([employer])   . GERD (gastroesophageal reflux disease)   . Glaucoma   . Hypercholesteremia   . Osteoarthritis   . Osteoporosis   . Sleep apnea    has CPAP  . TIA (transient ischemic attack)     Past Surgical History:  Procedure Laterality Date  . BUNIONECTOMY     left  . ROTATOR CUFF REPAIR     right    Vitals:   06/03/16 1024  BP: (!) 121/97  Pulse: 83  SpO2: 99%         Subjective Assessment - 06/03/16 1003    Subjective Difficulty with balance.    Pertinent History Pt reports that she has been having trouble with her balance for an extended period of time. She staggers initially when she stands up and is wanting to improve this. She had a recent bout of vertigo where she had to come to [facility] ED on 04/15/16. Pt states that the symptoms occurred spontaneously while she was standing talking with a friend. She was discharged from the hospital with a diagnosis of vertigo and a prescription for meclizine. She is not currently taking the meclizine and has had  no further bouts of vertigo. Pt currently denies dizziness. She states that she was sent to neurology  and ENT and then referred to [facility] for vestibular therapy. ENT notes reviewed and state pt symptoms only occurred once but are better now. Pt is somewhat a difficulty historian and no family present to supplement. Friend is present but has no additional information as she is not involved with the patient's healthcare. Pt denies any other recent changes in her health.    Limitations Walking;Standing;House hold activities   Patient Stated Goals Pt would like to improve her balance   Currently in Pain? No/denies            [facility] PT Assessment - 06/03/16 0001      Assessment   Medical Diagnosis Dizziness   Referring Provider Beverly Gust   Hand Dominance Right   Next MD Visit None reported   Prior Therapy Yes     Precautions   Precautions Fall     Restrictions   Weight Bearing Restrictions No     Balance Screen   Has the patient fallen in the past 6 months No   Has the patient had a decrease in activity level because of a fear of falling?  No   Is the patient reluctant to leave their home because of a fear of falling?  No     Home Ecologist residence  Living Arrangements Alone   Available Help at Discharge Friend(s)   Type of [place] to enter   Entrance Stairs-Number of Steps 3   Entrance Stairs-Rails Right   Home Layout One level   [place] - single point;Tub bench;Grab bars - tub/shower     Prior Function   Level of Independence Independent   Vocation Retired   Leisure walking for exercise, reading      Cognition   Overall Cognitive Status Within Functional Limits for tasks assessed     Observation/Other Assessments   Other Surveys  Other Surveys   Activities of Balance Confidence Scale (ABC Scale)  29.69%   Dizziness Handicap Inventory (DHI)  48/100     ROM / Strength   AROM / PROM / Strength Strength     Strength   Overall Strength Comments Gross strength assessment reveals at least 4  to 4+/5 throughout UE/LE. Very functional given patient age     Standardized Balance Assessment   Standardized Balance Assessment Dynamic Gait Index;Berg Balance Test;Timed Up and Go Test;Five Times Sit to Stand;10 meter walk test   Five times sit to stand comments  15.1 seconds   10 Meter Walk 9.2 = 1.08 m/s     Berg Balance Test   Sit to Stand Able to stand without using hands and stabilize independently   Standing Unsupported Able to stand safely 2 minutes   Sitting with Back Unsupported but Feet Supported on Floor or Stool Able to sit safely and securely 2 minutes   Stand to Sit Sits safely with minimal use of hands   Transfers Able to transfer safely, minor use of hands   Standing Unsupported with Eyes Closed Able to stand 10 seconds safely   Standing Ubsupported with Feet Together Able to place feet together independently and stand 1 minute safely   From Standing, Reach Forward with Outstretched Arm Can reach forward >12 cm safely (5")   From Standing Position, Pick up Object from Floor Able to pick up shoe safely and easily   From Standing Position, Turn to Look Behind Over each Shoulder Looks behind from both sides and weight shifts well   Turn 360 Degrees Able to turn 360 degrees safely but slowly   Standing Unsupported, Alternately Place Feet on Step/Stool Able to complete >2 steps/needs minimal assist   Standing Unsupported, One Foot in Front Able to take small step independently and hold 30 seconds   Standing on One Leg Tries to lift leg/unable to hold 3 seconds but remains standing independently   Total Score 45     Dynamic Gait Index   Level Surface Normal   Change in Gait Speed Normal   Gait with Horizontal Head Turns Moderate Impairment   Gait with Vertical Head Turns Moderate Impairment   Gait and Pivot Turn Moderate Impairment   Step Over Obstacle Moderate Impairment   Step Around Obstacles Normal   Steps Moderate Impairment   Total Score 14     Timed Up and Go  Test   TUG Normal TUG   Normal TUG (seconds) 14.2                         PT Education - 06/03/16 1025    Education provided Yes   Education Details Plan of care   Person(s) Educated Patient   Methods Explanation   Comprehension Verbalized understanding  PT Long Term Goals - 06/03/16 1222      PT LONG TERM GOAL #1   Title  Pt will be independent with HEP in order to improve strength and balance in order to decrease fall risk and improve function at home and work.    Time 8   Period Weeks   Status New     PT LONG TERM GOAL #2   Title  Pt will improve ABC by at least 13% in order to demonstrate clinically significant improvement in balance confidence.    Baseline 06/03/16: 29.69%   Time 6   Period Weeks   Status New     PT LONG TERM GOAL #3   Title Pt will improve DGI by at least 3 points in order to demonstrate clinically significant improvement in balance and decreased risk for falls    Baseline 06/03/16: 14/24   Time 8   Period Weeks   Status New               Plan - 06/03/16 1213    Clinical Impression Statement Pt is a pleasant 82 yo female referred for vestibular physical therapy secondary to dizziness. Unfortunately pt arrived late for her appointment so evaluation is somewhat abbreviated. She is a difficult historian at times getting confused with her dates and times as well as multiple medical appointments. At this time it appears that pt had one episode of vertigo but has not had any further recurrence. ENT notes states that Dix-Hallpike test was negative and pt does nto complain of any further vertigo or dizziness since the initial episode. Pt reports that she is at therapy to work on her balance. Pt scored 29.69% on ABC indicating very low balance confidence. She presents with impairments in her balance with BERG of 45/56 and DGI of 14/24. Encouraged pt to use a cane or walker at home but she refuses at this time. Pt has severe  unsteadiness when performing head turns during ambulation. Her gait speed, 5TSTS, and TUG scores are all WNL for age. Will perform further vestibular screening at next appointment and then progress balance exercises and issue a home program. Pt will benefit from skilled PT services to address deficits in balance in order to reduce risk for falls and improve function at home and out in the community.    Rehab Potential Good   Clinical Impairments Affecting Rehab Potential (+) active, motivated  (-) age   PT Frequency 2x / week   PT Duration 8 weeks   PT Treatment/Interventions Patient/family education;Therapeutic exercise;Balance training;Aquatic Therapy;Canalith Repostioning;Electrical Stimulation;Iontophoresis 4mg /ml Dexamethasone;Moist Heat;Traction;Ultrasound;DME Instruction;Gait training;Therapeutic activities;Neuromuscular re-education;Cognitive remediation;Manual techniques;Passive range of motion;Vestibular   PT Next Visit Plan Vestibular screen, mCTSIB, progress balance exercises and initiate HEP   PT Home Exercise Plan None currently, to be issued at next session.    Consulted and Agree with Plan of Care Patient      Patient will benefit from skilled therapeutic intervention in order to improve the following deficits and impairments:  Decreased balance, Decreased safety awareness  Visit Diagnosis: Unsteadiness on feet - Plan: PT plan of care cert/re-cert  Muscle weakness (generalized) - Plan: PT plan of care cert/re-cert      G-Codes - [id number]    Functional Assessment Tool Used clinical judgement, DGI, BERG, 5TSTS, 10MWT, TUG   Functional Limitation Mobility: Walking and moving around   Mobility: Walking and Moving Around Current Status [id number]) At least 20 percent but less than 40 percent impaired, limited or restricted  Mobility: Walking and Moving Around Goal Status [phone]) At least 1 percent but less than 20 percent impaired, limited or restricted       Problem  List Patient Active Problem List   Diagnosis Date Noted  . Mild cognitive impairment 05/15/2016  . Dizziness 04/21/2016  . Neck pain 02/11/2016  . Muscle cramps 11/14/2015  . Urine incontinence 08/16/2015  . Hematoma 08/16/2015  . Fatigue 07/12/2015  . Headache 06/16/2015  . Unsteady gait 06/01/2015  . GERD (gastroesophageal reflux disease) 08/11/2014  . UTI (urinary tract infection) 08/07/2014  . DYSPNEA 04/17/2009  . HYPERCHOLESTEROLEMIA 04/16/2009  . Obstructive sleep apnea 04/16/2009  . Glaucoma 04/16/2009  . Transient cerebral ischemia 04/16/2009  . BARRETTS ESOPHAGUS 04/16/2009  . OSTEOARTHRITIS 04/16/2009  . Osteoporosis 04/16/2009   Phillips Grout PT, DPT   Huprich,Jason 06/03/2016, 12:26 PM  [place] MAIN [facility] SERVICES [address] [place], [place], [address] Phone: [phone]   Fax:  [phone]  Name: ALYSSANDRA GEORGER MRN: [id number] Date of Birth: 1919-10-30

## 2016-06-07 ENCOUNTER — Ambulatory Visit: Payer: Medicare Other

## 2016-06-07 VITALS — BP 124/75 | HR 83

## 2016-06-07 DIAGNOSIS — M6281 Muscle weakness (generalized): Secondary | ICD-10-CM | POA: Diagnosis not present

## 2016-06-07 DIAGNOSIS — R2681 Unsteadiness on feet: Secondary | ICD-10-CM

## 2016-06-07 DIAGNOSIS — R29898 Other symptoms and signs involving the musculoskeletal system: Secondary | ICD-10-CM | POA: Diagnosis not present

## 2016-06-07 NOTE — Therapy (Signed)
[place] MAIN [facility] SERVICES [address] [place], [place], [address] Phone: [phone]   Fax:  [phone]  Physical Therapy Treatment  Patient Details  Name: Christy Cisneros MRN: [id number] Date of Birth: 1920/02/10 Referring Provider: Beverly Gust  Encounter Date: 06/07/2016      PT End of Session - 06/07/16 1706    Visit Number 2   Number of Visits 17   Date for PT Re-Evaluation 2016/08/20   Authorization Type g codes 02-Oct-2022   PT Start Time 1400   PT Stop Time 1445   PT Time Calculation (min) 45 min   Equipment Utilized During Treatment Gait belt   Activity Tolerance Patient tolerated treatment well   Behavior During Therapy WFL for tasks assessed/performed      Past Medical History:  Diagnosis Date  . Barrett's esophagus with esophagitis   . Chronic bronchitis ([place])   . GERD (gastroesophageal reflux disease)   . Glaucoma   . Hypercholesteremia   . Osteoarthritis   . Osteoporosis   . Sleep apnea    has CPAP  . TIA (transient ischemic attack)     Past Surgical History:  Procedure Laterality Date  . BUNIONECTOMY     left  . ROTATOR CUFF REPAIR     right    Vitals:   06/07/16 1404  BP: 124/75  Pulse: 83  SpO2: 100%        Subjective Assessment - 06/07/16 1405    Subjective Pt reports she is doing well at this time. Denies pain. No specific questions or concerns at this time.    Pertinent History Pt reports that she has been having trouble with her balance for an extended period of time. She staggers initially when she stands up and is wanting to improve this. She had a recent bout of vertigo where she had to come to [facility] ED on 04/15/16. Pt states that the symptoms occurred spontaneously while she was standing talking with a friend. She was discharged from the hospital with a diagnosis of vertigo and a prescription for meclizine. She is not currently taking the meclizine and has had  no further bouts of vertigo.  Pt currently denies dizziness. She states that she was sent to neurology and ENT and then referred to [facility] for vestibular therapy. ENT notes reviewed and state pt symptoms only occurred once but are better now. Pt is somewhat a difficulty historian and no family present to supplement. Friend is present but has no additional information as she is not involved with the patient's healthcare. Pt denies any other recent changes in her health.    Limitations Walking;Standing;House hold activities   Patient Stated Goals Pt would like to improve her balance       MUSCULOSKELETAL SCREEN: Cervical Spine ROM: Limited cervical extension noted   Clinical Test of Sensory Interaction for Balance    (CTSIB):  CONDITION TIME STRATEGY SWAY  Eyes open, firm surface 30 seconds  1+  Eyes closed, firm surface 30 seconds  2+  Eyes open, foam surface 30 seconds  3+  Eyes closed, foam surface Approximately 6.5 seconds Knee/hip 4+    OCULOMOTOR / VESTIBULAR TESTING:  Oculomotor Exam- Room Light  Normal Abnormal Comments  Ocular Alignment  A Mild disconjugate gaze, 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  Ocular ROM N  Appears normal but difficult to assess due to L eye blindness  Spontaneous Nystagmus N    End-Gaze Nystagmus N    Smooth Pursuit  A Saccadic  Saccades N    VOR N    VOR Cancellation N    Left Head Thrust   Difficult to assess due to startle  Right Head Thrust   Difficult to assess due to startle  Head Shaking Nystagmus     Static Acuity     Dynamic Acuity       Oculomotor Exam- Fixation Suppressed  Normal Abnormal Comments  Ocular Alignment     Ocular ROM     Spontaneous Nystagmus     End-Gaze Nystagmus N    Left Head Thrust     Right Head Thrust     Head Shaking Nystagmus N      BPPV TESTS:  Symptoms Duration Intensity Nystagmus  L Dix-Hallpike None   None  R Dix-Hallpike None   None  L Head Roll None   None  R Head Roll None   None  L [place] Test      R [place]ing Test         NEUROMUSCULAR RE-EDUCATION Single leg balance practice x 30 seconds total in // bars; Heel/toe raises in // bars x 10 each direction; Semitandem progression with horizontal and vertical head turn alternating forward LE x 30 seconds each; Semitandem progression with horizontal head with body turn alternating forward LE x 30 seconds;                                         PT Education - 06/07/16 1704    Education provided Yes   Education Details HEP   Person(s) Educated Patient   Methods Explanation;Demonstration;Verbal cues;Handout   Comprehension Verbalized understanding;Returned demonstration             PT Long Term Goals - 06/03/16 1222      PT LONG TERM GOAL #1   Title  Pt will be independent with HEP in order to improve strength and balance in order to decrease fall risk and improve function at home and work.    Time 8   Period Weeks   Status New     PT LONG TERM GOAL #2   Title  Pt will improve ABC by at least 13% in order to demonstrate clinically significant improvement in balance confidence.    Baseline 06/03/16: 29.69%   Time 6   Period Weeks   Status New     PT LONG TERM GOAL #3   Title Pt will improve DGI by at least 3 points in order to demonstrate clinically significant improvement in balance and decreased risk for falls    Baseline 06/03/16: 14/24   Time 8   Period Weeks   Status New               Plan - 06/07/16 1706    Clinical Impression Statement No obvious vestibular deficits noted during examination. Negative testing for BPPV. Pt demonstrates difficulty with all head turning activities as well as difficulty with eyes closed balance on foam. She also demonstrates very poor ankle strategy for balance. Pt issued HEP and encouraged to follow-up as scheduled.    Rehab Potential Good   Clinical Impairments Affecting Rehab Potential (+) active, motivated  (-) age   PT Frequency 2x / week   PT Duration 8  weeks   PT Treatment/Interventions Patient/family education;Therapeutic exercise;Balance training;Aquatic Therapy;Canalith Repostioning;Electrical Stimulation;Iontophoresis 4mg /ml Dexamethasone;Moist Heat;Traction;Ultrasound;DME Instruction;Gait training;Therapeutic activities;Neuromuscular re-education;Cognitive remediation;Manual techniques;Passive range of  motion;Vestibular   PT Next Visit Plan Vestibular screen, mCTSIB, progress balance exercises and initiate HEP   PT Home Exercise Plan Heel/toe raises, single leg balance practice (slow marches), and semi-tandem balance with horizontal head turns;   Consulted and Agree with Plan of Care Patient      Patient will benefit from skilled therapeutic intervention in order to improve the following deficits and impairments:  Decreased balance, Decreased safety awareness  Visit Diagnosis: Unsteadiness on feet  Muscle weakness (generalized)     Problem List Patient Active Problem List   Diagnosis Date Noted  . Mild cognitive impairment 05/15/2016  . Dizziness 04/21/2016  . Neck pain 02/11/2016  . Muscle cramps 11/14/2015  . Urine incontinence 08/16/2015  . Hematoma 08/16/2015  . Fatigue 07/12/2015  . Headache 06/16/2015  . Unsteady gait 06/01/2015  . GERD (gastroesophageal reflux disease) 08/11/2014  . UTI (urinary tract infection) 08/07/2014  . DYSPNEA 04/17/2009  . HYPERCHOLESTEROLEMIA 04/16/2009  . Obstructive sleep apnea 04/16/2009  . Glaucoma 04/16/2009  . Transient cerebral ischemia 04/16/2009  . BARRETTS ESOPHAGUS 04/16/2009  . OSTEOARTHRITIS 04/16/2009  . Osteoporosis 04/16/2009   Phillips Grout PT, DPT   Huprich,Jason 06/07/2016, 5:18 PM  [place] MAIN [facility] SERVICES [address] [place], [place], [address] Phone: [phone]   Fax:  [phone]  Name: Christy Cisneros MRN: [id number] Date of Birth: 18-Jan-1920

## 2016-06-07 NOTE — Patient Instructions (Signed)
Feet Heel-Toe "Semi-Tandem", Head Motion - Eyes Open    With eyes open, right foot directly in front of the other but slightly out to the side, move head slowly: up and down. Perform for 30 seconds, alternate feet and then repeat. Repeat _3___ times per session on each leg. Do __2__ sessions per day.  Single Leg - Eyes Open    Holding support, lift right leg while maintaining balance over other leg. Progress to removing hands from support surface for longer periods of time. Hold__30__ seconds total. Repeat __3__ times per session on each leg. Do __2__ sessions per day.  Toe / Heel Raise    Gently rock back on heels and raise toes. Then rock forward on toes and raise heels. Repeat sequence for 30 seconds. Relax and repeat 2 more times for a total of 3 bouts. Do 2___ sessions per day.

## 2016-06-10 ENCOUNTER — Encounter: Payer: Self-pay | Admitting: Physical Therapy

## 2016-06-10 ENCOUNTER — Ambulatory Visit: Payer: Medicare Other

## 2016-06-10 VITALS — BP 140/60 | HR 87

## 2016-06-10 DIAGNOSIS — R29898 Other symptoms and signs involving the musculoskeletal system: Secondary | ICD-10-CM | POA: Diagnosis not present

## 2016-06-10 DIAGNOSIS — R2681 Unsteadiness on feet: Secondary | ICD-10-CM

## 2016-06-10 DIAGNOSIS — M6281 Muscle weakness (generalized): Secondary | ICD-10-CM | POA: Diagnosis not present

## 2016-06-10 NOTE — Therapy (Signed)
[place] MAIN [facility] SERVICES [address] [place], [place], [address] Phone: [phone]   Fax:  [phone]  Physical Therapy Treatment  Patient Details  Name: Christy Cisneros MRN: [id number] Date of Birth: 05/10/1920 Referring Provider: Beverly Gust  Encounter Date: 06/10/2016      PT End of Session - 06/10/16 0827    Visit Number 3   Number of Visits 17   Date for PT Re-Evaluation Aug 26, 2016   Authorization Type g codes 11/06/2022   PT Start Time 0830   PT Stop Time 0915   PT Time Calculation (min) 45 min   Equipment Utilized During Treatment Gait belt   Activity Tolerance Patient tolerated treatment well   Behavior During Therapy WFL for tasks assessed/performed      Past Medical History:  Diagnosis Date  . Barrett's esophagus with esophagitis   . Chronic bronchitis ([place])   . GERD (gastroesophageal reflux disease)   . Glaucoma   . Hypercholesteremia   . Osteoarthritis   . Osteoporosis   . Sleep apnea    has CPAP  . TIA (transient ischemic attack)     Past Surgical History:  Procedure Laterality Date  . BUNIONECTOMY     left  . ROTATOR CUFF REPAIR     right    Vitals:   06/10/16 0837  BP: 140/60  Pulse: 87  SpO2: 98%        Subjective Assessment - 06/10/16 0827    Subjective Pt states that he is doing well currently. She did not complete all her HEP because she lost her sheet and forgot her exercises. Pt states that she did attempt semitandem balance but it was difficulty for her. No specific questions or concerns currently.    Pertinent History Pt reports that she has been having trouble with her balance for an extended period of time. She staggers initially when she stands up and is wanting to improve this. She had a recent bout of vertigo where she had to come to [facility] ED on 04/15/16. Pt states that the symptoms occurred spontaneously while she was standing talking with a friend. She was discharged from the  hospital with a diagnosis of vertigo and a prescription for meclizine. She is not currently taking the meclizine and has had  no further bouts of vertigo. Pt currently denies dizziness. She states that she was sent to neurology and ENT and then referred to [facility] for vestibular therapy. ENT notes reviewed and state pt symptoms only occurred once but are better now. Pt is somewhat a difficulty historian and no family present to supplement. Friend is present but has no additional information as she is not involved with the patient's healthcare. Pt denies any other recent changes in her health.    Limitations Walking;Standing;House hold activities   Patient Stated Goals Pt would like to improve her balance   Currently in Pain? No/denies       TREATMENT  THER-EX Sit to stand without UE support x 10; Sit to stand without UE support with Airex pad under feet x 10; Quantum leg press 75# x 20, 90# x 15;  NEUROMUSCULAR RE-EDUCATION  Reviewed and performed HEP with patient including; Single leg balance practice x 30 seconds total on each leg in // bars;  Heel/toe raises in // bars x 10 each direction;  Semitandem progression with horizontal and vertical head turn alternating forward LE x 30 seconds each;   Forward stepping with overhead 1kg ball lift alternating LE  x 10 each; Forward stepping with overhead 1kg ball lift alternating LE with head/eye follow x 10 each; Stepping stone toe taps with therapist calling leg and color; Airex balance with 4" stair toe taps alternating LE x 10 each;  Verbal and tactile cues required for proper performance with exercise. Min to modA+1 support for LOB during balance activities. Intermittent seated rest breaks provided;                          PT Education - 06/10/16 0827    Education provided Yes   Education Details HEP reinforced and provided handout again   Person(s) Educated Patient   Methods Explanation;Demonstration;Verbal  cues;Handout   Comprehension Verbalized understanding;Returned demonstration             PT Long Term Goals - 06/03/16 1222      PT LONG TERM GOAL #1   Title  Pt will be independent with HEP in order to improve strength and balance in order to decrease fall risk and improve function at home and work.    Time 8   Period Weeks   Status New     PT LONG TERM GOAL #2   Title  Pt will improve ABC by at least 13% in order to demonstrate clinically significant improvement in balance confidence.    Baseline 06/03/16: 29.69%   Time 6   Period Weeks   Status New     PT LONG TERM GOAL #3   Title Pt will improve DGI by at least 3 points in order to demonstrate clinically significant improvement in balance and decreased risk for falls    Baseline 06/03/16: 14/24   Time 8   Period Weeks   Status New               Plan - 06/10/16 [id number]    Clinical Impression Statement Pt provided copy of HEP handout with extensive instructions about how to complete exercises safely. She demonstrates very poor balance today with all head turning activities. Pt also struggles with all balance activities on complaint surfaces. Pt encouraged to perform HEP and follow-up as scheduled. Will continue to progress balance and strengthening exercises at future sessions.    Rehab Potential Good   Clinical Impairments Affecting Rehab Potential (+) active, motivated  (-) age   PT Frequency 2x / week   PT Duration 8 weeks   PT Treatment/Interventions Patient/family education;Therapeutic exercise;Balance training;Aquatic Therapy;Canalith Repostioning;Electrical Stimulation;Iontophoresis 4mg /ml Dexamethasone;Moist Heat;Traction;Ultrasound;DME Instruction;Gait training;Therapeutic activities;Neuromuscular re-education;Cognitive remediation;Manual techniques;Passive range of motion;Vestibular   PT Next Visit Plan Progress balance and strengthening   PT Home Exercise Plan Heel/toe raises, single leg balance practice (slow  marches), and semi-tandem balance with horizontal/vertical head turns;   Consulted and Agree with Plan of Care Patient      Patient will benefit from skilled therapeutic intervention in order to improve the following deficits and impairments:  Decreased balance, Decreased safety awareness  Visit Diagnosis: Unsteadiness on feet  Muscle weakness (generalized)     Problem List Patient Active Problem List   Diagnosis Date Noted  . Mild cognitive impairment 05/15/2016  . Dizziness 04/21/2016  . Neck pain 02/11/2016  . Muscle cramps 11/14/2015  . Urine incontinence 08/16/2015  . Hematoma 08/16/2015  . Fatigue 07/12/2015  . Headache 06/16/2015  . Unsteady gait 06/01/2015  . GERD (gastroesophageal reflux disease) 08/11/2014  . UTI (urinary tract infection) 08/07/2014  . DYSPNEA 04/17/2009  . HYPERCHOLESTEROLEMIA 04/16/2009  . Obstructive sleep apnea  04/16/2009  . Glaucoma 04/16/2009  . Transient cerebral ischemia 04/16/2009  . BARRETTS ESOPHAGUS 04/16/2009  . OSTEOARTHRITIS 04/16/2009  . Osteoporosis 04/16/2009   Phillips Grout PT, DPT   Huprich,Jason 06/10/2016, 11:50 AM  [place] MAIN [facility] SERVICES [address] [place], [place], [address] Phone: [phone]   Fax:  [phone]  Name: MACAELA MAUS MRN: [id number] Date of Birth: Aug 15, 1919

## 2016-06-14 ENCOUNTER — Ambulatory Visit: Payer: Medicare Other

## 2016-06-14 VITALS — BP 116/61 | HR 78

## 2016-06-14 DIAGNOSIS — R2681 Unsteadiness on feet: Secondary | ICD-10-CM | POA: Diagnosis not present

## 2016-06-14 DIAGNOSIS — R29898 Other symptoms and signs involving the musculoskeletal system: Secondary | ICD-10-CM | POA: Diagnosis not present

## 2016-06-14 DIAGNOSIS — M6281 Muscle weakness (generalized): Secondary | ICD-10-CM | POA: Diagnosis not present

## 2016-06-14 NOTE — Therapy (Signed)
[place] MAIN [facility] SERVICES [address] [place], [place], [address] Phone: [phone]   Fax:  [phone]  Physical Therapy Treatment  Patient Details  Name: Christy Cisneros MRN: [id number] Date of Birth: [birth year] Referring Provider: Beverly Gust  Encounter Date: 06/14/2016      PT End of Session - 06/14/16 1510    Visit Number 4   Number of Visits 17   Date for PT Re-Evaluation 08-20-16   Authorization Type g codes 12-01-2022   PT Start Time 1503   PT Stop Time 1545   PT Time Calculation (min) 42 min   Equipment Utilized During Treatment Gait belt   Activity Tolerance Patient tolerated treatment well   Behavior During Therapy WFL for tasks assessed/performed      Past Medical History:  Diagnosis Date  . Barrett's esophagus with esophagitis   . Chronic bronchitis ([place])   . GERD (gastroesophageal reflux disease)   . Glaucoma   . Hypercholesteremia   . Osteoarthritis   . Osteoporosis   . Sleep apnea    has CPAP  . TIA (transient ischemic attack)     Past Surgical History:  Procedure Laterality Date  . BUNIONECTOMY     left  . ROTATOR CUFF REPAIR     right    Vitals:   06/14/16 1506  BP: 116/61  Pulse: 78  SpO2: 96%        Subjective Assessment - 06/14/16 1505    Subjective Pt reports she is doing well on this date. No specific questions or concerns currently. She brought her hiking pole to practice with therapist. Pt reports that she didn't perform her HEP until yesterday.    Pertinent History Pt reports that she has been having trouble with her balance for an extended period of time. She staggers initially when she stands up and is wanting to improve this. She had a recent bout of vertigo where she had to come to [facility] ED on 04/15/16. Pt states that the symptoms occurred spontaneously while she was standing talking with a friend. She was discharged from the hospital with a diagnosis of vertigo and a  prescription for meclizine. She is not currently taking the meclizine and has had  no further bouts of vertigo. Pt currently denies dizziness. She states that she was sent to neurology and ENT and then referred to [facility] for vestibular therapy. ENT notes reviewed and state pt symptoms only occurred once but are better now. Pt is somewhat a difficulty historian and no family present to supplement. Friend is present but has no additional information as she is not involved with the patient's healthcare. Pt denies any other recent changes in her health.    Limitations Walking;Standing;House hold activities   Patient Stated Goals Pt would like to improve her balance   Currently in Pain? No/denies        TREATMENT  THER-EX NuStep L1 x 3 minutes during history for warm-up (2 minutes unbilled); Quantum leg press 90# x 10; 95# 2 x 10; Forward lunge with overhead 1kg ball lift alternating LE with head/eye follow x 10 each; Sit to stand without UE support with Airex pad under feet x 10;  NEUROMUSCULAR RE-EDUCATION  Reviewed and performed HEP with patient including; Single leg balance practice x 30 seconds total on each leg in // bars;  Heel/toe raises in // bars x 10 each direction;  Semitandem progression with horizontal and vertical head turn alternating forward LE x 30 seconds each;  Practiced gait in hallway with hiking pole with heavy verbal and tactile feedback for safe performance and proper sequencing; Practiced stair ascend/descend in // bars with and without hiking pole demonstrating how much safer patient is when performing with pole, multiple bouts;  Verbal and tactile cues required for proper performance with exercise. Min to modA+1 support for LOB during balance activities. Intermittent seated rest breaks provided;                          PT Education - 06/14/16 1507    Education provided Yes   Education Details Reinforced importance of HEP, attempted to come  up with strategies to encourage HEP performance   Person(s) Educated Patient   Methods Explanation   Comprehension Verbalized understanding             PT Long Term Goals - 06/03/16 1222      PT LONG TERM GOAL #1   Title  Pt will be independent with HEP in order to improve strength and balance in order to decrease fall risk and improve function at home and work.    Time 8   Period Weeks   Status New     PT LONG TERM GOAL #2   Title  Pt will improve ABC by at least 13% in order to demonstrate clinically significant improvement in balance confidence.    Baseline 06/03/16: 29.69%   Time 6   Period Weeks   Status New     PT LONG TERM GOAL #3   Title Pt will improve DGI by at least 3 points in order to demonstrate clinically significant improvement in balance and decreased risk for falls    Baseline 06/03/16: 14/24   Time 8   Period Weeks   Status New               Plan - 06/14/16 1510    Clinical Impression Statement Discussed strategies with patient to improve compliance with HEP but pt unable to come up with a good strategy with patient. Sized hiking pole appropriately for patient however she reports that she doesn't want to use it and isn't likely to utilize at home. However therapist still provided strong encouragement to utilize. Able to progress resistance with leg press during session today. Pt encouraged to follow-up as scheduled and try to perform HEP more consistently.    Rehab Potential Good   Clinical Impairments Affecting Rehab Potential (+) active, motivated  (-) age   PT Frequency 2x / week   PT Duration 8 weeks   PT Treatment/Interventions Patient/family education;Therapeutic exercise;Balance training;Aquatic Therapy;Canalith Repostioning;Electrical Stimulation;Iontophoresis 4mg /ml Dexamethasone;Moist Heat;Traction;Ultrasound;DME Instruction;Gait training;Therapeutic activities;Neuromuscular re-education;Cognitive remediation;Manual techniques;Passive range  of motion;Vestibular   PT Next Visit Plan Progress balance and strengthening   PT Home Exercise Plan Heel/toe raises, single leg balance practice (slow marches), and semi-tandem balance with horizontal/vertical head turns;   Consulted and Agree with Plan of Care Patient      Patient will benefit from skilled therapeutic intervention in order to improve the following deficits and impairments:  Decreased balance, Decreased safety awareness, Abnormal gait, Decreased strength  Visit Diagnosis: Unsteadiness on feet  Muscle weakness (generalized)     Problem List Patient Active Problem List   Diagnosis Date Noted  . Mild cognitive impairment 05/15/2016  . Dizziness 04/21/2016  . Neck pain 02/11/2016  . Muscle cramps 11/14/2015  . Urine incontinence 08/16/2015  . Hematoma 08/16/2015  . Fatigue 07/12/2015  . Headache 06/16/2015  .  Unsteady gait 06/01/2015  . GERD (gastroesophageal reflux disease) 08/11/2014  . UTI (urinary tract infection) 08/07/2014  . DYSPNEA 04/17/2009  . HYPERCHOLESTEROLEMIA 04/16/2009  . Obstructive sleep apnea 04/16/2009  . Glaucoma 04/16/2009  . Transient cerebral ischemia 04/16/2009  . BARRETTS ESOPHAGUS 04/16/2009  . OSTEOARTHRITIS 04/16/2009  . Osteoporosis 04/16/2009   Phillips Grout PT, DPT   Karo Rog 06/15/2016, 8:16 AM  [place] MAIN [facility] SERVICES [address] [place], [place], [address] Phone: [phone]   Fax:  [phone]  Name: Christy Cisneros MRN: [id number] Date of Birth: 08-10-1919

## 2016-06-16 ENCOUNTER — Encounter: Payer: Self-pay | Admitting: Physical Therapy

## 2016-06-16 ENCOUNTER — Ambulatory Visit: Payer: Medicare Other | Admitting: Physical Therapy

## 2016-06-16 VITALS — BP 138/71 | HR 90

## 2016-06-16 DIAGNOSIS — R2681 Unsteadiness on feet: Secondary | ICD-10-CM

## 2016-06-16 DIAGNOSIS — M6281 Muscle weakness (generalized): Secondary | ICD-10-CM

## 2016-06-16 DIAGNOSIS — R29898 Other symptoms and signs involving the musculoskeletal system: Secondary | ICD-10-CM | POA: Diagnosis not present

## 2016-06-16 NOTE — Therapy (Signed)
[place] MAIN [facility] SERVICES [address] [place], [place], [address] Phone: [phone]   Fax:  [phone]  Physical Therapy Treatment  Patient Details  Name: Christy Cisneros MRN: [id number] Date of Birth: Jan 17, 1920 Referring Provider: Beverly Gust  Encounter Date: 06/16/2016      PT End of Session - 06/16/16 1153    Visit Number 5   Number of Visits 17   Date for PT Re-Evaluation 2016-08-01   Authorization Type g codes 5/10   PT Start Time 1116   PT Stop Time 1200   PT Time Calculation (min) 44 min   Equipment Utilized During Treatment Gait belt   Activity Tolerance Patient tolerated treatment well   Behavior During Therapy WFL for tasks assessed/performed      Past Medical History:  Diagnosis Date  . Barrett's esophagus with esophagitis   . Chronic bronchitis ([place])   . GERD (gastroesophageal reflux disease)   . Glaucoma   . Hypercholesteremia   . Osteoarthritis   . Osteoporosis   . Sleep apnea    has CPAP  . TIA (transient ischemic attack)     Past Surgical History:  Procedure Laterality Date  . BUNIONECTOMY     left  . ROTATOR CUFF REPAIR     right    Vitals:   06/16/16 1122  BP: 138/71  Pulse: 90  SpO2: 98%        Subjective Assessment - 06/16/16 1120    Subjective Pt reports she has not been doing her HEP as she has forgotten.  She has brought her hiking pole in and would like to have this PT make sure she is using it properly.  No complaints or concerns today.     Pertinent History Pt reports that she has been having trouble with her balance for an extended period of time. She staggers initially when she stands up and is wanting to improve this. She had a recent bout of vertigo where she had to come to [facility] ED on 04/15/16. Pt states that the symptoms occurred spontaneously while she was standing talking with a friend. She was discharged from the hospital with a diagnosis of vertigo and a prescription  for meclizine. She is not currently taking the meclizine and has had  no further bouts of vertigo. Pt currently denies dizziness. She states that she was sent to neurology and ENT and then referred to [facility] for vestibular therapy. ENT notes reviewed and state pt symptoms only occurred once but are better now. Pt is somewhat a difficulty historian and no family present to supplement. Friend is present but has no additional information as she is not involved with the patient's healthcare. Pt denies any other recent changes in her health.    Limitations Walking;Standing;House hold activities   Patient Stated Goals Pt would like to improve her balance   Currently in Pain? No/denies       Treatment   Therapeutic Exercise:  Forward lunge with overhead 1kg ball lift alternating LE with head/eye follow x 10 each  Sit<>stand 2x10 with cues for eccentric control, pt fatigues after 6 reps each set. Cues for no UE use. 1 LOB but pt able to stabilize using // bar.   Neuromuscular Rehab:  Ambulating in hallway with hiking pole with horizontal and vertical head turns x3 minutes  Sitting on theraball with arms crossed in [place] x1 minute, min assist  Sitting on theraball with arms crossed in tandem stance with R foot forward  x1 minute, with L foot forward x1 minute, min assist  [place] on airex with ball toss 2x10 from R side and then L side, intermittent min A  Tandem walking on airex in // bars x4 lengths with 1HHA  Sideways walking on airex in // bars x lengths with 1HHA                PT Education - 06/16/16 1123    Education provided Yes   Education Details Reinforced importance of completing HEP as prescribed; Exercise technique; Posture when ambulating   Person(s) Educated Patient   Methods Explanation;Demonstration;Verbal cues;Tactile cues   Comprehension Verbalized understanding;Need further instruction;Returned demonstration             PT Long Term Goals - 06/03/16 1222       PT LONG TERM GOAL #1   Title  Pt will be independent with HEP in order to improve strength and balance in order to decrease fall risk and improve function at home and work.    Time 8   Period Weeks   Status New     PT LONG TERM GOAL #2   Title  Pt will improve ABC by at least 13% in order to demonstrate clinically significant improvement in balance confidence.    Baseline 06/03/16: 29.69%   Time 6   Period Weeks   Status New     PT LONG TERM GOAL #3   Title Pt will improve DGI by at least 3 points in order to demonstrate clinically significant improvement in balance and decreased risk for falls    Baseline 06/03/16: 14/24   Time 8   Period Weeks   Status New               Plan - 06/16/16 1209    Clinical Impression Statement Pt asking therapist if she should be using her hiking pole whenever she is walking and this therapist encouraged to do so although pt reports, "I hate this thing" even after this therapist explained the benefits of using the hiking pole for stability.  She tolerated all interventions well this date, she did need several seated rest breaks between exercises due to fatigue.  She continues to demonstrate difficulty with balance activities and will benefit from additional PT interventions.   Rehab Potential Good   Clinical Impairments Affecting Rehab Potential (+) active, motivated  (-) age   PT Frequency 2x / week   PT Duration 8 weeks   PT Treatment/Interventions Patient/family education;Therapeutic exercise;Balance training;Aquatic Therapy;Canalith Repostioning;Electrical Stimulation;Iontophoresis 4mg /ml Dexamethasone;Moist Heat;Traction;Ultrasound;DME Instruction;Gait training;Therapeutic activities;Neuromuscular re-education;Cognitive remediation;Manual techniques;Passive range of motion;Vestibular   PT Next Visit Plan Progress balance and strengthening   PT Home Exercise Plan Heel/toe raises, single leg balance practice (slow marches), and semi-tandem  balance with horizontal/vertical head turns;   Consulted and Agree with Plan of Care Patient      Patient will benefit from skilled therapeutic intervention in order to improve the following deficits and impairments:  Decreased balance, Decreased safety awareness, Abnormal gait, Decreased strength  Visit Diagnosis: Unsteadiness on feet  Muscle weakness (generalized)     Problem List Patient Active Problem List   Diagnosis Date Noted  . Mild cognitive impairment 05/15/2016  . Dizziness 04/21/2016  . Neck pain 02/11/2016  . Muscle cramps 11/14/2015  . Urine incontinence 08/16/2015  . Hematoma 08/16/2015  . Fatigue 07/12/2015  . Headache 06/16/2015  . Unsteady gait 06/01/2015  . GERD (gastroesophageal reflux disease) 08/11/2014  . UTI (urinary tract infection) 08/07/2014  .  DYSPNEA 04/17/2009  . HYPERCHOLESTEROLEMIA 04/16/2009  . Obstructive sleep apnea 04/16/2009  . Glaucoma 04/16/2009  . Transient cerebral ischemia 04/16/2009  . BARRETTS ESOPHAGUS 04/16/2009  . OSTEOARTHRITIS 04/16/2009  . Osteoporosis 04/16/2009    Collie Siad PT, DPT 06/16/2016, 12:11 PM  [place] MAIN [facility] SERVICES [address] [place], [place], [address] Phone: [phone]   Fax:  [phone]  Name: MIKELLA AMIRI MRN: [id number] Date of Birth: 1920/01/12

## 2016-06-17 ENCOUNTER — Ambulatory Visit: Payer: Medicare Other

## 2016-06-17 ENCOUNTER — Encounter: Payer: Medicare Other | Admitting: Physical Therapy

## 2016-06-20 ENCOUNTER — Encounter: Payer: Self-pay | Admitting: Physical Therapy

## 2016-06-20 ENCOUNTER — Ambulatory Visit: Payer: Medicare Other | Admitting: Physical Therapy

## 2016-06-20 DIAGNOSIS — R2681 Unsteadiness on feet: Secondary | ICD-10-CM

## 2016-06-20 DIAGNOSIS — R29898 Other symptoms and signs involving the musculoskeletal system: Secondary | ICD-10-CM | POA: Diagnosis not present

## 2016-06-20 DIAGNOSIS — M6281 Muscle weakness (generalized): Secondary | ICD-10-CM

## 2016-06-20 NOTE — Therapy (Signed)
[place] MAIN [facility] SERVICES [address] [place], [place], [address] Phone: [phone]   Fax:  [phone]  Physical Therapy Treatment  Patient Details  Name: Christy Cisneros MRN: [id number] Date of Birth: [birth year] Referring Provider: Beverly Gust  Encounter Date: 06/20/2016      PT End of Session - 06/20/16 1056    Visit Number 6   Number of Visits 17   Date for PT Re-Evaluation 03-Aug-2016   Authorization Type g codes 6/10   PT Start Time 0940   PT Stop Time 1022   PT Time Calculation (min) 42 min   Equipment Utilized During Treatment Gait belt   Activity Tolerance Patient tolerated treatment well   Behavior During Therapy WFL for tasks assessed/performed      Past Medical History:  Diagnosis Date  . Barrett's esophagus with esophagitis   . Chronic bronchitis ([place])   . GERD (gastroesophageal reflux disease)   . Glaucoma   . Hypercholesteremia   . Osteoarthritis   . Osteoporosis   . Sleep apnea    has CPAP  . TIA (transient ischemic attack)     Past Surgical History:  Procedure Laterality Date  . BUNIONECTOMY     left  . ROTATOR CUFF REPAIR     right    There were no vitals filed for this visit.      Subjective Assessment - 06/20/16 0945    Subjective Patient states she has not been doing her HEP. Discussed and reinforced importance of HEP. Patient states she has not been using her hiking pole because it is not convienent and "it gets in the way". Patient asking whether she needs to use hiking pole. Discussed purpose and benefit for using hiking pole and discussed that this would help reduce her risk of falling and patient states that she will start using the pole. Patient states she will bring in at next session so that she can practice using it again.    Pertinent History Pt reports that she has been having trouble with her balance for an extended period of time. She staggers initially when she stands up and  is wanting to improve this. She had a recent bout of vertigo where she had to come to [facility] ED on 04/15/16. Pt states that the symptoms occurred spontaneously while she was standing talking with a friend. She was discharged from the hospital with a diagnosis of vertigo and a prescription for meclizine. She is not currently taking the meclizine and has had  no further bouts of vertigo. Pt currently denies dizziness. She states that she was sent to neurology and ENT and then referred to [facility] for vestibular therapy. ENT notes reviewed and state pt symptoms only occurred once but are better now. Pt is somewhat a difficulty historian and no family present to supplement. Friend is present but has no additional information as she is not involved with the patient's healthcare. Pt denies any other recent changes in her health.    Limitations Walking;Standing;House hold activities   Patient Stated Goals Pt would like to improve her balance   Currently in Pain? --  none stated     Therapeutic exercise:  Performed 25 reps bilateral toe raises without UEs support with a few minor losses of balance that patient was able to self-correct.  Patient performed alternating side stepping with mini-squat multiple 5' reps.     Neuromuscular Re-education:  On Airex balance beam, performed sideways stance static holds with and without  horizontal and vertical head turns. Patient with increased loss of balance with adding in head turns but demonstrates fair to good use of ankle and hip strategies to help correct.   On Airex balance beam, performed sideways stepping with horizontal head turns 5' multiple reps with CGA. Patient had difficulty sequencing with this activity therefore patient performed static sideways stance while doing body turns side to side.   Patient performed step ups on 6" wooden box 10 reps without upper extremity support. Patient required min A twice to correct losses of balance and patient was able to  self-correct several smaller losses of balance as well.  Performed side stepping up onto box and back down 10 reps each side.    Patient ambulated 14' and 100' utilizing trekking pole in right hand with vc initially for sequencing with supervision. Patient with no loss of balance ambulating in straight path or when navigating around gym around equipment and with turning.         PT Education - 06/20/16 1056    Education provided Yes   Education Details Reinforced importance of HEP and using hiking pole    Person(s) Educated Patient   Methods Explanation;Demonstration   Comprehension Verbalized understanding;Verbal cues required             PT Long Term Goals - 06/03/16 1222      PT LONG TERM GOAL #1   Title  Pt will be independent with HEP in order to improve strength and balance in order to decrease fall risk and improve function at home and work.    Time 8   Period Weeks   Status New     PT LONG TERM GOAL #2   Title  Pt will improve ABC by at least 13% in order to demonstrate clinically significant improvement in balance confidence.    Baseline 06/03/16: 29.69%   Time 6   Period Weeks   Status New     PT LONG TERM GOAL #3   Title Pt will improve DGI by at least 3 points in order to demonstrate clinically significant improvement in balance and decreased risk for falls    Baseline 06/03/16: 14/24   Time 8   Period Weeks   Status New               Plan - 06/20/16 1057    Clinical Impression Statement Patient asking again if she needs to use hiking pole. Explained purpose and benefit for hiking pole and patient states she does feel she should be using the pole and states that she will try to start using it. Discussed again importance of HEP. Patient demonstrates difficulty with narrow BOS, uneven surfaces and actiivities with head and body turns. Patient would benefit from continued PT services to address functional deficits.    Rehab Potential Good   Clinical  Impairments Affecting Rehab Potential (+) active, motivated  (-) age   PT Frequency 2x / week   PT Duration 8 weeks   PT Treatment/Interventions Patient/family education;Therapeutic exercise;Balance training;Aquatic Therapy;Canalith Repostioning;Electrical Stimulation;Iontophoresis 4mg /ml Dexamethasone;Moist Heat;Traction;Ultrasound;DME Instruction;Gait training;Therapeutic activities;Neuromuscular re-education;Cognitive remediation;Manual techniques;Passive range of motion;Vestibular   PT Next Visit Plan Progress balance and strengthening; practice with hiking pole with head turns, leg press   PT Home Exercise Plan Heel/toe raises, single leg balance practice (slow marches), and semi-tandem balance with horizontal/vertical head turns;   Consulted and Agree with Plan of Care Patient      Patient will benefit from skilled therapeutic intervention in order to  improve the following deficits and impairments:  Decreased balance, Decreased safety awareness, Abnormal gait, Decreased strength  Visit Diagnosis: Unsteadiness on feet  Muscle weakness (generalized)  Weakness of both lower extremities     Problem List Patient Active Problem List   Diagnosis Date Noted  . Mild cognitive impairment 05/15/2016  . Dizziness 04/21/2016  . Neck pain 02/11/2016  . Muscle cramps 11/14/2015  . Urine incontinence 08/16/2015  . Hematoma 08/16/2015  . Fatigue 07/12/2015  . Headache 06/16/2015  . Unsteady gait 06/01/2015  . GERD (gastroesophageal reflux disease) 08/11/2014  . UTI (urinary tract infection) 08/07/2014  . DYSPNEA 04/17/2009  . HYPERCHOLESTEROLEMIA 04/16/2009  . Obstructive sleep apnea 04/16/2009  . Glaucoma 04/16/2009  . Transient cerebral ischemia 04/16/2009  . BARRETTS ESOPHAGUS 04/16/2009  . OSTEOARTHRITIS 04/16/2009  . Osteoporosis 04/16/2009   Lady Deutscher PT, DPT Lady Deutscher 06/20/2016, 11:09 AM  [place] MAIN [facility] SERVICES [address] [place], [place], [address] Phone: [phone]   Fax:  [phone]  Name: REYLEE APPELL MRN: [id number] Date of Birth: 1920-07-14

## 2016-06-21 ENCOUNTER — Ambulatory Visit: Payer: Medicare Other | Admitting: Physical Therapy

## 2016-06-21 ENCOUNTER — Ambulatory Visit: Payer: Medicare Other

## 2016-06-21 ENCOUNTER — Encounter: Payer: Medicare Other | Admitting: Physical Therapy

## 2016-06-27 DIAGNOSIS — H5211 Myopia, right eye: Secondary | ICD-10-CM | POA: Diagnosis not present

## 2016-06-27 DIAGNOSIS — H52221 Regular astigmatism, right eye: Secondary | ICD-10-CM | POA: Diagnosis not present

## 2016-06-27 DIAGNOSIS — S0502XA Injury of conjunctiva and corneal abrasion without foreign body, left eye, initial encounter: Secondary | ICD-10-CM | POA: Diagnosis not present

## 2016-06-27 DIAGNOSIS — H353132 Nonexudative age-related macular degeneration, bilateral, intermediate dry stage: Secondary | ICD-10-CM | POA: Diagnosis not present

## 2016-06-27 DIAGNOSIS — H401123 Primary open-angle glaucoma, left eye, severe stage: Secondary | ICD-10-CM | POA: Diagnosis not present

## 2016-06-27 DIAGNOSIS — H35363 Drusen (degenerative) of macula, bilateral: Secondary | ICD-10-CM | POA: Diagnosis not present

## 2016-06-27 DIAGNOSIS — H04123 Dry eye syndrome of bilateral lacrimal glands: Secondary | ICD-10-CM | POA: Diagnosis not present

## 2016-06-27 DIAGNOSIS — H524 Presbyopia: Secondary | ICD-10-CM | POA: Diagnosis not present

## 2016-06-27 DIAGNOSIS — H1820 Unspecified corneal edema: Secondary | ICD-10-CM | POA: Diagnosis not present

## 2016-06-27 DIAGNOSIS — H47233 Glaucomatous optic atrophy, bilateral: Secondary | ICD-10-CM | POA: Diagnosis not present

## 2016-06-27 DIAGNOSIS — H401113 Primary open-angle glaucoma, right eye, severe stage: Secondary | ICD-10-CM | POA: Diagnosis not present

## 2016-06-29 ENCOUNTER — Ambulatory Visit: Payer: Medicare Other

## 2016-06-29 ENCOUNTER — Encounter: Payer: Medicare Other | Admitting: Physical Therapy

## 2016-06-29 VITALS — BP 132/69 | HR 85

## 2016-06-29 DIAGNOSIS — R29898 Other symptoms and signs involving the musculoskeletal system: Secondary | ICD-10-CM | POA: Diagnosis not present

## 2016-06-29 DIAGNOSIS — H401123 Primary open-angle glaucoma, left eye, severe stage: Secondary | ICD-10-CM | POA: Diagnosis not present

## 2016-06-29 DIAGNOSIS — H524 Presbyopia: Secondary | ICD-10-CM | POA: Diagnosis not present

## 2016-06-29 DIAGNOSIS — R2681 Unsteadiness on feet: Secondary | ICD-10-CM

## 2016-06-29 DIAGNOSIS — H5211 Myopia, right eye: Secondary | ICD-10-CM | POA: Diagnosis not present

## 2016-06-29 DIAGNOSIS — S0502XD Injury of conjunctiva and corneal abrasion without foreign body, left eye, subsequent encounter: Secondary | ICD-10-CM | POA: Diagnosis not present

## 2016-06-29 DIAGNOSIS — H47233 Glaucomatous optic atrophy, bilateral: Secondary | ICD-10-CM | POA: Diagnosis not present

## 2016-06-29 DIAGNOSIS — H04123 Dry eye syndrome of bilateral lacrimal glands: Secondary | ICD-10-CM | POA: Diagnosis not present

## 2016-06-29 DIAGNOSIS — M6281 Muscle weakness (generalized): Secondary | ICD-10-CM

## 2016-06-29 DIAGNOSIS — H401113 Primary open-angle glaucoma, right eye, severe stage: Secondary | ICD-10-CM | POA: Diagnosis not present

## 2016-06-29 DIAGNOSIS — H35363 Drusen (degenerative) of macula, bilateral: Secondary | ICD-10-CM | POA: Diagnosis not present

## 2016-06-29 DIAGNOSIS — H52221 Regular astigmatism, right eye: Secondary | ICD-10-CM | POA: Diagnosis not present

## 2016-06-29 DIAGNOSIS — H353132 Nonexudative age-related macular degeneration, bilateral, intermediate dry stage: Secondary | ICD-10-CM | POA: Diagnosis not present

## 2016-06-29 NOTE — Therapy (Signed)
[place] MAIN [facility] SERVICES [address] [place], [place], [address] Phone: [phone]   Fax:  [phone]  Physical Therapy Treatment  Patient Details  Name: Christy Cisneros MRN: [id number] Date of Birth: 1920-04-18 Referring Provider: Beverly Gust  Encounter Date: 06/29/2016      PT End of Session - 06/29/16 1021    Visit Number 7   Number of Visits 17   Date for PT Re-Evaluation 08-24-16   Authorization Type g codes 7/10   PT Start Time 1010   PT Stop Time 1100   PT Time Calculation (min) 50 min   Equipment Utilized During Treatment Gait belt   Activity Tolerance Patient tolerated treatment well   Behavior During Therapy WFL for tasks assessed/performed      Past Medical History:  Diagnosis Date  . Barrett's esophagus with esophagitis   . Chronic bronchitis ([place])   . GERD (gastroesophageal reflux disease)   . Glaucoma   . Hypercholesteremia   . Osteoarthritis   . Osteoporosis   . Sleep apnea    has CPAP  . TIA (transient ischemic attack)     Past Surgical History:  Procedure Laterality Date  . BUNIONECTOMY     left  . ROTATOR CUFF REPAIR     right    Vitals:   06/29/16 1018  BP: 132/69  Pulse: 85  SpO2: 100%        Subjective Assessment - 06/29/16 1018    Subjective Pt reports that she has been using her hiking pole since her last therapy session. "I love it!" She has been performing HEP but states she has not been consistent due to forgetfulness. She reports that her eye doctor placed her on antiobiotic eye drops. When she stood up this morning she states that she "lurched" and would like to know if the eye drops could have caused this today.    Pertinent History Pt reports that she has been having trouble with her balance for an extended period of time. She staggers initially when she stands up and is wanting to improve this. She had a recent bout of vertigo where she had to come to [facility] ED on  04/15/16. Pt states that the symptoms occurred spontaneously while she was standing talking with a friend. She was discharged from the hospital with a diagnosis of vertigo and a prescription for meclizine. She is not currently taking the meclizine and has had  no further bouts of vertigo. Pt currently denies dizziness. She states that she was sent to neurology and ENT and then referred to [facility] for vestibular therapy. ENT notes reviewed and state pt symptoms only occurred once but are better now. Pt is somewhat a difficulty historian and no family present to supplement. Friend is present but has no additional information as she is not involved with the patient's healthcare. Pt denies any other recent changes in her health.    Limitations Walking;Standing;House hold activities   Patient Stated Goals Pt would like to improve her balance            TREATMENT  Progress balance and strengthening; practice with hiking pole with head turns, leg press   Therapeutic exercise NuStep L2 x 6 minutes for warm-up during history (4 minutes unbilled); Quantum 92.5# x 10, 95# x 10, 105# x 10; Performed 20 reps bilateral toe/heel raises without UEs support with a few minor losses of balance that patient was able to self-correct. Sit to stand from regular height chair  without UE support standing on Airex pad 2 x 10;   Neuromuscular Re-education Feet together/apart with horizontal and vertical head turns x 1 minute each; On Airex balance beam, performed sideways stepping x 4 lengths with CGA/minA. Pt with incrased balance difficulty on this date. Patient performed step ups on 6" wooden box 10 reps without upper extremity support with 1kg overhead ball press once on step with vertical head/eye turns following ball. Patient required min A/modA intermittently due to LOB during activity during stepping task; Patient ambulated 75' x multiple bouts in hallway with trekking pole in right hand with horizontal and  vertical head turns. Pt demonstrates mild to moderate lateral gait deviation with head turns but able to self correct with stepping strategies and use of hiking pole.                            PT Education - 06/29/16 1021    Education provided Yes   Education Details Importance of HEP   Person(s) Educated Patient   Methods Explanation   Comprehension Verbalized understanding             PT Long Term Goals - 06/03/16 1222      PT LONG TERM GOAL #1   Title  Pt will be independent with HEP in order to improve strength and balance in order to decrease fall risk and improve function at home and work.    Time 8   Period Weeks   Status New     PT LONG TERM GOAL #2   Title  Pt will improve ABC by at least 13% in order to demonstrate clinically significant improvement in balance confidence.    Baseline 06/03/16: 29.69%   Time 6   Period Weeks   Status New     PT LONG TERM GOAL #3   Title Pt will improve DGI by at least 3 points in order to demonstrate clinically significant improvement in balance and decreased risk for falls    Baseline 06/03/16: 14/24   Time 8   Period Weeks   Status New               Plan - 06/29/16 1021    Clinical Impression Statement Pt demonstrates difficulty with balance on Airex pad especially with head turns. She also demonstrates lateral gait deviation with head turns during ambulation with hiking pole. Pt able to increase her resistance today with leg press. Reinforced importance of HEP. She seems to be appreciating use of hiking pole for added stability with ambulation. Pt encouraged to follow-up as scheduled.    Rehab Potential Good   Clinical Impairments Affecting Rehab Potential (+) active, motivated  (-) age   PT Frequency 2x / week   PT Duration 8 weeks   PT Treatment/Interventions Patient/family education;Therapeutic exercise;Balance training;Aquatic Therapy;Canalith Repostioning;Electrical Stimulation;Iontophoresis  4mg /ml Dexamethasone;Moist Heat;Traction;Ultrasound;DME Instruction;Gait training;Therapeutic activities;Neuromuscular re-education;Cognitive remediation;Manual techniques;Passive range of motion;Vestibular   PT Next Visit Plan Progress balance and strengthening; practice with hiking pole with head turns, leg press progression   PT Home Exercise Plan Heel/toe raises, single leg balance practice (slow marches), and semi-tandem balance with horizontal/vertical head turns;   Consulted and Agree with Plan of Care Patient      Patient will benefit from skilled therapeutic intervention in order to improve the following deficits and impairments:  Decreased balance, Decreased safety awareness, Abnormal gait, Decreased strength  Visit Diagnosis: Unsteadiness on feet  Muscle weakness (generalized)  Problem List Patient Active Problem List   Diagnosis Date Noted  . Mild cognitive impairment 05/15/2016  . Dizziness 04/21/2016  . Neck pain 02/11/2016  . Muscle cramps 11/14/2015  . Urine incontinence 08/16/2015  . Hematoma 08/16/2015  . Fatigue 07/12/2015  . Headache 06/16/2015  . Unsteady gait 06/01/2015  . GERD (gastroesophageal reflux disease) 08/11/2014  . UTI (urinary tract infection) 08/07/2014  . DYSPNEA 04/17/2009  . HYPERCHOLESTEROLEMIA 04/16/2009  . Obstructive sleep apnea 04/16/2009  . Glaucoma 04/16/2009  . Transient cerebral ischemia 04/16/2009  . BARRETTS ESOPHAGUS 04/16/2009  . OSTEOARTHRITIS 04/16/2009  . Osteoporosis 04/16/2009   Phillips Grout PT, DPT   Huprich,Jason 06/29/2016, 3:17 PM  [place] MAIN [facility] SERVICES [address] [place], [place], [address] Phone: [phone]   Fax:  [phone]  Name: Christy Cisneros MRN: [id number] Date of Birth: 09-10-1919

## 2016-07-01 ENCOUNTER — Encounter: Payer: Medicare Other | Admitting: Physical Therapy

## 2016-07-01 ENCOUNTER — Ambulatory Visit: Payer: Medicare Other | Attending: Unknown Physician Specialty | Admitting: Physical Therapy

## 2016-07-01 ENCOUNTER — Encounter: Payer: Self-pay | Admitting: Physical Therapy

## 2016-07-01 ENCOUNTER — Ambulatory Visit (INDEPENDENT_AMBULATORY_CARE_PROVIDER_SITE_OTHER): Payer: Medicare Other

## 2016-07-01 VITALS — BP 128/70 | HR 85 | Temp 97.6°F | Resp 14 | Ht 59.0 in | Wt 101.1 lb

## 2016-07-01 DIAGNOSIS — Z0000 Encounter for general adult medical examination without abnormal findings: Secondary | ICD-10-CM | POA: Diagnosis not present

## 2016-07-01 DIAGNOSIS — M6281 Muscle weakness (generalized): Secondary | ICD-10-CM | POA: Diagnosis not present

## 2016-07-01 DIAGNOSIS — R2681 Unsteadiness on feet: Secondary | ICD-10-CM

## 2016-07-01 NOTE — Progress Notes (Signed)
Subjective:   Christy Cisneros is a 82 y.o. female who presents for an Initial Medicare Annual Wellness Visit.  Review of Systems    No ROS.  Medicare Wellness Visit.  Cardiac Risk Factors include: advanced age (>40men, >57 women)     Objective:    Today's Vitals   07/01/16 0825  BP: 128/70  Pulse: 85  Resp: 14  Temp: 97.6 F (36.4 C)  TempSrc: Oral  SpO2: 95%  Weight: 101 lb 1.9 oz (45.9 kg)  Height: 4\' 11"  (1.499 m)   Body mass index is 20.42 kg/m.   Current Medications (verified) Outpatient Encounter Prescriptions as of 07/01/2016  Medication Sig  . Aloe LIQD Take by mouth daily.   Marland Kitchen aspirin 81 MG tablet Take 81 mg by mouth daily.  . Bilberry, Vaccinium myrtillus, (BILBERRY PO) Take 1 capsule by mouth daily.  . calcium carbonate (OS-CAL) 600 MG TABS Take 600 mg by mouth daily.  . dorzolamide-timolol (COSOPT) 22.3-6.8 MG/ML ophthalmic solution Apply to eye. Reported on 02/11/2016  . erythromycin ophthalmic ointment Reported on 02/11/2016  . Flaxseed, Linseed, (GROUND FLAX SEEDS PO) Take by mouth daily.   Marland Kitchen latanoprost (XALATAN) 0.005 % ophthalmic solution Reported on 02/11/2016  . meclizine (ANTIVERT) 25 MG tablet Take 0.5 tablets (12.5 mg total) by mouth 3 (three) times daily as needed.  . Multiple Vitamins-Minerals (PRESERVISION AREDS 2 PO) Take by mouth 2 (two) times daily.  Marland Kitchen omeprazole (PRILOSEC) 20 MG capsule   . prednisoLONE acetate (PRED FORTE) 1 % ophthalmic suspension Place 1 drop into the left eye 2 (two) times daily.  . timolol (BETIMOL) 0.5 % ophthalmic solution Place 1 drop into the right eye daily.  . [DISCONTINUED] prednisoLONE acetate (PRED FORTE) 1 % ophthalmic suspension Apply to eye.  . [DISCONTINUED] timolol (TIMOPTIC) 0.5 % ophthalmic solution Apply to eye.   No facility-administered encounter medications on file as of 07/01/2016.     Allergies (verified) Alphagan [brimonidine]; Avelox [moxifloxacin hcl in nacl]; Bacitracin; Cefdinir;  Cephalexin; Clarithromycin; Codeine; Erythromycin; Hydrocodone-acetaminophen; Lansoprazole; Polymyxin b; and Vigamox [moxifloxacin]   History: Past Medical History:  Diagnosis Date  . Barrett's esophagus with esophagitis   . Chronic bronchitis ([place])   . GERD (gastroesophageal reflux disease)   . Glaucoma   . Hypercholesteremia   . Osteoarthritis   . Osteoporosis   . Sleep apnea    has CPAP  . TIA (transient ischemic attack)    Past Surgical History:  Procedure Laterality Date  . BUNIONECTOMY     left  . ROTATOR CUFF REPAIR     right   Family History  Problem Relation Age of Onset  . Breast cancer Sister   . Heart disease Mother    Social History   Occupational History  . Not on file.   Social History Main Topics  . Smoking status: Never Smoker  . Smokeless tobacco: Never Used  . Alcohol use 0.0 oz/week     Comment: wine/brandy occasional  . Drug use: No  . Sexual activity: No    Tobacco Counseling Counseling given: Not Answered   Activities of Daily Living In your present state of health, do you have any difficulty performing the following activities: 07/01/2016  Hearing? Y  Vision? Y  Difficulty concentrating or making decisions? N  Walking or climbing stairs? Y  Dressing or bathing? N  Doing errands, shopping? Y  Preparing Food and eating ? N  Using the Toilet? N  In the past six months, have you accidently leaked  urine? Y  Do you have problems with loss of bowel control? N  Managing your Medications? N  Managing your Finances? N  Housekeeping or managing your Housekeeping? N  Some recent data might be hidden    Immunizations and Health Maintenance Immunization History  Administered Date(s) Administered  . Influenza Split 04/22/2014  . Influenza-Unspecified 05/07/2013, 05/15/2014, 04/27/2015, 05/01/2016  . Pneumococcal Conjugate-13 12/12/2013  . Pneumococcal Polysaccharide-23 05/28/2015   There are no preventive care reminders to display for  this patient.  Patient Care Team: Christy Pheasant, MD as PCP - General (Internal Medicine) Christy Pheasant, MD (Internal Medicine)  Indicate any recent Medical Services you may have received from other than [facility] providers in the past year (date may be approximate).     Assessment:   This is a routine wellness examination for Christy Cisneros. The goal of the wellness visit is to assist the patient how to close the gaps in care and create a preventative care plan for the patient.   Osteoporosis risk reviewed.  Medications reviewed; taking without issues or barriers.  Safety issues reviewed; smoke detectors in the home. No firearms in the home. Wears seatbelts when driving or riding with others. No violence in the home.  No identified risk were noted; The patient was oriented x 3; appropriate in dress and manner and no objective failures at ADL's or IADL's.   BMI; discussed the importance of a healthy diet, water intake and exercise. Educational material provided.  Health maintenance gaps; closed.  Patient Concerns: None at this time. Follow up with PCP as needed.  Hearing/Vision screen Hearing Screening Comments: Followed by Dr. Tami Cisneros and Dr. Williemae Cisneros Bilateral hearing aids Visits every 3 months Vision Screening Comments: Followed by Dr. Matilde Cisneros Wears glasses Glaucoma Last OV 05/2016   Dietary issues and exercise activities discussed: Current Exercise Habits: Home exercise routine (Chair/standing exercises), Time (Minutes): 15, Intensity: Mild  Goals    . Increase water intake      Depression Screen PHQ 2/9 Scores 05/13/2016 04/21/2016 09/29/2015 12/11/2012 05/29/2012  PHQ - 2 Score 0 0 0 0 1    Fall Risk Fall Risk  05/13/2016 04/21/2016 09/29/2015 12/11/2012 05/31/2012  Falls in the past year? No No No No -  Risk for fall due to : - - - - History of fall(s);Impaired balance/gait    Cognitive Function:     6CIT Screen 07/01/2016  What Year? 0 points  What month? 0 points    What time? 0 points  Count back from 20 2 points  Months in reverse 0 points    Screening Tests Health Maintenance  Topic Date Due  . MAMMOGRAM  08/13/2025 (Originally 11/16/2012)  . TETANUS/TDAP  10/21/2020  . INFLUENZA VACCINE  Completed  . DEXA SCAN  Completed  . ZOSTAVAX  Completed  . PNA vac Low Risk Adult  Completed      Plan:    End of life planning; Advance aging; Advanced directives discussed. Copy of current HCPOA/Living Will requested.  Medicare Attestation I have personally reviewed: The patient's medical and social history Their use of alcohol, tobacco or illicit drugs Their current medications and supplements The patient's functional ability including ADLs,fall risks, home safety risks, cognitive, and hearing and visual impairment Diet and physical activities Evidence for depression   The patient's weight, height, BMI, and visual acuity have been recorded in the chart.  I have made referrals and provided education to the patient based on review of the above and I have provided the patient with  a written personalized care plan for preventive services.    During the course of the visit, Daphna was educated and counseled about the following appropriate screening and preventive services:   Vaccines to include Pneumoccal, Influenza, Hepatitis B, Td, Zostavax, HCV  Electrocardiogram  Cardiovascular disease screening  Colorectal cancer screening  Bone density screening  Diabetes screening  Glaucoma screening  Mammography/PAP  Nutrition counseling  Smoking cessation counseling  Patient Instructions (the written plan) were given to the patient.    Varney Biles, LPN   [id number]   Reviewed above information.  Agree with plan.  Dr Nicki Reaper

## 2016-07-01 NOTE — Patient Instructions (Addendum)
Christy Cisneros , Thank you for taking time to come for your Medicare Wellness Visit. I appreciate your ongoing commitment to your health goals. Please review the following plan we discussed and let me know if I can assist you in the future.   These are the goals we discussed: Goals    . Increase water intake       This is a list of the screening recommended for you and due dates:  Health Maintenance  Topic Date Due  . Mammogram  08/13/2025*  . Tetanus Vaccine  10/21/2020  . Flu Shot  Completed  . DEXA scan (bone density measurement)  Completed  . Shingles Vaccine  Completed  . Pneumonia vaccines  Completed  *Topic was postponed. The date shown is not the original due date.    Fall Prevention in the Home Introduction Falls can cause injuries. They can happen to people of all ages. There are many things you can do to make your home safe and to help prevent falls. What can I do on the outside of my home?  Regularly fix the edges of walkways and driveways and fix any cracks.  Remove anything that might make you trip as you walk through a door, such as a raised step or threshold.  Trim any bushes or trees on the path to your home.  Use bright outdoor lighting.  Clear any walking paths of anything that might make someone trip, such as rocks or tools.  Regularly check to see if handrails are loose or broken. Make sure that both sides of any steps have handrails.  Any raised decks and porches should have guardrails on the edges.  Have any leaves, snow, or ice cleared regularly.  Use sand or salt on walking paths during winter.  Clean up any spills in your garage right away. This includes oil or grease spills. What can I do in the bathroom?  Use night lights.  Install grab bars by the toilet and in the tub and shower. Do not use towel bars as grab bars.  Use non-skid mats or decals in the tub or shower.  If you need to sit down in the shower, use a plastic, non-slip  stool.  Keep the floor dry. Clean up any water that spills on the floor as soon as it happens.  Remove soap buildup in the tub or shower regularly.  Attach bath mats securely with double-sided non-slip rug tape.  Do not have throw rugs and other things on the floor that can make you trip. What can I do in the bedroom?  Use night lights.  Make sure that you have a light by your bed that is easy to reach.  Do not use any sheets or blankets that are too big for your bed. They should not hang down onto the floor.  Have a firm chair that has side arms. You can use this for support while you get dressed.  Do not have throw rugs and other things on the floor that can make you trip. What can I do in the kitchen?  Clean up any spills right away.  Avoid walking on wet floors.  Keep items that you use a lot in easy-to-reach places.  If you need to reach something above you, use a strong step stool that has a grab bar.  Keep electrical cords out of the way.  Do not use floor polish or wax that makes floors slippery. If you must use wax, use non-skid floor wax.  Do not have throw rugs and other things on the floor that can make you trip. What can I do with my stairs?  Do not leave any items on the stairs.  Make sure that there are handrails on both sides of the stairs and use them. Fix handrails that are broken or loose. Make sure that handrails are as long as the stairways.  Check any carpeting to make sure that it is firmly attached to the stairs. Fix any carpet that is loose or worn.  Avoid having throw rugs at the top or bottom of the stairs. If you do have throw rugs, attach them to the floor with carpet tape.  Make sure that you have a light switch at the top of the stairs and the bottom of the stairs. If you do not have them, ask someone to add them for you. What else can I do to help prevent falls?  Wear shoes that:  Do not have high heels.  Have rubber bottoms.  Are  comfortable and fit you well.  Are closed at the toe. Do not wear sandals.  If you use a stepladder:  Make sure that it is fully opened. Do not climb a closed stepladder.  Make sure that both sides of the stepladder are locked into place.  Ask someone to hold it for you, if possible.  Clearly mark and make sure that you can see:  Any grab bars or handrails.  First and last steps.  Where the edge of each step is.  Use tools that help you move around (mobility aids) if they are needed. These include:  Canes.  Walkers.  Scooters.  Crutches.  Turn on the lights when you go into a dark area. Replace any light bulbs as soon as they burn out.  Set up your furniture so you have a clear path. Avoid moving your furniture around.  If any of your floors are uneven, fix them.  If there are any pets around you, be aware of where they are.  Review your medicines with your doctor. Some medicines can make you feel dizzy. This can increase your chance of falling. Ask your doctor what other things that you can do to help prevent falls. This information is not intended to replace advice given to you by your health care provider. Make sure you discuss any questions you have with your health care provider. Document Released: 05/14/2009 Document Revised: 12/24/2015 Document Reviewed: 08/22/2014  2017 Elsevier

## 2016-07-01 NOTE — Therapy (Signed)
[place] MAIN [facility] SERVICES [address] [place], [place], [address] Phone: [phone]   Fax:  [phone]  Physical Therapy Treatment  Patient Details  Name: Christy Cisneros MRN: [id number] Date of Birth: 1920/04/06 Referring Provider: Beverly Gust  Encounter Date: 07/01/2016      PT End of Session - 07/01/16 1024    Visit Number 8   Number of Visits 17   Date for PT Re-Evaluation 08/19/2016   Authorization Type g codes 8/10   PT Start Time 1012   PT Stop Time 1055   PT Time Calculation (min) 43 min   Equipment Utilized During Treatment Gait belt   Activity Tolerance Patient tolerated treatment well   Behavior During Therapy WFL for tasks assessed/performed      Past Medical History:  Diagnosis Date  . Barrett's esophagus with esophagitis   . Chronic bronchitis ([place])   . GERD (gastroesophageal reflux disease)   . Glaucoma   . Hypercholesteremia   . Osteoarthritis   . Osteoporosis   . Sleep apnea    has CPAP  . TIA (transient ischemic attack)     Past Surgical History:  Procedure Laterality Date  . BUNIONECTOMY     left  . ROTATOR CUFF REPAIR     right    There were no vitals filed for this visit.      Subjective Assessment - 07/01/16 1022    Subjective Patient states she feels she is doing well. Patient states she uses her hiking pole when she goes out to walk. Patient states she has not been having episodes of dizziness or "lurching" that she was expereincing before she started physical therapy.    Pertinent History Pt reports that she has been having trouble with her balance for an extended period of time. She staggers initially when she stands up and is wanting to improve this. She had a recent bout of vertigo where she had to come to [facility] ED on 04/15/16. Pt states that the symptoms occurred spontaneously while she was standing talking with a friend. She was discharged from the hospital with a diagnosis of  vertigo and a prescription for meclizine. She is not currently taking the meclizine and has had  no further bouts of vertigo. Pt currently denies dizziness. She states that she was sent to neurology and ENT and then referred to [facility] for vestibular therapy. ENT notes reviewed and state pt symptoms only occurred once but are better now. Pt is somewhat a difficulty historian and no family present to supplement. Friend is present but has no additional information as she is not involved with the patient's healthcare. Pt denies any other recent changes in her health.    Limitations Walking;Standing;House hold activities   Patient Stated Goals Pt would like to improve her balance   Currently in Pain? No/denies       Therapeutic Exercise:  Quantum leg press 95# x 10, 105# x 15, 105# x 10   Neuromuscular Re-education:  [place] tapping: On Airex surface, patient performed alternate toe tapping on 6" wooden step. Patient progressed from one hand support to no UEs support reaching only a few times to help correct losses of balance. Patient requiring CGA. Patient with posterior losses of balance but improved with practice.   2" X 4" board:   On 2" X 4" board worked on static sideways stance and then worked on sidestepping L/R 8' times multiple reps of each type with CGA to [place]. Patient  had difficulty with maintaining balance initially with posterior losses of balance with patient reaching for // bar to correct initially. With practice, patient was able to better begin to utilize hip and ankle strategies to help correct losses of balance.   Ambulation with head turns:   Patient performed about 500' forwards ambulation with hiking pole with horiz and vert head turns with CGA.  Patient with decrease gait deviations and losses of balance with this activity this date.   Patient denies dizziness throughout session.       PT Education - 07/01/16 1048    Education provided Yes   Education Details Discussed  importance of use of walking pole   Person(s) Educated Patient   Methods Explanation;Demonstration   Comprehension Verbalized understanding;Returned demonstration             PT Long Term Goals - 06/03/16 1222      PT LONG TERM GOAL #1   Title  Pt will be independent with HEP in order to improve strength and balance in order to decrease fall risk and improve function at home and work.    Time 8   Period Weeks   Status New     PT LONG TERM GOAL #2   Title  Pt will improve ABC by at least 13% in order to demonstrate clinically significant improvement in balance confidence.    Baseline 06/03/16: 29.69%   Time 6   Period Weeks   Status New     PT LONG TERM GOAL #3   Title Pt will improve DGI by at least 3 points in order to demonstrate clinically significant improvement in balance and decreased risk for falls    Baseline 06/03/16: 14/24   Time 8   Period Weeks   Status New               Plan - 07/01/16 1024    Clinical Impression Statement Patient primarily reliant on UEs reaching when she experiences lose of balance and would benefit from continued therapy to further work on developing better hip and ankle strategies. Patient motivated to session. Patient arrived to clinic using hiking pole and encouraged patient to continue to utilize pole as patient reports that she is only using it when she takes walks currently. Will plan on repeating functional outcome measures in the next few visits.    Rehab Potential Good   Clinical Impairments Affecting Rehab Potential (+) active, motivated  (-) age   PT Frequency 2x / week   PT Duration 8 weeks   PT Treatment/Interventions Patient/family education;Therapeutic exercise;Balance training;Aquatic Therapy;Canalith Repostioning;Electrical Stimulation;Iontophoresis 4mg /ml Dexamethasone;Moist Heat;Traction;Ultrasound;DME Instruction;Gait training;Therapeutic activities;Neuromuscular re-education;Cognitive remediation;Manual  techniques;Passive range of motion;Vestibular   PT Next Visit Plan Progress balance and strengthening; practice with hiking pole with head turns, leg press progression; activities to practice hip strategies and repeat functional outcome measures   PT Home Exercise Plan Heel/toe raises, single leg balance practice (slow marches), and semi-tandem balance with horizontal/vertical head turns;   Consulted and Agree with Plan of Care Patient      Patient will benefit from skilled therapeutic intervention in order to improve the following deficits and impairments:  Decreased balance, Decreased safety awareness, Abnormal gait, Decreased strength  Visit Diagnosis: Unsteadiness on feet  Muscle weakness (generalized)     Problem List Patient Active Problem List   Diagnosis Date Noted  . Mild cognitive impairment 05/15/2016  . Dizziness 04/21/2016  . Neck pain 02/11/2016  . Muscle cramps 11/14/2015  . Urine incontinence 08/16/2015  .  Hematoma 08/16/2015  . Fatigue 07/12/2015  . Headache 06/16/2015  . Unsteady gait 06/01/2015  . GERD (gastroesophageal reflux disease) 08/11/2014  . UTI (urinary tract infection) 08/07/2014  . DYSPNEA 04/17/2009  . HYPERCHOLESTEROLEMIA 04/16/2009  . Obstructive sleep apnea 04/16/2009  . Glaucoma 04/16/2009  . Transient cerebral ischemia 04/16/2009  . BARRETTS ESOPHAGUS 04/16/2009  . OSTEOARTHRITIS 04/16/2009  . Osteoporosis 04/16/2009   Lady Deutscher PT, DPT Lady Deutscher 07/01/2016, 11:28 AM  [place] MAIN [facility] SERVICES [address] [place], [place], [address] Phone: [phone]   Fax:  [phone]  Name: Christy Cisneros MRN: [id number] Date of Birth: [birth year]

## 2016-07-05 ENCOUNTER — Encounter: Payer: Self-pay | Admitting: Physical Therapy

## 2016-07-05 ENCOUNTER — Ambulatory Visit: Payer: Medicare Other | Admitting: Physical Therapy

## 2016-07-05 DIAGNOSIS — R2681 Unsteadiness on feet: Secondary | ICD-10-CM | POA: Diagnosis not present

## 2016-07-05 DIAGNOSIS — M6281 Muscle weakness (generalized): Secondary | ICD-10-CM | POA: Diagnosis not present

## 2016-07-05 NOTE — Therapy (Signed)
[place] MAIN [facility] SERVICES [address] [place], [place], [address] Phone: [phone]   Fax:  [phone]  Physical Therapy Treatment  Patient Details  Name: Christy Cisneros MRN: [id number] Date of Birth: [birth year] Referring Provider: Beverly Gust  Encounter Date: 07/05/2016      PT End of Session - 07/05/16 1313    Visit Number 9   Number of Visits 17   Date for PT Re-Evaluation 27-Aug-2016   Authorization Type g codes 05/10/23   PT Start Time 1309   PT Stop Time 1347   PT Time Calculation (min) 38 min   Equipment Utilized During Treatment Gait belt   Activity Tolerance Patient tolerated treatment well   Behavior During Therapy WFL for tasks assessed/performed      Past Medical History:  Diagnosis Date  . Barrett's esophagus with esophagitis   . Chronic bronchitis ([place])   . GERD (gastroesophageal reflux disease)   . Glaucoma   . Hypercholesteremia   . Osteoarthritis   . Osteoporosis   . Sleep apnea    has CPAP  . TIA (transient ischemic attack)     Past Surgical History:  Procedure Laterality Date  . BUNIONECTOMY     left  . ROTATOR CUFF REPAIR     right    There were no vitals filed for this visit.      Subjective Assessment - 07/05/16 1310    Subjective Pt arrived 10 minutes late, limiting session.  She has been completing her HEP ~2 days each week and says she has no excuse.  Pt arrived without using hiking poles.  Says she is using her hiking poles at all times but did not want to bring them here and lose them.     Pertinent History Pt reports that she has been having trouble with her balance for an extended period of time. She staggers initially when she stands up and is wanting to improve this. She had a recent bout of vertigo where she had to come to [facility] ED on 04/15/16. Pt states that the symptoms occurred spontaneously while she was standing talking with a friend. She was discharged from the hospital with a  diagnosis of vertigo and a prescription for meclizine. She is not currently taking the meclizine and has had  no further bouts of vertigo. Pt currently denies dizziness. She states that she was sent to neurology and ENT and then referred to [facility] for vestibular therapy. ENT notes reviewed and state pt symptoms only occurred once but are better now. Pt is somewhat a difficulty historian and no family present to supplement. Friend is present but has no additional information as she is not involved with the patient's healthcare. Pt denies any other recent changes in her health.    Limitations Walking;Standing;House hold activities   Patient Stated Goals Pt would like to improve her balance   Currently in Pain? No/denies        TREATMENT   Therapeutic Exercise:  Quantum leg press 105# 3x10  Patient performed 500 ft forwards ambulation with hiking pole with horiz and vert head turns with CGA. Cues for upright posture.  Forward lunges on BOSU ball x10 each LE with 1UE supported on // bar   Neuromuscular Rehab:  Standing on airex and toe tapping to balance stone as indicated by this PT x1 minute with each LE  Rockerboard ant/post and left/right x1 minute in each direction without UE support L/R but pt reaching out for //  occasionally with ant/post  Alternating toe tapping up to 8" step from airex 2x20  Walking on balance stones in // bars with 1UE support  Standing on airex in [place] and ball toss to L and then to R, x10 each side             PT Education - 07/05/16 1312    Education provided Yes   Education Details Exercise technique   Person(s) Educated Patient   Methods Explanation;Demonstration   Comprehension Verbalized understanding;Returned demonstration;Verbal cues required             PT Long Term Goals - 06/03/16 1222      PT LONG TERM GOAL #1   Title  Pt will be independent with HEP in order to improve strength and balance in order to decrease fall risk and  improve function at home and work.    Time 8   Period Weeks   Status New     PT LONG TERM GOAL #2   Title  Pt will improve ABC by at least 13% in order to demonstrate clinically significant improvement in balance confidence.    Baseline 06/03/16: 29.69%   Time 6   Period Weeks   Status New     PT LONG TERM GOAL #3   Title Pt will improve DGI by at least 3 points in order to demonstrate clinically significant improvement in balance and decreased risk for falls    Baseline 06/03/16: 14/24   Time 8   Period Weeks   Status New               Plan - 07/05/16 1341    Clinical Impression Statement Focused today on LE strengthening and balance with pt fatiguing at end of each set of strengthening exercise.  Cues provided today while ambulating of upright posture and forward gaze.  Pt encouraged to bring her hiking poles with her to PT and to continue using at all times.  Pt will benefit from continued skilled PT interventions for balance and strength.     Rehab Potential Good   Clinical Impairments Affecting Rehab Potential (+) active, motivated  (-) age   PT Frequency 2x / week   PT Duration 8 weeks   PT Treatment/Interventions Patient/family education;Therapeutic exercise;Balance training;Aquatic Therapy;Canalith Repostioning;Electrical Stimulation;Iontophoresis 4mg /ml Dexamethasone;Moist Heat;Traction;Ultrasound;DME Instruction;Gait training;Therapeutic activities;Neuromuscular re-education;Cognitive remediation;Manual techniques;Passive range of motion;Vestibular   PT Next Visit Plan Progress balance and strengthening; practice with hiking pole with head turns, leg press progression; activities to practice hip strategies and repeat functional outcome measures   PT Home Exercise Plan Heel/toe raises, single leg balance practice (slow marches), and semi-tandem balance with horizontal/vertical head turns;   Consulted and Agree with Plan of Care Patient      Patient will benefit from  skilled therapeutic intervention in order to improve the following deficits and impairments:  Decreased balance, Decreased safety awareness, Abnormal gait, Decreased strength  Visit Diagnosis: Unsteadiness on feet  Muscle weakness (generalized)     Problem List Patient Active Problem List   Diagnosis Date Noted  . Mild cognitive impairment 05/15/2016  . Dizziness 04/21/2016  . Neck pain 02/11/2016  . Muscle cramps 11/14/2015  . Urine incontinence 08/16/2015  . Hematoma 08/16/2015  . Fatigue 07/12/2015  . Headache 06/16/2015  . Unsteady gait 06/01/2015  . GERD (gastroesophageal reflux disease) 08/11/2014  . UTI (urinary tract infection) 08/07/2014  . DYSPNEA 04/17/2009  . HYPERCHOLESTEROLEMIA 04/16/2009  . Obstructive sleep apnea 04/16/2009  . Glaucoma 04/16/2009  .  Transient cerebral ischemia 04/16/2009  . BARRETTS ESOPHAGUS 04/16/2009  . OSTEOARTHRITIS 04/16/2009  . Osteoporosis 04/16/2009    Collie Siad PT, DPT 07/05/2016, 1:48 PM  [place] MAIN [facility] SERVICES [address] [place], [place], [address] Phone: [phone]   Fax:  [phone]  Name: TRENIYAH SPRATLING MRN: [id number] Date of Birth: 1919/08/08

## 2016-07-06 ENCOUNTER — Encounter: Payer: Medicare Other | Admitting: Physical Therapy

## 2016-07-07 ENCOUNTER — Ambulatory Visit: Payer: Medicare Other | Admitting: Physical Therapy

## 2016-07-07 ENCOUNTER — Encounter: Payer: Medicare Other | Admitting: Physical Therapy

## 2016-07-07 DIAGNOSIS — H47233 Glaucomatous optic atrophy, bilateral: Secondary | ICD-10-CM | POA: Diagnosis not present

## 2016-07-07 DIAGNOSIS — H524 Presbyopia: Secondary | ICD-10-CM | POA: Diagnosis not present

## 2016-07-07 DIAGNOSIS — H52221 Regular astigmatism, right eye: Secondary | ICD-10-CM | POA: Diagnosis not present

## 2016-07-07 DIAGNOSIS — H04123 Dry eye syndrome of bilateral lacrimal glands: Secondary | ICD-10-CM | POA: Diagnosis not present

## 2016-07-07 DIAGNOSIS — H353132 Nonexudative age-related macular degeneration, bilateral, intermediate dry stage: Secondary | ICD-10-CM | POA: Diagnosis not present

## 2016-07-07 DIAGNOSIS — H401113 Primary open-angle glaucoma, right eye, severe stage: Secondary | ICD-10-CM | POA: Diagnosis not present

## 2016-07-07 DIAGNOSIS — H5211 Myopia, right eye: Secondary | ICD-10-CM | POA: Diagnosis not present

## 2016-07-07 DIAGNOSIS — H35363 Drusen (degenerative) of macula, bilateral: Secondary | ICD-10-CM | POA: Diagnosis not present

## 2016-07-07 DIAGNOSIS — H401123 Primary open-angle glaucoma, left eye, severe stage: Secondary | ICD-10-CM | POA: Diagnosis not present

## 2016-07-07 DIAGNOSIS — S0502XD Injury of conjunctiva and corneal abrasion without foreign body, left eye, subsequent encounter: Secondary | ICD-10-CM | POA: Diagnosis not present

## 2016-07-08 ENCOUNTER — Encounter: Payer: Medicare Other | Admitting: Physical Therapy

## 2016-07-11 ENCOUNTER — Other Ambulatory Visit: Payer: Self-pay | Admitting: Family

## 2016-07-11 ENCOUNTER — Telehealth: Payer: Self-pay | Admitting: Internal Medicine

## 2016-07-11 ENCOUNTER — Encounter: Payer: Self-pay | Admitting: Family

## 2016-07-11 ENCOUNTER — Ambulatory Visit (INDEPENDENT_AMBULATORY_CARE_PROVIDER_SITE_OTHER): Payer: Medicare Other

## 2016-07-11 ENCOUNTER — Ambulatory Visit (INDEPENDENT_AMBULATORY_CARE_PROVIDER_SITE_OTHER): Payer: Medicare Other | Admitting: Family

## 2016-07-11 VITALS — BP 124/82 | HR 87 | Temp 98.0°F | Resp 18 | Wt 101.2 lb

## 2016-07-11 DIAGNOSIS — J209 Acute bronchitis, unspecified: Secondary | ICD-10-CM | POA: Diagnosis not present

## 2016-07-11 DIAGNOSIS — R05 Cough: Secondary | ICD-10-CM | POA: Diagnosis not present

## 2016-07-11 DIAGNOSIS — J181 Lobar pneumonia, unspecified organism: Principal | ICD-10-CM

## 2016-07-11 DIAGNOSIS — J189 Pneumonia, unspecified organism: Secondary | ICD-10-CM

## 2016-07-11 MED ORDER — BENZONATATE 100 MG PO CAPS: 100.0000 mg | ORAL_CAPSULE | Freq: Two times a day (BID) | ORAL | 0 refills | Status: DC | PRN

## 2016-07-11 MED ORDER — ALBUTEROL SULFATE (2.5 MG/3ML) 0.083% IN NEBU: 2.5000 mg | INHALATION_SOLUTION | Freq: Once | RESPIRATORY_TRACT | Status: DC

## 2016-07-11 MED ORDER — AZITHROMYCIN 250 MG PO TABS: ORAL_TABLET | ORAL | 0 refills | Status: DC

## 2016-07-11 NOTE — Patient Instructions (Signed)
Chest Xray   Tessalon perles for cough as needed  Increase intake of clear fluids. Congestion is best treated by hydration, when mucus is wetter, it is thinner, less sticky, and easier to expel from the body, either through coughing up drainage, or by blowing your nose.   Get plenty of rest.   Use saline nasal drops and blow your nose frequently. Run a humidifier at night and elevate the head of the bed. Vicks Vapor rub will help with congestion and cough. Steam showers and sinus massage for congestion.    You can also try a teaspoon of honey to see if this will help reduce cough. Throat lozenges can sometimes be beneficial as well.    This illness will typically last 7 - 10 days.   Please follow up with our clinic if you develop a fever greater than 101 F, symptoms worsen, or do not resolve in the next week.

## 2016-07-11 NOTE — Progress Notes (Signed)
Spoke with pt about CXR and abx. Pt will f/u 2 days.

## 2016-07-11 NOTE — Progress Notes (Signed)
Subjective:    Patient ID: Christy Cisneros, female    DOB: 1919-08-21, 82 y.o.   MRN: [id number]  CC: Christy Cisneros is a 82 y.o. female who presents today for an acute visit.    HPI: CC: sore throat, dry cough X 2 weeks, unchanged. Hasn't tried any medications. No orthopnea, LE swelling. No fever, chills, SOB, wheezing.    No lung disease history. No history of smoking.     HISTORY:  Past Medical History:  Diagnosis Date  . Barrett's esophagus with esophagitis   . Chronic bronchitis ([place])   . GERD (gastroesophageal reflux disease)   . Glaucoma   . Hypercholesteremia   . Osteoarthritis   . Osteoporosis   . Sleep apnea    has CPAP  . TIA (transient ischemic attack)    Past Surgical History:  Procedure Laterality Date  . BUNIONECTOMY     left  . ROTATOR CUFF REPAIR     right   Family History  Problem Relation Age of Onset  . Breast cancer Sister   . Heart disease Mother     Allergies: Alphagan [brimonidine]; Avelox [moxifloxacin hcl in nacl]; Bacitracin; Cefdinir; Cephalexin; Clarithromycin; Codeine; Erythromycin; Hydrocodone-acetaminophen; Lansoprazole; Polymyxin b; and Vigamox [moxifloxacin] Current Outpatient Prescriptions on File Prior to Visit  Medication Sig Dispense Refill  . Aloe LIQD Take by mouth daily.     Marland Kitchen aspirin 81 MG tablet Take 81 mg by mouth daily.    . Bilberry, Vaccinium myrtillus, (BILBERRY PO) Take 1 capsule by mouth daily.    . calcium carbonate (OS-CAL) 600 MG TABS Take 600 mg by mouth daily.    . dorzolamide-timolol (COSOPT) 22.3-6.8 MG/ML ophthalmic solution Apply to eye. Reported on 02/11/2016    . erythromycin ophthalmic ointment Reported on 02/11/2016    . Flaxseed, Linseed, (GROUND FLAX SEEDS PO) Take by mouth daily.     Marland Kitchen latanoprost (XALATAN) 0.005 % ophthalmic solution Reported on 02/11/2016    . meclizine (ANTIVERT) 25 MG tablet Take 0.5 tablets (12.5 mg total) by mouth 3 (three) times daily as needed. 10 tablet 0  .  Multiple Vitamins-Minerals (PRESERVISION AREDS 2 PO) Take by mouth 2 (two) times daily.    Marland Kitchen omeprazole (PRILOSEC) 20 MG capsule     . prednisoLONE acetate (PRED FORTE) 1 % ophthalmic suspension Place 1 drop into the left eye 2 (two) times daily.    . timolol (BETIMOL) 0.5 % ophthalmic solution Place 1 drop into the right eye daily.     No current facility-administered medications on file prior to visit.     Social History  Substance Use Topics  . Smoking status: Never Smoker  . Smokeless tobacco: Never Used  . Alcohol use 0.0 oz/week     Comment: wine/brandy occasional    Review of Systems  Constitutional: Negative for chills and fever.  HENT: Positive for sore throat and voice change. Negative for congestion and sinus pressure.   Respiratory: Positive for cough. Negative for shortness of breath and wheezing.   Cardiovascular: Negative for chest pain and palpitations.  Gastrointestinal: Negative for nausea and vomiting.      Objective:    BP 124/82 (BP Location: Left Arm, Patient Position: Sitting, Cuff Size: Normal)   Pulse 87   Temp 98 F (36.7 C) (Oral)   Resp 18   Wt 101 lb 4 oz (45.9 kg)   SpO2 95%   BMI 20.45 kg/m    Physical Exam  Constitutional: She appears well-developed and well-nourished.  HENT:  Head: Normocephalic and atraumatic.  Right Ear: Hearing, tympanic membrane, external ear and ear canal normal. No drainage, swelling or tenderness. No foreign bodies. Tympanic membrane is not erythematous and not bulging. No middle ear effusion. No decreased hearing is noted.  Left Ear: Hearing, tympanic membrane, external ear and ear canal normal. No drainage, swelling or tenderness. No foreign bodies. Tympanic membrane is not erythematous and not bulging.  No middle ear effusion. No decreased hearing is noted.  Nose: Nose normal. No rhinorrhea. Right sinus exhibits no maxillary sinus tenderness and no frontal sinus tenderness. Left sinus exhibits no maxillary sinus  tenderness and no frontal sinus tenderness.  Mouth/Throat: Uvula is midline, oropharynx is clear and moist and mucous membranes are normal. No oropharyngeal exudate, posterior oropharyngeal edema, posterior oropharyngeal erythema or tonsillar abscesses.  Eyes: Conjunctivae are normal.  Cardiovascular: Regular rhythm, normal heart sounds and normal pulses.   No LE swelling  Pulmonary/Chest: Effort normal. She has decreased breath sounds in the right lower field and the left lower field. She has no wheezes. She has no rhonchi. She has no rales.  Lymphadenopathy:       Head (right side): No submental, no submandibular, no tonsillar, no preauricular, no posterior auricular and no occipital adenopathy present.       Head (left side): No submental, no submandibular, no tonsillar, no preauricular, no posterior auricular and no occipital adenopathy present.    She has no cervical adenopathy.  Neurological: She is alert.  Skin: Skin is warm and dry.  Psychiatric: She has a normal mood and affect. Her speech is normal and behavior is normal. Thought content normal.  Vitals reviewed. Patient felt significantly better after albuterol treatment. Lung sounds clear and increased      Assessment & Plan:   1. Acute bronchitis, unspecified organism Afebrile. SaO2 95. Reassured lungs sounds improved after nebulizer treatment. Pending chest x-ray to ensure no underlying bacterial infection. Patient and I jointly agreed on conservative management at this time with as needed cough medication. Return precautions given. - DG Chest 2 View - albuterol (PROVENTIL) (2.5 MG/3ML) 0.083% nebulizer solution 2.5 mg; Take 3 mLs (2.5 mg total) by nebulization once. - benzonatate (TESSALON) 100 MG capsule; Take 1 capsule (100 mg total) by mouth 2 (two) times daily as needed for cough.  Dispense: 20 capsule; Refill: 0    I am having Ms. Bransfield start on benzonatate. I am also having her maintain her prednisoLONE acetate,  timolol, calcium carbonate, (Bilberry, Vaccinium myrtillus, (BILBERRY PO)), aspirin, Multiple Vitamins-Minerals (PRESERVISION AREDS 2 PO), (Flaxseed, Linseed, (GROUND FLAX SEEDS PO)), Aloe, erythromycin, latanoprost, dorzolamide-timolol, omeprazole, and meclizine. We will continue to administer albuterol.   Meds ordered this encounter  Medications  . albuterol (PROVENTIL) (2.5 MG/3ML) 0.083% nebulizer solution 2.5 mg  . benzonatate (TESSALON) 100 MG capsule    Sig: Take 1 capsule (100 mg total) by mouth 2 (two) times daily as needed for cough.    Dispense:  20 capsule    Refill:  0    Order Specific Question:   Supervising Provider    Answer:   Crecencio Mc [[id number]]    Return precautions given.   Risks, benefits, and alternatives of the medications and treatment plan prescribed today were discussed, and patient expressed understanding.   Education regarding symptom management and diagnosis given to patient on AVS.  Continue to follow with Einar Pheasant, MD for routine health maintenance.   [name] and I agreed with plan.   Joycelyn Schmid  Vidal Schwalbe, FNP

## 2016-07-11 NOTE — Progress Notes (Signed)
Pre visit review using our clinic review tool, if applicable. No additional management support is needed unless otherwise documented below in the visit note. 

## 2016-07-12 ENCOUNTER — Ambulatory Visit: Payer: Medicare Other | Admitting: Physical Therapy

## 2016-07-12 ENCOUNTER — Emergency Department
Admission: EM | Admit: 2016-07-12 | Discharge: 2016-07-12 | Disposition: A | Discharge status: Home / Self Care | Payer: Medicare Other | Attending: Emergency Medicine | Admitting: Emergency Medicine

## 2016-07-12 ENCOUNTER — Encounter: Payer: Self-pay | Admitting: Emergency Medicine

## 2016-07-12 ENCOUNTER — Emergency Department: Payer: Medicare Other

## 2016-07-12 DIAGNOSIS — Z79899 Other long term (current) drug therapy: Secondary | ICD-10-CM | POA: Insufficient documentation

## 2016-07-12 DIAGNOSIS — J189 Pneumonia, unspecified organism: Secondary | ICD-10-CM

## 2016-07-12 DIAGNOSIS — J181 Lobar pneumonia, unspecified organism: Secondary | ICD-10-CM | POA: Insufficient documentation

## 2016-07-12 DIAGNOSIS — R1310 Dysphagia, unspecified: Secondary | ICD-10-CM

## 2016-07-12 DIAGNOSIS — R05 Cough: Secondary | ICD-10-CM

## 2016-07-12 DIAGNOSIS — Z7982 Long term (current) use of aspirin: Secondary | ICD-10-CM | POA: Diagnosis not present

## 2016-07-12 DIAGNOSIS — R070 Pain in throat: Secondary | ICD-10-CM

## 2016-07-12 DIAGNOSIS — R059 Cough, unspecified: Secondary | ICD-10-CM

## 2016-07-12 MED ORDER — AZITHROMYCIN 250 MG PO TABS: ORAL_TABLET | ORAL | 0 refills | Status: AC

## 2016-07-12 MED ORDER — DEXAMETHASONE 10 MG/ML FOR PEDIATRIC ORAL USE
10.0000 mg | Freq: Once | INTRAMUSCULAR | Status: AC
  Administered 2016-07-12: 10 mg via ORAL
  Filled 2016-07-12: qty 1

## 2016-07-12 NOTE — ED Notes (Signed)
PT ambulated to the toilet in room.

## 2016-07-12 NOTE — ED Triage Notes (Addendum)
Pt arrived via ems from home with complaints of a persistent cough lasting 3 weeks. Pt also reports a sore throat. Per pt she was seen on Friday but was disappointed with the report. Pt states she woke up tonight feeling worse stating "I might have pneumonia."

## 2016-07-12 NOTE — ED Provider Notes (Signed)
[facility] Emergency Department Provider Note   ____________________________________________   First MD Initiated Contact with Patient 07/12/16 [phone]     (approximate)  I have reviewed the triage vital signs and the nursing notes.   HISTORY  Chief Complaint Cough and Sore Throat    HPI Christy Cisneros is a 82 y.o. female who comes into the hospital today with a cough and difficulty swallowing. The patient reports that she woke up this morning around 2:30 AM and felt as though she couldn't swallow. The patient tries to drink some more milk and thought that there was a blockage in her throat. She was concerned so she decided to come in for an evaluation. The patient had an x-ray done yesterday She is been having a cough for 2 weeks but she reports that she was told everything was fine. The patient reports that her friend had listened to her cough and told her she should be seen. She reports that she had some pain in her throat and it scared her but is better now. The patient had some phlegm at the time. The patient reports that she had no chest pain no nausea no vomiting and no shortness of breath. She reports that she hasn't tried to swallow since she's arrived so she is sure if she is able to. The patient is here for evaluation.   Past Medical History:  Diagnosis Date  . Barrett's esophagus with esophagitis   . Chronic bronchitis ([place])   . GERD (gastroesophageal reflux disease)   . Glaucoma   . Hypercholesteremia   . Osteoarthritis   . Osteoporosis   . Sleep apnea    has CPAP  . TIA (transient ischemic attack)     Patient Active Problem List   Diagnosis Date Noted  . Mild cognitive impairment 05/15/2016  . Dizziness 04/21/2016  . Neck pain 02/11/2016  . Muscle cramps 11/14/2015  . Urine incontinence 08/16/2015  . Hematoma 08/16/2015  . Fatigue 07/12/2015  . Headache 06/16/2015  . Unsteady gait 06/01/2015  . GERD (gastroesophageal reflux  disease) 08/11/2014  . UTI (urinary tract infection) 08/07/2014  . DYSPNEA 04/17/2009  . HYPERCHOLESTEROLEMIA 04/16/2009  . Obstructive sleep apnea 04/16/2009  . Glaucoma 04/16/2009  . Transient cerebral ischemia 04/16/2009  . BARRETTS ESOPHAGUS 04/16/2009  . OSTEOARTHRITIS 04/16/2009  . Osteoporosis 04/16/2009    Past Surgical History:  Procedure Laterality Date  . BUNIONECTOMY     left  . ROTATOR CUFF REPAIR     right    Prior to Admission medications   Medication Sig Start Date End Date Taking? Authorizing Provider  Aloe LIQD Take by mouth daily.     Historical Provider, MD  aspirin 81 MG tablet Take 81 mg by mouth daily.    Historical Provider, MD  azithromycin (ZITHROMAX Z-PAK) 250 MG tablet Take 2 tablets (500 mg) on  Day 1,  followed by 1 tablet (250 mg) once daily on Days 2 through 5. 07/12/16 07/17/16  Loney Hering, MD  azithromycin (ZITHROMAX) 250 MG tablet Take 2 tablets ( total 500 mg) PO on day 1, then take 1 tablet ( total 250 mg) by mouth q24h x 4 days. 07/11/16   Burnard Hawthorne, FNP  benzonatate (TESSALON) 100 MG capsule Take 1 capsule (100 mg total) by mouth 2 (two) times daily as needed for cough. 07/11/16   Burnard Hawthorne, FNP  Bilberry, Vaccinium myrtillus, (BILBERRY PO) Take 1 capsule by mouth daily.    Historical Provider,  MD  calcium carbonate (OS-CAL) 600 MG TABS Take 600 mg by mouth daily.    Historical Provider, MD  dorzolamide-timolol (COSOPT) 22.3-6.8 MG/ML ophthalmic solution Apply to eye. Reported on 02/11/2016 12/10/14   Historical Provider, MD  erythromycin ophthalmic ointment Reported on 02/11/2016 12/08/14   Historical Provider, MD  Flaxseed, Linseed, (GROUND FLAX SEEDS PO) Take by mouth daily.     Historical Provider, MD  latanoprost (XALATAN) 0.005 % ophthalmic solution Reported on 02/11/2016 12/10/14   Historical Provider, MD  meclizine (ANTIVERT) 25 MG tablet Take 0.5 tablets (12.5 mg total) by mouth 3 (three) times daily as needed. 04/15/16    Schuyler Amor, MD  Multiple Vitamins-Minerals (PRESERVISION AREDS 2 PO) Take by mouth 2 (two) times daily.    Historical Provider, MD  omeprazole (PRILOSEC) 20 MG capsule  05/12/15   Historical Provider, MD  prednisoLONE acetate (PRED FORTE) 1 % ophthalmic suspension Place 1 drop into the left eye 2 (two) times daily.    Historical Provider, MD  timolol (BETIMOL) 0.5 % ophthalmic solution Place 1 drop into the right eye daily.    Historical Provider, MD    Allergies Alphagan [brimonidine]; Avelox [moxifloxacin hcl in nacl]; Bacitracin; Cefdinir; Cephalexin; Clarithromycin; Codeine; Erythromycin; Hydrocodone-acetaminophen; Lansoprazole; Polymyxin b; and Vigamox [moxifloxacin]  Family History  Problem Relation Age of Onset  . Breast cancer Sister   . Heart disease Mother     Social History Social History  Substance Use Topics  . Smoking status: Never Smoker  . Smokeless tobacco: Never Used  . Alcohol use 0.0 oz/week     Comment: wine/brandy occasional    Review of Systems Constitutional: No fever/chills Eyes: No visual changes. ENT: Difficulty swallowing and sore throat. Cardiovascular: Denies chest pain. Respiratory: Cough Gastrointestinal: No abdominal pain.  No nausea, no vomiting.  No diarrhea.  No constipation. Genitourinary: Negative for dysuria. Musculoskeletal: Negative for back pain. Skin: Negative for rash. Neurological: Negative for headaches, focal weakness or numbness.  10-point ROS otherwise negative.  ____________________________________________   PHYSICAL EXAM:  VITAL SIGNS: ED Triage Vitals  Enc Vitals Group     BP 07/12/16 0409 (!) 157/95     Pulse Rate 07/12/16 0409 86     Resp 07/12/16 0409 18     Temp 07/12/16 0409 97.5 F (36.4 C)     Temp Source 07/12/16 0409 Oral     SpO2 07/12/16 0409 97 %     Weight 07/12/16 0410 100 lb (45.4 kg)     Height 07/12/16 0410 4\' 11"  (1.499 m)     Head Circumference --      Peak Flow --      Pain Score  07/12/16 0410 9     Pain Loc --      Pain Edu? --      Excl. in [place]? --     Constitutional: Alert and oriented. Well appearing and in Mild distress. Eyes: Conjunctivae are normal. PERRL. EOMI. Head: Atraumatic. Nose: No congestion/rhinnorhea. Mouth/Throat: Mucous membranes are moist.  Oropharynx non-erythematous. Cardiovascular: Normal rate, regular rhythm. Grossly normal heart sounds.  Good peripheral circulation. Respiratory: Normal respiratory effort.  No retractions. Lungs CTAB. Gastrointestinal: Soft and nontender. No distention. Positive bowel sounds Musculoskeletal: No lower extremity tenderness nor edema.   Neurologic:  Normal speech and language.  Skin:  Skin is warm, dry and intact.  Psychiatric: Mood and affect are normal.   ____________________________________________   LABS (all labs ordered are listed, but only abnormal results are displayed)  Labs Reviewed -  No data to display ____________________________________________  EKG  none ____________________________________________  RADIOLOGY  none ____________________________________________   PROCEDURES  Procedure(s) performed: None  Procedures  Critical Care performed: No  ____________________________________________   INITIAL IMPRESSION / ASSESSMENT AND PLAN / ED COURSE  Pertinent labs & imaging results that were available during my care of the patient were reviewed by me and considered in my medical decision making (see chart for details).  This is a 82 year old female who comes into the hospital today with some sore throat and difficulty swallowing. The patient had been having a cough for the past 2 weeks and was seen by her doctors office yesterday. We initially tried to send the patient for an x-ray but she reports that she had a really had one yesterday. I did give the patient some dexamethasone to see if that would help with her swallowing and she was able to drink it without any  difficulty.  Clinical Course    I looked at the results of the patient's x-ray and it showed some subtle densities in the left lung base. It could represent a small focus of infection. Given these results I will give the patient a dose of azithromycin. I will discharge the patient home and have her follow back up with the primary care physician. The patient is a further complaints or concerns and she feels well at this time. She'll be discharged home.  ____________________________________________   FINAL CLINICAL IMPRESSION(S) / ED DIAGNOSES  Final diagnoses:  Community acquired pneumonia of left lower lobe of lung ([place])  Dysphagia, unspecified type  Throat pain in adult  Cough      NEW MEDICATIONS STARTED DURING THIS VISIT:  New Prescriptions   AZITHROMYCIN (ZITHROMAX Z-PAK) 250 MG TABLET    Take 2 tablets (500 mg) on  Day 1,  followed by 1 tablet (250 mg) once daily on Days 2 through 5.     Note:  This document was prepared using Dragon voice recognition software and may include unintentional dictation errors.    Loney Hering, MD 07/12/16 [phone]

## 2016-07-13 ENCOUNTER — Encounter: Payer: Medicare Other | Admitting: Physical Therapy

## 2016-07-14 ENCOUNTER — Ambulatory Visit: Payer: Medicare Other | Admitting: Physical Therapy

## 2016-07-15 ENCOUNTER — Encounter: Payer: Medicare Other | Admitting: Physical Therapy

## 2016-07-15 ENCOUNTER — Telehealth: Payer: Self-pay

## 2016-07-15 ENCOUNTER — Telehealth: Payer: Self-pay | Admitting: *Deleted

## 2016-07-15 ENCOUNTER — Encounter: Payer: Self-pay | Admitting: Internal Medicine

## 2016-07-15 ENCOUNTER — Ambulatory Visit (INDEPENDENT_AMBULATORY_CARE_PROVIDER_SITE_OTHER): Payer: Medicare Other | Admitting: Internal Medicine

## 2016-07-15 DIAGNOSIS — J181 Lobar pneumonia, unspecified organism: Secondary | ICD-10-CM | POA: Diagnosis not present

## 2016-07-15 DIAGNOSIS — J189 Pneumonia, unspecified organism: Secondary | ICD-10-CM

## 2016-07-15 NOTE — Telephone Encounter (Signed)
Pt was scheduled on 12/22 approved by Dr. Nicki Reaper As advised to return in 1 wk

## 2016-07-15 NOTE — Progress Notes (Signed)
Pre visit review using our clinic review tool, if applicable. No additional management support is needed unless otherwise documented below in the visit note. 

## 2016-07-15 NOTE — Telephone Encounter (Signed)
Please call patient

## 2016-07-15 NOTE — Progress Notes (Signed)
Patient ID: Christy Cisneros, female   DOB: April 02, 1920, 82 y.o.   MRN: [id number]   Subjective:    Patient ID: Christy Cisneros, female    DOB: 1920/02/05, 82 y.o.   MRN: [id number]  HPI  Patient here for ER follow up.  She is accompanied by her friend.  History obtained from both of them.  Was diagnosed with pneumonia.  Was placed on azithromycin. She has one more day left.  Does feel better.  Still with some cough.  She is eating better now.  No nausea or vomiting.  Bowels stable.  No abdominal pain.  No sob.    Past Medical History:  Diagnosis Date  . Barrett's esophagus with esophagitis   . Chronic bronchitis ([place])   . GERD (gastroesophageal reflux disease)   . Glaucoma   . Hypercholesteremia   . Osteoarthritis   . Osteoporosis   . Sleep apnea    has CPAP  . TIA (transient ischemic attack)    Past Surgical History:  Procedure Laterality Date  . BUNIONECTOMY     left  . ROTATOR CUFF REPAIR     right   Family History  Problem Relation Age of Onset  . Breast cancer Sister   . Heart disease Mother    Social History   Social History  . Marital status: Single    Spouse name: N/A  . Number of children: N/A  . Years of education: N/A   Social History Main Topics  . Smoking status: Never Smoker  . Smokeless tobacco: Never Used  . Alcohol use 0.0 oz/week     Comment: wine/brandy occasional  . Drug use: No  . Sexual activity: No   Other Topics Concern  . None   Social History Narrative  . None     Review of Systems  Constitutional: Negative for fever.       Eating better now.    HENT: Positive for congestion. Negative for sinus pressure.   Respiratory: Positive for cough. Negative for chest tightness and shortness of breath.   Cardiovascular: Negative for chest pain, palpitations and leg swelling.  Gastrointestinal: Negative for abdominal pain, diarrhea, nausea and vomiting.  Musculoskeletal: Negative for back pain and joint swelling.  Skin: Negative  for color change and rash.  Neurological: Negative for dizziness, light-headedness and headaches.       Objective:    Physical Exam  Constitutional: She appears well-developed and well-nourished. No distress.  HENT:  Nose: Nose normal.  Mouth/Throat: Oropharynx is clear and moist.  No sinus tenderness to palpation.   Neck: Neck supple. No thyromegaly present.  Cardiovascular: Normal rate and regular rhythm.   Pulmonary/Chest: Breath sounds normal. No respiratory distress. She has no wheezes.  Abdominal: Soft. Bowel sounds are normal. There is no tenderness.  Musculoskeletal: She exhibits no edema or tenderness.  Lymphadenopathy:    She has no cervical adenopathy.  Skin: No rash noted. No erythema.  Psychiatric: She has a normal mood and affect. Her behavior is normal.    BP (!) 158/92   Pulse 83   Temp 98.2 F (36.8 C) (Oral)   Ht 4\' 11"  (1.499 m)   Wt 98 lb 9.6 oz (44.7 kg)   SpO2 93%   BMI 19.91 kg/m  Wt Readings from Last 3 Encounters:  07/15/16 98 lb 9.6 oz (44.7 kg)  07/12/16 100 lb (45.4 kg)  07/11/16 101 lb 4 oz (45.9 kg)     Lab Results  Component Value Date  WBC 6.8 04/15/2016   HGB 13.8 04/15/2016   HCT 41.3 04/15/2016   PLT 264 04/15/2016   GLUCOSE 114 (H) 04/15/2016   CHOL 220 (H) 06/16/2015   TRIG 64.0 06/16/2015   HDL 82.20 06/16/2015   LDLCALC 125 (H) 06/16/2015   ALT 13 11/12/2015   AST 19 11/12/2015   NA 139 04/15/2016   K 3.4 (L) 04/15/2016   CL 104 04/15/2016   CREATININE 0.79 04/15/2016   BUN 20 04/15/2016   CO2 28 04/15/2016   TSH 1.13 06/16/2015       Assessment & Plan:   Problem List Items Addressed This Visit    Pneumonia    Diagnosed with pneumonia.  Treated with azithromycin.  Has one more day left.  Cough is better.  Can take robitussin.  Stay hydrated.  Follow.  Rest. Fluids.  See back in one week.  Will need f/u cxr.            Einar Pheasant, MD

## 2016-07-15 NOTE — Telephone Encounter (Signed)
Patient calls states when she went to lay down for a nap started coughing. Per Dr Nicki Reaper she should have some benzonatate 100 mg capsules prescribed at hospital to take .  Patient unable to locate Benzonatate and unclear about meclizine what for .  Please clarify.

## 2016-07-15 NOTE — Telephone Encounter (Signed)
The meclizine is for dizziness.  That is an old medication.  She was not having any dizziness today.  If she does not have the tessalon perles, then I recommend robitussin

## 2016-07-16 NOTE — Telephone Encounter (Signed)
Called pt 07/15/16 and discussed her medication with her.  Explained her meclizine was for a previous problem she had with dizziness.  She was instructed she could use robitussin for cough.  She has some at her home so will start using.  Explained azithromycin was her abx and she should complete this medication.  Has a f/u appt with me next week.  Call or be evaluated if problems.

## 2016-07-17 ENCOUNTER — Encounter: Payer: Self-pay | Admitting: Internal Medicine

## 2016-07-17 DIAGNOSIS — J189 Pneumonia, unspecified organism: Secondary | ICD-10-CM | POA: Insufficient documentation

## 2016-07-17 NOTE — Assessment & Plan Note (Signed)
Diagnosed with pneumonia.  Treated with azithromycin.  Has one more day left.  Cough is better.  Can take robitussin.  Stay hydrated.  Follow.  Rest. Fluids.  See back in one week.  Will need f/u cxr.

## 2016-07-18 ENCOUNTER — Encounter: Payer: Medicare Other | Admitting: Physical Therapy

## 2016-07-18 ENCOUNTER — Ambulatory Visit: Payer: Medicare Other | Admitting: Physical Therapy

## 2016-07-19 ENCOUNTER — Telehealth: Payer: Self-pay | Admitting: *Deleted

## 2016-07-19 NOTE — Telephone Encounter (Signed)
Pt called back returning your call.   Call pt @ [phone]

## 2016-07-19 NOTE — Telephone Encounter (Signed)
Left message to call.

## 2016-07-19 NOTE — Telephone Encounter (Signed)
Patient advised go [employer] in clinic given address and hours.

## 2016-07-19 NOTE — Telephone Encounter (Signed)
Having sore throat, sore under neck , congestion, coughing again. Denies shortness of breath.  Fatigue.   She stated threw all medicines away. Has appointment on Friday.     Would like to come in sooner please advise.

## 2016-07-19 NOTE — Telephone Encounter (Signed)
With the meeting tomorrow and me not being here, I do not have an opening before Friday.  If increased problems and worsening symptoms, to [facility] or [facility] for further evaluation today and then I can f/u after.

## 2016-07-19 NOTE — Telephone Encounter (Signed)
Pt stated that she's not feeling well or getting any better. She requested a sooner appt other than Friday  Pt contact [phone] Pt has a cough and fatigue

## 2016-07-20 ENCOUNTER — Encounter: Payer: Medicare Other | Admitting: Physical Therapy

## 2016-07-20 ENCOUNTER — Ambulatory Visit: Payer: Medicare Other | Admitting: Physical Therapy

## 2016-07-20 DIAGNOSIS — H6123 Impacted cerumen, bilateral: Secondary | ICD-10-CM | POA: Diagnosis not present

## 2016-07-20 DIAGNOSIS — H903 Sensorineural hearing loss, bilateral: Secondary | ICD-10-CM | POA: Diagnosis not present

## 2016-07-22 ENCOUNTER — Encounter: Payer: Self-pay | Admitting: Internal Medicine

## 2016-07-22 ENCOUNTER — Ambulatory Visit (INDEPENDENT_AMBULATORY_CARE_PROVIDER_SITE_OTHER): Payer: Medicare Other | Admitting: Internal Medicine

## 2016-07-22 VITALS — BP 132/78 | HR 81 | Temp 97.8°F | Ht 59.0 in | Wt 102.0 lb

## 2016-07-22 DIAGNOSIS — J329 Chronic sinusitis, unspecified: Secondary | ICD-10-CM | POA: Diagnosis not present

## 2016-07-22 DIAGNOSIS — J189 Pneumonia, unspecified organism: Secondary | ICD-10-CM

## 2016-07-22 DIAGNOSIS — J181 Lobar pneumonia, unspecified organism: Secondary | ICD-10-CM

## 2016-07-22 DIAGNOSIS — K2100 Gastro-esophageal reflux disease with esophagitis, without bleeding: Secondary | ICD-10-CM

## 2016-07-22 MED ORDER — FLUTICASONE PROPIONATE 50 MCG/ACT NA SUSP: 2.0000 | Freq: Every day | NASAL | 0 refills | Status: DC

## 2016-07-22 MED ORDER — AMOXICILLIN 500 MG PO CAPS: 500.0000 mg | ORAL_CAPSULE | Freq: Three times a day (TID) | ORAL | 0 refills | Status: DC

## 2016-07-22 NOTE — Patient Instructions (Addendum)
Saline nasal spray - flush nose at least 2-3x/day  flonase nasal spray - 2 sprays each nostril one time per day.  Do this in the evening.    Robitussin twice a day as needed.    Take amoxicillin 500mg  three times per day  Take a probiotic daily while you are on the antibiotics and for two weeks after completing the antibiotics.  (align is an example of an over the counter probiotic).

## 2016-07-22 NOTE — Progress Notes (Signed)
Pre visit review using our clinic review tool, if applicable. No additional management support is needed unless otherwise documented below in the visit note. 

## 2016-07-22 NOTE — Progress Notes (Signed)
Patient ID: Christy Cisneros, female   DOB: 08-31-1919, 82 y.o.   MRN: [id number]   Subjective:    Patient ID: Christy Cisneros, female    DOB: Jul 22, 1920, 82 y.o.   MRN: [id number]  HPI  Patient here for a scheduled follow up.  Was recently evaluated for f/u of ER visit - pneumonia.  Was treated with zpak.  The cough and congestion are better.  She has since developed increased nasal congestion and sinus pressure.  Symptoms persistent and worsening.  No documented fever.  She is eating better.  No nausea or vomiting.  Bowels stable.  She is accompanied by her friend.  History obtained from both of them.     Past Medical History:  Diagnosis Date  . Barrett's esophagus with esophagitis   . Chronic bronchitis ([place])   . GERD (gastroesophageal reflux disease)   . Glaucoma   . Hypercholesteremia   . Osteoarthritis   . Osteoporosis   . Sleep apnea    has CPAP  . TIA (transient ischemic attack)    Past Surgical History:  Procedure Laterality Date  . BUNIONECTOMY     left  . ROTATOR CUFF REPAIR     right   Family History  Problem Relation Age of Onset  . Breast cancer Sister   . Heart disease Mother    Social History   Social History  . Marital status: Single    Spouse name: N/A  . Number of children: N/A  . Years of education: N/A   Social History Main Topics  . Smoking status: Never Smoker  . Smokeless tobacco: Never Used  . Alcohol use 0.0 oz/week     Comment: wine/brandy occasional  . Drug use: No  . Sexual activity: No   Other Topics Concern  . None   Social History Narrative  . None    Outpatient Encounter Prescriptions as of 07/22/2016  Medication Sig  . Aloe LIQD Take by mouth daily.   Marland Kitchen aspirin 81 MG tablet Take 81 mg by mouth daily.  . Bilberry, Vaccinium myrtillus, (BILBERRY PO) Take 1 capsule by mouth daily.  . calcium carbonate (OS-CAL) 600 MG TABS Take 600 mg by mouth daily.  . dorzolamide-timolol (COSOPT) 22.3-6.8 MG/ML ophthalmic solution  Apply to eye. Reported on 02/11/2016  . erythromycin ophthalmic ointment Reported on 02/11/2016  . Flaxseed, Linseed, (GROUND FLAX SEEDS PO) Take by mouth daily.   Marland Kitchen latanoprost (XALATAN) 0.005 % ophthalmic solution Reported on 02/11/2016  . Multiple Vitamins-Minerals (PRESERVISION AREDS 2 PO) Take by mouth 2 (two) times daily.  Marland Kitchen omeprazole (PRILOSEC) 20 MG capsule   . prednisoLONE acetate (PRED FORTE) 1 % ophthalmic suspension Place 1 drop into the left eye 2 (two) times daily.  . timolol (BETIMOL) 0.5 % ophthalmic solution Place 1 drop into the right eye daily.  . [DISCONTINUED] azithromycin (ZITHROMAX) 250 MG tablet Take 2 tablets ( total 500 mg) PO on day 1, then take 1 tablet ( total 250 mg) by mouth q24h x 4 days.  . [DISCONTINUED] benzonatate (TESSALON) 100 MG capsule Take 1 capsule (100 mg total) by mouth 2 (two) times daily as needed for cough.  . [DISCONTINUED] meclizine (ANTIVERT) 25 MG tablet Take 0.5 tablets (12.5 mg total) by mouth 3 (three) times daily as needed.  Marland Kitchen amoxicillin (AMOXIL) 500 MG capsule Take 1 capsule (500 mg total) by mouth 3 (three) times daily.  . fluticasone (FLONASE) 50 MCG/ACT nasal spray Place 2 sprays into both nostrils daily.  . [  DISCONTINUED] albuterol (PROVENTIL) (2.5 MG/3ML) 0.083% nebulizer solution 2.5 mg    No facility-administered encounter medications on file as of 07/22/2016.     Review of Systems  Constitutional: Negative for unexpected weight change.       Eating better.   HENT: Positive for congestion, postnasal drip and sinus pressure.   Respiratory: Negative for chest tightness and shortness of breath.        Cough is better.   Cardiovascular: Negative for chest pain and palpitations.  Gastrointestinal: Negative for abdominal pain, diarrhea, nausea and vomiting.  Musculoskeletal: Negative for joint swelling and myalgias.  Skin: Negative for color change and rash.  Neurological: Negative for dizziness and light-headedness.    Psychiatric/Behavioral: Negative for agitation and dysphoric mood.       Objective:    Physical Exam  Constitutional: She appears well-developed and well-nourished. No distress.  HENT:  Mouth/Throat: Oropharynx is clear and moist.  Minimal tenderness to palpation over the sinuses.  Nares with erythematous turbinates.    Neck: Neck supple.  Cardiovascular: Normal rate and regular rhythm.   Pulmonary/Chest: Breath sounds normal. No respiratory distress. She has no wheezes.  Abdominal: Soft. Bowel sounds are normal. There is no tenderness.  Musculoskeletal: She exhibits no edema or tenderness.  Lymphadenopathy:    She has no cervical adenopathy.  Skin: No rash noted. No erythema.  Psychiatric: She has a normal mood and affect. Her behavior is normal.    BP 132/78   Pulse 81   Temp 97.8 F (36.6 C) (Oral)   Ht 4\' 11"  (1.499 m)   Wt 102 lb (46.3 kg)   SpO2 93%   BMI 20.60 kg/m  Wt Readings from Last 3 Encounters:  07/22/16 102 lb (46.3 kg)  07/15/16 98 lb 9.6 oz (44.7 kg)  07/12/16 100 lb (45.4 kg)     Lab Results  Component Value Date   WBC 6.8 04/15/2016   HGB 13.8 04/15/2016   HCT 41.3 04/15/2016   PLT 264 04/15/2016   GLUCOSE 114 (H) 04/15/2016   CHOL 220 (H) 06/16/2015   TRIG 64.0 06/16/2015   HDL 82.20 06/16/2015   LDLCALC 125 (H) 06/16/2015   ALT 13 11/12/2015   AST 19 11/12/2015   NA 139 04/15/2016   K 3.4 (L) 04/15/2016   CL 104 04/15/2016   CREATININE 0.79 04/15/2016   BUN 20 04/15/2016   CO2 28 04/15/2016   TSH 1.13 06/16/2015       Assessment & Plan:   Problem List Items Addressed This Visit    GERD (gastroesophageal reflux disease)    Continue omeprazole.        Pneumonia    Took azithromycin.  Cough is better.  Will need f/u cxr.  Will f/u with cxr at next visit.        Relevant Medications   fluticasone (FLONASE) 50 MCG/ACT nasal spray   amoxicillin (AMOXIL) 500 MG capsule    Other Visit Diagnoses    Sinusitis, unspecified  chronicity, unspecified location    -  Primary   increased pressure and congestion.  treat with saline/nasacort nasal sprays.  robitussin.  amoxicillin as directed.  follow closely.  directions written.    Relevant Medications   fluticasone (FLONASE) 50 MCG/ACT nasal spray   amoxicillin (AMOXIL) 500 MG capsule     I spent 25 minutes with the patient and more than 50% of the time was spent in consultation regarding the above.  Time spent discussing her current symptoms and plans for  treatment and f/u.  Directions written for pt and went over extensively.     Einar Pheasant, MD

## 2016-07-26 ENCOUNTER — Encounter: Payer: Medicare Other | Admitting: Physical Therapy

## 2016-07-26 ENCOUNTER — Ambulatory Visit: Payer: Medicare Other | Admitting: Physical Therapy

## 2016-07-27 ENCOUNTER — Encounter: Payer: Medicare Other | Admitting: Physical Therapy

## 2016-07-28 ENCOUNTER — Ambulatory Visit: Payer: Medicare Other | Admitting: Physical Therapy

## 2016-07-29 DIAGNOSIS — S0502XD Injury of conjunctiva and corneal abrasion without foreign body, left eye, subsequent encounter: Secondary | ICD-10-CM | POA: Diagnosis not present

## 2016-07-29 DIAGNOSIS — H04123 Dry eye syndrome of bilateral lacrimal glands: Secondary | ICD-10-CM | POA: Diagnosis not present

## 2016-07-29 DIAGNOSIS — H401123 Primary open-angle glaucoma, left eye, severe stage: Secondary | ICD-10-CM | POA: Diagnosis not present

## 2016-07-29 DIAGNOSIS — H35363 Drusen (degenerative) of macula, bilateral: Secondary | ICD-10-CM | POA: Diagnosis not present

## 2016-07-29 DIAGNOSIS — H401113 Primary open-angle glaucoma, right eye, severe stage: Secondary | ICD-10-CM | POA: Diagnosis not present

## 2016-07-29 DIAGNOSIS — H47233 Glaucomatous optic atrophy, bilateral: Secondary | ICD-10-CM | POA: Diagnosis not present

## 2016-07-29 DIAGNOSIS — H524 Presbyopia: Secondary | ICD-10-CM | POA: Diagnosis not present

## 2016-07-29 DIAGNOSIS — H52221 Regular astigmatism, right eye: Secondary | ICD-10-CM | POA: Diagnosis not present

## 2016-07-29 DIAGNOSIS — H353132 Nonexudative age-related macular degeneration, bilateral, intermediate dry stage: Secondary | ICD-10-CM | POA: Diagnosis not present

## 2016-07-29 DIAGNOSIS — H5211 Myopia, right eye: Secondary | ICD-10-CM | POA: Diagnosis not present

## 2016-08-02 ENCOUNTER — Encounter: Payer: Self-pay | Admitting: Internal Medicine

## 2016-08-02 NOTE — Assessment & Plan Note (Signed)
Took azithromycin.  Cough is better.  Will need f/u cxr.  Will f/u with cxr at next visit.

## 2016-08-02 NOTE — Assessment & Plan Note (Signed)
Continue omeprazole 

## 2016-08-11 DIAGNOSIS — M79675 Pain in left toe(s): Secondary | ICD-10-CM | POA: Diagnosis not present

## 2016-08-11 DIAGNOSIS — B351 Tinea unguium: Secondary | ICD-10-CM | POA: Diagnosis not present

## 2016-08-11 DIAGNOSIS — M79674 Pain in right toe(s): Secondary | ICD-10-CM | POA: Diagnosis not present

## 2016-08-15 ENCOUNTER — Emergency Department: Payer: Medicare Other

## 2016-08-15 ENCOUNTER — Encounter: Payer: Self-pay | Admitting: Emergency Medicine

## 2016-08-15 ENCOUNTER — Emergency Department
Admission: EM | Admit: 2016-08-15 | Discharge: 2016-08-15 | Disposition: A | Discharge status: Home / Self Care | Payer: Medicare Other | Attending: Emergency Medicine | Admitting: Emergency Medicine

## 2016-08-15 DIAGNOSIS — S8992XA Unspecified injury of left lower leg, initial encounter: Secondary | ICD-10-CM | POA: Diagnosis present

## 2016-08-15 DIAGNOSIS — X58XXXA Exposure to other specified factors, initial encounter: Secondary | ICD-10-CM | POA: Diagnosis not present

## 2016-08-15 DIAGNOSIS — S82092A Other fracture of left patella, initial encounter for closed fracture: Secondary | ICD-10-CM | POA: Insufficient documentation

## 2016-08-15 DIAGNOSIS — Y999 Unspecified external cause status: Secondary | ICD-10-CM | POA: Diagnosis not present

## 2016-08-15 DIAGNOSIS — Y929 Unspecified place or not applicable: Secondary | ICD-10-CM | POA: Diagnosis not present

## 2016-08-15 DIAGNOSIS — Z7982 Long term (current) use of aspirin: Secondary | ICD-10-CM | POA: Diagnosis not present

## 2016-08-15 DIAGNOSIS — S72402A Unspecified fracture of lower end of left femur, initial encounter for closed fracture: Secondary | ICD-10-CM | POA: Diagnosis not present

## 2016-08-15 DIAGNOSIS — M25562 Pain in left knee: Secondary | ICD-10-CM | POA: Diagnosis not present

## 2016-08-15 DIAGNOSIS — Z79899 Other long term (current) drug therapy: Secondary | ICD-10-CM | POA: Insufficient documentation

## 2016-08-15 DIAGNOSIS — Y939 Activity, unspecified: Secondary | ICD-10-CM | POA: Diagnosis not present

## 2016-08-15 DIAGNOSIS — T148XXA Other injury of unspecified body region, initial encounter: Secondary | ICD-10-CM

## 2016-08-15 NOTE — ED Provider Notes (Signed)
[facility] Emergency Department Provider Note  ____________________________________________   First MD Initiated Contact with Patient 08/15/16 [phone]     (approximate)  I have reviewed the triage vital signs and the nursing notes.   HISTORY  Chief Complaint Knee Pain    HPI Christy Cisneros is a 83 y.o. female patient complain of left lateral knee pain upon a.m. awakening. Patient states she does daily walks with a companion did not notice any discomfort yesterday she finished walking. Patient states she awakened this morning with knee pain which increases with weightbearing. No palliative measures taken for this complaint.patient is rating the pain as 8/10. Patient rates pain as 8/10. Patient described a pain as "achy". Patient has a history ARTHRITIS and osteoporosis.   Past Medical History:  Diagnosis Date  . Barrett's esophagus with esophagitis   . Chronic bronchitis ([place])   . GERD (gastroesophageal reflux disease)   . Glaucoma   . Hypercholesteremia   . Osteoarthritis   . Osteoporosis   . Sleep apnea    has CPAP  . TIA (transient ischemic attack)     Patient Active Problem List   Diagnosis Date Noted  . Pneumonia 07/17/2016  . Mild cognitive impairment 05/15/2016  . Dizziness 04/21/2016  . Neck pain 02/11/2016  . Muscle cramps 11/14/2015  . Urine incontinence 08/16/2015  . Hematoma 08/16/2015  . Fatigue 07/12/2015  . Headache 06/16/2015  . Unsteady gait 06/01/2015  . GERD (gastroesophageal reflux disease) 08/11/2014  . UTI (urinary tract infection) 08/07/2014  . DYSPNEA 04/17/2009  . HYPERCHOLESTEROLEMIA 04/16/2009  . Obstructive sleep apnea 04/16/2009  . Glaucoma 04/16/2009  . Transient cerebral ischemia 04/16/2009  . BARRETTS ESOPHAGUS 04/16/2009  . OSTEOARTHRITIS 04/16/2009  . Osteoporosis 04/16/2009    Past Surgical History:  Procedure Laterality Date  . BUNIONECTOMY     left  . ROTATOR CUFF REPAIR     right     Prior to Admission medications   Medication Sig Start Date End Date Taking? Authorizing Provider  Aloe LIQD Take by mouth daily.     Historical Provider, MD  amoxicillin (AMOXIL) 500 MG capsule Take 1 capsule (500 mg total) by mouth 3 (three) times daily. 07/22/16   Einar Pheasant, MD  aspirin 81 MG tablet Take 81 mg by mouth daily.    Historical Provider, MD  Bilberry, Vaccinium myrtillus, (BILBERRY PO) Take 1 capsule by mouth daily.    Historical Provider, MD  calcium carbonate (OS-CAL) 600 MG TABS Take 600 mg by mouth daily.    Historical Provider, MD  dorzolamide-timolol (COSOPT) 22.3-6.8 MG/ML ophthalmic solution Apply to eye. Reported on 02/11/2016 12/10/14   Historical Provider, MD  erythromycin ophthalmic ointment Reported on 02/11/2016 12/08/14   Historical Provider, MD  Flaxseed, Linseed, (GROUND FLAX SEEDS PO) Take by mouth daily.     Historical Provider, MD  fluticasone (FLONASE) 50 MCG/ACT nasal spray Place 2 sprays into both nostrils daily. 07/22/16   Einar Pheasant, MD  latanoprost (XALATAN) 0.005 % ophthalmic solution Reported on 02/11/2016 12/10/14   Historical Provider, MD  Multiple Vitamins-Minerals (PRESERVISION AREDS 2 PO) Take by mouth 2 (two) times daily.    Historical Provider, MD  omeprazole (PRILOSEC) 20 MG capsule  05/12/15   Historical Provider, MD  prednisoLONE acetate (PRED FORTE) 1 % ophthalmic suspension Place 1 drop into the left eye 2 (two) times daily.    Historical Provider, MD  timolol (BETIMOL) 0.5 % ophthalmic solution Place 1 drop into the right eye daily.  Historical Provider, MD    Allergies Alphagan [brimonidine]; Avelox [moxifloxacin hcl in nacl]; Bacitracin; Cefdinir; Cephalexin; Clarithromycin; Codeine; Erythromycin; Hydrocodone-acetaminophen; Lansoprazole; Polymyxin b; and Vigamox [moxifloxacin]  Family History  Problem Relation Age of Onset  . Breast cancer Sister   . Heart disease Mother     Social History Social History  Substance Use  Topics  . Smoking status: Never Smoker  . Smokeless tobacco: Never Used  . Alcohol use 0.0 oz/week     Comment: wine/brandy occasional    Review of Systems Constitutional: No fever/chills Eyes: No visual changes. ENT: No sore throat. Cardiovascular: Denies chest pain. Respiratory: Denies shortness of breath. Gastrointestinal: No abdominal pain.  No nausea, no vomiting.  No diarrhea.  No constipation. Genitourinary: Negative for dysuria. Musculoskeletal:Left knee pain Skin: Negative for rash. Neurological: Negative for headaches, focal weakness or numbness. Allergic/Immunilogical: See medication list.  ____________________________________________   PHYSICAL EXAM:  VITAL SIGNS: ED Triage Vitals [08/15/16 0902]  Enc Vitals Group     BP 136/75     Pulse Rate 73     Resp 18     Temp 97.6 F (36.4 C)     Temp Source Oral     SpO2 97 %     Weight 102 lb (46.3 kg)     Height 5\' 2"  (1.575 m)     Head Circumference      Peak Flow      Pain Score      Pain Loc      Pain Edu?      Excl. in [place]?     Constitutional: Alert and oriented. Well appearing and in no acute distress. Eyes: Conjunctivae are normal. PERRL. EOMI. Head: Atraumatic. Nose: No congestion/rhinnorhea. Mouth/Throat: Mucous membranes are moist.  Oropharynx non-erythematous. Neck: No stridor. No cervical spine tenderness to palpation. Hematological/Lymphatic/Immunilogical: No cervical lymphadenopathy. Cardiovascular: Normal rate, regular rhythm. Grossly normal heart sounds.  Good peripheral circulation. Respiratory: Normal respiratory effort.  No retractions. Lungs CTAB. Gastrointestinal: Soft and nontender. No distention. No abdominal bruits. No CVA tenderness. Musculoskeletal: No lower extremity tenderness nor edema.  No joint effusions. Neurologic:  Normal speech and language. No gross focal neurologic deficits are appreciated. No gait instability. Skin:  Skin is warm, dry and intact. No rash  noted. Psychiatric: Mood and affect are normal. Speech and behavior are normal.  ____________________________________________   LABS (all labs ordered are listed, but only abnormal results are displayed)  Labs Reviewed - No data to display ____________________________________________  EKG   ____________________________________________  RADIOLOGY  X-ray showed a small avulsion off the medial condyle. ____________________________________________   PROCEDURES  Procedure(s) performed: None  Procedures  Critical Care performed: No  ____________________________________________   INITIAL IMPRESSION / ASSESSMENT AND PLAN / ED COURSE  Pertinent labs & imaging results that were available during my care of the patient were reviewed by me and considered in my medical decision making (see chart for details).  Avulsion fracture left knee. Patient given discharge care instructions. Patient advised limited weightbearing to follow-up by orthopedics. Advised take Tylenol for pain.  Clinical Course      ____________________________________________   FINAL CLINICAL IMPRESSION(S) / ED DIAGNOSES  Final diagnoses:  Avulsion fracture      NEW MEDICATIONS STARTED DURING THIS VISIT:  New Prescriptions   No medications on file     Note:  This document was prepared using Dragon voice recognition software and may include unintentional dictation errors.    Sable Feil, PA-C 08/15/16 1034    Jeneen Rinks  Leone Haven, MD 08/15/16 1443

## 2016-08-15 NOTE — Discharge Instructions (Signed)
Advised to decrease prolonged walking until evaluation by orthopedics. May take Tylenol for pain.

## 2016-08-15 NOTE — ED Triage Notes (Signed)
States she woke up with pain to left knee denies any injury  No swelling noted

## 2016-08-24 DIAGNOSIS — M25552 Pain in left hip: Secondary | ICD-10-CM | POA: Diagnosis not present

## 2016-08-24 DIAGNOSIS — M1612 Unilateral primary osteoarthritis, left hip: Secondary | ICD-10-CM | POA: Diagnosis not present

## 2016-08-30 ENCOUNTER — Encounter: Payer: Self-pay | Admitting: Internal Medicine

## 2016-08-30 ENCOUNTER — Ambulatory Visit (INDEPENDENT_AMBULATORY_CARE_PROVIDER_SITE_OTHER): Payer: Medicare Other | Admitting: Internal Medicine

## 2016-08-30 VITALS — BP 140/82 | HR 83 | Temp 98.6°F | Resp 18 | Ht 62.0 in | Wt 105.4 lb

## 2016-08-30 DIAGNOSIS — J189 Pneumonia, unspecified organism: Secondary | ICD-10-CM

## 2016-08-30 DIAGNOSIS — K2100 Gastro-esophageal reflux disease with esophagitis, without bleeding: Secondary | ICD-10-CM

## 2016-08-30 DIAGNOSIS — E7800 Pure hypercholesterolemia, unspecified: Secondary | ICD-10-CM

## 2016-08-30 DIAGNOSIS — M810 Age-related osteoporosis without current pathological fracture: Secondary | ICD-10-CM | POA: Diagnosis not present

## 2016-08-30 DIAGNOSIS — J181 Lobar pneumonia, unspecified organism: Secondary | ICD-10-CM

## 2016-08-30 DIAGNOSIS — R2681 Unsteadiness on feet: Secondary | ICD-10-CM

## 2016-08-30 DIAGNOSIS — R413 Other amnesia: Secondary | ICD-10-CM | POA: Diagnosis not present

## 2016-08-30 DIAGNOSIS — K2270 Barrett's esophagus without dysplasia: Secondary | ICD-10-CM

## 2016-08-30 LAB — CBC WITH DIFFERENTIAL/PLATELET
Basophils Absolute: 0.1 10*3/uL (ref 0.0–0.1)
Basophils Relative: 1 % (ref 0.0–3.0)
EOS PCT: 0.9 % (ref 0.0–5.0)
Eosinophils Absolute: 0.1 10*3/uL (ref 0.0–0.7)
HCT: 41 % (ref 36.0–46.0)
HEMOGLOBIN: 13.7 g/dL (ref 12.0–15.0)
LYMPHS PCT: 12.8 % (ref 12.0–46.0)
Lymphs Abs: 1.1 10*3/uL (ref 0.7–4.0)
MCHC: 33.5 g/dL (ref 30.0–36.0)
MCV: 92.2 fl (ref 78.0–100.0)
MONO ABS: 0.8 10*3/uL (ref 0.1–1.0)
MONOS PCT: 9.5 % (ref 3.0–12.0)
Neutro Abs: 6.8 10*3/uL (ref 1.4–7.7)
Neutrophils Relative %: 75.8 % (ref 43.0–77.0)
Platelets: 276 10*3/uL (ref 150.0–400.0)
RBC: 4.44 Mil/uL (ref 3.87–5.11)
RDW: 14.5 % (ref 11.5–15.5)
WBC: 8.9 10*3/uL (ref 4.0–10.5)

## 2016-08-30 LAB — VITAMIN D 25 HYDROXY (VIT D DEFICIENCY, FRACTURES): VITD: 32.11 ng/mL (ref 30.00–100.00)

## 2016-08-30 LAB — COMPREHENSIVE METABOLIC PANEL
ALBUMIN: 4.3 g/dL (ref 3.5–5.2)
ALK PHOS: 53 U/L (ref 39–117)
ALT: 14 U/L (ref 0–35)
AST: 19 U/L (ref 0–37)
BUN: 17 mg/dL (ref 6–23)
CO2: 31 mEq/L (ref 19–32)
Calcium: 9.4 mg/dL (ref 8.4–10.5)
Chloride: 107 mEq/L (ref 96–112)
Creatinine, Ser: 0.77 mg/dL (ref 0.40–1.20)
GFR: 74.14 mL/min (ref >60.00)
Glucose, Bld: 105 mg/dL — ABNORMAL HIGH (ref 70–99)
POTASSIUM: 4.1 meq/L (ref 3.5–5.1)
SODIUM: 141 meq/L (ref 135–145)
TOTAL PROTEIN: 6.7 g/dL (ref 6.0–8.3)
Total Bilirubin: 0.4 mg/dL (ref 0.2–1.2)

## 2016-08-30 LAB — TSH: TSH: 1.7 u[IU]/mL (ref 0.35–4.50)

## 2016-08-30 LAB — VITAMIN B12: Vitamin B-12: 437 pg/mL (ref 211–911)

## 2016-08-30 NOTE — Progress Notes (Signed)
Pre-visit discussion using our clinic review tool. No additional management support is needed unless otherwise documented below in the visit note.  

## 2016-08-30 NOTE — Progress Notes (Signed)
Patient ID: Christy Cisneros, female   DOB: 10/31/1919, 83 y.o.   MRN: [id number]   Subjective:    Patient ID: Christy Cisneros, female    DOB: 02/01/1920, 83 y.o.   MRN: [id number]  HPI  Patient here for a scheduled follow up.  She is doing much better.  Feels better.  Walking.  Eating better.  Weight has increased.  No increased acid reflux.  No abdominal pain.  Bowels moving.  Was seen recently for left knee pain.  States pain has resolved now.  She is concerned regarding her memory.  Request referral to neurology for evaluation.     Past Medical History:  Diagnosis Date  . Barrett's esophagus with esophagitis   . Chronic bronchitis ([place])   . GERD (gastroesophageal reflux disease)   . Glaucoma   . Hypercholesteremia   . Osteoarthritis   . Osteoporosis   . Sleep apnea    has CPAP  . TIA (transient ischemic attack)    Past Surgical History:  Procedure Laterality Date  . BUNIONECTOMY     left  . ROTATOR CUFF REPAIR     right   Family History  Problem Relation Age of Onset  . Breast cancer Sister   . Heart disease Mother    Social History   Social History  . Marital status: Single    Spouse name: N/A  . Number of children: N/A  . Years of education: N/A   Social History Main Topics  . Smoking status: Never Smoker  . Smokeless tobacco: Never Used  . Alcohol use 0.0 oz/week     Comment: wine/brandy occasional  . Drug use: No  . Sexual activity: No   Other Topics Concern  . None   Social History Narrative  . None    Outpatient Encounter Prescriptions as of 08/30/2016  Medication Sig  . Aloe LIQD Take by mouth daily.   Marland Kitchen amoxicillin (AMOXIL) 500 MG capsule Take 1 capsule (500 mg total) by mouth 3 (three) times daily.  Marland Kitchen aspirin 81 MG tablet Take 81 mg by mouth daily.  . Bilberry, Vaccinium myrtillus, (BILBERRY PO) Take 1 capsule by mouth daily.  . calcium carbonate (OS-CAL) 600 MG TABS Take 600 mg by mouth daily.  . dorzolamide-timolol (COSOPT) 22.3-6.8  MG/ML ophthalmic solution Apply to eye. Reported on 02/11/2016  . erythromycin ophthalmic ointment Reported on 02/11/2016  . Flaxseed, Linseed, (GROUND FLAX SEEDS PO) Take by mouth daily.   . fluticasone (FLONASE) 50 MCG/ACT nasal spray Place 2 sprays into both nostrils daily.  Marland Kitchen latanoprost (XALATAN) 0.005 % ophthalmic solution Reported on 02/11/2016  . Multiple Vitamins-Minerals (PRESERVISION AREDS 2 PO) Take by mouth 2 (two) times daily.  Marland Kitchen omeprazole (PRILOSEC) 20 MG capsule   . prednisoLONE acetate (PRED FORTE) 1 % ophthalmic suspension Place 1 drop into the left eye 2 (two) times daily.  . timolol (BETIMOL) 0.5 % ophthalmic solution Place 1 drop into the right eye daily.   No facility-administered encounter medications on file as of 08/30/2016.     Review of Systems  Constitutional: Negative for unexpected weight change.       Increased appetite.  Weight has improved.    HENT: Negative for congestion and sinus pressure.   Respiratory: Negative for cough, chest tightness and shortness of breath.   Cardiovascular: Negative for chest pain, palpitations and leg swelling.  Gastrointestinal: Negative for abdominal pain, diarrhea, nausea and vomiting.  Genitourinary: Negative for difficulty urinating and dysuria.  Musculoskeletal: Negative for joint  swelling and myalgias.  Skin: Negative for color change and rash.  Neurological: Negative for dizziness, light-headedness and headaches.  Psychiatric/Behavioral: Negative for agitation and dysphoric mood.       Objective:    Physical Exam  Constitutional: She appears well-developed and well-nourished. No distress.  HENT:  Nose: Nose normal.  Mouth/Throat: Oropharynx is clear and moist.  Neck: Neck supple. No thyromegaly present.  Cardiovascular: Normal rate and regular rhythm.   Pulmonary/Chest: Breath sounds normal. No respiratory distress. She has no wheezes.  Abdominal: Soft. Bowel sounds are normal. There is no tenderness.    Musculoskeletal: She exhibits no edema or tenderness.  Lymphadenopathy:    She has no cervical adenopathy.  Skin: No rash noted. No erythema.  Psychiatric: She has a normal mood and affect. Her behavior is normal.    BP 140/82 (BP Location: Left Arm, Patient Position: Sitting, Cuff Size: Large)   Pulse 83   Temp 98.6 F (37 C) (Oral)   Resp 18   Ht 5\' 2"  (1.575 m)   Wt 105 lb 6.4 oz (47.8 kg)   SpO2 97%   BMI 19.28 kg/m  Wt Readings from Last 3 Encounters:  08/30/16 105 lb 6.4 oz (47.8 kg)  08/15/16 102 lb (46.3 kg)  07/22/16 102 lb (46.3 kg)     Lab Results  Component Value Date   WBC 8.9 08/30/2016   HGB 13.7 08/30/2016   HCT 41.0 08/30/2016   PLT 276.0 08/30/2016   GLUCOSE 105 (H) 08/30/2016   CHOL 220 (H) 06/16/2015   TRIG 64.0 06/16/2015   HDL 82.20 06/16/2015   LDLCALC 125 (H) 06/16/2015   ALT 14 08/30/2016   AST 19 08/30/2016   NA 141 08/30/2016   K 4.1 08/30/2016   CL 107 08/30/2016   CREATININE 0.77 08/30/2016   BUN 17 08/30/2016   CO2 31 08/30/2016   TSH 1.70 08/30/2016    Dg Knee Complete 4 Views Left  Result Date: 08/15/2016 CLINICAL DATA:  Woke up with left knee pain. EXAM: LEFT KNEE - COMPLETE 4+ VIEW COMPARISON:  None. FINDINGS: There is chondrocalcinosis, most prominent in the medial knee compartment. Mild medial knee joint space narrowing. There appears to be an avulsion injury along the femoral medial epicondyle. Cannot exclude soft tissue swelling in this area. However, the margins of this small bone fragment are well-corticated and suspect a chronic injury. There is no significant suprapatellar joint effusion. Knee is located without acute fracture. IMPRESSION: Evidence for an avulsion injury involving the medial femoral epicondyle. Suspect this is an old injury but recommend clinical correlation in this area. Mild osteoarthritis in the left knee with chondrocalcinosis. Electronically Signed   By: Markus Daft M.D.   On: 08/15/2016 10:08        Assessment & Plan:   Problem List Items Addressed This Visit    BARRETTS ESOPHAGUS    On omeprazole.  Declines further evaluation.  Controlled.        GERD (gastroesophageal reflux disease)    On omeprazole.  Has known Barretts.  Has declined f/u EGD.        HYPERCHOLESTEROLEMIA    Follow lipid panel.        Memory change - Primary    Is concerned regarding a change in her memory.  She lives by herself.  Check tsh, cbc and B12.  She request referral to neurology.        Relevant Orders   CBC with Differential/Platelet (Completed)   Comprehensive metabolic panel (  Completed)   TSH (Completed)   Vitamin B12 (Completed)   Ambulatory referral to Neurology   Osteoporosis    Previously received reclast.  Continue weight bearing exercise.  Follow.       Relevant Orders   VITAMIN D 25 Hydroxy (Vit-D Deficiency, Fractures) (Completed)   Pneumonia    Needs f/u cxr.  Symptoms have cleared.        Unsteady gait    Has been to therapy.  Continue exercise.            Einar Pheasant, MD

## 2016-08-31 ENCOUNTER — Encounter: Payer: Self-pay | Admitting: *Deleted

## 2016-09-04 ENCOUNTER — Encounter: Payer: Self-pay | Admitting: Internal Medicine

## 2016-09-04 DIAGNOSIS — R413 Other amnesia: Secondary | ICD-10-CM | POA: Insufficient documentation

## 2016-09-04 NOTE — Assessment & Plan Note (Signed)
Is concerned regarding a change in her memory.  She lives by herself.  Check tsh, cbc and B12.  She request referral to neurology.

## 2016-09-04 NOTE — Assessment & Plan Note (Signed)
Follow lipid panel.   

## 2016-09-04 NOTE — Assessment & Plan Note (Signed)
On omeprazole.  Has known Barretts.  Has declined f/u EGD.

## 2016-09-04 NOTE — Assessment & Plan Note (Signed)
On omeprazole.  Declines further evaluation.  Controlled.

## 2016-09-04 NOTE — Assessment & Plan Note (Signed)
Previously received reclast.  Continue weight bearing exercise.  Follow.

## 2016-09-04 NOTE — Assessment & Plan Note (Signed)
Needs f/u cxr.  Symptoms have cleared.

## 2016-09-04 NOTE — Assessment & Plan Note (Signed)
Has been to therapy.  Continue exercise.

## 2016-09-07 ENCOUNTER — Encounter: Payer: Self-pay | Admitting: Emergency Medicine

## 2016-09-07 ENCOUNTER — Emergency Department: Payer: Medicare Other

## 2016-09-07 ENCOUNTER — Emergency Department
Admission: EM | Admit: 2016-09-07 | Discharge: 2016-09-07 | Disposition: A | Discharge status: Home / Self Care | Payer: Medicare Other | Attending: Emergency Medicine | Admitting: Emergency Medicine

## 2016-09-07 ENCOUNTER — Telehealth: Payer: Self-pay

## 2016-09-07 DIAGNOSIS — Y999 Unspecified external cause status: Secondary | ICD-10-CM | POA: Diagnosis not present

## 2016-09-07 DIAGNOSIS — H47233 Glaucomatous optic atrophy, bilateral: Secondary | ICD-10-CM | POA: Diagnosis not present

## 2016-09-07 DIAGNOSIS — H5211 Myopia, right eye: Secondary | ICD-10-CM | POA: Diagnosis not present

## 2016-09-07 DIAGNOSIS — Z7982 Long term (current) use of aspirin: Secondary | ICD-10-CM | POA: Diagnosis not present

## 2016-09-07 DIAGNOSIS — S0990XA Unspecified injury of head, initial encounter: Secondary | ICD-10-CM | POA: Diagnosis present

## 2016-09-07 DIAGNOSIS — H401113 Primary open-angle glaucoma, right eye, severe stage: Secondary | ICD-10-CM | POA: Diagnosis not present

## 2016-09-07 DIAGNOSIS — H353132 Nonexudative age-related macular degeneration, bilateral, intermediate dry stage: Secondary | ICD-10-CM | POA: Diagnosis not present

## 2016-09-07 DIAGNOSIS — H35363 Drusen (degenerative) of macula, bilateral: Secondary | ICD-10-CM | POA: Diagnosis not present

## 2016-09-07 DIAGNOSIS — H52221 Regular astigmatism, right eye: Secondary | ICD-10-CM | POA: Diagnosis not present

## 2016-09-07 DIAGNOSIS — Y929 Unspecified place or not applicable: Secondary | ICD-10-CM | POA: Diagnosis not present

## 2016-09-07 DIAGNOSIS — Y939 Activity, unspecified: Secondary | ICD-10-CM | POA: Diagnosis not present

## 2016-09-07 DIAGNOSIS — H1820 Unspecified corneal edema: Secondary | ICD-10-CM | POA: Diagnosis not present

## 2016-09-07 DIAGNOSIS — H401123 Primary open-angle glaucoma, left eye, severe stage: Secondary | ICD-10-CM | POA: Diagnosis not present

## 2016-09-07 DIAGNOSIS — H04123 Dry eye syndrome of bilateral lacrimal glands: Secondary | ICD-10-CM | POA: Diagnosis not present

## 2016-09-07 DIAGNOSIS — W1800XA Striking against unspecified object with subsequent fall, initial encounter: Secondary | ICD-10-CM | POA: Insufficient documentation

## 2016-09-07 DIAGNOSIS — S0003XA Contusion of scalp, initial encounter: Secondary | ICD-10-CM | POA: Diagnosis not present

## 2016-09-07 DIAGNOSIS — H524 Presbyopia: Secondary | ICD-10-CM | POA: Diagnosis not present

## 2016-09-07 LAB — BASIC METABOLIC PANEL
ANION GAP: 5 (ref 5–15)
BUN: 21 mg/dL — AB (ref 6–20)
CO2: 32 mmol/L (ref 22–32)
Calcium: 9.3 mg/dL (ref 8.9–10.3)
Chloride: 102 mmol/L (ref 101–111)
Creatinine, Ser: 0.73 mg/dL (ref 0.44–1.00)
GFR calc Af Amer: >60 mL/min (ref >60)
Glucose, Bld: 116 mg/dL — ABNORMAL HIGH (ref 65–99)
POTASSIUM: 4.1 mmol/L (ref 3.5–5.1)
SODIUM: 139 mmol/L (ref 135–145)

## 2016-09-07 LAB — URINALYSIS, COMPLETE (UACMP) WITH MICROSCOPIC
BACTERIA UA: NONE SEEN
BILIRUBIN URINE: NEGATIVE
GLUCOSE, UA: NEGATIVE mg/dL
HGB URINE DIPSTICK: NEGATIVE
KETONES UR: NEGATIVE mg/dL
LEUKOCYTES UA: NEGATIVE
NITRITE: NEGATIVE
Protein, ur: 30 mg/dL — AB
Specific Gravity, Urine: 1.019 (ref 1.005–1.030)
pH: 6 (ref 5.0–8.0)

## 2016-09-07 LAB — CBC
HEMATOCRIT: 40.6 % (ref 35.0–47.0)
Hemoglobin: 13.9 g/dL (ref 12.0–16.0)
MCH: 31 pg (ref 26.0–34.0)
MCHC: 34.2 g/dL (ref 32.0–36.0)
MCV: 90.7 fL (ref 80.0–100.0)
Platelets: 279 10*3/uL (ref 150–440)
RBC: 4.48 MIL/uL (ref 3.80–5.20)
RDW: 14.4 % (ref 11.5–14.5)
WBC: 7.6 10*3/uL (ref 3.6–11.0)

## 2016-09-07 NOTE — ED Provider Notes (Signed)
[facility] Emergency Department Provider Note ____________________________________________   None    (approximate)  I have reviewed the triage vital signs and the nursing notes.   HISTORY  Chief Complaint Fall  HPI Christy Cisneros is a 83 y.o. female who presents to the emergency department for evaluation after sustaining a non-syncopal fall while reaching overhead. She fell backward and struck her head on the floor. She denies pain, nausea, or vomiting. She has a history of imbalance disorder and is planning to attend a "balance class" when possible. She has taken no new medications recently and denies recent illness. No noticeable vision changes since the fall.   Past Medical History:  Diagnosis Date  . Barrett's esophagus with esophagitis   . Chronic bronchitis ([place])   . GERD (gastroesophageal reflux disease)   . Glaucoma   . Hypercholesteremia   . Osteoarthritis   . Osteoporosis   . Sleep apnea    has CPAP  . TIA (transient ischemic attack)     Patient Active Problem List   Diagnosis Date Noted  . Memory change 09/04/2016  . Pneumonia 07/17/2016  . Mild cognitive impairment 05/15/2016  . Dizziness 04/21/2016  . Neck pain 02/11/2016  . Muscle cramps 11/14/2015  . Urine incontinence 08/16/2015  . Hematoma 08/16/2015  . Fatigue 07/12/2015  . Headache 06/16/2015  . Unsteady gait 06/01/2015  . GERD (gastroesophageal reflux disease) 08/11/2014  . UTI (urinary tract infection) 08/07/2014  . DYSPNEA 04/17/2009  . HYPERCHOLESTEROLEMIA 04/16/2009  . Obstructive sleep apnea 04/16/2009  . Glaucoma 04/16/2009  . Transient cerebral ischemia 04/16/2009  . BARRETTS ESOPHAGUS 04/16/2009  . OSTEOARTHRITIS 04/16/2009  . Osteoporosis 04/16/2009    Past Surgical History:  Procedure Laterality Date  . BUNIONECTOMY     left  . ROTATOR CUFF REPAIR     right    Prior to Admission medications   Medication Sig Start Date End Date Taking?  Authorizing Provider  Aloe LIQD Take by mouth daily.     Historical Provider, MD  amoxicillin (AMOXIL) 500 MG capsule Take 1 capsule (500 mg total) by mouth 3 (three) times daily. 07/22/16   Einar Pheasant, MD  aspirin 81 MG tablet Take 81 mg by mouth daily.    Historical Provider, MD  Bilberry, Vaccinium myrtillus, (BILBERRY PO) Take 1 capsule by mouth daily.    Historical Provider, MD  calcium carbonate (OS-CAL) 600 MG TABS Take 600 mg by mouth daily.    Historical Provider, MD  dorzolamide-timolol (COSOPT) 22.3-6.8 MG/ML ophthalmic solution Apply to eye. Reported on 02/11/2016 12/10/14   Historical Provider, MD  erythromycin ophthalmic ointment Reported on 02/11/2016 12/08/14   Historical Provider, MD  Flaxseed, Linseed, (GROUND FLAX SEEDS PO) Take by mouth daily.     Historical Provider, MD  fluticasone (FLONASE) 50 MCG/ACT nasal spray Place 2 sprays into both nostrils daily. 07/22/16   Einar Pheasant, MD  latanoprost (XALATAN) 0.005 % ophthalmic solution Reported on 02/11/2016 12/10/14   Historical Provider, MD  Multiple Vitamins-Minerals (PRESERVISION AREDS 2 PO) Take by mouth 2 (two) times daily.    Historical Provider, MD  omeprazole (PRILOSEC) 20 MG capsule  05/12/15   Historical Provider, MD  prednisoLONE acetate (PRED FORTE) 1 % ophthalmic suspension Place 1 drop into the left eye 2 (two) times daily.    Historical Provider, MD  timolol (BETIMOL) 0.5 % ophthalmic solution Place 1 drop into the right eye daily.    Historical Provider, MD    Allergies Alphagan [brimonidine]; Avelox [moxifloxacin  hcl in nacl]; Bacitracin; Cefdinir; Cephalexin; Clarithromycin; Codeine; Erythromycin; Hydrocodone-acetaminophen; Lansoprazole; Polymyxin b; and Vigamox [moxifloxacin]  Family History  Problem Relation Age of Onset  . Breast cancer Sister   . Heart disease Mother     Social History Social History  Substance Use Topics  . Smoking status: Never Smoker  . Smokeless tobacco: Never Used  . Alcohol  use 0.0 oz/week     Comment: wine/brandy occasional    Review of Systems Constitutional: Well appearing. Eyes: No visual changes. Cardiovascular: Denies chest pain. Respiratory: Denies shortness of breath. Gastrointestinal: No abdominal pain.  No nausea, no vomiting. Genitourinary: Negative for dysuria. Musculoskeletal: Negative for back pain. Negative for neck pain. Skin: Negative for rash. Positive for hematoma. Neurological: Negative for headaches, focal weakness or numbness. ____________________________________________   PHYSICAL EXAM:  VITAL SIGNS: ED Triage Vitals [09/07/16 1436]  Enc Vitals Group     BP (!) 179/110     Pulse Rate 81     Resp 18     Temp 98 F (36.7 C)     Temp Source Oral     SpO2 98 %     Weight 102 lb (46.3 kg)     Height 5\' 2"  (1.575 m)     Head Circumference      Peak Flow      Pain Score 0     Pain Loc      Pain Edu?      Excl. in [place]?     Constitutional: Alert and oriented. Well appearing and in no acute distress. Eyes: Conjunctivae are normal. PERRL. EOMI. Chronic vision deficit in left eye. Head: Hematoma noted posterior occiput. Nose: No congestion/rhinnorhea. Mouth/Throat: Mucous membranes are moist. Neck: No stridor.  No cervical spine tenderness to palpation. Cardiovascular: Normal rate, regular rhythm. Grossly normal heart sounds.  Good peripheral circulation. Respiratory: Normal respiratory effort.  No retractions. Lungs CTAB. Gastrointestinal: Soft and nontender. No distention. No abdominal bruits. Musculoskeletal: No lower extremity tenderness nor edema. Neurologic:  Normal speech and language. No gross focal neurologic deficits are appreciated. No gait instability. Skin:  Skin is warm, dry and intact. No rash noted. Psychiatric: Mood and affect are normal. Speech and behavior are normal.  ____________________________________________   LABS (all labs ordered are listed, but only abnormal results are displayed)  Labs  Reviewed  BASIC METABOLIC PANEL - Abnormal; Notable for the following:       Result Value   Glucose, Bld 116 (*)    BUN 21 (*)    All other components within normal limits  URINALYSIS, COMPLETE (UACMP) WITH MICROSCOPIC - Abnormal; Notable for the following:    Color, Urine YELLOW (*)    APPearance CLEAR (*)    Protein, ur 30 (*)    Squamous Epithelial / LPF 0-5 (*)    All other components within normal limits  CBC   ____________________________________________  EKG   ____________________________________________  RADIOLOGY  CT head negative for acute abnormality per radiology. ____________________________________________   PROCEDURES  Procedure(s) performed: None  Procedures  Critical Care performed: No  ____________________________________________   INITIAL IMPRESSION / ASSESSMENT AND PLAN / ED COURSE  Pertinent labs & imaging results that were available during my care of the patient were reviewed by me and considered in my medical decision making (see chart for details).  83 year old female presenting to the emergency department for evaluation after falling and striking the back of her head. CT negative for findings of concern. She will be discharged with her  friend and caregiver. Head injury instructions provided and they were instructed to return to the ER for concerns.  ____________________________________________   FINAL CLINICAL IMPRESSION(S) / ED DIAGNOSES  Final diagnoses:  Minor head injury, initial encounter  Hematoma of scalp, initial encounter      NEW MEDICATIONS STARTED DURING THIS VISIT:  Discharge Medication List as of 09/07/2016  5:13 PM       Note:  This document was prepared using Dragon voice recognition software and may include unintentional dictation errors.    Victorino Dike, FNP 09/09/16 1355    Rudene Re, MD 09/10/16 [phone]

## 2016-09-07 NOTE — Telephone Encounter (Signed)
Patient walks in states and states  she fell off stool reaching to get something out of cabinet and she has knot on back of head this occurred at 1:30pm .  She comes in with friend Manus Gunning.  Patient complains of knot being painful and has ice pack applied to back of head.   Patient states she has some foggy vision.  She saw eye doctor this am and he stated vision was fine.  I explained to patient that she needed to go to ER to be worked up for concussion work up  and to make sure there wasn't  a bleed.   Patient agreed and  Manus Gunning (friend ) agreed to take .   Patient plans to go to [facility].

## 2016-09-07 NOTE — ED Triage Notes (Signed)
Pt comes into the ED via POV c/o a fall that occurred earlier today.  Patient fell back off a step stool and hit her head on the floor.  Patient denies any LOC. Patient takes a baby aspirin but is unsure if she takes any other blood thinners.  Patient is neurologically intact and is A&Ox4.  Patient in NAD at this time with even and unlabored respirations.  Patient presents with hematoma to the back of her head.

## 2016-09-07 NOTE — ED Notes (Signed)
See triage note states she was standing on a stool  Fell backwards  Hit head   Small hematoma noted to back of head

## 2016-09-15 ENCOUNTER — Telehealth: Payer: Self-pay | Admitting: *Deleted

## 2016-09-15 ENCOUNTER — Encounter: Payer: Self-pay | Admitting: Emergency Medicine

## 2016-09-15 ENCOUNTER — Emergency Department: Payer: Medicare Other

## 2016-09-15 ENCOUNTER — Emergency Department
Admission: EM | Admit: 2016-09-15 | Discharge: 2016-09-15 | Disposition: A | Discharge status: Home / Self Care | Payer: Medicare Other | Attending: Emergency Medicine | Admitting: Emergency Medicine

## 2016-09-15 DIAGNOSIS — R55 Syncope and collapse: Secondary | ICD-10-CM | POA: Diagnosis not present

## 2016-09-15 DIAGNOSIS — Z7982 Long term (current) use of aspirin: Secondary | ICD-10-CM | POA: Diagnosis not present

## 2016-09-15 DIAGNOSIS — J449 Chronic obstructive pulmonary disease, unspecified: Secondary | ICD-10-CM | POA: Diagnosis not present

## 2016-09-15 DIAGNOSIS — Z79899 Other long term (current) drug therapy: Secondary | ICD-10-CM | POA: Diagnosis not present

## 2016-09-15 DIAGNOSIS — R110 Nausea: Secondary | ICD-10-CM | POA: Diagnosis not present

## 2016-09-15 DIAGNOSIS — R531 Weakness: Secondary | ICD-10-CM | POA: Diagnosis not present

## 2016-09-15 LAB — COMPREHENSIVE METABOLIC PANEL
ALT: 17 U/L (ref 14–54)
ANION GAP: 6 (ref 5–15)
AST: 25 U/L (ref 15–41)
Albumin: 3.8 g/dL (ref 3.5–5.0)
Alkaline Phosphatase: 52 U/L (ref 38–126)
BILIRUBIN TOTAL: 0.6 mg/dL (ref 0.3–1.2)
BUN: 18 mg/dL (ref 6–20)
CHLORIDE: 105 mmol/L (ref 101–111)
CO2: 28 mmol/L (ref 22–32)
Calcium: 8.9 mg/dL (ref 8.9–10.3)
Creatinine, Ser: 0.73 mg/dL (ref 0.44–1.00)
GFR calc Af Amer: >60 mL/min (ref >60)
Glucose, Bld: 107 mg/dL — ABNORMAL HIGH (ref 65–99)
POTASSIUM: 3.8 mmol/L (ref 3.5–5.1)
Sodium: 139 mmol/L (ref 135–145)
TOTAL PROTEIN: 6.2 g/dL — AB (ref 6.5–8.1)

## 2016-09-15 LAB — URINALYSIS, COMPLETE (UACMP) WITH MICROSCOPIC
Bacteria, UA: NONE SEEN
Bilirubin Urine: NEGATIVE
GLUCOSE, UA: NEGATIVE mg/dL
HGB URINE DIPSTICK: NEGATIVE
KETONES UR: NEGATIVE mg/dL
LEUKOCYTES UA: NEGATIVE
Nitrite: NEGATIVE
PROTEIN: NEGATIVE mg/dL
Specific Gravity, Urine: 1.016 (ref 1.005–1.030)
pH: 7 (ref 5.0–8.0)

## 2016-09-15 LAB — CBC WITH DIFFERENTIAL/PLATELET
Basophils Absolute: 0.1 10*3/uL (ref 0–0.1)
Basophils Relative: 1 %
EOS ABS: 0.2 10*3/uL (ref 0–0.7)
EOS PCT: 3 %
HCT: 38.8 % (ref 35.0–47.0)
Hemoglobin: 13.5 g/dL (ref 12.0–16.0)
LYMPHS ABS: 1.1 10*3/uL (ref 1.0–3.6)
Lymphocytes Relative: 15 %
MCH: 31.4 pg (ref 26.0–34.0)
MCHC: 34.7 g/dL (ref 32.0–36.0)
MCV: 90.4 fL (ref 80.0–100.0)
MONO ABS: 0.7 10*3/uL (ref 0.2–0.9)
MONOS PCT: 9 %
NEUTROS ABS: 5.4 10*3/uL (ref 1.4–6.5)
NEUTROS PCT: 72 %
PLATELETS: 255 10*3/uL (ref 150–440)
RBC: 4.29 MIL/uL (ref 3.80–5.20)
RDW: 14.2 % (ref 11.5–14.5)
WBC: 7.4 10*3/uL (ref 3.6–11.0)

## 2016-09-15 LAB — INFLUENZA PANEL BY PCR (TYPE A & B)
Influenza A By PCR: NEGATIVE
Influenza B By PCR: NEGATIVE

## 2016-09-15 LAB — LIPASE, BLOOD: LIPASE: 29 U/L (ref 11–51)

## 2016-09-15 LAB — TROPONIN I
Troponin I: <0.03 ng/mL (ref <0.03)
Troponin I: <0.03 ng/mL (ref <0.03)

## 2016-09-15 LAB — TSH: TSH: 1.544 u[IU]/mL (ref 0.350–4.500)

## 2016-09-15 NOTE — ED Triage Notes (Signed)
Pt presents to ED via [facility] from [employer] c/o "just not feeling right" that started around 0800 today. No unilateral limb weakness, no confusion. Blind in L eye. Pt called PCP who told her to call EMS and go to ED. Pt ambulatory to ambulance per EMS. VSS.

## 2016-09-15 NOTE — ED Provider Notes (Addendum)
[facility] Emergency Department Provider Note  ____________________________________________   First MD Initiated Contact with Patient 09/15/16 1129     (approximate)  I have reviewed the triage vital signs and the nursing notes.   HISTORY  Chief Complaint Weakness   HPI Christy Cisneros is a 83 y.o. female With a history of glaucoma as well as chronic bronchitis who is presenting to the emergency department today with weakness that started at about 8 AM. Says that she woke up feeling normal and then at about 8 AM started feelingweak. She denies any focal weakness.denies fever. Denies shortness of breath or cough.denies any pain.   Past Medical History:  Diagnosis Date  . Barrett's esophagus with esophagitis   . Chronic bronchitis ([place])   . GERD (gastroesophageal reflux disease)   . Glaucoma   . Hypercholesteremia   . Osteoarthritis   . Osteoporosis   . Sleep apnea    has CPAP  . TIA (transient ischemic attack)     Patient Active Problem List   Diagnosis Date Noted  . Memory change 09/04/2016  . Pneumonia 07/17/2016  . Mild cognitive impairment 05/15/2016  . Dizziness 04/21/2016  . Neck pain 02/11/2016  . Muscle cramps 11/14/2015  . Urine incontinence 08/16/2015  . Hematoma 08/16/2015  . Fatigue 07/12/2015  . Headache 06/16/2015  . Unsteady gait 06/01/2015  . GERD (gastroesophageal reflux disease) 08/11/2014  . UTI (urinary tract infection) 08/07/2014  . DYSPNEA 04/17/2009  . HYPERCHOLESTEROLEMIA 04/16/2009  . Obstructive sleep apnea 04/16/2009  . Glaucoma 04/16/2009  . Transient cerebral ischemia 04/16/2009  . BARRETTS ESOPHAGUS 04/16/2009  . OSTEOARTHRITIS 04/16/2009  . Osteoporosis 04/16/2009    Past Surgical History:  Procedure Laterality Date  . BUNIONECTOMY     left  . ROTATOR CUFF REPAIR     right    Prior to Admission medications   Medication Sig Start Date End Date Taking? Authorizing Provider  Aloe LIQD Take  by mouth daily.    Yes Historical Provider, MD  aspirin 81 MG tablet Take 81 mg by mouth daily.   Yes Historical Provider, MD  Bilberry, Vaccinium myrtillus, (BILBERRY PO) Take 1 capsule by mouth daily.   Yes Historical Provider, MD  calcium carbonate (OS-CAL) 600 MG TABS Take 600 mg by mouth daily.   Yes Historical Provider, MD  Flaxseed, Linseed, (GROUND FLAX SEEDS PO) Take by mouth daily.    Yes Historical Provider, MD  fluticasone (FLONASE) 50 MCG/ACT nasal spray Place 2 sprays into both nostrils daily. 07/22/16  Yes Einar Pheasant, MD  Multiple Vitamins-Minerals (PRESERVISION AREDS 2 PO) Take by mouth 2 (two) times daily.   Yes Historical Provider, MD  omeprazole (PRILOSEC) 20 MG capsule Take 20 mg by mouth daily.  05/12/15  Yes Historical Provider, MD  prednisoLONE acetate (PRED FORTE) 1 % ophthalmic suspension Place 1 drop into the left eye 2 (two) times daily.   Yes Historical Provider, MD  timolol (BETIMOL) 0.5 % ophthalmic solution Place 1 drop into the left eye daily.    Yes Historical Provider, MD    Allergies Alphagan [brimonidine]; Avelox [moxifloxacin hcl in nacl]; Bacitracin; Cefdinir; Cephalexin; Clarithromycin; Codeine; Erythromycin; Hydrocodone-acetaminophen; Lansoprazole; Polymyxin b; and Vigamox [moxifloxacin]  Family History  Problem Relation Age of Onset  . Breast cancer Sister   . Heart disease Mother     Social History Social History  Substance Use Topics  . Smoking status: Never Smoker  . Smokeless tobacco: Never Used  . Alcohol use 0.0 oz/week  Comment: wine/brandy occasional    Review of Systems Constitutional: No fever/chills Eyes: No visual changes. ENT: No sore throat. Cardiovascular: Denies chest pain. Respiratory: Denies shortness of breath. Gastrointestinal: No abdominal pain.  No nausea, no vomiting.  No diarrhea.  No constipation. Genitourinary: Negative for dysuria. Musculoskeletal: Negative for back pain. Skin: Negative for  rash. Neurological: Negative for headaches, focal weakness or numbness.  10-point ROS otherwise negative.  ____________________________________________   PHYSICAL EXAM:  VITAL SIGNS: ED Triage Vitals  Enc Vitals Group     BP 09/15/16 1132 135/70     Pulse Rate 09/15/16 1132 84     Resp 09/15/16 1132 16     Temp 09/15/16 1132 97.5 F (36.4 C)     Temp Source 09/15/16 1132 Oral     SpO2 09/15/16 1132 96 %     Weight 09/15/16 1133 99 lb 12.8 oz (45.3 kg)     Height 09/15/16 1133 5\' 2"  (1.575 m)     Head Circumference --      Peak Flow --      Pain Score 09/15/16 1135 0     Pain Loc --      Pain Edu? --      Excl. in [place]? --     Constitutional: Alert and oriented. Well appearing and in no acute distress. Eyes: Conjunctivae are normal. PERRL. EOMI. Head: Atraumatic. Nose: No congestion/rhinnorhea. Mouth/Throat: Mucous membranes are moist.  Neck: No stridor.   Cardiovascular: Normal rate, regular rhythm. Grossly normal heart sounds.   Respiratory: Normal respiratory effort.  No retractions. Lungs CTAB. Gastrointestinal: Soft and nontender. No distention. Musculoskeletal: No lower extremity tenderness nor edema.  No joint effusions. Neurologic:  Normal speech and language. No gross focal neurologic deficits are appreciated.  Skin:  Skin is warm, dry and intact. No rash noted. Psychiatric: Mood and affect are normal. Speech and behavior are normal.  ____________________________________________   LABS (all labs ordered are listed, but only abnormal results are displayed)  Labs Reviewed  COMPREHENSIVE METABOLIC PANEL - Abnormal; Notable for the following:       Result Value   Glucose, Bld 107 (*)    Total Protein 6.2 (*)    All other components within normal limits  URINALYSIS, COMPLETE (UACMP) WITH MICROSCOPIC - Abnormal; Notable for the following:    Color, Urine YELLOW (*)    APPearance CLEAR (*)    Squamous Epithelial / LPF 0-5 (*)    All other components within  normal limits  CBC WITH DIFFERENTIAL/PLATELET  LIPASE, BLOOD  TROPONIN I  TSH  INFLUENZA PANEL BY PCR (TYPE A & B)   ____________________________________________  EKG  ED ECG REPORT I, Doran Stabler, the attending physician, personally viewed and interpreted this ECG.   Date: 09/15/2016  EKG Time: 1136  Rate: 87  Rhythm: normal sinus rhythm  Axis: normal axis  Intervals:none  ST&T Change: machine read withminimal ST elevation. Minimal Elevation in II, III,f aVF versus baseline wander. No significant change from previous EKGs on the record. No T-wave inversions or reciprocal depressions. ____________________________________________  RADIOLOGY    CT Head Wo Contrast (Final result)  Result time 09/15/16 12:34:14  Final result by Lahoma Crocker, MD (09/15/16 12:34:14)           Narrative:   CLINICAL DATA: Weakness, not feeling right  EXAM: CT HEAD WITHOUT CONTRAST  TECHNIQUE: Contiguous axial images were obtained from the base of the skull through the vertex without intravenous contrast.  COMPARISON: 09/07/2016  FINDINGS:  Brain: No intracranial hemorrhage, mass effect or midline shift. No acute cortical infarction. Stable atrophy. Stable periventricular and patchy subcortical chronic white matter disease. No mass lesion is noted on this unenhanced scan.  Vascular: Mild atherosclerotic calcifications of carotid siphon.  Skull: No skull fracture is noted.  Sinuses/Orbits: No paranasal sinuses air-fluid level. Stable postsurgical changes bilateral eye globe.  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  IMPRESSION: No acute intracranial abnormality. Stable atrophy and chronic white matter disease. No definite acute cortical infarction. Stable postsurgical changes bilateral eye globe.   Electronically Signed By: Lahoma Crocker M.D. On: 09/15/2016 12:34            DG Chest 2 View (Final result)  Result time 09/15/16 12:21:55  Final  result by Inez Catalina, MD (09/15/16 12:21:55)           Narrative:   CLINICAL DATA: Weakness  EXAM: CHEST 2 VIEW  COMPARISON: 07/11/2016  FINDINGS: Cardiac shadow is mildly enlarged but stable. Aortic calcifications are again seen and stable. Lungs are hyperaerated consistent with COPD. No focal infiltrate or sizable effusion is seen. No bony abnormality is noted. Left shoulder replacement is seen.  IMPRESSION: COPD without acute abnormality.   Electronically Signed By: Inez Catalina M.D. On: 09/15/2016 12:21          ____________________________________________   PROCEDURES  Procedure(s) performed:   Procedures  Critical Care performed:   ____________________________________________   INITIAL IMPRESSION / ASSESSMENT AND PLAN / ED COURSE  Pertinent labs & imaging results that were available during my care of the patient were reviewed by me and considered in my medical decision making (see chart for details).  ----------------------------------------- 2:57 PM on 09/15/2016 -----------------------------------------  Patient says that she  Feels back to her baseline. Denying any pain. Denying any dizziness or syncopal sensation. She is now saying that she was feeling lightheaded this morning when she was both sitting and standing. Still denies any chest pain or shortness of breath. Very reassuring workup. We will check a second troponin, but if negative, the patient will be discharged home. I discussed this plan with her and she is understanding and willingness to comply.  Also with a plan to follow-up within 7 days with her primary care doctor. She is understanding of this plan and willing to comply.unclear etiology of the symptoms. However, no focal weakness or numbness. No pain.      ____________________________________________   FINAL CLINICAL IMPRESSION(S) / ED DIAGNOSES  Weakness. Near-syncope.    NEW MEDICATIONS STARTED DURING THIS  VISIT:  New Prescriptions   No medications on file     Note:  This document was prepared using Dragon voice recognition software and may include unintentional dictation errors.    Orbie Pyo, MD 09/15/16 1458  Equal and bilateral radial as well as dorsalis pedis pulses.    Orbie Pyo, MD 09/15/16 1500

## 2016-09-15 NOTE — Telephone Encounter (Signed)
Pt currently has symptoms of a nausea, pt feels as if she can fall at anytime,however she has no dizziness. Pt requested a appt. with Dr. Nicki Reaper Pt contact [phone]

## 2016-09-15 NOTE — Telephone Encounter (Signed)
Reason for call nausea ,  Symptoms: feels like she can fall  .  Afraid to walk  nauseated, Patient states " not sure what she is"   No chest pain or arm numbness  headache yesterday resolved today.  , Scared to walk without cane.  "don't know or  how to explain to it" Has a stiff neck"   Can't turn neck without hurting.  Duration this am  Medications: Tylenol yesterday for headache resolved.   Lives by herself , no appointments available today here urged patient to call 911   Patient advised to call 911 patient verbalized understanding  Called patient back and friend next door is taking her , she refused to call 911.

## 2016-09-15 NOTE — ED Notes (Addendum)
Pt assisted to use bathroom without any success. Pt being transported to [place].

## 2016-09-16 ENCOUNTER — Telehealth: Payer: Self-pay | Admitting: *Deleted

## 2016-09-16 DIAGNOSIS — H5211 Myopia, right eye: Secondary | ICD-10-CM | POA: Diagnosis not present

## 2016-09-16 DIAGNOSIS — H353132 Nonexudative age-related macular degeneration, bilateral, intermediate dry stage: Secondary | ICD-10-CM | POA: Diagnosis not present

## 2016-09-16 DIAGNOSIS — H04123 Dry eye syndrome of bilateral lacrimal glands: Secondary | ICD-10-CM | POA: Diagnosis not present

## 2016-09-16 DIAGNOSIS — H524 Presbyopia: Secondary | ICD-10-CM | POA: Diagnosis not present

## 2016-09-16 DIAGNOSIS — H52221 Regular astigmatism, right eye: Secondary | ICD-10-CM | POA: Diagnosis not present

## 2016-09-16 DIAGNOSIS — H401113 Primary open-angle glaucoma, right eye, severe stage: Secondary | ICD-10-CM | POA: Diagnosis not present

## 2016-09-16 DIAGNOSIS — H18832 Recurrent erosion of cornea, left eye: Secondary | ICD-10-CM | POA: Diagnosis not present

## 2016-09-16 DIAGNOSIS — H401123 Primary open-angle glaucoma, left eye, severe stage: Secondary | ICD-10-CM | POA: Diagnosis not present

## 2016-09-16 DIAGNOSIS — H47233 Glaucomatous optic atrophy, bilateral: Secondary | ICD-10-CM | POA: Diagnosis not present

## 2016-09-16 DIAGNOSIS — H35363 Drusen (degenerative) of macula, bilateral: Secondary | ICD-10-CM | POA: Diagnosis not present

## 2016-09-16 NOTE — Telephone Encounter (Signed)
Confirm doing ok.  If so, have her come at 12:15 on Tuesday 09/20/16.

## 2016-09-16 NOTE — Telephone Encounter (Signed)
Pt was seen in the ER on 09/15/16. Pt was advised to be seen in one week by her PCP Please give a time and date  Pt contact [phone]

## 2016-09-16 NOTE — Telephone Encounter (Signed)
I have looked at your schedule I do not see anywhere to put patient. Wanted to check and see how you would like me to schedule

## 2016-09-16 NOTE — Telephone Encounter (Signed)
Pt called given app feels a little better will call if any changes.

## 2016-09-19 DIAGNOSIS — H35363 Drusen (degenerative) of macula, bilateral: Secondary | ICD-10-CM | POA: Diagnosis not present

## 2016-09-19 DIAGNOSIS — H524 Presbyopia: Secondary | ICD-10-CM | POA: Diagnosis not present

## 2016-09-19 DIAGNOSIS — H47233 Glaucomatous optic atrophy, bilateral: Secondary | ICD-10-CM | POA: Diagnosis not present

## 2016-09-19 DIAGNOSIS — S0500XD Injury of conjunctiva and corneal abrasion without foreign body, unspecified eye, subsequent encounter: Secondary | ICD-10-CM | POA: Diagnosis not present

## 2016-09-19 DIAGNOSIS — H353132 Nonexudative age-related macular degeneration, bilateral, intermediate dry stage: Secondary | ICD-10-CM | POA: Diagnosis not present

## 2016-09-19 DIAGNOSIS — H401113 Primary open-angle glaucoma, right eye, severe stage: Secondary | ICD-10-CM | POA: Diagnosis not present

## 2016-09-19 DIAGNOSIS — H401123 Primary open-angle glaucoma, left eye, severe stage: Secondary | ICD-10-CM | POA: Diagnosis not present

## 2016-09-19 DIAGNOSIS — H5211 Myopia, right eye: Secondary | ICD-10-CM | POA: Diagnosis not present

## 2016-09-19 DIAGNOSIS — H52221 Regular astigmatism, right eye: Secondary | ICD-10-CM | POA: Diagnosis not present

## 2016-09-19 DIAGNOSIS — H04123 Dry eye syndrome of bilateral lacrimal glands: Secondary | ICD-10-CM | POA: Diagnosis not present

## 2016-09-20 ENCOUNTER — Ambulatory Visit (INDEPENDENT_AMBULATORY_CARE_PROVIDER_SITE_OTHER): Payer: Medicare Other | Admitting: Internal Medicine

## 2016-09-20 ENCOUNTER — Encounter: Payer: Self-pay | Admitting: Internal Medicine

## 2016-09-20 DIAGNOSIS — R5383 Other fatigue: Secondary | ICD-10-CM | POA: Diagnosis not present

## 2016-09-20 DIAGNOSIS — R413 Other amnesia: Secondary | ICD-10-CM

## 2016-09-20 DIAGNOSIS — E7800 Pure hypercholesterolemia, unspecified: Secondary | ICD-10-CM

## 2016-09-20 DIAGNOSIS — K2270 Barrett's esophagus without dysplasia: Secondary | ICD-10-CM

## 2016-09-20 DIAGNOSIS — K2100 Gastro-esophageal reflux disease with esophagitis, without bleeding: Secondary | ICD-10-CM

## 2016-09-20 NOTE — Progress Notes (Signed)
Pre visit review using our clinic review tool, if applicable. No additional management support is needed unless otherwise documented below in the visit note. 

## 2016-09-20 NOTE — Patient Instructions (Signed)
prilosec (omeprazole) 20mg  - take one tablet per day (take 30 minutes before breakfast)

## 2016-09-20 NOTE — Progress Notes (Signed)
Patient ID: Christy Cisneros, female   DOB: 06-Jan-1920, 83 y.o.   MRN: [id number]   Subjective:    Patient ID: Christy Cisneros, female    DOB: 07/03/1920, 83 y.o.   MRN: [id number]  HPI  Patient here for ER follow up.  She was seen 09/15/16 with weakness.  Note reviewed.  CT revealed no acute intracranial abnormality.  CXR no acute abnormality.   W/up unrevealing.  Discharged.  States she feels better.  No chest pain.  Is eating.  No nausea or vomiting.  Bowels moving.  No abdominal pain.  Increased acid reflux.  No urine change.     Past Medical History:  Diagnosis Date  . Barrett's esophagus with esophagitis   . Chronic bronchitis ([place])   . GERD (gastroesophageal reflux disease)   . Glaucoma   . Hypercholesteremia   . Osteoarthritis   . Osteoporosis   . Sleep apnea    has CPAP  . TIA (transient ischemic attack)    Past Surgical History:  Procedure Laterality Date  . BUNIONECTOMY     left  . ROTATOR CUFF REPAIR     right   Family History  Problem Relation Age of Onset  . Breast cancer Sister   . Heart disease Mother    Social History   Social History  . Marital status: Single    Spouse name: N/A  . Number of children: N/A  . Years of education: N/A   Social History Main Topics  . Smoking status: Never Smoker  . Smokeless tobacco: Never Used  . Alcohol use 0.0 oz/week     Comment: wine/brandy occasional  . Drug use: No  . Sexual activity: No   Other Topics Concern  . None   Social History Narrative  . None    Outpatient Encounter Prescriptions as of 09/20/2016  Medication Sig  . Aloe LIQD Take by mouth daily.   Marland Kitchen aspirin 81 MG tablet Take 81 mg by mouth daily.  . Bilberry, Vaccinium myrtillus, (BILBERRY PO) Take 1 capsule by mouth daily.  . calcium carbonate (OS-CAL) 600 MG TABS Take 600 mg by mouth daily.  . Flaxseed, Linseed, (GROUND FLAX SEEDS PO) Take by mouth daily.   . fluticasone (FLONASE) 50 MCG/ACT nasal spray Place 2 sprays into both  nostrils daily.  . Multiple Vitamins-Minerals (PRESERVISION AREDS 2 PO) Take by mouth 2 (two) times daily.  Marland Kitchen omeprazole (PRILOSEC) 20 MG capsule Take 20 mg by mouth daily.   . prednisoLONE acetate (PRED FORTE) 1 % ophthalmic suspension Place 1 drop into the left eye 2 (two) times daily.  . timolol (BETIMOL) 0.5 % ophthalmic solution Place 1 drop into the left eye daily.   Marland Kitchen tobramycin (TOBREX) 0.3 % ophthalmic solution    No facility-administered encounter medications on file as of 09/20/2016.     Review of Systems  Constitutional: Positive for fatigue. Negative for appetite change.  HENT: Negative for congestion and sinus pressure.   Respiratory: Negative for cough, chest tightness and shortness of breath.   Cardiovascular: Negative for chest pain, palpitations and leg swelling.  Gastrointestinal: Negative for abdominal pain, diarrhea, nausea and vomiting.  Genitourinary: Negative for difficulty urinating and dysuria.  Musculoskeletal: Negative for back pain and joint swelling.  Skin: Negative for color change and rash.  Neurological: Negative for dizziness, light-headedness and headaches.  Psychiatric/Behavioral: Negative for agitation and dysphoric mood.       Objective:    Physical Exam  Constitutional: She appears well-developed and well-nourished.  No distress.  HENT:  Nose: Nose normal.  Mouth/Throat: Oropharynx is clear and moist.  Neck: Neck supple. No thyromegaly present.  Cardiovascular: Normal rate and regular rhythm.   Pulmonary/Chest: Breath sounds normal. No respiratory distress. She has no wheezes.  Abdominal: Soft. Bowel sounds are normal. There is no tenderness.  Musculoskeletal: She exhibits no edema or tenderness.  Lymphadenopathy:    She has no cervical adenopathy.  Skin: No rash noted. No erythema.  Psychiatric: She has a normal mood and affect. Her behavior is normal.    BP 128/76 (BP Location: Left Arm, Patient Position: Sitting, Cuff Size: Small)    Pulse 77   Temp 97.8 F (36.6 C) (Oral)   Resp 16   Wt 106 lb 2 oz (48.1 kg)   SpO2 98%   BMI 19.41 kg/m  Wt Readings from Last 3 Encounters:  09/20/16 106 lb 2 oz (48.1 kg)  09/15/16 99 lb 12.8 oz (45.3 kg)  09/07/16 102 lb (46.3 kg)     Lab Results  Component Value Date   WBC 7.4 09/15/2016   HGB 13.5 09/15/2016   HCT 38.8 09/15/2016   PLT 255 09/15/2016   GLUCOSE 107 (H) 09/15/2016   CHOL 220 (H) 06/16/2015   TRIG 64.0 06/16/2015   HDL 82.20 06/16/2015   LDLCALC 125 (H) 06/16/2015   ALT 17 09/15/2016   AST 25 09/15/2016   NA 139 09/15/2016   K 3.8 09/15/2016   CL 105 09/15/2016   CREATININE 0.73 09/15/2016   BUN 18 09/15/2016   CO2 28 09/15/2016   TSH 1.544 09/15/2016    Dg Chest 2 View  Result Date: 09/15/2016 CLINICAL DATA:  Weakness EXAM: CHEST  2 VIEW COMPARISON:  07/11/2016 FINDINGS: Cardiac shadow is mildly enlarged but stable. Aortic calcifications are again seen and stable. Lungs are hyperaerated consistent with COPD. No focal infiltrate or sizable effusion is seen. No bony abnormality is noted. Left shoulder replacement is seen. IMPRESSION: COPD without acute abnormality. Electronically Signed   By: Inez Catalina M.D.   On: 09/15/2016 12:21   Ct Head Wo Contrast  Result Date: 09/15/2016 CLINICAL DATA:  Weakness, not feeling right EXAM: CT HEAD WITHOUT CONTRAST TECHNIQUE: Contiguous axial images were obtained from the base of the skull through the vertex without intravenous contrast. COMPARISON:  09/07/2016 FINDINGS: Brain: No intracranial hemorrhage, mass effect or midline shift. No acute cortical infarction. Stable atrophy. Stable periventricular and patchy subcortical chronic white matter disease. No mass lesion is noted on this unenhanced scan. Vascular: Mild atherosclerotic calcifications of carotid siphon. Skull: No skull fracture is noted. Sinuses/Orbits: No paranasal sinuses air-fluid level. Stable postsurgical changes bilateral eye globe. 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 IMPRESSION: No acute intracranial abnormality. Stable atrophy and chronic white matter disease. No definite acute cortical infarction. Stable postsurgical changes bilateral eye globe. Electronically Signed   By: Lahoma Crocker M.D.   On: 09/15/2016 12:34       Assessment & Plan:   Problem List Items Addressed This Visit    BARRETTS ESOPHAGUS    Restart omeprazole.  Has declined f/u EGD.       Fatigue    Feeling better.  Recent labs in ER.  Follow.        GERD (gastroesophageal reflux disease)    Some increased acid reflux recently.  Has declined f/u EGD.  Restart omeprazole.        HYPERCHOLESTEROLEMIA    Follow lipid panel.  Memory change    Is concerned regarding her memory.  Request referral to neurology.            Einar Pheasant, MD

## 2016-09-26 DIAGNOSIS — H401113 Primary open-angle glaucoma, right eye, severe stage: Secondary | ICD-10-CM | POA: Diagnosis not present

## 2016-09-26 DIAGNOSIS — H04123 Dry eye syndrome of bilateral lacrimal glands: Secondary | ICD-10-CM | POA: Diagnosis not present

## 2016-09-26 DIAGNOSIS — H353132 Nonexudative age-related macular degeneration, bilateral, intermediate dry stage: Secondary | ICD-10-CM | POA: Diagnosis not present

## 2016-09-26 DIAGNOSIS — H35363 Drusen (degenerative) of macula, bilateral: Secondary | ICD-10-CM | POA: Diagnosis not present

## 2016-09-26 DIAGNOSIS — H5211 Myopia, right eye: Secondary | ICD-10-CM | POA: Diagnosis not present

## 2016-09-26 DIAGNOSIS — H47233 Glaucomatous optic atrophy, bilateral: Secondary | ICD-10-CM | POA: Diagnosis not present

## 2016-09-26 DIAGNOSIS — H52221 Regular astigmatism, right eye: Secondary | ICD-10-CM | POA: Diagnosis not present

## 2016-09-26 DIAGNOSIS — H524 Presbyopia: Secondary | ICD-10-CM | POA: Diagnosis not present

## 2016-09-26 DIAGNOSIS — H401123 Primary open-angle glaucoma, left eye, severe stage: Secondary | ICD-10-CM | POA: Diagnosis not present

## 2016-09-26 DIAGNOSIS — S0500XD Injury of conjunctiva and corneal abrasion without foreign body, unspecified eye, subsequent encounter: Secondary | ICD-10-CM | POA: Diagnosis not present

## 2016-10-02 ENCOUNTER — Encounter: Payer: Self-pay | Admitting: Internal Medicine

## 2016-10-02 NOTE — Assessment & Plan Note (Signed)
Restart omeprazole.  Has declined f/u EGD.

## 2016-10-02 NOTE — Assessment & Plan Note (Signed)
Is concerned regarding her memory.  Request referral to neurology.

## 2016-10-02 NOTE — Assessment & Plan Note (Signed)
Follow lipid panel.   

## 2016-10-02 NOTE — Assessment & Plan Note (Signed)
Some increased acid reflux recently.  Has declined f/u EGD.  Restart omeprazole.

## 2016-10-02 NOTE — Assessment & Plan Note (Signed)
Feeling better.  Recent labs in ER.  Follow.

## 2016-10-04 ENCOUNTER — Telehealth: Payer: Self-pay | Admitting: Internal Medicine

## 2016-10-04 NOTE — Telephone Encounter (Signed)
FYI

## 2016-10-04 NOTE — Telephone Encounter (Signed)
Pt called and stated that she is doing well with the omeprazole (PRILOSEC) 20 MG capsule and just wanted to let you know.

## 2016-10-14 ENCOUNTER — Emergency Department: Payer: Medicare Other

## 2016-10-14 ENCOUNTER — Encounter: Payer: Self-pay | Admitting: Emergency Medicine

## 2016-10-14 ENCOUNTER — Emergency Department
Admission: EM | Admit: 2016-10-14 | Discharge: 2016-10-14 | Disposition: A | Discharge status: Home / Self Care | Payer: Medicare Other | Attending: Emergency Medicine | Admitting: Emergency Medicine

## 2016-10-14 DIAGNOSIS — Z79899 Other long term (current) drug therapy: Secondary | ICD-10-CM | POA: Insufficient documentation

## 2016-10-14 DIAGNOSIS — K529 Noninfective gastroenteritis and colitis, unspecified: Secondary | ICD-10-CM | POA: Diagnosis not present

## 2016-10-14 DIAGNOSIS — K5730 Diverticulosis of large intestine without perforation or abscess without bleeding: Secondary | ICD-10-CM | POA: Diagnosis not present

## 2016-10-14 DIAGNOSIS — R1030 Lower abdominal pain, unspecified: Secondary | ICD-10-CM | POA: Diagnosis present

## 2016-10-14 DIAGNOSIS — R197 Diarrhea, unspecified: Secondary | ICD-10-CM | POA: Diagnosis not present

## 2016-10-14 DIAGNOSIS — Z7982 Long term (current) use of aspirin: Secondary | ICD-10-CM | POA: Diagnosis not present

## 2016-10-14 LAB — CBC WITH DIFFERENTIAL/PLATELET
BASOS ABS: 0.1 10*3/uL (ref 0–0.1)
BASOS PCT: 1 %
EOS ABS: 0.5 10*3/uL (ref 0–0.7)
Eosinophils Relative: 7 %
HEMATOCRIT: 40.5 % (ref 35.0–47.0)
HEMOGLOBIN: 14 g/dL (ref 12.0–16.0)
Lymphocytes Relative: 16 %
Lymphs Abs: 1.2 10*3/uL (ref 1.0–3.6)
MCH: 31.4 pg (ref 26.0–34.0)
MCHC: 34.6 g/dL (ref 32.0–36.0)
MCV: 90.7 fL (ref 80.0–100.0)
Monocytes Absolute: 0.8 10*3/uL (ref 0.2–0.9)
Monocytes Relative: 10 %
NEUTROS ABS: 5.2 10*3/uL (ref 1.4–6.5)
Neutrophils Relative %: 66 %
Platelets: 244 10*3/uL (ref 150–440)
RBC: 4.46 MIL/uL (ref 3.80–5.20)
RDW: 14.3 % (ref 11.5–14.5)
WBC: 7.7 10*3/uL (ref 3.6–11.0)

## 2016-10-14 LAB — HEPATIC FUNCTION PANEL
ALBUMIN: 3.6 g/dL (ref 3.5–5.0)
ALK PHOS: 47 U/L (ref 38–126)
ALT: 20 U/L (ref 14–54)
AST: 26 U/L (ref 15–41)
BILIRUBIN INDIRECT: 1 mg/dL — AB (ref 0.3–0.9)
Bilirubin, Direct: 0.1 mg/dL (ref 0.1–0.5)
TOTAL PROTEIN: 6.2 g/dL — AB (ref 6.5–8.1)
Total Bilirubin: 1.1 mg/dL (ref 0.3–1.2)

## 2016-10-14 LAB — BASIC METABOLIC PANEL
Anion gap: 6 (ref 5–15)
BUN: 17 mg/dL (ref 6–20)
CHLORIDE: 106 mmol/L (ref 101–111)
CO2: 27 mmol/L (ref 22–32)
Calcium: 9 mg/dL (ref 8.9–10.3)
Creatinine, Ser: 0.93 mg/dL (ref 0.44–1.00)
GFR, EST AFRICAN AMERICAN: 59 mL/min — AB (ref >60)
GFR, EST NON AFRICAN AMERICAN: 51 mL/min — AB (ref >60)
Glucose, Bld: 94 mg/dL (ref 65–99)
POTASSIUM: 3.6 mmol/L (ref 3.5–5.1)
Sodium: 139 mmol/L (ref 135–145)

## 2016-10-14 LAB — LIPASE, BLOOD: LIPASE: 15 U/L (ref 11–51)

## 2016-10-14 LAB — PROTIME-INR
INR: 0.97
Prothrombin Time: 12.9 seconds (ref 11.4–15.2)

## 2016-10-14 LAB — TROPONIN I: Troponin I: <0.03 ng/mL (ref <0.03)

## 2016-10-14 LAB — LACTIC ACID, PLASMA: LACTIC ACID, VENOUS: 1 mmol/L (ref 0.5–1.9)

## 2016-10-14 MED ORDER — SODIUM CHLORIDE 0.9 % IV BOLUS (SEPSIS)
1000.0000 mL | Freq: Once | INTRAVENOUS | Status: AC
  Administered 2016-10-14: 1000 mL via INTRAVENOUS

## 2016-10-14 MED ORDER — MORPHINE SULFATE (PF) 4 MG/ML IV SOLN
4.0000 mg | Freq: Once | INTRAVENOUS | Status: AC
  Administered 2016-10-14: 4 mg via INTRAVENOUS
  Filled 2016-10-14: qty 1

## 2016-10-14 MED ORDER — IOPAMIDOL (ISOVUE-300) INJECTION 61%
75.0000 mL | Freq: Once | INTRAVENOUS | Status: AC | PRN
  Administered 2016-10-14: 75 mL via INTRAVENOUS

## 2016-10-14 MED ORDER — ONDANSETRON HCL 4 MG PO TABS: 4.0000 mg | ORAL_TABLET | Freq: Every day | ORAL | 1 refills | Status: DC | PRN

## 2016-10-14 MED ORDER — OXYCODONE-ACETAMINOPHEN 5-325 MG PO TABS: 2.0000 | ORAL_TABLET | Freq: Once | ORAL | Status: DC

## 2016-10-14 MED ORDER — IOPAMIDOL (ISOVUE-300) INJECTION 61%
15.0000 mL | INTRAVENOUS | Status: AC
  Administered 2016-10-14 (×2): 15 mL via ORAL

## 2016-10-14 MED ORDER — OXYCODONE-ACETAMINOPHEN 5-325 MG PO TABS: 1.0000 | ORAL_TABLET | Freq: Four times a day (QID) | ORAL | 0 refills | Status: DC | PRN

## 2016-10-14 MED ORDER — ONDANSETRON HCL 4 MG/2ML IJ SOLN
4.0000 mg | Freq: Once | INTRAMUSCULAR | Status: AC
  Administered 2016-10-14: 4 mg via INTRAVENOUS
  Filled 2016-10-14: qty 2

## 2016-10-14 NOTE — ED Triage Notes (Signed)
Pt arrived via ems from home with complaints of persistent diarrhea and lower abdominal pain.

## 2016-10-14 NOTE — ED Provider Notes (Signed)
[facility] Emergency Department Provider Note  ____________________________________________   First MD Initiated Contact with Patient 10/14/16 [phone]     (approximate)  I have reviewed the triage vital signs and the nursing notes.   HISTORY  Chief Complaint Diarrhea    HPI Christy Cisneros is a 83 y.o. female who comes to the emergency department via EMS with 1 day of cramping lower abdominal discomfort. She's had several loose stools and has vomited several times. She denies past surgical history. She denies fevers or chills. She has not tried to eat. Her pain is moderate severity cramping constant. She denies chest pain or shortness of breath. She denies dysuria frequency or hesitancy. She had normal vital signs en route.   Past Medical History:  Diagnosis Date  . Barrett's esophagus with esophagitis   . Chronic bronchitis ([place])   . GERD (gastroesophageal reflux disease)   . Glaucoma   . Hypercholesteremia   . Osteoarthritis   . Osteoporosis   . Sleep apnea    has CPAP  . TIA (transient ischemic attack)     Patient Active Problem List   Diagnosis Date Noted  . Memory change 09/04/2016  . Pneumonia 07/17/2016  . Mild cognitive impairment 05/15/2016  . Dizziness 04/21/2016  . Neck pain 02/11/2016  . Muscle cramps 11/14/2015  . Urine incontinence 08/16/2015  . Hematoma 08/16/2015  . Fatigue 07/12/2015  . Headache 06/16/2015  . Unsteady gait 06/01/2015  . GERD (gastroesophageal reflux disease) 08/11/2014  . UTI (urinary tract infection) 08/07/2014  . DYSPNEA 04/17/2009  . HYPERCHOLESTEROLEMIA 04/16/2009  . Obstructive sleep apnea 04/16/2009  . Glaucoma 04/16/2009  . Transient cerebral ischemia 04/16/2009  . BARRETTS ESOPHAGUS 04/16/2009  . OSTEOARTHRITIS 04/16/2009  . Osteoporosis 04/16/2009    Past Surgical History:  Procedure Laterality Date  . BUNIONECTOMY     left  . ROTATOR CUFF REPAIR     right    Prior to Admission  medications   Medication Sig Start Date End Date Taking? Authorizing Provider  Aloe LIQD Take by mouth daily.     Historical Provider, MD  aspirin 81 MG tablet Take 81 mg by mouth daily.    Historical Provider, MD  Bilberry, Vaccinium myrtillus, (BILBERRY PO) Take 1 capsule by mouth daily.    Historical Provider, MD  calcium carbonate (OS-CAL) 600 MG TABS Take 600 mg by mouth daily.    Historical Provider, MD  Flaxseed, Linseed, (GROUND FLAX SEEDS PO) Take by mouth daily.     Historical Provider, MD  fluticasone (FLONASE) 50 MCG/ACT nasal spray Place 2 sprays into both nostrils daily. 07/22/16   Einar Pheasant, MD  Multiple Vitamins-Minerals (PRESERVISION AREDS 2 PO) Take by mouth 2 (two) times daily.    Historical Provider, MD  omeprazole (PRILOSEC) 20 MG capsule Take 20 mg by mouth daily.  05/12/15   Historical Provider, MD  ondansetron (ZOFRAN) 4 MG tablet Take 1 tablet (4 mg total) by mouth daily as needed for nausea or vomiting. 10/14/16 10/14/17  Darel Hong, MD  oxyCODONE-acetaminophen (ROXICET) 5-325 MG tablet Take 1 tablet by mouth every 6 (six) hours as needed. 10/14/16   Darel Hong, MD  prednisoLONE acetate (PRED FORTE) 1 % ophthalmic suspension Place 1 drop into the left eye 2 (two) times daily.    Historical Provider, MD  timolol (BETIMOL) 0.5 % ophthalmic solution Place 1 drop into the left eye daily.     Historical Provider, MD  tobramycin (TOBREX) 0.3 % ophthalmic solution  09/16/16  Historical Provider, MD    Allergies Alphagan [brimonidine]; Avelox [moxifloxacin hcl in nacl]; Bacitracin; Cefdinir; Cephalexin; Clarithromycin; Codeine; Erythromycin; Hydrocodone-acetaminophen; Lansoprazole; Polymyxin b; and Vigamox [moxifloxacin]  Family History  Problem Relation Age of Onset  . Breast cancer Sister   . Heart disease Mother     Social History Social History  Substance Use Topics  . Smoking status: Never Smoker  . Smokeless tobacco: Never Used  . Alcohol use 0.0  oz/week     Comment: wine/brandy occasional    Review of Systems Constitutional: No fever/chills Eyes: No visual changes. ENT: No sore throat. Cardiovascular: Denies chest pain. Respiratory: Denies shortness of breath. Gastrointestinal: Positive abdominal pain.  Positive nausea, positive vomiting.  Positive diarrhea.  No constipation. Genitourinary: Negative for dysuria. Musculoskeletal: Negative for back pain. Skin: Negative for rash. Neurological: Negative for headaches, focal weakness or numbness.  10-point ROS otherwise negative.  ____________________________________________   PHYSICAL EXAM:  VITAL SIGNS: ED Triage Vitals  Enc Vitals Group     BP      Pulse      Resp      Temp      Temp src      SpO2      Weight      Height      Head Circumference      Peak Flow      Pain Score      Pain Loc      Pain Edu?      Excl. in [place]?     Constitutional: Alert and oriented x 4 well appearing nontoxic no diaphoresis speaks in full, clear sentences Eyes: PERRL EOMI. Head: Atraumatic. Nose: No congestion/rhinnorhea. Mouth/Throat: No trismus Neck: No stridor.   Cardiovascular: Normal rate, regular rhythm. Grossly normal heart sounds.  Good peripheral circulation. Respiratory: Normal respiratory effort.  No retractions. Lungs CTAB and moving good air Gastrointestinal: Soft nondistended nontender no rebound no guarding no peritonitis no McBurney's tenderness negative Rovsing's no costovertebral tenderness negative Murphy's Musculoskeletal: No lower extremity edema   Neurologic:  Normal speech and language. No gross focal neurologic deficits are appreciated. Skin:  Skin is warm, dry and intact. No rash noted. Psychiatric: Mood and affect are normal. Speech and behavior are normal.    ____________________________________________   DIFFERENTIAL  Appendicitis, diverticulitis, enteritis, pancreatitis, acute coronary syndrome ____________________________________________     LABS (all labs ordered are listed, but only abnormal results are displayed)  Labs Reviewed  BASIC METABOLIC PANEL - Abnormal; Notable for the following:       Result Value   GFR calc non Af Amer 51 (*)    GFR calc Af Amer 59 (*)    All other components within normal limits  HEPATIC FUNCTION PANEL - Abnormal; Notable for the following:    Total Protein 6.2 (*)    Indirect Bilirubin 1.0 (*)    All other components within normal limits  LIPASE, BLOOD  TROPONIN I  CBC WITH DIFFERENTIAL/PLATELET  PROTIME-INR  LACTIC ACID, PLASMA  URINALYSIS, COMPLETE (UACMP) WITH MICROSCOPIC  LACTIC ACID, PLASMA    No signs of acute ischemia __________________________________________  EKG  ED ECG REPORT I, Darel Hong, the attending physician, personally viewed and interpreted this ECG.  Date: 10/14/2016 Rate: 72 Rhythm: normal sinus rhythm QRS Axis: normal Intervals: normal ST/T Wave abnormalities: normal Conduction Disturbances: none Narrative Interpretation: unremarkable  ____________________________________________  RADIOLOGY  CT shows enteritis but no acute surgical disease ____________________________________________   PROCEDURES  Procedure(s) performed: no  Procedures  Critical Care performed:  no  ____________________________________________   INITIAL IMPRESSION / ASSESSMENT AND PLAN / ED COURSE  Pertinent labs & imaging results that were available during my care of the patient were reviewed by me and considered in my medical decision making (see chart for details).  While the patient is well-appearing with a benign abdominal exam, her advanced age makes the utility of the physical exam challenging. I will check general labs including a lactate, troponin, and she warrants a CT scan of her abdomen pelvis with IV contrast.     ----------------------------------------- 6:46 AM on 10/14/2016 -----------------------------------------  The patient remains very  well-appearing with a benign abdominal exam. Fortunately her CT scan is negative for acute surgical pathology and shows enteritis which is most likely viral. She is alert he taking loperamide at home so I'll add on Zofran and give her follow-up with her primary care physician as needed. She is discharged home in improved and good condition. ____________________________________________   FINAL CLINICAL IMPRESSION(S) / ED DIAGNOSES  Final diagnoses:  Enteritis      NEW MEDICATIONS STARTED DURING THIS VISIT:  New Prescriptions   ONDANSETRON (ZOFRAN) 4 MG TABLET    Take 1 tablet (4 mg total) by mouth daily as needed for nausea or vomiting.   OXYCODONE-ACETAMINOPHEN (ROXICET) 5-325 MG TABLET    Take 1 tablet by mouth every 6 (six) hours as needed.     Note:  This document was prepared using Dragon voice recognition software and may include unintentional dictation errors.     Darel Hong, MD 10/14/16 [phone]

## 2016-10-14 NOTE — Discharge Instructions (Addendum)
The most important thing free to do is to stay well-hydrated. Please use your Zofran as needed to make sure you do not throw up and make sure you take lots of liquids by mouth. He is a tablet of loperamide after every loose stool. Follow-up with your primary care physician on Monday as needed. Return to the emergency department sooner for any new or worsening symptoms such as if she cannot eat or drink, for worsening pain, or for any other concerns.  It was a pleasure to take care of you today, and thank you for coming to our emergency department.  If you have any questions or concerns before leaving please ask the nurse to grab me and I'm more than happy to go through your aftercare instructions again.  If you were prescribed any opioid pain medication today such as Norco, Vicodin, Percocet, morphine, hydrocodone, or oxycodone please make sure you do not drive when you are taking this medication as it can alter your ability to drive safely.  If you have any concerns once you are home that you are not improving or are in fact getting worse before you can make it to your follow-up appointment, please do not hesitate to call 911 and come back for further evaluation.  Darel Hong MD  Results for orders placed or performed during the hospital encounter of 40/98/11  Basic metabolic panel  Result Value Ref Range   Sodium 139 135 - 145 mmol/L   Potassium 3.6 3.5 - 5.1 mmol/L   Chloride 106 101 - 111 mmol/L   CO2 27 22 - 32 mmol/L   Glucose, Bld 94 65 - 99 mg/dL   BUN 17 6 - 20 mg/dL   Creatinine, Ser 0.93 0.44 - 1.00 mg/dL   Calcium 9.0 8.9 - 10.3 mg/dL   GFR calc non Af Amer 51 (L) >60 mL/min   GFR calc Af Amer 59 (L) >60 mL/min   Anion gap 6 5 - 15  Hepatic function panel  Result Value Ref Range   Total Protein 6.2 (L) 6.5 - 8.1 g/dL   Albumin 3.6 3.5 - 5.0 g/dL   AST 26 15 - 41 U/L   ALT 20 14 - 54 U/L   Alkaline Phosphatase 47 38 - 126 U/L   Total Bilirubin 1.1 0.3 - 1.2 mg/dL   Bilirubin, Direct 0.1 0.1 - 0.5 mg/dL   Indirect Bilirubin 1.0 (H) 0.3 - 0.9 mg/dL  Lipase, blood  Result Value Ref Range   Lipase 15 11 - 51 U/L  Troponin I  Result Value Ref Range   Troponin I <0.03 <0.03 ng/mL  CBC with Differential  Result Value Ref Range   WBC 7.7 3.6 - 11.0 K/uL   RBC 4.46 3.80 - 5.20 MIL/uL   Hemoglobin 14.0 12.0 - 16.0 g/dL   HCT 40.5 35.0 - 47.0 %   MCV 90.7 80.0 - 100.0 fL   MCH 31.4 26.0 - 34.0 pg   MCHC 34.6 32.0 - 36.0 g/dL   RDW 14.3 11.5 - 14.5 %   Platelets 244 150 - 440 K/uL   Neutrophils Relative % 66 %   Neutro Abs 5.2 1.4 - 6.5 K/uL   Lymphocytes Relative 16 %   Lymphs Abs 1.2 1.0 - 3.6 K/uL   Monocytes Relative 10 %   Monocytes Absolute 0.8 0.2 - 0.9 K/uL   Eosinophils Relative 7 %   Eosinophils Absolute 0.5 0 - 0.7 K/uL   Basophils Relative 1 %   Basophils Absolute  0.1 0 - 0.1 K/uL  Protime-INR  Result Value Ref Range   Prothrombin Time 12.9 11.4 - 15.2 seconds   INR 0.97   Lactic acid, plasma  Result Value Ref Range   Lactic Acid, Venous 1.0 0.5 - 1.9 mmol/L   Dg Chest 2 View  Result Date: 09/15/2016 CLINICAL DATA:  Weakness EXAM: CHEST  2 VIEW COMPARISON:  07/11/2016 FINDINGS: Cardiac shadow is mildly enlarged but stable. Aortic calcifications are again seen and stable. Lungs are hyperaerated consistent with COPD. No focal infiltrate or sizable effusion is seen. No bony abnormality is noted. Left shoulder replacement is seen. IMPRESSION: COPD without acute abnormality. Electronically Signed   By: Inez Catalina M.D.   On: 09/15/2016 12:21   Ct Head Wo Contrast  Result Date: 09/15/2016 CLINICAL DATA:  Weakness, not feeling right EXAM: CT HEAD WITHOUT CONTRAST TECHNIQUE: Contiguous axial images were obtained from the base of the skull through the vertex without intravenous contrast. COMPARISON:  09/07/2016 FINDINGS: Brain: No intracranial hemorrhage, mass effect or midline shift. No acute cortical infarction. Stable atrophy. Stable  periventricular and patchy subcortical chronic white matter disease. No mass lesion is noted on this unenhanced scan. Vascular: Mild atherosclerotic calcifications of carotid siphon. Skull: No skull fracture is noted. Sinuses/Orbits: No paranasal sinuses air-fluid level. Stable postsurgical changes bilateral eye globe. 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 IMPRESSION: No acute intracranial abnormality. Stable atrophy and chronic white matter disease. No definite acute cortical infarction. Stable postsurgical changes bilateral eye globe. Electronically Signed   By: Lahoma Crocker M.D.   On: 09/15/2016 12:34   Ct Abdomen Pelvis W Contrast  Result Date: 10/14/2016 CLINICAL DATA:  Persistent diarrhea and lower abdominal pain. Vomiting. EXAM: CT ABDOMEN AND PELVIS WITH CONTRAST TECHNIQUE: Multidetector CT imaging of the abdomen and pelvis was performed using the standard protocol following bolus administration of intravenous contrast. CONTRAST:  70mL ISOVUE-300 IOPAMIDOL (ISOVUE-300) INJECTION 61% COMPARISON:  None. FINDINGS: Lower chest: Atelectasis or infiltration in the lung bases. Cardiac enlargement. Coronary artery calcification. Small esophageal hiatal hernia. Hepatobiliary: No focal liver abnormality is seen. No gallstones, gallbladder wall thickening, or biliary dilatation. Pancreas: Unremarkable. No pancreatic ductal dilatation or surrounding inflammatory changes. Spleen: Normal in size without focal abnormality. Adrenals/Urinary Tract: Adrenal glands are unremarkable. Kidneys are normal, without renal calculi, focal lesion, or hydronephrosis. Bladder is unremarkable. Stomach/Bowel: Stomach, small bowel, and colon are not abnormally distended. Some small bowel loops demonstrate wall thickening suggesting enteritis or possibly inflammatory bowel disease. Diverticulosis of the sigmoid colon. No definite evidence of diverticulitis. Vascular/Lymphatic: Aortic atherosclerosis. No enlarged abdominal  or pelvic lymph nodes. Reproductive: Status post hysterectomy. No adnexal masses. Other: No abdominal wall hernia or abnormality. No abdominopelvic ascites. Musculoskeletal: Scoliosis and degenerative changes of the lumbar spine. Degenerative changes in the hips. No destructive bone lesions. IMPRESSION: Areas of small bowel wall thickening suggesting enteritis or inflammatory bowel disease. Diverticulosis of the colon without evidence of diverticulitis. Electronically Signed   By: Lucienne Capers M.D.   On: 10/14/2016 06:28

## 2016-11-01 DIAGNOSIS — R413 Other amnesia: Secondary | ICD-10-CM | POA: Diagnosis not present

## 2016-11-01 DIAGNOSIS — G3184 Mild cognitive impairment, so stated: Secondary | ICD-10-CM | POA: Diagnosis not present

## 2016-11-02 DIAGNOSIS — H524 Presbyopia: Secondary | ICD-10-CM | POA: Diagnosis not present

## 2016-11-02 DIAGNOSIS — H35363 Drusen (degenerative) of macula, bilateral: Secondary | ICD-10-CM | POA: Diagnosis not present

## 2016-11-02 DIAGNOSIS — H401123 Primary open-angle glaucoma, left eye, severe stage: Secondary | ICD-10-CM | POA: Diagnosis not present

## 2016-11-02 DIAGNOSIS — H401113 Primary open-angle glaucoma, right eye, severe stage: Secondary | ICD-10-CM | POA: Diagnosis not present

## 2016-11-02 DIAGNOSIS — H52221 Regular astigmatism, right eye: Secondary | ICD-10-CM | POA: Diagnosis not present

## 2016-11-02 DIAGNOSIS — S0500XD Injury of conjunctiva and corneal abrasion without foreign body, unspecified eye, subsequent encounter: Secondary | ICD-10-CM | POA: Diagnosis not present

## 2016-11-02 DIAGNOSIS — H47233 Glaucomatous optic atrophy, bilateral: Secondary | ICD-10-CM | POA: Diagnosis not present

## 2016-11-02 DIAGNOSIS — H353132 Nonexudative age-related macular degeneration, bilateral, intermediate dry stage: Secondary | ICD-10-CM | POA: Diagnosis not present

## 2016-11-02 DIAGNOSIS — H04123 Dry eye syndrome of bilateral lacrimal glands: Secondary | ICD-10-CM | POA: Diagnosis not present

## 2016-11-02 DIAGNOSIS — H5211 Myopia, right eye: Secondary | ICD-10-CM | POA: Diagnosis not present

## 2016-11-15 ENCOUNTER — Other Ambulatory Visit: Payer: Self-pay | Admitting: Internal Medicine

## 2016-11-16 ENCOUNTER — Telehealth: Payer: Self-pay | Admitting: Internal Medicine

## 2016-11-16 DIAGNOSIS — M1611 Unilateral primary osteoarthritis, right hip: Secondary | ICD-10-CM | POA: Diagnosis not present

## 2016-11-16 DIAGNOSIS — M5136 Other intervertebral disc degeneration, lumbar region: Secondary | ICD-10-CM | POA: Diagnosis not present

## 2016-11-16 DIAGNOSIS — M25551 Pain in right hip: Secondary | ICD-10-CM | POA: Diagnosis not present

## 2016-11-16 DIAGNOSIS — M5417 Radiculopathy, lumbosacral region: Secondary | ICD-10-CM | POA: Diagnosis not present

## 2016-11-16 NOTE — Telephone Encounter (Signed)
  I have reviewed the above information and agree with above.   Lexington Devine, MD 

## 2016-11-16 NOTE — Telephone Encounter (Signed)
Patient arrived at office with complaint of right hip pain rated at an 8 now this morning trying to get ut of bed was rated 10 +. Denies falling, pain started at this morning. Spoke with Physician in office and was advised patient should with transportation issues be seen at [facility]gent care in case CT needed and due to no appointment in office for treatment.   Vitals  BP 120/74 pulse 91 02 sat 96% T 97.6.

## 2016-11-17 DIAGNOSIS — M1611 Unilateral primary osteoarthritis, right hip: Secondary | ICD-10-CM | POA: Diagnosis not present

## 2016-11-17 DIAGNOSIS — M5136 Other intervertebral disc degeneration, lumbar region: Secondary | ICD-10-CM | POA: Diagnosis not present

## 2016-11-17 NOTE — Telephone Encounter (Signed)
Talked with patient she stated her pain is mainly in the mornings when she wakes and tries to get out of bed at that time her pain is 10 out 10 on pain scale. Ask patient to call person she received the pain medication from and find  out the name of that medication, patient says it helped her a lot. From patients description might have been just Advil. I scheduled patient for next Thursday day at 1:30 the only available spot I could find. I have copied report from [place] and pasted to word so and printed looks like X-rays were ordered but patient did not have them completed. Patient wanted something sooner if possible.

## 2016-11-17 NOTE — Telephone Encounter (Signed)
PT called back and stated that she went to Urgent care yesterday and the doctor there told her that her pain was coming from her back. Pt states that she was not given any medication and wants the pain to go away and to see Dr. Nicki Reaper. Please advise, thank you!  Call pt @ [phone]

## 2016-11-17 NOTE — Telephone Encounter (Signed)
I called the patient back, she is very drowsy on the phone.  She states that she went to the [facility] urgent care yesterday thinking it was the ER, but they did nothing.  She was sent home with only knowing that the pain was from her back not her hip.  She was desperate today and it sounded like her daughter gave her two pills for pain, patient is not sure what there were....she stated "I really messed up today".  She says that she has the pain when she is trying to get up from the bed or chair and it feels like " a metal pole is being stuck into her right side".  She wants to see Dr. Nicki Reaper, but may have transportation issues.  Please advise.

## 2016-11-17 NOTE — Telephone Encounter (Signed)
Need xray report from [facility].  Unable to view report from xrays on our system (from [facility]).  Also, please call pt and see how doing now and see if has tried tylenol for pain as directed.  Unable to work in today.  I can see her next week, but if more acute issues, will need to be evaluated earlier.

## 2016-11-18 NOTE — Telephone Encounter (Signed)
Left message for patient to return all to office. 

## 2016-11-18 NOTE — Telephone Encounter (Signed)
Reviewed xray reports.  Her right hip xray reveals arthritis changes, no acute findings.  Back xray reveals scoliosis and arthritis changes.  Recommend tylenol.  If any acute change prior to appt, will need to be evaluated.

## 2016-11-21 DIAGNOSIS — H6123 Impacted cerumen, bilateral: Secondary | ICD-10-CM | POA: Diagnosis not present

## 2016-11-21 DIAGNOSIS — H903 Sensorineural hearing loss, bilateral: Secondary | ICD-10-CM | POA: Diagnosis not present

## 2016-11-22 NOTE — Telephone Encounter (Signed)
Left message call office

## 2016-11-24 ENCOUNTER — Encounter: Payer: Self-pay | Admitting: Internal Medicine

## 2016-11-24 ENCOUNTER — Ambulatory Visit (INDEPENDENT_AMBULATORY_CARE_PROVIDER_SITE_OTHER): Payer: Medicare Other | Admitting: Internal Medicine

## 2016-11-24 VITALS — BP 130/76 | HR 89 | Temp 98.2°F | Resp 12 | Ht 62.0 in | Wt 104.6 lb

## 2016-11-24 DIAGNOSIS — L989 Disorder of the skin and subcutaneous tissue, unspecified: Secondary | ICD-10-CM

## 2016-11-24 DIAGNOSIS — E7800 Pure hypercholesterolemia, unspecified: Secondary | ICD-10-CM | POA: Diagnosis not present

## 2016-11-24 DIAGNOSIS — R413 Other amnesia: Secondary | ICD-10-CM | POA: Diagnosis not present

## 2016-11-24 DIAGNOSIS — K2270 Barrett's esophagus without dysplasia: Secondary | ICD-10-CM | POA: Diagnosis not present

## 2016-11-24 NOTE — Progress Notes (Signed)
Patient ID: Christy Cisneros, female   DOB: [birth year], 83 y.o.   MRN: [id number]   Subjective:    Patient ID: Christy Cisneros, female    DOB: 18-Jan-1920, 83 y.o.   MRN: [id number]  HPI  Patient here for a scheduled follow up.  She was having increased pain in her back.  Was seen 11/16/16 at acute care.  Diagnosed with DDD/DJD.  They had discussed advil.  She is better now.  Back is better.  Discussed stopping advil with GI issues.  Can take tylenol if needed. Eating.  No nausea or vomiting.  Bowels moving.  Was seen in ER for lower abdominal discomfort.  Note reviewed.  CT reviewed.  Found to have small bowel wall thickening.  Diagnosed with enteritis.  GI symptoms have resolved now.  Discussed further w/up and evaluation.  She declines.     Past Medical History:  Diagnosis Date  . Barrett's esophagus with esophagitis   . Chronic bronchitis ([place])   . GERD (gastroesophageal reflux disease)   . Glaucoma   . Hypercholesteremia   . Osteoarthritis   . Osteoporosis   . Sleep apnea    has CPAP  . TIA (transient ischemic attack)    Past Surgical History:  Procedure Laterality Date  . BUNIONECTOMY     left  . ROTATOR CUFF REPAIR     right   Family History  Problem Relation Age of Onset  . Breast cancer Sister   . Heart disease Mother    Social History   Social History  . Marital status: Single    Spouse name: N/A  . Number of children: N/A  . Years of education: N/A   Social History Main Topics  . Smoking status: Never Smoker  . Smokeless tobacco: Never Used  . Alcohol use 0.0 oz/week     Comment: wine/brandy occasional  . Drug use: No  . Sexual activity: No   Other Topics Concern  . None   Social History Narrative  . None    Outpatient Encounter Prescriptions as of 11/24/2016  Medication Sig  . Aloe LIQD Take by mouth daily.   Marland Kitchen aspirin 81 MG tablet Take 81 mg by mouth daily.  . Bilberry, Vaccinium myrtillus, (BILBERRY PO) Take 1 capsule by mouth daily.  .  calcium carbonate (OS-CAL) 600 MG TABS Take 600 mg by mouth daily.  . Flaxseed, Linseed, (GROUND FLAX SEEDS PO) Take by mouth daily.   . fluticasone (FLONASE) 50 MCG/ACT nasal spray Place 2 sprays into both nostrils daily.  . Multiple Vitamins-Minerals (PRESERVISION AREDS 2 PO) Take by mouth 2 (two) times daily.  Marland Kitchen omeprazole (PRILOSEC) 20 MG capsule TAKE ONE CAPSULE TWICE A DAY BEFORE MEALS  . ondansetron (ZOFRAN) 4 MG tablet Take 1 tablet (4 mg total) by mouth daily as needed for nausea or vomiting.  . prednisoLONE acetate (PRED FORTE) 1 % ophthalmic suspension Place 1 drop into the left eye 2 (two) times daily.  . timolol (BETIMOL) 0.5 % ophthalmic solution Place 1 drop into the left eye daily.   Marland Kitchen tobramycin (TOBREX) 0.3 % ophthalmic solution   . [DISCONTINUED] oxyCODONE-acetaminophen (ROXICET) 5-325 MG tablet Take 1 tablet by mouth every 6 (six) hours as needed.   No facility-administered encounter medications on file as of 11/24/2016.     Review of Systems  Constitutional: Negative for appetite change and unexpected weight change.  HENT: Negative for congestion and sinus pressure.   Respiratory: Negative for cough, chest tightness and shortness of  breath.   Cardiovascular: Negative for chest pain, palpitations and leg swelling.  Gastrointestinal: Negative for abdominal pain, diarrhea, nausea and vomiting.  Genitourinary: Negative for difficulty urinating and dysuria.  Musculoskeletal:       Back is better.    Skin: Negative for color change and rash.  Neurological: Negative for dizziness, light-headedness and headaches.  Psychiatric/Behavioral: Negative for agitation and dysphoric mood.       Objective:    Physical Exam  Constitutional: She appears well-developed and well-nourished. No distress.  HENT:  Nose: Nose normal.  Mouth/Throat: Oropharynx is clear and moist.  Neck: Neck supple. No thyromegaly present.  Cardiovascular: Normal rate and regular rhythm.     Pulmonary/Chest: Breath sounds normal. No respiratory distress. She has no wheezes.  Abdominal: Soft. Bowel sounds are normal. There is no tenderness.  Musculoskeletal: She exhibits no edema or tenderness.  Lymphadenopathy:    She has no cervical adenopathy.  Skin: No rash noted. No erythema.  Psychiatric: She has a normal mood and affect. Her behavior is normal.    BP 130/76 (BP Location: Left Arm, Patient Position: Sitting, Cuff Size: Normal)   Pulse 89   Temp 98.2 F (36.8 C) (Oral)   Resp 12   Ht 5\' 2"  (1.575 m)   Wt 104 lb 9.6 oz (47.4 kg)   SpO2 97%   BMI 19.13 kg/m  Wt Readings from Last 3 Encounters:  11/24/16 104 lb 9.6 oz (47.4 kg)  10/14/16 105 lb (47.6 kg)  09/20/16 106 lb 2 oz (48.1 kg)     Lab Results  Component Value Date   WBC 7.7 10/14/2016   HGB 14.0 10/14/2016   HCT 40.5 10/14/2016   PLT 244 10/14/2016   GLUCOSE 94 10/14/2016   CHOL 220 (H) 06/16/2015   TRIG 64.0 06/16/2015   HDL 82.20 06/16/2015   LDLCALC 125 (H) 06/16/2015   ALT 20 10/14/2016   AST 26 10/14/2016   NA 139 10/14/2016   K 3.6 10/14/2016   CL 106 10/14/2016   CREATININE 0.93 10/14/2016   BUN 17 10/14/2016   CO2 27 10/14/2016   TSH 1.544 09/15/2016   INR 0.97 10/14/2016    Ct Abdomen Pelvis W Contrast  Result Date: 10/14/2016 CLINICAL DATA:  Persistent diarrhea and lower abdominal pain. Vomiting. EXAM: CT ABDOMEN AND PELVIS WITH CONTRAST TECHNIQUE: Multidetector CT imaging of the abdomen and pelvis was performed using the standard protocol following bolus administration of intravenous contrast. CONTRAST:  9mL ISOVUE-300 IOPAMIDOL (ISOVUE-300) INJECTION 61% COMPARISON:  None. FINDINGS: Lower chest: Atelectasis or infiltration in the lung bases. Cardiac enlargement. Coronary artery calcification. Small esophageal hiatal hernia. Hepatobiliary: No focal liver abnormality is seen. No gallstones, gallbladder wall thickening, or biliary dilatation. Pancreas: Unremarkable. No pancreatic  ductal dilatation or surrounding inflammatory changes. Spleen: Normal in size without focal abnormality. Adrenals/Urinary Tract: Adrenal glands are unremarkable. Kidneys are normal, without renal calculi, focal lesion, or hydronephrosis. Bladder is unremarkable. Stomach/Bowel: Stomach, small bowel, and colon are not abnormally distended. Some small bowel loops demonstrate wall thickening suggesting enteritis or possibly inflammatory bowel disease. Diverticulosis of the sigmoid colon. No definite evidence of diverticulitis. Vascular/Lymphatic: Aortic atherosclerosis. No enlarged abdominal or pelvic lymph nodes. Reproductive: Status post hysterectomy. No adnexal masses. Other: No abdominal wall hernia or abnormality. No abdominopelvic ascites. Musculoskeletal: Scoliosis and degenerative changes of the lumbar spine. Degenerative changes in the hips. No destructive bone lesions. IMPRESSION: Areas of small bowel wall thickening suggesting enteritis or inflammatory bowel disease. Diverticulosis of the colon without  evidence of diverticulitis. Electronically Signed   By: Lucienne Capers M.D.   On: 10/14/2016 06:28       Assessment & Plan:   Problem List Items Addressed This Visit    BARRETTS ESOPHAGUS    Discussed the need to take her omeprazole regularly.  She declines further evaluation/w/up, etc.        HYPERCHOLESTEROLEMIA    Follow lipid panel.        Memory change    Saw neurology.  Stable.  Note reviewed.         Other Visit Diagnoses    Facial skin lesion    -  Primary   Refer to dermatology.         Einar Pheasant, MD

## 2016-11-24 NOTE — Progress Notes (Signed)
Pre-visit discussion using our clinic review tool. No additional management support is needed unless otherwise documented below in the visit note.  

## 2016-11-25 ENCOUNTER — Telehealth: Payer: Self-pay | Admitting: *Deleted

## 2016-11-25 NOTE — Telephone Encounter (Signed)
Called patient she is not having any symptoms did not mean to call and sent message after she thought about it?? Will discuss any test she would like done at next o/v

## 2016-11-25 NOTE — Telephone Encounter (Signed)
Patient has requested to have a order to give a urine sample. Pt was last seen on 04/26 Pt contact [phone]

## 2016-11-25 NOTE — Telephone Encounter (Signed)
Left message to return call to our office.  

## 2016-11-29 ENCOUNTER — Telehealth: Payer: Self-pay | Admitting: Internal Medicine

## 2016-11-29 DIAGNOSIS — L989 Disorder of the skin and subcutaneous tissue, unspecified: Secondary | ICD-10-CM

## 2016-11-29 NOTE — Telephone Encounter (Signed)
Please advise 

## 2016-11-29 NOTE — Telephone Encounter (Signed)
Order placed for dermatology referral.  

## 2016-11-29 NOTE — Telephone Encounter (Signed)
Pt called wanting a referral to see the dermatologist in [place]. Please advise? Thank you!

## 2016-12-01 ENCOUNTER — Telehealth: Payer: Self-pay | Admitting: Internal Medicine

## 2016-12-01 ENCOUNTER — Ambulatory Visit: Payer: Medicare Other | Admitting: Internal Medicine

## 2016-12-01 NOTE — Telephone Encounter (Signed)
To be filed.

## 2016-12-01 NOTE — Telephone Encounter (Signed)
Pt dropped off a CD from [facility]. CD is up front in Dr. Bary Leriche color folder.

## 2016-12-01 NOTE — Telephone Encounter (Signed)
Put in blue folder for you to review

## 2016-12-03 ENCOUNTER — Encounter: Payer: Self-pay | Admitting: Internal Medicine

## 2016-12-03 NOTE — Assessment & Plan Note (Signed)
Discussed the need to take her omeprazole regularly.  She declines further evaluation/w/up, etc.

## 2016-12-03 NOTE — Assessment & Plan Note (Signed)
Follow lipid panel.   

## 2016-12-03 NOTE — Assessment & Plan Note (Signed)
Saw neurology.  Stable.  Note reviewed.

## 2016-12-14 ENCOUNTER — Ambulatory Visit (INDEPENDENT_AMBULATORY_CARE_PROVIDER_SITE_OTHER): Payer: Medicare Other | Admitting: Family

## 2016-12-14 ENCOUNTER — Encounter: Payer: Self-pay | Admitting: Family

## 2016-12-14 VITALS — BP 130/72 | HR 76 | Temp 97.6°F | Ht 62.0 in

## 2016-12-14 DIAGNOSIS — S81801A Unspecified open wound, right lower leg, initial encounter: Secondary | ICD-10-CM

## 2016-12-14 MED ORDER — DOXYCYCLINE HYCLATE 100 MG PO TABS: 100.0000 mg | ORAL_TABLET | Freq: Two times a day (BID) | ORAL | 0 refills | Status: DC

## 2016-12-14 NOTE — Patient Instructions (Addendum)
Continue to watch for signs of infection  Will start oral antibiotic to ensure no infection  Ensure to take probiotics while on antibiotics and also for 2 weeks after completion. It is important to re-colonize the gut with good bacteria and also to prevent any diarrheal infections associated with antibiotic use.   Referral to wound clinic placed

## 2016-12-14 NOTE — Progress Notes (Signed)
Subjective:    Patient ID: Christy Cisneros, female    DOB: 1919-08-17, 83 y.o.   MRN: [id number]  CC: Christy Cisneros is a 83 y.o. female who presents today for an acute visit.    HPI: CC: right wound to leg x 2 weeks ago which occurred when on step stool and hit leg on side of table.  wound keeps reoccuring and not improving.   No foul smell or discharge from wound.   No fever, chills, N, V.   Overall feels well today.   Taking aspirin.   No sob; h/o dvt    HISTORY:  Past Medical History:  Diagnosis Date  . Barrett's esophagus with esophagitis   . Chronic bronchitis ([place])   . GERD (gastroesophageal reflux disease)   . Glaucoma   . Hypercholesteremia   . Osteoarthritis   . Osteoporosis   . Sleep apnea    has CPAP  . TIA (transient ischemic attack)    Past Surgical History:  Procedure Laterality Date  . BUNIONECTOMY     left  . ROTATOR CUFF REPAIR     right   Family History  Problem Relation Age of Onset  . Breast cancer Sister   . Heart disease Mother     Allergies: Alphagan [brimonidine]; Avelox [moxifloxacin hcl in nacl]; Bacitracin; Cefdinir; Cephalexin; Clarithromycin; Codeine; Erythromycin; Hydrocodone-acetaminophen; Lansoprazole; Polymyxin b; and Vigamox [moxifloxacin] Current Outpatient Prescriptions on File Prior to Visit  Medication Sig Dispense Refill  . Aloe LIQD Take by mouth daily.     Marland Kitchen aspirin 81 MG tablet Take 81 mg by mouth daily.    . Bilberry, Vaccinium myrtillus, (BILBERRY PO) Take 1 capsule by mouth daily.    . calcium carbonate (OS-CAL) 600 MG TABS Take 600 mg by mouth daily.    . Flaxseed, Linseed, (GROUND FLAX SEEDS PO) Take by mouth daily.     . fluticasone (FLONASE) 50 MCG/ACT nasal spray Place 2 sprays into both nostrils daily. 16 g 0  . Multiple Vitamins-Minerals (PRESERVISION AREDS 2 PO) Take by mouth 2 (two) times daily.    Marland Kitchen omeprazole (PRILOSEC) 20 MG capsule TAKE ONE CAPSULE TWICE A DAY BEFORE MEALS 120 capsule 1  .  ondansetron (ZOFRAN) 4 MG tablet Take 1 tablet (4 mg total) by mouth daily as needed for nausea or vomiting. 30 tablet 1  . prednisoLONE acetate (PRED FORTE) 1 % ophthalmic suspension Place 1 drop into the left eye 2 (two) times daily.    . timolol (BETIMOL) 0.5 % ophthalmic solution Place 1 drop into the left eye daily.     Marland Kitchen tobramycin (TOBREX) 0.3 % ophthalmic solution      No current facility-administered medications on file prior to visit.     Social History  Substance Use Topics  . Smoking status: Never Smoker  . Smokeless tobacco: Never Used  . Alcohol use 0.0 oz/week     Comment: wine/brandy occasional    Review of Systems  Constitutional: Negative for chills and fever.  Respiratory: Negative for cough and shortness of breath.   Cardiovascular: Negative for chest pain, palpitations and leg swelling.  Gastrointestinal: Negative for nausea and vomiting.  Skin: Positive for wound.      Objective:    BP 130/72   Pulse 76   Temp 97.6 F (36.4 C) (Oral)   Ht 5\' 2"  (1.575 m)   SpO2 93%    Physical Exam  Constitutional: She appears well-developed and well-nourished.  Eyes: Conjunctivae are normal.  Cardiovascular:  Normal rate, regular rhythm, normal heart sounds and normal pulses.   Palpable pedal pulses. BLE extremities warm.   Pulmonary/Chest: Effort normal and breath sounds normal. She has no wheezes. She has no rhonchi. She has no rales.  Neurological: She is alert.  Skin: Skin is warm and dry.     Skin tear as marked on diagram.  3.5cm x 3.5 cm.   No bleeding  Mild surrounding erythema, tenderness with palpation.. No purulent discharge ,or  foul odor.   Psychiatric: She has a normal mood and affect. Her speech is normal and behavior is normal. Thought content normal.  Vitals reviewed.      Assessment & Plan:   1. Wound of right lower extremity, initial encounter Skin tear. Afebrile. No gross signs of infection however patient did have mild tenderness and  erythema surrounding wound. This was localized. Due to her age, fragility, decided to cover her with oral antibiotic. Placed referral to wound care as per patient this wound has been recurrent. CMA dressed wound prior to patient leaving.  - AMB referral to wound care center - doxycycline (VIBRA-TABS) 100 MG tablet; Take 1 tablet (100 mg total) by mouth 2 (two) times daily.  Dispense: 10 tablet; Refill: 0    I am having Christy Cisneros maintain her prednisoLONE acetate, timolol, calcium carbonate, (Bilberry, Vaccinium myrtillus, (BILBERRY PO)), aspirin, Multiple Vitamins-Minerals (PRESERVISION AREDS 2 PO), (Flaxseed, Linseed, (GROUND FLAX SEEDS PO)), Aloe, fluticasone, tobramycin, ondansetron, and omeprazole.   No orders of the defined types were placed in this encounter.   Return precautions given.   Risks, benefits, and alternatives of the medications and treatment plan prescribed today were discussed, and patient expressed understanding.   Education regarding symptom management and diagnosis given to patient on AVS.  Continue to follow with Christy Pheasant, MD for routine health maintenance.   Christy Cisneros and I agreed with plan.   Christy Paris, FNP

## 2016-12-14 NOTE — Progress Notes (Signed)
Pre visit review using our clinic review tool, if applicable. No additional management support is needed unless otherwise documented below in the visit note. 

## 2016-12-16 ENCOUNTER — Telehealth: Payer: Self-pay | Admitting: *Deleted

## 2016-12-16 NOTE — Telephone Encounter (Signed)
Patient has requested to know the updates on her referral for the wound clinic. Pt contact [phone]

## 2016-12-16 NOTE — Telephone Encounter (Signed)
Referral was sent to [facility]. They will call her to schedule appt. I talked to her on 5/17 and told her that they will call her.

## 2016-12-20 ENCOUNTER — Encounter: Payer: Medicare Other | Attending: Internal Medicine | Admitting: Internal Medicine

## 2016-12-20 DIAGNOSIS — I87323 Chronic venous hypertension (idiopathic) with inflammation of bilateral lower extremity: Secondary | ICD-10-CM | POA: Insufficient documentation

## 2016-12-20 DIAGNOSIS — Z881 Allergy status to other antibiotic agents status: Secondary | ICD-10-CM | POA: Diagnosis not present

## 2016-12-20 DIAGNOSIS — S81811D Laceration without foreign body, right lower leg, subsequent encounter: Secondary | ICD-10-CM | POA: Diagnosis not present

## 2016-12-20 DIAGNOSIS — G4730 Sleep apnea, unspecified: Secondary | ICD-10-CM | POA: Insufficient documentation

## 2016-12-20 DIAGNOSIS — Z888 Allergy status to other drugs, medicaments and biological substances status: Secondary | ICD-10-CM | POA: Diagnosis not present

## 2016-12-20 DIAGNOSIS — X58XXXD Exposure to other specified factors, subsequent encounter: Secondary | ICD-10-CM | POA: Insufficient documentation

## 2016-12-20 DIAGNOSIS — M1990 Unspecified osteoarthritis, unspecified site: Secondary | ICD-10-CM | POA: Insufficient documentation

## 2016-12-20 DIAGNOSIS — Z885 Allergy status to narcotic agent status: Secondary | ICD-10-CM | POA: Diagnosis not present

## 2016-12-20 DIAGNOSIS — H409 Unspecified glaucoma: Secondary | ICD-10-CM | POA: Diagnosis not present

## 2016-12-20 DIAGNOSIS — I87311 Chronic venous hypertension (idiopathic) with ulcer of right lower extremity: Secondary | ICD-10-CM | POA: Diagnosis not present

## 2016-12-20 DIAGNOSIS — L97212 Non-pressure chronic ulcer of right calf with fat layer exposed: Secondary | ICD-10-CM | POA: Diagnosis not present

## 2016-12-21 DIAGNOSIS — H5211 Myopia, right eye: Secondary | ICD-10-CM | POA: Diagnosis not present

## 2016-12-21 DIAGNOSIS — H353131 Nonexudative age-related macular degeneration, bilateral, early dry stage: Secondary | ICD-10-CM | POA: Diagnosis not present

## 2016-12-21 DIAGNOSIS — H524 Presbyopia: Secondary | ICD-10-CM | POA: Diagnosis not present

## 2016-12-21 DIAGNOSIS — H47233 Glaucomatous optic atrophy, bilateral: Secondary | ICD-10-CM | POA: Diagnosis not present

## 2016-12-21 DIAGNOSIS — H401113 Primary open-angle glaucoma, right eye, severe stage: Secondary | ICD-10-CM | POA: Diagnosis not present

## 2016-12-21 DIAGNOSIS — H04123 Dry eye syndrome of bilateral lacrimal glands: Secondary | ICD-10-CM | POA: Diagnosis not present

## 2016-12-21 DIAGNOSIS — H35363 Drusen (degenerative) of macula, bilateral: Secondary | ICD-10-CM | POA: Diagnosis not present

## 2016-12-21 DIAGNOSIS — H401123 Primary open-angle glaucoma, left eye, severe stage: Secondary | ICD-10-CM | POA: Diagnosis not present

## 2016-12-21 DIAGNOSIS — H52221 Regular astigmatism, right eye: Secondary | ICD-10-CM | POA: Diagnosis not present

## 2016-12-21 DIAGNOSIS — H1820 Unspecified corneal edema: Secondary | ICD-10-CM | POA: Diagnosis not present

## 2016-12-21 NOTE — Progress Notes (Signed)
Christy Cisneros, PRANGE ([id number]) Visit Report for 12/20/2016 Chief Complaint Document Details AMBERLE, LYTER Date of Service: 12/20/2016 10:45 AM Patient Name: M. Patient Account Number: [id number] Medical Record Treating RN: Ahmed Prima [id number] Number: Other Clinician: Date of Birth/Sex: 1920/05/30 (83 y.o. Female) Treating Shanti Eichel Primary Care Provider: Einar Pheasant Provider/Extender: G Referring Provider: Rondel Baton in Treatment: 0 Information Obtained from: Patient Chief Complaint Right calf traumatic ulcer since early Dec 2015. 12/20/16; Patient here for a review of a non healing traumatic wound on the right lateral leg Electronic Signature(s) Signed: 12/20/2016 3:44:58 PM By: Linton Ham MD Entered By: Linton Ham on 12/20/2016 12:29:49 Christy Cisneros ([id number]) -------------------------------------------------------------------------------- Debridement Details Christy Cisneros Date of Service: 12/20/2016 10:45 AM Patient Name: Christy Cisneros. Patient Account Number: [id number] Medical Record Treating RN: Ahmed Prima [id number] Number: Other Clinician: Date of Birth/Sex: 1920-02-08 (83 y.o. Female) Treating Riely Baskett, [place] Primary Care Provider: Einar Pheasant Provider/Extender: G Referring Provider: Mable Paris Weeks in Treatment: 0 Debridement Performed for Wound #2 Right,Lateral Lower Leg Assessment: Performed By: Physician Ricard Dillon, MD Debridement: Debridement Severity of Tissue Pre Fat layer exposed Debridement: Pre-procedure Verification/Time Out Yes - 11:31 Taken: Start Time: 11:30 Pain Control: Lidocaine 4% Topical Solution Level: Skin/Subcutaneous Tissue Total Area Debrided (L x 3 (cm) x 2 (cm) = 6 (cm) W): Tissue and other Viable, Non-Viable, Exudate, Fibrin/Slough, Subcutaneous material debrided: Instrument: Blade, Curette, Forceps Bleeding: Minimum Hemostasis Achieved: Pressure End Time:  11:35 Procedural Pain: 0 Post Procedural Pain: 0 Response to Treatment: Procedure was tolerated well Post Debridement Measurements of Total Wound Length: (cm) 3 Width: (cm) 2 Depth: (cm) 0.1 Volume: (cm) 0.471 Character of Wound/Ulcer Post Requires Further Debridement Debridement: Severity of Tissue Post Debridement: Fat layer exposed Post Procedure Diagnosis Same as Pre-procedure Electronic Signature(s) IRMGARD, RAMPERSAUD ([id number]) Signed: 12/20/2016 3:34:08 PM By: Alric Quan Signed: 12/20/2016 3:44:58 PM By: Linton Ham MD Entered By: Linton Ham on 12/20/2016 12:28:48 Christy Cisneros ([id number]) -------------------------------------------------------------------------------- HPI Details Christy Cisneros Date of Service: 12/20/2016 10:45 AM Patient Name: Christy Cisneros. Patient Account Number: [id number] Medical Record Treating RN: Ahmed Prima [id number] Number: Other Clinician: Date of Birth/Sex: 1920/04/08 (83 y.o. Female) Treating Christy Cisneros Primary Care Provider: Einar Pheasant Provider/Extender: G Referring Provider: Mable Paris Weeks in Treatment: 0 History of Present Illness HPI Description: Very pleasant 83 year old with no significant past medical history. No diabetes or peripheral vascular disease. She traumatized her right anterior calf on a car door on 07/08/2014. She is slowly improving with regular debridements, compression, and. No significant pain. No claudication or rest pain. Right ABI 1.02. She walks a mile a day. No drainage. No fever or chills. She returns to clinic today and says that the ulceration has healed. 12/20/16; This is a 83 year old women who traumatized her right leg against a table while adjusting a shade in her home. She suffered a skin tear. She was seen 2 weeks ago in her primary doctor's office and had a non stick dressing placed with kerlix and coban and that has apparently remained in place since then.  She is complaining of pain but no systemic symptoms. I note she has been in this clinic 3 years ago for a similar presentation on the right leg.She was not seen by any of the current providers in our practice. She has compression stockings at home but does not use them. She has venous insufficiency. ABI's in this clinic 1.09 Electronic Signature(s) Signed: 12/20/2016 3:44:58 PM By: Linton Ham MD Entered By: Linton Ham on  12/20/2016 12:42:13 Christy, Cisneros ([id number]) -------------------------------------------------------------------------------- Physical Exam Details Christy Cisneros Date of Service: 12/20/2016 10:45 AM Patient Name: M. Patient Account Number: [id number] Medical Record Treating RN: Ahmed Prima [id number] Number: Other Clinician: Date of Birth/Sex: Dec 11, 1919 (83 y.o. Female) Treating Caedence Snowden Primary Care Provider: Einar Pheasant Provider/Extender: G Referring Provider: Mable Paris Weeks in Treatment: 0 Constitutional Sitting or standing Blood Pressure is within target range for patient.. Pulse regular and within target range for patient.. Temperature is normal and within the target range for the patient.. Patient is allert and appears to be in no distress. Eyes Conjunctivae clear. No discharge.Marland Kitchen Respiratory kyphotic. Cardiovascular Femoral arteries without bruits and pulses strong.. Pedal pulses palpable and strong bilaterally.. chronic venous insufficiency with hemosiderin deposition. No major edema. Gastrointestinal (GI) Abdomen is soft and non-distended without masses or tenderness. Bowel sounds active in all quadrants.. No liver or spleen enlargement or tenderness.. Lymphatic none palpable in the popliteal or inguinal area. Psychiatric No evidence of depression, anxiety, or agitation. Calm, cooperative, and communicative. Appropriate interactions and affect.. Notes Wound exam. Covered in necrotic epitherlium and non  viable tissue. Removed with a piccup and scalpel and then a #3 curette. Hemostasis with direct pressure No infection is obvious Electronic Signature(s) Signed: 12/20/2016 3:44:58 PM By: Linton Ham MD Entered By: Linton Ham on 12/20/2016 12:41:17 Christy Cisneros ([id number]) -------------------------------------------------------------------------------- Physician Orders Details Christy Cisneros Date of Service: 12/20/2016 10:45 AM Patient Name: Christy Cisneros. Patient Account Number: [id number] Medical Record Treating RN: Ahmed Prima [id number] Number: Other Clinician: Date of Birth/Sex: 02-25-1920 (83 y.o. Female) Treating Florence Antonelli Primary Care Provider: Einar Pheasant Provider/Extender: G Referring Provider: Rondel Baton in Treatment: 0 Verbal / Phone Orders: Yes Clinician: Pinkerton, Debi Read Back and Verified: Yes Diagnosis Coding Wound Cleansing Wound #2 Right,Lateral Lower Leg o Clean wound with Normal Saline. o Cleanse wound with mild soap and water Anesthetic Wound #2 Right,Lateral Lower Leg o Topical Lidocaine 4% cream applied to wound bed prior to debridement Primary Wound Dressing Wound #2 Right,Lateral Lower Leg o Hydrogel o Prisma Ag Secondary Dressing Wound #2 Right,Lateral Lower Leg o ABD pad o Non-adherent pad Dressing Change Frequency Wound #2 Right,Lateral Lower Leg o Change dressing every week Follow-up Appointments Wound #2 Right,Lateral Lower Leg o Return Appointment in 1 week. Edema Control Wound #2 Right,Lateral Lower Leg o Kerlix and Coban - Right Lower Extremity - unna to anchor o Elevate legs to the level of the heart and pump ankles as often as possible Additional Orders / Instructions ALYANA, KREITER. ([id number]) Wound #2 Right,Lateral Lower Leg o Increase protein intake. Electronic Signature(s) Signed: 12/20/2016 3:34:08 PM By: Alric Quan Signed: 12/20/2016 3:44:58 PM By: Linton Ham MD Entered By: Alric Quan on 12/20/2016 11:39:50 Christy Cisneros ([id number]) -------------------------------------------------------------------------------- Problem List Details Christy Cisneros Date of Service: 12/20/2016 10:45 AM Patient Name: Christy Cisneros. Patient Account Number: [id number] Medical Record Treating RN: Ahmed Prima [id number] Number: Other Clinician: Date of Birth/Sex: February 04, 1920 (83 y.o. Female) Treating Bernarda Erck Primary Care Provider: Einar Pheasant Provider/Extender: G Referring Provider: Rondel Baton in Treatment: 0 Active Problems ICD-10 Encounter Code Description Active Date Diagnosis S81.811D Laceration without foreign body, right lower leg, 12/20/2016 Yes subsequent encounter I87.323 Chronic venous hypertension (idiopathic) with 12/20/2016 Yes inflammation of bilateral lower extremity Inactive Problems Resolved Problems Electronic Signature(s) Signed: 12/20/2016 3:44:58 PM By: Linton Ham MD Entered By: Linton Ham on 12/20/2016 12:28:33 Christy Cisneros ([id number]) -------------------------------------------------------------------------------- Progress Note/History and Physical Details Christy Cisneros Date of Service: 12/20/2016 10:45 AM  Patient Name: Christy Cisneros. Patient Account Number: [id number] Medical Record Treating RN: Ahmed Prima [id number] Number: Other Clinician: Date of Birth/Sex: [birth year] (83 y.o. Female) Treating Iva Posten Primary Care Provider: Einar Pheasant Provider/Extender: G Referring Provider: Rondel Baton in Treatment: 0 Subjective Chief Complaint Information obtained from Patient Right calf traumatic ulcer since early Dec 2015. 12/20/16; Patient here for a review of a non healing traumatic wound on the right lateral leg History of Present Illness (HPI) Very pleasant 83 year old with no significant past medical history. No diabetes or peripheral  vascular disease. She traumatized her right anterior calf on a car door on 07/08/2014. She is slowly improving with regular debridements, compression, and. No significant pain. No claudication or rest pain. Right ABI 1.02. She walks a mile a day. No drainage. No fever or chills. She returns to clinic today and says that the ulceration has healed. 12/20/16; This is a 83 year old women who traumatized her right leg against a table while adjusting a shade in her home. She suffered a skin tear. She was seen 2 weeks ago in her primary doctor's office and had a non stick dressing placed with kerlix and coban and that has apparently remained in place since then. She is complaining of pain but no systemic symptoms. I note she has been in this clinic 3 years ago for a similar presentation on the right leg.She was not seen by any of the current providers in our practice. She has compression stockings at home but does not use them. She has venous insufficiency. ABI's in this clinic 1.09 Wound History Patient presents with 1 open wound that has been present for approximately 2 weeks. Patient has been treating wound in the following manner: wrapping it by nurse at doctors office. Laboratory tests have not been performed in the last month. Patient reportedly has not tested positive for an antibiotic resistant organism. Patient reportedly has not tested positive for osteomyelitis. Patient reportedly has not had testing performed to evaluate circulation in the legs. Patient experiences the following problems associated with their wounds: swelling. Patient History Information obtained from Patient. Allergies polymyxin B, bacitracin, codeine, Keflex, Alphagan P, clarithromycin, timolol maleate, Erythromycin Base, SHARONANN, MALBROUGH. ([id number]) Percocet, Prevacid, Vigamox, Avelox, cefdinir, cephalexin, lansoprazole Family History Cancer, Heart Disease - Mother, Hypertension - Mother, No family history of  Diabetes, Hereditary Spherocytosis, Kidney Disease, Lung Disease, Seizures, Stroke, Thyroid Problems, Tuberculosis. Social History Never smoker, Marital Status - Single, Alcohol Use - Daily, Drug Use - No History, Caffeine Use - Daily. Medical History Eyes Patient has history of Glaucoma Denies history of Cataracts, Optic Neuritis Ear/Nose/Mouth/Throat Denies history of Chronic sinus problems/congestion, Middle ear problems Hematologic/Lymphatic Denies history of Anemia, Hemophilia, Human Immunodeficiency Virus, Lymphedema, Sickle Cell Disease Respiratory Patient has history of Sleep Apnea - not using CPAP Denies history of Aspiration, Asthma, Chronic Obstructive Pulmonary Disease (COPD), Pneumothorax, Tuberculosis Cardiovascular Denies history of Angina, Arrhythmia, Congestive Heart Failure, Coronary Artery Disease, Deep Vein Thrombosis, Hypertension, Hypotension, Myocardial Infarction, Peripheral Arterial Disease, Peripheral Venous Disease, Phlebitis, Vasculitis Gastrointestinal Denies history of Cirrhosis , Colitis, Crohn s, Hepatitis A, Hepatitis B, Hepatitis C Endocrine Denies history of Type I Diabetes, Type II Diabetes Genitourinary Denies history of End Stage Renal Disease Immunological Denies history of Lupus Erythematosus, Raynaud s, Scleroderma Integumentary (Skin) Denies history of History of Burn, History of pressure wounds Musculoskeletal Patient has history of Osteoarthritis Denies history of Gout, Rheumatoid Arthritis, Osteomyelitis Neurologic Denies history of Dementia, Neuropathy, Quadriplegia, Paraplegia, Seizure Disorder Oncologic Denies history of  Received Chemotherapy, Received Radiation Psychiatric Denies history of Anorexia/bulimia, Confinement Anxiety Medical And Surgical History Notes Ear/Nose/Mouth/Throat bilateral hearing aids Gastrointestinal MANIYAH, MOLLER ([id number]) GERD Review of Systems (ROS) Constitutional Symptoms (General  Health) The patient has no complaints or symptoms. Eyes Complains or has symptoms of Glasses / Contacts. Ear/Nose/Mouth/Throat [facility] Hematologic/Lymphatic The patient has no complaints or symptoms. Respiratory chronic bronchitis Cardiovascular hypercholesterolemia TIA Gastrointestinal Barretts esophagus Genitourinary The patient has no complaints or symptoms. Immunological The patient has no complaints or symptoms. Integumentary (Skin) Complains or has symptoms of Wounds. Musculoskeletal osteoporosis Objective Constitutional Sitting or standing Blood Pressure is within target range for patient.. Pulse regular and within target range for patient.. Temperature is normal and within the target range for the patient.. Patient is allert and appears to be in no distress. Vitals Time Taken: 10:50 AM, Height: 62 in, Source: Stated, Weight: 102.9 lbs, Source: Measured, BMI: 18.8, Temperature: 97.9 F, Pulse: 80 bpm, Respiratory Rate: 18 breaths/min, Blood Pressure: 99/84 mmHg. Eyes Conjunctivae clear. No discharge.Marland Kitchen Respiratory kyphotic. Cardiovascular BRANIYAH, BESSE ([id number]) Femoral arteries without bruits and pulses strong.. Pedal pulses palpable and strong bilaterally.. chronic venous insufficiency with hemosiderin deposition. No major edema. Gastrointestinal (GI) Abdomen is soft and non-distended without masses or tenderness. Bowel sounds active in all quadrants.. No liver or spleen enlargement or tenderness.. Lymphatic none palpable in the popliteal or inguinal area. Psychiatric No evidence of depression, anxiety, or agitation. Calm, cooperative, and communicative. Appropriate interactions and affect.. General Notes: Wound exam. Covered in necrotic epitherlium and non viable tissue. Removed with a piccup and scalpel and then a #3 curette. Hemostasis with direct pressure No infection is obvious Integumentary (Hair, Skin) Wound #2 status is Open. Original cause of  wound was Trauma. The wound is located on the Right,Lateral Lower Leg. The wound measures 3cm length x 2cm width x 0.1cm depth; 4.712cm^2 area and 0.471cm^3 volume. There is no tunneling or undermining noted. There is a large amount of serosanguineous drainage noted. The wound margin is distinct with the outline attached to the wound base. There is small (1-33%) red granulation within the wound bed. There is a large (67-100%) amount of necrotic tissue within the wound bed including Eschar. The periwound skin appearance exhibited: Erythema. The surrounding wound skin color is noted with erythema which is circumferential. Periwound temperature was noted as No Abnormality. The periwound has tenderness on palpation. Assessment Active Problems ICD-10 S81.811D - Laceration without foreign body, right lower leg, subsequent encounter I87.323 - Chronic venous hypertension (idiopathic) with inflammation of bilateral lower extremity Procedures Wound #2 Pre-procedure diagnosis of Wound #2 is a Venous Leg Ulcer located on the Right,Lateral Lower Leg .Severity of Tissue Pre Debridement is: Fat layer exposed. There was a Skin/Subcutaneous Tissue Debridement ([id number]) debridement with total area of 6 sq cm performed by ERCILIA, BETTINGER. ([id number]) MD. with the following instrument(s): Blade, Curette, and Forceps to remove Viable and Non-Viable tissue/material including Exudate, Fibrin/Slough, and Subcutaneous after achieving pain control using Lidocaine 4% Topical Solution. A time out was conducted at 11:31, prior to the start of the procedure. A Minimum amount of bleeding was controlled with Pressure. The procedure was tolerated well with a pain level of 0 throughout and a pain level of 0 following the procedure. Post Debridement Measurements: 3cm length x 2cm width x 0.1cm depth; 0.471cm^3 volume. Character of Wound/Ulcer Post Debridement requires further debridement. Severity  of Tissue Post Debridement is: Fat layer exposed. Post procedure Diagnosis Wound #2: Same as Pre-Procedure Plan  Wound Cleansing: Wound #2 Right,Lateral Lower Leg: Clean wound with Normal Saline. Cleanse wound with mild soap and water Anesthetic: Wound #2 Right,Lateral Lower Leg: Topical Lidocaine 4% cream applied to wound bed prior to debridement Primary Wound Dressing: Wound #2 Right,Lateral Lower Leg: Hydrogel Prisma Ag Secondary Dressing: Wound #2 Right,Lateral Lower Leg: ABD pad Non-adherent pad Dressing Change Frequency: Wound #2 Right,Lateral Lower Leg: Change dressing every week Follow-up Appointments: Wound #2 Right,Lateral Lower Leg: Return Appointment in 1 week. Edema Control: Wound #2 Right,Lateral Lower Leg: Kerlix and Coban - Right Lower Extremity - unna to anchor Elevate legs to the level of the heart and pump ankles as often as possible Additional Orders / Instructions: Wound #2 Right,Lateral Lower Leg: Increase protein intake. TILIA, FASO. ([id number]) 1 collagen,hydrogen,ABD,kerlix and coban to remain in place all week 2 she was given doxy by primary care 2 weeks ago, I see no need for additional antibiotics. 3 she has compression stockings at home but states she cannot get them on over her foot. A good canddiate for juxtlites Electronic Signature(s) Signed: 12/20/2016 3:44:58 PM By: Linton Ham MD Entered By: Linton Ham on 12/20/2016 12:43:52 Christy Cisneros ([id number]) -------------------------------------------------------------------------------- ROS/PFSH Details Christy Cisneros Date of Service: 12/20/2016 10:45 AM Patient Name: Christy Cisneros. Patient Account Number: [id number] Medical Record Treating RN: Ahmed Prima [id number] Number: Other Clinician: Date of Birth/Sex: 03-18-1920 (83 y.o. Female) Treating Vania Rosero, [place] Primary Care Provider: Einar Pheasant Provider/Extender: G Referring Provider: Mable Paris Weeks in  Treatment: 0 Label Progress Note Print Version as History and Physical for this encounter Information Obtained From Patient Wound History Do you currently have one or more open woundso Yes How many open wounds do you currently haveo 1 Approximately how long have you had your woundso 2 weeks How have you been treating your wound(s) until nowo wrapping it by nurse at doctors office Has your wound(s) ever healed and then re-openedo No Have you had any lab work done in the past montho No Have you tested positive for an antibiotic resistant organism No (MRSA, VRE)o Have you tested positive for osteomyelitis (bone infection)o No Have you had any tests for circulation on your legso No Have you had other problems associated with your woundso Swelling Eyes Complaints and Symptoms: Positive for: Glasses / Contacts Medical History: Positive for: Glaucoma Negative for: Cataracts; Optic Neuritis Integumentary (Skin) Complaints and Symptoms: Positive for: Wounds Medical History: Negative for: History of Burn; History of pressure wounds Constitutional Symptoms (General Health) Complaints and Symptoms: No Complaints or Symptoms NINAH, MOCCIO ([id number]) Ear/Nose/Mouth/Throat Complaints and Symptoms: Review of System Notes: HOH Medical History: Negative for: Chronic sinus problems/congestion; Middle ear problems Past Medical History Notes: bilateral hearing aids Hematologic/Lymphatic Complaints and Symptoms: No Complaints or Symptoms Medical History: Negative for: Anemia; Hemophilia; Human Immunodeficiency Virus; Lymphedema; Sickle Cell Disease Respiratory Complaints and Symptoms: Review of System Notes: chronic bronchitis Medical History: Positive for: Sleep Apnea - not using CPAP Negative for: Aspiration; Asthma; Chronic Obstructive Pulmonary Disease (COPD); Pneumothorax; Tuberculosis Cardiovascular Complaints and Symptoms: Review of System  Notes: hypercholesterolemia TIA Medical History: Negative for: Angina; Arrhythmia; Congestive Heart Failure; Coronary Artery Disease; Deep Vein Thrombosis; Hypertension; Hypotension; Myocardial Infarction; Peripheral Arterial Disease; Peripheral Venous Disease; Phlebitis; Vasculitis Gastrointestinal Complaints and Symptoms: Review of System Notes: Barretts esophagus Medical History: Negative for: Cirrhosis ; Colitis; Crohnos; Hepatitis A; Hepatitis B; Hepatitis C Past Medical History Notes: GERD ALAYZA, PIEPER ([id number]) Endocrine Medical History: Negative for: Type I Diabetes; Type II Diabetes Genitourinary Complaints and Symptoms: No  Complaints or Symptoms Medical History: Negative for: End Stage Renal Disease Immunological Complaints and Symptoms: No Complaints or Symptoms Medical History: Negative for: Lupus Erythematosus; Raynaudos; Scleroderma Musculoskeletal Complaints and Symptoms: Review of System Notes: osteoporosis Medical History: Positive for: Osteoarthritis Negative for: Gout; Rheumatoid Arthritis; Osteomyelitis Neurologic Medical History: Negative for: Dementia; Neuropathy; Quadriplegia; Paraplegia; Seizure Disorder Oncologic Medical History: Negative for: Received Chemotherapy; Received Radiation Psychiatric Medical History: Negative for: Anorexia/bulimia; Confinement Anxiety HBO Extended History Items Eyes: Glaucoma Immunizations ANGI, GOODELL ([id number]) Pneumococcal Vaccine: Received Pneumococcal Vaccination: Yes Family and Social History Cancer: Yes; Diabetes: No; Heart Disease: Yes - Mother; Hereditary Spherocytosis: No; Hypertension: Yes - Mother; Kidney Disease: No; Lung Disease: No; Seizures: No; Stroke: No; Thyroid Problems: No; Tuberculosis: No; Never smoker; Marital Status - Single; Alcohol Use: Daily; Drug Use: No History; Caffeine Use: Daily; Financial Concerns: No; Food, Clothing or Shelter Needs: No; Support  System Lacking: No; Transportation Concerns: No; Advanced Directives: Yes (Not Provided); Patient does not want information on Advanced Directives; Living Will: Yes (Copy provided); Medical Power of Attorney: Yes - Freddrick March (Copy provided) Electronic Signature(s) Signed: 12/20/2016 3:34:08 PM By: Alric Quan Signed: 12/20/2016 3:44:58 PM By: Linton Ham MD Entered By: Alric Quan on 12/20/2016 11:21:43 Christy Cisneros ([id number]) -------------------------------------------------------------------------------- Tonye Pearson Date of Service: 12/20/2016 Patient Name: Christy Cisneros. Patient Account Number: [id number] Medical Record Treating RN: Ahmed Prima [id number] Number: Other Clinician: Date of Birth/Sex: October 31, 1919 (83 y.o. Female) Treating Cashlyn Huguley Primary Care Provider: Einar Pheasant Provider/Extender: G Referring Provider: Rondel Baton in Treatment: 0 Diagnosis Coding ICD-10 Codes Code Description S81.811D Laceration without foreign body, right lower leg, subsequent encounter I87.323 Chronic venous hypertension (idiopathic) with inflammation of bilateral lower extremity Facility Procedures CPT4 Code Description: [id number] - WOUND CARE VISIT-LEV 3 EST PT Modifier: Quantity: 1 CPT4 Code Description: [id number] 11042 - DEB SUBQ TISSUE 20 SQ CM/< ICD-10 Description Diagnosis S81.811D Laceration without foreign body, right lower leg, s Modifier: ubsequent enc Quantity: 1 ounter Physician Procedures CPT4: Description Modifier Quantity Code [id number] WC PHYS LEVEL 3 o NEW PT 25 1 ICD-10 Description Diagnosis S81.811D Laceration without foreign body, right lower leg, subsequent encounter I87.323 Chronic venous hypertension (idiopathic) with inflammation  of bilateral lower extremity CPT4: 3291916 11042 - WC PHYS SUBQ TISS 20 SQ CM 1 ICD-10 Description Diagnosis S81.811D Laceration without foreign body, right lower leg,  subsequent encounter Electronic Signature(s) Signed: 12/20/2016 3:34:08 PM By: Alric Quan Signed: 12/20/2016 3:44:58 PM By: Linton Ham MD Christy Cisneros ([id number]) Entered By: Alric Quan on 12/20/2016 12:49:21

## 2016-12-21 NOTE — Progress Notes (Signed)
ROZETTA, STUMPP ([id number]) Visit Report for 12/20/2016 Allergy List Details KALANA, YUST Date of Service: 12/20/2016 10:45 AM Patient Name: M. Patient Account Number: [id number] Medical Record Treating RN: Ahmed Prima [id number] Number: Other Clinician: Date of Birth/Sex: March 24, 1920 (83 y.o. Female) Treating ROBSON, MICHAEL Primary Care Luster Hechler: Einar Pheasant Nur Rabold/Extender: G Referring [place]: Mable Paris Weeks in Treatment: 0 Allergies Active Allergies polymyxin B bacitracin codeine Keflex Alphagan P clarithromycin timolol maleate Erythromycin Base Percocet Prevacid Vigamox Avelox cefdinir cephalexin lansoprazole KANDY, TOWERY ([id number]) Allergy Notes Electronic Signature(s) Signed: 12/20/2016 3:34:08 PM By: Alric Quan Entered By: Alric Quan on 12/20/2016 10:48:12 [name] ([id number]) -------------------------------------------------------------------------------- Arrival Information Details [name] Date of Service: 12/20/2016 10:45 AM Patient Name: [name] Patient Account Number: [id number] Medical Record Treating RN: Ahmed Prima [id number] Number: Other Clinician: Date of Birth/Sex: [birth year] (83 y.o. Female) Treating ROBSON, MICHAEL Primary Care Cledith Kamiya: Einar Pheasant Lynlee Stratton/Extender: G Referring Gabrielly Mccrystal: Rondel Baton in Treatment: 0 Visit Information Patient Arrived: Ambulatory Arrival Time: 10:51 Accompanied By: friend Transfer Assistance: None Patient Identification Verified: Yes Secondary Verification Process Yes Completed: Patient Requires Transmission-Based No Precautions: Patient Has Alerts: No History Since Last Visit All ordered tests and consults were completed: No Added or deleted any medications: No Any new allergies or adverse reactions: No Had a fall or experienced change in activities of daily living that may affect risk of falls: No Signs or  symptoms of abuse/neglect since last visito No Hospitalized since last visit: No Electronic Signature(s) Signed: 12/20/2016 3:34:08 PM By: Alric Quan Entered By: Alric Quan on 12/20/2016 10:52:10 [name] ([id number]) -------------------------------------------------------------------------------- Clinic Level of Care Assessment Details [name] Date of Service: 12/20/2016 10:45 AM Patient Name: M. Patient Account Number: [id number] Medical Record Treating RN: Ahmed Prima [id number] Number: Other Clinician: Date of Birth/Sex: 1919/08/07 (83 y.o. Female) Treating ROBSON, MICHAEL Primary Care Calin Fantroy: Einar Pheasant Maxi Rodas/Extender: G Referring Miciah Covelli: Rondel Baton in Treatment: 0 Clinic Level of Care Assessment Items TOOL 1 Quantity Score X - Use when EandM and Procedure is performed on INITIAL visit 1 0 ASSESSMENTS - Nursing Assessment / Reassessment X - General Physical Exam (combine w/ comprehensive assessment (listed just 1 20 below) when performed on new pt. evals) X - Comprehensive Assessment (HX, ROS, Risk Assessments, Wounds Hx, etc.) 1 25 ASSESSMENTS - Wound and Skin Assessment / Reassessment []  - Dermatologic / Skin Assessment (not related to wound area) 0 ASSESSMENTS - Ostomy and/or Continence Assessment and Care []  - Incontinence Assessment and Management 0 []  - Ostomy Care Assessment and Management (repouching, etc.) 0 PROCESS - Coordination of Care []  - Simple Patient / Family Education for ongoing care 0 X - Complex (extensive) Patient / Family Education for ongoing care 1 20 X - Staff obtains Programmer, systems, Records, Test Results / Process Orders 1 10 []  - Staff telephones HHA, Nursing Homes / Clarify orders / etc 0 []  - Routine Transfer to another Facility (non-emergent condition) 0 []  - Routine Hospital Admission (non-emergent condition) 0 X - New Admissions / Biomedical engineer / Ordering NPWT, Apligraf, etc. 1  15 []  - Emergency Hospital Admission (emergent condition) 0 PROCESS - Special Needs []  - Pediatric / Minor Patient Management 0 KYELLE, URBAS. ([id number]) []  - Isolation Patient Management 0 []  - Hearing / Language / Visual special needs 0 []  - Assessment of Community assistance (transportation, D/C planning, etc.) 0 []  - Additional assistance / Altered mentation 0 []  - Support Surface(s) Assessment (bed, cushion, seat, etc.) 0 INTERVENTIONS - Miscellaneous []  -  External ear exam 0 []  - Patient Transfer (multiple staff / Civil Service fast streamer / Similar devices) 0 []  - Simple Staple / Suture removal (25 or less) 0 []  - Complex Staple / Suture removal (26 or more) 0 []  - Hypo/Hyperglycemic Management (do not check if billed separately) 0 X - Ankle / Brachial Index (ABI) - do not check if billed separately 1 15 Has the patient been seen at the hospital within the last three years: Yes Total Score: 105 Level Of Care: New/Established - Level 3 Electronic Signature(s) Signed: 12/20/2016 3:34:08 PM By: Alric Quan Entered By: Alric Quan on 12/20/2016 12:49:12 [name] ([id number]) -------------------------------------------------------------------------------- Encounter Discharge Information Details [name] Date of Service: 12/20/2016 10:45 AM Patient Name: [name] Patient Account Number: [id number] Medical Record Treating RN: Ahmed Prima [id number] Number: Other Clinician: Date of Birth/Sex: 12-Mar-1920 (83 y.o. Female) Treating ROBSON, MICHAEL Primary Care Tyner Codner: Einar Pheasant Koree Staheli/Extender: G Referring Allsion N[place]: Rondel Baton in Treatment: 0 Encounter Discharge Information Items Discharge Pain Level: 0 Discharge Condition: Stable Ambulatory Status: Ambulatory Discharge Destination: Home Transportation: Private Auto Accompanied By: friend Schedule Follow-up Appointment: Yes Medication Reconciliation completed and provided to  Patient/Care No Malky Rudzinski: Provided on Clinical Summary of Care: 12/20/2016 Form Type Recipient Paper Patient FO Electronic Signature(s) Signed: 12/20/2016 11:52:04 AM By: Ruthine Dose Entered By: Ruthine Dose on 12/20/2016 11:52:03 [name] ([id number]) -------------------------------------------------------------------------------- Lower Extremity Assessment Details [name] Date of Service: 12/20/2016 10:45 AM Patient Name: [name] Patient Account Number: [id number] Medical Record Treating RN: Ahmed Prima [id number] Number: Other Clinician: Date of Birth/Sex: 14-Jul-1920 (83 y.o. Female) Treating ROBSON, [place] Primary Care Briannon Boggio: Einar Pheasant Brianne Maina/Extender: G Referring Lasheka Kempner: Mable Paris Weeks in Treatment: 0 Vascular Assessment Pulses: Dorsalis Pedis Palpable: [Right:Yes] Posterior Tibial Extremity colors, hair growth, and conditions: Extremity Color: [Right:Hyperpigmented] Hair Growth on Extremity: [Right:Yes] Temperature of Extremity: [Right:Warm] Capillary Refill: [Right:> 3 seconds] Blood Pressure: Brachial: [Right:128] Dorsalis Pedis: [Left:Dorsalis Pedis: 140] Ankle: Posterior Tibial: [Left:Posterior Tibial: 140] [Right:1.09] Toe Nail Assessment Left: Right: Thick: No Discolored: No Deformed: No Improper Length and Hygiene: Yes Electronic Signature(s) Signed: 12/20/2016 3:34:08 PM By: Alric Quan Entered By: Alric Quan on 12/20/2016 11:14:28 [name] ([id number]) -------------------------------------------------------------------------------- Multi Wound Chart Details [name] Date of Service: 12/20/2016 10:45 AM Patient Name: [name] Patient Account Number: [id number] Medical Record Treating RN: Ahmed Prima [id number] Number: Other Clinician: Date of Birth/Sex: 12/29/1919 (83 y.o. Female) Treating ROBSON, MICHAEL Primary Care Nilan Iddings: Einar Pheasant Saya Mccoll/Extender: G Referring  Krystine Pabst: Mable Paris Weeks in Treatment: 0 Vital Signs Height(in): 62 Pulse(bpm): 80 Weight(lbs): 102.9 Blood Pressure 99/84 (mmHg): Body Mass Index(BMI): 19 Temperature(F): 97.9 Respiratory Rate 18 (breaths/min): Photos: [2:No Photos] [N/A:N/A] Wound Location: [2:Right Lower Leg - Lateral] [N/A:N/A] Wounding Event: [2:Trauma] [N/A:N/A] Primary Etiology: [2:Venous Leg Ulcer] [N/A:N/A] Secondary Etiology: [2:Trauma, Other] [N/A:N/A] Comorbid History: [2:Glaucoma, Sleep Apnea, Osteoarthritis, Osteomyelitis] [N/A:N/A] Date Acquired: [2:12/06/2016] [N/A:N/A] Weeks of Treatment: [2:0] [N/A:N/A] Wound Status: [2:Open] [N/A:N/A] Measurements L x W x D 3x2x0.1 [N/A:N/A] (cm) Area (cm) : [2:4.712] [N/A:N/A] Volume (cm) : [2:0.471] [N/A:N/A] Classification: [2:Partial Thickness] [N/A:N/A] Exudate Amount: [2:Large] [N/A:N/A] Exudate Type: [2:Serosanguineous] [N/A:N/A] Exudate Color: [2:red, brown] [N/A:N/A] Wound Margin: [2:Distinct, outline attached] [N/A:N/A] Granulation Amount: [2:Small (1-33%)] [N/A:N/A] Granulation Quality: [2:Red] [N/A:N/A] Necrotic Amount: [2:Large (67-100%)] [N/A:N/A] Necrotic Tissue: [2:Eschar] [N/A:N/A] Epithelialization: [2:None] [N/A:N/A] Debridement: [2:Debridement ([id number]) 11:31] [N/A:N/A N/A] Pre-procedure Verification/Time Out Taken: Pain Control: Lidocaine 4% Topical N/A N/A Solution Tissue Debrided: Fibrin/Slough, Exudates, N/A N/A Subcutaneous Level: Skin/Subcutaneous N/A N/A Tissue Debridement Area (sq 6 N/A N/A  cm): Instrument: Blade, Curette, Forceps N/A N/A Bleeding: Minimum N/A N/A Hemostasis Achieved: Pressure N/A N/A Procedural Pain: 0 N/A N/A Post Procedural Pain: 0 N/A N/A Debridement Treatment Procedure was tolerated N/A N/A Response: well Post Debridement 3x2x0.1 N/A N/A Measurements L x W x D (cm) Post Debridement 0.471 N/A N/A Volume: (cm) Periwound Skin Texture: No Abnormalities Noted N/A  N/A Periwound Skin No Abnormalities Noted N/A N/A Moisture: Periwound Skin Color: Erythema: Yes N/A N/A Erythema Location: Circumferential N/A N/A Temperature: No Abnormality N/A N/A Tenderness on Yes N/A N/A Palpation: Wound Preparation: Ulcer Cleansing: N/A N/A Rinsed/Irrigated with Saline Topical Anesthetic Applied: Other: lidocaine 4% Procedures Performed: Debridement N/A N/A Treatment Notes Electronic Signature(s) Signed: 12/20/2016 3:44:58 PM By: Linton Ham MD Entered By: Linton Ham on 12/20/2016 12:28:41 [name] ([id number]) -------------------------------------------------------------------------------- Multi-Disciplinary Care Plan Details [name] Date of Service: 12/20/2016 10:45 AM Patient Name: [name] Patient Account Number: [id number] Medical Record Treating RN: Ahmed Prima [id number] Number: Other Clinician: Date of Birth/Sex: 1919/12/11 (83 y.o. Female) Treating ROBSON, MICHAEL Primary Care Yolani Vo: Einar Pheasant Lakendria Nicastro/Extender: G Referring Thelbert Gartin: Rondel Baton in Treatment: 0 Active Inactive ` Abuse / Safety / Falls / Self Care Management Nursing Diagnoses: Potential for falls Goals: Patient will not experience any injury related to falls Date Initiated: 12/20/2016 Target Resolution Date: 03/04/2017 Goal Status: Active Interventions: Assess fall risk on admission and as needed Notes: ` Nutrition Nursing Diagnoses: Imbalanced nutrition Potential for alteratiion in Nutrition/Potential for imbalanced nutrition Goals: Patient/caregiver agrees to and verbalizes understanding of need to use nutritional supplements and/or vitamins as prescribed Date Initiated: 12/20/2016 Target Resolution Date: 04/08/2017 Goal Status: Active Interventions: Assess patient nutrition upon admission and as needed per policy Notes: ` Orientation to the [place]. ([id number]) Nursing  Diagnoses: Knowledge deficit related to the wound healing center program Goals: Patient/caregiver will verbalize understanding of the [place] Program Date Initiated: 12/20/2016 Target Resolution Date: 01/07/2017 Goal Status: Active Interventions: Provide education on orientation to the wound center Notes: ` Pain, Acute or Chronic Nursing Diagnoses: Pain, acute or chronic: actual or potential Potential alteration in comfort, pain Goals: Patient/caregiver will verbalize adequate pain control between visits Date Initiated: 12/20/2016 Target Resolution Date: 04/08/2017 Goal Status: Active Interventions: Complete pain assessment as per visit requirements Notes: ` Wound/Skin Impairment Nursing Diagnoses: Impaired tissue integrity Knowledge deficit related to ulceration/compromised skin integrity Goals: Ulcer/skin breakdown will have a volume reduction of 80% by week 12 Date Initiated: 12/20/2016 Target Resolution Date: 04/01/2017 Goal Status: Active Interventions: Assess patient/caregiver ability to perform ulcer/skin care regimen upon admission and as needed Notes: AVE, [place] ([id number]) Electronic Signature(s) Signed: 12/20/2016 3:34:08 PM By: Alric Quan Entered By: Alric Quan on 12/20/2016 11:23:01 [name] ([id number]) -------------------------------------------------------------------------------- Pain Assessment Details [name] Date of Service: 12/20/2016 10:45 AM Patient Name: [name] Patient Account Number: [id number] Medical Record Treating RN: Ahmed Prima [id number] Number: Other Clinician: Date of Birth/Sex: 10-11-1919 (83 y.o. Female) Treating ROBSON, MICHAEL Primary Care Cameka Rae: Einar Pheasant Rhya Shan/Extender: G Referring Nathanal Hermiz: Rondel Baton in Treatment: 0 Active Problems Location of Pain Severity and Description of Pain Patient Has Paino No Site Locations With Dressing Change: No Pain  Management and Medication Current Pain Management: Electronic Signature(s) Signed: 12/20/2016 3:34:08 PM By: Alric Quan Entered By: Alric Quan on 12/20/2016 10:51:14 [name] ([id number]) -------------------------------------------------------------------------------- Patient/Caregiver Education Details [name] Date of Service: 12/20/2016 10:45 AM Patient Name: [name] Patient Account Number: [id number] Medical Record Treating RN: Ahmed Prima [id number] Number:  Other Clinician: Date of Birth/Gender: 09-25-1919 (83 y.o. Female) Treating ROBSON, MICHAEL Primary Care Physician/Extender: Myrtie Cruise Physician: Suella [place] in Treatment: 0 Referring Physician: Mable Paris Education Assessment Education Provided To: Patient Education Topics Provided Welcome To The [place]: Handouts: Welcome To [facility] [place] Methods: Explain/Verbal Responses: State content correctly Wound/Skin Impairment: Handouts: Other: change dressing as ordered Methods: Demonstration, Explain/Verbal Responses: State content correctly Electronic Signature(s) Signed: 12/20/2016 3:34:08 PM By: Alric Quan Entered By: Alric Quan on 12/20/2016 11:23:55 [name] ([id number]) -------------------------------------------------------------------------------- Wound Assessment Details [name] Date of Service: 12/20/2016 10:45 AM Patient Name: [name] Patient Account Number: [id number] Medical Record Treating RN: Ahmed Prima [id number] Number: Other Clinician: Date of Birth/Sex: 1919/08/24 (83 y.o. Female) Treating ROBSON, Media Primary Care Jenilee Franey: Einar Pheasant Meko Masterson/Extender: G Referring Jaydian Santana: Mable Paris Weeks in Treatment: 0 Wound Status Wound Number: 2 Primary Venous Leg Ulcer Etiology: Wound Location: Right Lower Leg - Lateral Secondary Trauma, Other Wounding Event: Trauma Etiology: Date Acquired:  12/06/2016 Wound Status: Open Weeks Of Treatment: 0 Comorbid Glaucoma, Sleep Apnea, Clustered Wound: No History: Osteoarthritis, Osteomyelitis Photos Photo Uploaded By: Alric Quan on 12/20/2016 13:19:26 Wound Measurements Length: (cm) 3 Width: (cm) 2 Depth: (cm) 0.1 Area: (cm) 4.712 Volume: (cm) 0.471 % Reduction in Area: % Reduction in Volume: Epithelialization: None Tunneling: No Undermining: No Wound Description Classification: Partial Thickness Foul Odor Aft Wound Margin: Distinct, outline attached Slough/Fibrin Exudate Amount: Large Exudate Type: Serosanguineous Exudate Color: red, brown er Cleansing: No o No Wound Bed Granulation Amount: Small (1-33%) Granulation Quality: Red JANKI, DIKE ([id number]) Necrotic Amount: Large (67-100%) Necrotic Quality: Eschar Periwound Skin Texture Texture Color No Abnormalities Noted: No No Abnormalities Noted: No Erythema: Yes Moisture Erythema Location: Circumferential No Abnormalities Noted: No Temperature / Pain Temperature: No Abnormality Tenderness on Palpation: Yes Wound Preparation Ulcer Cleansing: Rinsed/Irrigated with Saline Topical Anesthetic Applied: Other: lidocaine 4%, Electronic Signature(s) Signed: 12/20/2016 3:34:08 PM By: Alric Quan Entered By: Alric Quan on 12/20/2016 11:13:22 RAIZEL, WESOLOWSKI ([id number]) -------------------------------------------------------------------------------- Vitals Details [name] Date of Service: 12/20/2016 10:45 AM Patient Name: [name] Patient Account Number: [id number] Medical Record Treating RN: Ahmed Prima [id number] Number: Other Clinician: Date of Birth/Sex: May 31, 1920 (83 y.o. Female) Treating ROBSON, [place] Primary Care Zoraya Fiorenza: Einar Pheasant Itati Brocksmith/Extender: G Referring Shirely Toren: Mable Paris Weeks in Treatment: 0 Vital Signs Time Taken: 10:50 Temperature (F): 97.9 Height (in): 62 Pulse (bpm): 80 Source:  Stated Respiratory Rate (breaths/min): 18 Weight (lbs): 102.9 Blood Pressure (mmHg): 99/84 Source: Measured Reference Range: 80 - 120 mg / dl Body Mass Index (BMI): 18.8 Electronic Signature(s) Signed: 12/20/2016 3:34:08 PM By: Alric Quan Entered By: Alric Quan on 12/20/2016 10:53:50

## 2016-12-21 NOTE — Progress Notes (Signed)
Christy Cisneros, Christy Cisneros ([id number]) Visit Report for 12/20/2016 Abuse/Suicide Risk Screen Details Christy Cisneros Date of Service: 12/20/2016 10:45 AM Patient Name: M. Patient Account Number: [id number] Medical Record Treating RN: Christy Cisneros [id number] Number: Other Clinician: Date of Birth/Sex: 10/27/1919 (83 y.o. Female) Treating Christy Cisneros Primary Care Christy Cisneros: Christy Cisneros Safi Cisneros/Extender: G Referring Christy Cisneros: Christy Cisneros Weeks in Treatment: 0 Abuse/Suicide Risk Screen Items Answer ABUSE/SUICIDE RISK SCREEN: Has anyone close to you tried to hurt or harm you recentlyo No Do you feel uncomfortable with anyone in your familyo No Has anyone forced you do things that you didnot want to doo No Do you have any thoughts of harming yourselfo No Patient displays signs or symptoms of abuse and/or neglect. No Electronic Signature(s) Signed: 12/20/2016 3:34:08 PM By: Alric Quan Entered By: Alric Quan on 12/20/2016 10:59:53 Christy Cisneros ([id number]) -------------------------------------------------------------------------------- Activities of Daily Living Details Christy Cisneros Date of Service: 12/20/2016 10:45 AM Patient Name: M. Patient Account Number: [id number] Medical Record Treating RN: Christy Cisneros [id number] Number: Other Clinician: Date of Birth/Sex: [birth year] (83 y.o. Female) Treating Christy Cisneros Primary Care Christy Cisneros: Christy Cisneros Christy Cisneros/Extender: G Referring Christy Cisneros: Christy Cisneros in Treatment: 0 Activities of Daily Living Items Answer Activities of Daily Living (Please select one for each item) Drive Automobile Not Able Take Medications Completely Able Use Telephone Completely Able Care for Appearance Completely Able Use Toilet Completely Able Bath / Shower Completely Able Dress Self Completely Able Feed Self Completely Able Walk Completely Able Get In / Out Bed Completely Able Housework Completely  Able Prepare Meals Completely [place] for Self Need Assistance Electronic Signature(s) Signed: 12/20/2016 3:34:08 PM By: Alric Quan Entered By: Alric Quan on 12/20/2016 11:00:44 Christy Cisneros ([id number]) -------------------------------------------------------------------------------- Education Assessment Details Christy Cisneros Date of Service: 12/20/2016 10:45 AM Patient Name: Christy Mages. Patient Account Number: [id number] Medical Record Treating RN: Christy Cisneros [id number] Number: Other Clinician: Date of Birth/Sex: [birth year] (83 y.o. Female) Treating Christy Cisneros Primary Care Vanya Carberry: Christy Cisneros Bitania Shankland/Extender: G Referring Christy Cisneros: Christy Cisneros in Treatment: 0 Primary Learner Assessed: Patient Learning Preferences/Education Level/Primary Language Learning Preference: Explanation, Printed Material Highest Education Level: High School Preferred Language: English Cognitive Barrier Assessment/Beliefs Language Barrier: No Translator Needed: No Memory Deficit: No Emotional Barrier: No Cultural/Religious Beliefs Affecting Medical No Care: Physical Barrier Assessment Impaired Vision: Yes Glasses Impaired Hearing: Yes HOH Decreased Hand dexterity: No Knowledge/Comprehension Assessment Knowledge Level: Medium Comprehension Level: Medium Ability to understand written Medium instructions: Ability to understand verbal Medium instructions: Motivation Assessment Anxiety Level: Calm Cooperation: Cooperative Education Importance: Acknowledges Need Interest in Health Problems: Asks Questions Perception: Coherent Willingness to Engage in Self- Medium Management Activities: Readiness to Engage in Self- Medium Management Activities: Christy Cisneros, Christy Cisneros ([id number]) Electronic Signature(s) Signed: 12/20/2016 3:34:08 PM By: Alric Quan Entered By: Alric Quan on 12/20/2016 11:01:14 Christy Cisneros, Christy Cisneros ([id number]) -------------------------------------------------------------------------------- Fall Risk Assessment Details Christy Cisneros Date of Service: 12/20/2016 10:45 AM Patient Name: Christy Mages. Patient Account Number: [id number] Medical Record Treating RN: Christy Cisneros [id number] Number: Other Clinician: Date of Birth/Sex: 01-12-1920 (83 y.o. Female) Treating Christy Cisneros Primary Care Christy Cisneros: Christy Cisneros Christy Cisneros/Extender: G Referring Christy Cisneros: Christy Cisneros in Treatment: 0 Fall Risk Assessment Items Have you had 2 or more falls in the last 12 monthso 0 No Have you had any fall that resulted in injury in the last 12 monthso 0 No FALL RISK ASSESSMENT: History of falling - immediate or within 3 months 0 No Secondary diagnosis 15 Yes Ambulatory aid None/bed rest/wheelchair/nurse  0 No Crutches/cane/walker 0 No Furniture 0 No IV Access/Saline Lock 0 No Gait/Training Normal/bed rest/immobile 0 No Weak 0 No Impaired 0 No Mental Status Oriented to own ability 0 Yes Electronic Signature(s) Signed: 12/20/2016 3:34:08 PM By: Alric Quan Entered By: Alric Quan on 12/20/2016 11:01:54 Christy Cisneros ([id number]) -------------------------------------------------------------------------------- Foot Assessment Details Christy Cisneros Date of Service: 12/20/2016 10:45 AM Patient Name: Christy Mages. Patient Account Number: [id number] Medical Record Treating RN: Christy Cisneros [id number] Number: Other Clinician: Date of Birth/Sex: 12/03/1919 (83 y.o. Female) Treating Christy Cisneros Primary Care Christy Cisneros: Christy Cisneros Christy Cisneros/Extender: G Referring Christy Cisneros: Christy Cisneros Weeks in Treatment: 0 Foot Assessment Items Site Locations + = Sensation present, - = Sensation absent, C = Callus, U = Ulcer R = Redness, W = Warmth, M = Maceration, PU = Pre-ulcerative lesion F = Fissure, S = Swelling, D = Dryness Assessment Right: Left: Other Deformity: No No Prior  Foot Ulcer: No No Prior Amputation: No No Charcot Joint: No No Ambulatory Status: Ambulatory Without Help Gait: Steady Electronic Signature(s) Signed: 12/20/2016 3:34:08 PM By: Alric Quan Entered By: Alric Quan on 12/20/2016 11:03:31 Christy Cisneros ([id number]) -------------------------------------------------------------------------------- Nutrition Risk Assessment Details Christy Cisneros Date of Service: 12/20/2016 10:45 AM Patient Name: Christy Mages. Patient Account Number: [id number] Medical Record Treating RN: Christy Cisneros [id number] Number: Other Clinician: Date of Birth/Sex: Mar 21, 1920 (83 y.o. Female) Treating Christy Cisneros Primary Care Telisa Ohlsen: Christy Cisneros Reshunda Strider/Extender: G Referring Olubunmi Rothenberger: Christy Cisneros Weeks in Treatment: 0 Height (in): 62 Weight (lbs): 102.9 Body Mass Index (BMI): 18.8 Nutrition Risk Assessment Items NUTRITION RISK SCREEN: I have an illness or condition that made me change the kind and/or 2 Yes amount of food I eat I eat fewer than two meals per day 3 Yes I eat few fruits and vegetables, or milk products 0 No I have three or more drinks of beer, liquor or wine almost every day 0 No I have tooth or mouth problems that make it hard for me to eat 0 No I don't always have enough money to buy the food I need 0 No I eat alone most of the time 1 Yes I take three or more different prescribed or over-the-counter drugs a 1 Yes day Without wanting to, I have lost or gained 10 pounds in the last six 0 No months I am not always physically able to shop, cook and/or feed myself 0 No Nutrition Protocols Good Risk Protocol Moderate Risk Protocol Electronic Signature(s) Signed: 12/20/2016 3:34:08 PM By: Alric Quan Entered By: Alric Quan on 12/20/2016 11:02:31

## 2016-12-27 ENCOUNTER — Encounter: Payer: Medicare Other | Admitting: Internal Medicine

## 2016-12-27 DIAGNOSIS — I87323 Chronic venous hypertension (idiopathic) with inflammation of bilateral lower extremity: Secondary | ICD-10-CM | POA: Diagnosis not present

## 2016-12-27 DIAGNOSIS — H409 Unspecified glaucoma: Secondary | ICD-10-CM | POA: Diagnosis not present

## 2016-12-27 DIAGNOSIS — S81811D Laceration without foreign body, right lower leg, subsequent encounter: Secondary | ICD-10-CM | POA: Diagnosis not present

## 2016-12-27 DIAGNOSIS — Z888 Allergy status to other drugs, medicaments and biological substances status: Secondary | ICD-10-CM | POA: Diagnosis not present

## 2016-12-27 DIAGNOSIS — S81811A Laceration without foreign body, right lower leg, initial encounter: Secondary | ICD-10-CM | POA: Diagnosis not present

## 2016-12-27 DIAGNOSIS — Z885 Allergy status to narcotic agent status: Secondary | ICD-10-CM | POA: Diagnosis not present

## 2016-12-27 DIAGNOSIS — Z881 Allergy status to other antibiotic agents status: Secondary | ICD-10-CM | POA: Diagnosis not present

## 2016-12-28 NOTE — Progress Notes (Signed)
Christy, Cisneros ([id number]) Visit Report for 12/27/2016 Chief Complaint Document Details Christy, Cisneros Date of Service: 12/27/2016 3:30 PM Patient Name: M. Patient Account Number: [id number] Medical Record Treating RN: Ahmed Prima [id number] Number: Other Clinician: Date of Birth/Sex: [birth year] (83 y.o. 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 12/20/16; Patient here for a review of a non healing traumatic wound on the right lateral leg Electronic Signature(s) Signed: 12/27/2016 5:12:21 PM By: Linton Ham MD Entered By: Linton Ham on 12/27/2016 17:00:00 Christy Cisneros ([id number]) -------------------------------------------------------------------------------- HPI Details Christy Cisneros Date of Service: 12/27/2016 3:30 PM Patient Name: Christy Cisneros. Patient Account Number: [id number] Medical Record Treating RN: Ahmed Prima [id number] Number: Other Clinician: Date of Birth/Sex: [birth year] (83 y.o. Female) Treating ROBSON, MICHAEL Primary Care Provider: Einar Pheasant Provider/Extender: G Referring Provider: Antony Salmon in Treatment: 1 History of Present Illness HPI Description: Very pleasant 83 year old with no significant past medical history. No diabetes or peripheral vascular disease. She traumatized her right anterior calf on a car door on 07/08/2014. She is slowly improving with regular debridements, compression, and. No significant pain. No claudication or rest pain. Right ABI 1.02. She walks a mile a day. No drainage. No fever or chills. She returns to clinic today and says that the ulceration has healed. 12/20/16; This is a 83 year old women who traumatized her right leg against a table while adjusting a shade in her home. She  suffered a skin tear. She was seen 2 weeks ago in her primary doctor's office and had a non stick dressing placed with kerlix and coban and that has apparently remained in place since then. She is complaining of pain but no systemic symptoms. I note she has been in this clinic 3 years ago for a similar presentation on the right leg.She was not seen by any of the current providers in our practice. She has compression stockings at home but does not use them. She has venous insufficiency. ABI's in this clinic 1.09 12/27/16; arrives today with the wound in better condition. Dimension smaller. We've been using [employer]) Signed: 12/27/2016 5:12:21 PM By: Linton Ham MD Entered By: Linton Ham on 12/27/2016 17:00:27 Christy Cisneros ([id number]) -------------------------------------------------------------------------------- Physical Exam Details Christy Cisneros Date of Service: 12/27/2016 3:30 PM Patient Name: Christy Cisneros. Patient Account Number: [id number] Medical Record Treating RN: Ahmed Prima [id number] Number: Other Clinician: Date of Birth/Sex: June 14, 1920 (83 y.o. Female) Treating ROBSON, MICHAEL Primary Care Provider: Einar Pheasant Provider/Extender: G Referring Provider: Antony Salmon in Treatment: 1 Constitutional Sitting or standing Blood Pressure is within target range for patient.. Pulse regular and within target range for patient.Marland Kitchen Respirations regular, non-labored and within target range.. Temperature is normal and within the target range for the patient.Marland Kitchen appears in no distress. Eyes Conjunctivae clear. No discharge. Respiratory Respiratory effort is easy and symmetric bilaterally. Rate is normal at rest and on room air.. Cardiovascular Pedal pulses palpable and strong bilaterally.. Chronic venous insufficiency with marked hemosiderin deposition. Lymphatic None palpable in the popliteal original area. Psychiatric No evidence of  depression, anxiety, or agitation. Calm, cooperative, and communicative. Appropriate interactions and affect.. Notes Wound exam; the wound was in better condition today. I did not debrided this today although further debridement may be necessary. There is no evidence of surrounding infection. Electronic Signature(s) Signed: 12/27/2016 5:12:21 PM By: Linton Ham MD Entered By: Linton Ham  on 12/27/2016 17:02:18 Christy Cisneros, Christy Cisneros ([id number]) -------------------------------------------------------------------------------- Physician Orders Details Christy Cisneros Date of Service: 12/27/2016 3:30 PM Patient Name: M. Patient Account Number: [id number] Medical Record Treating RN: Cornell Barman [id number] Number: Other Clinician: Date of Birth/Sex: [birth year] (83 y.o. Female) Treating ROBSON, MICHAEL Primary Care Provider: Einar Pheasant Provider/Extender: G Referring Provider: Antony Salmon in Treatment: 1 Verbal / Phone Orders: No Diagnosis Coding Wound Cleansing Wound #2 Right,Lateral Lower Leg o Clean wound with Normal Saline. o Cleanse wound with mild soap and water Anesthetic Wound #2 Right,Lateral Lower Leg o Topical Lidocaine 4% cream applied to wound bed prior to debridement Primary Wound Dressing Wound #2 Right,Lateral Lower Leg o Hydrogel o Prisma Ag Secondary Dressing Wound #2 Right,Lateral Lower Leg o ABD pad o Non-adherent pad Dressing Change Frequency Wound #2 Right,Lateral Lower Leg o Change dressing every week Follow-up Appointments Wound #2 Right,Lateral Lower Leg o Return Appointment in 1 week. Edema Control Wound #2 Right,Lateral Lower Leg o Kerlix and Coban - Right Lower Extremity - unna to anchor o Elevate legs to the level of the heart and pump ankles as often as possible Additional Orders / Instructions Christy Cisneros, Christy Cisneros. ([id number]) Wound #2 Right,Lateral Lower Leg o Increase protein intake. Electronic  Signature(s) Signed: 12/27/2016 5:11:39 PM By: Gretta Cool, BSN, RN, CWS, Kim RN, BSN Signed: 12/27/2016 5:12:21 PM By: Linton Ham MD Entered By: Gretta Cool, BSN, RN, CWS, Kim on 12/27/2016 16:01:57 Christy Cisneros, Christy Cisneros ([id number]) -------------------------------------------------------------------------------- Problem List Details Christy Cisneros Date of Service: 12/27/2016 3:30 PM Patient Name: Christy Cisneros. Patient Account Number: [id number] Medical Record Treating RN: Ahmed Prima [id number] Number: Other Clinician: Date of Birth/Sex: [birth year] (83 y.o. Female) Treating ROBSON, MICHAEL Primary Care Provider: Einar Pheasant Provider/Extender: G Referring Provider: Antony Salmon in Treatment: 1 Active Problems ICD-10 Encounter Code Description Active Date Diagnosis S81.811D Laceration without foreign body, right lower leg, 12/20/2016 Yes subsequent encounter I87.323 Chronic venous hypertension (idiopathic) with 12/20/2016 Yes inflammation of bilateral lower extremity Inactive Problems Resolved Problems Electronic Signature(s) Signed: 12/27/2016 5:12:21 PM By: Linton Ham MD Entered By: Linton Ham on 12/27/2016 16:59:42 Christy Cisneros ([id number]) -------------------------------------------------------------------------------- Progress Note Details Christy Cisneros Date of Service: 12/27/2016 3:30 PM Patient Name: Christy Cisneros. Patient Account Number: [id number] Medical Record Treating RN: Ahmed Prima [id number] Number: Other Clinician: Date of Birth/Sex: Apr 25, 1920 (83 y.o. 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 12/20/16; Patient here for a review of a non healing traumatic wound on the right lateral leg History of Present Illness (HPI) Very  pleasant 83 year old with no significant past medical history. No diabetes or peripheral vascular disease. She traumatized her right anterior calf on a car door on 07/08/2014. She is slowly improving with regular debridements, compression, and. No significant pain. No claudication or rest pain. Right ABI 1.02. She walks a mile a day. No drainage. No fever or chills. She returns to clinic today and says that the ulceration has healed. 12/20/16; This is a 83 year old women who traumatized her right leg against a table while adjusting a shade in her home. She suffered a skin tear. She was seen 2 weeks ago in her primary doctor's office and had a non stick dressing placed with kerlix and coban and that has apparently remained in place since then. She is complaining of pain but no systemic symptoms. I note she has been in this clinic  3 years ago for a similar presentation on the right leg.She was not seen by any of the current providers in our practice. She has compression stockings at home but does not use them. She has venous insufficiency. ABI's in this clinic 1.09 12/27/16; arrives today with the wound in better condition. Dimension smaller. We've been using Prisma Objective Constitutional Sitting or standing Blood Pressure is within target range for patient.. Pulse regular and within target range for patient.Marland Kitchen Respirations regular, non-labored and within target range.. Temperature is normal and within the target range for the patient.Marland Kitchen appears in no distress. Christy Cisneros, Christy Cisneros. ([id number]) Vitals Time Taken: 3:32 PM, Height: 62 in, Weight: 102.9 lbs, BMI: 18.8, Temperature: 97.9 F, Pulse: 72 bpm, Respiratory Rate: 16 breaths/min, Blood Pressure: 133/56 mmHg. Eyes Conjunctivae clear. No discharge. Respiratory Respiratory effort is easy and symmetric bilaterally. Rate is normal at rest and on room air.. Cardiovascular Pedal pulses palpable and strong bilaterally.. Chronic venous  insufficiency with marked hemosiderin deposition. Lymphatic None palpable in the popliteal original area. Psychiatric No evidence of depression, anxiety, or agitation. Calm, cooperative, and communicative. Appropriate interactions and affect.. General Notes: Wound exam; the wound was in better condition today. I did not debrided this today although further debridement may be necessary. There is no evidence of surrounding infection. Integumentary (Hair, Skin) Wound #2 status is Open. Original cause of wound was Trauma. The wound is located on the Right,Lateral Lower Leg. The wound measures 3.2cm length x 1.5cm width x 0.1cm depth; 3.77cm^2 area and 0.377cm^3 volume. There is Fat Layer (Subcutaneous Tissue) Exposed exposed. There is no tunneling or undermining noted. There is a large amount of serosanguineous drainage noted. The wound margin is distinct with the outline attached to the wound base. There is small (1-33%) red granulation within the wound bed. There is a medium (34-66%) amount of necrotic tissue within the wound bed including Adherent Slough. The periwound skin appearance exhibited: Ecchymosis. The periwound skin appearance did not exhibit: Callus, Crepitus, Excoriation, Induration, Rash, Scarring, Dry/Scaly, Maceration, Atrophie Blanche, Cyanosis, Hemosiderin Staining, Mottled, Pallor, Rubor, Erythema. Periwound temperature was noted as No Abnormality. The periwound has tenderness on palpation. Assessment Active Problems ICD-10 S81.811D - Laceration without foreign body, right lower leg, subsequent encounter I87.323 - Chronic venous hypertension (idiopathic) with inflammation of bilateral lower extremity Christy Cisneros, Christy Cisneros. ([id number]) Plan Wound Cleansing: Wound #2 Right,Lateral Lower Leg: Clean wound with Normal Saline. Cleanse wound with mild soap and water Anesthetic: Wound #2 Right,Lateral Lower Leg: Topical Lidocaine 4% cream applied to wound bed prior to  debridement Primary Wound Dressing: Wound #2 Right,Lateral Lower Leg: Hydrogel Prisma Ag Secondary Dressing: Wound #2 Right,Lateral Lower Leg: ABD pad Non-adherent pad Dressing Change Frequency: Wound #2 Right,Lateral Lower Leg: Change dressing every week Follow-up Appointments: Wound #2 Right,Lateral Lower Leg: Return Appointment in 1 week. Edema Control: Wound #2 Right,Lateral Lower Leg: Kerlix and Coban - Right Lower Extremity - unna to anchor Elevate legs to the level of the heart and pump ankles as often as possible Additional Orders / Instructions: Wound #2 Right,Lateral Lower Leg: Increase protein intake. #1 wound looks better this week, dimensions are improved. No debridement today. Continue Prisma based dressings. May need debridement next week Electronic Signature(s) Signed: 12/27/2016 5:12:21 PM By: Linton Ham MD Entered By: Linton Ham on 12/27/2016 17:02:59 Christy Cisneros ([id number]) -------------------------------------------------------------------------------- Christy Cisneros Date of Service: 12/27/2016 Patient Name: Christy Cisneros. Patient Account Number: [id number] Medical Record Treating RN: Ahmed Prima [id number] Number: Other Clinician: Date of Birth/Sex: 1920-02-24 (  83 y.o. Female) Treating ROBSON, MICHAEL Primary Care Provider: Einar Pheasant Provider/Extender: G Referring Provider: Antony Salmon in Treatment: 1 Diagnosis Coding ICD-10 Codes Code Description S81.811D Laceration without foreign body, right lower leg, subsequent encounter I87.323 Chronic venous hypertension (idiopathic) with inflammation of bilateral lower extremity Facility Procedures CPT4 Code: [id number] Description: [phone] - WOUND CARE VISIT-LEV 2 EST PT Modifier: Quantity: 1 Physician Procedures CPT4: Description Modifier Quantity Code [id number] - WC PHYS LEVEL 3 - EST PT 1 ICD-10 Description Diagnosis S81.811D Laceration without foreign  body, right lower leg, subsequent encounter I87.323 Chronic venous hypertension (idiopathic) with  inflammation of bilateral lower extremity Electronic Signature(s) Signed: 12/27/2016 5:12:21 PM By: Linton Ham MD Entered By: Linton Ham on 12/27/2016 17:03:15

## 2016-12-28 NOTE — Progress Notes (Signed)
Christy Cisneros, Christy Cisneros ([id number]) Visit Report for 12/27/2016 Arrival Information Details Christy Cisneros, Christy Cisneros Date of Service: 12/27/2016 3:30 PM Patient Name: M. Patient Account Number: [id number] Medical Record Treating RN: Cornell Barman [id number] Number: Other Clinician: Date of Birth/Sex: [birth year] (83 y.o. Female) Treating ROBSON, MICHAEL Primary Care Ottis Vacha: Einar Pheasant Raji Glinski/Extender: G Referring Jayston Trevino: Antony Salmon in Treatment: 1 Visit Information History Since Last Visit Added or deleted any medications: No Patient Arrived: Ambulatory Any new allergies or adverse reactions: No Arrival Time: 15:31 Had a fall or experienced change in No Accompanied By: friend, activities of daily living that may affect Arbie Cookey risk of falls: Transfer Assistance: None Signs or symptoms of abuse/neglect since last No Patient Identification Verified: Yes visito Secondary Verification Process Yes Hospitalized since last visit: No Completed: Has Dressing in Place as Prescribed: Yes Patient Requires Transmission-Based No Pain Present Now: No Precautions: Patient Has Alerts: No Electronic Signature(s) Signed: 12/27/2016 5:11:39 PM By: Gretta Cool, BSN, RN, CWS, Kim RN, BSN Entered By: Gretta Cool, BSN, RN, CWS, Kim on 12/27/2016 15:32:07 Christy Cisneros ([id number]) -------------------------------------------------------------------------------- Clinic Level of Care Assessment Details Christy Cisneros Date of Service: 12/27/2016 3:30 PM Patient Name: M. Patient Account Number: [id number] Medical Record Treating RN: Cornell Barman [id number] Number: Other Clinician: Date of Birth/Sex: 1920/05/01 (83 y.o. Female) Treating ROBSON, [place] Primary Care Ashraf Mesta: Einar Pheasant Yariana Hoaglund/Extender: G Referring Yovani Cogburn: Antony Salmon in Treatment: 1 Clinic Level of Care Assessment Items TOOL 4 Quantity Score []  - Use when only an EandM is performed on FOLLOW-UP visit  0 ASSESSMENTS - Nursing Assessment / Reassessment []  - Reassessment of Co-morbidities (includes updates in patient status) 0 X - Reassessment of Adherence to Treatment Plan 1 5 ASSESSMENTS - Wound and Skin Assessment / Reassessment X - Simple Wound Assessment / Reassessment - one wound 1 5 []  - Complex Wound Assessment / Reassessment - multiple wounds 0 []  - Dermatologic / Skin Assessment (not related to wound area) 0 ASSESSMENTS - Focused Assessment []  - Circumferential Edema Measurements - multi extremities 0 []  - Nutritional Assessment / Counseling / Intervention 0 []  - Lower Extremity Assessment (monofilament, tuning fork, pulses) 0 []  - Peripheral Arterial Disease Assessment (using hand held doppler) 0 ASSESSMENTS - Ostomy and/or Continence Assessment and Care []  - Incontinence Assessment and Management 0 []  - Ostomy Care Assessment and Management (repouching, etc.) 0 PROCESS - Coordination of Care X - Simple Patient / Family Education for ongoing care 1 15 []  - Complex (extensive) Patient / Family Education for ongoing care 0 []  - Staff obtains Programmer, systems, Records, Test Results / Process Orders 0 []  - Staff telephones HHA, Nursing Homes / Clarify orders / etc 0 Christy Cisneros, Christy Cisneros ([id number]) []  - Routine Transfer to another Facility (non-emergent condition) 0 []  - Routine Hospital Admission (non-emergent condition) 0 []  - New Admissions / Biomedical engineer / Ordering NPWT, Apligraf, etc. 0 []  - Emergency Hospital Admission (emergent condition) 0 X - Simple Discharge Coordination 1 10 []  - Complex (extensive) Discharge Coordination 0 PROCESS - Special Needs []  - Pediatric / Minor Patient Management 0 []  - Isolation Patient Management 0 []  - Hearing / Language / Visual special needs 0 []  - Assessment of Community assistance (transportation, D/C planning, etc.) 0 []  - Additional assistance / Altered mentation 0 []  - Support Surface(s) Assessment (bed, cushion, seat, etc.)  0 INTERVENTIONS - Wound Cleansing / Measurement X - Simple Wound Cleansing - one wound 1 5 []  - Complex Wound Cleansing - multiple wounds 0 X - Wound Imaging (photographs -  any number of wounds) 1 5 []  - Wound Tracing (instead of photographs) 0 X - Simple Wound Measurement - one wound 1 5 []  - Complex Wound Measurement - multiple wounds 0 INTERVENTIONS - Wound Dressings X - Small Wound Dressing one or multiple wounds 1 10 []  - Medium Wound Dressing one or multiple wounds 0 []  - Large Wound Dressing one or multiple wounds 0 []  - Application of Medications - topical 0 []  - Application of Medications - injection 0 Christy Cisneros, Christy Cisneros ([id number]) INTERVENTIONS - Miscellaneous []  - External ear exam 0 []  - Specimen Collection (cultures, biopsies, blood, body fluids, etc.) 0 []  - Specimen(s) / Culture(s) sent or taken to Lab for analysis 0 []  - Patient Transfer (multiple staff / Civil Service fast streamer / Similar devices) 0 []  - Simple Staple / Suture removal (25 or less) 0 []  - Complex Staple / Suture removal (26 or more) 0 []  - Hypo / Hyperglycemic Management (close monitor of Blood Glucose) 0 []  - Ankle / Brachial Index (ABI) - do not check if billed separately 0 X - Vital Signs 1 5 Has the patient been seen at the hospital within the last three years: Yes Total Score: 65 Level Of Care: New/Established - Level 2 Electronic Signature(s) Signed: 12/27/2016 5:11:39 PM By: Gretta Cool, BSN, RN, CWS, Kim RN, BSN Entered By: Gretta Cool, BSN, RN, CWS, Kim on 12/27/2016 16:55:02 Christy Cisneros ([id number]) -------------------------------------------------------------------------------- Encounter Discharge Information Details Christy Cisneros Date of Service: 12/27/2016 3:30 PM Patient Name: M. Patient Account Number: [id number] Medical Record Treating RN: Ahmed Prima [id number] Number: Other Clinician: Date of Birth/Sex: 1920/01/26 (83 y.o. Female) Treating ROBSON, MICHAEL Primary Care Zeus Marquis:  Einar Pheasant Sanskriti Greenlaw/Extender: G Referring Zamorah Ailes: Antony Salmon in Treatment: 1 Encounter Discharge Information Items Discharge Pain Level: 0 Discharge Condition: Stable Ambulatory Status: Ambulatory Discharge Destination: Home Transportation: Private Auto Accompanied By: friend Schedule Follow-up Appointment: Yes Medication Reconciliation completed and provided to Patient/Care Yes Carlyne Keehan: Provided on Clinical Summary of Care: 12/27/2016 Form Type Recipient Paper Patient FO Electronic Signature(s) Signed: 12/27/2016 5:11:39 PM By: Gretta Cool, BSN, RN, CWS, Kim RN, BSN Previous Signature: 12/27/2016 3:59:45 PM Version By: Ruthine Dose Entered By: Gretta Cool BSN, RN, CWS, Kim on 12/27/2016 16:56:22 Christy Cisneros ([id number]) -------------------------------------------------------------------------------- Lower Extremity Assessment Details Christy Cisneros Date of Service: 12/27/2016 3:30 PM Patient Name: Christy Mages. Patient Account Number: [id number] Medical Record Treating RN: Cornell Barman [id number] Number: Other Clinician: Date of Birth/Sex: [birth year] (83 y.o. Female) Treating ROBSON, MICHAEL Primary Care Juel Ripley: Einar Pheasant Jamya Starry/Extender: G Referring Sofia Jaquith: Antony Salmon in Treatment: 1 Vascular Assessment Pulses: Dorsalis Pedis Palpable: [Right:Yes] Posterior Tibial Extremity colors, hair growth, and conditions: Extremity Color: [Right:Mottled] Hair Growth on Extremity: [Right:Yes] Temperature of Extremity: [Right:Warm] Capillary Refill: [Right:< 3 seconds] Toe Nail Assessment Left: Right: Thick: No Discolored: No Deformed: No Improper Length and Hygiene: No Electronic Signature(s) Signed: 12/27/2016 5:11:39 PM By: Gretta Cool, BSN, RN, CWS, Kim RN, BSN Entered By: Gretta Cool, BSN, RN, CWS, Kim on 12/27/2016 15:41:30 Christy Cisneros ([id number]) -------------------------------------------------------------------------------- Multi Wound  Chart Details Christy Cisneros Date of Service: 12/27/2016 3:30 PM Patient Name: Christy Mages. Patient Account Number: [id number] Medical Record Treating RN: Cornell Barman [id number] Number: Other Clinician: Date of Birth/Sex: May 08, 1920 (83 y.o. Female) Treating ROBSON, MICHAEL Primary Care Stephane Niemann: Einar Pheasant Daylan Juhnke/Extender: G Referring Rosaisela Jamroz: Antony Salmon in Treatment: 1 Vital Signs Height(in): 62 Pulse(bpm): 72 Weight(lbs): 102.9 Blood Pressure 133/56 (mmHg): Body Mass Index(BMI): 19 Temperature(F): 97.9 Respiratory Rate 16 (breaths/min): Photos: [N/A:N/A] Wound Location: Right Lower Leg -  Lateral N/A N/A Wounding Event: Trauma N/A N/A Primary Etiology: Venous Leg Ulcer N/A N/A Secondary Etiology: Trauma, Other N/A N/A Comorbid History: Glaucoma, Sleep Apnea, N/A N/A Osteoarthritis Date Acquired: 12/06/2016 N/A N/A Weeks of Treatment: 1 N/A N/A Wound Status: Open N/A N/A Measurements L x W x D 3.2x1.5x0.1 N/A N/A (cm) Area (cm) : 3.77 N/A N/A Volume (cm) : 0.377 N/A N/A % Reduction in Area: 20.00% N/A N/A % Reduction in Volume: 20.00% N/A N/A Classification: Partial Thickness N/A N/A Exudate Amount: Large N/A N/A Exudate Type: Serosanguineous N/A N/A Exudate Color: red, brown N/A N/A Wound Margin: Distinct, outline attached N/A N/A Granulation Amount: Small (1-33%) N/A N/A Granulation Quality: Red, Hyper-granulation N/A N/A Christy Cisneros, Christy Cisneros ([id number]) Necrotic Amount: Medium (34-66%) N/A N/A Exposed Structures: Fat Layer (Subcutaneous N/A N/A Tissue) Exposed: Yes Fascia: No Tendon: No Muscle: No Joint: No Bone: No Epithelialization: None N/A N/A Periwound Skin Texture: Excoriation: No N/A N/A Induration: No Callus: No Crepitus: No Rash: No Scarring: No Periwound Skin Maceration: No N/A N/A Moisture: Dry/Scaly: No Periwound Skin Color: Ecchymosis: Yes N/A N/A Atrophie Blanche: No Cyanosis: No Erythema: No Hemosiderin Staining:  No Mottled: No Pallor: No Rubor: No Temperature: No Abnormality N/A N/A Tenderness on Yes N/A N/A Palpation: Wound Preparation: Ulcer Cleansing: N/A N/A Rinsed/Irrigated with Saline Topical Anesthetic Applied: Other: lidocaine 4% Treatment Notes Wound #2 (Right, Lateral Lower Leg) 1. Cleansed with: Clean wound with Normal Saline 2. Anesthetic Topical Lidocaine 4% cream to wound bed prior to debridement 4. Dressing Applied: Prisma Ag 5. Secondary Dressing Applied ABD Pad Notes Kerlix and Christy Cisneros, Christy Cisneros ([id number]) Electronic Signature(s) Signed: 12/27/2016 5:12:21 PM By: Linton Ham MD Entered By: Linton Ham on 12/27/2016 16:59:49 Christy Cisneros ([id number]) -------------------------------------------------------------------------------- Multi-Disciplinary Care Plan Details Christy Cisneros Date of Service: 12/27/2016 3:30 PM Patient Name: Christy Mages. Patient Account Number: [id number] Medical Record Treating RN: Cornell Barman [id number] Number: Other Clinician: Date of Birth/Sex: [birth year] (83 y.o. Female) Treating ROBSON, Irene Primary Care Vyom Brass: Einar Pheasant Aloma Boch/Extender: G Referring Juanice Warburton: Antony Salmon in Treatment: 1 Active Inactive ` Abuse / Safety / Falls / Self Care Management Nursing Diagnoses: Potential for falls Goals: Patient will not experience any injury related to falls Date Initiated: 12/20/2016 Target Resolution Date: 03/04/2017 Goal Status: Active Interventions: Assess fall risk on admission and as needed Notes: ` Nutrition Nursing Diagnoses: Imbalanced nutrition Potential for alteratiion in Nutrition/Potential for imbalanced nutrition Goals: Patient/caregiver agrees to and verbalizes understanding of need to use nutritional supplements and/or vitamins as prescribed Date Initiated: 12/20/2016 Target Resolution Date: 04/08/2017 Goal Status: Active Interventions: Assess patient nutrition upon  admission and as needed per policy Notes: ` Orientation to the [place], [place]. ([id number]) Nursing Diagnoses: Knowledge deficit related to the wound healing center program Goals: Patient/caregiver will verbalize understanding of the [facility] Program Date Initiated: 12/20/2016 Target Resolution Date: 01/07/2017 Goal Status: Active Interventions: Provide education on orientation to the wound center Notes: ` Pain, Acute or Chronic Nursing Diagnoses: Pain, acute or chronic: actual or potential Potential alteration in comfort, pain Goals: Patient/caregiver will verbalize adequate pain control between visits Date Initiated: 12/20/2016 Target Resolution Date: 04/08/2017 Goal Status: Active Interventions: Complete pain assessment as per visit requirements Notes: ` Wound/Skin Impairment Nursing Diagnoses: Impaired tissue integrity Knowledge deficit related to ulceration/compromised skin integrity Goals: Ulcer/skin breakdown will have a volume reduction of 80% by week 12 Date Initiated: 12/20/2016 Target Resolution Date: 04/01/2017 Goal Status: Active Interventions: Assess patient/caregiver ability to perform ulcer/skin  care regimen upon admission and as needed Notes: Christy Cisneros, Christy Cisneros ([id number]) Electronic Signature(s) Signed: 12/27/2016 5:11:39 PM By: Gretta Cool, BSN, RN, CWS, Kim RN, BSN Entered By: Gretta Cool, BSN, RN, CWS, Kim on 12/27/2016 15:44:35 Christy Cisneros ([id number]) -------------------------------------------------------------------------------- Pain Assessment Details Christy Cisneros Date of Service: 12/27/2016 3:30 PM Patient Name: M. Patient Account Number: [id number] Medical Record Treating RN: Cornell Barman [id number] Number: Other Clinician: Date of Birth/Sex: Mar 24, 1920 (83 y.o. Female) Treating ROBSON, MICHAEL Primary Care Banyan Goodchild: Einar Pheasant Alinah Sheard/Extender: G Referring Camdan Burdi: Antony Salmon in  Treatment: 1 Active Problems Location of Pain Severity and Description of Pain Patient Has Paino No Site Locations Pain Management and Medication Current Pain Management: Goals for Pain Management Topical or injectable lidocaine is offered to patient for acute pain when surgical debridement is performed. If needed, Patient is instructed to use over the counter pain medication for the following 24-48 hours after debridement. Wound care MDs do not prescribed pain medications. Patient has chronic pain or uncontrolled pain. Patient has been instructed to make an appointment with their Primary Care Physician for pain management. Electronic Signature(s) Signed: 12/27/2016 5:11:39 PM By: Gretta Cool, BSN, RN, CWS, Kim RN, BSN Entered By: Gretta Cool, BSN, RN, CWS, Kim on 12/27/2016 15:32:34 Christy Cisneros ([id number]) -------------------------------------------------------------------------------- Patient/Caregiver Education Details Christy Cisneros Date of Service: 12/27/2016 3:30 PM Patient Name: M. Patient Account Number: [id number] Medical Record Treating RN: Cornell Barman [id number] Number: Other Clinician: Date of Birth/Gender: 02-Dec-1919 (83 y.o. Female) Treating ROBSON, MICHAEL Primary Care Physician/Extender: Myrtie Cruise Physician: Suella [place] in Treatment: 1 Referring Physician: Einar Pheasant Education Assessment Education Provided To: Patient Education Topics Provided Wound/Skin Impairment: Handouts: Caring for Your Ulcer, Other: continue wound care as prescribed Methods: Demonstration Responses: State content correctly Electronic Signature(s) Signed: 12/27/2016 5:11:39 PM By: Gretta Cool, BSN, RN, CWS, Kim RN, BSN Entered By: Gretta Cool, BSN, RN, CWS, Kim on 12/27/2016 16:57:46 Christy Cisneros ([id number]) -------------------------------------------------------------------------------- Wound Assessment Details Christy Cisneros Date of Service: 12/27/2016 3:30 PM Patient Name: Christy Mages.  Patient Account Number: [id number] Medical Record Treating RN: Cornell Barman [id number] Number: Other Clinician: Date of Birth/Sex: Dec 05, 1919 (83 y.o. Female) Treating ROBSON, MICHAEL Primary Care Ilynn Stauffer: Einar Pheasant Viraaj Vorndran/Extender: G Referring Trachelle Low: Antony Salmon in Treatment: 1 Wound Status Wound Number: 2 Primary Etiology: Venous Leg Ulcer Wound Location: Right Lower Leg - Lateral Secondary Trauma, Other Etiology: Wounding Event: Trauma Wound Status: Open Date Acquired: 12/06/2016 Comorbid History: Glaucoma, Sleep Apnea, Weeks Of Treatment: 1 Osteoarthritis Clustered Wound: No Photos Wound Measurements Length: (cm) 3.2 Width: (cm) 1.5 Depth: (cm) 0.1 Area: (cm) 3.77 Volume: (cm) 0.377 % Reduction in Area: 20% % Reduction in Volume: 20% Epithelialization: None Tunneling: No Undermining: No Wound Description Classification: Partial Thickness Foul Odor Aft Wound Margin: Distinct, outline attached Slough/Fibrin Exudate Amount: Large Exudate Type: Serosanguineous Exudate Color: red, brown er Cleansing: No o No Wound Bed Granulation Amount: Small (1-33%) Exposed Structure Granulation Quality: Red, Hyper-granulation Fascia Exposed: No Necrotic Amount: Medium (34-66%) Fat Layer (Subcutaneous Tissue) Exposed: Yes Necrotic Quality: Adherent Slough Tendon Exposed: No Muscle Exposed: No Joint Exposed: No Christy Cisneros, Christy Cisneros ([id number]) Bone Exposed: No Periwound Skin Texture Texture Color No Abnormalities Noted: No No Abnormalities Noted: No Callus: No Atrophie Blanche: No Crepitus: No Cyanosis: No Excoriation: No Ecchymosis: Yes Induration: No Erythema: No Rash: No Hemosiderin Staining: No Scarring: No Mottled: No Pallor: No Moisture Rubor: No No Abnormalities Noted: No Dry / Scaly: No Temperature / Pain Maceration: No Temperature: No Abnormality Tenderness on Palpation: Yes Wound Preparation Ulcer Cleansing:  Rinsed/Irrigated  with Saline Topical Anesthetic Applied: Other: lidocaine 4%, Treatment Notes Wound #2 (Right, Lateral Lower Leg) 1. Cleansed with: Clean wound with Normal Saline 2. Anesthetic Topical Lidocaine 4% cream to wound bed prior to debridement 4. Dressing Applied: Prisma Ag 5. Secondary Dressing Applied ABD Pad Notes Kerlix and Event organiser) Signed: 12/27/2016 5:11:39 PM By: Gretta Cool, BSN, RN, CWS, Kim RN, BSN Entered By: Gretta Cool, BSN, RN, CWS, Kim on 12/27/2016 15:40:58 Christy Cisneros ([id number]) -------------------------------------------------------------------------------- Vitals Details Christy Cisneros Date of Service: 12/27/2016 3:30 PM Patient Name: Christy Mages. Patient Account Number: [id number] Medical Record Treating RN: Cornell Barman [id number] Number: Other Clinician: Date of Birth/Sex: October 26, 1919 (83 y.o. Female) Treating ROBSON, MICHAEL Primary Care Isaura Schiller: Einar Pheasant Halina Asano/Extender: G Referring Joselyne Spake: Antony Salmon in Treatment: 1 Vital Signs Time Taken: 15:32 Temperature (F): 97.9 Height (in): 62 Pulse (bpm): 72 Weight (lbs): 102.9 Respiratory Rate (breaths/min): 16 Body Mass Index (BMI): 18.8 Blood Pressure (mmHg): 133/56 Reference Range: 80 - 120 mg / dl Electronic Signature(s) Signed: 12/27/2016 5:11:39 PM By: Gretta Cool, BSN, RN, CWS, Kim RN, BSN Entered By: Gretta Cool, BSN, RN, CWS, Kim on 12/27/2016 15:32:53

## 2017-01-02 DIAGNOSIS — H5211 Myopia, right eye: Secondary | ICD-10-CM | POA: Diagnosis not present

## 2017-01-02 DIAGNOSIS — S0502XA Injury of conjunctiva and corneal abrasion without foreign body, left eye, initial encounter: Secondary | ICD-10-CM | POA: Diagnosis not present

## 2017-01-02 DIAGNOSIS — H35363 Drusen (degenerative) of macula, bilateral: Secondary | ICD-10-CM | POA: Diagnosis not present

## 2017-01-02 DIAGNOSIS — H353131 Nonexudative age-related macular degeneration, bilateral, early dry stage: Secondary | ICD-10-CM | POA: Diagnosis not present

## 2017-01-02 DIAGNOSIS — H1820 Unspecified corneal edema: Secondary | ICD-10-CM | POA: Diagnosis not present

## 2017-01-02 DIAGNOSIS — H401123 Primary open-angle glaucoma, left eye, severe stage: Secondary | ICD-10-CM | POA: Diagnosis not present

## 2017-01-02 DIAGNOSIS — H401113 Primary open-angle glaucoma, right eye, severe stage: Secondary | ICD-10-CM | POA: Diagnosis not present

## 2017-01-02 DIAGNOSIS — H524 Presbyopia: Secondary | ICD-10-CM | POA: Diagnosis not present

## 2017-01-02 DIAGNOSIS — H47233 Glaucomatous optic atrophy, bilateral: Secondary | ICD-10-CM | POA: Diagnosis not present

## 2017-01-02 DIAGNOSIS — H52221 Regular astigmatism, right eye: Secondary | ICD-10-CM | POA: Diagnosis not present

## 2017-01-02 DIAGNOSIS — H04123 Dry eye syndrome of bilateral lacrimal glands: Secondary | ICD-10-CM | POA: Diagnosis not present

## 2017-01-04 ENCOUNTER — Encounter: Payer: Medicare Other | Attending: Internal Medicine | Admitting: Internal Medicine

## 2017-01-04 DIAGNOSIS — I872 Venous insufficiency (chronic) (peripheral): Secondary | ICD-10-CM | POA: Diagnosis not present

## 2017-01-04 DIAGNOSIS — S81811D Laceration without foreign body, right lower leg, subsequent encounter: Secondary | ICD-10-CM | POA: Insufficient documentation

## 2017-01-04 DIAGNOSIS — I87323 Chronic venous hypertension (idiopathic) with inflammation of bilateral lower extremity: Secondary | ICD-10-CM | POA: Insufficient documentation

## 2017-01-04 DIAGNOSIS — S81811A Laceration without foreign body, right lower leg, initial encounter: Secondary | ICD-10-CM | POA: Diagnosis not present

## 2017-01-04 DIAGNOSIS — Z881 Allergy status to other antibiotic agents status: Secondary | ICD-10-CM | POA: Diagnosis not present

## 2017-01-04 DIAGNOSIS — Z885 Allergy status to narcotic agent status: Secondary | ICD-10-CM | POA: Insufficient documentation

## 2017-01-04 DIAGNOSIS — X58XXXD Exposure to other specified factors, subsequent encounter: Secondary | ICD-10-CM | POA: Insufficient documentation

## 2017-01-04 DIAGNOSIS — G4730 Sleep apnea, unspecified: Secondary | ICD-10-CM | POA: Insufficient documentation

## 2017-01-04 DIAGNOSIS — M1990 Unspecified osteoarthritis, unspecified site: Secondary | ICD-10-CM | POA: Diagnosis not present

## 2017-01-04 DIAGNOSIS — H409 Unspecified glaucoma: Secondary | ICD-10-CM | POA: Insufficient documentation

## 2017-01-04 DIAGNOSIS — Z888 Allergy status to other drugs, medicaments and biological substances status: Secondary | ICD-10-CM | POA: Diagnosis not present

## 2017-01-05 DIAGNOSIS — H6123 Impacted cerumen, bilateral: Secondary | ICD-10-CM | POA: Diagnosis not present

## 2017-01-05 DIAGNOSIS — H903 Sensorineural hearing loss, bilateral: Secondary | ICD-10-CM | POA: Diagnosis not present

## 2017-01-05 NOTE — Progress Notes (Signed)
Christy Cisneros ([id number]) Visit Report for 01/04/2017 Chief Complaint Document Details Christy Cisneros Date of Service: 01/04/2017 9:45 AM Patient Name: M. Patient Account Number: [id number] Medical Record Treating RN: Ahmed Prima [id number] Number: Other Clinician: Date of Birth/Sex: 05-11-1920 (83 y.o. Female) Treating Linton Ham Primary Care Provider: Einar Pheasant Provider/Extender: G Referring Provider: Antony Salmon in Treatment: 2 Information Obtained from: Patient Chief Complaint Right calf traumatic ulcer since early Dec 2015. 12/20/16; Patient here for a review of a non healing traumatic wound on the right lateral leg Electronic Signature(s) Signed: 01/04/2017 4:31:20 PM By: Linton Ham MD Entered By: Linton Ham on 01/04/2017 10:14:37 Christy Cisneros ([id number]) -------------------------------------------------------------------------------- HPI Details Christy Cisneros Date of Service: 01/04/2017 9:45 AM Patient Name: Christy Cisneros. Patient Account Number: [id number] Medical Record Treating RN: Ahmed Prima [id number] Number: Other Clinician: Date of Birth/Sex: 07-18-1920 (83 y.o. Female) Treating Jennell Janosik Primary Care Provider: Einar Pheasant Provider/Extender: G Referring Provider: Antony Salmon in Treatment: 2 History of Present Illness HPI Description: Very pleasant 83 year old with no significant past medical history. No diabetes or peripheral vascular disease. She traumatized her right anterior calf on a car door on 07/08/2014. She is slowly improving with regular debridements, compression, and. No significant pain. No claudication or rest pain. Right ABI 1.02. She walks a mile a day. No drainage. No fever or chills. She returns to clinic today and says that the ulceration has healed. 12/20/16; This is a 83 year old women who traumatized her right leg against a table while adjusting a shade in her home. She suffered a  skin tear. She was seen 2 weeks ago in her primary doctor's office and had a non stick dressing placed with kerlix and coban and that has apparently remained in place since then. She is complaining of pain but no systemic symptoms. I note she has been in this clinic 3 years ago for a similar presentation on the right leg.She was not seen by any of the current providers in our practice. She has compression stockings at home but does not use them. She has venous insufficiency. ABI's in this clinic 1.09 12/27/16; arrives today with the wound in better condition. Dimension smaller. We've been using Prisma 01/04/17; patient's wound on the right anterior calf looks a lot better this week dimension smaller base of the wound looks healthy. She has been using Prisma under 2 layer compression. She has chronic bilateral venous insufficiency and tells me she has stockings at home that she has not been wearing Electronic Signature(s) Signed: 01/04/2017 4:31:20 PM By: Linton Ham MD Entered By: Linton Ham on 01/04/2017 10:15:22 Christy Cisneros ([id number]) -------------------------------------------------------------------------------- Physical Exam Details Christy Cisneros Date of Service: 01/04/2017 9:45 AM Patient Name: Christy Cisneros. Patient Account Number: [id number] Medical Record Treating RN: Ahmed Prima [id number] Number: Other Clinician: Date of Birth/Sex: [birth year] (83 y.o. Female) Treating [name] Primary Care Provider: Einar Pheasant Provider/Extender: G Referring Provider: Antony Salmon in Treatment: 2 Constitutional Sitting or standing Blood Pressure is within target range for patient.. Pulse regular and within target range for patient.Marland Kitchen Respirations regular, non-labored and within target range.. Temperature is normal and within the target range for the patient.Marland Kitchen appears in no distress. Eyes Conjunctivae clear. No discharge. Respiratory Respiratory effort is easy  and symmetric bilaterally. Rate is normal at rest and on room air.. Cardiovascular Pedal pulses palpable and strong bilaterally.. Changes of chronic venous insufficiency but minimal edema. Lymphatic Nonpalpable in the popliteal or inguinal area. Psychiatric No evidence of depression, anxiety, or  agitation. Calm, cooperative, and communicative. Appropriate interactions and affect.. Notes Wound exam; continues to have her wound or in a better condition. No debridement is required and although I thought this might need further requirement for debridement I am doubtful today that we'll be chew. There is no evidence of surrounding infection. No arterial issues. She has chronic venous disease however she has minimal edema. Electronic Signature(s) Signed: 01/04/2017 4:31:20 PM By: Linton Ham MD Entered By: Linton Ham on 01/04/2017 10:22:00 Christy Cisneros ([id number]) -------------------------------------------------------------------------------- Physician Orders Details Christy Cisneros Date of Service: 01/04/2017 9:45 AM Patient Name: Christy Cisneros. Patient Account Number: [id number] Medical Record Treating RN: Ahmed Prima [id number] Number: Other Clinician: Date of Birth/Sex: 1919/08/02 (83 y.o. Female) Treating Idamae Coccia Primary Care Provider: Einar Pheasant Provider/Extender: G Referring Provider: Antony Salmon in Treatment: 2 Verbal / Phone Orders: Yes Clinician: Pinkerton, Debi Read Back and Verified: Yes Diagnosis Coding Wound Cleansing Wound #2 Right,Lateral Lower Leg o Clean wound with Normal Saline. o Cleanse wound with mild soap and water Anesthetic Wound #2 Right,Lateral Lower Leg o Topical Lidocaine 4% cream applied to wound bed prior to debridement Primary Wound Dressing Wound #2 Right,Lateral Lower Leg o Hydrogel o Prisma Ag Secondary Dressing Wound #2 Right,Lateral Lower Leg o ABD pad o Non-adherent pad Dressing Change  Frequency Wound #2 Right,Lateral Lower Leg o Change dressing every week Follow-up Appointments Wound #2 Right,Lateral Lower Leg o Return Appointment in 1 week. Edema Control Wound #2 Right,Lateral Lower Leg o Kerlix and Coban - Right Lower Extremity - unna to anchor o Elevate legs to the level of the heart and pump ankles as often as possible Additional Orders / Instructions Christy Cisneros, Christy Cisneros. ([id number]) Wound #2 Right,Lateral Lower Leg o Increase protein intake. Electronic Signature(s) Signed: 01/04/2017 4:31:20 PM By: Linton Ham MD Signed: 01/04/2017 5:14:09 PM By: Alric Quan Entered By: Alric Quan on 01/04/2017 09:58:18 Christy Cisneros ([id number]) -------------------------------------------------------------------------------- Problem List Details Christy Cisneros Date of Service: 01/04/2017 9:45 AM Patient Name: M. Patient Account Number: [id number] Medical Record Treating RN: Ahmed Prima [id number] Number: Other Clinician: Date of Birth/Sex: 1919-08-18 (83 y.o. Female) Treating Shadell Brenn Primary Care Provider: Einar Pheasant Provider/Extender: G Referring Provider: Antony Salmon in Treatment: 2 Active Problems ICD-10 Encounter Code Description Active Date Diagnosis S81.811D Laceration without foreign body, right lower leg, 12/20/2016 Yes subsequent encounter I87.323 Chronic venous hypertension (idiopathic) with 12/20/2016 Yes inflammation of bilateral lower extremity Inactive Problems Resolved Problems Electronic Signature(s) Signed: 01/04/2017 4:31:20 PM By: Linton Ham MD Entered By: Linton Ham on 01/04/2017 10:14:20 Christy Cisneros ([id number]) -------------------------------------------------------------------------------- Progress Note Details Christy Cisneros Date of Service: 01/04/2017 9:45 AM Patient Name: Christy Cisneros. Patient Account Number: [id number] Medical Record Treating RN: Ahmed Prima [id number] Number: Other Clinician: Date of Birth/Sex: 1919-09-20 (83 y.o. 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 12/20/16; Patient here for a review of a non healing traumatic wound on the right lateral leg History of Present Illness (HPI) Very pleasant 83 year old with no significant past medical history. No diabetes or peripheral vascular disease. She traumatized her right anterior calf on a car door on 07/08/2014. She is slowly improving with regular debridements, compression, and. No significant pain. No claudication or rest pain. Right ABI 1.02. She walks a mile a day. No drainage. No fever or chills. She returns to clinic today and says that the  ulceration has healed. 12/20/16; This is a 83 year old women who traumatized her right leg against a table while adjusting a shade in her home. She suffered a skin tear. She was seen 2 weeks ago in her primary doctor's office and had a non stick dressing placed with kerlix and coban and that has apparently remained in place since then. She is complaining of pain but no systemic symptoms. I note she has been in this clinic 3 years ago for a similar presentation on the right leg.She was not seen by any of the current providers in our practice. She has compression stockings at home but does not use them. She has venous insufficiency. ABI's in this clinic 1.09 12/27/16; arrives today with the wound in better condition. Dimension smaller. We've been using Prisma 01/04/17; patient's wound on the right anterior calf looks a lot better this week dimension smaller base of the wound looks healthy. She has been using Prisma under 2 layer compression. She has chronic bilateral venous insufficiency and tells me she has stockings at  home that she has not been wearing Objective Constitutional Sitting or standing Blood Pressure is within target range for patient.. Pulse regular and within target range Christy Cisneros, Christy Cisneros. ([id number]) for patient.Marland Kitchen Respirations regular, non-labored and within target range.. Temperature is normal and within the target range for the patient.Marland Kitchen appears in no distress. Vitals Time Taken: 9:44 AM, Height: 62 in, Weight: 102.9 lbs, BMI: 18.8, Temperature: 97.5 F, Pulse: 78 bpm, Respiratory Rate: 16 breaths/min, Blood Pressure: 137/67 mmHg. Eyes Conjunctivae clear. No discharge. Respiratory Respiratory effort is easy and symmetric bilaterally. Rate is normal at rest and on room air.. Cardiovascular Pedal pulses palpable and strong bilaterally.. Changes of chronic venous insufficiency but minimal edema. Lymphatic Nonpalpable in the popliteal or inguinal area. Psychiatric No evidence of depression, anxiety, or agitation. Calm, cooperative, and communicative. Appropriate interactions and affect.. General Notes: Wound exam; continues to have her wound or in a better condition. No debridement is required and although I thought this might need further requirement for debridement I am doubtful today that we'll be chew. There is no evidence of surrounding infection. No arterial issues. She has chronic venous disease however she has minimal edema. Integumentary (Hair, Skin) Wound #2 status is Open. Original cause of wound was Trauma. The wound is located on the Right,Lateral Lower Leg. The wound measures 2.3cm length x 0.3cm width x 0.1cm depth; 0.542cm^2 area and 0.054cm^3 volume. There is Fat Layer (Subcutaneous Tissue) Exposed exposed. There is no tunneling or undermining noted. There is a large amount of serosanguineous drainage noted. The wound margin is distinct with the outline attached to the wound base. There is large (67-100%) red granulation within the wound bed. There is a small (1-33%)  amount of necrotic tissue within the wound bed including Adherent Slough. The periwound skin appearance exhibited: Ecchymosis. The periwound skin appearance did not exhibit: Callus, Crepitus, Excoriation, Induration, Rash, Scarring, Dry/Scaly, Maceration, Atrophie Blanche, Cyanosis, Hemosiderin Staining, Mottled, Pallor, Rubor, Erythema. Periwound temperature was noted as No Abnormality. The periwound has tenderness on palpation. Assessment Active Problems ICD-10 S81.811D - Laceration without foreign body, right lower leg, subsequent encounter Christy Cisneros, Christy Cisneros ([id number]) I87.323 - Chronic venous hypertension (idiopathic) with inflammation of bilateral lower extremity Plan Wound Cleansing: Wound #2 Right,Lateral Lower Leg: Clean wound with Normal Saline. Cleanse wound with mild soap and water Anesthetic: Wound #2 Right,Lateral Lower Leg: Topical Lidocaine 4% cream applied to wound bed prior to debridement Primary Wound Dressing: Wound #2  Right,Lateral Lower Leg: Hydrogel Prisma Ag Secondary Dressing: Wound #2 Right,Lateral Lower Leg: ABD pad Non-adherent pad Dressing Change Frequency: Wound #2 Right,Lateral Lower Leg: Change dressing every week Follow-up Appointments: Wound #2 Right,Lateral Lower Leg: Return Appointment in 1 week. Edema Control: Wound #2 Right,Lateral Lower Leg: Kerlix and Coban - Right Lower Extremity - unna to anchor Elevate legs to the level of the heart and pump ankles as often as possible Additional Orders / Instructions: Wound #2 Right,Lateral Lower Leg: Increase protein intake. #1 we will continue with Prisma, hydrogel, ABDs, Kerlix and Coban. #2 hopefully this rate about 2 wakes away from healing #3 will try to have another discussion with her about compression stockings next week although I'm doubtful she will agree. She has had wounds on the right leg before Christy Cisneros, Christy Cisneros ([id number]) Electronic Signature(s) Signed: 01/04/2017  10:22:39 AM By: Linton Ham MD Entered By: Linton Ham on 01/04/2017 10:22:38 Christy Cisneros, Christy Cisneros ([id number]) -------------------------------------------------------------------------------- Christy Cisneros Date of Service: 01/04/2017 Patient Name: Christy Cisneros. Patient Account Number: [id number] Medical Record Treating RN: Ahmed Prima [id number] Number: Other Clinician: Date of Birth/Sex: May 04, 1920 (83 y.o. Female) Treating Kaisyn Millea Primary Care Provider: Einar Pheasant Provider/Extender: G Referring Provider: Antony Salmon in Treatment: 2 Diagnosis Coding ICD-10 Codes Code Description S81.811D Laceration without foreign body, right lower leg, subsequent encounter I87.323 Chronic venous hypertension (idiopathic) with inflammation of bilateral lower extremity Facility Procedures CPT4 Code: [id number] Description: 28366 - WOUND CARE VISIT-LEV 3 EST PT Modifier: Quantity: 1 Physician Procedures CPT4: Description Modifier Quantity Code [id number] - WC PHYS LEVEL 3 - EST PT 1 ICD-10 Description Diagnosis S81.811D Laceration without foreign body, right lower leg, subsequent encounter I87.323 Chronic venous hypertension (idiopathic) with  inflammation of bilateral lower extremity Electronic Signature(s) Signed: 01/04/2017 4:31:20 PM By: Linton Ham MD Signed: 01/04/2017 5:14:09 PM By: Alric Quan Entered By: Alric Quan on 01/04/2017 10:31:47

## 2017-01-05 NOTE — Progress Notes (Signed)
SOLEI, WUBBEN ([id number]) Visit Report for 01/04/2017 Arrival Information Details Christy Cisneros Date of Service: 01/04/2017 9:45 AM Patient Name: M. Patient Account Number: [id number] Medical Record Treating RN: Christy Cisneros [id number] Number: Other Clinician: Date of Birth/Sex: [birth year] (83 y.o. Female) Treating Christy Cisneros Primary Care Christy Cisneros: Christy Cisneros/Extender: G Referring Christy Cisneros: Christy Cisneros in Treatment: 2 Visit Information History Since Last Visit All ordered tests and consults were completed: No Patient Arrived: Ambulatory Added or deleted any medications: No Arrival Time: 09:29 Any new allergies or adverse reactions: No Accompanied By: friend Had a fall or experienced change in No Transfer Assistance: None activities of daily living that may affect Patient Identification Verified: Yes risk of falls: Secondary Verification Process Yes Signs or symptoms of abuse/neglect since last No Completed: visito Patient Requires Transmission-Based No Hospitalized since last visit: No Precautions: Has Dressing in Place as Prescribed: Yes Patient Has Alerts: No Has Compression in Place as Prescribed: Yes Pain Present Now: No Electronic Signature(s) Signed: 01/04/2017 5:14:09 PM By: Alric Quan Entered By: Alric Quan on 01/04/2017 09:55:30 [name] ([id number]) -------------------------------------------------------------------------------- Clinic Level of Care Assessment Details Christy Cisneros Date of Service: 01/04/2017 9:45 AM Patient Name: M. Patient Account Number: [id number] Medical Record Treating RN: Christy Cisneros [id number] Number: Other Clinician: Date of Birth/Sex: [birth year] (83 y.o. Female) Treating ROBSON, [place] Primary Care Christy Cisneros: Christy Cisneros/Extender: G Referring Christy Cisneros: Christy Cisneros in Treatment: 2 Clinic Level of Care Assessment Items TOOL 4 Quantity  Score X - Use when only an EandM is performed on FOLLOW-UP visit 1 0 ASSESSMENTS - Nursing Assessment / Reassessment X - Reassessment of Co-morbidities (includes updates in patient status) 1 10 X - Reassessment of Adherence to Treatment Plan 1 5 ASSESSMENTS - Wound and Skin Assessment / Reassessment X - Simple Wound Assessment / Reassessment - one wound 1 5 []  - Complex Wound Assessment / Reassessment - multiple wounds 0 []  - Dermatologic / Skin Assessment (not related to wound area) 0 ASSESSMENTS - Focused Assessment []  - Circumferential Edema Measurements - multi extremities 0 []  - Nutritional Assessment / Counseling / Intervention 0 []  - Lower Extremity Assessment (monofilament, tuning fork, pulses) 0 []  - Peripheral Arterial Disease Assessment (using hand held doppler) 0 ASSESSMENTS - Ostomy and/or Continence Assessment and Care []  - Incontinence Assessment and Management 0 []  - Ostomy Care Assessment and Management (repouching, etc.) 0 PROCESS - Coordination of Care []  - Simple Patient / Family Education for ongoing care 0 X - Complex (extensive) Patient / Family Education for ongoing care 1 20 X - Staff obtains Programmer, systems, Records, Test Results / Process Orders 1 10 []  - Staff telephones HHA, Nursing Homes / Clarify orders / etc 0 Christy Cisneros ([id number]) []  - Routine Transfer to another Facility (non-emergent condition) 0 []  - Routine Hospital Admission (non-emergent condition) 0 []  - New Admissions / Biomedical engineer / Ordering NPWT, Apligraf, etc. 0 []  - Emergency Hospital Admission (emergent condition) 0 X - Simple Discharge Coordination 1 10 []  - Complex (extensive) Discharge Coordination 0 PROCESS - Special Needs []  - Pediatric / Minor Patient Management 0 []  - Isolation Patient Management 0 []  - Hearing / Language / Visual special needs 0 []  - Assessment of Community assistance (transportation, D/C planning, etc.) 0 []  - Additional assistance / Altered  mentation 0 []  - Support Surface(s) Assessment (bed, cushion, seat, etc.) 0 INTERVENTIONS - Wound Cleansing / Measurement X - Simple Wound Cleansing - one wound 1 5 []  - Complex Wound Cleansing -  multiple wounds 0 X - Wound Imaging (photographs - any number of wounds) 1 5 []  - Wound Tracing (instead of photographs) 0 X - Simple Wound Measurement - one wound 1 5 []  - Complex Wound Measurement - multiple wounds 0 INTERVENTIONS - Wound Dressings []  - Small Wound Dressing one or multiple wounds 0 []  - Medium Wound Dressing one or multiple wounds 0 X - Large Wound Dressing one or multiple wounds 1 20 X - Application of Medications - topical 1 5 []  - Application of Medications - injection 0 [name], [name] ([id number]) INTERVENTIONS - Miscellaneous []  - External ear exam 0 []  - Specimen Collection (cultures, biopsies, blood, body fluids, etc.) 0 []  - Specimen(s) / Culture(s) sent or taken to Lab for analysis 0 []  - Patient Transfer (multiple staff / Civil Service fast streamer / Similar devices) 0 []  - Simple Staple / Suture removal (25 or less) 0 []  - Complex Staple / Suture removal (26 or more) 0 []  - Hypo / Hyperglycemic Management (close monitor of Blood Glucose) 0 []  - Ankle / Brachial Index (ABI) - do not check if billed separately 0 X - Vital Signs 1 5 Has the patient been seen at the hospital within the last three years: Yes Total Score: 105 Level Of Care: New/Established - Level 3 Electronic Signature(s) Signed: 01/04/2017 5:14:09 PM By: Alric Quan Entered By: Alric Quan on 01/04/2017 10:31:39 [name] ([id number]) -------------------------------------------------------------------------------- Encounter Discharge Information Details Christy Cisneros Date of Service: 01/04/2017 9:45 AM Patient Name: Christy Mages. Patient Account Number: [id number] Medical Record Treating RN: Christy Cisneros [id number] Number: Other Clinician: Date of Birth/Sex: 07-22-1920 (83 y.o. Female)  Treating Christy Cisneros Primary Care Aiyana Stegmann: Christy Pheasant Mateja Dier/Extender: G Referring Piya Mesch: Christy Cisneros in Treatment: 2 Encounter Discharge Information Items Discharge Pain Level: 0 Discharge Condition: Stable Ambulatory Status: Ambulatory Discharge Destination: Home Private Transportation: Auto Accompanied By: friend Schedule Follow-up Appointment: Yes Medication Reconciliation completed and No provided to Patient/Care Hiep Ollis: Clinical Summary of Care: Electronic Signature(s) Signed: 01/04/2017 5:14:09 PM By: Alric Quan Entered By: Alric Quan on 01/04/2017 09:54:57 [name] ([id number]) -------------------------------------------------------------------------------- Lower Extremity Assessment Details Christy Cisneros Date of Service: 01/04/2017 9:45 AM Patient Name: Christy Mages. Patient Account Number: [id number] Medical Record Treating RN: Christy Cisneros [id number] Number: Other Clinician: Date of Birth/Sex: 03-08-1920 (83 y.o. Female) Treating Christy Cisneros Primary Care Jarred Purtee: Christy Pheasant Brendyn Mclaren/Extender: G Referring Shantice Menger: Christy Cisneros in Treatment: 2 Vascular Assessment Pulses: Dorsalis Pedis Palpable: [Right:Yes] Posterior Tibial Extremity colors, hair growth, and conditions: Extremity Color: [Right:Mottled] Temperature of Extremity: [Right:Warm] Capillary Refill: [Right:< 3 seconds] Toe Nail Assessment Left: Right: Thick: No Discolored: No Deformed: No Improper Length and Hygiene: Yes Electronic Signature(s) Signed: 01/04/2017 5:14:09 PM By: Alric Quan Entered By: Alric Quan on 01/04/2017 09:52:29 [name] ([id number]) -------------------------------------------------------------------------------- Multi Wound Chart Details Christy Cisneros Date of Service: 01/04/2017 9:45 AM Patient Name: Christy Mages. Patient Account Number: [id number] Medical Record Treating RN: Christy Cisneros [id number] Number: Other Clinician: Date of Birth/Sex: Apr 26, 1920 (83 y.o. Female) Treating Christy Cisneros Primary Care Ripken Rekowski: Christy Pheasant Deardra Hinkley/Extender: G Referring Olan Kurek: Christy Cisneros in Treatment: 2 Vital Signs Height(in): 62 Pulse(bpm): 78 Weight(lbs): 102.9 Blood Pressure 137/67 (mmHg): Body Mass Index(BMI): 19 Temperature(F): 97.5 Respiratory Rate 16 (breaths/min): Photos: [2:No Photos] [N/A:N/A] Wound Location: [2:Right Lower Leg - Lateral] [N/A:N/A] Wounding Event: [2:Trauma] [N/A:N/A] Primary Etiology: [2:Venous Leg Ulcer] [N/A:N/A] Secondary Etiology: [2:Trauma, Other] [N/A:N/A] Comorbid History: [2:Glaucoma, Sleep Apnea, Osteoarthritis] [N/A:N/A] Date Acquired: [2:12/06/2016] [N/A:N/A] Weeks of Treatment: [2:2] [N/A:N/A] Wound Status: [  2:Open] [N/A:N/A] Measurements L x W x D 2.3x0.3x0.1 [N/A:N/A] (cm) Area (cm) : [2:0.542] [N/A:N/A] Volume (cm) : [2:0.054] [N/A:N/A] % Reduction in Area: [2:88.50%] [N/A:N/A] % Reduction in Volume: 88.50% [N/A:N/A] Classification: [2:Partial Thickness] [N/A:N/A] Exudate Amount: [2:Large] [N/A:N/A] Exudate Type: [2:Serosanguineous] [N/A:N/A] Exudate Color: [2:red, brown] [N/A:N/A] Wound Margin: [2:Distinct, outline attached] [N/A:N/A] Granulation Amount: [2:Large (67-100%)] [N/A:N/A] Granulation Quality: [2:Red, Hyper-granulation] [N/A:N/A] Necrotic Amount: [2:Small (1-33%)] [N/A:N/A] Exposed Structures: [2:Fat Layer (Subcutaneous Tissue) Exposed: Yes Fascia: No Tendon: No] [N/A:N/A] Muscle: No Joint: No Bone: No Epithelialization: None N/A N/A Periwound Skin Texture: Excoriation: No N/A N/A Induration: No Callus: No Crepitus: No Rash: No Scarring: No Periwound Skin Maceration: No N/A N/A Moisture: Dry/Scaly: No Periwound Skin Color: Ecchymosis: Yes N/A N/A Atrophie Blanche: No Cyanosis: No Erythema: No Hemosiderin Staining: No Mottled: No Pallor: No Rubor: No Temperature: No  Abnormality N/A N/A Tenderness on Yes N/A N/A Palpation: Wound Preparation: Ulcer Cleansing: N/A N/A Rinsed/Irrigated with Saline Topical Anesthetic Applied: Other: lidocaine 4% Treatment Notes Electronic Signature(s) Signed: 01/04/2017 4:31:20 PM By: Linton Ham MD Entered By: Linton Ham on 01/04/2017 10:14:26 [name] ([id number]) -------------------------------------------------------------------------------- Multi-Disciplinary Care Plan Details Christy Cisneros Date of Service: 01/04/2017 9:45 AM Patient Name: Christy Mages. Patient Account Number: [id number] Medical Record Treating RN: Christy Cisneros [id number] Number: Other Clinician: Date of Birth/Sex: 1920-04-16 (83 y.o. Female) Treating Christy Cisneros Primary Care Dervin Vore: Christy Pheasant Thomasina Housley/Extender: G Referring Maika Mcelveen: Christy Cisneros in Treatment: 2 Active Inactive ` Abuse / Safety / Falls / Self Care Management Nursing Diagnoses: Potential for falls Goals: Patient will not experience any injury related to falls Date Initiated: 12/20/2016 Target Resolution Date: 03/04/2017 Goal Status: Active Interventions: Assess fall risk on admission and as needed Notes: ` Nutrition Nursing Diagnoses: Imbalanced nutrition Potential for alteratiion in Nutrition/Potential for imbalanced nutrition Goals: Patient/caregiver agrees to and verbalizes understanding of need to use nutritional supplements and/or vitamins as prescribed Date Initiated: 12/20/2016 Target Resolution Date: 04/08/2017 Goal Status: Active Interventions: Assess patient nutrition upon admission and as needed per policy Notes: ` Orientation to the [place], [place]. ([id number]) Nursing Diagnoses: Knowledge deficit related to the wound healing center program Goals: Patient/caregiver will verbalize understanding of the [place] Program Date Initiated: 12/20/2016 Target Resolution Date:  01/07/2017 Goal Status: Active Interventions: Provide education on orientation to the wound center Notes: ` Pain, Acute or Chronic Nursing Diagnoses: Pain, acute or chronic: actual or potential Potential alteration in comfort, pain Goals: Patient/caregiver will verbalize adequate pain control between visits Date Initiated: 12/20/2016 Target Resolution Date: 04/08/2017 Goal Status: Active Interventions: Complete pain assessment as per visit requirements Notes: ` Wound/Skin Impairment Nursing Diagnoses: Impaired tissue integrity Knowledge deficit related to ulceration/compromised skin integrity Goals: Ulcer/skin breakdown will have a volume reduction of 80% by week 12 Date Initiated: 12/20/2016 Target Resolution Date: 04/01/2017 Goal Status: Active Interventions: Assess patient/caregiver ability to perform ulcer/skin care regimen upon admission and as needed Notes: YUMIKO, ALKINS ([id number]) Electronic Signature(s) Signed: 01/04/2017 5:14:09 PM By: Alric Quan Entered By: Alric Quan on 01/04/2017 09:53:21 [name] ([id number]) -------------------------------------------------------------------------------- Pain Assessment Details Christy Cisneros Date of Service: 01/04/2017 9:45 AM Patient Name: Christy Mages. Patient Account Number: [id number] Medical Record Treating RN: Christy Cisneros [id number] Number: Other Clinician: Date of Birth/Sex: 17-Jan-1920 (83 y.o. Female) Treating Christy Cisneros Primary Care Musab Wingard: Christy Pheasant Earlie Schank/Extender: G Referring Areyana Leoni: Christy Cisneros in Treatment: 2 Active Problems Location of Pain Severity and Description of Pain Patient Has Paino No Site Locations Pain Management and Medication  Current Pain Management: Electronic Signature(s) Signed: 01/04/2017 5:14:09 PM By: Alric Quan Entered By: Alric Quan on 01/04/2017 09:43:54 [name]  ([id number]) -------------------------------------------------------------------------------- Patient/Caregiver Education Details Christy Cisneros Date of Service: 01/04/2017 9:45 AM Patient Name: Christy Mages. Patient Account Number: [id number] Medical Record Treating RN: Christy Cisneros [id number] Number: Other Clinician: Date of Birth/Gender: 04-01-1920 (83 y.o. Female) Treating Christy Cisneros Primary Care Physician/Extender: Myrtie Cruise Physician: Suella [place] in Treatment: 2 Referring Physician: Einar Pheasant Education Assessment Education Provided To: Patient Education Topics Provided Wound/Skin Impairment: Handouts: Other: change dressing as ordered Methods: Demonstration, Explain/Verbal Responses: State content correctly Electronic Signature(s) Signed: 01/04/2017 5:14:09 PM By: Alric Quan Entered By: Alric Quan on 01/04/2017 09:55:06 [name] ([id number]) -------------------------------------------------------------------------------- Wound Assessment Details Christy Cisneros Date of Service: 01/04/2017 9:45 AM Patient Name: Christy Mages. Patient Account Number: [id number] Medical Record Treating RN: Christy Cisneros [id number] Number: Other Clinician: Date of Birth/Sex: 1920-05-24 (83 y.o. Female) Treating Christy Cisneros Primary Care Marycruz Boehner: Christy Pheasant Huckleberry Martinson/Extender: G Referring Darlene Bartelt: Christy Cisneros in Treatment: 2 Wound Status Wound Number: 2 Primary Etiology: Venous Leg Ulcer Wound Location: Right Lower Leg - Lateral Secondary Trauma, Other Etiology: Wounding Event: Trauma Wound Status: Open Date Acquired: 12/06/2016 Comorbid History: Glaucoma, Sleep Apnea, Weeks Of Treatment: 2 Osteoarthritis Clustered Wound: No Photos Photo Uploaded By: Alric Quan on 01/04/2017 16:51:08 Wound Measurements Length: (cm) 2.3 Width: (cm) 0.3 Depth: (cm) 0.1 Area: (cm) 0.542 Volume: (cm) 0.054 % Reduction in Area: 88.5% % Reduction in  Volume: 88.5% Epithelialization: None Tunneling: No Undermining: No Wound Description Classification: Partial Thickness Foul Odor Aft Wound Margin: Distinct, outline attached Slough/Fibrin Exudate Amount: Large Exudate Type: Serosanguineous Exudate Color: red, brown er Cleansing: No o No Wound Bed Granulation Amount: Large (67-100%) Exposed Structure Granulation Quality: Red, Hyper-granulation Fascia Exposed: No Necrotic Amount: Small (1-33%) Fat Layer (Subcutaneous Tissue) Exposed: Yes SAREA, FYFE ([id number]) Necrotic Quality: Adherent Slough Tendon Exposed: No Muscle Exposed: No Joint Exposed: No Bone Exposed: No Periwound Skin Texture Texture Color No Abnormalities Noted: No No Abnormalities Noted: No Callus: No Atrophie Blanche: No Crepitus: No Cyanosis: No Excoriation: No Ecchymosis: Yes Induration: No Erythema: No Rash: No Hemosiderin Staining: No Scarring: No Mottled: No Pallor: No Moisture Rubor: No No Abnormalities Noted: No Dry / Scaly: No Temperature / Pain Maceration: No Temperature: No Abnormality Tenderness on Palpation: Yes Wound Preparation Ulcer Cleansing: Rinsed/Irrigated with Saline Topical Anesthetic Applied: Other: lidocaine 4%, Treatment Notes Wound #2 (Right, Lateral Lower Leg) 1. Cleansed with: Clean wound with Normal Saline Cleanse wound with antibacterial soap and water 2. Anesthetic Topical Lidocaine 4% cream to wound bed prior to debridement 4. Dressing Applied: Hydrogel Prisma Ag 5. Secondary Dressing Applied ABD Pad Dry Gauze 7. Secured with Tape Notes Kerlix and Event organiser) Signed: 01/04/2017 5:14:09 PM By: Alric Quan Entered By: Alric Quan on 01/04/2017 09:51:43 AUNISTY, REALI ([id number], THOMASTON ([id number]) -------------------------------------------------------------------------------- Vitals Details Christy Cisneros Date of Service: 01/04/2017 9:45  AM Patient Name: Christy Mages. Patient Account Number: [id number] Medical Record Treating RN: Christy Cisneros [id number] Number: Other Clinician: Date of Birth/Sex: September 28, 1919 (83 y.o. Female) Treating Christy Cisneros Primary Care Cambren Helm: Christy Pheasant Kempton Milne/Extender: G Referring Jshawn Hurta: Christy Cisneros in Treatment: 2 Vital Signs Time Taken: 09:44 Temperature (F): 97.5 Height (in): 62 Pulse (bpm): 78 Weight (lbs): 102.9 Respiratory Rate (breaths/min): 16 Body Mass Index (BMI): 18.8 Blood Pressure (mmHg): 137/67 Reference Range: 80 - 120 mg / dl Electronic Signature(s) Signed: 01/04/2017 5:14:09 PM By: Alric Quan Entered By: Alric Quan on  01/04/2017 09:44:11 

## 2017-01-06 DIAGNOSIS — H5211 Myopia, right eye: Secondary | ICD-10-CM | POA: Diagnosis not present

## 2017-01-06 DIAGNOSIS — H04123 Dry eye syndrome of bilateral lacrimal glands: Secondary | ICD-10-CM | POA: Diagnosis not present

## 2017-01-06 DIAGNOSIS — H35363 Drusen (degenerative) of macula, bilateral: Secondary | ICD-10-CM | POA: Diagnosis not present

## 2017-01-06 DIAGNOSIS — H524 Presbyopia: Secondary | ICD-10-CM | POA: Diagnosis not present

## 2017-01-06 DIAGNOSIS — S0502XD Injury of conjunctiva and corneal abrasion without foreign body, left eye, subsequent encounter: Secondary | ICD-10-CM | POA: Diagnosis not present

## 2017-01-06 DIAGNOSIS — H401113 Primary open-angle glaucoma, right eye, severe stage: Secondary | ICD-10-CM | POA: Diagnosis not present

## 2017-01-06 DIAGNOSIS — H47233 Glaucomatous optic atrophy, bilateral: Secondary | ICD-10-CM | POA: Diagnosis not present

## 2017-01-06 DIAGNOSIS — H353131 Nonexudative age-related macular degeneration, bilateral, early dry stage: Secondary | ICD-10-CM | POA: Diagnosis not present

## 2017-01-06 DIAGNOSIS — H401123 Primary open-angle glaucoma, left eye, severe stage: Secondary | ICD-10-CM | POA: Diagnosis not present

## 2017-01-06 DIAGNOSIS — H52221 Regular astigmatism, right eye: Secondary | ICD-10-CM | POA: Diagnosis not present

## 2017-01-11 ENCOUNTER — Encounter: Payer: Medicare Other | Admitting: Internal Medicine

## 2017-01-11 DIAGNOSIS — H35363 Drusen (degenerative) of macula, bilateral: Secondary | ICD-10-CM | POA: Diagnosis not present

## 2017-01-11 DIAGNOSIS — H47233 Glaucomatous optic atrophy, bilateral: Secondary | ICD-10-CM | POA: Diagnosis not present

## 2017-01-11 DIAGNOSIS — H353131 Nonexudative age-related macular degeneration, bilateral, early dry stage: Secondary | ICD-10-CM | POA: Diagnosis not present

## 2017-01-11 DIAGNOSIS — S0502XD Injury of conjunctiva and corneal abrasion without foreign body, left eye, subsequent encounter: Secondary | ICD-10-CM | POA: Diagnosis not present

## 2017-01-11 DIAGNOSIS — H52221 Regular astigmatism, right eye: Secondary | ICD-10-CM | POA: Diagnosis not present

## 2017-01-11 DIAGNOSIS — I87323 Chronic venous hypertension (idiopathic) with inflammation of bilateral lower extremity: Secondary | ICD-10-CM | POA: Diagnosis not present

## 2017-01-11 DIAGNOSIS — Z881 Allergy status to other antibiotic agents status: Secondary | ICD-10-CM | POA: Diagnosis not present

## 2017-01-11 DIAGNOSIS — Z885 Allergy status to narcotic agent status: Secondary | ICD-10-CM | POA: Diagnosis not present

## 2017-01-11 DIAGNOSIS — I872 Venous insufficiency (chronic) (peripheral): Secondary | ICD-10-CM | POA: Diagnosis not present

## 2017-01-11 DIAGNOSIS — H401113 Primary open-angle glaucoma, right eye, severe stage: Secondary | ICD-10-CM | POA: Diagnosis not present

## 2017-01-11 DIAGNOSIS — H524 Presbyopia: Secondary | ICD-10-CM | POA: Diagnosis not present

## 2017-01-11 DIAGNOSIS — H401123 Primary open-angle glaucoma, left eye, severe stage: Secondary | ICD-10-CM | POA: Diagnosis not present

## 2017-01-11 DIAGNOSIS — S81811A Laceration without foreign body, right lower leg, initial encounter: Secondary | ICD-10-CM | POA: Diagnosis not present

## 2017-01-11 DIAGNOSIS — H5211 Myopia, right eye: Secondary | ICD-10-CM | POA: Diagnosis not present

## 2017-01-11 DIAGNOSIS — H04123 Dry eye syndrome of bilateral lacrimal glands: Secondary | ICD-10-CM | POA: Diagnosis not present

## 2017-01-11 DIAGNOSIS — Z888 Allergy status to other drugs, medicaments and biological substances status: Secondary | ICD-10-CM | POA: Diagnosis not present

## 2017-01-11 DIAGNOSIS — S81811D Laceration without foreign body, right lower leg, subsequent encounter: Secondary | ICD-10-CM | POA: Diagnosis not present

## 2017-01-11 DIAGNOSIS — H409 Unspecified glaucoma: Secondary | ICD-10-CM | POA: Diagnosis not present

## 2017-01-12 NOTE — Progress Notes (Signed)
Christy Cisneros, Christy Cisneros ([id number]) Visit Report for 01/11/2017 Arrival Information Details Christy Cisneros, Christy Cisneros Date of Service: 01/11/2017 8:45 AM Patient Name: M. Patient Account Number: [id number] Medical Record Treating RN: Christy Cisneros [id number] Number: Other Clinician: Date of Birth/Sex: 1920/06/07 (83 y.o. Female) Treating Christy Cisneros Primary Care Christy Cisneros: Christy Cisneros Christy Cisneros/Extender: G Referring Jaimy Kliethermes: Antony Salmon in Treatment: 3 Visit Information History Since Last Visit All ordered tests and consults were completed: No Patient Arrived: Ambulatory Added or deleted any medications: No Arrival Time: 09:00 Any new allergies or adverse reactions: No Accompanied By: friend Had a fall or experienced change in No Transfer Assistance: None activities of daily living that may affect Patient Identification Verified: Yes risk of falls: Secondary Verification Process Yes Signs or symptoms of abuse/neglect since last No Completed: visito Patient Requires Transmission-Based No Hospitalized since last visit: No Precautions: Has Dressing in Place as Prescribed: Yes Patient Has Alerts: No Has Compression in Place as Prescribed: Yes Pain Present Now: No Electronic Signature(s) Signed: 01/11/2017 4:50:29 PM By: Christy Cisneros Entered By: Christy Cisneros on 01/11/2017 09:01:08 Christy Cisneros ([id number]) -------------------------------------------------------------------------------- Clinic Level of Care Assessment Details Christy Cisneros Date of Service: 01/11/2017 8:45 AM Patient Name: M. Patient Account Number: [id number] Medical Record Treating RN: Christy Cisneros [id number] Number: Other Clinician: Date of Birth/Sex: 06-11-1920 (83 y.o. Female) Treating ROBSON, [place] Primary Care Merlene Dante: Christy Cisneros Maysel Mccolm/Extender: G Referring Obrien Huskins: Antony Salmon in Treatment: 3 Clinic Level of Care Assessment Items TOOL 4 Quantity  Score X - Use when only an EandM is performed on FOLLOW-UP visit 1 0 ASSESSMENTS - Nursing Assessment / Reassessment X - Reassessment of Co-morbidities (includes updates in patient status) 1 10 X - Reassessment of Adherence to Treatment Plan 1 5 ASSESSMENTS - Wound and Skin Assessment / Reassessment X - Simple Wound Assessment / Reassessment - one wound 1 5 []  - Complex Wound Assessment / Reassessment - multiple wounds 0 []  - Dermatologic / Skin Assessment (not related to wound area) 0 ASSESSMENTS - Focused Assessment []  - Circumferential Edema Measurements - multi extremities 0 []  - Nutritional Assessment / Counseling / Intervention 0 []  - Lower Extremity Assessment (monofilament, tuning fork, pulses) 0 []  - Peripheral Arterial Disease Assessment (using hand held doppler) 0 ASSESSMENTS - Ostomy and/or Continence Assessment and Care []  - Incontinence Assessment and Management 0 []  - Ostomy Care Assessment and Management (repouching, etc.) 0 PROCESS - Coordination of Care X - Simple Patient / Family Education for ongoing care 1 15 []  - Complex (extensive) Patient / Family Education for ongoing care 0 []  - Staff obtains Programmer, systems, Records, Test Results / Process Orders 0 []  - Staff telephones HHA, Nursing Homes / Clarify orders / etc 0 Christy Cisneros, Christy Cisneros ([id number]) []  - Routine Transfer to another Facility (non-emergent condition) 0 []  - Routine Hospital Admission (non-emergent condition) 0 []  - New Admissions / Biomedical engineer / Ordering NPWT, Apligraf, etc. 0 []  - Emergency Hospital Admission (emergent condition) 0 X - Simple Discharge Coordination 1 10 []  - Complex (extensive) Discharge Coordination 0 PROCESS - Special Needs []  - Pediatric / Minor Patient Management 0 []  - Isolation Patient Management 0 []  - Hearing / Language / Visual special needs 0 []  - Assessment of Community assistance (transportation, D/C planning, etc.) 0 []  - Additional assistance / Altered  mentation 0 []  - Support Surface(s) Assessment (bed, cushion, seat, etc.) 0 INTERVENTIONS - Wound Cleansing / Measurement X - Simple Wound Cleansing - one wound 1 5 []  - Complex Wound Cleansing -  multiple wounds 0 X - Wound Imaging (photographs - any number of wounds) 1 5 []  - Wound Tracing (instead of photographs) 0 X - Simple Wound Measurement - one wound 1 5 []  - Complex Wound Measurement - multiple wounds 0 INTERVENTIONS - Wound Dressings []  - Small Wound Dressing one or multiple wounds 0 []  - Medium Wound Dressing one or multiple wounds 0 X - Large Wound Dressing one or multiple wounds 1 20 X - Application of Medications - topical 1 5 []  - Application of Medications - injection 0 Christy Cisneros, Christy Cisneros ([id number]) INTERVENTIONS - Miscellaneous []  - External ear exam 0 []  - Specimen Collection (cultures, biopsies, blood, body fluids, etc.) 0 []  - Specimen(s) / Culture(s) sent or taken to Lab for analysis 0 []  - Patient Transfer (multiple staff / Harrel Lemon Lift / Similar devices) 0 []  - Simple Staple / Suture removal (25 or less) 0 []  - Complex Staple / Suture removal (26 or more) 0 []  - Hypo / Hyperglycemic Management (close monitor of Blood Glucose) 0 []  - Ankle / Brachial Index (ABI) - do not check if billed separately 0 X - Vital Signs 1 5 Has the patient been seen at the hospital within the last three years: Yes Total Score: 90 Level Of Care: New/Established - Level 3 Electronic Signature(s) Signed: 01/11/2017 4:50:29 PM By: Christy Cisneros Entered By: Christy Cisneros on 01/11/2017 09:33:36 Christy Cisneros ([id number]) -------------------------------------------------------------------------------- Encounter Discharge Information Details Christy Cisneros Date of Service: 01/11/2017 8:45 AM Patient Name: Christy Mages. Patient Account Number: [id number] Medical Record Treating RN: Christy Cisneros [id number] Number: Other Clinician: Date of Birth/Sex: 10/20/1919 (82 y.o. Female)  Treating Christy Cisneros Primary Care Ester Mabe: Christy Cisneros Geri Hepler/Extender: G Referring Daphane Odekirk: Antony Salmon in Treatment: 3 Encounter Discharge Information Items Discharge Pain Level: 0 Discharge Condition: Stable Ambulatory Status: Ambulatory Discharge Destination: Home Transportation: Private Auto Accompanied By: friend Schedule Follow-up Appointment: Yes Medication Reconciliation completed and provided to Patient/Care No Genavie Boettger: Provided on Clinical Summary of Care: 01/11/2017 Form Type Recipient Paper Patient FO Electronic Signature(s) Signed: 01/11/2017 9:27:18 AM By: Ruthine Dose Entered By: Ruthine Dose on 01/11/2017 09:27:18 Christy Cisneros ([id number]) -------------------------------------------------------------------------------- Lower Extremity Assessment Details Christy Cisneros Date of Service: 01/11/2017 8:45 AM Patient Name: Christy Mages. Patient Account Number: [id number] Medical Record Treating RN: Christy Cisneros [id number] Number: Other Clinician: Date of Birth/Sex: 1920-02-22 (83 y.o. Female) Treating Christy Cisneros Primary Care Robbyn Hodkinson: Christy Cisneros Emmogene Simson/Extender: G Referring Bernardino Dowell: Antony Salmon in Treatment: 3 Vascular Assessment Pulses: Dorsalis Pedis Palpable: [Right:Yes] Posterior Tibial Extremity colors, hair growth, and conditions: Extremity Color: [Right:Mottled] Temperature of Extremity: [Right:Warm] Capillary Refill: [Right:< 3 seconds] Toe Nail Assessment Left: Right: Thick: No Discolored: No Deformed: No Improper Length and Hygiene: Yes Electronic Signature(s) Signed: 01/11/2017 4:50:29 PM By: Christy Cisneros Entered By: Christy Cisneros on 01/11/2017 09:12:08 Christy Cisneros ([id number]) -------------------------------------------------------------------------------- Multi Wound Chart Details Christy Cisneros Date of Service: 01/11/2017 8:45 AM Patient Name: Christy Mages. Patient Account  Number: [id number] Medical Record Treating RN: Christy Cisneros [id number] Number: Other Clinician: Date of Birth/Sex: 1920-06-18 (83 y.o. Female) Treating Christy Cisneros Primary Care Sharnelle Cappelli: Christy Cisneros Aesha Agrawal/Extender: G Referring Mance Vallejo: Antony Salmon in Treatment: 3 Vital Signs Height(in): 62 Pulse(bpm): 64 Weight(lbs): 102.9 Blood Pressure 118/57 (mmHg): Body Mass Index(BMI): 19 Temperature(F): 97.5 Respiratory Rate 16 (breaths/min): Photos: [2:No Photos] [N/A:N/A] Wound Location: [2:Right Lower Leg - Lateral] [N/A:N/A] Wounding Event: [2:Trauma] [N/A:N/A] Primary Etiology: [2:Venous Leg Ulcer] [N/A:N/A] Secondary Etiology: [2:Trauma, Other] [N/A:N/A] Comorbid History: [2:Glaucoma, Sleep Apnea, Osteoarthritis] [N/A:N/A] Date Acquired: [  2:12/06/2016] [N/A:N/A] Weeks of Treatment: [2:3] [N/A:N/A] Wound Status: [2:Open] [N/A:N/A] Measurements L x W x D 1.9x0.5x0.1 [N/A:N/A] (cm) Area (cm) : [2:0.746] [N/A:N/A] Volume (cm) : [2:0.075] [N/A:N/A] % Reduction in Area: [2:84.20%] [N/A:N/A] % Reduction in Volume: 84.10% [N/A:N/A] Classification: [2:Partial Thickness] [N/A:N/A] Exudate Amount: [2:Large] [N/A:N/A] Exudate Type: [2:Serosanguineous] [N/A:N/A] Exudate Color: [2:red, brown] [N/A:N/A] Wound Margin: [2:Distinct, outline attached] [N/A:N/A] Granulation Amount: [2:Large (67-100%)] [N/A:N/A] Granulation Quality: [2:Red, Hyper-granulation] [N/A:N/A] Necrotic Amount: [2:Small (1-33%)] [N/A:N/A] Exposed Structures: [2:Fat Layer (Subcutaneous Tissue) Exposed: Yes Fascia: No Tendon: No] [N/A:N/A] Muscle: No Joint: No Bone: No Epithelialization: None N/A N/A Periwound Skin Texture: Excoriation: No N/A N/A Induration: No Callus: No Crepitus: No Rash: No Scarring: No Periwound Skin Maceration: No N/A N/A Moisture: Dry/Scaly: No Periwound Skin Color: Ecchymosis: Yes N/A N/A Atrophie Blanche: No Cyanosis: No Erythema: No Hemosiderin Staining:  No Mottled: No Pallor: No Rubor: No Temperature: No Abnormality N/A N/A Tenderness on Yes N/A N/A Palpation: Wound Preparation: Ulcer Cleansing: N/A N/A Rinsed/Irrigated with Saline Topical Anesthetic Applied: Other: lidocaine 4% Treatment Notes Electronic Signature(s) Signed: 01/11/2017 5:26:18 PM By: Linton Ham MD Entered By: Linton Ham on 01/11/2017 09:19:11 Christy Cisneros ([id number]) -------------------------------------------------------------------------------- Multi-Disciplinary Care Plan Details Christy Cisneros Date of Service: 01/11/2017 8:45 AM Patient Name: Christy Mages. Patient Account Number: [id number] Medical Record Treating RN: Christy Cisneros [id number] Number: Other Clinician: Date of Birth/Sex: [birth year] (83 y.o. Female) Treating Christy Cisneros Primary Care Demitris Pokorny: Christy Cisneros Ahtziry Saathoff/Extender: G Referring Shem Plemmons: Antony Salmon in Treatment: 3 Active Inactive ` Abuse / Safety / Falls / Self Care Management Nursing Diagnoses: Potential for falls Goals: Patient will not experience any injury related to falls Date Initiated: 12/20/2016 Target Resolution Date: 03/04/2017 Goal Status: Active Interventions: Assess fall risk on admission and as needed Notes: ` Nutrition Nursing Diagnoses: Imbalanced nutrition Potential for alteratiion in Nutrition/Potential for imbalanced nutrition Goals: Patient/caregiver agrees to and verbalizes understanding of need to use nutritional supplements and/or vitamins as prescribed Date Initiated: 12/20/2016 Target Resolution Date: 04/08/2017 Goal Status: Active Interventions: Assess patient nutrition upon admission and as needed per policy Notes: ` Orientation to the [place]. ([id number]) Nursing Diagnoses: Knowledge deficit related to the wound healing center program Goals: Patient/caregiver will verbalize understanding of the [place]  Program Date Initiated: 12/20/2016 Target Resolution Date: 01/07/2017 Goal Status: Active Interventions: Provide education on orientation to the wound center Notes: ` Pain, Acute or Chronic Nursing Diagnoses: Pain, acute or chronic: actual or potential Potential alteration in comfort, pain Goals: Patient/caregiver will verbalize adequate pain control between visits Date Initiated: 12/20/2016 Target Resolution Date: 04/08/2017 Goal Status: Active Interventions: Complete pain assessment as per visit requirements Notes: ` Wound/Skin Impairment Nursing Diagnoses: Impaired tissue integrity Knowledge deficit related to ulceration/compromised skin integrity Goals: Ulcer/skin breakdown will have a volume reduction of 80% by week 12 Date Initiated: 12/20/2016 Target Resolution Date: 04/01/2017 Goal Status: Active Interventions: Assess patient/caregiver ability to perform ulcer/skin care regimen upon admission and as needed Notes: Christy Cisneros, Christy Cisneros ([id number]) Electronic Signature(s) Signed: 01/11/2017 4:50:29 PM By: Christy Cisneros Entered By: Christy Cisneros on 01/11/2017 09:12:12 Christy Cisneros ([id number]) -------------------------------------------------------------------------------- Pain Assessment Details Christy Cisneros Date of Service: 01/11/2017 8:45 AM Patient Name: Christy Mages. Patient Account Number: [id number] Medical Record Treating RN: Christy Cisneros [id number] Number: Other Clinician: Date of Birth/Sex: 1920-06-26 (83 y.o. Female) Treating Christy Cisneros Primary Care Saksham Akkerman: Christy Cisneros Eleesha Purkey/Extender: G Referring Janilah Hojnacki: Antony Salmon in Treatment: 3 Active Problems Location of Pain Severity and Description of Pain Patient  Has Paino No Site Locations With Dressing Change: No Pain Management and Medication Current Pain Management: Electronic Signature(s) Signed: 01/11/2017 4:50:29 PM By: Christy Cisneros Entered By: Christy Cisneros on  01/11/2017 09:01:14 Christy Cisneros ([id number]) -------------------------------------------------------------------------------- Patient/Caregiver Education Details Christy Cisneros Date of Service: 01/11/2017 8:45 AM Patient Name: Christy Mages. Patient Account Number: [id number] Medical Record Treating RN: Christy Cisneros [id number] Number: Other Clinician: Date of Birth/Gender: 04/30/1920 (83 y.o. Female) Treating Christy Cisneros Primary Care Physician/Extender: Myrtie Cruise Physician: Suella [place] in Treatment: 3 Referring Physician: Einar Cisneros Education Assessment Education Provided To: Patient Education Topics Provided Wound/Skin Impairment: Handouts: Other: change dressing as ordered Methods: Demonstration, Explain/Verbal Responses: State content correctly Electronic Signature(s) Signed: 01/11/2017 4:50:29 PM By: Christy Cisneros Entered By: Christy Cisneros on 01/11/2017 09:14:22 Christy Cisneros ([id number]) -------------------------------------------------------------------------------- Wound Assessment Details Christy Cisneros Date of Service: 01/11/2017 8:45 AM Patient Name: Christy Mages. Patient Account Number: [id number] Medical Record Treating RN: Christy Cisneros [id number] Number: Other Clinician: Date of Birth/Sex: 11-Jan-1920 (83 y.o. Female) Treating Christy Cisneros Primary Care Mylisa Brunson: Christy Cisneros Marquia Costello/Extender: G Referring Virjean Boman: Antony Salmon in Treatment: 3 Wound Status Wound Number: 2 Primary Etiology: Venous Leg Ulcer Wound Location: Right Lower Leg - Lateral Secondary Trauma, Other Etiology: Wounding Event: Trauma Wound Status: Open Date Acquired: 12/06/2016 Comorbid History: Glaucoma, Sleep Apnea, Weeks Of Treatment: 3 Osteoarthritis Clustered Wound: No Photos Photo Uploaded By: Christy Cisneros on 01/11/2017 10:27:56 Wound Measurements Length: (cm) 1.9 Width: (cm) 0.5 Depth: (cm) 0.1 Area: (cm) 0.746 Volume: (cm)  0.075 % Reduction in Area: 84.2% % Reduction in Volume: 84.1% Epithelialization: None Tunneling: No Undermining: No Wound Description Classification: Partial Thickness Foul Odor Aft Wound Margin: Distinct, outline attached Slough/Fibrin Exudate Amount: Large Exudate Type: Serosanguineous Exudate Color: red, brown er Cleansing: No o No Wound Bed Granulation Amount: Large (67-100%) Exposed Structure Granulation Quality: Red, Hyper-granulation Fascia Exposed: No Necrotic Amount: Small (1-33%) Fat Layer (Subcutaneous Tissue) Exposed: Yes Christy Cisneros, Christy Cisneros ([id number]) Necrotic Quality: Adherent Slough Tendon Exposed: No Muscle Exposed: No Joint Exposed: No Bone Exposed: No Periwound Skin Texture Texture Color No Abnormalities Noted: No No Abnormalities Noted: No Callus: No Atrophie Blanche: No Crepitus: No Cyanosis: No Excoriation: No Ecchymosis: Yes Induration: No Erythema: No Rash: No Hemosiderin Staining: No Scarring: No Mottled: No Pallor: No Moisture Rubor: No No Abnormalities Noted: No Dry / Scaly: No Temperature / Pain Maceration: No Temperature: No Abnormality Tenderness on Palpation: Yes Wound Preparation Ulcer Cleansing: Rinsed/Irrigated with Saline Topical Anesthetic Applied: Other: lidocaine 4%, Treatment Notes Wound #2 (Right, Lateral Lower Leg) 1. Cleansed with: Clean wound with Normal Saline Cleanse wound with antibacterial soap and water 2. Anesthetic Topical Lidocaine 4% cream to wound bed prior to debridement 4. Dressing Applied: Prisma Ag 5. Secondary Dressing Applied ABD Pad Dry Gauze 7. 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 Patient Account Number: [id number] Medical Record Treating RN: Christy Cisneros [id number] Number: Other Clinician: Date of Birth/Sex: 07-25-1920 (83 y.o. Female) Treating [place]BS[place], [place] Primary Care Francene Mcerlean: Christy Cisneros Issa Luster/Extender: G Referring Doreather Hoxworth: Antony Salmon in Treatment: 3 Vital Signs Time Taken: 09:01 Temperature (F): 97.5 Height (in): 62 Pulse (bpm): 64 Weight (lbs): 102.9 Respiratory Rate (breaths/min): 16 Body Mass Index (BMI): 18.8 Blood Pressure (mmHg): 118/57 Reference Range: 80 - 120 mg / dl Electronic Signature(s) Signed: 01/11/2017 4:50:29 PM  By: Christy Cisneros Entered By: Christy Cisneros on 01/11/2017 09:03:20

## 2017-01-12 NOTE — Progress Notes (Signed)
Christy, Cisneros ([id number]) Visit Report for 01/11/2017 Chief Complaint Document Details [name], [name] Date of Service: 01/11/2017 8:45 AM Patient Name: M. Patient Account Number: [id number] Medical Record Treating RN: Ahmed Prima [id number] Number: Other Clinician: Date of Birth/Sex: [birth year] (83 y.o. Female) Treating Linton Ham Primary Care Provider: Einar Pheasant Provider/Extender: G Referring Provider: Antony Salmon in Treatment: 3 Information Obtained from: Patient Chief Complaint Right calf traumatic ulcer since early Dec 2015. 12/20/16; Patient here for a review of a non healing traumatic wound on the right lateral leg Electronic Signature(s) Signed: 01/11/2017 5:26:18 PM By: Linton Ham MD Entered By: Linton Ham on 01/11/2017 09:19:24 Christy Cisneros ([id number]) -------------------------------------------------------------------------------- HPI Details Christy Cisneros Date of Service: 01/11/2017 8:45 AM Patient Name: Christy Cisneros. Patient Account Number: [id number] Medical Record Treating RN: Ahmed Prima [id number] Number: Other Clinician: Date of Birth/Sex: [birth year] (83 y.o. Female) Treating ROBSON, MICHAEL Primary Care Provider: Einar Pheasant Provider/Extender: G Referring Provider: Antony Salmon in Treatment: 3 History of Present Illness HPI Description: Very pleasant 83 year old with no significant past medical history. No diabetes or peripheral vascular disease. She traumatized her right anterior calf on a car door on 07/08/2014. She is slowly improving with regular debridements, compression, and. No significant pain. No claudication or rest pain. Right ABI 1.02. She walks a mile a day. No drainage. No fever or chills. She returns to clinic today and says that the ulceration has healed. 12/20/16; This is a 83 year old women who traumatized her right leg against a table while adjusting a shade in her home. She  suffered a skin tear. She was seen 2 weeks ago in her primary doctor's office and had a non stick dressing placed with kerlix and coban and that has apparently remained in place since then. She is complaining of pain but no systemic symptoms. I note she has been in this clinic 3 years ago for a similar presentation on the right leg.She was not seen by any of the current providers in our practice. She has compression stockings at home but does not use them. She has venous insufficiency. ABI's in this clinic 1.09 12/27/16; arrives today with the wound in better condition. Dimension smaller. We've been using Prisma 01/04/17; patient's wound on the right anterior calf looks a lot better this week dimension smaller base of the wound looks healthy. She has been using Prisma under 2 layer compression. She has chronic bilateral venous insufficiency and tells me she has stockings at home that she has not been wearing 01/11/17; continued improvement in the right anterior lateral wound which was initially trauma in the setting of chronic venous insufficiency. We have been using Prisma under 2 layer compression Electronic Signature(s) Signed: 01/11/2017 5:26:18 PM By: Linton Ham MD Entered By: Linton Ham on 01/11/2017 09:19:57 Christy Cisneros ([id number]) -------------------------------------------------------------------------------- Physical Exam Details Christy Cisneros Date of Service: 01/11/2017 8:45 AM Patient Name: Christy Cisneros. Patient Account Number: [id number] Medical Record Treating RN: Ahmed Prima [id number] Number: Other Clinician: Date of Birth/Sex: 30-Jul-1920 (83 y.o. Female) Treating ROBSON, MICHAEL Primary Care Provider: Einar Pheasant Provider/Extender: G Referring Provider: Antony Salmon in Treatment: 3 Constitutional Sitting or standing Blood Pressure is within target range for patient.. Pulse regular and within target range for patient.Marland Kitchen Respirations regular,  non-labored and within target range.. Temperature is normal and within the target range for the patient.Marland Kitchen appears in no distress. Eyes Conjunctivae clear. No discharge. Cardiovascular Femoral arteries without bruits and pulses strong.. Pedal pulses palpable and strong bilaterally.. Lymphatic Nonpalpable the popliteal  or inguinal area. Psychiatric No evidence of depression, anxiety, or agitation. Calm, cooperative, and communicative. Appropriate interactions and affect.. Notes Wound exam; continued improvement in the condition of the wound. Base of this appears healthy no debridement is required. No evidence of surrounding infection and no major difficulties with surrounding edema Electronic Signature(s) Signed: 01/11/2017 5:26:18 PM By: Linton Ham MD Entered By: Linton Ham on 01/11/2017 09:21:14 Christy Cisneros ([id number]) -------------------------------------------------------------------------------- Physician Orders Details Christy Cisneros Date of Service: 01/11/2017 8:45 AM Patient Name: Christy Cisneros. Patient Account Number: [id number] Medical Record Treating RN: Ahmed Prima [id number] Number: Other Clinician: Date of Birth/Sex: 12/20/1919 (83 y.o. Female) Treating ROBSON, MICHAEL Primary Care Provider: Einar Pheasant Provider/Extender: G Referring Provider: Antony Salmon in Treatment: 3 Verbal / Phone Orders: Yes Clinician: Pinkerton, Debi Read Back and Verified: Yes Diagnosis Coding Wound Cleansing Wound #2 Right,Lateral Lower Leg o Clean wound with Normal Saline. o Cleanse wound with mild soap and water Anesthetic Wound #2 Right,Lateral Lower Leg o Topical Lidocaine 4% cream applied to wound bed prior to debridement Primary Wound Dressing Wound #2 Right,Lateral Lower Leg o Hydrogel o Prisma Ag Secondary Dressing Wound #2 Right,Lateral Lower Leg o ABD pad o Non-adherent pad Dressing Change Frequency Wound #2 Right,Lateral Lower  Leg o Change dressing every week Follow-up Appointments Wound #2 Right,Lateral Lower Leg o Return Appointment in 1 week. Edema Control Wound #2 Right,Lateral Lower Leg o Kerlix and Coban - Right Lower Extremity - unna to anchor o Elevate legs to the level of the heart and pump ankles as often as possible Additional Orders / Instructions Christy Cisneros, MCKINNIE. ([id number]) Wound #2 Right,Lateral Lower Leg o Increase protein intake. Electronic Signature(s) Signed: 01/11/2017 4:50:29 PM By: Alric Quan Signed: 01/11/2017 5:26:18 PM By: Linton Ham MD Entered By: Alric Quan on 01/11/2017 09:17:36 Christy Cisneros ([id number]) -------------------------------------------------------------------------------- Problem List Details Christy Cisneros Date of Service: 01/11/2017 8:45 AM Patient Name: M. Patient Account Number: [id number] Medical Record Treating RN: Ahmed Prima [id number] Number: Other Clinician: Date of Birth/Sex: February 06, 1920 (83 y.o. Female) Treating ROBSON, MICHAEL Primary Care Provider: Einar Pheasant Provider/Extender: G Referring Provider: Antony Salmon in Treatment: 3 Active Problems ICD-10 Encounter Code Description Active Date Diagnosis S81.811D Laceration without foreign body, right lower leg, 12/20/2016 Yes subsequent encounter I87.323 Chronic venous hypertension (idiopathic) with 12/20/2016 Yes inflammation of bilateral lower extremity Inactive Problems Resolved Problems Electronic Signature(s) Signed: 01/11/2017 5:26:18 PM By: Linton Ham MD Entered By: Linton Ham on 01/11/2017 09:18:58 Christy Cisneros ([id number]) -------------------------------------------------------------------------------- Progress Note Details Christy Cisneros Date of Service: 01/11/2017 8:45 AM Patient Name: Christy Cisneros. Patient Account Number: [id number] Medical Record Treating RN: Ahmed Prima [id number] Number: Other  Clinician: Date of Birth/Sex: 04-08-1920 (83 y.o. 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 12/20/16; Patient here for a review of a non healing traumatic wound on the right lateral leg History of Present Illness (HPI) Very pleasant 83 year old with no significant past medical history. No diabetes or peripheral vascular disease. She traumatized her right anterior calf on a car door on 07/08/2014. She is slowly improving with regular debridements, compression, and. No significant pain. No claudication or rest pain. Right ABI 1.02. She walks a mile a day. No drainage. No fever or chills. She returns to clinic today and says that the ulceration has healed. 12/20/16; This is a 83 year old women who traumatized her  right leg against a table while adjusting a shade in her home. She suffered a skin tear. She was seen 2 weeks ago in her primary doctor's office and had a non stick dressing placed with kerlix and coban and that has apparently remained in place since then. She is complaining of pain but no systemic symptoms. I note she has been in this clinic 3 years ago for a similar presentation on the right leg.She was not seen by any of the current providers in our practice. She has compression stockings at home but does not use them. She has venous insufficiency. ABI's in this clinic 1.09 12/27/16; arrives today with the wound in better condition. Dimension smaller. We've been using Prisma 01/04/17; patient's wound on the right anterior calf looks a lot better this week dimension smaller base of the wound looks healthy. She has been using Prisma under 2 layer compression. She has chronic bilateral venous insufficiency and tells me she has stockings at home that she has not been  wearing 01/11/17; continued improvement in the right anterior lateral wound which was initially trauma in the setting of chronic venous insufficiency. We have been using Prisma under 2 layer compression Objective Christy Cisneros, DUPAS. ([id number]) Constitutional Sitting or standing Blood Pressure is within target range for patient.. Pulse regular and within target range for patient.Marland Kitchen Respirations regular, non-labored and within target range.. Temperature is normal and within the target range for the patient.Marland Kitchen appears in no distress. Vitals Time Taken: 9:01 AM, Height: 62 in, Weight: 102.9 lbs, BMI: 18.8, Temperature: 97.5 F, Pulse: 64 bpm, Respiratory Rate: 16 breaths/min, Blood Pressure: 118/57 mmHg. Eyes Conjunctivae clear. No discharge. Cardiovascular Femoral arteries without bruits and pulses strong.. Pedal pulses palpable and strong bilaterally.. Lymphatic Nonpalpable the popliteal or inguinal area. Psychiatric No evidence of depression, anxiety, or agitation. Calm, cooperative, and communicative. Appropriate interactions and affect.. General Notes: Wound exam; continued improvement in the condition of the wound. Base of this appears healthy no debridement is required. No evidence of surrounding infection and no major difficulties with surrounding edema Integumentary (Hair, Skin) Wound #2 status is Open. Original cause of wound was Trauma. The wound is located on the Right,Lateral Lower Leg. The wound measures 1.9cm length x 0.5cm width x 0.1cm depth; 0.746cm^2 area and 0.075cm^3 volume. There is Fat Layer (Subcutaneous Tissue) Exposed exposed. There is no tunneling or undermining noted. There is a large amount of serosanguineous drainage noted. The wound margin is distinct with the outline attached to the wound base. There is large (67-100%) red granulation within the wound bed. There is a small (1-33%) amount of necrotic tissue within the wound bed including Adherent Slough. The  periwound skin appearance exhibited: Ecchymosis. The periwound skin appearance did not exhibit: Callus, Crepitus, Excoriation, Induration, Rash, Scarring, Dry/Scaly, Maceration, Atrophie Blanche, Cyanosis, Hemosiderin Staining, Mottled, Pallor, Rubor, Erythema. Periwound temperature was noted as No Abnormality. The periwound has tenderness on palpation. Assessment Active Problems ICD-10 S81.811D - Laceration without foreign body, right lower leg, subsequent encounter I87.323 - Chronic venous hypertension (idiopathic) with inflammation of bilateral lower extremity Christy Cisneros, PUSKAS. ([id number]) Plan Wound Cleansing: Wound #2 Right,Lateral Lower Leg: Clean wound with Normal Saline. Cleanse wound with mild soap and water Anesthetic: Wound #2 Right,Lateral Lower Leg: Topical Lidocaine 4% cream applied to wound bed prior to debridement Primary Wound Dressing: Wound #2 Right,Lateral Lower Leg: Hydrogel Prisma Ag Secondary Dressing: Wound #2 Right,Lateral Lower Leg: ABD pad Non-adherent pad Dressing Change Frequency: Wound #2 Right,Lateral Lower Leg: Change dressing every  week Follow-up Appointments: Wound #2 Right,Lateral Lower Leg: Return Appointment in 1 week. Edema Control: Wound #2 Right,Lateral Lower Leg: Kerlix and Coban - Right Lower Extremity - unna to anchor Elevate legs to the level of the heart and pump ankles as often as possible Additional Orders / Instructions: Wound #2 Right,Lateral Lower Leg: Increase protein intake. #1 continue with Prisma under 2 layer compression #2 patient should be healed in the next 2 weeks. I think next week would be optimistic however #3 I've asked her to bring the stocking she has at home in the clinic next week although she has not been wearing them Christy Cisneros, Christy Cisneros ([id number]) Electronic Signature(s) Signed: 01/11/2017 5:26:18 PM By: Linton Ham MD Entered By: Linton Ham on 01/11/2017 09:22:05 Christy Cisneros  ([id number]) -------------------------------------------------------------------------------- SuperBill Details Christy Cisneros Date of Service: 01/11/2017 Patient Name: Christy Cisneros. Patient Account Number: [id number] Medical Record Treating RN: Ahmed Prima [id number] Number: Other Clinician: Date of Birth/Sex: 01-29-1920 (83 y.o. Female) Treating ROBSON, MICHAEL Primary Care Provider: Einar Pheasant Provider/Extender: G Referring Provider: Antony Salmon in Treatment: 3 Diagnosis Coding ICD-10 Codes Code Description S81.811D Laceration without foreign body, right lower leg, subsequent encounter I87.323 Chronic venous hypertension (idiopathic) with inflammation of bilateral lower extremity Facility Procedures CPT4 Code: [id number] Description: 99213 - WOUND CARE VISIT-LEV 3 EST PT Modifier: Quantity: 1 Physician Procedures CPT4: Description Modifier Quantity Code [id number] - WC PHYS LEVEL 3 - EST PT 1 ICD-10 Description Diagnosis S81.811D Laceration without foreign body, right lower leg, subsequent encounter I87.323 Chronic venous hypertension (idiopathic) with  inflammation of bilateral lower extremity Electronic Signature(s) Signed: 01/11/2017 4:50:29 PM By: Alric Quan Signed: 01/11/2017 5:26:18 PM By: Linton Ham MD Entered By: Alric Quan on 01/11/2017 09:33:43

## 2017-01-18 ENCOUNTER — Encounter: Payer: Medicare Other | Admitting: Internal Medicine

## 2017-01-18 DIAGNOSIS — Z885 Allergy status to narcotic agent status: Secondary | ICD-10-CM | POA: Diagnosis not present

## 2017-01-18 DIAGNOSIS — Z888 Allergy status to other drugs, medicaments and biological substances status: Secondary | ICD-10-CM | POA: Diagnosis not present

## 2017-01-18 DIAGNOSIS — H409 Unspecified glaucoma: Secondary | ICD-10-CM | POA: Diagnosis not present

## 2017-01-18 DIAGNOSIS — I872 Venous insufficiency (chronic) (peripheral): Secondary | ICD-10-CM | POA: Diagnosis not present

## 2017-01-18 DIAGNOSIS — S81811D Laceration without foreign body, right lower leg, subsequent encounter: Secondary | ICD-10-CM | POA: Diagnosis not present

## 2017-01-18 DIAGNOSIS — Z881 Allergy status to other antibiotic agents status: Secondary | ICD-10-CM | POA: Diagnosis not present

## 2017-01-18 DIAGNOSIS — I87323 Chronic venous hypertension (idiopathic) with inflammation of bilateral lower extremity: Secondary | ICD-10-CM | POA: Diagnosis not present

## 2017-01-18 DIAGNOSIS — S81811A Laceration without foreign body, right lower leg, initial encounter: Secondary | ICD-10-CM | POA: Diagnosis not present

## 2017-01-20 NOTE — Progress Notes (Signed)
Christy Cisneros, Christy Cisneros ([id number]) Visit Report for 01/18/2017 HPI Details Christy Cisneros, Christy Cisneros Date of Service: 01/18/2017 8:45 AM Patient Name: M. Patient Account Number: [id number] Medical Record Treating RN: Ahmed Prima [id number] Number: Other Clinician: Date of Birth/Sex: 1920/03/03 (83 y.o. Female) Treating Christy Cisneros Primary Care Provider: Einar Pheasant Provider/Extender: G Referring Provider: Antony Salmon in Treatment: 4 History of Present Illness HPI Description: Very pleasant 83 year old with no significant past medical history. No diabetes or peripheral vascular disease. She traumatized her right anterior calf on a car door on 07/08/2014. She is slowly improving with regular debridements, compression, and. No significant pain. No claudication or rest pain. Right ABI 1.02. She walks a mile a day. No drainage. No fever or chills. She returns to clinic today and says that the ulceration has healed. 12/20/16; This is a 83 year old women who traumatized her right leg against a table while adjusting a shade in her home. She suffered a skin tear. She was seen 2 weeks ago in her primary doctor's office and had a non stick dressing placed with kerlix and coban and that has apparently remained in place since then. She is complaining of pain but no systemic symptoms. I note she has been in this clinic 3 years ago for a similar presentation on the right leg.She was not seen by any of the current providers in our practice. She has compression stockings at home but does not use them. She has venous insufficiency. ABI's in this clinic 1.09 12/27/16; arrives today with the wound in better condition. Dimension smaller. We've been using Prisma 01/04/17; patient's wound on the right anterior calf looks a lot better this week dimension smaller base of the wound looks healthy. She has been using Prisma under 2 layer compression. She has chronic bilateral venous insufficiency and tells  me she has stockings at home that she has not been wearing 01/11/17; continued improvement in the right anterior lateral wound which was initially trauma in the setting of chronic venous insufficiency. We have been using Prisma under 2 layer compression 01/18/17; continued improvement in the right anterior leg wound which was initially traumatic in the setting of chronic venous insufficiency. We have been using Prisma under 2 layer compression. Nice improvement in terms of wound dimensions Electronic Signature(s) Signed: 01/18/2017 5:43:18 PM By: Linton Ham MD Entered By: Linton Ham on 01/18/2017 08:46:31 Christy Cisneros ([id number]) -------------------------------------------------------------------------------- Physical Exam Details Christy Cisneros Date of Service: 01/18/2017 8:45 AM Patient Name: Christy Mages. Patient Account Number: [id number] Medical Record Treating RN: Ahmed Prima [id number] Number: Other Clinician: Date of Birth/Sex: 1920-01-21 (83 y.o. Female) Treating Deklen Popelka Primary Care Provider: Einar Pheasant Provider/Extender: G Referring Provider: Antony Salmon in Treatment: 4 Constitutional Sitting or standing Blood Pressure is within target range for patient.. Pulse regular and within target range for patient.Marland Kitchen Respirations regular, non-labored and within target range.. Temperature is normal and within the target range for the patient.Marland Kitchen appears in no distress. Respiratory Respiratory effort is easy and symmetric bilaterally. Rate is normal at rest and on room air.. Cardiovascular Pedal pulses palpable and strong bilaterally.. Lymphatic None palpable in the right popliteal or inguinal area. Integumentary (Hair, Skin) Patient has considerable chronic hemosiderin deposition in her bilateral lower legs but little in the way of inflammation. Psychiatric No evidence of depression, anxiety, or agitation. Calm, cooperative, and communicative.  Appropriate interactions and affect.. Notes Wound exam; continued improvement in the condition of the wound healthy base no surrounding infection. No debridement is required. There is no  evidence of concomitant arterial insufficiency. The wound is making nice progress in terms of the condition of the wound bed and overall wound area Electronic Signature(s) Signed: 01/18/2017 5:43:18 PM By: Linton Ham MD Entered By: Linton Ham on 01/18/2017 08:47:58 Christy Cisneros ([id number]) -------------------------------------------------------------------------------- Physician Orders Details Christy Cisneros Date of Service: 01/18/2017 8:45 AM Patient Name: Christy Mages. Patient Account Number: [id number] Medical Record Treating RN: Ahmed Prima [id number] Number: Other Clinician: Date of Birth/Sex: 1920-03-22 (83 y.o. Female) Treating Dickson Kostelnik Primary Care Provider: Einar Pheasant Provider/Extender: G Referring Provider: Antony Salmon in Treatment: 4 Verbal / Phone Orders: Yes Clinician: Pinkerton, Debi Read Back and Verified: Yes Diagnosis Coding Wound Cleansing Wound #2 Right,Lateral Lower Leg o Clean wound with Normal Saline. o Cleanse wound with mild soap and water Anesthetic Wound #2 Right,Lateral Lower Leg o Topical Lidocaine 4% cream applied to wound bed prior to debridement Primary Wound Dressing Wound #2 Right,Lateral Lower Leg o Hydrogel o Prisma Ag Secondary Dressing Wound #2 Right,Lateral Lower Leg o ABD pad o Non-adherent pad Dressing Change Frequency Wound #2 Right,Lateral Lower Leg o Change dressing every week Follow-up Appointments Wound #2 Right,Lateral Lower Leg o Return Appointment in 1 week. Edema Control Wound #2 Right,Lateral Lower Leg o Kerlix and Coban - Right Lower Extremity - unna to anchor o Elevate legs to the level of the heart and pump ankles as often as possible Additional Orders /  Instructions Christy Cisneros, Christy Cisneros. ([id number]) Wound #2 Right,Lateral Lower Leg o Increase protein intake. Electronic Signature(s) Signed: 01/18/2017 5:12:54 PM By: Alric Quan Signed: 01/18/2017 5:43:18 PM By: Linton Ham MD Entered By: Alric Quan on 01/18/2017 08:39:53 Christy Cisneros, Christy Cisneros ([id number]) -------------------------------------------------------------------------------- Problem List Details Christy Cisneros Date of Service: 01/18/2017 8:45 AM Patient Name: M. Patient Account Number: [id number] Medical Record Treating RN: Ahmed Prima [id number] Number: Other Clinician: Date of Birth/Sex: May 28, 1920 (83 y.o. Female) Treating Nastasia Kage Primary Care Provider: Einar Pheasant Provider/Extender: G Referring Provider: Antony Salmon in Treatment: 4 Active Problems ICD-10 Encounter Code Description Active Date Diagnosis S81.811D Laceration without foreign body, right lower leg, 12/20/2016 Yes subsequent encounter I87.323 Chronic venous hypertension (idiopathic) with 12/20/2016 Yes inflammation of bilateral lower extremity Inactive Problems Resolved Problems Electronic Signature(s) Signed: 01/18/2017 5:43:18 PM By: Linton Ham MD Entered By: Linton Ham on 01/18/2017 08:45:32 Christy Cisneros ([id number]) -------------------------------------------------------------------------------- Progress Note Details Christy Cisneros Date of Service: 01/18/2017 8:45 AM Patient Name: Christy Mages. Patient Account Number: [id number] Medical Record Treating RN: Ahmed Prima [id number] Number: Other Clinician: Date of Birth/Sex: [birth year] (83 y.o. Female) Treating Hollyanne Schloesser Primary Care Provider: Einar Pheasant Provider/Extender: G Referring Provider: Antony Salmon in Treatment: 4 Subjective History of Present Illness (HPI) Very pleasant 83 year old with no significant past medical history. No diabetes or peripheral  vascular disease. She traumatized her right anterior calf on a car door on 07/08/2014. She is slowly improving with regular debridements, compression, and. No significant pain. No claudication or rest pain. Right ABI 1.02. She walks a mile a day. No drainage. No fever or chills. She returns to clinic today and says that the ulceration has healed. 12/20/16; This is a 83 year old women who traumatized her right leg against a table while adjusting a shade in her home. She suffered a skin tear. She was seen 2 weeks ago in her primary doctor's office and had a non stick dressing placed with kerlix and coban and that has apparently remained in place since then. She is complaining of pain but no systemic symptoms. I  note she has been in this clinic 3 years ago for a similar presentation on the right leg.She was not seen by any of the current providers in our practice. She has compression stockings at home but does not use them. She has venous insufficiency. ABI's in this clinic 1.09 12/27/16; arrives today with the wound in better condition. Dimension smaller. We've been using Prisma 01/04/17; patient's wound on the right anterior calf looks a lot better this week dimension smaller base of the wound looks healthy. She has been using Prisma under 2 layer compression. She has chronic bilateral venous insufficiency and tells me she has stockings at home that she has not been wearing 01/11/17; continued improvement in the right anterior lateral wound which was initially trauma in the setting of chronic venous insufficiency. We have been using Prisma under 2 layer compression 01/18/17; continued improvement in the right anterior leg wound which was initially traumatic in the setting of chronic venous insufficiency. We have been using Prisma under 2 layer compression. Nice improvement in terms of wound dimensions Objective Constitutional Sitting or standing Blood Pressure is within target range for patient..  Pulse regular and within target range for patient.Marland Kitchen Respirations regular, non-labored and within target range.. Temperature is normal and within Christy Cisneros, Christy Cisneros. ([id number]) the target range for the patient.Marland Kitchen appears in no distress. Vitals Time Taken: 8:28 AM, Height: 62 in, Weight: 102.9 lbs, BMI: 18.8, Temperature: 97.6 F, Pulse: 79 bpm, Respiratory Rate: 18 breaths/min, Blood Pressure: 131/65 mmHg. Respiratory Respiratory effort is easy and symmetric bilaterally. Rate is normal at rest and on room air.. Cardiovascular Pedal pulses palpable and strong bilaterally.. Lymphatic None palpable in the right popliteal or inguinal area. Psychiatric No evidence of depression, anxiety, or agitation. Calm, cooperative, and communicative. Appropriate interactions and affect.. General Notes: Wound exam; continued improvement in the condition of the wound healthy base no surrounding infection. No debridement is required. There is no evidence of concomitant arterial insufficiency. The wound is making nice progress in terms of the condition of the wound bed and overall wound area Integumentary (Hair, Skin) Patient has considerable chronic hemosiderin deposition in her bilateral lower legs but little in the way of inflammation. Wound #2 status is Open. Original cause of wound was Trauma. The wound is located on the Right,Lateral Lower Leg. The wound measures 0.5cm length x 0.4cm width x 0.1cm depth; 0.157cm^2 area and 0.016cm^3 volume. There is Fat Layer (Subcutaneous Tissue) Exposed exposed. There is no tunneling or undermining noted. There is a large amount of serosanguineous drainage noted. The wound margin is distinct with the outline attached to the wound base. There is large (67-100%) red granulation within the wound bed. There is a small (1-33%) amount of necrotic tissue within the wound bed including Adherent Slough. The periwound skin appearance exhibited: Ecchymosis. The periwound skin  appearance did not exhibit: Callus, Crepitus, Excoriation, Induration, Rash, Scarring, Dry/Scaly, Maceration, Atrophie Blanche, Cyanosis, Hemosiderin Staining, Mottled, Pallor, Rubor, Erythema. Periwound temperature was noted as No Abnormality. The periwound has tenderness on palpation. Assessment Active Problems ICD-10 S81.811D - Laceration without foreign body, right lower leg, subsequent encounter I87.323 - Chronic venous hypertension (idiopathic) with inflammation of bilateral lower extremity Christy Cisneros, Christy Cisneros. ([id number]) Plan Wound Cleansing: Wound #2 Right,Lateral Lower Leg: Clean wound with Normal Saline. Cleanse wound with mild soap and water Anesthetic: Wound #2 Right,Lateral Lower Leg: Topical Lidocaine 4% cream applied to wound bed prior to debridement Primary Wound Dressing: Wound #2 Right,Lateral Lower Leg: Hydrogel Prisma Ag Secondary Dressing: Wound #  2 Right,Lateral Lower Leg: ABD pad Non-adherent pad Dressing Change Frequency: Wound #2 Right,Lateral Lower Leg: Change dressing every week Follow-up Appointments: Wound #2 Right,Lateral Lower Leg: Return Appointment in 1 week. Edema Control: Wound #2 Right,Lateral Lower Leg: Kerlix and Coban - Right Lower Extremity - unna to anchor Elevate legs to the level of the heart and pump ankles as often as possible Additional Orders / Instructions: Wound #2 Right,Lateral Lower Leg: Increase protein intake. #1 there is no reason to change the primary dressing. We continue with silver collagen, ABDs, 2 layer compression #2 I had asked the patient to bring but ever stocking she has at home but she says she can't find the bag there are in. She has a prior history of wounds on the legs related to minor trauma. She does not have a lot in the way of edema therefore she may be able to get by with simple support hose rather than true compression stockings. I've asked her to bring what she has next week Christy Cisneros, Christy Cisneros  ([id number]) Electronic Signature(s) Signed: 01/18/2017 5:43:18 PM By: Linton Ham MD Entered By: Linton Ham on 01/18/2017 08:49:32 Christy Cisneros, Christy Cisneros ([id number]) -------------------------------------------------------------------------------- Tonye Pearson Date of Service: 01/18/2017 Patient Name: Christy Mages. Patient Account Number: [id number] Medical Record Treating RN: Ahmed Prima [id number] Number: Other Clinician: Date of Birth/Sex: 11-15-1919 (83 y.o. Female) Treating Edgardo Petrenko Primary Care Provider: Einar Pheasant Provider/Extender: G Referring Provider: Antony Salmon in Treatment: 4 Diagnosis Coding ICD-10 Codes Code Description S81.811D Laceration without foreign body, right lower leg, subsequent encounter I87.323 Chronic venous hypertension (idiopathic) with inflammation of bilateral lower extremity Facility Procedures CPT4 Code: [id number] Description: 99213 - WOUND CARE VISIT-LEV 3 EST PT Modifier: Quantity: 1 Physician Procedures CPT4: Description Modifier Quantity Code [id number] 89211 - WC PHYS LEVEL 3 - EST PT 1 ICD-10 Description Diagnosis S81.811D Laceration without foreign body, right lower leg, subsequent encounter I87.323 Chronic venous hypertension (idiopathic) with  inflammation of bilateral lower extremity Electronic Signature(s) Signed: 01/18/2017 5:12:54 PM By: Alric Quan Signed: 01/18/2017 5:43:18 PM By: Linton Ham MD Entered By: Alric Quan on 01/18/2017 10:10:17

## 2017-01-20 NOTE — Progress Notes (Signed)
NAHIMA, ALES ([id number]) Visit Report for 01/18/2017 Arrival Information Details SACHIKO, METHOT Date of Service: 01/18/2017 8:45 AM Patient Name: M. Patient Account Number: [id number] Medical Record Treating RN: Ahmed Prima [id number] Number: Other Clinician: Date of Birth/Sex: 09/21/1919 (83 y.o. Female) Treating ROBSON, MICHAEL Primary Care Stepheny Canal: Einar Pheasant Jimi Giza/Extender: G Referring Karleen Seebeck: Antony Salmon in Treatment: 4 Visit Information History Since Last Visit All ordered tests and consults were completed: No Patient Arrived: Ambulatory Added or deleted any medications: No Arrival Time: 08:27 Any new allergies or adverse reactions: No Accompanied By: friend Had a fall or experienced change in No Transfer Assistance: None activities of daily living that may affect Patient Identification Verified: Yes risk of falls: Secondary Verification Process Yes Signs or symptoms of abuse/neglect since last No Completed: visito Patient Requires Transmission-Based No Hospitalized since last visit: No Precautions: Has Dressing in Place as Prescribed: Yes Patient Has Alerts: No Has Compression in Place as Prescribed: Yes Pain Present Now: No Electronic Signature(s) Signed: 01/18/2017 5:12:54 PM By: Alric Quan Entered By: Alric Quan on 01/18/2017 08:28:22 [name] ([id number]) -------------------------------------------------------------------------------- Clinic Level of Care Assessment Details [name] Date of Service: 01/18/2017 8:45 AM Patient Name: M. Patient Account Number: [id number] Medical Record Treating RN: Ahmed Prima [id number] Number: Other Clinician: Date of Birth/Sex: 04-23-1920 (83 y.o. Female) Treating ROBSON, [place] Primary Care Averey Trompeter: Einar Pheasant Ellana Kawa/Extender: G Referring Aveon Colquhoun: Antony Salmon in Treatment: 4 Clinic Level of Care Assessment Items TOOL 4 Quantity  Score X - Use when only an EandM is performed on FOLLOW-UP visit 1 0 ASSESSMENTS - Nursing Assessment / Reassessment X - Reassessment of Co-morbidities (includes updates in patient status) 1 10 X - Reassessment of Adherence to Treatment Plan 1 5 ASSESSMENTS - Wound and Skin Assessment / Reassessment X - Simple Wound Assessment / Reassessment - one wound 1 5 []  - Complex Wound Assessment / Reassessment - multiple wounds 0 []  - Dermatologic / Skin Assessment (not related to wound area) 0 ASSESSMENTS - Focused Assessment []  - Circumferential Edema Measurements - multi extremities 0 []  - Nutritional Assessment / Counseling / Intervention 0 []  - Lower Extremity Assessment (monofilament, tuning fork, pulses) 0 []  - Peripheral Arterial Disease Assessment (using hand held doppler) 0 ASSESSMENTS - Ostomy and/or Continence Assessment and Care []  - Incontinence Assessment and Management 0 []  - Ostomy Care Assessment and Management (repouching, etc.) 0 PROCESS - Coordination of Care []  - Simple Patient / Family Education for ongoing care 0 X - Complex (extensive) Patient / Family Education for ongoing care 1 20 []  - Staff obtains Programmer, systems, Records, Test Results / Process Orders 0 []  - Staff telephones HHA, Nursing Homes / Clarify orders / etc 0 LUCENDIA, LEARD ([id number]) []  - Routine Transfer to another Facility (non-emergent condition) 0 []  - Routine Hospital Admission (non-emergent condition) 0 []  - New Admissions / Biomedical engineer / Ordering NPWT, Apligraf, etc. 0 []  - Emergency Hospital Admission (emergent condition) 0 X - Simple Discharge Coordination 1 10 []  - Complex (extensive) Discharge Coordination 0 PROCESS - Special Needs []  - Pediatric / Minor Patient Management 0 []  - Isolation Patient Management 0 []  - Hearing / Language / Visual special needs 0 []  - Assessment of Community assistance (transportation, D/C planning, etc.) 0 []  - Additional assistance / Altered  mentation 0 []  - Support Surface(s) Assessment (bed, cushion, seat, etc.) 0 INTERVENTIONS - Wound Cleansing / Measurement X - Simple Wound Cleansing - one wound 1 5 []  - Complex Wound Cleansing -  multiple wounds 0 X - Wound Imaging (photographs - any number of wounds) 1 5 []  - Wound Tracing (instead of photographs) 0 X - Simple Wound Measurement - one wound 1 5 []  - Complex Wound Measurement - multiple wounds 0 INTERVENTIONS - Wound Dressings []  - Small Wound Dressing one or multiple wounds 0 []  - Medium Wound Dressing one or multiple wounds 0 X - Large Wound Dressing one or multiple wounds 1 20 X - Application of Medications - topical 1 5 []  - Application of Medications - injection 0 JAKAYLAH, SCHLAFER. ([id number]) INTERVENTIONS - Miscellaneous []  - External ear exam 0 []  - Specimen Collection (cultures, biopsies, blood, body fluids, etc.) 0 []  - Specimen(s) / Culture(s) sent or taken to Lab for analysis 0 []  - Patient Transfer (multiple staff / Harrel Lemon Lift / Similar devices) 0 []  - Simple Staple / Suture removal (25 or less) 0 []  - Complex Staple / Suture removal (26 or more) 0 []  - Hypo / Hyperglycemic Management (close monitor of Blood Glucose) 0 []  - Ankle / Brachial Index (ABI) - do not check if billed separately 0 X - Vital Signs 1 5 Has the patient been seen at the hospital within the last three years: Yes Total Score: 95 Level Of Care: New/Established - Level 3 Electronic Signature(s) Signed: 01/18/2017 5:12:54 PM By: Alric Quan Entered By: Alric Quan on 01/18/2017 10:10:08 [name] ([id number]) -------------------------------------------------------------------------------- Encounter Discharge Information Details [name] Date of Service: 01/18/2017 8:45 AM Patient Name: [name] Patient Account Number: [id number] Medical Record Treating RN: Ahmed Prima [id number] Number: Other Clinician: Date of Birth/Sex: 07/28/1920 (83 y.o. Female)  Treating ROBSON, MICHAEL Primary Care Yetzali Weld: Einar Pheasant Stavros Cail/Extender: G Referring Lauran Romanski: Antony Salmon in Treatment: 4 Encounter Discharge Information Items Discharge Pain Level: 0 Discharge Condition: Stable Ambulatory Status: Ambulatory Discharge Destination: Home Transportation: Private Auto Accompanied By: friend Schedule Follow-up Appointment: Yes Medication Reconciliation completed and provided to Patient/Care No Salimata Christenson: Provided on Clinical Summary of Care: 01/18/2017 Form Type Recipient Paper Patient FO Electronic Signature(s) Signed: 01/18/2017 5:12:54 PM By: Alric Quan Previous Signature: 01/18/2017 8:51:47 AM Version By: Ruthine Dose Entered By: Alric Quan on 01/18/2017 08:59:39 [name] ([id number]) -------------------------------------------------------------------------------- Lower Extremity Assessment Details [name] Date of Service: 01/18/2017 8:45 AM Patient Name: [name] Patient Account Number: [id number] Medical Record Treating RN: Ahmed Prima [id number] Number: Other Clinician: Date of Birth/Sex: [birth year] (83 y.o. Female) Treating ROBSON, MICHAEL Primary Care Iden Stripling: Einar Pheasant Murrell Elizondo/Extender: G Referring Hareem Surowiec: Antony Salmon in Treatment: 4 Vascular Assessment Pulses: Dorsalis Pedis Palpable: [Right:Yes] Posterior Tibial Extremity colors, hair growth, and conditions: Extremity Color: [Right:Hyperpigmented] Temperature of Extremity: [Right:Warm] Capillary Refill: [Right:< 3 seconds] Electronic Signature(s) Signed: 01/18/2017 5:12:54 PM By: Alric Quan Entered By: Alric Quan on 01/18/2017 08:37:00 [name] ([id number]) -------------------------------------------------------------------------------- Multi Wound Chart Details [name] Date of Service: 01/18/2017 8:45 AM Patient Name: [name] Patient Account Number: [id number] Medical Record  Treating RN: Ahmed Prima [id number] Number: Other Clinician: Date of Birth/Sex: 08/08/1919 (83 y.o. Female) Treating ROBSON, MICHAEL Primary Care Theodore Virgin: Einar Pheasant Nyheim Seufert/Extender: G Referring Janika Jedlicka: Antony Salmon in Treatment: 4 Vital Signs Height(in): 62 Pulse(bpm): 79 Weight(lbs): 102.9 Blood Pressure 131/65 (mmHg): Body Mass Index(BMI): 19 Temperature(F): 97.6 Respiratory Rate 18 (breaths/min): Photos: [2:No Photos] [N/A:N/A] Wound Location: [2:Right Lower Leg - Lateral] [N/A:N/A] Wounding Event: [2:Trauma] [N/A:N/A] Primary Etiology: [2:Venous Leg Ulcer] [N/A:N/A] Secondary Etiology: [2:Trauma, Other] [N/A:N/A] Comorbid History: [2:Glaucoma, Sleep Apnea, Osteoarthritis] [N/A:N/A] Date Acquired: [2:12/06/2016] [N/A:N/A] Weeks of Treatment: [2:4] [N/A:N/A]  Wound Status: [2:Open] [N/A:N/A] Measurements L x W x D 0.5x0.4x0.1 [N/A:N/A] (cm) Area (cm) : [2:0.157] [N/A:N/A] Volume (cm) : [2:0.016] [N/A:N/A] % Reduction in Area: [2:96.70%] [N/A:N/A] % Reduction in Volume: 96.60% [N/A:N/A] Classification: [2:Partial Thickness] [N/A:N/A] Exudate Amount: [2:Large] [N/A:N/A] Exudate Type: [2:Serosanguineous] [N/A:N/A] Exudate Color: [2:red, brown] [N/A:N/A] Wound Margin: [2:Distinct, outline attached] [N/A:N/A] Granulation Amount: [2:Large (67-100%)] [N/A:N/A] Granulation Quality: [2:Red, Hyper-granulation] [N/A:N/A] Necrotic Amount: [2:Small (1-33%)] [N/A:N/A] Exposed Structures: [2:Fat Layer (Subcutaneous Tissue) Exposed: Yes Fascia: No Tendon: No] [N/A:N/A] Muscle: No Joint: No Bone: No Epithelialization: Medium (34-66%) N/A N/A Periwound Skin Texture: Excoriation: No N/A N/A Induration: No Callus: No Crepitus: No Rash: No Scarring: No Periwound Skin Maceration: No N/A N/A Moisture: Dry/Scaly: No Periwound Skin Color: Ecchymosis: Yes N/A N/A Atrophie Blanche: No Cyanosis: No Erythema: No Hemosiderin Staining: No Mottled:  No Pallor: No Rubor: No Temperature: No Abnormality N/A N/A Tenderness on Yes N/A N/A Palpation: Wound Preparation: Ulcer Cleansing: N/A N/A Rinsed/Irrigated with Saline Topical Anesthetic Applied: Other: lidocaine 4% Treatment Notes Electronic Signature(s) Signed: 01/18/2017 5:43:18 PM By: Linton Ham MD Entered By: Linton Ham on 01/18/2017 08:45:46 [name] ([id number]) -------------------------------------------------------------------------------- Multi-Disciplinary Care Plan Details [name] Date of Service: 01/18/2017 8:45 AM Patient Name: [name] Patient Account Number: [id number] Medical Record Treating RN: Ahmed Prima [id number] Number: Other Clinician: Date of Birth/Sex: January 15, 1920 (83 y.o. Female) Treating ROBSON, MICHAEL Primary Care Haelyn Forgey: Einar Pheasant Jonel Weldon/Extender: G Referring Lizbett Garciagarcia: Antony Salmon in Treatment: 4 Active Inactive ` Abuse / Safety / Falls / Self Care Management Nursing Diagnoses: Potential for falls Goals: Patient will not experience any injury related to falls Date Initiated: 12/20/2016 Target Resolution Date: 03/04/2017 Goal Status: Active Interventions: Assess fall risk on admission and as needed Notes: ` Nutrition Nursing Diagnoses: Imbalanced nutrition Potential for alteratiion in Nutrition/Potential for imbalanced nutrition Goals: Patient/caregiver agrees to and verbalizes understanding of need to use nutritional supplements and/or vitamins as prescribed Date Initiated: 12/20/2016 Target Resolution Date: 04/08/2017 Goal Status: Active Interventions: Assess patient nutrition upon admission and as needed per policy Notes: ` Orientation to the [place], [place]. ([id number]) Nursing Diagnoses: Knowledge deficit related to the wound healing center program Goals: Patient/caregiver will verbalize understanding of the [facility] Date Initiated:  12/20/2016 Target Resolution Date: 01/07/2017 Goal Status: Active Interventions: Provide education on orientation to the wound center Notes: ` Pain, Acute or Chronic Nursing Diagnoses: Pain, acute or chronic: actual or potential Potential alteration in comfort, pain Goals: Patient/caregiver will verbalize adequate pain control between visits Date Initiated: 12/20/2016 Target Resolution Date: 04/08/2017 Goal Status: Active Interventions: Complete pain assessment as per visit requirements Notes: ` Wound/Skin Impairment Nursing Diagnoses: Impaired tissue integrity Knowledge deficit related to ulceration/compromised skin integrity Goals: Ulcer/skin breakdown will have a volume reduction of 80% by week 12 Date Initiated: 12/20/2016 Target Resolution Date: 04/01/2017 Goal Status: Active Interventions: Assess patient/caregiver ability to perform ulcer/skin care regimen upon admission and as needed Notes: BREEZIE, MICUCCI ([id number]) Electronic Signature(s) Signed: 01/18/2017 5:12:54 PM By: Alric Quan Entered By: Alric Quan on 01/18/2017 08:39:06 [name] ([id number]) -------------------------------------------------------------------------------- Pain Assessment Details [name] Date of Service: 01/18/2017 8:45 AM Patient Name: [name] Patient Account Number: [id number] Medical Record Treating RN: Ahmed Prima [id number] Number: Other Clinician: Date of Birth/Sex: 19-Jul-1920 (83 y.o. Female) Treating ROBSON, MICHAEL Primary Care Kyrese Gartman: Einar Pheasant Millicent Blazejewski/Extender: G Referring Mckaylee Dimalanta: Antony Salmon in Treatment: 4 Active Problems Location of Pain Severity and Description of Pain Patient Has Paino No Site Locations With  Dressing Change: No Pain Management and Medication Current Pain Management: Electronic Signature(s) Signed: 01/18/2017 5:12:54 PM By: Alric Quan Entered By: Alric Quan on 01/18/2017  96:78:93 [name] ([id number]) -------------------------------------------------------------------------------- Patient/Caregiver Education Details [name] Date of Service: 01/18/2017 8:45 AM Patient Name: [name] Patient Account Number: [id number] Medical Record Treating RN: Ahmed Prima [id number] Number: Other Clinician: Date of Birth/Gender: 01-Jun-1920 (83 y.o. Female) Treating ROBSON, MICHAEL Primary Care Physician/Extender: Myrtie Cruise Physician: Suella [place] in Treatment: 4 Referring Physician: Einar Pheasant Education Assessment Education Provided To: Patient Education Topics Provided Wound/Skin Impairment: Handouts: Other: do not get wrap wet Methods: Demonstration, Explain/Verbal Responses: State content correctly Electronic Signature(s) Signed: 01/18/2017 5:12:54 PM By: Alric Quan Entered By: Alric Quan on 01/18/2017 08:59:51 [name] ([id number]) -------------------------------------------------------------------------------- Wound Assessment Details [name] Date of Service: 01/18/2017 8:45 AM Patient Name: [name] Patient Account Number: [id number] Medical Record Treating RN: Ahmed Prima [id number] Number: Other Clinician: Date of Birth/Sex: [birth year] (83 y.o. Female) Treating ROBSON, MICHAEL Primary Care Maia Handa: Einar Pheasant Skii Cleland/Extender: G Referring Rodriquez Thorner: Antony Salmon in Treatment: 4 Wound Status Wound Number: 2 Primary Etiology: Venous Leg Ulcer Wound Location: Right Lower Leg - Lateral Secondary Trauma, Other Etiology: Wounding Event: Trauma Wound Status: Open Date Acquired: 12/06/2016 Comorbid History: Glaucoma, Sleep Apnea, Weeks Of Treatment: 4 Osteoarthritis Clustered Wound: No Photos Photo Uploaded By: Alric Quan on 01/18/2017 08:56:36 Wound Measurements Length: (cm) 0.5 Width: (cm) 0.4 Depth: (cm) 0.1 Area: (cm) 0.157 Volume: (cm) 0.016 % Reduction in  Area: 96.7% % Reduction in Volume: 96.6% Epithelialization: Medium (34-66%) Tunneling: No Undermining: No Wound Description Classification: Partial Thickness Foul Odor Aft Wound Margin: Distinct, outline attached Slough/Fibrin Exudate Amount: Large Exudate Type: Serosanguineous Exudate Color: red, brown er Cleansing: No o No Wound Bed Granulation Amount: Large (67-100%) Exposed Structure Granulation Quality: Red, Hyper-granulation Fascia Exposed: No Necrotic Amount: Small (1-33%) Fat Layer (Subcutaneous Tissue) Exposed: Yes TEDDY, PENA ([id number]) Necrotic Quality: Adherent Slough Tendon Exposed: No Muscle Exposed: No Joint Exposed: No Bone Exposed: No Periwound Skin Texture Texture Color No Abnormalities Noted: No No Abnormalities Noted: No Callus: No Atrophie Blanche: No Crepitus: No Cyanosis: No Excoriation: No Ecchymosis: Yes Induration: No Erythema: No Rash: No Hemosiderin Staining: No Scarring: No Mottled: No Pallor: No Moisture Rubor: No No Abnormalities Noted: No Dry / Scaly: No Temperature / Pain Maceration: No Temperature: No Abnormality Tenderness on Palpation: Yes Wound Preparation Ulcer Cleansing: Rinsed/Irrigated with Saline Topical Anesthetic Applied: Other: lidocaine 4%, Treatment Notes Wound #2 (Right, Lateral Lower Leg) 1. Cleansed with: Clean wound with Normal Saline Cleanse wound with antibacterial soap and water 2. Anesthetic Topical Lidocaine 4% cream to wound bed prior to debridement 4. Dressing Applied: Hydrogel Prisma Ag 5. Secondary Dressing Applied ABD Pad Non-Adherent pad 7. Secured with Tape Notes Kerlix and Event organiser) Signed: 01/18/2017 5:12:54 PM By: Alric Quan Entered By: Alric Quan on 01/18/2017 08:36:19 JAZEL, NIMMONS ([id number], BODIN ([id number]) -------------------------------------------------------------------------------- Vitals  Details [name] Date of Service: 01/18/2017 8:45 AM Patient Name: [name] Patient Account Number: [id number] Medical Record Treating RN: Ahmed Prima [id number] Number: Other Clinician: Date of Birth/Sex: [birth year] (83 y.o. Female) Treating ROBSON, [place] Primary Care Shalicia Craghead: Einar Pheasant Vallarie Fei/Extender: G Referring Renate Danh: Antony Salmon in Treatment: 4 Vital Signs Time Taken: 08:28 Temperature (F): 97.6 Height (in): 62 Pulse (bpm): 79 Weight (lbs): 102.9 Respiratory Rate (breaths/min): 18 Body Mass Index (BMI): 18.8 Blood Pressure (mmHg): 131/65 Reference Range: 80 - 120 mg / dl Electronic Signature(s) Signed: 01/18/2017 5:12:54  PM By: Alric Quan Entered By: Alric Quan on 01/18/2017 97:67:34

## 2017-01-25 ENCOUNTER — Encounter: Payer: Medicare Other | Admitting: Internal Medicine

## 2017-01-25 DIAGNOSIS — Z888 Allergy status to other drugs, medicaments and biological substances status: Secondary | ICD-10-CM | POA: Diagnosis not present

## 2017-01-25 DIAGNOSIS — Z881 Allergy status to other antibiotic agents status: Secondary | ICD-10-CM | POA: Diagnosis not present

## 2017-01-25 DIAGNOSIS — I87323 Chronic venous hypertension (idiopathic) with inflammation of bilateral lower extremity: Secondary | ICD-10-CM | POA: Diagnosis not present

## 2017-01-25 DIAGNOSIS — S81819A Laceration without foreign body, unspecified lower leg, initial encounter: Secondary | ICD-10-CM | POA: Diagnosis not present

## 2017-01-25 DIAGNOSIS — S81811D Laceration without foreign body, right lower leg, subsequent encounter: Secondary | ICD-10-CM | POA: Diagnosis not present

## 2017-01-25 DIAGNOSIS — I872 Venous insufficiency (chronic) (peripheral): Secondary | ICD-10-CM | POA: Diagnosis not present

## 2017-01-25 DIAGNOSIS — Z885 Allergy status to narcotic agent status: Secondary | ICD-10-CM | POA: Diagnosis not present

## 2017-01-25 DIAGNOSIS — H409 Unspecified glaucoma: Secondary | ICD-10-CM | POA: Diagnosis not present

## 2017-01-26 NOTE — Progress Notes (Signed)
Christy Cisneros, Christy Cisneros ([id number]) Visit Report for 01/25/2017 Arrival Information Details Christy Cisneros, Christy Cisneros Date of Service: 01/25/2017 8:45 AM Patient Name: M. Patient Account Number: [id number] Medical Record Treating RN: Ahmed Prima [id number] Number: Other Clinician: Date of Birth/Sex: November 04, 1919 (83 y.o. Female) Treating ROBSON, MICHAEL Primary Care Evert Wenrich: Einar Pheasant Lorriann Hansmann/Extender: G Referring Orlene Salmons: Antony Salmon in Treatment: 5 Visit Information History Since Last Visit All ordered tests and consults were completed: No Patient Arrived: Ambulatory Added or deleted any medications: No Arrival Time: 09:00 Any new allergies or adverse reactions: No Accompanied By: friend Had a fall or experienced change in No Transfer Assistance: None activities of daily living that may affect Patient Identification Verified: Yes risk of falls: Secondary Verification Process Yes Signs or symptoms of abuse/neglect since last No Completed: visito Patient Requires Transmission-Based No Hospitalized since last visit: No Precautions: Has Dressing in Place as Prescribed: Yes Patient Has Alerts: No Has Compression in Place as Prescribed: Yes Pain Present Now: No Electronic Signature(s) Signed: 01/25/2017 4:41:02 PM By: Alric Quan Entered By: Alric Quan on 01/25/2017 09:01:01 Christy Cisneros ([id number]) -------------------------------------------------------------------------------- Clinic Level of Care Assessment Details Christy Cisneros Date of Service: 01/25/2017 8:45 AM Patient Name: M. Patient Account Number: [id number] Medical Record Treating RN: Ahmed Prima [id number] Number: Other Clinician: Date of Birth/Sex: [birth year] (83 y.o. Female) Treating ROBSON, [place] Primary Care Kase Shughart: Einar Pheasant Dearion Huot/Extender: G Referring Winda Summerall: Antony Salmon in Treatment: 5 Clinic Level of Care Assessment Items TOOL 4 Quantity  Score X - Use when only an EandM is performed on FOLLOW-UP visit 1 0 ASSESSMENTS - Nursing Assessment / Reassessment X - Reassessment of Co-morbidities (includes updates in patient status) 1 10 X - Reassessment of Adherence to Treatment Plan 1 5 ASSESSMENTS - Wound and Skin Assessment / Reassessment X - Simple Wound Assessment / Reassessment - one wound 1 5 []  - Complex Wound Assessment / Reassessment - multiple wounds 0 []  - Dermatologic / Skin Assessment (not related to wound area) 0 ASSESSMENTS - Focused Assessment []  - Circumferential Edema Measurements - multi extremities 0 []  - Nutritional Assessment / Counseling / Intervention 0 []  - Lower Extremity Assessment (monofilament, tuning fork, pulses) 0 []  - Peripheral Arterial Disease Assessment (using hand held doppler) 0 ASSESSMENTS - Ostomy and/or Continence Assessment and Care []  - Incontinence Assessment and Management 0 []  - Ostomy Care Assessment and Management (repouching, etc.) 0 PROCESS - Coordination of Care X - Simple Patient / Family Education for ongoing care 1 15 []  - Complex (extensive) Patient / Family Education for ongoing care 0 []  - Staff obtains Programmer, systems, Records, Test Results / Process Orders 0 []  - Staff telephones HHA, Nursing Homes / Clarify orders / etc 0 Christy Cisneros, Christy Cisneros ([id number]) []  - Routine Transfer to another Facility (non-emergent condition) 0 []  - Routine Hospital Admission (non-emergent condition) 0 []  - New Admissions / Biomedical engineer / Ordering NPWT, Apligraf, etc. 0 []  - Emergency Hospital Admission (emergent condition) 0 X - Simple Discharge Coordination 1 10 []  - Complex (extensive) Discharge Coordination 0 PROCESS - Special Needs []  - Pediatric / Minor Patient Management 0 []  - Isolation Patient Management 0 []  - Hearing / Language / Visual special needs 0 []  - Assessment of Community assistance (transportation, D/C planning, etc.) 0 []  - Additional assistance / Altered  mentation 0 []  - Support Surface(s) Assessment (bed, cushion, seat, etc.) 0 INTERVENTIONS - Wound Cleansing / Measurement X - Simple Wound Cleansing - one wound 1 5 []  - Complex Wound Cleansing -  multiple wounds 0 X - Wound Imaging (photographs - any number of wounds) 1 5 []  - Wound Tracing (instead of photographs) 0 []  - Simple Wound Measurement - one wound 0 []  - Complex Wound Measurement - multiple wounds 0 INTERVENTIONS - Wound Dressings []  - Small Wound Dressing one or multiple wounds 0 []  - Medium Wound Dressing one or multiple wounds 0 []  - Large Wound Dressing one or multiple wounds 0 []  - Application of Medications - topical 0 []  - Application of Medications - injection 0 Christy Cisneros, Christy Cisneros ([id number]) INTERVENTIONS - Miscellaneous []  - External ear exam 0 []  - Specimen Collection (cultures, biopsies, blood, body fluids, etc.) 0 []  - Specimen(s) / Culture(s) sent or taken to Lab for analysis 0 []  - Patient Transfer (multiple staff / Harrel Lemon Lift / Similar devices) 0 []  - Simple Staple / Suture removal (25 or less) 0 []  - Complex Staple / Suture removal (26 or more) 0 []  - Hypo / Hyperglycemic Management (close monitor of Blood Glucose) 0 []  - Ankle / Brachial Index (ABI) - do not check if billed separately 0 X - Vital Signs 1 5 Has the patient been seen at the hospital within the last three years: Yes Total Score: 60 Level Of Care: New/Established - Level 2 Electronic Signature(s) Signed: 01/25/2017 4:41:02 PM By: Alric Quan Entered By: Alric Quan on 01/25/2017 13:09:03 Christy Cisneros ([id number]) -------------------------------------------------------------------------------- Encounter Discharge Information Details Christy Cisneros Date of Service: 01/25/2017 8:45 AM Patient Name: Christy Mages. Patient Account Number: [id number] Medical Record Treating RN: Ahmed Prima [id number] Number: Other Clinician: Date of Birth/Sex: 09-Aug-1919 (83 y.o. Female)  Treating ROBSON, MICHAEL Primary Care Esias Mory: Einar Pheasant Jamiah Homeyer/Extender: G Referring Gideon Burstein: Antony Salmon in Treatment: 5 Encounter Discharge Information Items Discharge Pain Level: 0 Discharge Condition: Stable Ambulatory Status: Ambulatory Discharge Destination: Home Transportation: Private Auto Accompanied By: friend Schedule Follow-up Appointment: No Medication Reconciliation completed and provided to Patient/Care No Beulah Matusek: Provided on Clinical Summary of Care: 01/25/2017 Form Type Recipient Paper Patient FO Electronic Signature(s) Signed: 01/25/2017 9:29:41 AM By: Ruthine Dose Entered By: Ruthine Dose on 01/25/2017 09:29:41 Christy Cisneros ([id number]) -------------------------------------------------------------------------------- Lower Extremity Assessment Details Christy Cisneros Date of Service: 01/25/2017 8:45 AM Patient Name: Christy Mages. Patient Account Number: [id number] Medical Record Treating RN: Ahmed Prima [id number] Number: Other Clinician: Date of Birth/Sex: 1919/11/27 (83 y.o. Female) Treating ROBSON, MICHAEL Primary Care Mack Alvidrez: Einar Pheasant Diandra Cimini/Extender: G Referring Ji Feldner: Antony Salmon in Treatment: 5 Vascular Assessment Pulses: Dorsalis Pedis Palpable: [Right:Yes] Posterior Tibial Extremity colors, hair growth, and conditions: Extremity Color: [Right:Hyperpigmented] Temperature of Extremity: [Right:Warm] Capillary Refill: [Right:< 3 seconds] Toe Nail Assessment Left: Right: Thick: No Discolored: No Deformed: No Improper Length and Hygiene: Yes Electronic Signature(s) Signed: 01/25/2017 4:41:02 PM By: Alric Quan Entered By: Alric Quan on 01/25/2017 09:18:13 Christy Cisneros ([id number]) -------------------------------------------------------------------------------- Multi Wound Chart Details Christy Cisneros Date of Service: 01/25/2017 8:45 AM Patient Name: Christy Mages. Patient Account  Number: [id number] Medical Record Treating RN: Ahmed Prima [id number] Number: Other Clinician: Date of Birth/Sex: 1920/01/14 (83 y.o. Female) Treating ROBSON, MICHAEL Primary Care Ignazio Kincaid: Einar Pheasant Oriana Horiuchi/Extender: G Referring Manju Kulkarni: Antony Salmon in Treatment: 5 Vital Signs Height(in): 62 Pulse(bpm): 82 Weight(lbs): 102.9 Blood Pressure 134/65 (mmHg): Body Mass Index(BMI): 19 Temperature(F): Respiratory Rate 18 (breaths/min): Photos: [2:No Photos] [N/A:N/A] Wound Location: [2:Right Lower Leg - Lateral] [N/A:N/A] Wounding Event: [2:Trauma] [N/A:N/A] Primary Etiology: [2:Venous Leg Ulcer] [N/A:N/A] Secondary Etiology: [2:Trauma, Other] [N/A:N/A] Comorbid History: [2:Glaucoma, Sleep Apnea, Osteoarthritis] [N/A:N/A] Date Acquired: [2:12/06/2016] [N/A:N/A] Weeks of  Treatment: [2:5] [N/A:N/A] Wound Status: [2:Open] [N/A:N/A] Measurements L x W x D 0x0x0 [N/A:N/A] (cm) Area (cm) : [2:0] [N/A:N/A] Volume (cm) : [2:0] [N/A:N/A] % Reduction in Area: [2:100.00%] [N/A:N/A] % Reduction in Volume: 100.00% [N/A:N/A] Classification: [2:Partial Thickness] [N/A:N/A] Exudate Amount: [2:None Present] [N/A:N/A] Wound Margin: [2:Distinct, outline attached] [N/A:N/A] Granulation Amount: [2:None Present (0%)] [N/A:N/A] Necrotic Amount: [2:None Present (0%)] [N/A:N/A] Exposed Structures: [2:Fat Layer (Subcutaneous Tissue) Exposed: Yes Fascia: No Tendon: No Muscle: No Joint: No Bone: No] [N/A:N/A] Epithelialization: Large (67-100%) N/A N/A Periwound Skin Texture: Excoriation: No N/A N/A Induration: No Callus: No Crepitus: No Rash: No Scarring: No Periwound Skin Maceration: No N/A N/A Moisture: Dry/Scaly: No Periwound Skin Color: Ecchymosis: Yes N/A N/A Atrophie Blanche: No Cyanosis: No Erythema: No Hemosiderin Staining: No Mottled: No Pallor: No Rubor: No Temperature: No Abnormality N/A N/A Tenderness on Yes N/A N/A Palpation: Wound Preparation: Ulcer  Cleansing: N/A N/A Rinsed/Irrigated with Saline Topical Anesthetic Applied: None Treatment Notes Electronic Signature(s) Signed: 01/25/2017 4:41:02 PM By: Alric Quan Entered By: Alric Quan on 01/25/2017 09:18:46 Christy Cisneros ([id number]) -------------------------------------------------------------------------------- [place] Details Christy Cisneros Date of Service: 01/25/2017 8:45 AM Patient Name: Christy Mages. Patient Account Number: [id number] Medical Record Treating RN: Ahmed Prima [id number] Number: Other Clinician: Date of Birth/Sex: 19-Dec-1919 (83 y.o. Female) Treating ROBSON, MICHAEL Primary Care Khyan Oats: Einar Pheasant Linden Mikes/Extender: G Referring Mechell Girgis: Antony Salmon in Treatment: 5 Active Inactive Electronic Signature(s) Signed: 01/25/2017 4:41:02 PM By: Alric Quan Entered By: Alric Quan on 01/25/2017 09:18:34 Christy Cisneros ([id number]) -------------------------------------------------------------------------------- Pain Assessment Details Christy Cisneros Date of Service: 01/25/2017 8:45 AM Patient Name: Christy Mages. Patient Account Number: [id number] Medical Record Treating RN: Ahmed Prima [id number] Number: Other Clinician: Date of Birth/Sex: [birth year] (83 y.o. Female) Treating ROBSON, MICHAEL Primary Care Titania Gault: Einar Pheasant Wynona Duhamel/Extender: G Referring Jamarkus Lisbon: Antony Salmon in Treatment: 5 Active Problems Location of Pain Severity and Description of Pain Patient Has Paino No Site Locations With Dressing Change: No Pain Management and Medication Current Pain Management: Electronic Signature(s) Signed: 01/25/2017 4:41:02 PM By: Alric Quan Entered By: Alric Quan on 01/25/2017 09:01:14 Christy Cisneros ([id number]) -------------------------------------------------------------------------------- Patient/Caregiver Education Details Christy Cisneros Date of  Service: 01/25/2017 8:45 AM Patient Name: Christy Mages. Patient Account Number: [id number] Medical Record Treating RN: Ahmed Prima [id number] Number: Other Clinician: Date of Birth/Gender: April 18, 1920 (83 y.o. Female) Treating ROBSON, MICHAEL Primary Care Physician/Extender: Myrtie Cruise Physician: Suella [place] in Treatment: 5 Referring Physician: Einar Pheasant Education Assessment Education Provided To: Patient Education Topics Provided Wound/Skin Impairment: Handouts: Other: Please wear your compression stockings everyday and take off at night. Methods: Explain/Verbal Responses: State content correctly Electronic Signature(s) Signed: 01/25/2017 4:41:02 PM By: Alric Quan Entered By: Alric Quan on 01/25/2017 09:20:39 Christy Cisneros, Christy Cisneros ([id number]) -------------------------------------------------------------------------------- Wound Assessment Details Christy Cisneros Date of Service: 01/25/2017 8:45 AM Patient Name: Christy Mages. Patient Account Number: [id number] Medical Record Treating RN: Ahmed Prima [id number] Number: Other Clinician: Date of Birth/Sex: [birth year] (83 y.o. Female) Treating ROBSON, MICHAEL Primary Care Luzmaria Devaux: Einar Pheasant Aydan Phoenix/Extender: G Referring Willian Donson: Antony Salmon in Treatment: 5 Wound Status Wound Number: 2 Primary Etiology: Venous Leg Ulcer Wound Location: Right Lower Leg - Lateral Secondary Trauma, Other Etiology: Wounding Event: Trauma Wound Status: Open Date Acquired: 12/06/2016 Comorbid History: Glaucoma, Sleep Apnea, Weeks Of Treatment: 5 Osteoarthritis Clustered Wound: No Photos Photo Uploaded By: Alric Quan on 01/25/2017 10:31:44 Wound Measurements Length: (cm) 0 % Reductio Width: (cm) 0 % Reductio Depth: (cm) 0 Epithelial Area: (cm) 0 Tunneling Volume: (cm) 0 Undermini n in  Area: 100% n in Volume: 100% ization: Large (67-100%) : No ng: No Wound Description Classification: Partial  Thickness Wound Margin: Distinct, outline attached Exudate Amount: None Present Foul Odor After Cleansing: No Slough/Fibrino No Wound Bed Granulation Amount: None Present (0%) Exposed Structure Necrotic Amount: None Present (0%) Fascia Exposed: No Fat Layer (Subcutaneous Tissue) Exposed: Yes Tendon Exposed: No Muscle Exposed: No Christy Cisneros, Christy Cisneros ([id number]) Joint Exposed: No Bone Exposed: No Periwound Skin Texture Texture Color No Abnormalities Noted: No No Abnormalities Noted: No Callus: No Atrophie Blanche: No Crepitus: No Cyanosis: No Excoriation: No Ecchymosis: Yes Induration: No Erythema: No Rash: No Hemosiderin Staining: No Scarring: No Mottled: No Pallor: No Moisture Rubor: No No Abnormalities Noted: No Dry / Scaly: No Temperature / Pain Maceration: No Temperature: No Abnormality Tenderness on Palpation: Yes Wound Preparation Ulcer Cleansing: Rinsed/Irrigated with Saline Topical Anesthetic Applied: None Electronic Signature(s) Signed: 01/25/2017 4:41:02 PM By: Alric Quan Entered By: Alric Quan on 01/25/2017 09:17:40 Christy Cisneros ([id number]) -------------------------------------------------------------------------------- Vitals Details Christy Cisneros Date of Service: 01/25/2017 8:45 AM Patient Name: Christy Mages. Patient Account Number: [id number] Medical Record Treating RN: Ahmed Prima [id number] Number: Other Clinician: Date of Birth/Sex: [birth year] (83 y.o. Female) Treating ROBSON, MICHAEL Primary Care Ariani Seier: Einar Pheasant Tawnee Clegg/Extender: G Referring Merve Hotard: Antony Salmon in Treatment: 5 Vital Signs Time Taken: 09:01 Pulse (bpm): 82 Height (in): 62 Respiratory Rate (breaths/min): 18 Weight (lbs): 102.9 Blood Pressure (mmHg): 134/65 Body Mass Index (BMI): 18.8 Reference Range: 80 - 120 mg / dl Electronic Signature(s) Signed: 01/25/2017 4:41:02 PM By: Alric Quan Entered By: Alric Quan on  01/25/2017 09:02:31

## 2017-01-27 NOTE — Progress Notes (Signed)
[name], [name] ([id number]) Visit Report for 01/25/2017 HPI Details [name], [name] Date of Service: 01/25/2017 8:45 AM Patient Name: M. Patient Account Number: [id number] Medical Record Treating RN: Ahmed Prima [id number] Number: Other Clinician: Date of Birth/Sex: [birth year] (83 y.o. Female) Treating Harjas Biggins Primary Care Provider: Einar Pheasant Provider/Extender: G Referring Provider: Antony Salmon in Treatment: 5 History of Present Illness HPI Description: Very pleasant 83 year old with no significant past medical history. No diabetes or peripheral vascular disease. She traumatized her right anterior calf on a car door on 07/08/2014. She is slowly improving with regular debridements, compression, and. No significant pain. No claudication or rest pain. Right ABI 1.02. She walks a mile a day. No drainage. No fever or chills. She returns to clinic today and says that the ulceration has healed. 12/20/16; This is a 83 year old women who traumatized her right leg against a table while adjusting a shade in her home. She suffered a skin tear. She was seen 2 weeks ago in her primary doctor's office and had a non stick dressing placed with kerlix and coban and that has apparently remained in place since then. She is complaining of pain but no systemic symptoms. I note she has been in this clinic 3 years ago for a similar presentation on the right leg.She was not seen by any of the current providers in our practice. She has compression stockings at home but does not use them. She has venous insufficiency. ABI's in this clinic 1.09 12/27/16; arrives today with the wound in better condition. Dimension smaller. We've been using Prisma 01/04/17; patient's wound on the right anterior calf looks a lot better this week dimension smaller base of the wound looks healthy. She has been using Prisma under 2 layer compression. She has chronic bilateral venous insufficiency and tells  me she has stockings at home that she has not been wearing 01/11/17; continued improvement in the right anterior lateral wound which was initially trauma in the setting of chronic venous insufficiency. We have been using Prisma under 2 layer compression 01/18/17; continued improvement in the right anterior leg wound which was initially traumatic in the setting of chronic venous insufficiency. We have been using Prisma under 2 layer compression. Nice improvement in terms of wound dimensions 01/25/17; right anterior leg wound which was initially trauma in the setting of chronic venous insufficiency. She does not have much in the way of edema however skin damage secondary to chronic hemosiderin deposition is quite apparent right greater than left. Her wound today is totally healed over. She brought in a support stocking. Electronic Signature(s) Signed: 01/25/2017 5:06:34 PM By: Linton Ham MD [name] ([id number]) Entered By: Linton Ham on 01/25/2017 10:51:59 [name] ([id number]) -------------------------------------------------------------------------------- Physical Exam Details [name] Date of Service: 01/25/2017 8:45 AM Patient Name: Christy Cisneros. Patient Account Number: [id number] Medical Record Treating RN: Ahmed Prima [id number] Number: Other Clinician: Date of Birth/Sex: 11/30/1919 (83 y.o. Female) Treating Shaneque Merkle Primary Care Provider: Einar Pheasant Provider/Extender: G Referring Provider: Antony Salmon in Treatment: 5 Constitutional Sitting or standing Blood Pressure is within target range for patient.. Pulse regular and within target range for patient.Marland Kitchen Respirations regular, non-labored and within target range.. Temperature is normal and within the target range for the patient.Marland Kitchen appears in no distress. Eyes Conjunctivae clear. No discharge. Respiratory Respiratory effort is easy and symmetric bilaterally. Rate is normal at  rest and on room air.. Cardiovascular Dorsalis pedis pulses palpable. She does not have significant edema in the lower  extremities however chronic skin damage likely secondary to a chronic venous insufficiency and possible actinic skin damage. Lymphatic None palpable in the right popliteal or inguinal area. Psychiatric No evidence of depression, anxiety, or agitation. Calm, cooperative, and communicative. Appropriate interactions and affect.. Notes Wound exam; her wound is totally epithelialized. Surface looks very healthy. No primary dressing will be required. Electronic Signature(s) Signed: 01/25/2017 5:06:34 PM By: Linton Ham MD Entered By: Linton Ham on 01/25/2017 10:53:30 [name] ([id number]) -------------------------------------------------------------------------------- Physician Orders Details [name] Date of Service: 01/25/2017 8:45 AM Patient Name: Christy Cisneros. Patient Account Number: [id number] Medical Record Treating RN: Ahmed Prima [id number] Number: Other Clinician: Date of Birth/Sex: 1920-04-24 (83 y.o. Female) Treating Karry Causer Primary Care Provider: Einar Pheasant Provider/Extender: G Referring Provider: Antony Salmon in Treatment: 5 Verbal / Phone Orders: Yes Clinician: Carolyne Fiscal, Debi Read Back and Verified: Yes Diagnosis Coding Discharge From [facility] o Discharge from [place] - Please wear your compression stockings everyday and take off at night. Keep legs clean and dry. Call our office if you have any questions or concerns. Electronic Signature(s) Signed: 01/25/2017 4:41:02 PM By: Alric Quan Signed: 01/25/2017 5:06:34 PM By: Linton Ham MD Entered By: Alric Quan on 01/25/2017 09:19:51 [name] ([id number]) -------------------------------------------------------------------------------- Problem List Details [name] Date of Service: 01/25/2017 8:45 AM Patient  Name: M. Patient Account Number: [id number] Medical Record Treating RN: Ahmed Prima [id number] Number: Other Clinician: Date of Birth/Sex: March 13, 1920 (83 y.o. Female) Treating Lavalle Skoda Primary Care Provider: Einar Pheasant Provider/Extender: G Referring Provider: Antony Salmon in Treatment: 5 Active Problems ICD-10 Encounter Code Description Active Date Diagnosis S81.811D Laceration without foreign body, right lower leg, 12/20/2016 Yes subsequent encounter I87.323 Chronic venous hypertension (idiopathic) with 12/20/2016 Yes inflammation of bilateral lower extremity Inactive Problems Resolved Problems Electronic Signature(s) Signed: 01/25/2017 4:41:02 PM By: Alric Quan Signed: 01/25/2017 5:06:34 PM By: Linton Ham MD Entered By: Alric Quan on 01/25/2017 13:09:16 [name] ([id number]) -------------------------------------------------------------------------------- Progress Note Details [name] Date of Service: 01/25/2017 8:45 AM Patient Name: Christy Cisneros. Patient Account Number: [id number] Medical Record Treating RN: Ahmed Prima [id number] Number: Other Clinician: Date of Birth/Sex: 1920/05/21 (83 y.o. Female) Treating Aissatou Fronczak Primary Care Provider: Einar Pheasant Provider/Extender: G Referring Provider: Antony Salmon in Treatment: 5 Subjective History of Present Illness (HPI) Very pleasant 83 year old with no significant past medical history. No diabetes or peripheral vascular disease. She traumatized her right anterior calf on a car door on 07/08/2014. She is slowly improving with regular debridements, compression, and. No significant pain. No claudication or rest pain. Right ABI 1.02. She walks a mile a day. No drainage. No fever or chills. She returns to clinic today and says that the ulceration has healed. 12/20/16; This is a 83 year old women who traumatized her right leg against a table while adjusting a  shade in her home. She suffered a skin tear. She was seen 2 weeks ago in her primary doctor's office and had a non stick dressing placed with kerlix and coban and that has apparently remained in place since then. She is complaining of pain but no systemic symptoms. I note she has been in this clinic 3 years ago for a similar presentation on the right leg.She was not seen by any of the current providers in our practice. She has compression stockings at home but does not use them. She has venous insufficiency. ABI's in this clinic 1.09 12/27/16; arrives today with the wound in better condition. Dimension smaller. We've  been using Prisma 01/04/17; patient's wound on the right anterior calf looks a lot better this week dimension smaller base of the wound looks healthy. She has been using Prisma under 2 layer compression. She has chronic bilateral venous insufficiency and tells me she has stockings at home that she has not been wearing 01/11/17; continued improvement in the right anterior lateral wound which was initially trauma in the setting of chronic venous insufficiency. We have been using Prisma under 2 layer compression 01/18/17; continued improvement in the right anterior leg wound which was initially traumatic in the setting of chronic venous insufficiency. We have been using Prisma under 2 layer compression. Nice improvement in terms of wound dimensions 01/25/17; right anterior leg wound which was initially trauma in the setting of chronic venous insufficiency. She does not have much in the way of edema however skin damage secondary to chronic hemosiderin deposition is quite apparent right greater than left. Her wound today is totally healed over. She brought in a support stocking. JANERA, PEUGH. ([id number]) Objective Constitutional Sitting or standing Blood Pressure is within target range for patient.. Pulse regular and within target range for patient.Marland Kitchen Respirations regular,  non-labored and within target range.. Temperature is normal and within the target range for the patient.Marland Kitchen appears in no distress. Vitals Time Taken: 9:01 AM, Height: 62 in, Weight: 102.9 lbs, BMI: 18.8, Pulse: 82 bpm, Respiratory Rate: 18 breaths/min, Blood Pressure: 134/65 mmHg. Eyes Conjunctivae clear. No discharge. Respiratory Respiratory effort is easy and symmetric bilaterally. Rate is normal at rest and on room air.. Cardiovascular Dorsalis pedis pulses palpable. She does not have significant edema in the lower extremities however chronic skin damage likely secondary to a chronic venous insufficiency and possible actinic skin damage. Lymphatic None palpable in the right popliteal or inguinal area. Psychiatric No evidence of depression, anxiety, or agitation. Calm, cooperative, and communicative. Appropriate interactions and affect.. General Notes: Wound exam; her wound is totally epithelialized. Surface looks very healthy. No primary dressing will be required. Integumentary (Hair, Skin) Wound #2 status is Open. Original cause of wound was Trauma. The wound is located on the Right,Lateral Lower Leg. The wound measures 0cm length x 0cm width x 0cm depth; 0cm^2 area and 0cm^3 volume. There is Fat Layer (Subcutaneous Tissue) Exposed exposed. There is no tunneling or undermining noted. There is a none present amount of drainage noted. The wound margin is distinct with the outline attached to the wound base. There is no granulation within the wound bed. There is no necrotic tissue within the wound bed. The periwound skin appearance exhibited: Ecchymosis. The periwound skin appearance did not exhibit: Callus, Crepitus, Excoriation, Induration, Rash, Scarring, Dry/Scaly, Maceration, Atrophie Blanche, Cyanosis, Hemosiderin Staining, Mottled, Pallor, Rubor, Erythema. Periwound temperature was noted as No Abnormality. The periwound has tenderness on palpation. KYNDEL, EGGER  ([id number]) Assessment Active Problems ICD-10 S81.811D - Laceration without foreign body, right lower leg, subsequent encounter I87.323 - Chronic venous hypertension (idiopathic) with inflammation of bilateral lower extremity Plan Discharge From [facility] Services: Discharge from [place] - Please wear your compression stockings everyday and take off at night. Keep legs clean and dry. Call our office if you have any questions or concerns. #1 discharge from the wound care center #2 we had a fairly long discussion about secondary prevention here. I don't think the patient will be able to put on formal compression stockings nor do I think she'll be compliant with them. We will use support hose for now. If she returns to  clinic it's likely she will need discussion about wraparound stockings. This was a trauma issue on this occasion. Electronic Signature(s) Signed: 01/25/2017 5:06:34 PM By: Linton Ham MD Entered By: Linton Ham on 01/25/2017 10:54:41 [name] ([id number]) -------------------------------------------------------------------------------- Tonye Pearson Date of Service: 01/25/2017 Patient Name: Christy Cisneros. Patient Account Number: [id number] Medical Record Treating RN: Ahmed Prima [id number] Number: Other Clinician: Date of Birth/Sex: 06-Nov-1919 (83 y.o. Female) Treating Chamia Schmutz Primary Care Provider: Einar Pheasant Provider/Extender: G Referring Provider: Antony Salmon in Treatment: 5 Diagnosis Coding ICD-10 Codes Code Description S81.811D Laceration without foreign body, right lower leg, subsequent encounter I87.323 Chronic venous hypertension (idiopathic) with inflammation of bilateral lower extremity Facility Procedures CPT4 Code: [id number] Description: [phone] - WOUND CARE VISIT-LEV 2 EST PT Modifier: Quantity: 1 Physician Procedures CPT4: Description Modifier Quantity Code [id number] [id number] - WC PHYS LEVEL 3 - EST  PT 1 ICD-10 Description Diagnosis S81.811D Laceration without foreign body, right lower leg, subsequent encounter I87.323 Chronic venous hypertension (idiopathic) with  inflammation of bilateral lower extremity Electronic Signature(s) Signed: 01/25/2017 4:41:02 PM By: Alric Quan Signed: 01/25/2017 5:06:34 PM By: Linton Ham MD Entered By: Alric Quan on 01/25/2017 13:09:11

## 2017-02-06 ENCOUNTER — Encounter: Payer: Self-pay | Admitting: Internal Medicine

## 2017-02-06 ENCOUNTER — Ambulatory Visit (INDEPENDENT_AMBULATORY_CARE_PROVIDER_SITE_OTHER): Payer: Medicare Other | Admitting: Internal Medicine

## 2017-02-06 VITALS — BP 130/68 | HR 83 | Temp 97.9°F | Resp 12 | Ht 62.0 in | Wt 104.2 lb

## 2017-02-06 DIAGNOSIS — M799 Soft tissue disorder, unspecified: Secondary | ICD-10-CM | POA: Diagnosis not present

## 2017-02-06 DIAGNOSIS — K2100 Gastro-esophageal reflux disease with esophagitis, without bleeding: Secondary | ICD-10-CM

## 2017-02-06 DIAGNOSIS — R413 Other amnesia: Secondary | ICD-10-CM | POA: Diagnosis not present

## 2017-02-06 DIAGNOSIS — K2270 Barrett's esophagus without dysplasia: Secondary | ICD-10-CM | POA: Diagnosis not present

## 2017-02-06 DIAGNOSIS — E7800 Pure hypercholesterolemia, unspecified: Secondary | ICD-10-CM | POA: Diagnosis not present

## 2017-02-06 DIAGNOSIS — M7989 Other specified soft tissue disorders: Secondary | ICD-10-CM

## 2017-02-06 NOTE — Progress Notes (Signed)
Pre-visit discussion using our clinic review tool. No additional management support is needed unless otherwise documented below in the visit note.  

## 2017-02-06 NOTE — Progress Notes (Signed)
Patient ID: Christy Cisneros, female   DOB: 1920/04/24, 83 y.o.   MRN: [id number]   Subjective:    Patient ID: Christy Cisneros, female    DOB: Jan 19, 1920, 83 y.o.   MRN: [id number]  HPI  Patient here for a scheduled follow up.  She reports she is doing relatively well.  Has been having some concerns regarding her memory.  She saw Dr Melrose Nakayama.  Did not feel further w/up warranted.  Note reviewed.  She reports that she does not drive.  Lives by herself.  We discussed safety issues.  She reports she cooks her own meals.  Continues to exercise.  Walks with a friend.  No chest pain.  No sob.  No swallowing problems.  No increased acid reflux reported.  No abdominal pain.  Bowels moving.  She does report soft tissue mass - right popliteal region.  Concerned.  Would like checked.  No pain.     Past Medical History:  Diagnosis Date  . Barrett's esophagus with esophagitis   . Chronic bronchitis ([place])   . GERD (gastroesophageal reflux disease)   . Glaucoma   . Hypercholesteremia   . Osteoarthritis   . Osteoporosis   . Sleep apnea    has CPAP  . TIA (transient ischemic attack)    Past Surgical History:  Procedure Laterality Date  . BUNIONECTOMY     left  . ROTATOR CUFF REPAIR     right   Family History  Problem Relation Age of Onset  . Breast cancer Sister   . Heart disease Mother    Social History   Social History  . Marital status: Single    Spouse name: N/A  . Number of children: N/A  . Years of education: N/A   Social History Main Topics  . Smoking status: Never Smoker  . Smokeless tobacco: Never Used  . Alcohol use 0.0 oz/week     Comment: wine/brandy occasional  . Drug use: No  . Sexual activity: No   Other Topics Concern  . None   Social History Narrative  . None    Outpatient Encounter Prescriptions as of 02/06/2017  Medication Sig  . Aloe LIQD Take by mouth daily.   Marland Kitchen aspirin 81 MG tablet Take 81 mg by mouth daily.  . Bilberry, Vaccinium myrtillus,  (BILBERRY PO) Take 1 capsule by mouth daily.  . calcium carbonate (OS-CAL) 600 MG TABS Take 600 mg by mouth daily.  Marland Kitchen doxycycline (VIBRA-TABS) 100 MG tablet Take 1 tablet (100 mg total) by mouth 2 (two) times daily.  . Flaxseed, Linseed, (GROUND FLAX SEEDS PO) Take by mouth daily.   . fluticasone (FLONASE) 50 MCG/ACT nasal spray Place 2 sprays into both nostrils daily.  . Multiple Vitamins-Minerals (PRESERVISION AREDS 2 PO) Take by mouth 2 (two) times daily.  Marland Kitchen omeprazole (PRILOSEC) 20 MG capsule TAKE ONE CAPSULE TWICE A DAY BEFORE MEALS  . ondansetron (ZOFRAN) 4 MG tablet Take 1 tablet (4 mg total) by mouth daily as needed for nausea or vomiting.  . prednisoLONE acetate (PRED FORTE) 1 % ophthalmic suspension Place 1 drop into the left eye 2 (two) times daily.  . timolol (BETIMOL) 0.5 % ophthalmic solution Place 1 drop into the left eye daily.   Marland Kitchen tobramycin (TOBREX) 0.3 % ophthalmic solution    No facility-administered encounter medications on file as of 02/06/2017.     Review of Systems  Constitutional: Negative for appetite change and unexpected weight change.  HENT: Negative for congestion and sinus  pressure.   Respiratory: Negative for cough, chest tightness and shortness of breath.   Cardiovascular: Negative for chest pain, palpitations and leg swelling.  Gastrointestinal: Negative for abdominal pain, diarrhea, nausea and vomiting.  Genitourinary: Negative for difficulty urinating and dysuria.  Musculoskeletal: Negative for back pain and joint swelling.  Skin: Negative for color change and rash.  Neurological: Negative for dizziness, light-headedness and headaches.  Psychiatric/Behavioral: Negative for agitation and dysphoric mood.       Memory change as outlined.         Objective:    Physical Exam  Constitutional: She appears well-developed and well-nourished. No distress.  HENT:  Nose: Nose normal.  Mouth/Throat: Oropharynx is clear and moist.  Neck: Neck supple. No  thyromegaly present.  Cardiovascular: Normal rate and regular rhythm.   Pulmonary/Chest: Breath sounds normal. No respiratory distress. She has no wheezes.  Abdominal: Soft. Bowel sounds are normal. There is no tenderness.  Musculoskeletal: She exhibits no edema or tenderness.  Increased fullness - right popliteal region.  Question of Baker's cyst.  Non tender.  No increased erythema.    Lymphadenopathy:    She has no cervical adenopathy.  Skin: No rash noted. No erythema.  Psychiatric: She has a normal mood and affect. Her behavior is normal.    BP 130/68 (BP Location: Left Arm, Patient Position: Sitting, Cuff Size: Normal)   Pulse 83   Temp 97.9 F (36.6 C) (Oral)   Resp 12   Ht 5\' 2"  (1.575 m)   Wt 104 lb 3.2 oz (47.3 kg)   SpO2 98%   BMI 19.06 kg/m  Wt Readings from Last 3 Encounters:  02/06/17 104 lb 3.2 oz (47.3 kg)  11/24/16 104 lb 9.6 oz (47.4 kg)  10/14/16 105 lb (47.6 kg)     Lab Results  Component Value Date   WBC 7.7 10/14/2016   HGB 14.0 10/14/2016   HCT 40.5 10/14/2016   PLT 244 10/14/2016   GLUCOSE 94 10/14/2016   CHOL 220 (H) 06/16/2015   TRIG 64.0 06/16/2015   HDL 82.20 06/16/2015   LDLCALC 125 (H) 06/16/2015   ALT 20 10/14/2016   AST 26 10/14/2016   NA 139 10/14/2016   K 3.6 10/14/2016   CL 106 10/14/2016   CREATININE 0.93 10/14/2016   BUN 17 10/14/2016   CO2 27 10/14/2016   TSH 1.544 09/15/2016   INR 0.97 10/14/2016    Ct Abdomen Pelvis W Contrast  Result Date: 10/14/2016 CLINICAL DATA:  Persistent diarrhea and lower abdominal pain. Vomiting. EXAM: CT ABDOMEN AND PELVIS WITH CONTRAST TECHNIQUE: Multidetector CT imaging of the abdomen and pelvis was performed using the standard protocol following bolus administration of intravenous contrast. CONTRAST:  31mL ISOVUE-300 IOPAMIDOL (ISOVUE-300) INJECTION 61% COMPARISON:  None. FINDINGS: Lower chest: Atelectasis or infiltration in the lung bases. Cardiac enlargement. Coronary artery calcification.  Small esophageal hiatal hernia. Hepatobiliary: No focal liver abnormality is seen. No gallstones, gallbladder wall thickening, or biliary dilatation. Pancreas: Unremarkable. No pancreatic ductal dilatation or surrounding inflammatory changes. Spleen: Normal in size without focal abnormality. Adrenals/Urinary Tract: Adrenal glands are unremarkable. Kidneys are normal, without renal calculi, focal lesion, or hydronephrosis. Bladder is unremarkable. Stomach/Bowel: Stomach, small bowel, and colon are not abnormally distended. Some small bowel loops demonstrate wall thickening suggesting enteritis or possibly inflammatory bowel disease. Diverticulosis of the sigmoid colon. No definite evidence of diverticulitis. Vascular/Lymphatic: Aortic atherosclerosis. No enlarged abdominal or pelvic lymph nodes. Reproductive: Status post hysterectomy. No adnexal masses. Other: No abdominal wall hernia or  abnormality. No abdominopelvic ascites. Musculoskeletal: Scoliosis and degenerative changes of the lumbar spine. Degenerative changes in the hips. No destructive bone lesions. IMPRESSION: Areas of small bowel wall thickening suggesting enteritis or inflammatory bowel disease. Diverticulosis of the colon without evidence of diverticulitis. Electronically Signed   By: Lucienne Capers M.D.   On: 10/14/2016 06:28       Assessment & Plan:   Problem List Items Addressed This Visit    BARRETTS ESOPHAGUS    Has declined f/u EGD.  Continues on prilosec.        GERD (gastroesophageal reflux disease)    Has declined f/u EGD.  Continue omeprazole.        HYPERCHOLESTEROLEMIA    Follow lipid panel.        Memory change    Saw neurology.  Note reviewed.  Did not feel any further w/up warranted.  Discussed with her today.  Lives by herself, but lives in a complex with close neighbors.  Does not drive.  Cooks her own meals.  Will have neurology follow.         Other Visit Diagnoses    Soft tissue mass    -  Primary   Noted  - right popliteal region.  Non tender.  No redness.  Check ultrasound.     Relevant Orders   [place] RT LOWER EXTREM LTD SOFT TISSUE NON VASCULAR       Einar Pheasant, MD

## 2017-02-07 ENCOUNTER — Telehealth: Payer: Self-pay | Admitting: *Deleted

## 2017-02-07 ENCOUNTER — Other Ambulatory Visit: Payer: Self-pay | Admitting: Internal Medicine

## 2017-02-07 ENCOUNTER — Other Ambulatory Visit: Payer: Medicare Other

## 2017-02-07 DIAGNOSIS — R351 Nocturia: Secondary | ICD-10-CM

## 2017-02-07 NOTE — Telephone Encounter (Signed)
Spoke to pt to let her know about her medication question regarding aleve or tylenol and scheduled her 2 month follow up. She stated that she is having a hard time controlling her bladder, especially when she gets up at night to use the rest room. She was asking if she needed to bring in a urine sample. Pt cb [phone]

## 2017-02-07 NOTE — Addendum Note (Signed)
Addended by: Leeanne Rio on: 02/07/2017 04:02 PM   Modules accepted: Orders

## 2017-02-07 NOTE — Telephone Encounter (Signed)
Order already placed

## 2017-02-07 NOTE — Progress Notes (Signed)
Pt walked in today very confused & lost. Nobody came to lab with her. I found patient near the back hallway & hard a hard time convincing her to come with me to the lab. AFter getting a urine from her she wanted to go left when I tried to direct her to go right & she stated that she was getting very upset. Pt was obviously not herself and needed someone with her. I walked her into the waiting room & the front desk told me the person with her went back to her car. So I walked her out to the car. Urinalysis & Culture will be sent off to [facility] this evening.   Addendum:  She is back home now.  Answering questions when asked.  Knew her ultrasound was scheduled for Thursday.  Denies any pain or fever.  Eating.  No urinary symptoms other than increased frequency.  Discussed her memory and confusion.  States she has noticed more problems recently with memory.  Discussed referral back to neurology given concerns regarding increased confusion.  Pt in agreement.  Await urine culture results.  Will notify me if problems.

## 2017-02-07 NOTE — Telephone Encounter (Signed)
If this is a new symptom for her then ok for her to bring urine sample.  I will place the orders.

## 2017-02-07 NOTE — Telephone Encounter (Signed)
Spoke with the patient she is going to come by and give a specimen. thanks

## 2017-02-07 NOTE — Progress Notes (Signed)
Order placed for urine check 

## 2017-02-07 NOTE — Telephone Encounter (Signed)
Patient has requested ro have a urine specimen, pt reported frequent urination through out the night  Pt contact [phone]

## 2017-02-07 NOTE — Telephone Encounter (Signed)
This patient was seen yesterday in the Office, please advise if you would like her to return to give a urine?

## 2017-02-08 ENCOUNTER — Encounter: Payer: Self-pay | Admitting: Internal Medicine

## 2017-02-08 DIAGNOSIS — H401113 Primary open-angle glaucoma, right eye, severe stage: Secondary | ICD-10-CM | POA: Diagnosis not present

## 2017-02-08 DIAGNOSIS — S0502XA Injury of conjunctiva and corneal abrasion without foreign body, left eye, initial encounter: Secondary | ICD-10-CM | POA: Diagnosis not present

## 2017-02-08 DIAGNOSIS — H18832 Recurrent erosion of cornea, left eye: Secondary | ICD-10-CM | POA: Diagnosis not present

## 2017-02-08 DIAGNOSIS — H52221 Regular astigmatism, right eye: Secondary | ICD-10-CM | POA: Diagnosis not present

## 2017-02-08 DIAGNOSIS — H47233 Glaucomatous optic atrophy, bilateral: Secondary | ICD-10-CM | POA: Diagnosis not present

## 2017-02-08 DIAGNOSIS — H401123 Primary open-angle glaucoma, left eye, severe stage: Secondary | ICD-10-CM | POA: Diagnosis not present

## 2017-02-08 DIAGNOSIS — H353131 Nonexudative age-related macular degeneration, bilateral, early dry stage: Secondary | ICD-10-CM | POA: Diagnosis not present

## 2017-02-08 DIAGNOSIS — H04123 Dry eye syndrome of bilateral lacrimal glands: Secondary | ICD-10-CM | POA: Diagnosis not present

## 2017-02-08 DIAGNOSIS — H524 Presbyopia: Secondary | ICD-10-CM | POA: Diagnosis not present

## 2017-02-08 DIAGNOSIS — H5211 Myopia, right eye: Secondary | ICD-10-CM | POA: Diagnosis not present

## 2017-02-08 DIAGNOSIS — H35363 Drusen (degenerative) of macula, bilateral: Secondary | ICD-10-CM | POA: Diagnosis not present

## 2017-02-08 DIAGNOSIS — H1820 Unspecified corneal edema: Secondary | ICD-10-CM | POA: Diagnosis not present

## 2017-02-08 LAB — URINALYSIS, ROUTINE W REFLEX MICROSCOPIC
Bilirubin Urine: NEGATIVE
GLUCOSE, UA: NEGATIVE
Hgb urine dipstick: NEGATIVE
Ketones, ur: NEGATIVE
LEUKOCYTES UA: NEGATIVE
Nitrite: NEGATIVE
Specific Gravity, Urine: 1.025 (ref 1.001–1.035)
pH: 5.5 (ref 5.0–8.0)

## 2017-02-08 LAB — URINE CULTURE: Organism ID, Bacteria: NO GROWTH

## 2017-02-08 LAB — URINALYSIS, MICROSCOPIC ONLY
BACTERIA UA: NONE SEEN [HPF]
Casts: NONE SEEN [LPF]
Crystals: NONE SEEN [HPF]
RBC / HPF: NONE SEEN RBC/HPF (ref <=2)
SQUAMOUS EPITHELIAL / LPF: NONE SEEN [HPF] (ref <=5)
YEAST: NONE SEEN [HPF]

## 2017-02-08 NOTE — Assessment & Plan Note (Signed)
Has declined f/u EGD.  Continue omeprazole.

## 2017-02-08 NOTE — Assessment & Plan Note (Signed)
Saw neurology.  Note reviewed.  Did not feel any further w/up warranted.  Discussed with her today.  Lives by herself, but lives in a complex with close neighbors.  Does not drive.  Cooks her own meals.  Will have neurology follow.

## 2017-02-08 NOTE — Assessment & Plan Note (Signed)
Follow lipid panel.   

## 2017-02-08 NOTE — Assessment & Plan Note (Signed)
Has declined f/u EGD.  Continues on prilosec.

## 2017-02-09 ENCOUNTER — Ambulatory Visit
Admission: RE | Admit: 2017-02-09 | Discharge: 2017-02-09 | Disposition: A | Discharge status: Home / Self Care | Payer: Medicare Other | Source: Ambulatory Visit | Attending: Internal Medicine | Admitting: Internal Medicine

## 2017-02-09 DIAGNOSIS — M799 Soft tissue disorder, unspecified: Secondary | ICD-10-CM | POA: Insufficient documentation

## 2017-02-09 DIAGNOSIS — M7989 Other specified soft tissue disorders: Secondary | ICD-10-CM

## 2017-02-13 DIAGNOSIS — B078 Other viral warts: Secondary | ICD-10-CM | POA: Diagnosis not present

## 2017-02-13 DIAGNOSIS — L538 Other specified erythematous conditions: Secondary | ICD-10-CM | POA: Diagnosis not present

## 2017-02-13 DIAGNOSIS — D1801 Hemangioma of skin and subcutaneous tissue: Secondary | ICD-10-CM | POA: Diagnosis not present

## 2017-02-13 DIAGNOSIS — L0889 Other specified local infections of the skin and subcutaneous tissue: Secondary | ICD-10-CM | POA: Diagnosis not present

## 2017-02-13 DIAGNOSIS — L570 Actinic keratosis: Secondary | ICD-10-CM | POA: Diagnosis not present

## 2017-02-13 DIAGNOSIS — X32XXXA Exposure to sunlight, initial encounter: Secondary | ICD-10-CM | POA: Diagnosis not present

## 2017-02-13 DIAGNOSIS — L821 Other seborrheic keratosis: Secondary | ICD-10-CM | POA: Diagnosis not present

## 2017-02-14 DIAGNOSIS — H5211 Myopia, right eye: Secondary | ICD-10-CM | POA: Diagnosis not present

## 2017-02-14 DIAGNOSIS — H401123 Primary open-angle glaucoma, left eye, severe stage: Secondary | ICD-10-CM | POA: Diagnosis not present

## 2017-02-14 DIAGNOSIS — S0502XD Injury of conjunctiva and corneal abrasion without foreign body, left eye, subsequent encounter: Secondary | ICD-10-CM | POA: Diagnosis not present

## 2017-02-14 DIAGNOSIS — H18832 Recurrent erosion of cornea, left eye: Secondary | ICD-10-CM | POA: Diagnosis not present

## 2017-02-14 DIAGNOSIS — H04123 Dry eye syndrome of bilateral lacrimal glands: Secondary | ICD-10-CM | POA: Diagnosis not present

## 2017-02-14 DIAGNOSIS — H524 Presbyopia: Secondary | ICD-10-CM | POA: Diagnosis not present

## 2017-02-14 DIAGNOSIS — H1820 Unspecified corneal edema: Secondary | ICD-10-CM | POA: Diagnosis not present

## 2017-02-14 DIAGNOSIS — H35363 Drusen (degenerative) of macula, bilateral: Secondary | ICD-10-CM | POA: Diagnosis not present

## 2017-02-14 DIAGNOSIS — H47233 Glaucomatous optic atrophy, bilateral: Secondary | ICD-10-CM | POA: Diagnosis not present

## 2017-02-14 DIAGNOSIS — H353131 Nonexudative age-related macular degeneration, bilateral, early dry stage: Secondary | ICD-10-CM | POA: Diagnosis not present

## 2017-02-14 DIAGNOSIS — H52221 Regular astigmatism, right eye: Secondary | ICD-10-CM | POA: Diagnosis not present

## 2017-02-14 DIAGNOSIS — H401113 Primary open-angle glaucoma, right eye, severe stage: Secondary | ICD-10-CM | POA: Diagnosis not present

## 2017-02-21 DIAGNOSIS — H04123 Dry eye syndrome of bilateral lacrimal glands: Secondary | ICD-10-CM | POA: Diagnosis not present

## 2017-02-21 DIAGNOSIS — H47233 Glaucomatous optic atrophy, bilateral: Secondary | ICD-10-CM | POA: Diagnosis not present

## 2017-02-21 DIAGNOSIS — H18832 Recurrent erosion of cornea, left eye: Secondary | ICD-10-CM | POA: Diagnosis not present

## 2017-02-21 DIAGNOSIS — H401123 Primary open-angle glaucoma, left eye, severe stage: Secondary | ICD-10-CM | POA: Diagnosis not present

## 2017-02-21 DIAGNOSIS — S0502XD Injury of conjunctiva and corneal abrasion without foreign body, left eye, subsequent encounter: Secondary | ICD-10-CM | POA: Diagnosis not present

## 2017-02-21 DIAGNOSIS — H353131 Nonexudative age-related macular degeneration, bilateral, early dry stage: Secondary | ICD-10-CM | POA: Diagnosis not present

## 2017-02-21 DIAGNOSIS — H5211 Myopia, right eye: Secondary | ICD-10-CM | POA: Diagnosis not present

## 2017-02-21 DIAGNOSIS — H401113 Primary open-angle glaucoma, right eye, severe stage: Secondary | ICD-10-CM | POA: Diagnosis not present

## 2017-02-21 DIAGNOSIS — H35363 Drusen (degenerative) of macula, bilateral: Secondary | ICD-10-CM | POA: Diagnosis not present

## 2017-02-21 DIAGNOSIS — H524 Presbyopia: Secondary | ICD-10-CM | POA: Diagnosis not present

## 2017-02-21 DIAGNOSIS — H1820 Unspecified corneal edema: Secondary | ICD-10-CM | POA: Diagnosis not present

## 2017-02-21 DIAGNOSIS — H52221 Regular astigmatism, right eye: Secondary | ICD-10-CM | POA: Diagnosis not present

## 2017-03-06 DIAGNOSIS — M79675 Pain in left toe(s): Secondary | ICD-10-CM | POA: Diagnosis not present

## 2017-03-06 DIAGNOSIS — M79674 Pain in right toe(s): Secondary | ICD-10-CM | POA: Diagnosis not present

## 2017-03-06 DIAGNOSIS — B351 Tinea unguium: Secondary | ICD-10-CM | POA: Diagnosis not present

## 2017-03-08 DIAGNOSIS — H903 Sensorineural hearing loss, bilateral: Secondary | ICD-10-CM | POA: Diagnosis not present

## 2017-03-08 DIAGNOSIS — H6123 Impacted cerumen, bilateral: Secondary | ICD-10-CM | POA: Diagnosis not present

## 2017-03-16 ENCOUNTER — Encounter: Payer: Self-pay | Admitting: Emergency Medicine

## 2017-03-16 ENCOUNTER — Inpatient Hospital Stay: Payer: Medicare Other

## 2017-03-16 ENCOUNTER — Observation Stay
Admission: EM | Admit: 2017-03-16 | Discharge: 2017-03-17 | Disposition: A | Discharge status: Home / Self Care | Payer: Medicare Other | Attending: Internal Medicine | Admitting: Internal Medicine

## 2017-03-16 ENCOUNTER — Emergency Department: Payer: Medicare Other

## 2017-03-16 DIAGNOSIS — G4733 Obstructive sleep apnea (adult) (pediatric): Secondary | ICD-10-CM | POA: Diagnosis not present

## 2017-03-16 DIAGNOSIS — I7090 Unspecified atherosclerosis: Secondary | ICD-10-CM

## 2017-03-16 DIAGNOSIS — K219 Gastro-esophageal reflux disease without esophagitis: Secondary | ICD-10-CM | POA: Diagnosis not present

## 2017-03-16 DIAGNOSIS — M810 Age-related osteoporosis without current pathological fracture: Secondary | ICD-10-CM | POA: Insufficient documentation

## 2017-03-16 DIAGNOSIS — H1820 Unspecified corneal edema: Secondary | ICD-10-CM | POA: Diagnosis not present

## 2017-03-16 DIAGNOSIS — Z7982 Long term (current) use of aspirin: Secondary | ICD-10-CM | POA: Insufficient documentation

## 2017-03-16 DIAGNOSIS — M1990 Unspecified osteoarthritis, unspecified site: Secondary | ICD-10-CM | POA: Insufficient documentation

## 2017-03-16 DIAGNOSIS — H353131 Nonexudative age-related macular degeneration, bilateral, early dry stage: Secondary | ICD-10-CM | POA: Diagnosis not present

## 2017-03-16 DIAGNOSIS — H401123 Primary open-angle glaucoma, left eye, severe stage: Secondary | ICD-10-CM | POA: Diagnosis not present

## 2017-03-16 DIAGNOSIS — I639 Cerebral infarction, unspecified: Secondary | ICD-10-CM | POA: Diagnosis not present

## 2017-03-16 DIAGNOSIS — H409 Unspecified glaucoma: Secondary | ICD-10-CM | POA: Insufficient documentation

## 2017-03-16 DIAGNOSIS — H18832 Recurrent erosion of cornea, left eye: Secondary | ICD-10-CM | POA: Diagnosis not present

## 2017-03-16 DIAGNOSIS — Z8673 Personal history of transient ischemic attack (TIA), and cerebral infarction without residual deficits: Secondary | ICD-10-CM | POA: Diagnosis not present

## 2017-03-16 DIAGNOSIS — H524 Presbyopia: Secondary | ICD-10-CM | POA: Diagnosis not present

## 2017-03-16 DIAGNOSIS — H538 Other visual disturbances: Secondary | ICD-10-CM | POA: Diagnosis not present

## 2017-03-16 DIAGNOSIS — I6789 Other cerebrovascular disease: Secondary | ICD-10-CM | POA: Diagnosis not present

## 2017-03-16 DIAGNOSIS — Z888 Allergy status to other drugs, medicaments and biological substances status: Secondary | ICD-10-CM | POA: Insufficient documentation

## 2017-03-16 DIAGNOSIS — Z79899 Other long term (current) drug therapy: Secondary | ICD-10-CM | POA: Diagnosis not present

## 2017-03-16 DIAGNOSIS — G453 Amaurosis fugax: Secondary | ICD-10-CM | POA: Diagnosis not present

## 2017-03-16 DIAGNOSIS — H04123 Dry eye syndrome of bilateral lacrimal glands: Secondary | ICD-10-CM | POA: Diagnosis not present

## 2017-03-16 DIAGNOSIS — Z881 Allergy status to other antibiotic agents status: Secondary | ICD-10-CM | POA: Diagnosis not present

## 2017-03-16 DIAGNOSIS — H547 Unspecified visual loss: Secondary | ICD-10-CM | POA: Diagnosis not present

## 2017-03-16 DIAGNOSIS — H5211 Myopia, right eye: Secondary | ICD-10-CM | POA: Diagnosis not present

## 2017-03-16 DIAGNOSIS — S0502XD Injury of conjunctiva and corneal abrasion without foreign body, left eye, subsequent encounter: Secondary | ICD-10-CM | POA: Diagnosis not present

## 2017-03-16 DIAGNOSIS — H52221 Regular astigmatism, right eye: Secondary | ICD-10-CM | POA: Diagnosis not present

## 2017-03-16 DIAGNOSIS — H47233 Glaucomatous optic atrophy, bilateral: Secondary | ICD-10-CM | POA: Diagnosis not present

## 2017-03-16 DIAGNOSIS — H53121 Transient visual loss, right eye: Secondary | ICD-10-CM | POA: Diagnosis not present

## 2017-03-16 DIAGNOSIS — H401113 Primary open-angle glaucoma, right eye, severe stage: Secondary | ICD-10-CM | POA: Diagnosis not present

## 2017-03-16 LAB — COMPREHENSIVE METABOLIC PANEL
ALT: 18 U/L (ref 14–54)
AST: 28 U/L (ref 15–41)
Albumin: 4.1 g/dL (ref 3.5–5.0)
Alkaline Phosphatase: 51 U/L (ref 38–126)
Anion gap: 8 (ref 5–15)
BUN: 21 mg/dL — AB (ref 6–20)
CHLORIDE: 109 mmol/L (ref 101–111)
CO2: 23 mmol/L (ref 22–32)
CREATININE: 0.71 mg/dL (ref 0.44–1.00)
Calcium: 9.4 mg/dL (ref 8.9–10.3)
GFR calc Af Amer: >60 mL/min (ref >60)
GFR calc non Af Amer: >60 mL/min (ref >60)
Glucose, Bld: 128 mg/dL — ABNORMAL HIGH (ref 65–99)
Potassium: 4.3 mmol/L (ref 3.5–5.1)
SODIUM: 140 mmol/L (ref 135–145)
Total Bilirubin: 0.7 mg/dL (ref 0.3–1.2)
Total Protein: 6.7 g/dL (ref 6.5–8.1)

## 2017-03-16 LAB — DIFFERENTIAL
BASOS ABS: 0.1 10*3/uL (ref 0–0.1)
BASOS PCT: 1 %
EOS ABS: 0.1 10*3/uL (ref 0–0.7)
Eosinophils Relative: 1 %
Lymphocytes Relative: 13 %
Lymphs Abs: 1 10*3/uL (ref 1.0–3.6)
Monocytes Absolute: 0.5 10*3/uL (ref 0.2–0.9)
Monocytes Relative: 7 %
NEUTROS ABS: 6 10*3/uL (ref 1.4–6.5)
NEUTROS PCT: 78 %

## 2017-03-16 LAB — CBC
HCT: 40.6 % (ref 35.0–47.0)
Hemoglobin: 13.7 g/dL (ref 12.0–16.0)
MCH: 30.6 pg (ref 26.0–34.0)
MCHC: 33.7 g/dL (ref 32.0–36.0)
MCV: 90.8 fL (ref 80.0–100.0)
PLATELETS: 276 10*3/uL (ref 150–440)
RBC: 4.47 MIL/uL (ref 3.80–5.20)
RDW: 14 % (ref 11.5–14.5)
WBC: 7.6 10*3/uL (ref 3.6–11.0)

## 2017-03-16 LAB — PROTIME-INR
INR: 0.96
PROTHROMBIN TIME: 12.8 s (ref 11.4–15.2)

## 2017-03-16 LAB — TROPONIN I

## 2017-03-16 LAB — APTT: APTT: 28 s (ref 24–36)

## 2017-03-16 MED ORDER — ONDANSETRON HCL 4 MG PO TABS: 4.0000 mg | ORAL_TABLET | Freq: Every day | ORAL | Status: DC | PRN

## 2017-03-16 MED ORDER — TIMOLOL MALEATE 0.5 % OP SOLN
1.0000 [drp] | Freq: Two times a day (BID) | OPHTHALMIC | Status: DC
  Administered 2017-03-16 – 2017-03-17 (×2): 1 [drp] via OPHTHALMIC
  Filled 2017-03-16: qty 5

## 2017-03-16 MED ORDER — ACETAMINOPHEN 160 MG/5ML PO SOLN
650.0000 mg | ORAL | Status: DC | PRN
  Filled 2017-03-16: qty 20.3

## 2017-03-16 MED ORDER — ACETAMINOPHEN 650 MG RE SUPP: 650.0000 mg | RECTAL | Status: DC | PRN

## 2017-03-16 MED ORDER — ASPIRIN 81 MG PO CHEW
324.0000 mg | CHEWABLE_TABLET | Freq: Once | ORAL | Status: AC
  Administered 2017-03-16: 324 mg via ORAL
  Filled 2017-03-16: qty 4

## 2017-03-16 MED ORDER — ACETAMINOPHEN 325 MG PO TABS: 650.0000 mg | ORAL_TABLET | ORAL | Status: DC | PRN

## 2017-03-16 MED ORDER — IOPAMIDOL (ISOVUE-370) INJECTION 76%
75.0000 mL | Freq: Once | INTRAVENOUS | Status: AC | PRN
  Administered 2017-03-16: 75 mL via INTRAVENOUS

## 2017-03-16 MED ORDER — PANTOPRAZOLE SODIUM 40 MG PO TBEC
40.0000 mg | DELAYED_RELEASE_TABLET | Freq: Every day | ORAL | Status: DC
  Administered 2017-03-17: 11:00:00 40 mg via ORAL
  Filled 2017-03-16: qty 1

## 2017-03-16 MED ORDER — ASPIRIN EC 81 MG PO TBEC
81.0000 mg | DELAYED_RELEASE_TABLET | Freq: Every day | ORAL | Status: DC
  Administered 2017-03-17: 81 mg via ORAL
  Filled 2017-03-16: qty 1

## 2017-03-16 MED ORDER — PREDNISOLONE ACETATE 1 % OP SUSP
1.0000 [drp] | Freq: Two times a day (BID) | OPHTHALMIC | Status: DC
  Administered 2017-03-16 – 2017-03-17 (×2): 1 [drp] via OPHTHALMIC
  Filled 2017-03-16: qty 1

## 2017-03-16 MED ORDER — FLUTICASONE PROPIONATE 50 MCG/ACT NA SUSP
2.0000 | Freq: Every day | NASAL | Status: DC
  Administered 2017-03-16 – 2017-03-17 (×2): 2 via NASAL
  Filled 2017-03-16: qty 16

## 2017-03-16 MED ORDER — CALCIUM CARBONATE 1250 (500 CA) MG PO TABS
625.0000 mg | ORAL_TABLET | Freq: Every day | ORAL | Status: DC
  Filled 2017-03-16: qty 0.5

## 2017-03-16 MED ORDER — ENOXAPARIN SODIUM 40 MG/0.4ML ~~LOC~~ SOLN
40.0000 mg | SUBCUTANEOUS | Status: DC
  Administered 2017-03-16: 40 mg via SUBCUTANEOUS
  Filled 2017-03-16: qty 0.4

## 2017-03-16 MED ORDER — STROKE: EARLY STAGES OF RECOVERY BOOK
Freq: Once | Status: AC
  Administered 2017-03-16: 16:00:00

## 2017-03-16 MED ORDER — TETRACAINE HCL 0.5 % OP SOLN
1.0000 [drp] | Freq: Once | OPHTHALMIC | Status: DC
  Filled 2017-03-16: qty 2
  Filled 2017-03-16: qty 4

## 2017-03-16 NOTE — ED Provider Notes (Signed)
[facility] Emergency Department Provider Note  ____________________________________________   First MD Initiated Contact with Patient 03/16/17 1046     (approximate)  I have reviewed the triage vital signs and the nursing notes.   HISTORY  Chief Complaint Loss of Vision    HPI Christy Cisneros is a 83 y.o. female who sent to the emergency department by her optometrist for visual abnormality. According to the patient she stood up yesterday and was walking home when she had a gradual onset "blurriness" in her right eye. She never lost vision completely. She called her neighbor because she said her vision was "hazy". She never had a headache. She never had any pain whatsoever. She never had any numbness or weakness. No chest pain shortness of breath abdominal pain nausea or vomiting. Today she presented to her OD who left the following note   "I would appreciate your evaluation of Christy Cisneros.  She is a 83 year old woman who had 2 episodes of complete vision loss yesterday afternoon. This seemed to return without permanent defects. She is a patient with advanced open angle glaucoma who has had complete vision loss in the left eye several years ago due to glaucoma. Her glaucoma has remained stable in her right eye without eyedrops. No retinal arteriolar plaques are seen. No sign of central retinal artery occlusion was seen. She denied symptoms of giant cell arteritis or chest pain or jaw claudication. His evaluate her for TIAs of the right eye. She may need a cardiovascular evaluation, CT and carotid Doppler evaluation today of her carotid arteries along with blood workup for cause of her symptoms."   Past Medical History:  Diagnosis Date  . Barrett's esophagus with esophagitis   . Chronic bronchitis ([place])   . GERD (gastroesophageal reflux disease)   . Glaucoma   . Hypercholesteremia   . Osteoarthritis   . Osteoporosis   . Sleep apnea    has CPAP  .  TIA (transient ischemic attack)     Patient Active Problem List   Diagnosis Date Noted  . Memory change 09/04/2016  . Pneumonia 07/17/2016  . Mild cognitive impairment 05/15/2016  . Dizziness 04/21/2016  . Neck pain 02/11/2016  . Muscle cramps 11/14/2015  . Urine incontinence 08/16/2015  . Hematoma 08/16/2015  . Fatigue 07/12/2015  . Headache 06/16/2015  . Unsteady gait 06/01/2015  . GERD (gastroesophageal reflux disease) 08/11/2014  . UTI (urinary tract infection) 08/07/2014  . DYSPNEA 04/17/2009  . HYPERCHOLESTEROLEMIA 04/16/2009  . Obstructive sleep apnea 04/16/2009  . Glaucoma 04/16/2009  . Transient cerebral ischemia 04/16/2009  . BARRETTS ESOPHAGUS 04/16/2009  . OSTEOARTHRITIS 04/16/2009  . Osteoporosis 04/16/2009    Past Surgical History:  Procedure Laterality Date  . BUNIONECTOMY     left  . ROTATOR CUFF REPAIR     right    Prior to Admission medications   Medication Sig Start Date End Date Taking? Authorizing Provider  Aloe LIQD Take by mouth daily.    Yes [provider]  aspirin 81 MG tablet Take 81 mg by mouth daily.   Yes [provider]  Bilberry, Vaccinium myrtillus, (BILBERRY PO) Take 1 capsule by mouth daily.   Yes [provider]  calcium carbonate (OS-CAL) 600 MG TABS Take 600 mg by mouth daily.   Yes [provider]  Flaxseed, Linseed, (GROUND FLAX SEEDS PO) Take by mouth daily.    Yes [provider]  Multiple Vitamins-Minerals (PRESERVISION AREDS 2 PO) Take by mouth 2 (two)  times daily.   Yes [provider]  omeprazole (PRILOSEC) 20 MG capsule TAKE ONE CAPSULE TWICE A DAY BEFORE MEALS 11/17/16  Yes Einar Pheasant, MD  prednisoLONE acetate (PRED FORTE) 1 % ophthalmic suspension Place 1 drop into the left eye 2 (two) times daily.   Yes [provider]  timolol (BETIMOL) 0.5 % ophthalmic solution Place 1 drop into the left eye 2 (two) times daily.    Yes [provider]    fluticasone (FLONASE) 50 MCG/ACT nasal spray Place 2 sprays into both nostrils daily. Patient not taking: Reported on 03/16/2017 07/22/16   Einar Pheasant, MD  ondansetron (ZOFRAN) 4 MG tablet Take 1 tablet (4 mg total) by mouth daily as needed for nausea or vomiting. Patient not taking: Reported on 03/16/2017 10/14/16 10/14/17  Darel Hong, MD    Allergies Alphagan [brimonidine]; Avelox [moxifloxacin hcl in nacl]; Bacitracin; Cefdinir; Cephalexin; Clarithromycin; Codeine; Erythromycin; Hydrocodone-acetaminophen; Lansoprazole; Polymyxin b; and Vigamox [moxifloxacin]  Family History  Problem Relation Age of Onset  . Breast cancer Sister   . Heart disease Mother     Social History Social History  Substance Use Topics  . Smoking status: Never Smoker  . Smokeless tobacco: Never Used  . Alcohol use 0.0 oz/week     Comment: wine/brandy occasional    Review of Systems Constitutional: No fever/chills Eyes: Positive visual changes. ENT: No sore throat. Cardiovascular: Denies chest pain. Respiratory: Denies shortness of breath. Gastrointestinal: No abdominal pain.  No nausea, no vomiting.  No diarrhea.  No constipation. Genitourinary: Negative for dysuria. Musculoskeletal: Negative for back pain. Skin: Negative for rash. Neurological: Negative for headaches, focal weakness or numbness.   ____________________________________________   PHYSICAL EXAM:  VITAL SIGNS: ED Triage Vitals [03/16/17 1023]  Enc Vitals Group     BP (!) 158/85     Pulse Rate 85     Resp 18     Temp 97.6 F (36.4 C)     Temp Source Oral     SpO2 95 %     Weight 104 lb (47.2 kg)     Height      Head Circumference      Peak Flow      Pain Score      Pain Loc      Pain Edu?      Excl. in [place]?     Constitutional: Alert and oriented 4 pleasant cooperative speaks in full clear sentences no diaphoresis Eyes: Bilateral eyes dilated and nonreactive extraocular motions intact no injection or  discharge Head: Atraumatic. Nose: No congestion/rhinnorhea. Mouth/Throat: No trismus Neck: No stridor.   Cardiovascular: Normal rate, regular rhythm. Grossly normal heart sounds.  Good peripheral circulation. Respiratory: Normal respiratory effort.  No retractions. Lungs CTAB and moving good air Gastrointestinal: Soft nontender Musculoskeletal: No lower extremity edema   Neurologic:  Normal speech and language. Cranial nerves II through XII intact 5 out of 5 grips biceps triceps hip flexion and hip extension plantar flexion dorsiflexion sensation intact to light touch throughout Skin:  Skin is warm, dry and intact. No rash noted. Psychiatric: Mood and affect are normal. Speech and behavior are normal.    ____________________________________________   DIFFERENTIAL includes but not limited to  Amaurosis fugax, glaucoma, retinal vein occlusion, retinal artery occlusion, transient ischemic attack ____________________________________________   LABS (all labs ordered are listed, but only abnormal results are displayed)  Labs Reviewed  COMPREHENSIVE METABOLIC PANEL - Abnormal; Notable for the following:       Result Value  Glucose, Bld 128 (*)    BUN 21 (*)    All other components within normal limits  PROTIME-INR  APTT  CBC  DIFFERENTIAL  TROPONIN I    No signs of acute ischemia __________________________________________  EKG  ED ECG REPORT I, Darel Hong, the attending physician, personally viewed and interpreted this ECG.  Date: 03/16/2017 Rate: 87 Rhythm: normal sinus rhythm QRS Axis: normal Intervals: normal ST/T Wave abnormalities: normal Narrative Interpretation: unremarkable  ____________________________________________  RADIOLOGY  CT scan shows possible lacunar pontine infarct C TMJ with no large vessel occlusion but significant chronic disease ____________________________________________   PROCEDURES  Procedure(s) performed:  no  Procedures  Critical Care performed: yes  CRITICAL CARE Performed by: Darel Hong   Total critical care time: 35 minutes  Critical care time was exclusive of separately billable procedures and treating other patients.  Critical care was necessary to treat or prevent imminent or life-threatening deterioration.  Critical care was time spent personally by me on the following activities: development of treatment plan with patient and/or surrogate as well as nursing, discussions with consultants, evaluation of patient's response to treatment, examination of patient, obtaining history from patient or surrogate, ordering and performing treatments and interventions, ordering and review of laboratory studies, ordering and review of radiographic studies, pulse oximetry and re-evaluation of patient's condition.   Observation: no ____________________________________________   INITIAL IMPRESSION / ASSESSMENT AND PLAN / ED COURSE  Pertinent labs & imaging results that were available during my care of the patient were reviewed by me and considered in my medical decision making (see chart for details).  The patient's history that she gives me is significantly different than the history she gives her optometrist today. She told me that she never had any falling curtain or complete loss of vision but gradual blurriness in the right eye. The optometrist I think is rightly concerned for amaurosis fugax given the history that she provided. The remainder of her neurological exam is intact and her eye exam is normal in the right eye with chronic changes in the left. Head CT raises the concern for possible pontine infarct slowly child to neurology to discuss. CT angio with no large vessel occlusion.     ----------------------------------------- 2:04 PM on 03/16/2017 -----------------------------------------  I discussed the case with on-call neurologist Dr. Doy Mince who recommends inpatient  admission to telemetry as she feels this pontine infarct may represent an embolic event. ____________________________________________   FINAL CLINICAL IMPRESSION(S) / ED DIAGNOSES  Final diagnoses:  Blurred vision  Atherosclerosis  Cerebrovascular accident (CVA), unspecified mechanism ([place])      NEW MEDICATIONS STARTED DURING THIS VISIT:  New Prescriptions   No medications on file     Note:  This document was prepared using Dragon voice recognition software and may include unintentional dictation errors.     Darel Hong, MD 03/16/17 [phone]

## 2017-03-16 NOTE — Consult Note (Signed)
Referring Physician: Rifenbark    Chief Complaint: Visual changes  HPI: Christy Cisneros is an 83 y.o. female with a history of TIA who is unable to provide any useful history due to her memory problems.  No family present.  All history obtained from the chart.  The patient was sent to the emergency department by her optometrist for visual abnormality.  It appears per her report that she stood up yesterday and was walking home when she had a gradual onset "blurriness" in her right eye. She never lost vision completely. She called her neighbor because she said her vision was "hazy". She never had a headache. She never had any pain whatsoever. She never had any numbness or weakness. No chest pain shortness of breath abdominal pain nausea or vomiting. Today she presented to her OD who obtained the history that she experienced 2 episodes of complete vision loss yesterday afternoon. This seemed to return without permanent defects. She is a patient with advanced open angle glaucoma who has had complete vision loss in the left eye several years ago due to glaucoma. Her glaucoma has remained stable in her right eye without eyedrops. No retinal arteriolar plaques are seen. No sign of central retinal artery occlusion was seen. She denied symptoms of giant cell arteritis or chest pain or jaw claudication.  Initial NIHSS of 2.   Date last known well: Unable to determine Time last known well: Unable to determine tPA Given: No: Resolution of symptoms  Past Medical History:  Diagnosis Date  . Barrett's esophagus with esophagitis   . Chronic bronchitis ([place])   . GERD (gastroesophageal reflux disease)   . Glaucoma   . Hypercholesteremia   . Osteoarthritis   . Osteoporosis   . Sleep apnea    has CPAP  . TIA (transient ischemic attack)     Past Surgical History:  Procedure Laterality Date  . BUNIONECTOMY     left  . ROTATOR CUFF REPAIR     right    Family History  Problem Relation Age of Onset  .  Breast cancer Sister   . Heart disease Mother    Social History:  reports that she has never smoked. She has never used smokeless tobacco. She reports that she drinks alcohol. She reports that she does not use drugs.  Allergies:  Allergies  Allergen Reactions  . Alphagan [Brimonidine]     Burning, stinging, bloody eye, dry mouth  . Avelox [Moxifloxacin Hcl In Nacl] Other (See Comments)    Dizziness, weakness  . Bacitracin     Tearing, itching, swelling, redness  . Cefdinir Nausea And Vomiting  . Cephalexin     HA, nausea, gas, abdominal pain  . Clarithromycin     HA, nausea, gas, abdominal pain  . Codeine     HA, nausea, gas, abdominal pain  . Erythromycin     HA, nausea, gas, abdominal pain  . Hydrocodone-Acetaminophen     REACTION: chest pain, constipation, nausea  . Lansoprazole     REACTION: cramps, nausea, diarrhea  . Polymyxin B     Itching and redness  . Vigamox [Moxifloxacin] Itching    Soreness, irritability, loss of appetite,  Dizziness, diarrhea, muscle weakness    Medications: I have reviewed the patient's current medications. Prior to Admission:  Prior to Admission medications   Medication Sig Start Date End Date Taking? Authorizing Provider  Aloe LIQD Take by mouth daily.    Yes [provider]  aspirin 81 MG tablet Take 81  mg by mouth daily.   Yes [provider]  Bilberry, Vaccinium myrtillus, (BILBERRY PO) Take 1 capsule by mouth daily.   Yes [provider]  calcium carbonate (OS-CAL) 600 MG TABS Take 600 mg by mouth daily.   Yes [provider]  Flaxseed, Linseed, (GROUND FLAX SEEDS PO) Take by mouth daily.    Yes [provider]  Multiple Vitamins-Minerals (PRESERVISION AREDS 2 PO) Take by mouth 2 (two) times daily.   Yes [provider]  omeprazole (PRILOSEC) 20 MG capsule TAKE ONE CAPSULE TWICE A DAY BEFORE MEALS 11/17/16  Yes Einar Pheasant, MD  prednisoLONE acetate (PRED FORTE) 1 % ophthalmic  suspension Place 1 drop into the left eye 2 (two) times daily.   Yes [provider]  timolol (BETIMOL) 0.5 % ophthalmic solution Place 1 drop into the left eye 2 (two) times daily.    Yes [provider]  fluticasone (FLONASE) 50 MCG/ACT nasal spray Place 2 sprays into both nostrils daily. Patient not taking: Reported on 03/16/2017 07/22/16   Einar Pheasant, MD  ondansetron (ZOFRAN) 4 MG tablet Take 1 tablet (4 mg total) by mouth daily as needed for nausea or vomiting. Patient not taking: Reported on 03/16/2017 10/14/16 10/14/17  Darel Hong, MD    ROS: History obtained from the patient and chart  General ROS: negative for - chills, fatigue, fever, night sweats, weight gain or weight loss Psychological ROS: negative for - behavioral disorder, hallucinations, memory difficulties, mood swings or suicidal ideation Ophthalmic ROS: left eye visual loss ENT ROS: negative for - epistaxis, nasal discharge, oral lesions, sore throat, tinnitus or vertigo Allergy and Immunology ROS: negative for - hives or itchy/watery eyes Hematological and Lymphatic ROS: negative for - bleeding problems, bruising or swollen lymph nodes Endocrine ROS: negative for - galactorrhea, hair pattern changes, polydipsia/polyuria or temperature intolerance Respiratory ROS: negative for - cough, hemoptysis, shortness of breath or wheezing Cardiovascular ROS: negative for - chest pain, dyspnea on exertion, edema or irregular heartbeat Gastrointestinal ROS: negative for - abdominal pain, diarrhea, hematemesis, nausea/vomiting or stool incontinence Genito-Urinary ROS: negative for - dysuria, hematuria, incontinence or urinary frequency/urgency Musculoskeletal ROS: leg pain secondary to healing wounds Neurological ROS: as noted in HPI Dermatological ROS: negative for rash and skin lesion changes  Physical Examination: Blood pressure 137/82, pulse 71, temperature 97.7 F (36.5 C), resp. rate 16, weight 47.2 kg  (104 lb), SpO2 96 %.  HEENT-  Normocephalic, no lesions, without obvious abnormality.  Normal external eye and conjunctiva.  Normal TM's bilaterally.  Normal auditory canals and external ears. Normal external nose, mucus membranes and septum.  Normal pharynx. Cardiovascular- S1, S2 normal, pulses palpable throughout   Lungs- chest clear, no wheezing, rales, normal symmetric air entry Abdomen- soft, non-tender; bowel sounds normal; no masses,  no organomegaly Extremities- no edema Lymph-no adenopathy palpable Musculoskeletal-no joint tenderness, deformity or swelling Skin-skin changes on lower extremities  Neurological Examination   Mental Status: Alert.  Unable to provide cogent history.  Speech fluent without evidence of aphasia.  Able to follow 3 step commands without difficulty. Cranial Nerves: II: Discs flat bilaterally; Visual fields grossly normal in the right eye.  Blind in the left eye.  Right pupil reactive.  Left pupil fixed.   III,IV, VI: left ptosis, extra-ocular motions intact bilaterally V,VII: mild decrease in the right NLF, facial light touch sensation normal bilaterally VIII: hearing normal bilaterally IX,X: gag reflex present XI: bilateral shoulder shrug XII: midline tongue extension Motor: Right : Upper extremity  5/5    Left:     Upper extremity   5/5  Lower extremity   5/5     Lower extremity   5/5 Tone and bulk:normal tone throughout; no atrophy noted Sensory: Pinprick and light touch intact throughout, bilaterally Deep Tendon Reflexes: 2+ and symmetric with absent AJ's bilaterally Plantars: Right: equivocal   Left: equivocal Cerebellar: Normal finger-to-nose testing bilaterally Gait: not tested due to safety concerns    Laboratory Studies:  Basic Metabolic Panel:  Recent Labs Lab 03/16/17 1029  NA 140  K 4.3  CL 109  CO2 23  GLUCOSE 128*  BUN 21*  CREATININE 0.71  CALCIUM 9.4    Liver Function Tests:  Recent Labs Lab 03/16/17 1029  AST  28  ALT 18  ALKPHOS 51  BILITOT 0.7  PROT 6.7  ALBUMIN 4.1   No results for input(s): LIPASE, AMYLASE in the last 168 hours. No results for input(s): AMMONIA in the last 168 hours.  CBC:  Recent Labs Lab 03/16/17 1029  WBC 7.6  NEUTROABS 6.0  HGB 13.7  HCT 40.6  MCV 90.8  PLT 276    Cardiac Enzymes:  Recent Labs Lab 03/16/17 1029  TROPONINI <0.03    BNP: Invalid input(s): POCBNP  CBG: No results for input(s): GLUCAP in the last 168 hours.  Microbiology: Results for orders placed or performed in visit on 02/07/17  Urine Culture     Status: None   Collection Time: 02/07/17  4:03 PM  Result Value Ref Range Status   Organism ID, Bacteria NO GROWTH  Final    Coagulation Studies:  Recent Labs  03/16/17 1029  LABPROT 12.8  INR 0.96    Urinalysis: No results for input(s): COLORURINE, LABSPEC, PHURINE, GLUCOSEU, HGBUR, BILIRUBINUR, KETONESUR, PROTEINUR, UROBILINOGEN, NITRITE, LEUKOCYTESUR in the last 168 hours.  Invalid input(s): APPERANCEUR  Lipid Panel:    Component Value Date/Time   CHOL 220 (H) 06/16/2015 0916   CHOL 170 12/07/2012 0153   TRIG 64.0 06/16/2015 0916   TRIG 62 12/07/2012 0153   HDL 82.20 06/16/2015 0916   HDL 65 (H) 12/07/2012 0153   CHOLHDL 3 06/16/2015 0916   VLDL 12.8 06/16/2015 0916   VLDL 12 12/07/2012 0153   LDLCALC 125 (H) 06/16/2015 0916   LDLCALC 93 12/07/2012 0153    HgbA1C: No results found for: HGBA1C  Urine Drug Screen:  No results found for: LABOPIA, COCAINSCRNUR, LABBENZ, AMPHETMU, THCU, LABBARB  Alcohol Level: No results for input(s): ETH in the last 168 hours.  Other results: EKG: normal sinus rhythm at 87 bpm.  Imaging: Ct Angio Head W Or Wo Contrast  Result Date: 03/16/2017 CLINICAL DATA:  Two episodes of complete vision loss yesterday. TIA. EXAM: CT ANGIOGRAPHY HEAD AND NECK TECHNIQUE: Multidetector CT imaging of the head and neck was performed using the standard protocol during bolus administration of  intravenous contrast. Multiplanar CT image reconstructions and MIPs were obtained to evaluate the vascular anatomy. Carotid stenosis measurements (when applicable) are obtained utilizing NASCET criteria, using the distal internal carotid diameter as the denominator. CONTRAST:  75 cc Isovue 370 intravenous COMPARISON:  No comparison angiography FINDINGS: CTA NECK FINDINGS Aortic arch: Atherosclerosis diffusely. The brachiocephalic and left common carotid origins were not covered for evaluated. No acute finding. Right carotid system: Smooth and widely patent. Minimal atheromatous changes at the bifurcation. Left carotid system: Smooth and widely patent. No notable atheromatous changes. Vertebral arteries: Atherosclerotic plaque at the origin of the left subclavian artery without stenosis or ulceration. Mild  narrowing of the right vertebral artery at its origin due to noncalcified plaque. Negative for dissection. Skeleton: Advanced cervical facet arthropathy and disc degeneration. Advanced atlantodental degeneration. Other neck: Chronic sinusitis, especially the right maxillary antrum where there is atelectasis and secretions. 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 Upper chest: No acute finding Review of the MIP images confirms the above findings CTA HEAD FINDINGS Anterior circulation: Mild atheromatous changes on the widely patent carotid siphons. No major branch occlusion or reversible proximal flow limiting stenosis. Extensive atherosclerotic irregularity of bilateral medium sized vessels, with notably severe bilateral M2 and right A3 narrowings. There is a moderate narrowing involving the origin of the first branch right M1 segment. These are best seen on thick MIPS. Negative for aneurysm Posterior circulation: Left dominant vertebrobasilar system. Bilateral picas are widely patent. Basilar is smooth and widely patent. Hypoplastic right P1 segment. Diffuse atherosclerotic irregularity of bilateral PCAs. There is up  to moderate bilateral P2 segment narrowing. Bilateral P3 and distal branch high-grade narrowings best seen on MIPS. Venous sinuses: Patent Anatomic variants: Fetal type right PCA. Delayed phase: No abnormal intracranial enhancement. Remote infarcts in the bilateral cerebellum and right pons based on priors. Extensive chronic microvascular ischemic change in the cerebral white matter. Review of the MIP images confirms the above findings IMPRESSION: 1. No emergent large vessel occlusion. 2. Extensive atherosclerosis of medium size vessels in the anterior and posterior circulation with multifocal high-grade narrowing. 3. Mild atherosclerosis in the neck without stenosis or ulceration. 4. Partial coverage of the aortic arch. The brachiocephalic and left common carotid origins were not seen. 5. Remote bilateral cerebellar and right pontine infarcts. Extensive chronic small vessel ischemia in the cerebral white matter. Electronically Signed   By: Monte Fantasia M.D.   On: 03/16/2017 12:40   Ct Head Wo Contrast  Result Date: 03/16/2017 CLINICAL DATA:  Possible transient ischemic attack, loss of vision yesterday EXAM: CT HEAD WITHOUT CONTRAST TECHNIQUE: Contiguous axial images were obtained from the base of the skull through the vertex without intravenous contrast. COMPARISON:  CT brain scan of 09/15/2016 FINDINGS: Brain: The ventricular system remains prominent as are the cortical sulci and cerebellar folia, consistent with diffuse atrophy. The septum remains midline in position. Moderately severe small vessel ischemic change remains throughout the periventricular white matter and small lacunar infarcts are noted. There is a questionable small lacunar infarct within the pons on images number 8 and 9, not definitely seen previously. MRI may be helpful if further assessment is warranted. No hemorrhage or mass lesion is seen. Vascular: No vascular abnormality is noted on this unenhanced study. Skull: On bone window  images, no calvarial abnormality is noted. Sinuses/Orbits: There is mild mucosal thickening in the ethmoid air cells. No air-fluid level is seen. Other: Prior left eye surgery again noted. IMPRESSION: 1. Small low-attenuation within the right pons not definitely seen previously. Cannot exclude a small pontine lacunar infarct. Consider MRI if further assessment is warranted. No hemorrhage. 2. No change in moderately severe small vessel ischemic change throughout the periventricular white matter with stable old lacunar infarcts and diffuse atrophy. Electronically Signed   By: Ivar Drape M.D.   On: 03/16/2017 11:15   Ct Angio Neck W And/or Wo Contrast  Result Date: 03/16/2017 CLINICAL DATA:  Two episodes of complete vision loss yesterday. TIA. EXAM: CT ANGIOGRAPHY HEAD AND NECK TECHNIQUE: Multidetector CT imaging of the head and neck was performed using the standard protocol during bolus administration of intravenous contrast. Multiplanar CT image reconstructions and  MIPs were obtained to evaluate the vascular anatomy. Carotid stenosis measurements (when applicable) are obtained utilizing NASCET criteria, using the distal internal carotid diameter as the denominator. CONTRAST:  75 cc Isovue 370 intravenous COMPARISON:  No comparison angiography FINDINGS: CTA NECK FINDINGS Aortic arch: Atherosclerosis diffusely. The brachiocephalic and left common carotid origins were not covered for evaluated. No acute finding. Right carotid system: Smooth and widely patent. Minimal atheromatous changes at the bifurcation. Left carotid system: Smooth and widely patent. No notable atheromatous changes. Vertebral arteries: Atherosclerotic plaque at the origin of the left subclavian artery without stenosis or ulceration. Mild narrowing of the right vertebral artery at its origin due to noncalcified plaque. Negative for dissection. Skeleton: Advanced cervical facet arthropathy and disc degeneration. Advanced atlantodental  degeneration. Other neck: Chronic sinusitis, especially the right maxillary antrum where there is atelectasis and secretions. 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 Upper chest: No acute finding Review of the MIP images confirms the above findings CTA HEAD FINDINGS Anterior circulation: Mild atheromatous changes on the widely patent carotid siphons. No major branch occlusion or reversible proximal flow limiting stenosis. Extensive atherosclerotic irregularity of bilateral medium sized vessels, with notably severe bilateral M2 and right A3 narrowings. There is a moderate narrowing involving the origin of the first branch right M1 segment. These are best seen on thick MIPS. Negative for aneurysm Posterior circulation: Left dominant vertebrobasilar system. Bilateral picas are widely patent. Basilar is smooth and widely patent. Hypoplastic right P1 segment. Diffuse atherosclerotic irregularity of bilateral PCAs. There is up to moderate bilateral P2 segment narrowing. Bilateral P3 and distal branch high-grade narrowings best seen on MIPS. Venous sinuses: Patent Anatomic variants: Fetal type right PCA. Delayed phase: No abnormal intracranial enhancement. Remote infarcts in the bilateral cerebellum and right pons based on priors. Extensive chronic microvascular ischemic change in the cerebral white matter. Review of the MIP images confirms the above findings IMPRESSION: 1. No emergent large vessel occlusion. 2. Extensive atherosclerosis of medium size vessels in the anterior and posterior circulation with multifocal high-grade narrowing. 3. Mild atherosclerosis in the neck without stenosis or ulceration. 4. Partial coverage of the aortic arch. The brachiocephalic and left common carotid origins were not seen. 5. Remote bilateral cerebellar and right pontine infarcts. Extensive chronic small vessel ischemia in the cerebral white matter. Electronically Signed   By: Monte Fantasia M.D.   On: 03/16/2017 12:40    Assessment:  83 y.o. female presenting after 2 episodes of presumed amarousis.  Head CT reviewed and shows a possible right pontine infarct.  Although not suspected to have caused the patient's current symptoms, can not rule out an embolic event.  CTA shows no evidence of large vessel occlusion but does show extensive atherosclerosis.  Patient on ASA at home.  Further work up recommended.    Stroke Risk Factors - hyperlipidemia  Plan: 1. HgbA1c, fasting lipid panel, ESR, CRP 2. MRI of the brain without contrast 3. PT consult, OT consult, Speech consult 4. Echocardiogram 5. Prophylactic therapy-Continue ASA 6. NPO until RN stroke swallow screen 7. Telemetry monitoring 8. Frequent neuro checks  Case discussed with Dr. Larence Penning, MD Neurology [phone] 03/16/2017, 3:10 PM

## 2017-03-16 NOTE — H&P (Signed)
[facility] NAME: Christy Cisneros    MR#:  [id number]  DATE OF BIRTH:  28-Apr-1920  DATE OF ADMISSION:  03/16/2017  PRIMARY CARE PHYSICIAN: Einar Pheasant, MD   REQUESTING/REFERRING PHYSICIAN: Darel Hong   CHIEF COMPLAINT:   Chief Complaint  Patient presents with  . Loss of Vision    HISTORY OF PRESENT ILLNESS: Christy Cisneros  is a 83 y.o. female with a known history of TIA and likely dementia was a poor historian. She was sent from her ophthalmologist due to blurry vision in the right eye. At the time of my visit to her room patient was actually asking for a magazine to read. She does report that she does have trouble with the left eye vision and can't see much through there.patient's CT scan of the head showed a pons lesion.    PAST MEDICAL HISTORY:   Past Medical History:  Diagnosis Date  . Barrett's esophagus with esophagitis   . Chronic bronchitis ([place])   . GERD (gastroesophageal reflux disease)   . Glaucoma   . Hypercholesteremia   . Osteoarthritis   . Osteoporosis   . Sleep apnea    has CPAP  . TIA (transient ischemic attack)     PAST SURGICAL HISTORY: Past Surgical History:  Procedure Laterality Date  . BUNIONECTOMY     left  . ROTATOR CUFF REPAIR     right    SOCIAL HISTORY:  Social History  Substance Use Topics  . Smoking status: Never Smoker  . Smokeless tobacco: Never Used  . Alcohol use 0.0 oz/week     Comment: wine/brandy occasional    FAMILY HISTORY:  Family History  Problem Relation Age of Onset  . Breast cancer Sister   . Heart disease Mother     DRUG ALLERGIES:  Allergies  Allergen Reactions  . Alphagan [Brimonidine]     Burning, stinging, bloody eye, dry mouth  . Avelox [Moxifloxacin Hcl In Nacl] Other (See Comments)    Dizziness, weakness  . Bacitracin     Tearing, itching, swelling, redness  . Cefdinir Nausea And Vomiting  . Cephalexin     HA, nausea, gas, abdominal  pain  . Clarithromycin     HA, nausea, gas, abdominal pain  . Codeine     HA, nausea, gas, abdominal pain  . Erythromycin     HA, nausea, gas, abdominal pain  . Hydrocodone-Acetaminophen     REACTION: chest pain, constipation, nausea  . Lansoprazole     REACTION: cramps, nausea, diarrhea  . Polymyxin B     Itching and redness  . Vigamox [Moxifloxacin] Itching    Soreness, irritability, loss of appetite,  Dizziness, diarrhea, muscle weakness    REVIEW OF SYSTEMS:   CONSTITUTIONAL: No fever, fatigue or weakness.  EYES: right eye blurred vision EARS, NOSE, AND THROAT: No tinnitus or ear pain.  RESPIRATORY: No cough, shortness of breath, wheezing or hemoptysis.  CARDIOVASCULAR: No chest pain, orthopnea, edema.  GASTROINTESTINAL: No nausea, vomiting, diarrhea or abdominal pain.  GENITOURINARY: No dysuria, hematuria.  ENDOCRINE: No polyuria, nocturia,  HEMATOLOGY: No anemia, easy bruising or bleeding SKIN: No rash or lesion. MUSCULOSKELETAL: No joint pain or arthritis.   NEUROLOGIC: No tingling, numbness, weakness.  PSYCHIATRY: No anxiety or depression.   MEDICATIONS AT HOME:  Prior to Admission medications   Medication Sig Start Date End Date Taking? Authorizing Provider  Aloe LIQD Take by mouth daily.    Yes [provider]  aspirin 81 MG tablet Take 81 mg by mouth daily.   Yes [provider]  Bilberry, Vaccinium myrtillus, (BILBERRY PO) Take 1 capsule by mouth daily.   Yes [provider]  calcium carbonate (OS-CAL) 600 MG TABS Take 600 mg by mouth daily.   Yes [provider]  Flaxseed, Linseed, (GROUND FLAX SEEDS PO) Take by mouth daily.    Yes [provider]  Multiple Vitamins-Minerals (PRESERVISION AREDS 2 PO) Take by mouth 2 (two) times daily.   Yes [provider]  omeprazole (PRILOSEC) 20 MG capsule TAKE ONE CAPSULE TWICE A DAY BEFORE MEALS 11/17/16  Yes Einar Pheasant, MD  prednisoLONE acetate (PRED FORTE) 1 %  ophthalmic suspension Place 1 drop into the left eye 2 (two) times daily.   Yes [provider]  timolol (BETIMOL) 0.5 % ophthalmic solution Place 1 drop into the left eye 2 (two) times daily.    Yes [provider]  fluticasone (FLONASE) 50 MCG/ACT nasal spray Place 2 sprays into both nostrils daily. Patient not taking: Reported on 03/16/2017 07/22/16   Einar Pheasant, MD  ondansetron (ZOFRAN) 4 MG tablet Take 1 tablet (4 mg total) by mouth daily as needed for nausea or vomiting. Patient not taking: Reported on 03/16/2017 10/14/16 10/14/17  Darel Hong, MD      PHYSICAL EXAMINATION:   VITAL SIGNS: Blood pressure (!) 143/78, pulse 73, temperature 98.6 F (37 C), temperature source Oral, resp. rate 18, height 5\' 4"  (1.626 m), weight 106 lb 6.4 oz (48.3 kg), SpO2 100 %.  GENERAL:  83 y.o.-year-old patient lying in the bed with no acute distress.  EYES: Pupils equal, round, reactive to light and accommodation. No scleral icterus. Extraocular muscles intact.  HEENT: Head atraumatic, normocephalic. Oropharynx and nasopharynx clear.  NECK:  Supple, no jugular venous distention. No thyroid enlargement, no tenderness.  LUNGS: Normal breath sounds bilaterally, no wheezing, rales,rhonchi or crepitation. No use of accessory muscles of respiration.  CARDIOVASCULAR: S1, S2 normal. No murmurs, rubs, or gallops.  ABDOMEN: Soft, nontender, nondistended. Bowel sounds present. No organomegaly or mass.  EXTREMITIES: No pedal edema, cyanosis, or clubbing.  NEUROLOGIC: Cranial nerves II through XII are intact. Muscle strength 5/5 in all extremities. Sensation intact. Gait not checked.  PSYCHIATRIC: The patient is alert and oriented x 3.  SKIN: No obvious rash, lesion, or ulcer.   LABORATORY PANEL:   CBC  Recent Labs Lab 03/16/17 1029  WBC 7.6  HGB 13.7  HCT 40.6  PLT 276  MCV 90.8  MCH 30.6  MCHC 33.7  RDW 14.0  LYMPHSABS 1.0  MONOABS 0.5  EOSABS 0.1  BASOSABS 0.1    ------------------------------------------------------------------------------------------------------------------  Chemistries   Recent Labs Lab 03/16/17 1029  NA 140  K 4.3  CL 109  CO2 23  GLUCOSE 128*  BUN 21*  CREATININE 0.71  CALCIUM 9.4  AST 28  ALT 18  ALKPHOS 51  BILITOT 0.7   ------------------------------------------------------------------------------------------------------------------ estimated creatinine clearance is 32.8 mL/min (by C-G formula based on SCr of 0.71 mg/dL). ------------------------------------------------------------------------------------------------------------------ No results for input(s): TSH, T4TOTAL, T3FREE, THYROIDAB in the last 72 hours.  Invalid input(s): FREET3   Coagulation profile  Recent Labs Lab 03/16/17 1029  INR 0.96   ------------------------------------------------------------------------------------------------------------------- No results for input(s): DDIMER in the last 72 hours. -------------------------------------------------------------------------------------------------------------------  Cardiac Enzymes  Recent Labs Lab 03/16/17 1029  TROPONINI <0.03   ------------------------------------------------------------------------------------------------------------------ Invalid input(s): POCBNP  ---------------------------------------------------------------------------------------------------------------  Urinalysis    Component Value Date/Time   COLORURINE YELLOW 02/07/2017 1603  APPEARANCEUR CLEAR 02/07/2017 1603   APPEARANCEUR Clear 12/06/2012 1257   LABSPEC 1.025 02/07/2017 1603   LABSPEC 1.008 12/06/2012 1257   PHURINE 5.5 02/07/2017 1603   GLUCOSEU NEGATIVE 02/07/2017 1603   GLUCOSEU NEGATIVE 08/14/2015 1420   HGBUR NEGATIVE 02/07/2017 1603   BILIRUBINUR NEGATIVE 02/07/2017 1603   BILIRUBINUR neg 04/03/2015 0921   BILIRUBINUR Negative 12/06/2012 1257   KETONESUR NEGATIVE 02/07/2017  1603   PROTEINUR 2+ (A) 02/07/2017 1603   UROBILINOGEN 0.2 08/14/2015 1420   NITRITE NEGATIVE 02/07/2017 1603   LEUKOCYTESUR NEGATIVE 02/07/2017 1603   LEUKOCYTESUR Negative 12/06/2012 1257     RADIOLOGY: Ct Angio Head W Or Wo Contrast  Result Date: 03/16/2017 CLINICAL DATA:  Two episodes of complete vision loss yesterday. TIA. EXAM: CT ANGIOGRAPHY HEAD AND NECK TECHNIQUE: Multidetector CT imaging of the head and neck was performed using the standard protocol during bolus administration of intravenous contrast. Multiplanar CT image reconstructions and MIPs were obtained to evaluate the vascular anatomy. Carotid stenosis measurements (when applicable) are obtained utilizing NASCET criteria, using the distal internal carotid diameter as the denominator. CONTRAST:  75 cc Isovue 370 intravenous COMPARISON:  No comparison angiography FINDINGS: CTA NECK FINDINGS Aortic arch: Atherosclerosis diffusely. The brachiocephalic and left common carotid origins were not covered for evaluated. No acute finding. Right carotid system: Smooth and widely patent. Minimal atheromatous changes at the bifurcation. Left carotid system: Smooth and widely patent. No notable atheromatous changes. Vertebral arteries: Atherosclerotic plaque at the origin of the left subclavian artery without stenosis or ulceration. Mild narrowing of the right vertebral artery at its origin due to noncalcified plaque. Negative for dissection. Skeleton: Advanced cervical facet arthropathy and disc degeneration. Advanced atlantodental degeneration. Other neck: Chronic sinusitis, especially the right maxillary antrum where there is atelectasis and secretions. 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 Upper chest: No acute finding Review of the MIP images confirms the above findings CTA HEAD FINDINGS Anterior circulation: Mild atheromatous changes on the widely patent carotid siphons. No major branch occlusion or reversible proximal flow limiting stenosis.  Extensive atherosclerotic irregularity of bilateral medium sized vessels, with notably severe bilateral M2 and right A3 narrowings. There is a moderate narrowing involving the origin of the first branch right M1 segment. These are best seen on thick MIPS. Negative for aneurysm Posterior circulation: Left dominant vertebrobasilar system. Bilateral picas are widely patent. Basilar is smooth and widely patent. Hypoplastic right P1 segment. Diffuse atherosclerotic irregularity of bilateral PCAs. There is up to moderate bilateral P2 segment narrowing. Bilateral P3 and distal branch high-grade narrowings best seen on MIPS. Venous sinuses: Patent Anatomic variants: Fetal type right PCA. Delayed phase: No abnormal intracranial enhancement. Remote infarcts in the bilateral cerebellum and right pons based on priors. Extensive chronic microvascular ischemic change in the cerebral white matter. Review of the MIP images confirms the above findings IMPRESSION: 1. No emergent large vessel occlusion. 2. Extensive atherosclerosis of medium size vessels in the anterior and posterior circulation with multifocal high-grade narrowing. 3. Mild atherosclerosis in the neck without stenosis or ulceration. 4. Partial coverage of the aortic arch. The brachiocephalic and left common carotid origins were not seen. 5. Remote bilateral cerebellar and right pontine infarcts. Extensive chronic small vessel ischemia in the cerebral white matter. Electronically Signed   By: Monte Fantasia M.D.   On: 03/16/2017 12:40   Ct Head Wo Contrast  Result Date: 03/16/2017 CLINICAL DATA:  Possible transient ischemic attack, loss of vision yesterday EXAM: CT HEAD WITHOUT CONTRAST TECHNIQUE: Contiguous axial images were  obtained from the base of the skull through the vertex without intravenous contrast. COMPARISON:  CT brain scan of 09/15/2016 FINDINGS: Brain: The ventricular system remains prominent as are the cortical sulci and cerebellar folia, consistent  with diffuse atrophy. The septum remains midline in position. Moderately severe small vessel ischemic change remains throughout the periventricular white matter and small lacunar infarcts are noted. There is a questionable small lacunar infarct within the pons on images number 8 and 9, not definitely seen previously. MRI may be helpful if further assessment is warranted. No hemorrhage or mass lesion is seen. Vascular: No vascular abnormality is noted on this unenhanced study. Skull: On bone window images, no calvarial abnormality is noted. Sinuses/Orbits: There is mild mucosal thickening in the ethmoid air cells. No air-fluid level is seen. Other: Prior left eye surgery again noted. IMPRESSION: 1. Small low-attenuation within the right pons not definitely seen previously. Cannot exclude a small pontine lacunar infarct. Consider MRI if further assessment is warranted. No hemorrhage. 2. No change in moderately severe small vessel ischemic change throughout the periventricular white matter with stable old lacunar infarcts and diffuse atrophy. Electronically Signed   By: Ivar Drape M.D.   On: 03/16/2017 11:15   Ct Angio Neck W And/or Wo Contrast  Result Date: 03/16/2017 CLINICAL DATA:  Two episodes of complete vision loss yesterday. TIA. EXAM: CT ANGIOGRAPHY HEAD AND NECK TECHNIQUE: Multidetector CT imaging of the head and neck was performed using the standard protocol during bolus administration of intravenous contrast. Multiplanar CT image reconstructions and MIPs were obtained to evaluate the vascular anatomy. Carotid stenosis measurements (when applicable) are obtained utilizing NASCET criteria, using the distal internal carotid diameter as the denominator. CONTRAST:  75 cc Isovue 370 intravenous COMPARISON:  No comparison angiography FINDINGS: CTA NECK FINDINGS Aortic arch: Atherosclerosis diffusely. The brachiocephalic and left common carotid origins were not covered for evaluated. No acute finding. Right  carotid system: Smooth and widely patent. Minimal atheromatous changes at the bifurcation. Left carotid system: Smooth and widely patent. No notable atheromatous changes. Vertebral arteries: Atherosclerotic plaque at the origin of the left subclavian artery without stenosis or ulceration. Mild narrowing of the right vertebral artery at its origin due to noncalcified plaque. Negative for dissection. Skeleton: Advanced cervical facet arthropathy and disc degeneration. Advanced atlantodental degeneration. Other neck: Chronic sinusitis, especially the right maxillary antrum where there is atelectasis and secretions. 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 Upper chest: No acute finding Review of the MIP images confirms the above findings CTA HEAD FINDINGS Anterior circulation: Mild atheromatous changes on the widely patent carotid siphons. No major branch occlusion or reversible proximal flow limiting stenosis. Extensive atherosclerotic irregularity of bilateral medium sized vessels, with notably severe bilateral M2 and right A3 narrowings. There is a moderate narrowing involving the origin of the first branch right M1 segment. These are best seen on thick MIPS. Negative for aneurysm Posterior circulation: Left dominant vertebrobasilar system. Bilateral picas are widely patent. Basilar is smooth and widely patent. Hypoplastic right P1 segment. Diffuse atherosclerotic irregularity of bilateral PCAs. There is up to moderate bilateral P2 segment narrowing. Bilateral P3 and distal branch high-grade narrowings best seen on MIPS. Venous sinuses: Patent Anatomic variants: Fetal type right PCA. Delayed phase: No abnormal intracranial enhancement. Remote infarcts in the bilateral cerebellum and right pons based on priors. Extensive chronic microvascular ischemic change in the cerebral white matter. Review of the MIP images confirms the above findings IMPRESSION: 1. No emergent large vessel occlusion. 2. Extensive atherosclerosis of  medium size vessels in the anterior and posterior circulation with multifocal high-grade narrowing. 3. Mild atherosclerosis in the neck without stenosis or ulceration. 4. Partial coverage of the aortic arch. The brachiocephalic and left common carotid origins were not seen. 5. Remote bilateral cerebellar and right pontine infarcts. Extensive chronic small vessel ischemia in the cerebral white matter. Electronically Signed   By: Monte Fantasia M.D.   On: 03/16/2017 12:40    EKG: Orders placed or performed during the hospital encounter of 03/16/17  . EKG 12-Lead  . EKG 12-Lead  . ED EKG  . ED EKG    IMPRESSION AND PLAN: Patient is a 83 year old presenting with difficulty with vision in the right eye  1. Right eye vision loss Possible cva- continue aspirin Patient RD had a CT angiogram of the neck and had  2. GERD continue omeprazole  3. Glaucoma continue eyedrops  4. OsteoarthritisWhen necessary pain meds  5. Hyperlipidemia currently not on any therapy will check cholesterol morning     All the records are reviewed and case discussed with ED provider. Management plans discussed with the patient, family and they are in agreement.  CODE STATUS:    Code Status Orders        Start     Ordered   03/16/17 1528  Full code  Continuous     03/16/17 1527    Code Status History    Date Active Date Inactive Code Status Order ID Comments User Context   This patient has a current code status but no historical code status.       TOTAL TIME TAKING CARE OF THIS PATIENT:55 minutes.    Dustin Flock M.D on 03/16/2017 at 5:13 PM  Between 7am to 6pm - Pager - [phone]  After 6pm go to [email or web address] - password [email or web address] [facility]  Office  [phone]  CC: Primary care physician; Einar Pheasant, MD

## 2017-03-16 NOTE — Discharge Instructions (Signed)
Please make sure you continue taking your aspirin every day and make an appointment to see your primary care physician this coming Monday for a recheck. Return to the emergency department sooner for any concerns such as headache, numbness, weakness, or for any other issues.  It was a pleasure to take care of you today, and thank you for coming to our emergency department.  If you have any questions or concerns before leaving please ask the nurse to grab me and I'm more than happy to go through your aftercare instructions again.  If you were prescribed any opioid pain medication today such as Norco, Vicodin, Percocet, morphine, hydrocodone, or oxycodone please make sure you do not drive when you are taking this medication as it can alter your ability to drive safely.  If you have any concerns once you are home that you are not improving or are in fact getting worse before you can make it to your follow-up appointment, please do not hesitate to call 911 and come back for further evaluation.  Darel Hong, MD  Results for orders placed or performed during the hospital encounter of 03/16/17  Protime-INR  Result Value Ref Range   Prothrombin Time 12.8 11.4 - 15.2 seconds   INR 0.96   APTT  Result Value Ref Range   aPTT 28 24 - 36 seconds  CBC  Result Value Ref Range   WBC 7.6 3.6 - 11.0 K/uL   RBC 4.47 3.80 - 5.20 MIL/uL   Hemoglobin 13.7 12.0 - 16.0 g/dL   HCT 40.6 35.0 - 47.0 %   MCV 90.8 80.0 - 100.0 fL   MCH 30.6 26.0 - 34.0 pg   MCHC 33.7 32.0 - 36.0 g/dL   RDW 14.0 11.5 - 14.5 %   Platelets 276 150 - 440 K/uL  Differential  Result Value Ref Range   Neutrophils Relative % 78 %   Neutro Abs 6.0 1.4 - 6.5 K/uL   Lymphocytes Relative 13 %   Lymphs Abs 1.0 1.0 - 3.6 K/uL   Monocytes Relative 7 %   Monocytes Absolute 0.5 0.2 - 0.9 K/uL   Eosinophils Relative 1 %   Eosinophils Absolute 0.1 0 - 0.7 K/uL   Basophils Relative 1 %   Basophils Absolute 0.1 0 - 0.1 K/uL  Comprehensive  metabolic panel  Result Value Ref Range   Sodium 140 135 - 145 mmol/L   Potassium 4.3 3.5 - 5.1 mmol/L   Chloride 109 101 - 111 mmol/L   CO2 23 22 - 32 mmol/L   Glucose, Bld 128 (H) 65 - 99 mg/dL   BUN 21 (H) 6 - 20 mg/dL   Creatinine, Ser 0.71 0.44 - 1.00 mg/dL   Calcium 9.4 8.9 - 10.3 mg/dL   Total Protein 6.7 6.5 - 8.1 g/dL   Albumin 4.1 3.5 - 5.0 g/dL   AST 28 15 - 41 U/L   ALT 18 14 - 54 U/L   Alkaline Phosphatase 51 38 - 126 U/L   Total Bilirubin 0.7 0.3 - 1.2 mg/dL   GFR calc non Af Amer >60 >60 mL/min   GFR calc Af Amer >60 >60 mL/min   Anion gap 8 5 - 15  Troponin I  Result Value Ref Range   Troponin I <0.03 <0.03 ng/mL   Ct Angio Head W Or Wo Contrast  Result Date: 03/16/2017 CLINICAL DATA:  Two episodes of complete vision loss yesterday. TIA. EXAM: CT ANGIOGRAPHY HEAD AND NECK TECHNIQUE: Multidetector CT imaging of the  head and neck was performed using the standard protocol during bolus administration of intravenous contrast. Multiplanar CT image reconstructions and MIPs were obtained to evaluate the vascular anatomy. Carotid stenosis measurements (when applicable) are obtained utilizing NASCET criteria, using the distal internal carotid diameter as the denominator. CONTRAST:  75 cc Isovue 370 intravenous COMPARISON:  No comparison angiography FINDINGS: CTA NECK FINDINGS Aortic arch: Atherosclerosis diffusely. The brachiocephalic and left common carotid origins were not covered for evaluated. No acute finding. Right carotid system: Smooth and widely patent. Minimal atheromatous changes at the bifurcation. Left carotid system: Smooth and widely patent. No notable atheromatous changes. Vertebral arteries: Atherosclerotic plaque at the origin of the left subclavian artery without stenosis or ulceration. Mild narrowing of the right vertebral artery at its origin due to noncalcified plaque. Negative for dissection. Skeleton: Advanced cervical facet arthropathy and disc degeneration.  Advanced atlantodental degeneration. Other neck: Chronic sinusitis, especially the right maxillary antrum where there is atelectasis and secretions. 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 Upper chest: No acute finding Review of the MIP images confirms the above findings CTA HEAD FINDINGS Anterior circulation: Mild atheromatous changes on the widely patent carotid siphons. No major branch occlusion or reversible proximal flow limiting stenosis. Extensive atherosclerotic irregularity of bilateral medium sized vessels, with notably severe bilateral M2 and right A3 narrowings. There is a moderate narrowing involving the origin of the first branch right M1 segment. These are best seen on thick MIPS. Negative for aneurysm Posterior circulation: Left dominant vertebrobasilar system. Bilateral picas are widely patent. Basilar is smooth and widely patent. Hypoplastic right P1 segment. Diffuse atherosclerotic irregularity of bilateral PCAs. There is up to moderate bilateral P2 segment narrowing. Bilateral P3 and distal branch high-grade narrowings best seen on MIPS. Venous sinuses: Patent Anatomic variants: Fetal type right PCA. Delayed phase: No abnormal intracranial enhancement. Remote infarcts in the bilateral cerebellum and right pons based on priors. Extensive chronic microvascular ischemic change in the cerebral white matter. Review of the MIP images confirms the above findings IMPRESSION: 1. No emergent large vessel occlusion. 2. Extensive atherosclerosis of medium size vessels in the anterior and posterior circulation with multifocal high-grade narrowing. 3. Mild atherosclerosis in the neck without stenosis or ulceration. 4. Partial coverage of the aortic arch. The brachiocephalic and left common carotid origins were not seen. 5. Remote bilateral cerebellar and right pontine infarcts. Extensive chronic small vessel ischemia in the cerebral white matter. Electronically Signed   By: Monte Fantasia M.D.   On: 03/16/2017  12:40   Ct Head Wo Contrast  Result Date: 03/16/2017 CLINICAL DATA:  Possible transient ischemic attack, loss of vision yesterday EXAM: CT HEAD WITHOUT CONTRAST TECHNIQUE: Contiguous axial images were obtained from the base of the skull through the vertex without intravenous contrast. COMPARISON:  CT brain scan of 09/15/2016 FINDINGS: Brain: The ventricular system remains prominent as are the cortical sulci and cerebellar folia, consistent with diffuse atrophy. The septum remains midline in position. Moderately severe small vessel ischemic change remains throughout the periventricular white matter and small lacunar infarcts are noted. There is a questionable small lacunar infarct within the pons on images number 8 and 9, not definitely seen previously. MRI may be helpful if further assessment is warranted. No hemorrhage or mass lesion is seen. Vascular: No vascular abnormality is noted on this unenhanced study. Skull: On bone window images, no calvarial abnormality is noted. Sinuses/Orbits: There is mild mucosal thickening in the ethmoid air cells. No air-fluid level is seen. Other: Prior left eye surgery  again noted. IMPRESSION: 1. Small low-attenuation within the right pons not definitely seen previously. Cannot exclude a small pontine lacunar infarct. Consider MRI if further assessment is warranted. No hemorrhage. 2. No change in moderately severe small vessel ischemic change throughout the periventricular white matter with stable old lacunar infarcts and diffuse atrophy. Electronically Signed   By: Ivar Drape M.D.   On: 03/16/2017 11:15   Ct Angio Neck W And/or Wo Contrast  Result Date: 03/16/2017 CLINICAL DATA:  Two episodes of complete vision loss yesterday. TIA. EXAM: CT ANGIOGRAPHY HEAD AND NECK TECHNIQUE: Multidetector CT imaging of the head and neck was performed using the standard protocol during bolus administration of intravenous contrast. Multiplanar CT image reconstructions and MIPs were  obtained to evaluate the vascular anatomy. Carotid stenosis measurements (when applicable) are obtained utilizing NASCET criteria, using the distal internal carotid diameter as the denominator. CONTRAST:  75 cc Isovue 370 intravenous COMPARISON:  No comparison angiography FINDINGS: CTA NECK FINDINGS Aortic arch: Atherosclerosis diffusely. The brachiocephalic and left common carotid origins were not covered for evaluated. No acute finding. Right carotid system: Smooth and widely patent. Minimal atheromatous changes at the bifurcation. Left carotid system: Smooth and widely patent. No notable atheromatous changes. Vertebral arteries: Atherosclerotic plaque at the origin of the left subclavian artery without stenosis or ulceration. Mild narrowing of the right vertebral artery at its origin due to noncalcified plaque. Negative for dissection. Skeleton: Advanced cervical facet arthropathy and disc degeneration. Advanced atlantodental degeneration. Other neck: Chronic sinusitis, especially the right maxillary antrum where there is atelectasis and secretions. 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 Upper chest: No acute finding Review of the MIP images confirms the above findings CTA HEAD FINDINGS Anterior circulation: Mild atheromatous changes on the widely patent carotid siphons. No major branch occlusion or reversible proximal flow limiting stenosis. Extensive atherosclerotic irregularity of bilateral medium sized vessels, with notably severe bilateral M2 and right A3 narrowings. There is a moderate narrowing involving the origin of the first branch right M1 segment. These are best seen on thick MIPS. Negative for aneurysm Posterior circulation: Left dominant vertebrobasilar system. Bilateral picas are widely patent. Basilar is smooth and widely patent. Hypoplastic right P1 segment. Diffuse atherosclerotic irregularity of bilateral PCAs. There is up to moderate bilateral P2 segment narrowing. Bilateral P3 and distal  branch high-grade narrowings best seen on MIPS. Venous sinuses: Patent Anatomic variants: Fetal type right PCA. Delayed phase: No abnormal intracranial enhancement. Remote infarcts in the bilateral cerebellum and right pons based on priors. Extensive chronic microvascular ischemic change in the cerebral white matter. Review of the MIP images confirms the above findings IMPRESSION: 1. No emergent large vessel occlusion. 2. Extensive atherosclerosis of medium size vessels in the anterior and posterior circulation with multifocal high-grade narrowing. 3. Mild atherosclerosis in the neck without stenosis or ulceration. 4. Partial coverage of the aortic arch. The brachiocephalic and left common carotid origins were not seen. 5. Remote bilateral cerebellar and right pontine infarcts. Extensive chronic small vessel ischemia in the cerebral white matter. Electronically Signed   By: Monte Fantasia M.D.   On: 03/16/2017 12:40

## 2017-03-16 NOTE — ED Notes (Addendum)
First Nurse: pt sent over per pcp for further eval of possible tia. Pt ambulatory with cane,  No acute distress noted.

## 2017-03-16 NOTE — Progress Notes (Signed)
[facility] responded to request for Advance Directive. Patient was alert and active. Patient wanted to see Dr. Posey Pronto. Patient wanted to go for a walk. Patient stated that she needs to get the paperwork done and will do it later. Patient wanted to go for her walk. Patient stated she walks 30 minutes or more a day and needed to get up and move around. Patient is a very interesting woman with a lot of stories.    03/16/17 1745  Clinical Encounter Type  Visited With Patient;Health care provider  Visit Type Initial;Other (Comment)  Referral From Nurse (Advance Directive)

## 2017-03-16 NOTE — ED Triage Notes (Addendum)
Pt sent from optometrist for evaluation of possible TIA. Pt reported two episodes of complete vision loss yesterday at approximately 3:00 pm. Pt denies any pain or feeling unwell. Pt alert and oriented in triage. Bilateral strong hand grips, facial symmetry intact.

## 2017-03-16 NOTE — ED Notes (Signed)
Per first nurse, Brayton Layman, patient is being transported to CT at this time and will then come to room 7.

## 2017-03-17 ENCOUNTER — Inpatient Hospital Stay (HOSPITAL_BASED_OUTPATIENT_CLINIC_OR_DEPARTMENT_OTHER)
Admit: 2017-03-17 | Discharge: 2017-03-17 | Disposition: A | Discharge status: Home / Self Care | Payer: Medicare Other | Attending: Internal Medicine | Admitting: Internal Medicine

## 2017-03-17 DIAGNOSIS — I340 Nonrheumatic mitral (valve) insufficiency: Secondary | ICD-10-CM | POA: Diagnosis not present

## 2017-03-17 DIAGNOSIS — I351 Nonrheumatic aortic (valve) insufficiency: Secondary | ICD-10-CM | POA: Diagnosis not present

## 2017-03-17 DIAGNOSIS — I639 Cerebral infarction, unspecified: Secondary | ICD-10-CM | POA: Diagnosis not present

## 2017-03-17 DIAGNOSIS — G459 Transient cerebral ischemic attack, unspecified: Secondary | ICD-10-CM | POA: Diagnosis not present

## 2017-03-17 DIAGNOSIS — H409 Unspecified glaucoma: Secondary | ICD-10-CM | POA: Diagnosis not present

## 2017-03-17 DIAGNOSIS — M1990 Unspecified osteoarthritis, unspecified site: Secondary | ICD-10-CM | POA: Diagnosis not present

## 2017-03-17 DIAGNOSIS — K219 Gastro-esophageal reflux disease without esophagitis: Secondary | ICD-10-CM | POA: Diagnosis not present

## 2017-03-17 LAB — LIPID PANEL
Cholesterol: 207 mg/dL — ABNORMAL HIGH (ref 0–200)
HDL: 72 mg/dL (ref >40)
LDL Cholesterol: 116 mg/dL — ABNORMAL HIGH (ref 0–99)
Total CHOL/HDL Ratio: 2.9 RATIO
Triglycerides: 93 mg/dL (ref <150)
VLDL: 19 mg/dL (ref 0–40)

## 2017-03-17 LAB — ECHOCARDIOGRAM COMPLETE
HEIGHTINCHES: 64 in
WEIGHTICAEL: 1702.4 [oz_av]

## 2017-03-17 LAB — HEMOGLOBIN A1C
HEMOGLOBIN A1C: 5.4 % (ref 4.8–5.6)
MEAN PLASMA GLUCOSE: 108.28 mg/dL

## 2017-03-17 MED ORDER — ATORVASTATIN CALCIUM 40 MG PO TABS: 40.0000 mg | ORAL_TABLET | Freq: Every day | ORAL | 0 refills | Status: DC

## 2017-03-17 MED ORDER — ATORVASTATIN CALCIUM 20 MG PO TABS: 40.0000 mg | ORAL_TABLET | Freq: Every day | ORAL | Status: DC

## 2017-03-17 MED ORDER — CLOPIDOGREL BISULFATE 75 MG PO TABS: 75.0000 mg | ORAL_TABLET | Freq: Every day | ORAL | 0 refills | Status: DC

## 2017-03-17 MED ORDER — CHOLECALCIFEROL 10 MCG (400 UNIT) PO TABS: 400.0000 [IU] | ORAL_TABLET | Freq: Every day | ORAL | Status: DC

## 2017-03-17 NOTE — Evaluation (Signed)
Occupational Therapy Evaluation Patient Details Name: Christy Cisneros MRN: [id number] DOB: 1920-03-07 Today's Date: 03/17/2017    History of Present Illness Pt is a 83 y/o F who presented from opthalamologist due to blurry vision in the R eye which has since resolved. CT of the head showed pons lesion but MRI of the brain showed no acute changes.  Pt's PMH includes glaucoma, osteoporosis, TIA, chronic bronchitis, R RTC repair.   Clinical Impression   Patient seen for OT evaluation this date. Patient lives alone in an apartment and was previously independent in basic self care skills, she did have occasional assistance with homemaking tasks.   Patient presents with decreased functional balance, fall risk, min guard for toileting.  She would benefit from skilled OT to maximize safety and independence in daily tasks.     Follow Up Recommendations  Home health OT;Supervision/Assistance - 24 hour    Equipment Recommendations       Recommendations for Other Services       Precautions / Restrictions Precautions Precautions: Fall Restrictions Weight Bearing Restrictions: No      Mobility Bed Mobility Overal bed mobility: Independent             General bed mobility comments: No physical assist or cues needed.  Pt performs independently.  Transfers Overall transfer level: Needs assistance Equipment used:  (walking stick) Transfers: Sit to/from Stand Sit to Stand: Min guard         General transfer comment: When rising to standing from bed, pt stands and then controls descent to sit due to imbalance posteriorly.  Min guard for safety.    Balance Overall balance assessment: Needs assistance;History of Falls Sitting-balance support: No upper extremity supported;Feet supported Sitting balance-Leahy Scale: Good     Standing balance support: No upper extremity supported;During functional activity Standing balance-Leahy Scale: Fair Standing balance comment: Pt able to  stand statically without UE support but relies on UE support for dynamic activities                 Standardized Balance Assessment Standardized Balance Assessment : Berg Balance Test;Dynamic Gait Index Berg Balance Test Sit to Stand: Able to stand using hands after several tries Standing Unsupported: Able to stand safely 2 minutes Sitting with Back Unsupported but Feet Supported on Floor or Stool: Able to sit safely and securely 2 minutes Stand to Sit: Sits safely with minimal use of hands Transfers: Able to transfer safely, definite need of hands Standing Unsupported with Eyes Closed: Able to stand 10 seconds with supervision Standing Ubsupported with Feet Together: Able to place feet together independently and stand for 1 minute with supervision From Standing, Reach Forward with Outstretched Arm: Can reach forward >12 cm safely (5") From Standing Position, Pick up Object from Floor: Able to pick up shoe, needs supervision From Standing Position, Turn to Look Behind Over each Shoulder: Turn sideways only but maintains balance Turn 360 Degrees: Needs close supervision or verbal cueing Standing Unsupported, Alternately Place Feet on Step/Stool: Needs assistance to keep from falling or unable to try Standing Unsupported, One Foot in Front: Loses balance while stepping or standing Standing on One Leg: Unable to try or needs assist to prevent fall Total Score: 32 Dynamic Gait Index Level Surface: Mild Impairment Change in Gait Speed: Normal Gait with Horizontal Head Turns: Mild Impairment Gait with Vertical Head Turns: Mild Impairment Gait and Pivot Turn: Mild Impairment Step Over Obstacle: Normal Step Around Obstacles: Normal     ADL either performed  or assessed with clinical judgement   ADL Overall ADL's : Needs assistance/impaired Eating/Feeding: Modified independent   Grooming: Modified independent   Upper Body Bathing: Modified independent   Lower Body Bathing:  Modified independent   Upper Body Dressing : Modified independent   Lower Body Dressing: Modified independent   Toilet Transfer: Min guard   Toileting- Clothing Manipulation and Hygiene: Modified independent       Functional mobility during ADLs: Min guard General ADL Comments: Patient with decreased functional balance which puts her at a fall risk and may impact her self care and functional mobility.  Recommend min guard when up OOB     Vision   Additional Comments: Patient reports she has a history of left sided vision loss     Perception     Praxis      Pertinent Vitals/Pain Pain Assessment: No/denies pain     Hand Dominance Right   Extremity/Trunk Assessment Upper Extremity Assessment Upper Extremity Assessment: Generalized weakness   Lower Extremity Assessment Lower Extremity Assessment: Defer to PT evaluation   Cervical / Trunk Assessment Cervical / Trunk Assessment: Kyphotic   Communication Communication Communication: HOH   Cognition Arousal/Alertness: Awake/alert Behavior During Therapy: WFL for tasks assessed/performed Overall Cognitive Status: No family/caregiver present to determine baseline cognitive functioning                                 General Comments: Pt with short term memory deficits.  No family present to report pt's baseline.  Her converstation is often tangential and off topic and she is very talkative and difficult to remain on task.   General Comments  Pt scored 32/56 on the [facility] Test, indicating she is at an increased risk of falling.  Results explained to the pt.    Exercises     Shoulder Instructions      Home Living Family/patient expects to be discharged to:: Private residence Living Arrangements: Alone Available Help at Discharge: Neighbor;Friend(s);Available PRN/intermittently Type of Home: Apartment Home Access: Stairs to enter Entrance Stairs-Number of Steps: 3 Entrance Stairs-Rails:  Left Home Layout: One level     Bathroom Shower/Tub: Teacher, early years/pre: Standard     Home Equipment:  (walking stick)          Prior Functioning/Environment Level of Independence: Independent with assistive device(s)        Comments: Pt ambulates without AD in home, with walking stick in community.  Pt reports 1 fall in the past 6 months.  She goes for a walk every day that lasts ~30 mintues.  She is still cooking and using her oven.  Takes baths independently, dresses independently.  Neighbor comes to check on her a few times during the day.  Has friends drive her places as she is no longer driving.         OT Problem List: Decreased strength;Impaired balance (sitting and/or standing)      OT Treatment/Interventions: Self-care/ADL training;Balance training;Patient/family education    OT Goals(Current goals can be found in the care plan section) Acute Rehab OT Goals Patient Stated Goal: to go home OT Goal Formulation: With patient Time For Goal Achievement: 03/25/17 Potential to Achieve Goals: Good  OT Frequency: Min 1X/week   Barriers to D/C:            Co-evaluation              AM-PAC PT "6 Clicks"  Daily Activity     Outcome Measure Help from another person eating meals?: None Help from another person taking care of personal grooming?: None Help from another person toileting, which includes using toliet, bedpan, or urinal?: A Little Help from another person bathing (including washing, rinsing, drying)?: None Help from another person to put on and taking off regular upper body clothing?: None Help from another person to put on and taking off regular lower body clothing?: None 6 Click Score: 23   End of Session Equipment Utilized During Treatment: Gait belt  Activity Tolerance: Patient tolerated treatment well Patient left: in bed;with call bell/phone within reach;with family/visitor present  OT Visit Diagnosis: Unsteadiness on feet  (R26.81);Muscle weakness (generalized) (M62.81)                Time: 1432-1450 OT Time Calculation (min): 18 min Charges:  OT General Charges $OT Visit: 1 Procedure OT Evaluation $OT Eval Low Complexity: 1 Procedure G-Codes: OT G-codes **NOT FOR INPATIENT CLASS** Functional Assessment Tool Used: AM-PAC 6 Clicks Daily Activity Functional Limitation: Self care Self Care Current Status (V1504): At least 1 percent but less than 20 percent impaired, limited or restricted Self Care Goal Status (H3643): 0 percent impaired, limited or restricted   Amy T Lovett, OTR/L, CLT   Lovett,Amy 03/17/2017, 3:19 PM

## 2017-03-17 NOTE — Care Management Note (Signed)
Case Management Note  Patient Details  Name: Christy Cisneros MRN: [id number] Date of Birth: [birth year]  Subjective/Objective:       Admitted to [facility] with the diagnosis of CVA. Lives alone. Friend is Freddrick [phone]). Friend is Rolanda Lundborg [phone]). Goes to see Dr. Einar Pheasant every 6 months. Prescriptions are filled at [facility]. No home Health. [facility] in the past. No home oxygen. Uses a cane to aid in ambulation. Takes care of all basic activities of daily living herself, doesn't drive. No falls. Good appetite. Friend will transport.             Action/Plan: Physical therapy evaluation completed. Recommending Home Health and physical therapy in the home and supervision. Spoke with neighbor, Manuela Schwartz. States she will check on her. [facility] information given.  Will add social worker   Expected Discharge Date:  03/17/17               Expected Discharge Plan:     In-House Referral:     Discharge planning Services     Post Acute Care Choice:    Choice offered to:     DME Arranged:    DME Agency:     HH Arranged:   yes HH Agency:   [facility]  Status of Service:     If discussed at [place] of Stay Meetings, dates discussed:    Additional Comments:  Shelbie Ammons, RN 03/17/2017, 3:48 PM

## 2017-03-17 NOTE — Evaluation (Signed)
Physical Therapy Evaluation Patient Details Name: Christy Cisneros MRN: [id number] DOB: 11/04/1919 Today's Date: 03/17/2017   History of Present Illness  Pt is a 83 y/o F who presented from opthalamologist due to blurry vision in the R eye which has since resolved. CT of the head showed pons lesion but MRI of the brain showed no acute changes.  Pt's PMH includes glaucoma, osteoporosis, TIA, chronic bronchitis, R RTC repair.    Clinical Impression  Pt admitted with above diagnosis. Pt currently with functional limitations due to the deficits listed below (see PT Problem List). Ms. Paull was very pleasant but presents with short term memory deficits and conversation that is often tangential.  She requires cues to remain focused on task.  She currently requires min guard assist for safety with transfers and ambulation due to instability.  She has h/o 1 fall in the past 6 months, per pt report.  She lives alone and has friends who check on her throughout the day. Pt scored 32/56 on the [facility] Test, indicating she is at an increased risk of falling.  Results explained to the pt.  Recommending 24/7 supervision/assist at d/c due to high fall risk. CM reports she will provide pt with information regarding life alert button.  Pt will benefit from skilled PT to increase their independence and safety with mobility to allow discharge to the venue listed below.      Follow Up Recommendations Home health PT;Supervision/Assistance - 24 hour    Equipment Recommendations  None recommended by PT    Recommendations for Other Services       Precautions / Restrictions Precautions Precautions: Fall Restrictions Weight Bearing Restrictions: No      Mobility  Bed Mobility Overal bed mobility: Independent             General bed mobility comments: No physical assist or cues needed.  Pt performs independently.  Transfers Overall transfer level: Needs assistance Equipment used:  (walking  stick) Transfers: Sit to/from Stand Sit to Stand: Min guard         General transfer comment: When rising to standing from bed, pt stands and then controls descent to sit due to imbalance posteriorly.  Min guard for safety.  Ambulation/Gait Ambulation/Gait assistance: Min guard Ambulation Distance (Feet): 250 Feet Assistive device:  (walking stick) Gait Pattern/deviations: Decreased stride length;Trunk flexed   Gait velocity interpretation: at or above normal speed for age/gender General Gait Details: Flexed trunk and pt easily distracted and stops when she is talking.  Instability with sudden stops and vertical and horizontal head turns.    Stairs            Wheelchair Mobility    Modified Rankin (Stroke Patients Only)       Balance Overall balance assessment: Needs assistance;History of Falls Sitting-balance support: No upper extremity supported;Feet supported Sitting balance-Leahy Scale: Good     Standing balance support: No upper extremity supported;During functional activity Standing balance-Leahy Scale: Fair Standing balance comment: Pt able to stand statically without UE support but relies on UE support for dynamic activities                 Standardized Balance Assessment Standardized Balance Assessment : Berg Balance Test;Dynamic Gait Index Berg Balance Test Sit to Stand: Able to stand using hands after several tries Standing Unsupported: Able to stand safely 2 minutes Sitting with Back Unsupported but Feet Supported on Floor or Stool: Able to sit safely and securely 2 minutes Stand to Sit:  Sits safely with minimal use of hands Transfers: Able to transfer safely, definite need of hands Standing Unsupported with Eyes Closed: Able to stand 10 seconds with supervision Standing Ubsupported with Feet Together: Able to place feet together independently and stand for 1 minute with supervision From Standing, Reach Forward with Outstretched Arm: Can reach  forward >12 cm safely (5") From Standing Position, Pick up Object from Floor: Able to pick up shoe, needs supervision From Standing Position, Turn to Look Behind Over each Shoulder: Turn sideways only but maintains balance Turn 360 Degrees: Needs close supervision or verbal cueing Standing Unsupported, Alternately Place Feet on Step/Stool: Needs assistance to keep from falling or unable to try Standing Unsupported, One Foot in Front: Loses balance while stepping or standing Standing on One Leg: Unable to try or needs assist to prevent fall Total Score: 32 Dynamic Gait Index Level Surface: Mild Impairment Change in Gait Speed: Normal Gait with Horizontal Head Turns: Mild Impairment Gait with Vertical Head Turns: Mild Impairment Gait and Pivot Turn: Mild Impairment Step Over Obstacle: Normal Step Around Obstacles: Normal       Pertinent Vitals/Pain Pain Assessment: No/denies pain    Home Living Family/patient expects to be discharged to:: Private residence Living Arrangements: Alone Available Help at Discharge: Neighbor;Friend(s);Available PRN/intermittently Type of Home: Apartment Home Access: Stairs to enter Entrance Stairs-Rails: Left Entrance Stairs-Number of Steps: 3 Home Layout: One level Home Equipment:  (walking stick)      Prior Function Level of Independence: Independent with assistive device(s)         Comments: Pt ambulates without AD in home, with walking stick in community.  Pt reports 1 fall in the past 6 months.  She goes for a walk every day that lasts ~30 mintues.  She is still cooking and using her oven.  Takes baths independently, dresses independently.  Neighbor comes to check on her a few times during the day.  Has friends drive her places as she is no longer driving.      Hand Dominance        Extremity/Trunk Assessment   Upper Extremity Assessment Upper Extremity Assessment:  (BUE strength grossly 3+/5)    Lower Extremity Assessment Lower  Extremity Assessment:  (BLE strength grossly 3+/5)    Cervical / Trunk Assessment Cervical / Trunk Assessment: Kyphotic  Communication   Communication: HOH  Cognition Arousal/Alertness: Awake/alert Behavior During Therapy: WFL for tasks assessed/performed Overall Cognitive Status: No family/caregiver present to determine baseline cognitive functioning                                 General Comments: Pt with short term memory deficits.  No family present to report pt's baseline.  Her converstation is often tangential and off topic and she is very talkative and difficult to remain on task.      General Comments General comments (skin integrity, edema, etc.): Pt scored 32/56 on the Berg Balance Test, indicating she is at an increased risk of falling.  Results explained to the pt.    Exercises     Assessment/Plan    PT Assessment Patient needs continued PT services  PT Problem List Decreased strength;Decreased balance;Decreased cognition;Decreased safety awareness       PT Treatment Interventions DME instruction;Gait training;Stair training;Functional mobility training;Therapeutic activities;Therapeutic exercise;Balance training;Neuromuscular re-education;Cognitive remediation;Patient/family education    PT Goals (Current goals can be found in the Care Plan section)  Acute Rehab PT Goals  Patient Stated Goal: to go home PT Goal Formulation: With patient Time For Goal Achievement: 03/31/17 Potential to Achieve Goals: Good    Frequency Min 2X/week   Barriers to discharge Decreased caregiver support;Inaccessible home environment Steps to enter home and 24/7 assist/supervision not available per pt report    Co-evaluation               AM-PAC PT "6 Clicks" Daily Activity  Outcome Measure Difficulty turning over in bed (including adjusting bedclothes, sheets and blankets)?: None Difficulty moving from lying on back to sitting on the side of the bed? :  None Difficulty sitting down on and standing up from a chair with arms (e.g., wheelchair, bedside commode, etc,.)?: A Little Help needed moving to and from a bed to chair (including a wheelchair)?: A Little Help needed walking in hospital room?: A Little Help needed climbing 3-5 steps with a railing? : A Little 6 Click Score: 20    End of Session Equipment Utilized During Treatment: Gait belt Activity Tolerance: Patient tolerated treatment well Patient left: in chair;with Cisneros bell/phone within reach;with chair alarm set;with nursing/sitter in room Nurse Communication: Mobility status PT Visit Diagnosis: Unsteadiness on feet (R26.81);History of falling (Z91.81);Muscle weakness (generalized) (M62.81)    Time: 7371-0626 PT Time Calculation (min) (ACUTE ONLY): 28 min   Charges:   PT Evaluation $PT Eval Low Complexity: 1 Low PT Treatments $Gait Training: 8-22 mins   PT G Codes:   PT G-Codes **NOT FOR INPATIENT CLASS** Functional Assessment Tool Used: AM-PAC 6 Clicks Basic Mobility;Clinical judgement;Berg Functional Limitation: Mobility: Walking and moving around Mobility: Walking and Moving Around Current Status (R4854): At least 20 percent but less than 40 percent impaired, limited or restricted Mobility: Walking and Moving Around Goal Status [phone]): At least 1 percent but less than 20 percent impaired, limited or restricted    Collie Siad PT, DPT 03/17/2017, 12:40 PM

## 2017-03-17 NOTE — Progress Notes (Signed)
*  PRELIMINARY RESULTS* Echocardiogram 2D Echocardiogram has been performed.  Christy Cisneros 03/17/2017, 10:29 AM

## 2017-03-17 NOTE — Progress Notes (Signed)
Discharge instructions given and went over with patient and patients neighbor Manuela Schwartz. All questions answered. Patient discharged home with home health via wheelchair by nursing staff. Madlyn Frankel, RN

## 2017-03-17 NOTE — Progress Notes (Signed)
PT Cancellation Note  Patient Details Name: Christy Cisneros MRN: [id number] DOB: [birth year]   Cancelled Treatment:    Reason Eval/Treat Not Completed: Patient at procedure or test/unavailable (Pt currently having Echo).  Will attempt to see pt again later today.   Collie Siad PT, DPT 03/17/2017, 9:55 AM

## 2017-03-17 NOTE — Care Management CC44 (Signed)
Condition Code 44 Documentation Completed  Patient Details  Name: MORAIMA BURD MRN: [id number] Date of Birth: 09-06-1919   Condition Code 44 given:  Yes Patient signature on Condition Code 44 notice:  Yes Documentation of 2 MD's agreement:  Yes Code 44 added to claim:  Yes    Shelbie Ammons, RN 03/17/2017, 3:22 PM

## 2017-03-17 NOTE — Progress Notes (Signed)
Subjective: Patient reports doing well overnight.  Does not report any new neurological complaints.    Objective: Current vital signs: BP (!) 173/72 (BP Location: Right Arm)   Pulse 84   Temp 98.7 F (37.1 C)   Resp 18   Ht 5\' 4"  (1.626 m)   Wt 48.3 kg (106 lb 6.4 oz)   SpO2 100%   BMI 18.26 kg/m  Vital signs in last 24 hours: Temp:  [97.5 F (36.4 C)-98.7 F (37.1 C)] 98.7 F (37.1 C) (08/17 0812) Pulse Rate:  [57-84] 84 (08/17 0812) Resp:  [14-18] 18 (08/17 0812) BP: (135-173)/(71-90) 173/72 (08/17 0812) SpO2:  [96 %-100 %] 100 % (08/17 0812) Weight:  [48.3 kg (106 lb 6.4 oz)] 48.3 kg (106 lb 6.4 oz) (08/16 1652)  Intake/Output from previous day: 08/16 0701 - 08/17 0700 In: 240 [P.O.:240] Out: -  Intake/Output this shift: Total I/O In: 240 [P.O.:240] Out: -  Nutritional status: Diet Heart Room service appropriate? Yes; Fluid consistency: Thin  Neurologic Exam: Mental Status: Alert.  Speech fluent without evidence of aphasia.  Able to follow 3 step commands without difficulty. Cranial Nerves: II: Discs flat bilaterally; Visual fields grossly normal in the right eye.  Blind in the left eye.  Right pupil reactive.  Left pupil fixed.   III,IV, VI: left ptosis, extra-ocular motions intact bilaterally V,VII: mild decrease in the right NLF, facial light touch sensation normal bilaterally VIII: hearing normal bilaterally IX,X: gag reflex present XI: bilateral shoulder shrug XII: midline tongue extension Motor: Right :  Upper extremity   5/5                                      Left:     Upper extremity   5/5             Lower extremity   5/5                                                  Lower extremity   5/5 Tone and bulk:normal tone throughout; no atrophy noted   Lab Results: Basic Metabolic Panel:  Recent Labs Lab 03/16/17 1029  NA 140  K 4.3  CL 109  CO2 23  GLUCOSE 128*  BUN 21*  CREATININE 0.71  CALCIUM 9.4    Liver Function Tests:  Recent  Labs Lab 03/16/17 1029  AST 28  ALT 18  ALKPHOS 51  BILITOT 0.7  PROT 6.7  ALBUMIN 4.1   No results for input(s): LIPASE, AMYLASE in the last 168 hours. No results for input(s): AMMONIA in the last 168 hours.  CBC:  Recent Labs Lab 03/16/17 1029  WBC 7.6  NEUTROABS 6.0  HGB 13.7  HCT 40.6  MCV 90.8  PLT 276    Cardiac Enzymes:  Recent Labs Lab 03/16/17 1029  TROPONINI <0.03    Lipid Panel:  Recent Labs Lab 03/17/17 0317  CHOL 207*  TRIG 93  HDL 72  CHOLHDL 2.9  VLDL 19  LDLCALC 116*    CBG: No results for input(s): GLUCAP in the last 168 hours.  Microbiology: Results for orders placed or performed in visit on 02/07/17  Urine Culture     Status: None   Collection Time: 02/07/17  4:03 PM  Result  Value Ref Range Status   Organism ID, Bacteria NO GROWTH  Final    Coagulation Studies:  Recent Labs  03/16/17 1029  LABPROT 12.8  INR 0.96    Imaging: Ct Angio Head W Or Wo Contrast  Result Date: 03/16/2017 CLINICAL DATA:  Two episodes of complete vision loss yesterday. TIA. EXAM: CT ANGIOGRAPHY HEAD AND NECK TECHNIQUE: Multidetector CT imaging of the head and neck was performed using the standard protocol during bolus administration of intravenous contrast. Multiplanar CT image reconstructions and MIPs were obtained to evaluate the vascular anatomy. Carotid stenosis measurements (when applicable) are obtained utilizing NASCET criteria, using the distal internal carotid diameter as the denominator. CONTRAST:  75 cc Isovue 370 intravenous COMPARISON:  No comparison angiography FINDINGS: CTA NECK FINDINGS Aortic arch: Atherosclerosis diffusely. The brachiocephalic and left common carotid origins were not covered for evaluated. No acute finding. Right carotid system: Smooth and widely patent. Minimal atheromatous changes at the bifurcation. Left carotid system: Smooth and widely patent. No notable atheromatous changes. Vertebral arteries: Atherosclerotic  plaque at the origin of the left subclavian artery without stenosis or ulceration. Mild narrowing of the right vertebral artery at its origin due to noncalcified plaque. Negative for dissection. Skeleton: Advanced cervical facet arthropathy and disc degeneration. Advanced atlantodental degeneration. Other neck: Chronic sinusitis, especially the right maxillary antrum where there is atelectasis and secretions. 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 Upper chest: No acute finding Review of the MIP images confirms the above findings CTA HEAD FINDINGS Anterior circulation: Mild atheromatous changes on the widely patent carotid siphons. No major branch occlusion or reversible proximal flow limiting stenosis. Extensive atherosclerotic irregularity of bilateral medium sized vessels, with notably severe bilateral M2 and right A3 narrowings. There is a moderate narrowing involving the origin of the first branch right M1 segment. These are best seen on thick MIPS. Negative for aneurysm Posterior circulation: Left dominant vertebrobasilar system. Bilateral picas are widely patent. Basilar is smooth and widely patent. Hypoplastic right P1 segment. Diffuse atherosclerotic irregularity of bilateral PCAs. There is up to moderate bilateral P2 segment narrowing. Bilateral P3 and distal branch high-grade narrowings best seen on MIPS. Venous sinuses: Patent Anatomic variants: Fetal type right PCA. Delayed phase: No abnormal intracranial enhancement. Remote infarcts in the bilateral cerebellum and right pons based on priors. Extensive chronic microvascular ischemic change in the cerebral white matter. Review of the MIP images confirms the above findings IMPRESSION: 1. No emergent large vessel occlusion. 2. Extensive atherosclerosis of medium size vessels in the anterior and posterior circulation with multifocal high-grade narrowing. 3. Mild atherosclerosis in the neck without stenosis or ulceration. 4. Partial coverage of the aortic  arch. The brachiocephalic and left common carotid origins were not seen. 5. Remote bilateral cerebellar and right pontine infarcts. Extensive chronic small vessel ischemia in the cerebral white matter. Electronically Signed   By: Monte Fantasia M.D.   On: 03/16/2017 12:40   Ct Head Wo Contrast  Result Date: 03/16/2017 CLINICAL DATA:  Possible transient ischemic attack, loss of vision yesterday EXAM: CT HEAD WITHOUT CONTRAST TECHNIQUE: Contiguous axial images were obtained from the base of the skull through the vertex without intravenous contrast. COMPARISON:  CT brain scan of 09/15/2016 FINDINGS: Brain: The ventricular system remains prominent as are the cortical sulci and cerebellar folia, consistent with diffuse atrophy. The septum remains midline in position. Moderately severe small vessel ischemic change remains throughout the periventricular white matter and small lacunar infarcts are noted. There is a questionable small lacunar infarct within the  pons on images number 8 and 9, not definitely seen previously. MRI may be helpful if further assessment is warranted. No hemorrhage or mass lesion is seen. Vascular: No vascular abnormality is noted on this unenhanced study. Skull: On bone window images, no calvarial abnormality is noted. Sinuses/Orbits: There is mild mucosal thickening in the ethmoid air cells. No air-fluid level is seen. Other: Prior left eye surgery again noted. IMPRESSION: 1. Small low-attenuation within the right pons not definitely seen previously. Cannot exclude a small pontine lacunar infarct. Consider MRI if further assessment is warranted. No hemorrhage. 2. No change in moderately severe small vessel ischemic change throughout the periventricular white matter with stable old lacunar infarcts and diffuse atrophy. Electronically Signed   By: Ivar Drape M.D.   On: 03/16/2017 11:15   Ct Angio Neck W And/or Wo Contrast  Result Date: 03/16/2017 CLINICAL DATA:  Two episodes of complete  vision loss yesterday. TIA. EXAM: CT ANGIOGRAPHY HEAD AND NECK TECHNIQUE: Multidetector CT imaging of the head and neck was performed using the standard protocol during bolus administration of intravenous contrast. Multiplanar CT image reconstructions and MIPs were obtained to evaluate the vascular anatomy. Carotid stenosis measurements (when applicable) are obtained utilizing NASCET criteria, using the distal internal carotid diameter as the denominator. CONTRAST:  75 cc Isovue 370 intravenous COMPARISON:  No comparison angiography FINDINGS: CTA NECK FINDINGS Aortic arch: Atherosclerosis diffusely. The brachiocephalic and left common carotid origins were not covered for evaluated. No acute finding. Right carotid system: Smooth and widely patent. Minimal atheromatous changes at the bifurcation. Left carotid system: Smooth and widely patent. No notable atheromatous changes. Vertebral arteries: Atherosclerotic plaque at the origin of the left subclavian artery without stenosis or ulceration. Mild narrowing of the right vertebral artery at its origin due to noncalcified plaque. Negative for dissection. Skeleton: Advanced cervical facet arthropathy and disc degeneration. Advanced atlantodental degeneration. Other neck: Chronic sinusitis, especially the right maxillary antrum where there is atelectasis and secretions. 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 Upper chest: No acute finding Review of the MIP images confirms the above findings CTA HEAD FINDINGS Anterior circulation: Mild atheromatous changes on the widely patent carotid siphons. No major branch occlusion or reversible proximal flow limiting stenosis. Extensive atherosclerotic irregularity of bilateral medium sized vessels, with notably severe bilateral M2 and right A3 narrowings. There is a moderate narrowing involving the origin of the first branch right M1 segment. These are best seen on thick MIPS. Negative for aneurysm Posterior circulation: Left dominant  vertebrobasilar system. Bilateral picas are widely patent. Basilar is smooth and widely patent. Hypoplastic right P1 segment. Diffuse atherosclerotic irregularity of bilateral PCAs. There is up to moderate bilateral P2 segment narrowing. Bilateral P3 and distal branch high-grade narrowings best seen on MIPS. Venous sinuses: Patent Anatomic variants: Fetal type right PCA. Delayed phase: No abnormal intracranial enhancement. Remote infarcts in the bilateral cerebellum and right pons based on priors. Extensive chronic microvascular ischemic change in the cerebral white matter. Review of the MIP images confirms the above findings IMPRESSION: 1. No emergent large vessel occlusion. 2. Extensive atherosclerosis of medium size vessels in the anterior and posterior circulation with multifocal high-grade narrowing. 3. Mild atherosclerosis in the neck without stenosis or ulceration. 4. Partial coverage of the aortic arch. The brachiocephalic and left common carotid origins were not seen. 5. Remote bilateral cerebellar and right pontine infarcts. Extensive chronic small vessel ischemia in the cerebral white matter. Electronically Signed   By: Monte Fantasia M.D.   On: 03/16/2017 12:40  Mr Brain Wo Contrast  Result Date: 03/17/2017 CLINICAL DATA:  Blurry vision, follow-up pontine stroke. History of TIA, probable dementia. EXAM: MRI HEAD WITHOUT CONTRAST MRA HEAD WITHOUT CONTRAST TECHNIQUE: Multiplanar, multiecho pulse sequences of the brain and surrounding structures were obtained without intravenous contrast. Angiographic images of the head were obtained using MRA technique without contrast. COMPARISON:  CT head March 16, 2017 at 1058 hours and CTA HEAD March 16, 2017 at 1207 hours and MRI head Dec 06, 2012 FINDINGS: MRI HEAD FINDINGS BRAIN: No reduced diffusion to suggest acute ischemia with particular attention to RIGHT pons. No susceptibility artifact to suggest hemorrhage. The ventricles and sulci are normal for  patient's age. Old bilateral basal ganglia, RIGHT pontine lacunar infarct. Prominent perivascular spaces seen with chronic small vessel ischemic disease. Patchy to confluent supratentorial white matter T2 hyperintensities without midline shift, mass effect or masses. No abnormal extra-axial fluid collections. VASCULAR: Normal major intracranial vascular flow voids present at skull base. SKULL AND UPPER CERVICAL SPINE: No abnormal sellar expansion. No suspicious calvarial bone marrow signal. Craniocervical junction maintained. SINUSES/ORBITS: Atretic RIGHT maxillary sinus with mucosal thickening compatible with chronic sinusitis. LEFT mastoid effusion. The included ocular globes and orbital contents are non-suspicious. Status post bilateral ocular lens implants. OTHER: None. MRA HEAD FINDINGS ANTERIOR CIRCULATION: Normal flow related enhancement of the included cervical, petrous, cavernous and supraclinoid internal carotid arteries. Patent anterior communicating artery. Patent anterior and middle cerebral arteries, including distal segments. Symmetric signal loss at trifurcation due to tortuosity. No large vessel occlusion, aneurysm. POSTERIOR CIRCULATION: LEFT vertebral artery is dominant. Basilar artery is patent, with normal flow related enhancement of the main branch vessels. Patent posterior cerebral arteries, symmetric signal loss associated with tortuosity, normal caliber. Robust RIGHT posterior communicating artery No large vessel occlusion, aneurysm. ANATOMIC VARIANTS: None. Source images and MIP images were reviewed. IMPRESSION: MRI HEAD: 1. No acute intracranial process. No acute infarct with particular attention to RIGHT pons. 2. Numerous old lacunar lacunar infarcts. Moderate to severe chronic small vessel ischemic disease. MRA HEAD: 1. No emergent large vessel occlusion. Known stenoses better documented on today's CTA HEAD. Electronically Signed   By: Elon Alas M.D.   On: 03/17/2017 00:25   Mr  Jodene Nam Head/brain WU Cm  Result Date: 03/17/2017 CLINICAL DATA:  Blurry vision, follow-up pontine stroke. History of TIA, probable dementia. EXAM: MRI HEAD WITHOUT CONTRAST MRA HEAD WITHOUT CONTRAST TECHNIQUE: Multiplanar, multiecho pulse sequences of the brain and surrounding structures were obtained without intravenous contrast. Angiographic images of the head were obtained using MRA technique without contrast. COMPARISON:  CT head March 16, 2017 at 1058 hours and CTA HEAD March 16, 2017 at 1207 hours and MRI head Dec 06, 2012 FINDINGS: MRI HEAD FINDINGS BRAIN: No reduced diffusion to suggest acute ischemia with particular attention to RIGHT pons. No susceptibility artifact to suggest hemorrhage. The ventricles and sulci are normal for patient's age. Old bilateral basal ganglia, RIGHT pontine lacunar infarct. Prominent perivascular spaces seen with chronic small vessel ischemic disease. Patchy to confluent supratentorial white matter T2 hyperintensities without midline shift, mass effect or masses. No abnormal extra-axial fluid collections. VASCULAR: Normal major intracranial vascular flow voids present at skull base. SKULL AND UPPER CERVICAL SPINE: No abnormal sellar expansion. No suspicious calvarial bone marrow signal. Craniocervical junction maintained. SINUSES/ORBITS: Atretic RIGHT maxillary sinus with mucosal thickening compatible with chronic sinusitis. LEFT mastoid effusion. The included ocular globes and orbital contents are non-suspicious. Status post bilateral ocular lens implants. OTHER: None. MRA HEAD FINDINGS ANTERIOR CIRCULATION:  Normal flow related enhancement of the included cervical, petrous, cavernous and supraclinoid internal carotid arteries. Patent anterior communicating artery. Patent anterior and middle cerebral arteries, including distal segments. Symmetric signal loss at trifurcation due to tortuosity. No large vessel occlusion, aneurysm. POSTERIOR CIRCULATION: LEFT vertebral artery is  dominant. Basilar artery is patent, with normal flow related enhancement of the main branch vessels. Patent posterior cerebral arteries, symmetric signal loss associated with tortuosity, normal caliber. Robust RIGHT posterior communicating artery No large vessel occlusion, aneurysm. ANATOMIC VARIANTS: None. Source images and MIP images were reviewed. IMPRESSION: MRI HEAD: 1. No acute intracranial process. No acute infarct with particular attention to RIGHT pons. 2. Numerous old lacunar lacunar infarcts. Moderate to severe chronic small vessel ischemic disease. MRA HEAD: 1. No emergent large vessel occlusion. Known stenoses better documented on today's CTA HEAD. Electronically Signed   By: Elon Alas M.D.   On: 03/17/2017 00:25    Medications:  I have reviewed the patient's current medications. Scheduled: . aspirin EC  81 mg Oral Daily  . atorvastatin  40 mg Oral q1800  . calcium carbonate  625 mg Oral Q breakfast  . enoxaparin (LOVENOX) injection  40 mg Subcutaneous Q24H  . fluticasone  2 spray Each Nare Daily  . pantoprazole  40 mg Oral Daily  . prednisoLONE acetate  1 drop Left Eye BID  . tetracaine  1 drop Both Eyes Once  . timolol  1 drop Left Eye BID    Assessment/Plan: No new neurological complaints.  MRI of the brain reviewed and shows no acute changes.  No afib noted on monitor.  History does suggest TIA.  Echocardiogram pending.  A1c 5.4, LDL 116.  Recommendations: 1.  ASA 81mg , Plavix 75mg  daily 2.  Statin for lipid management with target LDL<70. 3.  Patient to follow up with neurology on an outpatient basis.     LOS: 1 day   Alexis Goodell, MD Neurology [phone] 03/17/2017  12:17 PM

## 2017-03-17 NOTE — Progress Notes (Signed)
SLP Cancellation Note  Patient Details Name: Christy Cisneros MRN: [id number] DOB: [birth year]   Cancelled treatment:       Reason Eval/Treat Not Completed: SLP screened, no needs identified, will sign off (chart reviewed; consulted pt then NSG re: pt's status)  Pt denied any difficulty swallowing and is currently on a regular diet; tolerates swallowing pills w/ water per NSG. Pt conversed at conversational level w/out deficits noted; pt and staff denied any speech-language deficits.  No further skilled ST services indicated as pt appears at her baseline. Pt agreed. NSG to reconsult if any change in status.    Orinda Kenner, MS, CCC-SLP Watson,Katherine 03/17/2017, 11:39 AM

## 2017-03-20 ENCOUNTER — Telehealth: Payer: Self-pay | Admitting: *Deleted

## 2017-03-20 ENCOUNTER — Telehealth: Payer: Self-pay | Admitting: Internal Medicine

## 2017-03-20 NOTE — Telephone Encounter (Signed)
How far out do you want to see her.

## 2017-03-20 NOTE — Telephone Encounter (Signed)
HFU, Pt was discharged on 03/17/2017. Neighbor not sure of Dx. No appt avail to sch. Please advise? Pt family is not around.   Call Neoma Laming neighbor @ [phone]. Thank you!

## 2017-03-20 NOTE — Telephone Encounter (Signed)
See previous note

## 2017-03-20 NOTE — Telephone Encounter (Signed)
Patient stated that she was to receive physical therapy, however she has not had a visit from any agency  Pt contact [phone]

## 2017-03-21 NOTE — Telephone Encounter (Signed)
I can go ahead and order for home health assessment if insurance will allow.  Confirm she is agreeable for home health to come out and do an assessment for therapy, help in the home, etc.  If agreeable, let me know and I will place the order.

## 2017-03-22 DIAGNOSIS — H547 Unspecified visual loss: Secondary | ICD-10-CM | POA: Diagnosis not present

## 2017-03-22 DIAGNOSIS — R262 Difficulty in walking, not elsewhere classified: Secondary | ICD-10-CM | POA: Diagnosis not present

## 2017-03-22 DIAGNOSIS — Z7982 Long term (current) use of aspirin: Secondary | ICD-10-CM | POA: Diagnosis not present

## 2017-03-22 DIAGNOSIS — H409 Unspecified glaucoma: Secondary | ICD-10-CM | POA: Diagnosis not present

## 2017-03-22 DIAGNOSIS — J42 Unspecified chronic bronchitis: Secondary | ICD-10-CM | POA: Diagnosis not present

## 2017-03-22 DIAGNOSIS — M1990 Unspecified osteoarthritis, unspecified site: Secondary | ICD-10-CM | POA: Diagnosis not present

## 2017-03-22 DIAGNOSIS — M810 Age-related osteoporosis without current pathological fracture: Secondary | ICD-10-CM | POA: Diagnosis not present

## 2017-03-22 DIAGNOSIS — K219 Gastro-esophageal reflux disease without esophagitis: Secondary | ICD-10-CM | POA: Diagnosis not present

## 2017-03-22 DIAGNOSIS — Z8673 Personal history of transient ischemic attack (TIA), and cerebral infarction without residual deficits: Secondary | ICD-10-CM | POA: Diagnosis not present

## 2017-03-22 DIAGNOSIS — R531 Weakness: Secondary | ICD-10-CM | POA: Diagnosis not present

## 2017-03-22 DIAGNOSIS — G4730 Sleep apnea, unspecified: Secondary | ICD-10-CM | POA: Diagnosis not present

## 2017-03-23 NOTE — Telephone Encounter (Signed)
See below , thanks

## 2017-03-23 NOTE — Telephone Encounter (Signed)
Patient has agreed to have physical therapy  Pt contact [phone]

## 2017-03-23 NOTE — Telephone Encounter (Signed)
Sorry Dr. Nicki Reaper please advise where to schedule for HFU.

## 2017-03-23 NOTE — Telephone Encounter (Signed)
This is a hfu, thanks

## 2017-03-24 DIAGNOSIS — Z8673 Personal history of transient ischemic attack (TIA), and cerebral infarction without residual deficits: Secondary | ICD-10-CM | POA: Diagnosis not present

## 2017-03-24 DIAGNOSIS — R531 Weakness: Secondary | ICD-10-CM | POA: Diagnosis not present

## 2017-03-24 DIAGNOSIS — M1990 Unspecified osteoarthritis, unspecified site: Secondary | ICD-10-CM | POA: Diagnosis not present

## 2017-03-24 DIAGNOSIS — R262 Difficulty in walking, not elsewhere classified: Secondary | ICD-10-CM | POA: Diagnosis not present

## 2017-03-24 DIAGNOSIS — H547 Unspecified visual loss: Secondary | ICD-10-CM | POA: Diagnosis not present

## 2017-03-24 DIAGNOSIS — M810 Age-related osteoporosis without current pathological fracture: Secondary | ICD-10-CM | POA: Diagnosis not present

## 2017-03-24 NOTE — Telephone Encounter (Signed)
Tried to reach patient by phone left message to return call to office.  Previous message stated to [place] neighbor left message at that number to return call to office.

## 2017-03-24 NOTE — Telephone Encounter (Signed)
Christy Cisneros you have a TCM on her right?

## 2017-03-24 NOTE — Telephone Encounter (Signed)
I can work her in 12:30 on Thursday 03/30/17.

## 2017-03-24 NOTE — Telephone Encounter (Signed)
Christy Cisneros sent me a note on her stating needs hospital f/u.  I am planning on working her in next week.  Can see her and place order then if pt agreeable.  (that way I can make sure of what is needed and can discuss with her more).

## 2017-03-24 NOTE — Telephone Encounter (Signed)
Patient schedule and doing well.

## 2017-03-25 NOTE — Discharge Summary (Signed)
[facility] NAME: Christy Cisneros    MR#:  [id number]  DATE OF BIRTH:  01/30/1920  DATE OF ADMISSION:  03/16/2017 ADMITTING PHYSICIAN: Dustin Flock, MD  DATE OF DISCHARGE: 03/17/2017  4:40 PM  PRIMARY CARE PHYSICIAN: Einar Pheasant, MD   ADMISSION DIAGNOSIS:  Blurred vision [H53.8] Atherosclerosis [I70.90] Cerebrovascular accident (CVA), unspecified mechanism ([place]) [I63.9]  DISCHARGE DIAGNOSIS:  Active Problems:   CVA (cerebral vascular accident) ([place])   SECONDARY DIAGNOSIS:   Past Medical History:  Diagnosis Date  . Barrett's esophagus with esophagitis   . Chronic bronchitis ([place])   . GERD (gastroesophageal reflux disease)   . Glaucoma   . Hypercholesteremia   . Osteoarthritis   . Osteoporosis   . Sleep apnea    has CPAP  . TIA (transient ischemic attack)      ADMITTING HISTORY   HISTORY OF PRESENT ILLNESS: Christy Cisneros  is a 83 y.o. female with a known history of TIA and likely dementia was a poor historian. She was sent from her ophthalmologist due to blurry vision in the right eye. At the time of my visit to her room patient was actually asking for a magazine to read. She does report that she does have trouble with the left eye vision and can't see much through there.patient's CT scan of the head showed a pons lesion.   HOSPITAL COURSE:   * Transient ischemic attack Patient had MRI of the brain and MRA which showed nothing acute. Started on aspirin, Plavix and statin. Patient's symptoms have resolved. Work with physical therapy. Home health recommended and has been set up.  * Patient's other comorbidities remained stable. He is being discharged back home.  Stable  CONSULTS OBTAINED:  Treatment Team:  Catarina Hartshorn, MD  DRUG ALLERGIES:   Allergies  Allergen Reactions  . Alphagan [Brimonidine]     Burning, stinging, bloody eye, dry mouth  . Avelox [Moxifloxacin Hcl In Nacl] Other (See  Comments)    Dizziness, weakness  . Bacitracin     Tearing, itching, swelling, redness  . Cefdinir Nausea And Vomiting  . Cephalexin     HA, nausea, gas, abdominal pain  . Clarithromycin     HA, nausea, gas, abdominal pain  . Codeine     HA, nausea, gas, abdominal pain  . Erythromycin     HA, nausea, gas, abdominal pain  . Hydrocodone-Acetaminophen     REACTION: chest pain, constipation, nausea  . Lansoprazole     REACTION: cramps, nausea, diarrhea  . Polymyxin B     Itching and redness  . Vigamox [Moxifloxacin] Itching    Soreness, irritability, loss of appetite,  Dizziness, diarrhea, muscle weakness    DISCHARGE MEDICATIONS:   Discharge Medication List as of 03/17/2017  2:59 PM    START taking these medications   Details  atorvastatin (LIPITOR) 40 MG tablet Take 1 tablet (40 mg total) by mouth daily at 6 PM., Starting Fri 03/17/2017, Normal    clopidogrel (PLAVIX) 75 MG tablet Take 1 tablet (75 mg total) by mouth daily., Starting Fri 03/17/2017, Normal      CONTINUE these medications which have NOT CHANGED   Details  Aloe LIQD Take by mouth daily. , Historical Med    aspirin 81 MG tablet Take 81 mg by mouth daily., Historical Med    Bilberry, Vaccinium myrtillus, (BILBERRY PO) Take 1 capsule by mouth daily., Historical Med    calcium carbonate (OS-CAL) 600 MG TABS Take 600  mg by mouth daily., Historical Med    Flaxseed, Linseed, (GROUND FLAX SEEDS PO) Take by mouth daily. , Historical Med    Multiple Vitamins-Minerals (PRESERVISION AREDS 2 PO) Take by mouth 2 (two) times daily., Historical Med    omeprazole (PRILOSEC) 20 MG capsule TAKE ONE CAPSULE TWICE A DAY BEFORE MEALS, Normal    prednisoLONE acetate (PRED FORTE) 1 % ophthalmic suspension Place 1 drop into the left eye 2 (two) times daily., Historical Med    timolol (BETIMOL) 0.5 % ophthalmic solution Place 1 drop into the left eye 2 (two) times daily. , Historical Med    fluticasone (FLONASE) 50 MCG/ACT nasal  spray Place 2 sprays into both nostrils daily., Starting Fri 07/22/2016, Normal    ondansetron (ZOFRAN) 4 MG tablet Take 1 tablet (4 mg total) by mouth daily as needed for nausea or vomiting., Starting Fri 10/14/2016, Until Sat 10/14/2017, Print        Today   VITAL SIGNS:  Blood pressure (!) 174/68, pulse 76, temperature 98.6 F (37 C), temperature source Oral, resp. rate 18, height 5\' 4"  (1.626 m), weight 48.3 kg (106 lb 6.4 oz), SpO2 100 %.  I/O:  No intake or output data in the 24 hours ending 03/25/17 1433  PHYSICAL EXAMINATION:  Physical Exam  GENERAL:  83 y.o.-year-old patient lying in the bed with no acute distress.  LUNGS: Normal breath sounds bilaterally, no wheezing, rales,rhonchi or crepitation. No use of accessory muscles of respiration.  CARDIOVASCULAR: S1, S2 normal. No murmurs, rubs, or gallops.  ABDOMEN: Soft, non-tender, non-distended. Bowel sounds present. No organomegaly or mass.  NEUROLOGIC: Moves all 4 extremities. PSYCHIATRIC: The patient is alert and oriented x 3.  SKIN: No obvious rash, lesion, or ulcer.   DATA REVIEW:   CBC No results for input(s): WBC, HGB, HCT, PLT in the last 168 hours.  Chemistries  No results for input(s): NA, K, CL, CO2, GLUCOSE, BUN, CREATININE, CALCIUM, MG, AST, ALT, ALKPHOS, BILITOT in the last 168 hours.  Invalid input(s): GFRCGP  Cardiac Enzymes No results for input(s): TROPONINI in the last 168 hours.  Microbiology Results  Results for orders placed or performed in visit on 02/07/17  Urine Culture     Status: None   Collection Time: 02/07/17  4:03 PM  Result Value Ref Range Status   Organism ID, Bacteria NO GROWTH  Final    RADIOLOGY:  No results found.  Follow up with PCP in 1 week.  Management plans discussed with the patient, family and they are in agreement.  CODE STATUS:  Code Status History    Date Active Date Inactive Code Status Order ID Comments User Context   03/16/2017  3:27 PM 03/17/2017  8:11 PM  Full Code [id number]  Dustin Flock, MD Inpatient      TOTAL TIME TAKING CARE OF THIS PATIENT ON DAY OF DISCHARGE: more than 30 minutes.   Hillary Bow R M.D on 03/25/2017 at 2:33 PM  Between 7am to 6pm - Pager - [phone]  After 6pm go to [email or web address] - password [email or web address] [facility]  Office  [phone]  CC: Primary care physician; Einar Pheasant, MD  Note: This dictation was prepared with Dragon dictation along with smaller phrase technology. Any transcriptional errors that result from this process are unintentional.

## 2017-03-27 ENCOUNTER — Telehealth: Payer: Self-pay | Admitting: *Deleted

## 2017-03-27 ENCOUNTER — Telehealth: Payer: Self-pay | Admitting: Internal Medicine

## 2017-03-27 DIAGNOSIS — M810 Age-related osteoporosis without current pathological fracture: Secondary | ICD-10-CM | POA: Diagnosis not present

## 2017-03-27 DIAGNOSIS — R531 Weakness: Secondary | ICD-10-CM | POA: Diagnosis not present

## 2017-03-27 DIAGNOSIS — M1990 Unspecified osteoarthritis, unspecified site: Secondary | ICD-10-CM | POA: Diagnosis not present

## 2017-03-27 DIAGNOSIS — Z8673 Personal history of transient ischemic attack (TIA), and cerebral infarction without residual deficits: Secondary | ICD-10-CM | POA: Diagnosis not present

## 2017-03-27 DIAGNOSIS — H547 Unspecified visual loss: Secondary | ICD-10-CM | POA: Diagnosis not present

## 2017-03-27 DIAGNOSIS — R262 Difficulty in walking, not elsewhere classified: Secondary | ICD-10-CM | POA: Diagnosis not present

## 2017-03-27 NOTE — Telephone Encounter (Signed)
[facility] from [facility] has requested verbal orders to do access to help with med management .  Contact [place] [phone]

## 2017-03-27 NOTE — Telephone Encounter (Signed)
Verbal given for order to help with medications. I have made app with another provider for U/A issues tomorrow. If changes in symptoms she will go to walk in or ED

## 2017-03-27 NOTE — Telephone Encounter (Signed)
Christy Cisneros from [facility] called and is requesting a nursing referral to help patient with pill box. Christy Cisneros also states that pt is c/o pain with urination since  Yesterday. Please advise, thank you!  Call [facility] @ [phone]

## 2017-03-27 NOTE — Telephone Encounter (Signed)
Orders given see other message.

## 2017-03-28 ENCOUNTER — Ambulatory Visit (INDEPENDENT_AMBULATORY_CARE_PROVIDER_SITE_OTHER): Payer: Medicare Other | Admitting: Family

## 2017-03-28 ENCOUNTER — Encounter: Payer: Self-pay | Admitting: Family

## 2017-03-28 VITALS — BP 128/68 | HR 92 | Temp 98.1°F | Ht 64.0 in | Wt 101.8 lb

## 2017-03-28 DIAGNOSIS — N3000 Acute cystitis without hematuria: Secondary | ICD-10-CM | POA: Diagnosis not present

## 2017-03-28 DIAGNOSIS — I639 Cerebral infarction, unspecified: Secondary | ICD-10-CM | POA: Diagnosis not present

## 2017-03-28 LAB — URINALYSIS, MICROSCOPIC ONLY

## 2017-03-28 LAB — POCT URINALYSIS DIPSTICK
Glucose, UA: NEGATIVE
NITRITE UA: POSITIVE
PH UA: 5.5 (ref 5.0–8.0)
Spec Grav, UA: 1.025 (ref 1.010–1.025)
Urobilinogen, UA: 0.2 E.U./dL

## 2017-03-28 NOTE — Patient Instructions (Signed)
Plenty of water.   Will await urine culture.  No interactions seen with plavix and OTC Azo if you would like to try for urinary relief while we await culture  If there is no improvement in your symptoms, or if there is any worsening of symptoms, or if you have any additional concerns, please return for re-evaluation; or, if we are closed, consider going to the Emergency Room for evaluation if symptoms urgent.

## 2017-03-28 NOTE — Assessment & Plan Note (Addendum)
Afebrile. Urine positive for blood, leukocytes, nitrites. Well appearing. We will await urine studies including micro, urine culture. Advised patient plenty of water, Azo

## 2017-03-28 NOTE — Progress Notes (Signed)
Subjective:    Patient ID: Christy Cisneros, female    DOB: [birth year], 83 y.o.   MRN: [id number]  CC: Christy Cisneros is a 83 y.o. female who presents today for an acute visit.    HPI: CC: dysuria x 2 weeks, unchanged. Endorses urinary frequency. Denies any flank pain, nausea, vomiting, abdominal pain, fever, hematuria     Urine cultures negative 7/18 and 1/18 for UTI HISTORY:  Past Medical History:  Diagnosis Date  . Barrett's esophagus with esophagitis   . Chronic bronchitis ([place])   . GERD (gastroesophageal reflux disease)   . Glaucoma   . Hypercholesteremia   . Osteoarthritis   . Osteoporosis   . Sleep apnea    has CPAP  . TIA (transient ischemic attack)    Past Surgical History:  Procedure Laterality Date  . BUNIONECTOMY     left  . ROTATOR CUFF REPAIR     right   Family History  Problem Relation Age of Onset  . Breast cancer Sister   . Heart disease Mother     Allergies: Alphagan [brimonidine]; Avelox [moxifloxacin hcl in nacl]; Bacitracin; Cefdinir; Cephalexin; Clarithromycin; Codeine; Erythromycin; Hydrocodone-acetaminophen; Lansoprazole; Polymyxin b; and Vigamox [moxifloxacin] Current Outpatient Prescriptions on File Prior to Visit  Medication Sig Dispense Refill  . Aloe LIQD Take by mouth daily.     Marland Kitchen aspirin 81 MG tablet Take 81 mg by mouth daily.    Marland Kitchen atorvastatin (LIPITOR) 40 MG tablet Take 1 tablet (40 mg total) by mouth daily at 6 PM. 30 tablet 0  . Bilberry, Vaccinium myrtillus, (BILBERRY PO) Take 1 capsule by mouth daily.    . calcium carbonate (OS-CAL) 600 MG TABS Take 600 mg by mouth daily.    . clopidogrel (PLAVIX) 75 MG tablet Take 1 tablet (75 mg total) by mouth daily. 30 tablet 0  . Flaxseed, Linseed, (GROUND FLAX SEEDS PO) Take by mouth daily.     . fluticasone (FLONASE) 50 MCG/ACT nasal spray Place 2 sprays into both nostrils daily. 16 g 0  . Multiple Vitamins-Minerals (PRESERVISION AREDS 2 PO) Take by mouth 2 (two) times daily.      Marland Kitchen omeprazole (PRILOSEC) 20 MG capsule TAKE ONE CAPSULE TWICE A DAY BEFORE MEALS 120 capsule 1  . ondansetron (ZOFRAN) 4 MG tablet Take 1 tablet (4 mg total) by mouth daily as needed for nausea or vomiting. 30 tablet 1  . prednisoLONE acetate (PRED FORTE) 1 % ophthalmic suspension Place 1 drop into the left eye 2 (two) times daily.    . timolol (BETIMOL) 0.5 % ophthalmic solution Place 1 drop into the left eye 2 (two) times daily.      No current facility-administered medications on file prior to visit.     Social History  Substance Use Topics  . Smoking status: Never Smoker  . Smokeless tobacco: Never Used  . Alcohol use 0.0 oz/week     Comment: wine/brandy occasional    Review of Systems  Constitutional: Negative for chills and fever.  Respiratory: Negative for cough.   Cardiovascular: Negative for chest pain and palpitations.  Gastrointestinal: Negative for abdominal pain, nausea and vomiting.  Genitourinary: Positive for dysuria and frequency. Negative for hematuria.      Objective:    BP 128/68   Pulse 92   Temp 98.1 F (36.7 C) (Oral)   Ht 5\' 4"  (1.626 m)   Wt 101 lb 12.8 oz (46.2 kg)   SpO2 95%   BMI 17.47 kg/m  Physical Exam  Constitutional: She appears well-developed and well-nourished.  Cardiovascular: Normal rate, regular rhythm, normal heart sounds and normal pulses.   Pulmonary/Chest: Effort normal and breath sounds normal. She has no wheezes. She has no rhonchi. She has no rales.  Abdominal: There is no CVA tenderness.  Neurological: She is alert.  Skin: Skin is warm and dry.  Psychiatric: She has a normal mood and affect. Her speech is normal and behavior is normal. Thought content normal.  Vitals reviewed.      Assessment & Plan:   Problem List Items Addressed This Visit      Genitourinary   UTI (urinary tract infection) - Primary    Afebrile. Urine positive for blood, leukocytes, nitrites. Well appearing. We will await urine studies including  micro, urine culture. Advised patient plenty of water, Azo      Relevant Orders   POCT urinalysis dipstick (Completed)   CULTURE, URINE COMPREHENSIVE   Urine Microscopic Only        I am having Ms. Howser maintain her prednisoLONE acetate, timolol, calcium carbonate, (Bilberry, Vaccinium myrtillus, (BILBERRY PO)), aspirin, Multiple Vitamins-Minerals (PRESERVISION AREDS 2 PO), (Flaxseed, Linseed, (GROUND FLAX SEEDS PO)), Aloe, fluticasone, ondansetron, omeprazole, atorvastatin, and clopidogrel.   No orders of the defined types were placed in this encounter.   Return precautions given.   Risks, benefits, and alternatives of the medications and treatment plan prescribed today were discussed, and patient expressed understanding.   Education regarding symptom management and diagnosis given to patient on AVS.  Continue to follow with Einar Pheasant, MD for routine health maintenance.   [name] and I agreed with plan.   Mable Paris, FNP

## 2017-03-28 NOTE — Progress Notes (Signed)
Pre visit review using our clinic review tool, if applicable. No additional management support is needed unless otherwise documented below in the visit note. 

## 2017-03-29 ENCOUNTER — Telehealth: Payer: Self-pay | Admitting: Internal Medicine

## 2017-03-29 DIAGNOSIS — H547 Unspecified visual loss: Secondary | ICD-10-CM | POA: Diagnosis not present

## 2017-03-29 DIAGNOSIS — R531 Weakness: Secondary | ICD-10-CM | POA: Diagnosis not present

## 2017-03-29 DIAGNOSIS — Z8673 Personal history of transient ischemic attack (TIA), and cerebral infarction without residual deficits: Secondary | ICD-10-CM | POA: Diagnosis not present

## 2017-03-29 DIAGNOSIS — N390 Urinary tract infection, site not specified: Secondary | ICD-10-CM

## 2017-03-29 DIAGNOSIS — R262 Difficulty in walking, not elsewhere classified: Secondary | ICD-10-CM | POA: Diagnosis not present

## 2017-03-29 DIAGNOSIS — M1990 Unspecified osteoarthritis, unspecified site: Secondary | ICD-10-CM | POA: Diagnosis not present

## 2017-03-29 DIAGNOSIS — M810 Age-related osteoporosis without current pathological fracture: Secondary | ICD-10-CM | POA: Diagnosis not present

## 2017-03-29 NOTE — Telephone Encounter (Signed)
Amy from [facility] called back returning your call. Please advise, thank you!  Call @ [phone]

## 2017-03-29 NOTE — Telephone Encounter (Signed)
Spoke with patient and told her we are still awaiting culture results

## 2017-03-29 NOTE — Telephone Encounter (Signed)
Left message on Christy Cisneros's voicemail.

## 2017-03-29 NOTE — Telephone Encounter (Signed)
Pt called back looking for test results. Pt states that she is in misery. Please advise, thank you!

## 2017-03-29 NOTE — Telephone Encounter (Signed)
Left message to return call to office.  Patients urine culture has not been resulted yet so we can not give her any antibiotics until resulted and reviewed by provider.

## 2017-03-29 NOTE — Telephone Encounter (Signed)
[facility] from [facility] is with patient and pt is still c/o significant pain and has gotten no relief with the uti. Please advise, thank you!  Call Amy [phone]

## 2017-03-29 NOTE — Telephone Encounter (Signed)
Left message to return call to office.

## 2017-03-30 ENCOUNTER — Ambulatory Visit: Payer: Medicare Other | Admitting: Internal Medicine

## 2017-03-30 DIAGNOSIS — Z8673 Personal history of transient ischemic attack (TIA), and cerebral infarction without residual deficits: Secondary | ICD-10-CM | POA: Diagnosis not present

## 2017-03-30 DIAGNOSIS — R531 Weakness: Secondary | ICD-10-CM | POA: Diagnosis not present

## 2017-03-30 DIAGNOSIS — M810 Age-related osteoporosis without current pathological fracture: Secondary | ICD-10-CM | POA: Diagnosis not present

## 2017-03-30 DIAGNOSIS — M1990 Unspecified osteoarthritis, unspecified site: Secondary | ICD-10-CM | POA: Diagnosis not present

## 2017-03-30 DIAGNOSIS — H547 Unspecified visual loss: Secondary | ICD-10-CM | POA: Diagnosis not present

## 2017-03-30 DIAGNOSIS — R262 Difficulty in walking, not elsewhere classified: Secondary | ICD-10-CM | POA: Diagnosis not present

## 2017-03-30 MED ORDER — AMOXICILLIN 500 MG PO CAPS: 500.0000 mg | ORAL_CAPSULE | Freq: Two times a day (BID) | ORAL | 0 refills | Status: AC

## 2017-03-30 NOTE — Telephone Encounter (Signed)
Left message to return call to office.

## 2017-03-30 NOTE — Telephone Encounter (Signed)
Spoke with patient and notified her that culture is not back to prescribe antibiotic yet. Will call when results have been reviewed.

## 2017-03-30 NOTE — Telephone Encounter (Signed)
Patient was seen by you on 03/28/2017. She has called multiple times complaining of bladder pain and requesting antibiotic. States the AZO is not helping her at all. I have notified patient multiple times that her urine culture has not been resulted yet and we would call as soon as we have results. Any advice for her in the mean time for relief?

## 2017-03-30 NOTE — Telephone Encounter (Signed)
Call pt  So sorry !   Lets start antibiotic. Reviewed patient's chart and she was treated with amoxicillin a year ago with Dr. Nicki Reaper for UTI. Will use this medication again. Please ensure no allergy- she has a lot of antibiotic allergies.  5 days worth. Ensure patient start probiotics.   Also ensure patient returns next week to have urine rechecked to ensure the blood has resolved. Order placed.

## 2017-03-30 NOTE — Telephone Encounter (Signed)
Pharmacy called to check the status of the medication due to pt calling pharmacy. I spoke with Larena Glassman and the urine has not been resulted yet. Thank you!

## 2017-03-31 DIAGNOSIS — M1990 Unspecified osteoarthritis, unspecified site: Secondary | ICD-10-CM | POA: Diagnosis not present

## 2017-03-31 DIAGNOSIS — R531 Weakness: Secondary | ICD-10-CM | POA: Diagnosis not present

## 2017-03-31 DIAGNOSIS — M810 Age-related osteoporosis without current pathological fracture: Secondary | ICD-10-CM | POA: Diagnosis not present

## 2017-03-31 DIAGNOSIS — H547 Unspecified visual loss: Secondary | ICD-10-CM | POA: Diagnosis not present

## 2017-03-31 DIAGNOSIS — K219 Gastro-esophageal reflux disease without esophagitis: Secondary | ICD-10-CM | POA: Diagnosis not present

## 2017-03-31 DIAGNOSIS — R262 Difficulty in walking, not elsewhere classified: Secondary | ICD-10-CM | POA: Diagnosis not present

## 2017-03-31 DIAGNOSIS — J42 Unspecified chronic bronchitis: Secondary | ICD-10-CM | POA: Diagnosis not present

## 2017-03-31 DIAGNOSIS — Z7982 Long term (current) use of aspirin: Secondary | ICD-10-CM | POA: Diagnosis not present

## 2017-03-31 DIAGNOSIS — G4730 Sleep apnea, unspecified: Secondary | ICD-10-CM | POA: Diagnosis not present

## 2017-03-31 DIAGNOSIS — H409 Unspecified glaucoma: Secondary | ICD-10-CM | POA: Diagnosis not present

## 2017-03-31 DIAGNOSIS — Z8673 Personal history of transient ischemic attack (TIA), and cerebral infarction without residual deficits: Secondary | ICD-10-CM | POA: Diagnosis not present

## 2017-03-31 LAB — CULTURE, URINE COMPREHENSIVE

## 2017-04-04 DIAGNOSIS — R262 Difficulty in walking, not elsewhere classified: Secondary | ICD-10-CM | POA: Diagnosis not present

## 2017-04-04 DIAGNOSIS — H547 Unspecified visual loss: Secondary | ICD-10-CM | POA: Diagnosis not present

## 2017-04-04 DIAGNOSIS — Z8673 Personal history of transient ischemic attack (TIA), and cerebral infarction without residual deficits: Secondary | ICD-10-CM | POA: Diagnosis not present

## 2017-04-04 DIAGNOSIS — M810 Age-related osteoporosis without current pathological fracture: Secondary | ICD-10-CM | POA: Diagnosis not present

## 2017-04-04 DIAGNOSIS — R531 Weakness: Secondary | ICD-10-CM | POA: Diagnosis not present

## 2017-04-04 DIAGNOSIS — M1990 Unspecified osteoarthritis, unspecified site: Secondary | ICD-10-CM | POA: Diagnosis not present

## 2017-04-06 DIAGNOSIS — H547 Unspecified visual loss: Secondary | ICD-10-CM | POA: Diagnosis not present

## 2017-04-06 DIAGNOSIS — R531 Weakness: Secondary | ICD-10-CM | POA: Diagnosis not present

## 2017-04-06 DIAGNOSIS — R262 Difficulty in walking, not elsewhere classified: Secondary | ICD-10-CM | POA: Diagnosis not present

## 2017-04-06 DIAGNOSIS — Z8673 Personal history of transient ischemic attack (TIA), and cerebral infarction without residual deficits: Secondary | ICD-10-CM | POA: Diagnosis not present

## 2017-04-06 DIAGNOSIS — M810 Age-related osteoporosis without current pathological fracture: Secondary | ICD-10-CM | POA: Diagnosis not present

## 2017-04-06 DIAGNOSIS — M1990 Unspecified osteoarthritis, unspecified site: Secondary | ICD-10-CM | POA: Diagnosis not present

## 2017-04-10 DIAGNOSIS — R262 Difficulty in walking, not elsewhere classified: Secondary | ICD-10-CM | POA: Diagnosis not present

## 2017-04-10 DIAGNOSIS — M1990 Unspecified osteoarthritis, unspecified site: Secondary | ICD-10-CM | POA: Diagnosis not present

## 2017-04-10 DIAGNOSIS — Z8673 Personal history of transient ischemic attack (TIA), and cerebral infarction without residual deficits: Secondary | ICD-10-CM | POA: Diagnosis not present

## 2017-04-10 DIAGNOSIS — H547 Unspecified visual loss: Secondary | ICD-10-CM | POA: Diagnosis not present

## 2017-04-10 DIAGNOSIS — M810 Age-related osteoporosis without current pathological fracture: Secondary | ICD-10-CM | POA: Diagnosis not present

## 2017-04-10 DIAGNOSIS — R531 Weakness: Secondary | ICD-10-CM | POA: Diagnosis not present

## 2017-04-11 ENCOUNTER — Telehealth: Payer: Self-pay | Admitting: Internal Medicine

## 2017-04-11 ENCOUNTER — Ambulatory Visit (INDEPENDENT_AMBULATORY_CARE_PROVIDER_SITE_OTHER): Payer: Medicare Other | Admitting: Internal Medicine

## 2017-04-11 ENCOUNTER — Encounter: Payer: Self-pay | Admitting: Internal Medicine

## 2017-04-11 VITALS — BP 118/78 | HR 62 | Temp 97.6°F | Resp 16 | Ht 64.17 in | Wt 100.8 lb

## 2017-04-11 DIAGNOSIS — M810 Age-related osteoporosis without current pathological fracture: Secondary | ICD-10-CM | POA: Diagnosis not present

## 2017-04-11 DIAGNOSIS — R413 Other amnesia: Secondary | ICD-10-CM | POA: Diagnosis not present

## 2017-04-11 DIAGNOSIS — R300 Dysuria: Secondary | ICD-10-CM | POA: Diagnosis not present

## 2017-04-11 DIAGNOSIS — K2100 Gastro-esophageal reflux disease with esophagitis, without bleeding: Secondary | ICD-10-CM

## 2017-04-11 DIAGNOSIS — I639 Cerebral infarction, unspecified: Secondary | ICD-10-CM | POA: Diagnosis not present

## 2017-04-11 DIAGNOSIS — G459 Transient cerebral ischemic attack, unspecified: Secondary | ICD-10-CM

## 2017-04-11 DIAGNOSIS — E7800 Pure hypercholesterolemia, unspecified: Secondary | ICD-10-CM

## 2017-04-11 DIAGNOSIS — K2270 Barrett's esophagus without dysplasia: Secondary | ICD-10-CM

## 2017-04-11 LAB — URINALYSIS, ROUTINE W REFLEX MICROSCOPIC
Bilirubin Urine: NEGATIVE
Leukocytes, UA: NEGATIVE
NITRITE: NEGATIVE
PH: 6 (ref 5.0–8.0)
Total Protein, Urine: >=300 — AB
Urine Glucose: NEGATIVE
Urobilinogen, UA: 0.2 (ref 0.0–1.0)

## 2017-04-11 NOTE — Telephone Encounter (Signed)
Patient aware and understands all medications.

## 2017-04-11 NOTE — Telephone Encounter (Signed)
Looked in chart did not see any medications that were d/c at this office visit

## 2017-04-11 NOTE — Progress Notes (Signed)
Patient ID: Christy Cisneros, female   DOB: [birth year], 83 y.o.   MRN: [id number]   Subjective:    Patient ID: Christy Cisneros, female    DOB: [birth year], 83 y.o.   MRN: [id number]  HPI  Patient here for a scheduled follow up.  She was recently admitted (03/16/17) with blurred vision.  Note reviewed.  She was diagnosed with TIA.  Had MRI of the brain and MRA which showed nothing acute.  She was started on aspirin, plavix and cholesterol medication.  Home health arranged.  She is doing physical therapy.  Doing well with this.  She is taking aspirin, plavix and her cholesterol medication.  Discussed with her today the importance of taking her medication regularly and not skipping.  She stays active.  Still walks around her apartment.  No chest pain.  Breathing stable.  States she is eating.  No vomiting.  No abdominal pain.  Bowels moving. Discussed her current living situation.  Discussed home health.  Discussed the possibility of assisted living.  She declines.       Past Medical History:  Diagnosis Date  . Barrett's esophagus with esophagitis   . Chronic bronchitis ([place])   . GERD (gastroesophageal reflux disease)   . Glaucoma   . Hypercholesteremia   . Osteoarthritis   . Osteoporosis   . Sleep apnea    has CPAP  . TIA (transient ischemic attack)    Past Surgical History:  Procedure Laterality Date  . BUNIONECTOMY     left  . ROTATOR CUFF REPAIR     right   Family History  Problem Relation Age of Onset  . Breast cancer Sister   . Heart disease Mother    Social History   Social History  . Marital status: Single    Spouse name: N/A  . Number of children: N/A  . Years of education: N/A   Social History Main Topics  . Smoking status: Never Smoker  . Smokeless tobacco: Never Used  . Alcohol use 0.0 oz/week     Comment: wine/brandy occasional  . Drug use: No  . Sexual activity: No   Other Topics Concern  . None   Social History Narrative  . None    Outpatient  Encounter Prescriptions as of 04/11/2017  Medication Sig  . Aloe LIQD Take by mouth daily.   Marland Kitchen aspirin 81 MG tablet Take 81 mg by mouth daily.  Marland Kitchen atorvastatin (LIPITOR) 40 MG tablet Take 1 tablet (40 mg total) by mouth daily at 6 PM.  . calcium carbonate (OS-CAL) 600 MG TABS Take 600 mg by mouth daily.  . clopidogrel (PLAVIX) 75 MG tablet Take 1 tablet (75 mg total) by mouth daily.  . Flaxseed, Linseed, (GROUND FLAX SEEDS PO) Take by mouth daily.   . Multiple Vitamins-Minerals (PRESERVISION AREDS 2 PO) Take by mouth 2 (two) times daily.  Marland Kitchen omeprazole (PRILOSEC) 20 MG capsule TAKE ONE CAPSULE TWICE A DAY BEFORE MEALS  . prednisoLONE acetate (PRED FORTE) 1 % ophthalmic suspension Place 1 drop into the left eye 2 (two) times daily.  . timolol (BETIMOL) 0.5 % ophthalmic solution Place 1 drop into the left eye 2 (two) times daily.   . [DISCONTINUED] ondansetron (ZOFRAN) 4 MG tablet Take 1 tablet (4 mg total) by mouth daily as needed for nausea or vomiting.  . [DISCONTINUED] Bilberry, Vaccinium myrtillus, (BILBERRY PO) Take 1 capsule by mouth daily.  . [DISCONTINUED] fluticasone (FLONASE) 50 MCG/ACT nasal spray Place 2 sprays into both nostrils  daily.   No facility-administered encounter medications on file as of 04/11/2017.     Review of Systems  Constitutional: Negative for appetite change and fever.  HENT: Negative for congestion and sinus pressure.   Respiratory: Negative for cough, chest tightness and shortness of breath.   Cardiovascular: Negative for chest pain, palpitations and leg swelling.  Gastrointestinal: Negative for abdominal pain, diarrhea, nausea and vomiting.  Genitourinary: Negative for difficulty urinating.       Reports noticing some dysuria at times.    Musculoskeletal: Negative for joint swelling and myalgias.  Skin: Negative for color change and rash.  Neurological: Negative for dizziness and headaches.  Psychiatric/Behavioral: Negative for agitation and dysphoric mood.         Objective:    Physical Exam  Constitutional: She appears well-developed and well-nourished. No distress.  HENT:  Nose: Nose normal.  Mouth/Throat: Oropharynx is clear and moist.  Neck: Neck supple. No thyromegaly present.  Cardiovascular: Normal rate and regular rhythm.   Pulmonary/Chest: Breath sounds normal. No respiratory distress. She has no wheezes.  Abdominal: Soft. Bowel sounds are normal. There is no tenderness.  Musculoskeletal: She exhibits no edema or tenderness.  Lymphadenopathy:    She has no cervical adenopathy.  Skin: No rash noted. No erythema.  Psychiatric: She has a normal mood and affect. Her behavior is normal.    BP 118/78 (BP Location: Left Arm, Patient Position: Sitting, Cuff Size: Normal)   Pulse 62   Temp 97.6 F (36.4 C) (Oral)   Resp 16   Ht 5' 4.17" (1.63 m)   Wt 100 lb 12.8 oz (45.7 kg)   SpO2 93%   BMI 17.21 kg/m  Wt Readings from Last 3 Encounters:  04/11/17 100 lb 12.8 oz (45.7 kg)  03/28/17 101 lb 12.8 oz (46.2 kg)  03/16/17 106 lb 6.4 oz (48.3 kg)     Lab Results  Component Value Date   WBC 7.6 03/16/2017   HGB 13.7 03/16/2017   HCT 40.6 03/16/2017   PLT 276 03/16/2017   GLUCOSE 128 (H) 03/16/2017   CHOL 207 (H) 03/17/2017   TRIG 93 03/17/2017   HDL 72 03/17/2017   LDLCALC 116 (H) 03/17/2017   ALT 18 03/16/2017   AST 28 03/16/2017   NA 140 03/16/2017   K 4.3 03/16/2017   CL 109 03/16/2017   CREATININE 0.71 03/16/2017   BUN 21 (H) 03/16/2017   CO2 23 03/16/2017   TSH 1.544 09/15/2016   INR 0.96 03/16/2017   HGBA1C 5.4 03/17/2017    Mr Brain Wo Contrast  Result Date: 03/17/2017 CLINICAL DATA:  Blurry vision, follow-up pontine stroke. History of TIA, probable dementia. EXAM: MRI HEAD WITHOUT CONTRAST MRA HEAD WITHOUT CONTRAST TECHNIQUE: Multiplanar, multiecho pulse sequences of the brain and surrounding structures were obtained without intravenous contrast. Angiographic images of the head were obtained using MRA  technique without contrast. COMPARISON:  CT head March 16, 2017 at 1058 hours and CTA HEAD March 16, 2017 at 1207 hours and MRI head Dec 06, 2012 FINDINGS: MRI HEAD FINDINGS BRAIN: No reduced diffusion to suggest acute ischemia with particular attention to RIGHT pons. No susceptibility artifact to suggest hemorrhage. The ventricles and sulci are normal for patient's age. Old bilateral basal ganglia, RIGHT pontine lacunar infarct. Prominent perivascular spaces seen with chronic small vessel ischemic disease. Patchy to confluent supratentorial white matter T2 hyperintensities without midline shift, mass effect or masses. No abnormal extra-axial fluid collections. VASCULAR: Normal major intracranial vascular flow voids present at skull  base. SKULL AND UPPER CERVICAL SPINE: No abnormal sellar expansion. No suspicious calvarial bone marrow signal. Craniocervical junction maintained. SINUSES/ORBITS: Atretic RIGHT maxillary sinus with mucosal thickening compatible with chronic sinusitis. LEFT mastoid effusion. The included ocular globes and orbital contents are non-suspicious. Status post bilateral ocular lens implants. OTHER: None. MRA HEAD FINDINGS ANTERIOR CIRCULATION: Normal flow related enhancement of the included cervical, petrous, cavernous and supraclinoid internal carotid arteries. Patent anterior communicating artery. Patent anterior and middle cerebral arteries, including distal segments. Symmetric signal loss at trifurcation due to tortuosity. No large vessel occlusion, aneurysm. POSTERIOR CIRCULATION: LEFT vertebral artery is dominant. Basilar artery is patent, with normal flow related enhancement of the main branch vessels. Patent posterior cerebral arteries, symmetric signal loss associated with tortuosity, normal caliber. Robust RIGHT posterior communicating artery No large vessel occlusion, aneurysm. ANATOMIC VARIANTS: None. Source images and MIP images were reviewed. IMPRESSION: MRI HEAD: 1. No acute  intracranial process. No acute infarct with particular attention to RIGHT pons. 2. Numerous old lacunar lacunar infarcts. Moderate to severe chronic small vessel ischemic disease. MRA HEAD: 1. No emergent large vessel occlusion. Known stenoses better documented on today's CTA HEAD. Electronically Signed   By: Elon Alas M.D.   On: 03/17/2017 00:25   Mr Jodene Nam Head/brain ZD Cm  Result Date: 03/17/2017 CLINICAL DATA:  Blurry vision, follow-up pontine stroke. History of TIA, probable dementia. EXAM: MRI HEAD WITHOUT CONTRAST MRA HEAD WITHOUT CONTRAST TECHNIQUE: Multiplanar, multiecho pulse sequences of the brain and surrounding structures were obtained without intravenous contrast. Angiographic images of the head were obtained using MRA technique without contrast. COMPARISON:  CT head March 16, 2017 at 1058 hours and CTA HEAD March 16, 2017 at 1207 hours and MRI head Dec 06, 2012 FINDINGS: MRI HEAD FINDINGS BRAIN: No reduced diffusion to suggest acute ischemia with particular attention to RIGHT pons. No susceptibility artifact to suggest hemorrhage. The ventricles and sulci are normal for patient's age. Old bilateral basal ganglia, RIGHT pontine lacunar infarct. Prominent perivascular spaces seen with chronic small vessel ischemic disease. Patchy to confluent supratentorial white matter T2 hyperintensities without midline shift, mass effect or masses. No abnormal extra-axial fluid collections. VASCULAR: Normal major intracranial vascular flow voids present at skull base. SKULL AND UPPER CERVICAL SPINE: No abnormal sellar expansion. No suspicious calvarial bone marrow signal. Craniocervical junction maintained. SINUSES/ORBITS: Atretic RIGHT maxillary sinus with mucosal thickening compatible with chronic sinusitis. LEFT mastoid effusion. The included ocular globes and orbital contents are non-suspicious. Status post bilateral ocular lens implants. OTHER: None. MRA HEAD FINDINGS ANTERIOR CIRCULATION: Normal flow  related enhancement of the included cervical, petrous, cavernous and supraclinoid internal carotid arteries. Patent anterior communicating artery. Patent anterior and middle cerebral arteries, including distal segments. Symmetric signal loss at trifurcation due to tortuosity. No large vessel occlusion, aneurysm. POSTERIOR CIRCULATION: LEFT vertebral artery is dominant. Basilar artery is patent, with normal flow related enhancement of the main branch vessels. Patent posterior cerebral arteries, symmetric signal loss associated with tortuosity, normal caliber. Robust RIGHT posterior communicating artery No large vessel occlusion, aneurysm. ANATOMIC VARIANTS: None. Source images and MIP images were reviewed. IMPRESSION: MRI HEAD: 1. No acute intracranial process. No acute infarct with particular attention to RIGHT pons. 2. Numerous old lacunar lacunar infarcts. Moderate to severe chronic small vessel ischemic disease. MRA HEAD: 1. No emergent large vessel occlusion. Known stenoses better documented on today's CTA HEAD. Electronically Signed   By: Elon Alas M.D.   On: 03/17/2017 00:25       Assessment & Plan:  Problem List Items Addressed This Visit    BARRETTS ESOPHAGUS    Has declined f/u EGD.  Remains on prilosec.        GERD (gastroesophageal reflux disease)    Has declines f/u EGD.  Remains on prilosec.        HYPERCHOLESTEROLEMIA    On atorvastatin now.  Started during recent admission.  Follow lipid panel and liver function tests.        Memory change    Saw neurology. Note reviewed.  Did not feel any further w/up warranted.        Osteoporosis    Continue weight bearing exercise.  Due reclast.        Transient cerebral ischemia    Recently admitted and diagnosed with TIA.  W/up as outlined.  On aspirin, plavix and statin medication.  Follow.  No further change in vision.         Other Visit Diagnoses    Dysuria    -  Primary   Check urinalysis to confirm no infection.      Relevant Orders   Urinalysis, Routine w reflex microscopic (Completed)   Urine Culture (Completed)       Einar Pheasant, MD

## 2017-04-11 NOTE — Telephone Encounter (Signed)
Christy Cisneros took away the doxycycline.  She had two prescription bottles in her back.  She is to continue on the plavix (clopidegrel) and atorvastatin.  I wrote these down for her on her paper.  She is also on a baby aspirin daily.

## 2017-04-11 NOTE — Telephone Encounter (Signed)
Pt called and wanted to get clarification on which medications she should be taking and which ones she shouldn't. Please advise,thank you!  Call pt @ [phone]

## 2017-04-12 LAB — URINE CULTURE
MICRO NUMBER: 80999569
SPECIMEN QUALITY: ADEQUATE

## 2017-04-13 ENCOUNTER — Telehealth: Payer: Self-pay | Admitting: Internal Medicine

## 2017-04-13 DIAGNOSIS — M810 Age-related osteoporosis without current pathological fracture: Secondary | ICD-10-CM | POA: Diagnosis not present

## 2017-04-13 DIAGNOSIS — R262 Difficulty in walking, not elsewhere classified: Secondary | ICD-10-CM | POA: Diagnosis not present

## 2017-04-13 DIAGNOSIS — M1990 Unspecified osteoarthritis, unspecified site: Secondary | ICD-10-CM | POA: Diagnosis not present

## 2017-04-13 DIAGNOSIS — Z8673 Personal history of transient ischemic attack (TIA), and cerebral infarction without residual deficits: Secondary | ICD-10-CM | POA: Diagnosis not present

## 2017-04-13 DIAGNOSIS — R531 Weakness: Secondary | ICD-10-CM | POA: Diagnosis not present

## 2017-04-13 DIAGNOSIS — H547 Unspecified visual loss: Secondary | ICD-10-CM | POA: Diagnosis not present

## 2017-04-13 NOTE — Telephone Encounter (Signed)
Does she actually need a referral to a psychologist or do we just give her numbers.  I can place an order if needed.

## 2017-04-13 NOTE — Telephone Encounter (Signed)
She can just be given numbers

## 2017-04-13 NOTE — Telephone Encounter (Signed)
Christy Cisneros, she does not need a referral placed.  I can provide written numbers that we can give or send to her.  This may be better than calling.  See me.

## 2017-04-13 NOTE — Telephone Encounter (Signed)
Patient came into office asking for help, with getting meals on wheels and help with remaining in her home, that her neighbor was ostracizing her and wanting her to move out of her apartment, because they want their daughter to move into her apartment, that they have taken her keys , she had to have her locks changed. These neighbors [place] to help her with different items around her home and with laundry, patient is wanting help with ADLs and laundry services.Advised patient of the [place] program and patient agreed to their services. Referral made by phone to meals on wheels and to [facility]. Patient also ask if PCP would be willing to refer to a psychologist that the way her neighbors are treating her is really bothering her and making her feel on edge and anxious.

## 2017-04-14 ENCOUNTER — Encounter: Payer: Self-pay | Admitting: Internal Medicine

## 2017-04-14 MED ORDER — CLOPIDOGREL BISULFATE 75 MG PO TABS: 75.0000 mg | ORAL_TABLET | Freq: Every day | ORAL | 2 refills | Status: DC

## 2017-04-14 MED ORDER — ATORVASTATIN CALCIUM 40 MG PO TABS: 40.0000 mg | ORAL_TABLET | Freq: Every day | ORAL | 1 refills | Status: DC

## 2017-04-14 NOTE — Assessment & Plan Note (Signed)
Saw neurology. Note reviewed.  Did not feel any further w/up warranted.

## 2017-04-14 NOTE — Telephone Encounter (Signed)
Tried to reach patient no answer.

## 2017-04-14 NOTE — Assessment & Plan Note (Signed)
Has declines f/u EGD.  Remains on prilosec.

## 2017-04-14 NOTE — Assessment & Plan Note (Signed)
On atorvastatin now.  Started during recent admission.  Follow lipid panel and liver function tests.

## 2017-04-14 NOTE — Telephone Encounter (Signed)
Pt called back returning your call. Please advise, thank you!  Call pt @ [phone]

## 2017-04-14 NOTE — Assessment & Plan Note (Signed)
Recently admitted and diagnosed with TIA.  W/up as outlined.  On aspirin, plavix and statin medication.  Follow.  No further change in vision.

## 2017-04-14 NOTE — Telephone Encounter (Signed)
Spoke wityh PCP to let [facility] recommend counselor so that counseling will not be at patient expense. PCP agreed.

## 2017-04-14 NOTE — Addendum Note (Signed)
Addended by: Alisa Graff on: 04/14/2017 03:13 AM   Modules accepted: Orders

## 2017-04-14 NOTE — Assessment & Plan Note (Signed)
Has declined f/u EGD.  Remains on prilosec.

## 2017-04-14 NOTE — Assessment & Plan Note (Signed)
Continue weight bearing exercise.  Due reclast.

## 2017-04-18 DIAGNOSIS — R262 Difficulty in walking, not elsewhere classified: Secondary | ICD-10-CM | POA: Diagnosis not present

## 2017-04-18 DIAGNOSIS — Z8673 Personal history of transient ischemic attack (TIA), and cerebral infarction without residual deficits: Secondary | ICD-10-CM | POA: Diagnosis not present

## 2017-04-18 DIAGNOSIS — M810 Age-related osteoporosis without current pathological fracture: Secondary | ICD-10-CM | POA: Diagnosis not present

## 2017-04-18 DIAGNOSIS — H547 Unspecified visual loss: Secondary | ICD-10-CM | POA: Diagnosis not present

## 2017-04-18 DIAGNOSIS — M1990 Unspecified osteoarthritis, unspecified site: Secondary | ICD-10-CM | POA: Diagnosis not present

## 2017-04-18 DIAGNOSIS — R531 Weakness: Secondary | ICD-10-CM | POA: Diagnosis not present

## 2017-04-20 DIAGNOSIS — R262 Difficulty in walking, not elsewhere classified: Secondary | ICD-10-CM | POA: Diagnosis not present

## 2017-04-20 DIAGNOSIS — H547 Unspecified visual loss: Secondary | ICD-10-CM | POA: Diagnosis not present

## 2017-04-20 DIAGNOSIS — M1990 Unspecified osteoarthritis, unspecified site: Secondary | ICD-10-CM | POA: Diagnosis not present

## 2017-04-20 DIAGNOSIS — R531 Weakness: Secondary | ICD-10-CM | POA: Diagnosis not present

## 2017-04-20 DIAGNOSIS — M810 Age-related osteoporosis without current pathological fracture: Secondary | ICD-10-CM | POA: Diagnosis not present

## 2017-04-20 DIAGNOSIS — Z8673 Personal history of transient ischemic attack (TIA), and cerebral infarction without residual deficits: Secondary | ICD-10-CM | POA: Diagnosis not present

## 2017-04-21 DIAGNOSIS — Z23 Encounter for immunization: Secondary | ICD-10-CM | POA: Diagnosis not present

## 2017-04-24 ENCOUNTER — Telehealth: Payer: Self-pay | Admitting: *Deleted

## 2017-04-24 DIAGNOSIS — M1990 Unspecified osteoarthritis, unspecified site: Secondary | ICD-10-CM | POA: Diagnosis not present

## 2017-04-24 DIAGNOSIS — Z8673 Personal history of transient ischemic attack (TIA), and cerebral infarction without residual deficits: Secondary | ICD-10-CM | POA: Diagnosis not present

## 2017-04-24 DIAGNOSIS — R531 Weakness: Secondary | ICD-10-CM | POA: Diagnosis not present

## 2017-04-24 DIAGNOSIS — H547 Unspecified visual loss: Secondary | ICD-10-CM | POA: Diagnosis not present

## 2017-04-24 DIAGNOSIS — M810 Age-related osteoporosis without current pathological fracture: Secondary | ICD-10-CM | POA: Diagnosis not present

## 2017-04-24 DIAGNOSIS — R262 Difficulty in walking, not elsewhere classified: Secondary | ICD-10-CM | POA: Diagnosis not present

## 2017-04-24 NOTE — Telephone Encounter (Signed)
Juliann Pulse from [facility] requested to know if pt had a calcium creatine and a vitamin D drawn in this office Contact [phone]

## 2017-04-25 NOTE — Telephone Encounter (Signed)
Christy Cisneros faxed lab information that she needed for labs.

## 2017-04-26 DIAGNOSIS — H401123 Primary open-angle glaucoma, left eye, severe stage: Secondary | ICD-10-CM | POA: Diagnosis not present

## 2017-04-26 DIAGNOSIS — H1820 Unspecified corneal edema: Secondary | ICD-10-CM | POA: Diagnosis not present

## 2017-04-26 DIAGNOSIS — H401113 Primary open-angle glaucoma, right eye, severe stage: Secondary | ICD-10-CM | POA: Diagnosis not present

## 2017-04-26 DIAGNOSIS — H35363 Drusen (degenerative) of macula, bilateral: Secondary | ICD-10-CM | POA: Diagnosis not present

## 2017-04-26 DIAGNOSIS — H53121 Transient visual loss, right eye: Secondary | ICD-10-CM | POA: Diagnosis not present

## 2017-04-26 DIAGNOSIS — H353131 Nonexudative age-related macular degeneration, bilateral, early dry stage: Secondary | ICD-10-CM | POA: Diagnosis not present

## 2017-04-26 DIAGNOSIS — H5211 Myopia, right eye: Secondary | ICD-10-CM | POA: Diagnosis not present

## 2017-04-26 DIAGNOSIS — H18832 Recurrent erosion of cornea, left eye: Secondary | ICD-10-CM | POA: Diagnosis not present

## 2017-04-26 DIAGNOSIS — H524 Presbyopia: Secondary | ICD-10-CM | POA: Diagnosis not present

## 2017-04-26 DIAGNOSIS — H52221 Regular astigmatism, right eye: Secondary | ICD-10-CM | POA: Diagnosis not present

## 2017-04-26 DIAGNOSIS — H04123 Dry eye syndrome of bilateral lacrimal glands: Secondary | ICD-10-CM | POA: Diagnosis not present

## 2017-04-26 DIAGNOSIS — H47233 Glaucomatous optic atrophy, bilateral: Secondary | ICD-10-CM | POA: Diagnosis not present

## 2017-05-01 NOTE — Telephone Encounter (Signed)
error 

## 2017-05-09 ENCOUNTER — Telehealth: Payer: Self-pay | Admitting: Internal Medicine

## 2017-05-09 DIAGNOSIS — L089 Local infection of the skin and subcutaneous tissue, unspecified: Secondary | ICD-10-CM | POA: Diagnosis not present

## 2017-05-09 DIAGNOSIS — S80812A Abrasion, left lower leg, initial encounter: Secondary | ICD-10-CM | POA: Diagnosis not present

## 2017-05-09 NOTE — Telephone Encounter (Signed)
Pt called and stated that she has a hard lump on the back of her left ankle, but more towards the calf muscle. Pt states that it is swollen and has a head on it. Please advise, thank you!  Call pt @ [phone]

## 2017-05-09 NOTE — Telephone Encounter (Signed)
Noted.  Need to confirm pt goes for evaluation.

## 2017-05-09 NOTE — Telephone Encounter (Signed)
Patients neighbor called back with patient due to her not being able to hear on the phone. Patient has a wound on the back of her leg. She says it is swollen and warm and looks Infected and is very sore. She says she noticed it about a week ago. Advised patient to go to [facility]. Patient agreed to comply

## 2017-05-09 NOTE — Telephone Encounter (Signed)
LMTCB

## 2017-05-09 NOTE — Telephone Encounter (Signed)
She went to [facility] per [place]

## 2017-05-12 ENCOUNTER — Other Ambulatory Visit: Payer: Self-pay | Admitting: Internal Medicine

## 2017-05-12 ENCOUNTER — Telehealth: Payer: Self-pay

## 2017-05-12 DIAGNOSIS — S81811D Laceration without foreign body, right lower leg, subsequent encounter: Secondary | ICD-10-CM | POA: Diagnosis not present

## 2017-05-12 DIAGNOSIS — S81802D Unspecified open wound, left lower leg, subsequent encounter: Secondary | ICD-10-CM

## 2017-05-12 DIAGNOSIS — M810 Age-related osteoporosis without current pathological fracture: Secondary | ICD-10-CM | POA: Diagnosis not present

## 2017-05-12 DIAGNOSIS — L03115 Cellulitis of right lower limb: Secondary | ICD-10-CM | POA: Diagnosis not present

## 2017-05-12 NOTE — Telephone Encounter (Signed)
Spoke with patients friend Pamala Hurry. She says the wound she went to [facility] clinic for last week has now turned black and looks terrible. They are at [facility] walk-in at this time for re-evaluation but patient would like referral placed to wound clinic. They also said that if Ameshia can not be reached at her number, call Pamala Hurry [phone]

## 2017-05-12 NOTE — Progress Notes (Signed)
Order placed for wound clinic referral per pt request.

## 2017-05-12 NOTE — Telephone Encounter (Signed)
Order placed for wound clinic referral per pt request.

## 2017-05-24 ENCOUNTER — Encounter: Payer: Medicare Other | Attending: Internal Medicine | Admitting: Internal Medicine

## 2017-05-24 DIAGNOSIS — L97211 Non-pressure chronic ulcer of right calf limited to breakdown of skin: Secondary | ICD-10-CM | POA: Diagnosis not present

## 2017-05-24 DIAGNOSIS — I87321 Chronic venous hypertension (idiopathic) with inflammation of right lower extremity: Secondary | ICD-10-CM | POA: Insufficient documentation

## 2017-05-24 DIAGNOSIS — H409 Unspecified glaucoma: Secondary | ICD-10-CM | POA: Diagnosis not present

## 2017-05-24 DIAGNOSIS — M1990 Unspecified osteoarthritis, unspecified site: Secondary | ICD-10-CM | POA: Diagnosis not present

## 2017-05-24 DIAGNOSIS — Z881 Allergy status to other antibiotic agents status: Secondary | ICD-10-CM | POA: Insufficient documentation

## 2017-05-24 DIAGNOSIS — G4730 Sleep apnea, unspecified: Secondary | ICD-10-CM | POA: Diagnosis not present

## 2017-05-24 DIAGNOSIS — Z885 Allergy status to narcotic agent status: Secondary | ICD-10-CM | POA: Insufficient documentation

## 2017-05-24 DIAGNOSIS — S81811A Laceration without foreign body, right lower leg, initial encounter: Secondary | ICD-10-CM | POA: Diagnosis not present

## 2017-05-26 NOTE — Progress Notes (Signed)
CONSANDRA, LASKE ([id number]) Visit Report for 05/24/2017 Debridement Details Patient Name: Christy Cisneros, Christy Cisneros. Date of Service: 05/24/2017 8:00 AM Medical Record Number: [id number] Patient Account Number: [id number] Date of Birth/Sex: 10-13-1919 (83 y.o. Female) Treating RN: Montey Hora Primary Care Provider: Einar Pheasant Other Clinician: Referring Provider: Einar Pheasant Treating Provider/Extender: Tito Dine in Treatment: 0 Debridement Performed for Wound #3 Right,Anterior Lower Leg Assessment: Performed By: Physician Ricard Dillon, MD Debridement: Debridement Pre-procedure Verification/Time Yes - 08:42 Out Taken: Start Time: 08:42 Pain Control: Lidocaine 4% Topical Solution Level: Skin/Subcutaneous Tissue Total Area Debrided (L x W): 1.2 (cm) x 2 (cm) = 2.4 (cm) Tissue and other material Viable, Non-Viable, Eschar, Fibrin/Slough, Subcutaneous debrided: Instrument: Curette Bleeding: Minimum Hemostasis Achieved: Pressure End Time: 08:44 Procedural Pain: 0 Post Procedural Pain: 0 Response to Treatment: Procedure was tolerated well Post Debridement Measurements of Total Wound Length: (cm) 1.2 Width: (cm) 2 Depth: (cm) 0.2 Volume: (cm) 0.377 Character of Wound/Ulcer Post Debridement: Improved Post Procedure Diagnosis Same as Pre-procedure Electronic Signature(s) Signed: 05/24/2017 5:07:57 PM By: Montey Hora Signed: 05/24/2017 5:28:55 PM By: Linton Ham MD Entered By: Linton Ham on 05/24/2017 09:07:49 [name] ([id number]) -------------------------------------------------------------------------------- HPI Details Patient Name: Christy Cisneros, Christy Cisneros. Date of Service: 05/24/2017 8:00 AM Medical Record Number: [id number] Patient Account Number: [id number] Date of Birth/Sex: 02-Nov-1919 (83 y.o. Female) Treating RN: Montey Hora Primary Care Provider: Einar Pheasant Other Clinician: Referring Provider: Einar Pheasant Treating Provider/Extender: Tito Dine in Treatment: 0 History of Present Illness HPI Description: Very pleasant 83 year old with no significant past medical history. No diabetes or peripheral vascular disease. She traumatized her right anterior calf on a car door on 07/08/2014. She is slowly improving with regular debridements, compression, and. No significant pain. No claudication or rest pain. Right ABI 1.02. She walks a mile a day. No drainage. No fever or chills. She returns to clinic today and says that the ulceration has healed. 12/20/16; This is a 83 year old women who traumatized her right leg against a table while adjusting a shade in her home. She suffered a skin tear. She was seen 2 weeks ago in her primary doctor's office and had a non stick dressing placed with kerlix and coban and that has apparently remained in place since then. She is complaining of pain but no systemic symptoms. I note she has been in this clinic 3 years ago for a similar presentation on the right leg.She was not seen by any of the current providers in our practice. She has compression stockings at home but does not use them. She has venous insufficiency. ABI's in this clinic 1.09 12/27/16; arrives today with the wound in better condition. Dimension smaller. We've been using Prisma 01/04/17; patient's wound on the right anterior calf looks a lot better this week dimension smaller base of the wound looks healthy. She has been using Prisma under 2 layer compression. She has chronic bilateral venous insufficiency and tells me she has stockings at home that she has not been wearing 01/11/17; continued improvement in the right anterior lateral wound which was initially trauma in the setting of chronic venous insufficiency. We have been using Prisma under 2 layer compression 01/18/17; continued improvement in the right anterior leg wound which was initially traumatic in the setting of chronic  venous insufficiency. We have been using Prisma under 2 layer compression. Nice improvement in terms of wound dimensions 01/25/17; right anterior leg wound which was initially trauma in the setting of chronic venous  insufficiency. She does not have much in the way of edema however skin damage secondary to chronic hemosiderin deposition is quite apparent right greater than left. Her wound today is totally healed over. She brought in a support stocking. READMISSION 05/24/17; this is a 83 year old woman who traumatized her right leg sometime in the last 2-3 weeks. She has very frail skin secondary to chronic venous insufficiency with marked bilateral hemosiderin deposition. She was seen in her primary physician's office on 05/12/17 and treated with doxycycline and Bactroban. We suggested compression stockings or at least support stockings last time although she is not compliant with these. Her ABI in this clinic was 1.09 Electronic Signature(s) Signed: 05/24/2017 5:28:55 PM By: Linton Ham MD Entered By: Linton Ham on 05/24/2017 09:10:34 [name] ([id number]) -------------------------------------------------------------------------------- Physical Exam Details Patient Name: Christy Cisneros, Christy Cisneros. Date of Service: 05/24/2017 8:00 AM Medical Record Number: [id number] Patient Account Number: [id number] Date of Birth/Sex: 03/30/1920 (83 y.o. Female) Treating RN: Montey Hora Primary Care Provider: Einar Pheasant Other Clinician: Referring Provider: Einar Pheasant Treating Provider/Extender: Ricard Dillon Weeks in Treatment: 0 Constitutional Sitting or standing Blood Pressure is within target range for patient.. Pulse regular and within target range for patient.Marland Kitchen Respirations regular, non-labored and within target range.. Temperature is normal and within the target range for the patient.Marland Kitchen appears in no distress. Eyes Conjunctivae clear. No  discharge. Respiratory Respiratory effort is easy and symmetric bilaterally. Rate is normal at rest and on room air.. Bilateral breath sounds are clear and equal in all lobes with no wheezes, rales or rhonchi.. Cardiovascular Heart rhythm and rate regular, without murmur or gallop.. Femoral arteries without bruits and pulses strong.. Pedal pulses on the right are easily palpable. She has no edema but severe chronic venous insufficiency with hemosiderin deposition. Gastrointestinal (GI) Abdomen is soft and non-distended without masses or tenderness. Bowel sounds active in all quadrants.. No liver or spleen enlargement or tenderness.. Genitourinary (GU) Bladder without fullness, masses or tenderness.. Lymphatic None palpable in the right popliteal or inguinal area. Integumentary (Hair, Skin) She has no systemic rash and other than the venous insufficiency/stasis dermatitis there are no findings. Psychiatric No evidence of depression, anxiety, or agitation. Calm, cooperative, and communicative. Appropriate interactions and affect.. Notes Wound exam; on the right anterior leg small horizontal skin tear. Superior to this his skin that is probably not going to turn out to be viable however I left this in place for this week so it could properly declare itself. For today the open area was debrided of necrotic surface debris and surrounding callus on the wound edges. Hemostasis with direct pressure Electronic Signature(s) Signed: 05/24/2017 5:28:55 PM By: Linton Ham MD Entered By: Linton Ham on 05/24/2017 09:12:29 [name] ([id number]) -------------------------------------------------------------------------------- Physician Orders Details Patient Name: MIOSOTIS, WETSEL. Date of Service: 05/24/2017 8:00 AM Medical Record Number: [id number] Patient Account Number: [id number] Date of Birth/Sex: 12/23/1919 (83 y.o. Female) Treating RN: Montey Hora Primary Care  Provider: Einar Pheasant Other Clinician: Referring Provider: Einar Pheasant Treating Provider/Extender: Tito Dine in Treatment: 0 Verbal / Phone Orders: No Diagnosis Coding Wound Cleansing Wound #3 Right,Anterior Lower Leg o Clean wound with Normal Saline. o May shower with protection. Anesthetic Wound #3 Right,Anterior Lower Leg o Topical Lidocaine 4% cream applied to wound bed prior to debridement Primary Wound Dressing Wound #3 Right,Anterior Lower Leg o Prisma Ag Secondary Dressing Wound #3 Right,Anterior Lower Leg o ABD pad o Non-adherent pad Dressing Change Frequency Wound #3 Right,Anterior Lower Leg o  Change dressing every week Follow-up Appointments Wound #3 Right,Anterior Lower Leg o Return Appointment in 1 week. Edema Control Wound #3 Right,Anterior Lower Leg o Kerlix and Coban - Right Lower Extremity Additional Orders / Instructions Wound #3 Right,Anterior Lower Leg o Increase protein intake. Electronic Signature(s) Signed: 05/24/2017 5:07:57 PM By: Montey Hora Signed: 05/24/2017 5:28:55 PM By: Linton Ham MD Entered By: Montey Hora on 05/24/2017 08:50:45 YARIANNA, VARBLE ([id number], STRIETER ([id number]) -------------------------------------------------------------------------------- Problem List Details Patient Name: Christy Cisneros, Christy Cisneros. Date of Service: 05/24/2017 8:00 AM Medical Record Number: [id number] Patient Account Number: [id number] Date of Birth/Sex: January 09, 1920 (83 y.o. Female) Treating RN: Montey Hora Primary Care Provider: Einar Pheasant Other Clinician: Referring Provider: Einar Pheasant Treating Provider/Extender: Tito Dine in Treatment: 0 Active Problems ICD-10 Encounter Code Description Active Date Diagnosis L97.211 Non-pressure chronic ulcer of right calf limited to breakdown of skin 05/24/2017 Yes I87.321 Chronic venous hypertension (idiopathic) with  inflammation of right 05/24/2017 Yes lower extremity Inactive Problems Resolved Problems Electronic Signature(s) Signed: 05/24/2017 5:28:55 PM By: Linton Ham MD Entered By: Linton Ham on 05/24/2017 09:03:57 [name] ([id number]) -------------------------------------------------------------------------------- Progress Note/History and Physical Details Patient Name: AURIELLE, [place]. Date of Service: 05/24/2017 8:00 AM Medical Record Number: [id number] Patient Account Number: [id number] Date of Birth/Sex: Apr 07, 1920 (83 y.o. Female) Treating RN: Montey Hora Primary Care Provider: Einar Pheasant Other Clinician: Referring Provider: Einar Pheasant Treating Provider/Extender: Tito Dine in Treatment: 0 Subjective History of Present Illness (HPI) Very pleasant 83 year old with no significant past medical history. No diabetes or peripheral vascular disease. She traumatized her right anterior calf on a car door on 07/08/2014. She is slowly improving with regular debridements, compression, and. No significant pain. No claudication or rest pain. Right ABI 1.02. She walks a mile a day. No drainage. No fever or chills. She returns to clinic today and says that the ulceration has healed. 12/20/16; This is a 83 year old women who traumatized her right leg against a table while adjusting a shade in her home. She suffered a skin tear. She was seen 2 weeks ago in her primary doctor's office and had a non stick dressing placed with kerlix and coban and that has apparently remained in place since then. She is complaining of pain but no systemic symptoms. I note she has been in this clinic 3 years ago for a similar presentation on the right leg.She was not seen by any of the current providers in our practice. She has compression stockings at home but does not use them. She has venous insufficiency. ABI's in this clinic 1.09 12/27/16; arrives today with the wound in  better condition. Dimension smaller. We've been using Prisma 01/04/17; patient's wound on the right anterior calf looks a lot better this week dimension smaller base of the wound looks healthy. She has been using Prisma under 2 layer compression. She has chronic bilateral venous insufficiency and tells me she has stockings at home that she has not been wearing 01/11/17; continued improvement in the right anterior lateral wound which was initially trauma in the setting of chronic venous insufficiency. We have been using Prisma under 2 layer compression 01/18/17; continued improvement in the right anterior leg wound which was initially traumatic in the setting of chronic venous insufficiency. We have been using Prisma under 2 layer compression. Nice improvement in terms of wound dimensions 01/25/17; right anterior leg wound which was initially trauma in the setting of chronic venous insufficiency. She does not have much in the  way of edema however skin damage secondary to chronic hemosiderin deposition is quite apparent right greater than left. Her wound today is totally healed over. She brought in a support stocking. READMISSION 05/24/17; this is a 83 year old woman who traumatized her right leg sometime in the last 2-3 weeks. She has very frail skin secondary to chronic venous insufficiency with marked bilateral hemosiderin deposition. She was seen in her primary physician's office on 05/12/17 and treated with doxycycline and Bactroban. We suggested compression stockings or at least support stockings last time although she is not compliant with these. Her ABI in this clinic was 1.09 Wound History Patient presents with 1 open wound that has been present for approximately 1 month. Patient has been treating wound in the following manner: dry dressing. Laboratory tests have not been performed in the last month. Patient reportedly has not tested positive for an antibiotic resistant organism. Patient  reportedly has not tested positive for osteomyelitis. Patient History Information obtained from Patient. Allergies polymyxin B, bacitracin, codeine, Keflex, Alphagan P, clarithromycin, timolol maleate, Erythromycin Base, Percocet, Prevacid, Vigamox, Avelox, cefdinir, cephalexin, lansoprazole Christy Cisneros, Christy Cisneros ([id number]) Family History Cancer, Heart Disease - Mother, Hypertension - Mother, No family history of Diabetes, Hereditary Spherocytosis, Kidney Disease, Lung Disease, Seizures, Stroke, Thyroid Problems, Tuberculosis. Social History Never smoker, Marital Status - Single, Alcohol Use - Daily, Drug Use - No History, Caffeine Use - Daily. Medical History Eyes Patient has history of Glaucoma Denies history of Cataracts, Optic Neuritis Ear/Nose/Mouth/Throat Denies history of Chronic sinus problems/congestion, Middle ear problems Hematologic/Lymphatic Denies history of Anemia, Hemophilia, Human Immunodeficiency Virus, Lymphedema, Sickle Cell Disease Respiratory Patient has history of Sleep Apnea - not using CPAP Denies history of Aspiration, Asthma, Chronic Obstructive Pulmonary Disease (COPD), Pneumothorax, Tuberculosis Cardiovascular Denies history of Angina, Arrhythmia, Congestive Heart Failure, Coronary Artery Disease, Deep Vein Thrombosis, Hypertension, Hypotension, Myocardial Infarction, Peripheral Arterial Disease, Peripheral Venous Disease, Phlebitis, Vasculitis Gastrointestinal Denies history of Cirrhosis , Colitis, Crohn s, Hepatitis A, Hepatitis B, Hepatitis C Endocrine Denies history of Type I Diabetes, Type II Diabetes Genitourinary Denies history of End Stage Renal Disease Immunological Denies history of Lupus Erythematosus, Raynaud s, Scleroderma Integumentary (Skin) Denies history of History of Burn, History of pressure wounds Musculoskeletal Patient has history of Osteoarthritis Denies history of Gout, Rheumatoid Arthritis,  Osteomyelitis Neurologic Denies history of Dementia, Neuropathy, Quadriplegia, Paraplegia, Seizure Disorder Oncologic Denies history of Received Chemotherapy, Received Radiation Psychiatric Denies history of Anorexia/bulimia, Confinement Anxiety Medical And Surgical History Notes Ear/Nose/Mouth/Throat bilateral hearing aids Gastrointestinal GERD ANGENI, CHAUDHURI. ([id number]) Objective Constitutional Sitting or standing Blood Pressure is within target range for patient.. Pulse regular and within target range for patient.Marland Kitchen Respirations regular, non-labored and within target range.. Temperature is normal and within the target range for the patient.Marland Kitchen appears in no distress. Vitals Time Taken: 8:20 AM, Height: 61 in, Source: Measured, Weight: 120 lbs, Source: Measured, BMI: 22.7, Pulse: 72 bpm, Respiratory Rate: 16 breaths/min, Blood Pressure: 133/70 mmHg. Eyes Conjunctivae clear. No discharge. Respiratory Respiratory effort is easy and symmetric bilaterally. Rate is normal at rest and on room air.. Bilateral breath sounds are clear and equal in all lobes with no wheezes, rales or rhonchi.. Cardiovascular Heart rhythm and rate regular, without murmur or gallop.. Femoral arteries without bruits and pulses strong.. Pedal pulses on the right are easily palpable. She has no edema but severe chronic venous insufficiency with hemosiderin deposition. Gastrointestinal (GI) Abdomen is soft and non-distended without masses or tenderness. Bowel sounds active in all quadrants.. No liver or  spleen enlargement or tenderness.. Genitourinary (GU) Bladder without fullness, masses or tenderness.. Lymphatic None palpable in the right popliteal or inguinal area. Psychiatric No evidence of depression, anxiety, or agitation. Calm, cooperative, and communicative. Appropriate interactions and affect.. General Notes: Wound exam; on the right anterior leg small horizontal skin tear. Superior to this his  skin that is probably not going to turn out to be viable however I left this in place for this week so it could properly declare itself. For today the open area was debrided of necrotic surface debris and surrounding callus on the wound edges. Hemostasis with direct pressure Integumentary (Hair, Skin) She has no systemic rash and other than the venous insufficiency/stasis dermatitis there are no findings. Wound #3 status is Open. Original cause of wound was Trauma. The wound is located on the Right,Anterior Lower Leg. The wound measures 1.2cm length x 2cm width x 0.1cm depth; 1.885cm^2 area and 0.188cm^3 volume. There is no tunneling or undermining noted. There is a large amount of serous drainage noted. The wound margin is flat and intact. There is small (1-33%) red granulation within the wound bed. There is a large (67-100%) amount of necrotic tissue within the wound bed including Eschar and Adherent Slough. The periwound skin appearance exhibited: Erythema. The periwound skin appearance did not exhibit: Callus, Crepitus, Excoriation, Induration, Rash, Scarring, Dry/Scaly, Maceration, Atrophie Blanche, Cyanosis, Ecchymosis, Hemosiderin Staining, Mottled, Pallor, Rubor. The surrounding wound skin color is noted with erythema which is circumferential. Periwound temperature was noted as No Abnormality. The periwound has tenderness on palpation. ARNESHIA, ADE ([id number]) Assessment Active Problems ICD-10 [phone] - Non-pressure chronic ulcer of right calf limited to breakdown of skin I87.321 - Chronic venous hypertension (idiopathic) with inflammation of right lower extremity Procedures Wound #3 Pre-procedure diagnosis of Wound #3 is a Trauma, Other located on the Right,Anterior Lower Leg . There was a Skin/Subcutaneous Tissue Debridement ([id number]) debridement with total area of 2.4 sq cm performed by Ricard Dillon, MD. with the following instrument(s): Curette to remove Viable  and Non-Viable tissue/material including Fibrin/Slough, Eschar, and Subcutaneous after achieving pain control using Lidocaine 4% Topical Solution. A time out was conducted at 08:42, prior to the start of the procedure. A Minimum amount of bleeding was controlled with Pressure. The procedure was tolerated well with a pain level of 0 throughout and a pain level of 0 following the procedure. Post Debridement Measurements: 1.2cm length x 2cm width x 0.2cm depth; 0.377cm^3 volume. Character of Wound/Ulcer Post Debridement is improved. Post procedure Diagnosis Wound #3: Same as Pre-Procedure Plan Wound Cleansing: Wound #3 Right,Anterior Lower Leg: Clean wound with Normal Saline. May shower with protection. Anesthetic: Wound #3 Right,Anterior Lower Leg: Topical Lidocaine 4% cream applied to wound bed prior to debridement Primary Wound Dressing: Wound #3 Right,Anterior Lower Leg: Prisma Ag Secondary Dressing: Wound #3 Right,Anterior Lower Leg: ABD pad Non-adherent pad Dressing Change Frequency: Wound #3 Right,Anterior Lower Leg: Change dressing every week Follow-up Appointments: Wound #3 Right,Anterior Lower Leg: Return Appointment in 1 week. Edema Control: Wound #3 Right,Anterior Lower Leg: Kerlix and Coban - Right Lower Extremity Additional Orders / Instructions: Wound #3 Right,Anterior Lower Leg: LENNAN, MALONE. ([id number]) Increase protein intake. #1 we will use Prisma/ABDs/2 alert compression #2 careful examination of the skin above this horizontal laceration next week to determine whether this is going to remain viable. I am not optimistic. # 32Q impression which the patient is going to leave in place. We were successful with this last time. She was however  noncompliant with stockings Electronic Signature(s) Signed: 05/24/2017 5:28:55 PM By: Linton Ham MD Entered By: Linton Ham on 05/24/2017 09:13:37 [name]  ([id number]) -------------------------------------------------------------------------------- ROS/PFSH Details Patient Name: MARGRETT, KALB. Date of Service: 05/24/2017 8:00 AM Medical Record Number: [id number] Patient Account Number: [id number] Date of Birth/Sex: January 31, 1920 (83 y.o. Female) Treating RN: Montey Hora Primary Care Provider: Einar Pheasant Other Clinician: Referring Provider: Einar Pheasant Treating Provider/Extender: Tito Dine in Treatment: 0 Label Progress Note Print Version as History and Physical for this encounter Information Obtained From Patient Wound History Do you currently have one or more open woundso Yes How many open wounds do you currently haveo 1 Approximately how long have you had your woundso 1 month How have you been treating your wound(s) until nowo dry dressing Has your wound(s) ever healed and then re-openedo No Have you had any lab work done in the past montho No Have you tested positive for an antibiotic resistant organism (MRSA, VRE)o No Have you tested positive for osteomyelitis (bone infection)o No Eyes Medical History: Positive for: Glaucoma Negative for: Cataracts; Optic Neuritis Ear/Nose/Mouth/Throat Medical History: Negative for: Chronic sinus problems/congestion; Middle ear problems Past Medical History Notes: bilateral hearing aids Hematologic/Lymphatic Medical History: Negative for: Anemia; Hemophilia; Human Immunodeficiency Virus; Lymphedema; Sickle Cell Disease Respiratory Medical History: Positive for: Sleep Apnea - not using CPAP Negative for: Aspiration; Asthma; Chronic Obstructive Pulmonary Disease (COPD); Pneumothorax; Tuberculosis Cardiovascular Medical History: Negative for: Angina; Arrhythmia; Congestive Heart Failure; Coronary Artery Disease; Deep Vein Thrombosis; Hypertension; Hypotension; Myocardial Infarction; Peripheral Arterial Disease; Peripheral Venous Disease;  Phlebitis; Vasculitis Gastrointestinal Medical History: Negative for: Cirrhosis ; Colitis; Crohnos; Hepatitis A; Hepatitis B; Hepatitis C Past Medical History Notes: Christy Cisneros, Christy Cisneros ([id number]) GERD Endocrine Medical History: Negative for: Type I Diabetes; Type II Diabetes Genitourinary Medical History: Negative for: End Stage Renal Disease Immunological Medical History: Negative for: Lupus Erythematosus; Raynaudos; Scleroderma Integumentary (Skin) Medical History: Negative for: History of Burn; History of pressure wounds Musculoskeletal Medical History: Positive for: Osteoarthritis Negative for: Gout; Rheumatoid Arthritis; Osteomyelitis Neurologic Medical History: Negative for: Dementia; Neuropathy; Quadriplegia; Paraplegia; Seizure Disorder Oncologic Medical History: Negative for: Received Chemotherapy; Received Radiation Psychiatric Medical History: Negative for: Anorexia/bulimia; Confinement Anxiety HBO Extended History Items Eyes: Glaucoma Immunizations Pneumococcal Vaccine: Received Pneumococcal Vaccination: Yes Implantable Devices Family and Social History Cancer: Yes; Diabetes: No; Heart Disease: Yes - Mother; Hereditary Spherocytosis: No; Hypertension: Yes - Mother; Kidney Disease: No; Lung Disease: No; Seizures: No; Stroke: No; Thyroid Problems: No; Tuberculosis: No; Never smoker; Marital Status - Single; Alcohol Use: Daily; Drug Use: No History; Caffeine Use: Daily; Financial Concerns: No; Food, Clothing or Shelter Needs: No; Support System Lacking: No; Transportation Concerns: No; Advanced Directives: Yes (Not Provided); Christy Cisneros, Christy Cisneros ([id number]) Patient does not want information on Advanced Directives; Living Will: Yes (Copy provided); Medical Power of Attorney: Yes - Freddrick March (Copy provided) Electronic Signature(s) Signed: 05/24/2017 5:07:57 PM By: Montey Hora Signed: 05/24/2017 5:28:55 PM By: Linton Ham MD Entered By:  Montey Hora on 05/24/2017 08:19:05 [name] ([id number]) -------------------------------------------------------------------------------- [place] Details Patient Name: Christy Cisneros, Christy Cisneros. Date of Service: 05/24/2017 Medical Record Number: [id number] Patient Account Number: [id number] Date of Birth/Sex: 09-16-1919 (83 y.o. 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 Modifier: of skin Quantity: 1 Physician Procedures CPT4 Code Description: 8337445 99214 - WC PHYS LEVEL 4 - EST PT ICD-10 Diagnosis Description 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

## 2017-05-26 NOTE — Progress Notes (Signed)
Christy Cisneros, Christy Cisneros ([id number]) Visit Report for 05/24/2017 Allergy List Details Patient Name: Christy Cisneros, Christy Cisneros. Date of Service: 05/24/2017 8:00 AM Medical Record Number: [id number] Patient Account Number: [id number] Date of Birth/Sex: 05-Oct-1919 (83 y.o. Female) Treating RN: Montey Hora Primary Care Bahja Bence: Einar Pheasant Other Clinician: Referring Derrica Sieg: Einar Pheasant Treating Alnisa Hasley/Extender: Ricard Dillon Weeks in Treatment: 0 Allergies Active Allergies polymyxin B bacitracin codeine Keflex Alphagan P clarithromycin timolol maleate Erythromycin Base Percocet Prevacid Vigamox Avelox cefdinir cephalexin lansoprazole Allergy Notes Electronic Signature(s) Signed: 05/24/2017 5:07:57 PM By: Montey Hora Entered By: Montey Hora on 05/24/2017 08:14:39 Christy Cisneros ([id number]) -------------------------------------------------------------------------------- Arrival Information Details Patient Name: Christy Cisneros, Christy Cisneros. Date of Service: 05/24/2017 8:00 AM Medical Record Number: [id number] Patient Account Number: [id number] Date of Birth/Sex: Jul 11, 1920 (83 y.o. Female) Treating RN: Montey Hora Primary Care Elizet Kaplan: Einar Pheasant Other Clinician: Referring Ruhee Enck: Einar Pheasant Treating Benedicta Sultan/Extender: Tito Dine in Treatment: 0 Visit Information Patient Arrived: Ambulatory Arrival Time: 08:12 Accompanied By: friend Transfer Assistance: None Patient Identification Verified: Yes Secondary Verification Process Completed: Yes Patient Has Alerts: Yes Patient Alerts: aspirin 81, plavix ABI R 1.09 History Since Last Visit Added or deleted any medications: No Any new allergies or adverse reactions: No Had a fall or experienced change in activities of daily living that may affect risk of falls: No Signs or symptoms of abuse/neglect since last visito No Hospitalized since last visit: No Electronic  Signature(s) Signed: 05/24/2017 8:33:49 AM By: Montey Hora Entered By: Montey Hora on 05/24/2017 08:33:49 Christy Cisneros ([id number]) -------------------------------------------------------------------------------- Clinic Level of Care Assessment Details Patient Name: Christy Cisneros. Date of Service: 05/24/2017 8:00 AM Medical Record Number: [id number] Patient Account Number: [id number] Date of Birth/Sex: 01-31-1920 (83 y.o. Female) Treating RN: Montey Hora Primary Care Diona Peregoy: Einar Pheasant Other Clinician: Referring Melessa Cowell: Einar Pheasant Treating Matraca Hunkins/Extender: Tito Dine in Treatment: 0 Clinic Level of Care Assessment Items TOOL 1 Quantity Score []  - Use when EandM and Procedure is performed on INITIAL visit 0 ASSESSMENTS - Nursing Assessment / Reassessment X - General Physical Exam (combine w/ comprehensive assessment (listed just below) when 1 20 performed on new pt. evals) X- 1 25 Comprehensive Assessment (HX, ROS, Risk Assessments, Wounds Hx, etc.) ASSESSMENTS - Wound and Skin Assessment / Reassessment []  - Dermatologic / Skin Assessment (not related to wound area) 0 ASSESSMENTS - Ostomy and/or Continence Assessment and Care []  - Incontinence Assessment and Management 0 []  - 0 Ostomy Care Assessment and Management (repouching, etc.) PROCESS - Coordination of Care X - Simple Patient / Family Education for ongoing care 1 15 []  - 0 Complex (extensive) Patient / Family Education for ongoing care X- 1 10 Staff obtains Programmer, systems, Records, Test Results / Process Orders []  - 0 Staff telephones HHA, Nursing Homes / Clarify orders / etc []  - 0 Routine Transfer to another Facility (non-emergent condition) []  - 0 Routine Hospital Admission (non-emergent condition) X- 1 15 New Admissions / Biomedical engineer / Ordering NPWT, Apligraf, etc. []  - 0 Emergency Hospital Admission (emergent condition) PROCESS - Special Needs []  -  Pediatric / Minor Patient Management 0 []  - 0 Isolation Patient Management []  - 0 Hearing / Language / Visual special needs []  - 0 Assessment of Community assistance (transportation, D/C planning, etc.) []  - 0 Additional assistance / Altered mentation []  - 0 Support Surface(s) Assessment (bed, cushion, seat, etc.) Christy Cisneros, Christy Cisneros ([id number]) INTERVENTIONS - Miscellaneous []  - External ear exam 0 []  - 0 Patient Transfer (multiple staff / Harrel Lemon  Lift / Similar devices) []  - 0 Simple Staple / Suture removal (25 or less) []  - 0 Complex Staple / Suture removal (26 or more) []  - 0 Hypo/Hyperglycemic Management (do not check if billed separately) []  - 0 Ankle / Brachial Index (ABI) - do not check if billed separately Has the patient been seen at the hospital within the last three years: Yes Total Score: 85 Level Of Care: New/Established - Level 3 Electronic Signature(s) Signed: 05/24/2017 5:07:57 PM By: Montey Hora Entered By: Montey Hora on 05/24/2017 11:49:39 Christy Cisneros ([id number]) -------------------------------------------------------------------------------- Encounter Discharge Information Details Patient Name: Christy Cisneros, Christy Cisneros. Date of Service: 05/24/2017 8:00 AM Medical Record Number: [id number] Patient Account Number: [id number] Date of Birth/Sex: 04-09-1920 (83 y.o. Female) Treating RN: Montey Hora Primary Care Jillyn Stacey: Einar Pheasant Other Clinician: Referring Aurthur Wingerter: Einar Pheasant Treating Kyaire Gruenewald/Extender: Tito Dine in Treatment: 0 Encounter Discharge Information Items Discharge Pain Level: 0 Discharge Condition: Stable Ambulatory Status: Ambulatory Discharge Destination: Home Private Transportation: Auto Accompanied By: friend Schedule Follow-up Appointment: Yes Medication Reconciliation completed and provided No to Patient/Care Siedah Sedor: Clinical Summary of Care: Electronic Signature(s) Signed: 05/24/2017  11:52:54 AM By: Montey Hora Entered By: Montey Hora on 05/24/2017 11:52:54 Christy Cisneros ([id number]) -------------------------------------------------------------------------------- Lower Extremity Assessment Details Patient Name: Christy Cisneros. Date of Service: 05/24/2017 8:00 AM Medical Record Number: [id number] Patient Account Number: [id number] Date of Birth/Sex: 03/05/1920 (83 y.o. Female) Treating RN: Montey Hora Primary Care Suhailah Kwan: Einar Pheasant Other Clinician: Referring Dewain Platz: Einar Pheasant Treating Kiing Deakin/Extender: Tito Dine in Treatment: 0 Edema Assessment Assessed: [Left: No] [Right: No] Edema: [Left: No] [Right: No] Vascular Assessment Pulses: Dorsalis Pedis Palpable: [Left:Yes] [Right:Yes] Doppler Audible: [Left:Yes] [Right:Yes] Posterior Tibial Palpable: [Left:Yes] [Right:Yes] Doppler Audible: [Left:Yes] [Right:Yes] Extremity colors, hair growth, and conditions: Extremity Color: [Left:Hyperpigmented] [Right:Hyperpigmented] Hair Growth on Extremity: [Left:Yes] [Right:Yes] Temperature of Extremity: [Left:Warm] [Right:Warm] Capillary Refill: [Left:< 3 seconds] [Right:< 3 seconds] Toe Nail Assessment Left: Right: Thick: Yes Yes Discolored: Yes Yes Deformed: No No Improper Length and Hygiene: No No Electronic Signature(s) Signed: 05/24/2017 8:35:24 AM By: Montey Hora Previous Signature: 05/24/2017 8:34:25 AM Version By: Montey Hora Entered By: Montey Hora on 05/24/2017 08:35:23 Christy Cisneros ([id number]) -------------------------------------------------------------------------------- Multi Wound Chart Details Patient Name: Christy Cisneros. Date of Service: 05/24/2017 8:00 AM Medical Record Number: [id number] Patient Account Number: [id number] Date of Birth/Sex: 05-Aug-1919 (83 y.o. Female) Treating RN: Montey Hora Primary Care Zakyla Tonche: Einar Pheasant Other Clinician: Referring Fleurette Woolbright:  Einar Pheasant Treating Kyia Rhude/Extender: Tito Dine in Treatment: 0 Vital Signs Height(in): 61 Pulse(bpm): 72 Weight(lbs): 120 Blood Pressure(mmHg): 133/70 Body Mass Index(BMI): 23 Temperature(F): Respiratory Rate 16 (breaths/min): Photos: [3:No Photos] [N/A:N/A] Wound Location: [3:Right Lower Leg - Anterior] [N/A:N/A] Wounding Event: [3:Trauma] [N/A:N/A] Primary Etiology: [3:Trauma, Other] [N/A:N/A] Comorbid History: [3:Glaucoma, Sleep Apnea, Osteoarthritis] [N/A:N/A] Date Acquired: [3:04/25/2017] [N/A:N/A] Weeks of Treatment: [3:0] [N/A:N/A] Wound Status: [3:Open] [N/A:N/A] Measurements L x W x D [3:1.2x2x0.1] [N/A:N/A] (cm) Area (cm) : [3:1.885] [N/A:N/A] Volume (cm) : [3:0.188] [N/A:N/A] Classification: [3:Full Thickness Without Exposed Support Structures] [N/A:N/A] Exudate Amount: [3:Large] [N/A:N/A] Exudate Type: [3:Serous] [N/A:N/A] Exudate Color: [3:amber] [N/A:N/A] Wound Margin: [3:Flat and Intact] [N/A:N/A] Granulation Amount: [3:Small (1-33%)] [N/A:N/A] Granulation Quality: [3:Red] [N/A:N/A] Necrotic Amount: [3:Large (67-100%)] [N/A:N/A] Necrotic Tissue: [3:Eschar, Adherent Slough] [N/A:N/A] Exposed Structures: [3:Fascia: No Fat Layer (Subcutaneous Tissue) Exposed: No Tendon: No Muscle: No Joint: No Bone: No] [N/A:N/A] Epithelialization: [3:None] [N/A:N/A] Debridement: [3:Debridement ([id number])] [N/A:N/A] Pre-procedure [3:08:42] [N/A:N/A] Verification/Time Out Taken: Pain Control: [3:Lidocaine 4% Topical Solution] [N/A:N/A] Tissue  Debrided: [3:Necrotic/Eschar, Fibrin/Slough, Subcutaneous] [N/A:N/A] Level: Skin/Subcutaneous Tissue N/A N/A Debridement Area (sq cm): 2.4 N/A N/A Instrument: Curette N/A N/A Bleeding: Minimum N/A N/A Hemostasis Achieved: Pressure N/A N/A Procedural Pain: 0 N/A N/A Post Procedural Pain: 0 N/A N/A Debridement Treatment Procedure was tolerated well N/A N/A Response: Post Debridement 1.2x2x0.2 N/A  N/A Measurements L x W x D (cm) Post Debridement Volume: 0.377 N/A N/A (cm) Periwound Skin Texture: Excoriation: No N/A N/A Induration: No Callus: No Crepitus: No Rash: No Scarring: No Periwound Skin Moisture: Maceration: No N/A N/A Dry/Scaly: No Periwound Skin Color: Erythema: Yes N/A N/A Atrophie Blanche: No Cyanosis: No Ecchymosis: No Hemosiderin Staining: No Mottled: No Pallor: No Rubor: No Erythema Location: Circumferential N/A N/A Temperature: No Abnormality N/A N/A Tenderness on Palpation: Yes N/A N/A Wound Preparation: Ulcer Cleansing: N/A N/A Rinsed/Irrigated with Saline Topical Anesthetic Applied: Other: lidocaine 4% Procedures Performed: Debridement N/A N/A Treatment Notes Electronic Signature(s) Signed: 05/24/2017 5:28:55 PM By: Linton Ham MD Entered By: Linton Ham on 05/24/2017 09:07:39 Christy Cisneros ([id number]) -------------------------------------------------------------------------------- [place] Details Patient Name: Christy Cisneros, Christy Cisneros. Date of Service: 05/24/2017 8:00 AM Medical Record Number: [id number] Patient Account Number: [id number] Date of Birth/Sex: [birth year] (83 y.o. Female) Treating RN: Montey Hora Primary Care [place] Haub: Einar Pheasant Other Clinician: Referring Jmichael Gille: Einar Pheasant Treating Rigby Leonhardt/Extender: Tito Dine in Treatment: 0 Active Inactive ` Abuse / Safety / Falls / Self Care Management Nursing Diagnoses: Potential for falls Goals: Patient will not experience any injury related to falls Date Initiated: 05/24/2017 Target Resolution Date: 08/05/2017 Goal Status: Active Interventions: Assess fall risk on admission and as needed Notes: ` Orientation to the Wound Care Program Nursing Diagnoses: Knowledge deficit related to the wound healing center program Goals: Patient/caregiver will verbalize understanding of the [place] Date  Initiated: 05/24/2017 Target Resolution Date: 08/05/2017 Goal Status: Active Interventions: Provide education on orientation to the wound center Notes: ` Wound/Skin Impairment Nursing Diagnoses: Impaired tissue integrity Goals: Ulcer/skin breakdown will heal within 14 weeks Date Initiated: 05/24/2017 Target Resolution Date: 08/05/2017 Goal Status: Active Interventions: Christy Cisneros, Christy Cisneros ([id number]) Assess patient/caregiver ability to obtain necessary supplies Assess patient/caregiver ability to perform ulcer/skin care regimen upon admission and as needed Assess ulceration(s) every visit Notes: Electronic Signature(s) Signed: 05/24/2017 5:07:57 PM By: Montey Hora Entered By: Montey Hora on 05/24/2017 08:43:50 Christy Cisneros ([id number]) -------------------------------------------------------------------------------- Pain Assessment Details Patient Name: Christy Cisneros. Date of Service: 05/24/2017 8:00 AM Medical Record Number: [id number] Patient Account Number: [id number] Date of Birth/Sex: [birth year] (83 y.o. Female) Treating RN: Montey Hora Primary Care Caz Weaver: Einar Pheasant Other Clinician: Referring Sim Choquette: Einar Pheasant Treating Aiken Withem/Extender: Tito Dine in Treatment: 0 Active Problems Location of Pain Severity and Description of Pain Patient Has Paino Yes Site Locations Pain Location: Pain in Ulcers With Dressing Change: Yes Duration of the Pain. Constant / Intermittento Constant Pain Management and Medication Current Pain Management: Notes Topical or injectable lidocaine is offered to patient for acute pain when surgical debridement is performed. If needed, Patient is instructed to use over the counter pain medication for the following 24-48 hours after debridement. Wound care MDs do not prescribed pain medications. Patient has chronic pain or uncontrolled pain. Patient has been instructed to make an appointment with  their Primary Care Physician for pain management. Electronic Signature(s) Signed: 05/24/2017 5:07:57 PM By: Montey Hora Entered By: Montey Hora on 05/24/2017 08:14:23 Christy Cisneros ([id number]) -------------------------------------------------------------------------------- Patient/Caregiver Education Details Patient Name: Christy Cisneros, Christy Cisneros. Date of Service:  05/24/2017 8:00 AM Medical Record Number: [id number] Patient Account Number: [id number] Date of Birth/Gender: 09-Sep-1919 (83 y.o. Female) Treating RN: Montey Hora Primary Care Physician: Einar Pheasant Other Clinician: Referring Physician: Einar Pheasant Treating Physician/Extender: Tito Dine in Treatment: 0 Education Assessment Education Provided To: Patient Education Topics Provided Wound/Skin Impairment: Handouts: Other: do not get dressing wet please Methods: Explain/Verbal Responses: State content correctly Electronic Signature(s) Signed: 05/24/2017 5:07:57 PM By: Montey Hora Entered By: Montey Hora on 05/24/2017 11:54:06 Christy Cisneros ([id number]) -------------------------------------------------------------------------------- Wound Assessment Details Patient Name: Christy Cisneros. Date of Service: 05/24/2017 8:00 AM Medical Record Number: [id number] Patient Account Number: [id number] Date of Birth/Sex: 1919-10-05 (83 y.o. Female) Treating RN: Montey Hora Primary Care Riely Oetken: Einar Pheasant Other Clinician: Referring Mariell Nester: Einar Pheasant Treating Alaine Loughney/Extender: Ricard Dillon Weeks in Treatment: 0 Wound Status Wound Number: 3 Primary Etiology: Trauma, Other Wound Location: Right Lower Leg - Anterior Wound Status: Open Wounding Event: Trauma Comorbid History: Glaucoma, Sleep Apnea, Osteoarthritis Date Acquired: 04/25/2017 Weeks Of Treatment: 0 Clustered Wound: No Photos Photo Uploaded By: Montey Hora on 05/24/2017 16:26:24 Wound  Measurements Length: (cm) 1.2 Width: (cm) 2 Depth: (cm) 0.1 Area: (cm) 1.885 Volume: (cm) 0.188 % Reduction in Area: % Reduction in Volume: Epithelialization: None Tunneling: No Undermining: No Wound Description Full Thickness Without Exposed Support Classification: Structures Wound Margin: Flat and Intact Exudate Large Amount: Exudate Type: Serous Exudate Color: amber Foul Odor After Cleansing: No Slough/Fibrino Yes Wound Bed Granulation Amount: Small (1-33%) Exposed Structure Granulation Quality: Red Fascia Exposed: No Necrotic Amount: Large (67-100%) Fat Layer (Subcutaneous Tissue) Exposed: No Necrotic Quality: Eschar, Adherent Slough Tendon Exposed: No Muscle Exposed: No Joint Exposed: No Bone Exposed: No Christy Cisneros, Christy Cisneros. ([id number]) Periwound Skin Texture Texture Color No Abnormalities Noted: No No Abnormalities Noted: No Callus: No Atrophie Blanche: No Crepitus: No Cyanosis: No Excoriation: No Ecchymosis: No Induration: No Erythema: Yes Rash: No Erythema Location: Circumferential Scarring: No Hemosiderin Staining: No Mottled: No Moisture Pallor: No No Abnormalities Noted: No Rubor: No Dry / Scaly: No Maceration: No Temperature / Pain Temperature: No Abnormality Tenderness on Palpation: Yes Wound Preparation Ulcer Cleansing: Rinsed/Irrigated with Saline Topical Anesthetic Applied: Other: lidocaine 4%, Treatment Notes Wound #3 (Right, Anterior Lower Leg) 1. Cleansed with: Clean wound with Normal Saline 2. Anesthetic Topical Lidocaine 4% cream to wound bed prior to debridement 4. Dressing Applied: Prisma Ag 5. Secondary Dressing Applied ABD Pad Non-Adherent pad 7. Secured with Other (specify in notes) Notes kerlix and coban wrap, unna to Engineer, production) Signed: 05/24/2017 5:07:57 PM By: Montey Hora Entered By: Montey Hora on 05/24/2017 08:32:16 Christy Cisneros  ([id number]) -------------------------------------------------------------------------------- [place] Details Patient Name: Christy Cisneros. Date of Service: 05/24/2017 8:00 AM Medical Record Number: [id number] Patient Account Number: [id number] Date of Birth/Sex: 11-Dec-1919 (83 y.o. Female) Treating RN: Montey Hora Primary Care Dechelle Attaway: Einar Pheasant Other Clinician: Referring Inocencia Murtaugh: Einar Pheasant Treating Jesseka Drinkard/Extender: Tito Dine in Treatment: 0 Vital Signs Time Taken: 08:20 Pulse (bpm): 72 Height (in): 61 Respiratory Rate (breaths/min): 16 Source: Measured Blood Pressure (mmHg): 133/70 Weight (lbs): 120 Reference Range: 80 - 120 mg / dl Source: Measured Body Mass Index (BMI): 22.7 Electronic Signature(s) Signed: 05/24/2017 5:07:57 PM By: Montey Hora Entered By: Montey Hora on 05/24/2017 08:21:10

## 2017-05-26 NOTE — Progress Notes (Signed)
Christy Cisneros, Christy Cisneros ([id number]) Visit Report for 05/24/2017 Abuse/Suicide Risk Screen Details Patient Name: Christy Cisneros, Christy Cisneros. Date of Service: 05/24/2017 8:00 AM Medical Record Number: [id number] Patient Account Number: [id number] Date of Birth/Sex: 1919/08/31 (83 y.o. Female) Treating RN: Montey Hora Primary Care Aurielle Slingerland: Einar Pheasant Other Clinician: Referring Ammie Warrick: Einar Pheasant Treating Joud Pettinato/Extender: Tito Dine in Treatment: 0 Abuse/Suicide Risk Screen Items Answer ABUSE/SUICIDE RISK SCREEN: Has anyone close to you tried to hurt or harm you recentlyo No Do you feel uncomfortable with anyone in your familyo No Has anyone forced you do things that you didnot want to doo No Do you have any thoughts of harming yourselfo No Patient displays signs or symptoms of abuse and/or neglect. No Electronic Signature(s) Signed: 05/24/2017 5:07:57 PM By: Montey Hora Entered By: Montey Hora on 05/24/2017 08:19:16 Christy Cisneros ([id number]) -------------------------------------------------------------------------------- Activities of Daily Living Details Patient Name: Christy Cisneros, Christy Cisneros. Date of Service: 05/24/2017 8:00 AM Medical Record Number: [id number] Patient Account Number: [id number] Date of Birth/Sex: August 10, 1919 (83 y.o. Female) Treating RN: Montey Hora Primary Care Debbi Strandberg: Einar Pheasant Other Clinician: Referring Raushanah Osmundson: Einar Pheasant Treating Boleslaus Holloway/Extender: Tito Dine in Treatment: 0 Activities of Daily Living Items Answer Activities of Daily Living (Please select one for each item) Drive Automobile Not Able Take Medications Completely Able Use Telephone Completely Able Care for Appearance Completely Able Use Toilet Completely Able Bath / Shower Completely Able Dress Self Completely Able Feed Self Completely Able Walk Completely Able Get In / Out Bed Completely Able Housework Completely  Able Prepare Meals Completely [place] for Self Need Assistance Electronic Signature(s) Signed: 05/24/2017 5:07:57 PM By: Montey Hora Entered By: Montey Hora on 05/24/2017 08:19:47 Christy Cisneros ([id number]) -------------------------------------------------------------------------------- Education Assessment Details Patient Name: Christy Cisneros. Date of Service: 05/24/2017 8:00 AM Medical Record Number: [id number] Patient Account Number: [id number] Date of Birth/Sex: March 12, 1920 (83 y.o. Female) Treating RN: Montey Hora Primary Care Nika Yazzie: Einar Pheasant Other Clinician: Referring Ector Laurel: Einar Pheasant Treating Juwan Vences/Extender: Tito Dine in Treatment: 0 Primary Learner Assessed: Caregiver Reason Patient is not Primary Learner: wound location Learning Preferences/Education Level/Primary Language Learning Preference: Explanation, Demonstration Highest Education Level: College or Above Preferred Language: English Cognitive Barrier Assessment/Beliefs Language Barrier: No Translator Needed: No Memory Deficit: No Emotional Barrier: No Cultural/Religious Beliefs Affecting Medical Care: No Physical Barrier Assessment Impaired Vision: No Impaired Hearing: No Decreased Hand dexterity: No Knowledge/Comprehension Assessment Knowledge Level: Medium Comprehension Level: Medium Ability to understand written Medium instructions: Ability to understand verbal Medium instructions: Motivation Assessment Anxiety Level: Calm Cooperation: Cooperative Education Importance: Acknowledges Need Interest in Health Problems: Asks Questions Perception: Coherent Willingness to Engage in Self- Medium Management Activities: Readiness to Engage in Self- Medium Management Activities: Electronic Signature(s) Signed: 05/24/2017 5:07:57 PM By: Montey Hora Entered By: Montey Hora on 05/24/2017 08:20:14 Christy Cisneros ([id number]) -------------------------------------------------------------------------------- Fall Risk Assessment Details Patient Name: Christy Cisneros. Date of Service: 05/24/2017 8:00 AM Medical Record Number: [id number] Patient Account Number: [id number] Date of Birth/Sex: Aug 15, 1919 (83 y.o. Female) Treating RN: Montey Hora Primary Care Tequilla Cousineau: Einar Pheasant Other Clinician: Referring Domanique Luckett: Einar Pheasant Treating Sricharan Lacomb/Extender: Tito Dine in Treatment: 0 Fall Risk Assessment Items Have you had 2 or more falls in the last 12 monthso 0 No Have you had any fall that resulted in injury in the last 12 monthso 0 No FALL RISK ASSESSMENT: History of falling - immediate or within 3 months 0 No Secondary diagnosis 0 No Ambulatory aid  None/bed rest/wheelchair/nurse 0 Yes Crutches/cane/walker 0 No Furniture 0 No IV Access/Saline Lock 0 No Gait/Training Normal/bed rest/immobile 0 No Weak 10 Yes Impaired 0 No Mental Status Oriented to own ability 0 Yes Electronic Signature(s) Signed: 05/24/2017 5:07:57 PM By: Montey Hora Entered By: Montey Hora on 05/24/2017 08:20:29 Christy Cisneros ([id number]) -------------------------------------------------------------------------------- Foot Assessment Details Patient Name: Christy Cisneros, Christy Cisneros. Date of Service: 05/24/2017 8:00 AM Medical Record Number: [id number] Patient Account Number: [id number] Date of Birth/Sex: July 03, 1920 (83 y.o. Female) Treating RN: Montey Hora Primary Care Philana Younis: Einar Pheasant Other Clinician: Referring Payden Bonus: Einar Pheasant Treating Jemari Hallum/Extender: Tito Dine in Treatment: 0 Foot Assessment Items Site Locations + = Sensation present, - = Sensation absent, C = Callus, U = Ulcer R = Redness, W = Warmth, M = Maceration, PU = Pre-ulcerative lesion F = Fissure, S = Swelling, D = Dryness Assessment Right: Left: Other Deformity: No  No Prior Foot Ulcer: No No Prior Amputation: No No Charcot Joint: No No Ambulatory Status: Ambulatory Without Help Gait: Steady Electronic Signature(s) Signed: 05/24/2017 8:34:55 AM By: Montey Hora Entered By: Montey Hora on 05/24/2017 08:34:55 Christy Cisneros ([id number]) -------------------------------------------------------------------------------- Nutrition Risk Assessment Details Patient Name: Christy Cisneros. Date of Service: 05/24/2017 8:00 AM Medical Record Number: [id number] Patient Account Number: [id number] Date of Birth/Sex: [birth year] (83 y.o. Female) Treating RN: Montey Hora Primary Care Dequon Schnebly: Einar Pheasant Other Clinician: Referring Ancel Easler: Einar Pheasant Treating Trask Vosler/Extender: Tito Dine in Treatment: 0 Height (in): 62 Weight (lbs): 102.9 Body Mass Index (BMI): 18.8 Nutrition Risk Assessment Items NUTRITION RISK SCREEN: I have an illness or condition that made me change the kind and/or amount of 0 No food I eat I eat fewer than two meals per day 0 No I eat few fruits and vegetables, or milk products 0 No I have three or more drinks of beer, liquor or wine almost every day 0 No I have tooth or mouth problems that make it hard for me to eat 0 No I don't always have enough money to buy the food I need 0 No I eat alone most of the time 0 No I take three or more different prescribed or over-the-counter drugs a day 1 Yes Without wanting to, I have lost or gained 10 pounds in the last six months 0 No I am not always physically able to shop, cook and/or feed myself 0 No Nutrition Protocols Good Risk Protocol 0 No interventions needed Moderate Risk Protocol Electronic Signature(s) Signed: 05/24/2017 5:07:57 PM By: Montey Hora Entered By: Montey Hora on 05/24/2017 08:20:36

## 2017-05-31 ENCOUNTER — Encounter: Payer: Medicare Other | Admitting: Physician Assistant

## 2017-05-31 DIAGNOSIS — Z885 Allergy status to narcotic agent status: Secondary | ICD-10-CM | POA: Diagnosis not present

## 2017-05-31 DIAGNOSIS — I87321 Chronic venous hypertension (idiopathic) with inflammation of right lower extremity: Secondary | ICD-10-CM | POA: Diagnosis not present

## 2017-05-31 DIAGNOSIS — H409 Unspecified glaucoma: Secondary | ICD-10-CM | POA: Diagnosis not present

## 2017-05-31 DIAGNOSIS — S81811A Laceration without foreign body, right lower leg, initial encounter: Secondary | ICD-10-CM | POA: Diagnosis not present

## 2017-05-31 DIAGNOSIS — Z881 Allergy status to other antibiotic agents status: Secondary | ICD-10-CM | POA: Diagnosis not present

## 2017-05-31 DIAGNOSIS — M1990 Unspecified osteoarthritis, unspecified site: Secondary | ICD-10-CM | POA: Diagnosis not present

## 2017-05-31 DIAGNOSIS — L97211 Non-pressure chronic ulcer of right calf limited to breakdown of skin: Secondary | ICD-10-CM | POA: Diagnosis not present

## 2017-06-01 DIAGNOSIS — H52221 Regular astigmatism, right eye: Secondary | ICD-10-CM | POA: Diagnosis not present

## 2017-06-01 DIAGNOSIS — H524 Presbyopia: Secondary | ICD-10-CM | POA: Diagnosis not present

## 2017-06-01 DIAGNOSIS — H5211 Myopia, right eye: Secondary | ICD-10-CM | POA: Diagnosis not present

## 2017-06-01 DIAGNOSIS — H1820 Unspecified corneal edema: Secondary | ICD-10-CM | POA: Diagnosis not present

## 2017-06-01 DIAGNOSIS — H35363 Drusen (degenerative) of macula, bilateral: Secondary | ICD-10-CM | POA: Diagnosis not present

## 2017-06-01 DIAGNOSIS — H353131 Nonexudative age-related macular degeneration, bilateral, early dry stage: Secondary | ICD-10-CM | POA: Diagnosis not present

## 2017-06-01 DIAGNOSIS — H47233 Glaucomatous optic atrophy, bilateral: Secondary | ICD-10-CM | POA: Diagnosis not present

## 2017-06-01 DIAGNOSIS — H16142 Punctate keratitis, left eye: Secondary | ICD-10-CM | POA: Diagnosis not present

## 2017-06-01 DIAGNOSIS — H401123 Primary open-angle glaucoma, left eye, severe stage: Secondary | ICD-10-CM | POA: Diagnosis not present

## 2017-06-01 DIAGNOSIS — H53121 Transient visual loss, right eye: Secondary | ICD-10-CM | POA: Diagnosis not present

## 2017-06-01 DIAGNOSIS — H04123 Dry eye syndrome of bilateral lacrimal glands: Secondary | ICD-10-CM | POA: Diagnosis not present

## 2017-06-01 DIAGNOSIS — H401113 Primary open-angle glaucoma, right eye, severe stage: Secondary | ICD-10-CM | POA: Diagnosis not present

## 2017-06-02 DIAGNOSIS — G4730 Sleep apnea, unspecified: Secondary | ICD-10-CM | POA: Diagnosis not present

## 2017-06-02 DIAGNOSIS — I87321 Chronic venous hypertension (idiopathic) with inflammation of right lower extremity: Secondary | ICD-10-CM | POA: Diagnosis not present

## 2017-06-02 DIAGNOSIS — H409 Unspecified glaucoma: Secondary | ICD-10-CM | POA: Diagnosis not present

## 2017-06-02 DIAGNOSIS — S81811D Laceration without foreign body, right lower leg, subsequent encounter: Secondary | ICD-10-CM | POA: Diagnosis not present

## 2017-06-02 DIAGNOSIS — M1991 Primary osteoarthritis, unspecified site: Secondary | ICD-10-CM | POA: Diagnosis not present

## 2017-06-02 NOTE — Progress Notes (Addendum)
ANACRISTINA, Cisneros ([id number]) Visit Report for 05/31/2017 Arrival Information Details Patient Name: Christy Cisneros, Christy Cisneros. Date of Service: 05/31/2017 10:00 AM Medical Record Number: [id number] Patient Account Number: [id number] Date of Birth/Sex: [birth year] (83 y.o. Female) Treating RN: Montey Hora Primary Care Rashon Rezek: Einar Pheasant Other Clinician: Referring Ruhaan Nordahl: Einar Pheasant Treating Caral Whan/Extender: Melburn Hake, HOYT Weeks in Treatment: 1 Visit Information History Since Last Visit Added or deleted any medications: No Patient Arrived: Ambulatory Any new allergies or adverse reactions: No Arrival Time: 10:05 Had a fall or experienced change in No Accompanied By: friend activities of daily living that may affect Transfer Assistance: None risk of falls: Patient Identification Verified: Yes Signs or symptoms of abuse/neglect since last visito No Secondary Verification Process Completed: Yes Hospitalized since last visit: No Patient Has Alerts: Yes Has Dressing in Place as Prescribed: Yes Patient Alerts: aspirin 81, plavix Has Compression in Place as Prescribed: Yes ABI R 1.09 Pain Present Now: No Electronic Signature(s) Signed: 05/31/2017 4:29:50 PM By: Montey Hora Entered By: Montey Hora on 05/31/2017 10:06:09 Christy Cisneros ([id number]) -------------------------------------------------------------------------------- Clinic Level of Care Assessment Details Patient Name: Christy Cisneros. Date of Service: 05/31/2017 10:00 AM Medical Record Number: [id number] Patient Account Number: [id number] Date of Birth/Sex: 04-16-1920 (83 y.o. Female) Treating RN: Montey Hora Primary Care Brighten Buzzelli: Einar Pheasant Other Clinician: Referring Videl Nobrega: Einar Pheasant Treating Brysin Towery/Extender: Melburn Hake, HOYT Weeks in Treatment: 1 Clinic Level of Care Assessment Items TOOL 4 Quantity Score []  - Use when only an EandM is performed on FOLLOW-UP visit  0 ASSESSMENTS - Nursing Assessment / Reassessment X - Reassessment of Co-morbidities (includes updates in patient status) 1 10 X- 1 5 Reassessment of Adherence to Treatment Plan ASSESSMENTS - Wound and Skin Assessment / Reassessment X - Simple Wound Assessment / Reassessment - one wound 1 5 []  - 0 Complex Wound Assessment / Reassessment - multiple wounds []  - 0 Dermatologic / Skin Assessment (not related to wound area) ASSESSMENTS - Focused Assessment []  - Circumferential Edema Measurements - multi extremities 0 []  - 0 Nutritional Assessment / Counseling / Intervention X- 1 5 Lower Extremity Assessment (monofilament, tuning fork, pulses) []  - 0 Peripheral Arterial Disease Assessment (using hand held doppler) ASSESSMENTS - Ostomy and/or Continence Assessment and Care []  - Incontinence Assessment and Management 0 []  - 0 Ostomy Care Assessment and Management (repouching, etc.) PROCESS - Coordination of Care X - Simple Patient / Family Education for ongoing care 1 15 []  - 0 Complex (extensive) Patient / Family Education for ongoing care X- 1 10 Staff obtains Programmer, systems, Records, Test Results / Process Orders []  - 0 Staff telephones HHA, Nursing Homes / Clarify orders / etc []  - 0 Routine Transfer to another Facility (non-emergent condition) []  - 0 Routine Hospital Admission (non-emergent condition) []  - 0 New Admissions / Biomedical engineer / Ordering NPWT, Apligraf, etc. []  - 0 Emergency Hospital Admission (emergent condition) X- 1 10 Simple Discharge Coordination KATHLYNE, LOUD ([id number]) []  - 0 Complex (extensive) Discharge Coordination PROCESS - Special Needs []  - Pediatric / Minor Patient Management 0 []  - 0 Isolation Patient Management []  - 0 Hearing / Language / Visual special needs []  - 0 Assessment of Community assistance (transportation, D/C planning, etc.) []  - 0 Additional assistance / Altered mentation []  - 0 Support Surface(s) Assessment  (bed, cushion, seat, etc.) INTERVENTIONS - Wound Cleansing / Measurement X - Simple Wound Cleansing - one wound 1 5 []  - 0 Complex Wound Cleansing - multiple wounds X- 1 5 Wound Imaging (  photographs - any number of wounds) []  - 0 Wound Tracing (instead of photographs) X- 1 5 Simple Wound Measurement - one wound []  - 0 Complex Wound Measurement - multiple wounds INTERVENTIONS - Wound Dressings []  - Small Wound Dressing one or multiple wounds 0 []  - 0 Medium Wound Dressing one or multiple wounds X- 1 20 Large Wound Dressing one or multiple wounds []  - 0 Application of Medications - topical []  - 0 Application of Medications - injection INTERVENTIONS - Miscellaneous []  - External ear exam 0 []  - 0 Specimen Collection (cultures, biopsies, blood, body fluids, etc.) []  - 0 Specimen(s) / Culture(s) sent or taken to Lab for analysis []  - 0 Patient Transfer (multiple staff / Civil Service fast streamer / Similar devices) []  - 0 Simple Staple / Suture removal (25 or less) []  - 0 Complex Staple / Suture removal (26 or more) []  - 0 Hypo / Hyperglycemic Management (close monitor of Blood Glucose) []  - 0 Ankle / Brachial Index (ABI) - do not check if billed separately X- 1 5 Vital Signs PROSPERITY, DARROUGH ([id number]) Has the patient been seen at the hospital within the last three years: Yes Total Score: 100 Level Of Care: New/Established - Level 3 Electronic Signature(s) Signed: 05/31/2017 4:29:50 PM By: Montey Hora Entered By: Montey Hora on 05/31/2017 15:38:50 Christy Cisneros ([id number]) -------------------------------------------------------------------------------- Encounter Discharge Information Details Patient Name: Christy Cisneros. Date of Service: 05/31/2017 10:00 AM Medical Record Number: [id number] Patient Account Number: [id number] Date of Birth/Sex: 04/01/1920 (83 y.o. Female) Treating RN: Montey Hora Primary Care Charde Macfarlane: Einar Pheasant Other  Clinician: Referring Alfred Eckley: Einar Pheasant Treating Aylin Rhoads/Extender: Melburn Hake, HOYT Weeks in Treatment: 1 Encounter Discharge Information Items Discharge Pain Level: 0 Discharge Condition: Stable Ambulatory Status: Ambulatory Discharge Destination: Home Transportation: Private Auto Accompanied By: friend Schedule Follow-up Appointment: Yes Medication Reconciliation completed and No provided to Patient/Care Tameka Hoiland: Provided on Clinical Summary of Care: 05/31/2017 Form Type Recipient Paper Patient FO Electronic Signature(s) Signed: 06/01/2017 8:54:06 AM By: Ruthine Dose Entered By: Ruthine Dose on 05/31/2017 11:00:32 Christy Cisneros ([id number]) -------------------------------------------------------------------------------- Lower Extremity Assessment Details Patient Name: Christy Cisneros. Date of Service: 05/31/2017 10:00 AM Medical Record Number: [id number] Patient Account Number: [id number] Date of Birth/Sex: 08/13/1919 (83 y.o. Female) Treating RN: Montey Hora Primary Care Troye Hiemstra: Einar Pheasant Other Clinician: Referring Lamia Mariner: Einar Pheasant Treating Charae Depaolis/Extender: Melburn Hake, HOYT Weeks in Treatment: 1 Vascular Assessment Pulses: Dorsalis Pedis Palpable: [Right:Yes] Posterior Tibial Extremity colors, hair growth, and conditions: Extremity Color: [Right:Hyperpigmented] Hair Growth on Extremity: [Right:Yes] Temperature of Extremity: [Right:Warm] Capillary Refill: [Right:< 3 seconds] Toe Nail Assessment Left: Right: Thick: Yes Discolored: No Deformed: No Improper Length and Hygiene: Yes Electronic Signature(s) Signed: 05/31/2017 4:29:50 PM By: Montey Hora Entered By: Montey Hora on 05/31/2017 10:38:48 Christy Cisneros ([id number]) -------------------------------------------------------------------------------- Multi Wound Chart Details Patient Name: Christy Cisneros. Date of Service: 05/31/2017 10:00 AM Medical  Record Number: [id number] Patient Account Number: [id number] Date of Birth/Sex: [birth year] (83 y.o. Female) Treating RN: Montey Hora Primary Care Aneli Zara: Einar Pheasant Other Clinician: Referring Jari Dipasquale: Einar Pheasant Treating Kimla Furth/Extender: Melburn Hake, HOYT Weeks in Treatment: 1 Vital Signs Height(in): 61 Pulse(bpm): 76 Weight(lbs): 120 Blood Pressure(mmHg): 121/54 Body Mass Index(BMI): 23 Temperature(F): 97.9 Respiratory Rate 18 (breaths/min): Photos: [3:No Photos] [N/A:N/A] Wound Location: [3:Right Lower Leg - Anterior] [N/A:N/A] Wounding Event: [3:Trauma] [N/A:N/A] Primary Etiology: [3:Trauma, Other] [N/A:N/A] Comorbid History: [3:Glaucoma, Sleep Apnea, Osteoarthritis] [N/A:N/A] Date Acquired: [3:04/25/2017] [N/A:N/A] Weeks of Treatment: [3:1] [N/A:N/A] Wound Status: [3:Open] [N/A:N/A] Measurements L x W  x D [3:1.2x1.6x0.1] [N/A:N/A] (cm) Area (cm) : [3:1.508] [N/A:N/A] Volume (cm) : [3:0.151] [N/A:N/A] % Reduction in Area: [3:20.00%] [N/A:N/A] % Reduction in Volume: [3:19.70%] [N/A:N/A] Classification: [3:Full Thickness Without Exposed Support Structures] [N/A:N/A] Exudate Amount: [3:Large] [N/A:N/A] Exudate Type: [3:Serous] [N/A:N/A] Exudate Color: [3:amber] [N/A:N/A] Wound Margin: [3:Flat and Intact] [N/A:N/A] Granulation Amount: [3:Large (67-100%)] [N/A:N/A] Granulation Quality: [3:Red] [N/A:N/A] Necrotic Amount: [3:Small (1-33%)] [N/A:N/A] Exposed Structures: [3:Fascia: No Fat Layer (Subcutaneous Tissue) Exposed: No Tendon: No Muscle: No Joint: No Bone: No] [N/A:N/A] Epithelialization: [3:Small (1-33%)] [N/A:N/A] Periwound Skin Texture: [3:Excoriation: No Induration: No Callus: No Crepitus: No] [N/A:N/A] Rash: No Scarring: No Periwound Skin Moisture: Maceration: No N/A N/A Dry/Scaly: No Periwound Skin Color: Atrophie Blanche: No N/A N/A Cyanosis: No Ecchymosis: No Erythema: No Hemosiderin Staining: No Mottled: No Pallor: No Rubor:  No Temperature: No Abnormality N/A N/A Tenderness on Palpation: Yes N/A N/A Wound Preparation: Ulcer Cleansing: N/A N/A Rinsed/Irrigated with Saline Topical Anesthetic Applied: Other: lidocaine 4% Treatment Notes Electronic Signature(s) Signed: 05/31/2017 4:29:50 PM By: Montey Hora Entered By: Montey Hora on 05/31/2017 10:39:13 Christy Cisneros ([id number]) -------------------------------------------------------------------------------- [place] Details Patient Name: LILYGRACE, RODICK. Date of Service: 05/31/2017 10:00 AM Medical Record Number: [id number] Patient Account Number: [id number] Date of Birth/Sex: 02-Feb-1920 (83 y.o. Female) Treating RN: Montey Hora Primary Care Jayni Prescher: Einar Pheasant Other Clinician: Referring Nakeda Lebron: Einar Pheasant Treating Lucresha Dismuke/Extender: Melburn Hake, HOYT Weeks in Treatment: 1 Active Inactive ` Abuse / Safety / Falls / Self Care Management Nursing Diagnoses: Potential for falls Goals: Patient will not experience any injury related to falls Date Initiated: 05/24/2017 Target Resolution Date: 08/05/2017 Goal Status: Active Interventions: Assess fall risk on admission and as needed Notes: ` Orientation to the Wound Care Program Nursing Diagnoses: Knowledge deficit related to the wound healing center program Goals: Patient/caregiver will verbalize understanding of the [place] Date Initiated: 05/24/2017 Target Resolution Date: 08/05/2017 Goal Status: Active Interventions: Provide education on orientation to the wound center Notes: ` Wound/Skin Impairment Nursing Diagnoses: Impaired tissue integrity Goals: Ulcer/skin breakdown will heal within 14 weeks Date Initiated: 05/24/2017 Target Resolution Date: 08/05/2017 Goal Status: Active Interventions: RILYNNE, LONSWAY ([id number]) Assess patient/caregiver ability to obtain necessary supplies Assess patient/caregiver ability to  perform ulcer/skin care regimen upon admission and as needed Assess ulceration(s) every visit Notes: Electronic Signature(s) Signed: 05/31/2017 4:29:50 PM By: Montey Hora Entered By: Montey Hora on 05/31/2017 10:38:56 Christy Cisneros ([id number]) -------------------------------------------------------------------------------- Pain Assessment Details Patient Name: Christy Cisneros. Date of Service: 05/31/2017 10:00 AM Medical Record Number: [id number] Patient Account Number: [id number] Date of Birth/Sex: 11-Mar-1920 (83 y.o. Female) Treating RN: Montey Hora Primary Care Twyla Dais: Einar Pheasant Other Clinician: Referring Preeya Cleckley: Einar Pheasant Treating Ludwin Flahive/Extender: Melburn Hake, HOYT Weeks in Treatment: 1 Active Problems Location of Pain Severity and Description of Pain Patient Has Paino Yes Site Locations Pain Location: Pain in Ulcers With Dressing Change: Yes Duration of the Pain. Constant / Intermittento Intermittent Pain Management and Medication Current Pain Management: Notes Topical or injectable lidocaine is offered to patient for acute pain when surgical debridement is performed. If needed, Patient is instructed to use over the counter pain medication for the following 24-48 hours after debridement. Wound care MDs do not prescribed pain medications. Patient has chronic pain or uncontrolled pain. Patient has been instructed to make an appointment with their Primary Care Physician for pain management. Electronic Signature(s) Signed: 05/31/2017 4:29:50 PM By: Montey Hora Entered By: Montey Hora on 05/31/2017 10:06:34 Christy Cisneros ([id number]) -------------------------------------------------------------------------------- Patient/Caregiver Education Details  Patient Name: LOZA, PRELL. Date of Service: 05/31/2017 10:00 AM Medical Record Number: [id number] Patient Account Number: [id number] Date of Birth/Gender: Sep 03, 1919 (83  y.o. Female) Treating RN: Montey Hora Primary Care Physician: Einar Pheasant Other Clinician: Referring Physician: Einar Pheasant Treating Physician/Extender: Sharalyn Ink in Treatment: 1 Education Assessment Education Provided To: Patient and Caregiver Education Topics Provided Venous: Handouts: Other: leg elevation Methods: Explain/Verbal Responses: State content correctly Electronic Signature(s) Signed: 05/31/2017 4:29:50 PM By: Montey Hora Entered By: Montey Hora on 05/31/2017 10:40:37 Christy Cisneros ([id number]) -------------------------------------------------------------------------------- Wound Assessment Details Patient Name: JOLINE, ENCALADA. Date of Service: 05/31/2017 10:00 AM Medical Record Number: [id number] Patient Account Number: [id number] Date of Birth/Sex: May 31, 1920 (83 y.o. Female) Treating RN: Montey Hora Primary Care Karin Griffith: Einar Pheasant Other Clinician: Referring Lashuna Tamashiro: Einar Pheasant Treating Judy Goodenow/Extender: Melburn Hake, HOYT Weeks in Treatment: 1 Wound Status Wound Number: 3 Primary Etiology: Trauma, Other Wound Location: Right Lower Leg - Anterior Wound Status: Open Wounding Event: Trauma Comorbid History: Glaucoma, Sleep Apnea, Osteoarthritis Date Acquired: 04/25/2017 Weeks Of Treatment: 1 Clustered Wound: No Photos Photo Uploaded By: Montey Hora on 05/31/2017 15:14:32 Wound Measurements Length: (cm) 1.2 Width: (cm) 1.6 Depth: (cm) 0.1 Area: (cm) 1.508 Volume: (cm) 0.151 % Reduction in Area: 20% % Reduction in Volume: 19.7% Epithelialization: Small (1-33%) Tunneling: No Undermining: No Wound Description Full Thickness Without Exposed Support Classification: Structures Wound Margin: Flat and Intact Exudate Large Amount: Exudate Type: Serous Exudate Color: amber Foul Odor After Cleansing: No Slough/Fibrino Yes Wound Bed Granulation Amount: Large (67-100%) Exposed  Structure Granulation Quality: Red Fascia Exposed: No Necrotic Amount: Small (1-33%) Fat Layer (Subcutaneous Tissue) Exposed: No Necrotic Quality: Adherent Slough Tendon Exposed: No Muscle Exposed: No Joint Exposed: No Bone Exposed: No AZENETH, CARBONELL. ([id number]) Periwound Skin Texture Texture Color No Abnormalities Noted: No No Abnormalities Noted: No Callus: No Atrophie Blanche: No Crepitus: No Cyanosis: No Excoriation: No Ecchymosis: No Induration: No Erythema: No Rash: No Hemosiderin Staining: No Scarring: No Mottled: No Pallor: No Moisture Rubor: No No Abnormalities Noted: No Dry / Scaly: No Temperature / Pain Maceration: No Temperature: No Abnormality Tenderness on Palpation: Yes Wound Preparation Ulcer Cleansing: Rinsed/Irrigated with Saline Topical Anesthetic Applied: Other: lidocaine 4%, Treatment Notes Wound #3 (Right, Anterior Lower Leg) 1. Cleansed with: Clean wound with Normal Saline 2. Anesthetic Topical Lidocaine 4% cream to wound bed prior to debridement 4. Dressing Applied: Other dressing (specify in notes) 5. Secondary Dressing Applied ABD Pad Notes silvercel, kerlix and coban wrap, unna to anchor Electronic Signature(s) Signed: 05/31/2017 4:29:50 PM By: Montey Hora Entered By: Montey Hora on 05/31/2017 10:17:04 Christy Cisneros ([id number]) -------------------------------------------------------------------------------- [place] Details Patient Name: Christy Cisneros. Date of Service: 05/31/2017 10:00 AM Medical Record Number: [id number] Patient Account Number: [id number] Date of Birth/Sex: Apr 17, 1920 (83 y.o. Female) Treating RN: Montey Hora Primary Care Deondrea Aguado: Einar Pheasant Other Clinician: Referring Mehek Grega: Einar Pheasant Treating Phyllistine Domingos/Extender: Melburn Hake, HOYT Weeks in Treatment: 1 Vital Signs Time Taken: 10:09 Temperature (F): 97.9 Height (in): 61 Pulse (bpm): 76 Weight (lbs):  120 Respiratory Rate (breaths/min): 18 Body Mass Index (BMI): 22.7 Blood Pressure (mmHg): 121/54 Reference Range: 80 - 120 mg / dl Electronic Signature(s) Signed: 05/31/2017 4:29:50 PM By: Montey Hora Entered By: Montey Hora on 05/31/2017 10:09:39

## 2017-06-02 NOTE — Progress Notes (Signed)
Christy Cisneros ([id number]) Visit Report for 05/31/2017 Chief Complaint Document Details Patient Name: Christy Cisneros, Christy Cisneros. Date of Service: 05/31/2017 10:00 AM Medical Record Number: [id number] Patient Account Number: [id number] Date of Birth/Sex: [birth year] (83 y.o. Female) Treating RN: Montey Hora Primary Care Provider: Einar Pheasant Other Clinician: Referring Provider: Einar Pheasant Treating Provider/Extender: Melburn Hake, HOYT Weeks in Treatment: 1 Information Obtained from: Patient Chief Complaint Right calf traumatic ulcer since early Dec 2015. 12/20/16; Patient here for a review of a non healing traumatic wound on the right lateral leg Electronic Signature(s) Signed: 05/31/2017 12:07:21 PM By: Worthy Keeler PA-C Entered By: Worthy Keeler on 05/31/2017 10:34:03 Christy Cisneros ([id number]) -------------------------------------------------------------------------------- HPI Details Patient Name: Christy Cisneros. Date of Service: 05/31/2017 10:00 AM Medical Record Number: [id number] Patient Account Number: [id number] Date of Birth/Sex: May 30, 1920 (83 y.o. Female) Treating RN: Montey Hora Primary Care Provider: Einar Pheasant Other Clinician: Referring Provider: Einar Pheasant Treating Provider/Extender: Melburn Hake, HOYT Weeks in Treatment: 1 History of Present Illness HPI Description: Very pleasant 83 year old with no significant past medical history. No diabetes or peripheral vascular disease. She traumatized her right anterior calf on a car door on 07/08/2014. She is slowly improving with regular debridements, compression, and. No significant pain. No claudication or rest pain. Right ABI 1.02. She walks a mile a day. No drainage. No fever or chills. She returns to clinic today and says that the ulceration has healed. 12/20/16; This is a 83 year old women who traumatized her right leg against a table while adjusting a shade in her home.  She suffered a skin tear. She was seen 2 weeks ago in her primary doctor's office and had a non stick dressing placed with kerlix and coban and that has apparently remained in place since then. She is complaining of pain but no systemic symptoms. I note she has been in this clinic 3 years ago for a similar presentation on the right leg.She was not seen by any of the current providers in our practice. She has compression stockings at home but does not use them. She has venous insufficiency. ABI's in this clinic 1.09 12/27/16; arrives today with the wound in better condition. Dimension smaller. We've been using Prisma 01/04/17; patient's wound on the right anterior calf looks a lot better this week dimension smaller base of the wound looks healthy. She has been using Prisma under 2 layer compression. She has chronic bilateral venous insufficiency and tells me she has stockings at home that she has not been wearing 01/11/17; continued improvement in the right anterior lateral wound which was initially trauma in the setting of chronic venous insufficiency. We have been using Prisma under 2 layer compression 01/18/17; continued improvement in the right anterior leg wound which was initially traumatic in the setting of chronic venous insufficiency. We have been using Prisma under 2 layer compression. Nice improvement in terms of wound dimensions 01/25/17; right anterior leg wound which was initially trauma in the setting of chronic venous insufficiency. She does not have much in the way of edema however skin damage secondary to chronic hemosiderin deposition is quite apparent right greater than left. Her wound today is totally healed over. She brought in a support stocking. READMISSION 05/24/17; this is a 83 year old woman who traumatized her right leg sometime in the last 2-3 weeks. She has very frail skin secondary to chronic venous insufficiency with marked bilateral hemosiderin deposition. She was seen in  her primary physician's office on 05/12/17 and treated with doxycycline and  Bactroban. We suggested compression stockings or at least support stockings last time although she is not compliant with these. Her ABI in this clinic was 1.09 05/31/17 on evaluation today patient appears to be doing fairly well in regard to her right lower extremity wound. Unfortunately the collagen is taking significantly to the wound bed likely due to the fact that this was wraps once and then left ear in the week before reevaluation here in our office. Obviously the wound is not draining very much. With that being said she is having no significant discomfort at this time which is good news. No fevers, chills, nausea, or vomiting noted at this time. Electronic Signature(s) Signed: 05/31/2017 12:07:21 PM By: Worthy Keeler PA-C Entered By: Worthy Keeler on 05/31/2017 10:55:20 Christy Cisneros ([id number]) -------------------------------------------------------------------------------- Physical Exam Details Patient Name: Christy Cisneros. Date of Service: 05/31/2017 10:00 AM Medical Record Number: [id number] Patient Account Number: [id number] Date of Birth/Sex: 02-21-1920 (83 y.o. Female) Treating RN: Montey Hora Primary Care Provider: Einar Pheasant Other Clinician: Referring Provider: Einar Pheasant Treating Provider/Extender: STONE III, HOYT Weeks in Treatment: 1 Constitutional Well-nourished and well-hydrated in no acute distress. Respiratory normal breathing without difficulty. clear to auscultation bilaterally. Cardiovascular regular rate and rhythm with normal S1, S2. Psychiatric this patient is able to make decisions and demonstrates good insight into disease process. Alert and Oriented x 3. pleasant and cooperative. Notes Patient's wound appears to be fairly good after cleansing with saline and calls today. There's no need for debridement and she had minimal discomfort during the  cleansing. The wrap does seem to be controlling her swelling very well. Electronic Signature(s) Signed: 05/31/2017 12:07:21 PM By: Worthy Keeler PA-C Entered By: Worthy Keeler on 05/31/2017 10:56:55 Christy Cisneros ([id number]) -------------------------------------------------------------------------------- Physician Orders Details Patient Name: TANGIA, PINARD. Date of Service: 05/31/2017 10:00 AM Medical Record Number: [id number] Patient Account Number: [id number] Date of Birth/Sex: 09-24-1919 (83 y.o. Female) Treating RN: Montey Hora Primary Care Provider: Einar Pheasant Other Clinician: Referring Provider: Einar Pheasant Treating Provider/Extender: Melburn Hake, HOYT Weeks in Treatment: 1 Verbal / Phone Orders: No Diagnosis Coding ICD-10 Coding Code Description L97.211 Non-pressure chronic ulcer of right calf limited to breakdown of skin I87.321 Chronic venous hypertension (idiopathic) with inflammation of right lower extremity Wound Cleansing Wound #3 Right,Anterior Lower Leg o Clean wound with Normal Saline. o May shower with protection. Anesthetic Wound #3 Right,Anterior Lower Leg o Topical Lidocaine 4% cream applied to wound bed prior to debridement Primary Wound Dressing Wound #3 Right,Anterior Lower Leg o Silvercel Non-Adherent Secondary Dressing Wound #3 Right,Anterior Lower Leg o ABD pad Dressing Change Frequency Wound #3 Right,Anterior Lower Leg o Change Dressing Monday, Wednesday, Friday - changed at [facility] on Wednesdays Follow-up Appointments Wound #3 Right,Anterior Lower Leg o Return Appointment in 1 week. Edema Control Wound #3 Right,Anterior Lower Leg o Kerlix and Coban - Right Lower Extremity - wrap lightly from toes to below the knee Additional Orders / Instructions Wound #3 Right,Anterior Lower Leg o Increase protein intake. Home Health Wound #3 Right,Anterior Lower Leg LAKENDRA, HELLING  ([id number]) o [place] for [place] Nurse may visit PRN to address patientos wound care needs. o FACE TO FACE ENCOUNTER: MEDICARE and MEDICAID PATIENTS: I certify that this patient is under my care and that I had a face-to-face encounter that meets the physician face-to-face encounter requirements with this patient on this date. The encounter with the patient was in whole or in  part for the following MEDICAL CONDITION: (primary reason for Home Healthcare) MEDICAL NECESSITY: I certify, that based on my findings, NURSING services are a medically necessary home health service. HOME BOUND STATUS: I certify that my clinical findings support that this patient is homebound (i.e., Due to illness or injury, pt requires aid of supportive devices such as crutches, cane, wheelchairs, walkers, the use of special transportation or the assistance of another person to leave their place of residence. There is a normal inability to leave the home and doing so requires considerable and taxing effort. Other absences are for medical reasons / religious services and are infrequent or of short duration when for other reasons). o If current dressing causes regression in wound condition, may D/C ordered dressing product/s and apply Normal Saline Moist Dressing daily until next [place] / Other MD appointment. [place] of regression in wound condition at [phone]. o Please direct any NON-WOUND related issues/requests for orders to patient's Primary Care Physician Electronic Signature(s) Signed: 05/31/2017 12:07:21 PM By: Worthy Keeler PA-C Signed: 05/31/2017 4:29:50 PM By: Montey Hora Entered By: Montey Hora on 05/31/2017 10:47:26 Christy Cisneros ([id number]) -------------------------------------------------------------------------------- Problem List Details Patient Name: DAVITA, SUBLETT. Date of Service: 05/31/2017 10:00  AM Medical Record Number: [id number] Patient Account Number: [id number] Date of Birth/Sex: 14-Jun-1920 (83 y.o. Female) Treating RN: Montey Hora Primary Care Provider: Einar Pheasant Other Clinician: Referring Provider: Einar Pheasant Treating Provider/Extender: Melburn Hake, HOYT Weeks in Treatment: 1 Active Problems ICD-10 Encounter Code Description Active Date Diagnosis L97.211 Non-pressure chronic ulcer of right calf limited to breakdown of skin 05/24/2017 Yes I87.321 Chronic venous hypertension (idiopathic) with inflammation of right 05/24/2017 Yes lower extremity Inactive Problems Resolved Problems Electronic Signature(s) Signed: 05/31/2017 12:07:21 PM By: Worthy Keeler PA-C Entered By: Worthy Keeler on 05/31/2017 10:33:51 Christy Cisneros ([id number]) -------------------------------------------------------------------------------- Progress Note/History and Physical Details Patient Name: Christy Cisneros. Date of Service: 05/31/2017 10:00 AM Medical Record Number: [id number] Patient Account Number: [id number] Date of Birth/Sex: Aug 13, 1919 (83 y.o. Female) Treating RN: Montey Hora Primary Care Provider: Einar Pheasant Other Clinician: Referring Provider: Einar Pheasant Treating Provider/Extender: Melburn Hake, HOYT Weeks in Treatment: 1 Subjective Chief Complaint Information obtained from Patient Right calf traumatic ulcer since early Dec 2015. 12/20/16; Patient here for a review of a non healing traumatic wound on the right lateral leg History of Present Illness (HPI) Very pleasant 83 year old with no significant past medical history. No diabetes or peripheral vascular disease. She traumatized her right anterior calf on a car door on 07/08/2014. She is slowly improving with regular debridements, compression, and. No significant pain. No claudication or rest pain. Right ABI 1.02. She walks a mile a day. No drainage. No fever or chills. She returns to clinic  today and says that the ulceration has healed. 12/20/16; This is a 83 year old women who traumatized her right leg against a table while adjusting a shade in her home. She suffered a skin tear. She was seen 2 weeks ago in her primary doctor's office and had a non stick dressing placed with kerlix and coban and that has apparently remained in place since then. She is complaining of pain but no systemic symptoms. I note she has been in this clinic 3 years ago for a similar presentation on the right leg.She was not seen by any of the current providers in our practice. She has compression stockings at home but does not use them. She has venous insufficiency. ABI's in this  clinic 1.09 12/27/16; arrives today with the wound in better condition. Dimension smaller. We've been using Prisma 01/04/17; patient's wound on the right anterior calf looks a lot better this week dimension smaller base of the wound looks healthy. She has been using Prisma under 2 layer compression. She has chronic bilateral venous insufficiency and tells me she has stockings at home that she has not been wearing 01/11/17; continued improvement in the right anterior lateral wound which was initially trauma in the setting of chronic venous insufficiency. We have been using Prisma under 2 layer compression 01/18/17; continued improvement in the right anterior leg wound which was initially traumatic in the setting of chronic venous insufficiency. We have been using Prisma under 2 layer compression. Nice improvement in terms of wound dimensions 01/25/17; right anterior leg wound which was initially trauma in the setting of chronic venous insufficiency. She does not have much in the way of edema however skin damage secondary to chronic hemosiderin deposition is quite apparent right greater than left. Her wound today is totally healed over. She brought in a support stocking. READMISSION 05/24/17; this is a 83 year old woman who traumatized her  right leg sometime in the last 2-3 weeks. She has very frail skin secondary to chronic venous insufficiency with marked bilateral hemosiderin deposition. She was seen in her primary physician's office on 05/12/17 and treated with doxycycline and Bactroban. We suggested compression stockings or at least support stockings last time although she is not compliant with these. Her ABI in this clinic was 1.09 05/31/17 on evaluation today patient appears to be doing fairly well in regard to her right lower extremity wound. Unfortunately the collagen is taking significantly to the wound bed likely due to the fact that this was wraps once and then left ear in the week before reevaluation here in our office. Obviously the wound is not draining very much. With that being said she is having no significant discomfort at this time which is good news. No fevers, chills, nausea, or vomiting noted at this time. SHINEKA, AUBLE. ([id number]) Wound History Patient presents with 1 open wound that has been present for approximately 1 month. Patient has been treating wound in the following manner: dry dressing. Laboratory tests have not been performed in the last month. Patient reportedly has not tested positive for an antibiotic resistant organism. Patient reportedly has not tested positive for osteomyelitis. Patient History Information obtained from Patient. Family History Cancer, Heart Disease - Mother, Hypertension - Mother, No family history of Diabetes, Hereditary Spherocytosis, Kidney Disease, Lung Disease, Seizures, Stroke, Thyroid Problems, Tuberculosis. Social History Never smoker, Marital Status - Single, Alcohol Use - Daily, Drug Use - No History, Caffeine Use - Daily. Medical History Eyes Patient has history of Glaucoma Denies history of Cataracts, Optic Neuritis Ear/Nose/Mouth/Throat Denies history of Chronic sinus problems/congestion, Middle ear problems Hematologic/Lymphatic Denies history  of Anemia, Hemophilia, Human Immunodeficiency Virus, Lymphedema, Sickle Cell Disease Respiratory Patient has history of Sleep Apnea - not using CPAP Denies history of Aspiration, Asthma, Chronic Obstructive Pulmonary Disease (COPD), Pneumothorax, Tuberculosis Cardiovascular Denies history of Angina, Arrhythmia, Congestive Heart Failure, Coronary Artery Disease, Deep Vein Thrombosis, Hypertension, Hypotension, Myocardial Infarction, Peripheral Arterial Disease, Peripheral Venous Disease, Phlebitis, Vasculitis Gastrointestinal Denies history of Cirrhosis , Colitis, Crohn s, Hepatitis A, Hepatitis B, Hepatitis C Endocrine Denies history of Type I Diabetes, Type II Diabetes Genitourinary Denies history of End Stage Renal Disease Immunological Denies history of Lupus Erythematosus, Raynaud s, Scleroderma Integumentary (Skin) Denies history of History of Burn,  History of pressure wounds Musculoskeletal Patient has history of Osteoarthritis Denies history of Gout, Rheumatoid Arthritis, Osteomyelitis Neurologic Denies history of Dementia, Neuropathy, Quadriplegia, Paraplegia, Seizure Disorder Oncologic Denies history of Received Chemotherapy, Received Radiation Psychiatric Denies history of Anorexia/bulimia, Confinement Anxiety Medical And Surgical History Notes Ear/Nose/Mouth/Throat bilateral hearing aids Gastrointestinal GERD Review of Systems (ROS) Constitutional Symptoms (General Health) Denies complaints or symptoms of Fever, Chills. SHAMARIE, CALL. ([id number]) Respiratory The patient has no complaints or symptoms. Cardiovascular Complains or has symptoms of LE edema. Psychiatric The patient has no complaints or symptoms. Objective Constitutional Well-nourished and well-hydrated in no acute distress. Vitals Time Taken: 10:09 AM, Height: 61 in, Weight: 120 lbs, BMI: 22.7, Temperature: 97.9 F, Pulse: 76 bpm, Respiratory Rate: 18 breaths/min, Blood Pressure: 121/54  mmHg. Respiratory normal breathing without difficulty. clear to auscultation bilaterally. Cardiovascular regular rate and rhythm with normal S1, S2. Psychiatric this patient is able to make decisions and demonstrates good insight into disease process. Alert and Oriented x 3. pleasant and cooperative. General Notes: Patient's wound appears to be fairly good after cleansing with saline and calls today. There's no need for debridement and she had minimal discomfort during the cleansing. The wrap does seem to be controlling her swelling very well. Integumentary (Hair, Skin) Wound #3 status is Open. Original cause of wound was Trauma. The wound is located on the Right,Anterior Lower Leg. The wound measures 1.2cm length x 1.6cm width x 0.1cm depth; 1.508cm^2 area and 0.151cm^3 volume. There is no tunneling or undermining noted. There is a large amount of serous drainage noted. The wound margin is flat and intact. There is large (67-100%) red granulation within the wound bed. There is a small (1-33%) amount of necrotic tissue within the wound bed including Adherent Slough. The periwound skin appearance did not exhibit: Callus, Crepitus, Excoriation, Induration, Rash, Scarring, Dry/Scaly, Maceration, Atrophie Blanche, Cyanosis, Ecchymosis, Hemosiderin Staining, Mottled, Pallor, Rubor, Erythema. Periwound temperature was noted as No Abnormality. The periwound has tenderness on palpation. Assessment Active Problems ICD-10 L97.211 - Non-pressure chronic ulcer of right calf limited to breakdown of skin I87.321 - Chronic venous hypertension (idiopathic) with inflammation of right lower extremity ELLINOR, TEST. ([id number]) Plan Wound Cleansing: Wound #3 Right,Anterior Lower Leg: Clean wound with Normal Saline. May shower with protection. Anesthetic: Wound #3 Right,Anterior Lower Leg: Topical Lidocaine 4% cream applied to wound bed prior to debridement Primary Wound Dressing: Wound #3  Right,Anterior Lower Leg: Silvercel Non-Adherent Secondary Dressing: Wound #3 Right,Anterior Lower Leg: ABD pad Dressing Change Frequency: Wound #3 Right,Anterior Lower Leg: Change Dressing Monday, Wednesday, Friday - changed at [facility] on Wednesdays Follow-up Appointments: Wound #3 Right,Anterior Lower Leg: Return Appointment in 1 week. Edema Control: Wound #3 Right,Anterior Lower Leg: Kerlix and Coban - Right Lower Extremity - wrap lightly from toes to below the knee Additional Orders / Instructions: Wound #3 Right,Anterior Lower Leg: Increase protein intake. Home Health: Wound #3 Right,Anterior Lower Leg: [place] for [place] Nurse may visit PRN to address patient s wound care needs. FACE TO FACE ENCOUNTER: MEDICARE and MEDICAID PATIENTS: I certify that this patient is under my care and that I had a face-to-face encounter that meets the physician face-to-face encounter requirements with this patient on this date. The encounter with the patient was in whole or in part for the following MEDICAL CONDITION: (primary reason for [place]) MEDICAL NECESSITY: I certify, that based on my findings, NURSING services are a medically necessary home health service. HOME BOUND  STATUS: I certify that my clinical findings support that this patient is homebound (i.e., Due to illness or injury, pt requires aid of supportive devices such as crutches, cane, wheelchairs, walkers, the use of special transportation or the assistance of another person to leave their place of residence. There is a normal inability to leave the home and doing so requires considerable and taxing effort. Other absences are for medical reasons / religious services and are infrequent or of short duration when for other reasons). If current dressing causes regression in wound condition, may D/C ordered dressing product/s and apply Normal Saline Moist Dressing daily until  next [place] / Other MD appointment. [place] of regression in wound condition at [phone]. Please direct any NON-WOUND related issues/requests for orders to patient's Primary Care Physician I am going to recommend that we switch to a silver alginate dressing for the next week. We will also wrap her leg three times a week going forward. She will have the wrap changed here on Wednesdays and then subsequently Friday and Monday by BRAYLI, KLINGBEIL. ([id number]) home health. We will see were things stand following in one week. If anything worsens significantly she will contact our office for additional recommendations. Please see above for specific wound care orders. Electronic Signature(s) Signed: 05/31/2017 12:07:21 PM By: Worthy Keeler PA-C Entered By: Worthy Keeler on 05/31/2017 10:57:48 Christy Cisneros ([id number]) -------------------------------------------------------------------------------- ROS/PFSH Details Patient Name: CARMELLE, BAMBERG. Date of Service: 05/31/2017 10:00 AM Medical Record Number: [id number] Patient Account Number: [id number] Date of Birth/Sex: 1919/08/05 (83 y.o. Female) Treating RN: Montey Hora Primary Care Provider: Einar Pheasant Other Clinician: Referring Provider: Einar Pheasant Treating Provider/Extender: Melburn Hake, HOYT Weeks in Treatment: 1 Label Progress Note Print Version as History and Physical for this encounter Information Obtained From Patient Wound History Do you currently have one or more open woundso Yes How many open wounds do you currently haveo 1 Approximately how long have you had your woundso 1 month How have you been treating your wound(s) until nowo dry dressing Has your wound(s) ever healed and then re-openedo No Have you had any lab work done in the past montho No Have you tested positive for an antibiotic resistant organism (MRSA, VRE)o No Have you tested positive for  osteomyelitis (bone infection)o No Constitutional Symptoms (General Health) Complaints and Symptoms: Negative for: Fever; Chills Cardiovascular Complaints and Symptoms: Positive for: LE edema Medical History: Negative for: Angina; Arrhythmia; Congestive Heart Failure; Coronary Artery Disease; Deep Vein Thrombosis; Hypertension; Hypotension; Myocardial Infarction; Peripheral Arterial Disease; Peripheral Venous Disease; Phlebitis; Vasculitis Eyes Medical History: Positive for: Glaucoma Negative for: Cataracts; Optic Neuritis Ear/Nose/Mouth/Throat Medical History: Negative for: Chronic sinus problems/congestion; Middle ear problems Past Medical History Notes: bilateral hearing aids Hematologic/Lymphatic Medical History: Negative for: Anemia; Hemophilia; Human Immunodeficiency Virus; Lymphedema; Sickle Cell Disease Respiratory Complaints and Symptoms: No Complaints or Symptoms SAPHRONIA, OZDEMIR. ([id number]) Medical History: Positive for: Sleep Apnea - not using CPAP Negative for: Aspiration; Asthma; Chronic Obstructive Pulmonary Disease (COPD); Pneumothorax; Tuberculosis Gastrointestinal Medical History: Negative for: Cirrhosis ; Colitis; Crohnos; Hepatitis A; Hepatitis B; Hepatitis C Past Medical History Notes: GERD Endocrine Medical History: Negative for: Type I Diabetes; Type II Diabetes Genitourinary Medical History: Negative for: End Stage Renal Disease Immunological Medical History: Negative for: Lupus Erythematosus; Raynaudos; Scleroderma Integumentary (Skin) Medical History: Negative for: History of Burn; History of pressure wounds Musculoskeletal Medical History: Positive for: Osteoarthritis Negative for: Gout; Rheumatoid Arthritis; Osteomyelitis Neurologic Medical History: Negative for: Dementia; Neuropathy;  Quadriplegia; Paraplegia; Seizure Disorder Oncologic Medical History: Negative for: Received Chemotherapy; Received  Radiation Psychiatric Complaints and Symptoms: No Complaints or Symptoms Medical History: Negative for: Anorexia/bulimia; Confinement Anxiety HBO Extended History Items Eyes: Glaucoma ETHELYNE, ERICH ([id number]) Immunizations Pneumococcal Vaccine: Received Pneumococcal Vaccination: Yes Implantable Devices Family and Social History Cancer: Yes; Diabetes: No; Heart Disease: Yes - Mother; Hereditary Spherocytosis: No; Hypertension: Yes - Mother; Kidney Disease: No; Lung Disease: No; Seizures: No; Stroke: No; Thyroid Problems: No; Tuberculosis: No; Never smoker; Marital Status - Single; Alcohol Use: Daily; Drug Use: No History; Caffeine Use: Daily; Financial Concerns: No; Food, Clothing or Shelter Needs: No; Support System Lacking: No; Transportation Concerns: No; Advanced Directives: Yes (Not Provided); Patient does not want information on Advanced Directives; Living Will: Yes (Copy provided); Medical Power of Attorney: Yes - Freddrick March (Copy provided) Physician Affirmation I have reviewed and agree with the above information. Electronic Signature(s) Signed: 05/31/2017 12:07:21 PM By: Worthy Keeler PA-C Signed: 05/31/2017 4:29:50 PM By: Montey Hora Entered By: Worthy Keeler on 05/31/2017 10:56:27 Christy Cisneros ([id number]) -------------------------------------------------------------------------------- SuperBill Details Patient Name: BREEAN, NANNINI. Date of Service: 05/31/2017 Medical Record Number: [id number] Patient Account Number: [id number] Date of Birth/Sex: 10/29/1919 (83 y.o. 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 I87.321 Chronic venous hypertension (idiopathic) with  inflammation of right lower extremity Facility Procedures CPT4 Code: [id number] Description: 99213 - WOUND CARE VISIT-LEV 3 EST PT Modifier: Quantity: 1 Physician Procedures CPT4 Code Description: 2947654 65035 - WC PHYS LEVEL 3 - EST PT ICD-10 Diagnosis Description L97.211 Non-pressure chronic ulcer of right calf limited to breakdown I87.321 Chronic venous hypertension (idiopathic) with inflammation of Modifier: of skin right lower extr Quantity: 1 emity Electronic Signature(s) Signed: 05/31/2017 3:38:59 PM By: Montey Hora Signed: 06/01/2017 3:07:11 PM By: Worthy Keeler PA-C Previous Signature: 05/31/2017 12:07:21 PM Version By: Worthy Keeler PA-C Entered By: Montey Hora on 05/31/2017 15:38:58

## 2017-06-05 DIAGNOSIS — G4730 Sleep apnea, unspecified: Secondary | ICD-10-CM | POA: Diagnosis not present

## 2017-06-05 DIAGNOSIS — S81811D Laceration without foreign body, right lower leg, subsequent encounter: Secondary | ICD-10-CM | POA: Diagnosis not present

## 2017-06-05 DIAGNOSIS — H409 Unspecified glaucoma: Secondary | ICD-10-CM | POA: Diagnosis not present

## 2017-06-05 DIAGNOSIS — M1991 Primary osteoarthritis, unspecified site: Secondary | ICD-10-CM | POA: Diagnosis not present

## 2017-06-05 DIAGNOSIS — I87321 Chronic venous hypertension (idiopathic) with inflammation of right lower extremity: Secondary | ICD-10-CM | POA: Diagnosis not present

## 2017-06-07 ENCOUNTER — Encounter: Payer: Medicare Other | Attending: Internal Medicine | Admitting: Internal Medicine

## 2017-06-07 DIAGNOSIS — I87321 Chronic venous hypertension (idiopathic) with inflammation of right lower extremity: Secondary | ICD-10-CM | POA: Diagnosis not present

## 2017-06-07 DIAGNOSIS — L97211 Non-pressure chronic ulcer of right calf limited to breakdown of skin: Secondary | ICD-10-CM | POA: Insufficient documentation

## 2017-06-07 DIAGNOSIS — L97812 Non-pressure chronic ulcer of other part of right lower leg with fat layer exposed: Secondary | ICD-10-CM | POA: Diagnosis not present

## 2017-06-09 DIAGNOSIS — G4730 Sleep apnea, unspecified: Secondary | ICD-10-CM | POA: Diagnosis not present

## 2017-06-09 DIAGNOSIS — H409 Unspecified glaucoma: Secondary | ICD-10-CM | POA: Diagnosis not present

## 2017-06-09 DIAGNOSIS — S81811D Laceration without foreign body, right lower leg, subsequent encounter: Secondary | ICD-10-CM | POA: Diagnosis not present

## 2017-06-09 DIAGNOSIS — M1991 Primary osteoarthritis, unspecified site: Secondary | ICD-10-CM | POA: Diagnosis not present

## 2017-06-09 DIAGNOSIS — I87321 Chronic venous hypertension (idiopathic) with inflammation of right lower extremity: Secondary | ICD-10-CM | POA: Diagnosis not present

## 2017-06-09 NOTE — Progress Notes (Signed)
Christy Cisneros, Christy Cisneros ([id number]) Visit Report for 06/07/2017 Arrival Information Details Patient Name: Christy Cisneros, Christy Cisneros. Date of Service: 06/07/2017 1:30 PM Medical Record Number: [id number] Patient Account Number: [id number] Date of Birth/Sex: 1919/10/01 (83 y.o. Female) Treating RN: Christy Cisneros Primary Care Christy Cisneros: Christy Cisneros Other Clinician: Referring Christy Cisneros: Christy Cisneros Treating Johnwilliam Shepperson/Extender: Christy Cisneros in Treatment: 2 Visit Information History Since Last Visit Added or deleted any medications: No Patient Arrived: Ambulatory Any new allergies or adverse reactions: No Arrival Time: 13:44 Had a fall or experienced change in No Accompanied By: friend activities of daily living that may affect Transfer Assistance: None risk of falls: Patient Identification Verified: Yes Signs or symptoms of abuse/neglect since last visito No Secondary Verification Process Completed: Yes Hospitalized since last visit: No Patient Has Alerts: Yes Has Dressing in Place as Prescribed: Yes Patient Alerts: aspirin 81, plavix Has Compression in Place as Prescribed: Yes ABI R 1.09 Pain Present Now: No Electronic Signature(s) Signed: 06/07/2017 5:08:04 PM By: Christy Cisneros Entered By: Christy Cisneros on 06/07/2017 13:45:13 Christy Cisneros ([id number]) -------------------------------------------------------------------------------- Clinic Level of Care Assessment Details Patient Name: Christy Cisneros. Date of Service: 06/07/2017 1:30 PM Medical Record Number: [id number] Patient Account Number: [id number] Date of Birth/Sex: [birth year] (83 y.o. Female) Treating RN: Christy Cisneros Primary Care Christy Cisneros: Christy Cisneros Other Clinician: Referring Christy Cisneros: Christy Cisneros Treating Hilmar Moldovan/Extender: Christy Cisneros in Treatment: 2 Clinic Level of Care Assessment Items TOOL 4 Quantity Score []  - Use when only an EandM is performed on FOLLOW-UP visit  0 ASSESSMENTS - Nursing Assessment / Reassessment X - Reassessment of Co-morbidities (includes updates in patient status) 1 10 X- 1 5 Reassessment of Adherence to Treatment Plan ASSESSMENTS - Wound and Skin Assessment / Reassessment X - Simple Wound Assessment / Reassessment - one wound 1 5 []  - 0 Complex Wound Assessment / Reassessment - multiple wounds []  - 0 Dermatologic / Skin Assessment (not related to wound area) ASSESSMENTS - Focused Assessment []  - Circumferential Edema Measurements - multi extremities 0 []  - 0 Nutritional Assessment / Counseling / Intervention X- 1 5 Lower Extremity Assessment (monofilament, tuning fork, pulses) []  - 0 Peripheral Arterial Disease Assessment (using hand held doppler) ASSESSMENTS - Ostomy and/or Continence Assessment and Care []  - Incontinence Assessment and Management 0 []  - 0 Ostomy Care Assessment and Management (repouching, etc.) PROCESS - Coordination of Care X - Simple Patient / Family Education for ongoing care 1 15 []  - 0 Complex (extensive) Patient / Family Education for ongoing care []  - 0 Staff obtains Programmer, systems, Records, Test Results / Process Orders []  - 0 Staff telephones HHA, Nursing Homes / Clarify orders / etc []  - 0 Routine Transfer to another Facility (non-emergent condition) []  - 0 Routine Hospital Admission (non-emergent condition) []  - 0 New Admissions / Biomedical engineer / Ordering NPWT, Apligraf, etc. []  - 0 Emergency Hospital Admission (emergent condition) X- 1 10 Simple Discharge Coordination Christy Cisneros, Christy Cisneros ([id number]) []  - 0 Complex (extensive) Discharge Coordination PROCESS - Special Needs []  - Pediatric / Minor Patient Management 0 []  - 0 Isolation Patient Management []  - 0 Hearing / Language / Visual special needs []  - 0 Assessment of Community assistance (transportation, D/C planning, etc.) []  - 0 Additional assistance / Altered mentation []  - 0 Support Surface(s) Assessment  (bed, cushion, seat, etc.) INTERVENTIONS - Wound Cleansing / Measurement X - Simple Wound Cleansing - one wound 1 5 []  - 0 Complex Wound Cleansing - multiple wounds X- 1 5 Wound Imaging (  photographs - any number of wounds) []  - 0 Wound Tracing (instead of photographs) X- 1 5 Simple Wound Measurement - one wound []  - 0 Complex Wound Measurement - multiple wounds INTERVENTIONS - Wound Dressings []  - Small Wound Dressing one or multiple wounds 0 []  - 0 Medium Wound Dressing one or multiple wounds X- 1 20 Large Wound Dressing one or multiple wounds []  - 0 Application of Medications - topical []  - 0 Application of Medications - injection INTERVENTIONS - Miscellaneous []  - External ear exam 0 []  - 0 Specimen Collection (cultures, biopsies, blood, body fluids, etc.) []  - 0 Specimen(s) / Culture(s) sent or taken to Lab for analysis []  - 0 Patient Transfer (multiple staff / Civil Service fast streamer / Similar devices) []  - 0 Simple Staple / Suture removal (25 or less) []  - 0 Complex Staple / Suture removal (26 or more) []  - 0 Hypo / Hyperglycemic Management (close monitor of Blood Glucose) []  - 0 Ankle / Brachial Index (ABI) - do not check if billed separately X- 1 5 Vital Signs Christy Cisneros, Christy Cisneros ([id number]) Has the patient been seen at the hospital within the last three years: Yes Total Score: 90 Level Of Care: New/Established - Level 3 Electronic Signature(s) Signed: 06/07/2017 5:08:04 PM By: Christy Cisneros Entered By: Christy Cisneros on 06/07/2017 16:31:22 Christy Cisneros ([id number]) -------------------------------------------------------------------------------- Encounter Discharge Information Details Patient Name: Christy Cisneros. Date of Service: 06/07/2017 1:30 PM Medical Record Number: [id number] Patient Account Number: [id number] Date of Birth/Sex: November 27, 1919 (83 y.o. Female) Treating RN: Christy Cisneros Primary Care Graylyn Bunney: Christy Cisneros Other  Clinician: Referring Wafaa Deemer: Christy Cisneros Treating Darrin Koman/Extender: Christy Cisneros in Treatment: 2 Encounter Discharge Information Items Discharge Pain Level: 0 Discharge Condition: Stable Ambulatory Status: Ambulatory Discharge Destination: Home Transportation: Private Auto Accompanied By: friend Schedule Follow-up Appointment: Yes Medication Reconciliation completed and No provided to Patient/Care Wyatt Thorstenson: Provided on Clinical Summary of Care: 06/07/2017 Form Type Recipient Paper Patient FO Electronic Signature(s) Signed: 06/08/2017 10:05:11 AM By: Ruthine Dose Entered By: Ruthine Dose on 06/07/2017 14:19:24 Christy Cisneros ([id number]) -------------------------------------------------------------------------------- Lower Extremity Assessment Details Patient Name: Christy Cisneros. Date of Service: 06/07/2017 1:30 PM Medical Record Number: [id number] Patient Account Number: [id number] Date of Birth/Sex: 08/23/1919 (83 y.o. Female) Treating RN: Christy Cisneros Primary Care Fedor Kazmierski: Christy Cisneros Other Clinician: Referring Ladina Shutters: Christy Cisneros Treating Karine Garn/Extender: Christy Cisneros in Treatment: 2 Vascular Assessment Pulses: Dorsalis Pedis Palpable: [Left:Yes] Posterior Tibial Extremity colors, hair growth, and conditions: Extremity Color: [Left:Hyperpigmented] Hair Growth on Extremity: [Left:Yes] Temperature of Extremity: [Left:Warm] Capillary Refill: [Left:< 3 seconds] Electronic Signature(s) Signed: 06/07/2017 5:08:04 PM By: Christy Cisneros Entered By: Christy Cisneros on 06/07/2017 13:58:18 Christy Cisneros ([id number]) -------------------------------------------------------------------------------- Multi Wound Chart Details Patient Name: Christy Cisneros. Date of Service: 06/07/2017 1:30 PM Medical Record Number: [id number] Patient Account Number: [id number] Date of Birth/Sex: Apr 05, 1920 (83 y.o. Female) Treating  RN: Christy Cisneros Primary Care Breindel Collier: Christy Cisneros Other Clinician: Referring Zanobia Griebel: Christy Cisneros Treating Maiana Hennigan/Extender: Christy Cisneros in Treatment: 2 Vital Signs Height(in): 61 Pulse(bpm): 74 Weight(lbs): 120 Blood Pressure(mmHg): 133/67 Body Mass Index(BMI): 23 Temperature(F): 97.8 Respiratory Rate 18 (breaths/min): Photos: [N/A:N/A] Wound Location: Right Lower Leg - Anterior N/A N/A Wounding Event: Trauma N/A N/A Primary Etiology: Trauma, Other N/A N/A Comorbid History: Glaucoma, Sleep Apnea, N/A N/A Osteoarthritis Date Acquired: 04/25/2017 N/A N/A Weeks of Treatment: 2 N/A N/A Wound Status: Open N/A N/A Measurements L x W x D 0.5x0.4x0.1 N/A N/A (cm) Area (cm) : 0.157 N/A  N/A Volume (cm) : 0.016 N/A N/A % Reduction in Area: 91.70% N/A N/A % Reduction in Volume: 91.50% N/A N/A Classification: Full Thickness Without N/A N/A Exposed Support Structures Exudate Amount: Large N/A N/A Exudate Type: Serous N/A N/A Exudate Color: amber N/A N/A Wound Margin: Flat and Intact N/A N/A Granulation Amount: Large (67-100%) N/A N/A Granulation Quality: Red N/A N/A Necrotic Amount: Small (1-33%) N/A N/A Exposed Structures: Fascia: No N/A N/A Fat Layer (Subcutaneous Tissue) Exposed: No Tendon: No Muscle: No Christy Cisneros, Christy Cisneros ([id number]) Joint: No Bone: No Epithelialization: Medium (34-66%) N/A N/A Periwound Skin Texture: Excoriation: No N/A N/A Induration: No Callus: No Crepitus: No Rash: No Scarring: No Periwound Skin Moisture: Maceration: No N/A N/A Dry/Scaly: No Periwound Skin Color: Atrophie Blanche: No N/A N/A Cyanosis: No Ecchymosis: No Erythema: No Hemosiderin Staining: No Mottled: No Pallor: No Rubor: No Temperature: No Abnormality N/A N/A Tenderness on Palpation: Yes N/A N/A Wound Preparation: Ulcer Cleansing: N/A N/A Rinsed/Irrigated with Saline Topical Anesthetic Applied: Other: lidocaine 4% Treatment  Notes Wound #3 (Right, Anterior Lower Leg) 1. Cleansed with: Clean wound with Normal Saline 2. Anesthetic Topical Lidocaine 4% cream to wound bed prior to debridement 4. Dressing Applied: Other dressing (specify in notes) 5. Secondary Dressing Applied ABD Pad 7. Secured with Tape Other (specify in notes) Notes silvercel, kerlix and coban wrap, unna to Engineer, production) Signed: 06/07/2017 5:39:07 PM By: Linton Ham MD Previous Signature: 06/07/2017 5:08:04 PM Version By: Christy Cisneros Entered By: Linton Ham on 06/07/2017 17:30:30 Christy Cisneros ([id number]) -------------------------------------------------------------------------------- [place] Details Patient Name: Christy Cisneros, Christy Cisneros. Date of Service: 06/07/2017 1:30 PM Medical Record Number: [id number] Patient Account Number: [id number] Date of Birth/Sex: Oct 22, 1919 (83 y.o. Female) Treating RN: Christy Cisneros Primary Care Scharlene Catalina: Christy Cisneros Other Clinician: Referring Andrika Peraza: Christy Cisneros Treating Justis Dupas/Extender: Christy Cisneros in Treatment: 2 Active Inactive ` Abuse / Safety / Falls / Self Care Management Nursing Diagnoses: Potential for falls Goals: Patient will not experience any injury related to falls Date Initiated: 05/24/2017 Target Resolution Date: 08/05/2017 Goal Status: Active Interventions: Assess fall risk on admission and as needed Notes: ` Orientation to the Wound Care Program Nursing Diagnoses: Knowledge deficit related to the wound healing center program Goals: Patient/caregiver will verbalize understanding of the [place] Date Initiated: 05/24/2017 Target Resolution Date: 08/05/2017 Goal Status: Active Interventions: Provide education on orientation to the wound center Notes: ` Wound/Skin Impairment Nursing Diagnoses: Impaired tissue integrity Goals: Ulcer/skin breakdown will heal within 14 weeks Date  Initiated: 05/24/2017 Target Resolution Date: 08/05/2017 Goal Status: Active Interventions: Christy Cisneros, Christy Cisneros ([id number]) Assess patient/caregiver ability to obtain necessary supplies Assess patient/caregiver ability to perform ulcer/skin care regimen upon admission and as needed Assess ulceration(s) every visit Notes: Electronic Signature(s) Signed: 06/07/2017 5:08:04 PM By: Christy Cisneros Entered By: Christy Cisneros on 06/07/2017 13:58:38 Christy Cisneros ([id number]) -------------------------------------------------------------------------------- Pain Assessment Details Patient Name: Christy Cisneros. Date of Service: 06/07/2017 1:30 PM Medical Record Number: [id number] Patient Account Number: [id number] Date of Birth/Sex: 01/07/1920 (83 y.o. Female) Treating RN: Christy Cisneros Primary Care Carlin Attridge: Christy Cisneros Other Clinician: Referring Bronsyn Shappell: Christy Cisneros Treating Amari Zagal/Extender: Christy Cisneros in Treatment: 2 Active Problems Location of Pain Severity and Description of Pain Patient Has Paino No Site Locations Pain Management and Medication Current Pain Management: Notes Topical or injectable lidocaine is offered to patient for acute pain when surgical debridement is performed. If needed, Patient is instructed to use over the counter pain medication for the following  24-48 hours after debridement. Wound care MDs do not prescribed pain medications. Patient has chronic pain or uncontrolled pain. Patient has been instructed to make an appointment with their Primary Care Physician for pain management. Electronic Signature(s) Signed: 06/07/2017 5:08:04 PM By: Christy Cisneros Entered By: Christy Cisneros on 06/07/2017 13:45:24 Christy Cisneros ([id number]) -------------------------------------------------------------------------------- Patient/Caregiver Education Details Patient Name: Christy Cisneros, Christy Cisneros. Date of Service: 06/07/2017 1:30  PM Medical Record Number: [id number] Patient Account Number: [id number] Date of Birth/Gender: September 26, 1919 (83 y.o. Female) Treating RN: Christy Cisneros Primary Care Physician: Christy Cisneros Other Clinician: Referring Physician: Einar Cisneros Treating Physician/Extender: Christy Cisneros in Treatment: 2 Education Assessment Education Provided To: Patient Education Topics Provided Venous: Handouts: Other: wear support/compression hose daily Methods: Explain/Verbal, Printed Responses: State content correctly Electronic Signature(s) Signed: 06/07/2017 5:08:04 PM By: Christy Cisneros Entered By: Christy Cisneros on 06/07/2017 14:05:20 Christy Cisneros ([id number]) -------------------------------------------------------------------------------- Wound Assessment Details Patient Name: Christy Cisneros. Date of Service: 06/07/2017 1:30 PM Medical Record Number: [id number] Patient Account Number: [id number] Date of Birth/Sex: 1919-10-08 (83 y.o. Female) Treating RN: Christy Cisneros Primary Care Tobey Lippard: Christy Cisneros Other Clinician: Referring Xayne Brumbaugh: Christy Cisneros Treating Korene Dula/Extender: Christy Cisneros in Treatment: 2 Wound Status Wound Number: 3 Primary Etiology: Trauma, Other Wound Location: Right Lower Leg - Anterior Wound Status: Open Wounding Event: Trauma Comorbid History: Glaucoma, Sleep Apnea, Osteoarthritis Date Acquired: 04/25/2017 Weeks Of Treatment: 2 Clustered Wound: No Photos Photo Uploaded By: Christy Cisneros on 06/07/2017 17:10:05 Wound Measurements Length: (cm) 0.5 Width: (cm) 0.4 Depth: (cm) 0.1 Area: (cm) 0.157 Volume: (cm) 0.016 % Reduction in Area: 91.7% % Reduction in Volume: 91.5% Epithelialization: Medium (34-66%) Tunneling: No Undermining: No Wound Description Full Thickness Without Exposed Support Classification: Structures Wound Margin: Flat and Intact Exudate Large Amount: Exudate Type: Serous Exudate  Color: amber Foul Odor After Cleansing: No Slough/Fibrino Yes Wound Bed Granulation Amount: Large (67-100%) Exposed Structure Granulation Quality: Red Fascia Exposed: No Necrotic Amount: Small (1-33%) Fat Layer (Subcutaneous Tissue) Exposed: No Necrotic Quality: Adherent Slough Tendon Exposed: No Muscle Exposed: No Joint Exposed: No Bone Exposed: No Christy Cisneros, Christy Cisneros. ([id number]) Periwound Skin Texture Texture Color No Abnormalities Noted: No No Abnormalities Noted: No Callus: No Atrophie Blanche: No Crepitus: No Cyanosis: No Excoriation: No Ecchymosis: No Induration: No Erythema: No Rash: No Hemosiderin Staining: No Scarring: No Mottled: No Pallor: No Moisture Rubor: No No Abnormalities Noted: No Dry / Scaly: No Temperature / Pain Maceration: No Temperature: No Abnormality Tenderness on Palpation: Yes Wound Preparation Ulcer Cleansing: Rinsed/Irrigated with Saline Topical Anesthetic Applied: Other: lidocaine 4%, Treatment Notes Wound #3 (Right, Anterior Lower Leg) 1. Cleansed with: Clean wound with Normal Saline 2. Anesthetic Topical Lidocaine 4% cream to wound bed prior to debridement 4. Dressing Applied: Other dressing (specify in notes) 5. Secondary Dressing Applied ABD Pad 7. Secured with Tape Other (specify in notes) Notes silvercel, kerlix and coban wrap, unna to anchor Electronic Signature(s) Signed: 06/07/2017 5:08:04 PM By: Christy Cisneros Entered By: Christy Cisneros on 06/07/2017 13:58:02 Christy Cisneros ([id number]) -------------------------------------------------------------------------------- [place] Details Patient Name: Christy Cisneros. Date of Service: 06/07/2017 1:30 PM Medical Record Number: [id number] Patient Account Number: [id number] Date of Birth/Sex: 1919/12/18 (83 y.o. Female) Treating RN: Christy Cisneros Primary Care Kelten Enochs: Christy Cisneros Other Clinician: Referring Sally Reimers: Christy Cisneros Treating  Danny Yackley/Extender: Christy Cisneros in Treatment: 2 Vital Signs Time Taken: 13:48 Temperature (F): 97.8 Height (in): 61 Pulse (bpm): 74 Weight (lbs): 120 Respiratory Rate (breaths/min): 18 Body Mass Index (BMI): 22.7 Blood  Pressure (mmHg): 133/67 Reference Range: 80 - 120 mg / dl Electronic Signature(s) Signed: 06/07/2017 5:08:04 PM By: Christy Cisneros Entered By: Christy Cisneros on 06/07/2017 13:48:40

## 2017-06-09 NOTE — Progress Notes (Signed)
Christy, Cisneros ([id number]) Visit Report for 06/07/2017 HPI Details Patient Name: Christy Cisneros, Christy Cisneros. Date of Service: 06/07/2017 1:30 PM Medical Record Number: [id number] Patient Account Number: [id number] Date of Birth/Sex: 08-24-1919 (83 y.o. Female) Treating RN: Montey Hora Primary Care Provider: Einar Pheasant Other Clinician: Referring Provider: Einar Pheasant Treating Provider/Extender: Tito Dine in Treatment: 2 History of Present Illness HPI Description: Very pleasant 83 year old with no significant past medical history. No diabetes or peripheral vascular disease. She traumatized her right anterior calf on a car door on 07/08/2014. She is slowly improving with regular debridements, compression, and. No significant pain. No claudication or rest pain. Right ABI 1.02. She walks a mile a day. No drainage. No fever or chills. She returns to clinic today and says that the ulceration has healed. 12/20/16; This is a 83 year old women who traumatized her right leg against a table while adjusting a shade in her home. She suffered a skin tear. She was seen 2 weeks ago in her primary doctor's office and had a non stick dressing placed with kerlix and coban and that has apparently remained in place since then. She is complaining of pain but no systemic symptoms. I note she has been in this clinic 3 years ago for a similar presentation on the right leg.She was not seen by any of the current providers in our practice. She has compression stockings at home but does not use them. She has venous insufficiency. ABI's in this clinic 1.09 12/27/16; arrives today with the wound in better condition. Dimension smaller. We've been using Prisma 01/04/17; patient's wound on the right anterior calf looks a lot better this week dimension smaller base of the wound looks healthy. She has been using Prisma under 2 layer compression. She has chronic bilateral venous insufficiency and tells  me she has stockings at home that she has not been wearing 01/11/17; continued improvement in the right anterior lateral wound which was initially trauma in the setting of chronic venous insufficiency. We have been using Prisma under 2 layer compression 01/18/17; continued improvement in the right anterior leg wound which was initially traumatic in the setting of chronic venous insufficiency. We have been using Prisma under 2 layer compression. Nice improvement in terms of wound dimensions 01/25/17; right anterior leg wound which was initially trauma in the setting of chronic venous insufficiency. She does not have much in the way of edema however skin damage secondary to chronic hemosiderin deposition is quite apparent right greater than left. Her wound today is totally healed over. She brought in a support stocking. READMISSION 05/24/17; this is a 83 year old woman who traumatized her right leg sometime in the last 2-3 weeks. She has very frail skin secondary to chronic venous insufficiency with marked bilateral hemosiderin deposition. She was seen in her primary physician's office on 05/12/17 and treated with doxycycline and Bactroban. We suggested compression stockings or at least support stockings last time although she is not compliant with these. Her ABI in this clinic was 1.09 05/31/17 on evaluation today patient appears to be doing fairly well in regard to her right lower extremity wound. Unfortunately the collagen is taking significantly to the wound bed likely due to the fact that this was wraps once and then left ear in the week before reevaluation here in our office. Obviously the wound is not draining very much. With that being said she is having no significant discomfort at this time which is good news. No fevers, chills, nausea, or vomiting noted  at this time. 06/07/17; the patient continues to do nicely in terms of her traumatic right leg wound in the setting of chronic  venous insufficiency and very frail skin with bilateral severe hemosiderin deposition. There is no evidence of infection Electronic Signature(s) TERRE, HANNEMAN ([id number]) Signed: 06/07/2017 5:39:07 PM By: Linton Ham MD Entered By: Linton Ham on 06/07/2017 17:31:05 [name] ([id number]) -------------------------------------------------------------------------------- Physical Exam Details Patient Name: Christy, Cisneros. Date of Service: 06/07/2017 1:30 PM Medical Record Number: [id number] Patient Account Number: [id number] Date of Birth/Sex: [birth year] (83 y.o. Female) Treating RN: Montey Hora Primary Care Provider: Einar Pheasant Other Clinician: Referring Provider: Einar Pheasant Treating Provider/Extender: Ricard Dillon Weeks in Treatment: 2 Respiratory Respiratory effort is easy and symmetric bilaterally. Rate is normal at rest and on room air.. Cardiovascular Pedal pulses palpable and strong bilaterally.. there is no edema in her bilateral lower legs. Lymphatic none palpable in. Integumentary (Hair, Skin) other than severe hemosiderin deposition no rash. Psychiatric in case there is some general questioning she seems to be fairly cognitively intact around recent and remote or current events. Notes wound exam; the patient appears to make making fairly good progress. No need for debridement. This is close to closing. She has good edema control. We had some discussion with her about stockings down the road although the patient has been noncompliant with this in the past Electronic Signature(s) Signed: 06/07/2017 5:39:07 PM By: Linton Ham MD Entered By: Linton Ham on 06/07/2017 17:32:42 [name] ([id number]) -------------------------------------------------------------------------------- Physician Orders Details Patient Name: Cisneros, Christy. Date of Service: 06/07/2017 1:30 PM Medical Record Number:  [id number] Patient Account Number: [id number] Date of Birth/Sex: 07/28/1920 (83 y.o. Female) Treating RN: Montey Hora Primary Care Provider: Einar Pheasant Other Clinician: Referring Provider: Einar Pheasant Treating Provider/Extender: Tito Dine in Treatment: 2 Verbal / Phone Orders: No Diagnosis Coding Wound Cleansing Wound #3 Right,Anterior Lower Leg o Clean wound with Normal Saline. o May shower with protection. Anesthetic Wound #3 Right,Anterior Lower Leg o Topical Lidocaine 4% cream applied to wound bed prior to debridement Primary Wound Dressing Wound #3 Right,Anterior Lower Leg o Silvercel Non-Adherent Secondary Dressing Wound #3 Right,Anterior Lower Leg o ABD pad Dressing Change Frequency Wound #3 Right,Anterior Lower Leg o Change Dressing Monday, Wednesday, Friday - changed at [facility] on Wednesdays Follow-up Appointments Wound #3 Right,Anterior Lower Leg o Return Appointment in 1 week. Edema Control Wound #3 Right,Anterior Lower Leg o Kerlix and Coban - Right Lower Extremity - wrap lightly from toes to below the knee Additional Orders / Instructions Wound #3 Right,Anterior Lower Leg o Increase protein intake. Home Health Wound #3 [place] for [place] Nurse may visit PRN to address patientos wound care needs. o FACE TO FACE ENCOUNTER: MEDICARE and MEDICAID PATIENTS: I certify that this patient is under my care and that I had a face-to-face encounter that meets the physician face-to-face encounter requirements with this patient on this date. The encounter with the patient was in whole or in part for the following MEDICAL CONDITION: (primary reason for [place]) MEDICAL NECESSITY: I certify, that based on my findings, HEBAH, BOGOSIAN ([id number]) NURSING services are a medically necessary home health service. HOME BOUND STATUS: I certify  that my clinical findings support that this patient is homebound (i.e., Due to illness or injury, pt requires aid of supportive devices such as crutches, cane, wheelchairs, walkers, the use of special transportation or the assistance  of another person to leave their place of residence. There is a normal inability to leave the home and doing so requires considerable and taxing effort. Other absences are for medical reasons / religious services and are infrequent or of short duration when for other reasons). o If current dressing causes regression in wound condition, may D/C ordered dressing product/s and apply Normal Saline Moist Dressing daily until next [place] / Other MD appointment. [place] of regression in wound condition at [phone]. o Please direct any NON-WOUND related issues/requests for orders to patient's Primary Care Physician Electronic Signature(s) Signed: 06/07/2017 5:08:04 PM By: Montey Hora Signed: 06/07/2017 5:39:07 PM By: Linton Ham MD Entered By: Montey Hora on 06/07/2017 14:09:05 [name] ([id number]) -------------------------------------------------------------------------------- Problem List Details Patient Name: JULIANY, DAUGHETY. Date of Service: 06/07/2017 1:30 PM Medical Record Number: [id number] Patient Account Number: [id number] Date of Birth/Sex: 1920/03/31 (83 y.o. Female) Treating RN: Montey Hora Primary Care Provider: Einar Pheasant Other Clinician: Referring Provider: Einar Pheasant Treating Provider/Extender: Tito Dine in Treatment: 2 Active Problems ICD-10 Encounter Code Description Active Date Diagnosis L97.211 Non-pressure chronic ulcer of right calf limited to breakdown of skin 05/24/2017 Yes I87.321 Chronic venous hypertension (idiopathic) with inflammation of right 05/24/2017 Yes lower extremity Inactive Problems Resolved Problems Electronic  Signature(s) Signed: 06/07/2017 5:39:07 PM By: Linton Ham MD Entered By: Linton Ham on 06/07/2017 17:30:24 [name] ([id number]) -------------------------------------------------------------------------------- Progress Note Details Patient Name: [name]. Date of Service: 06/07/2017 1:30 PM Medical Record Number: [id number] Patient Account Number: [id number] Date of Birth/Sex: May 05, 1920 (83 y.o. Female) Treating RN: Montey Hora Primary Care Provider: Einar Pheasant Other Clinician: Referring Provider: Einar Pheasant Treating Provider/Extender: Tito Dine in Treatment: 2 Subjective History of Present Illness (HPI) Very pleasant 83 year old with no significant past medical history. No diabetes or peripheral vascular disease. She traumatized her right anterior calf on a car door on 07/08/2014. She is slowly improving with regular debridements, compression, and. No significant pain. No claudication or rest pain. Right ABI 1.02. She walks a mile a day. No drainage. No fever or chills. She returns to clinic today and says that the ulceration has healed. 12/20/16; This is a 83 year old women who traumatized her right leg against a table while adjusting a shade in her home. She suffered a skin tear. She was seen 2 weeks ago in her primary doctor's office and had a non stick dressing placed with kerlix and coban and that has apparently remained in place since then. She is complaining of pain but no systemic symptoms. I note she has been in this clinic 3 years ago for a similar presentation on the right leg.She was not seen by any of the current providers in our practice. She has compression stockings at home but does not use them. She has venous insufficiency. ABI's in this clinic 1.09 12/27/16; arrives today with the wound in better condition. Dimension smaller. We've been using Prisma 01/04/17; patient's wound on the right anterior calf looks a lot  better this week dimension smaller base of the wound looks healthy. She has been using Prisma under 2 layer compression. She has chronic bilateral venous insufficiency and tells me she has stockings at home that she has not been wearing 01/11/17; continued improvement in the right anterior lateral wound which was initially trauma in the setting of chronic venous insufficiency. We have been using Prisma under 2 layer compression 01/18/17; continued improvement in the right anterior leg wound  which was initially traumatic in the setting of chronic venous insufficiency. We have been using Prisma under 2 layer compression. Nice improvement in terms of wound dimensions 01/25/17; right anterior leg wound which was initially trauma in the setting of chronic venous insufficiency. She does not have much in the way of edema however skin damage secondary to chronic hemosiderin deposition is quite apparent right greater than left. Her wound today is totally healed over. She brought in a support stocking. READMISSION 05/24/17; this is a 83 year old woman who traumatized her right leg sometime in the last 2-3 weeks. She has very frail skin secondary to chronic venous insufficiency with marked bilateral hemosiderin deposition. She was seen in her primary physician's office on 05/12/17 and treated with doxycycline and Bactroban. We suggested compression stockings or at least support stockings last time although she is not compliant with these. Her ABI in this clinic was 1.09 05/31/17 on evaluation today patient appears to be doing fairly well in regard to her right lower extremity wound. Unfortunately the collagen is taking significantly to the wound bed likely due to the fact that this was wraps once and then left ear in the week before reevaluation here in our office. Obviously the wound is not draining very much. With that being said she is having no significant discomfort at this time which is good news. No  fevers, chills, nausea, or vomiting noted at this time. 06/07/17; the patient continues to do nicely in terms of her traumatic right leg wound in the setting of chronic venous insufficiency and very frail skin with bilateral severe hemosiderin deposition. There is no evidence of infection ELLANORE, VANHOOK. ([id number]) Objective Constitutional Vitals Time Taken: 1:48 PM, Height: 61 in, Weight: 120 lbs, BMI: 22.7, Temperature: 97.8 F, Pulse: 74 bpm, Respiratory Rate: 18 breaths/min, Blood Pressure: 133/67 mmHg. Respiratory Respiratory effort is easy and symmetric bilaterally. Rate is normal at rest and on room air.. Cardiovascular Pedal pulses palpable and strong bilaterally.. there is no edema in her bilateral lower legs. Lymphatic none palpable in. Psychiatric in case there is some general questioning she seems to be fairly cognitively intact around recent and remote or current events. General Notes: wound exam; the patient appears to make making fairly good progress. No need for debridement. This is close to closing. She has good edema control. We had some discussion with her about stockings down the road although the patient has been noncompliant with this in the past Integumentary (Hair, Skin) other than severe hemosiderin deposition no rash. Wound #3 status is Open. Original cause of wound was Trauma. The wound is located on the Right,Anterior Lower Leg. The wound measures 0.5cm length x 0.4cm width x 0.1cm depth; 0.157cm^2 area and 0.016cm^3 volume. There is no tunneling or undermining noted. There is a large amount of serous drainage noted. The wound margin is flat and intact. There is large (67-100%) red granulation within the wound bed. There is a small (1-33%) amount of necrotic tissue within the wound bed including Adherent Slough. The periwound skin appearance did not exhibit: Callus, Crepitus, Excoriation, Induration, Rash, Scarring, Dry/Scaly, Maceration, Atrophie  Blanche, Cyanosis, Ecchymosis, Hemosiderin Staining, Mottled, Pallor, Rubor, Erythema. Periwound temperature was noted as No Abnormality. The periwound has tenderness on palpation. Assessment Active Problems ICD-10 L97.211 - Non-pressure chronic ulcer of right calf limited to breakdown of skin I87.321 - Chronic venous hypertension (idiopathic) with inflammation of right lower extremity Plan Wound Cleansing: ALEXYA, MCDARIS ([id number]) Wound #3 Right,Anterior Lower Leg: Clean wound with Normal  Saline. May shower with protection. Anesthetic: Wound #3 Right,Anterior Lower Leg: Topical Lidocaine 4% cream applied to wound bed prior to debridement Primary Wound Dressing: Wound #3 Right,Anterior Lower Leg: Silvercel Non-Adherent Secondary Dressing: Wound #3 Right,Anterior Lower Leg: ABD pad Dressing Change Frequency: Wound #3 Right,Anterior Lower Leg: Change Dressing Monday, Wednesday, Friday - changed at [facility] on Wednesdays Follow-up Appointments: Wound #3 Right,Anterior Lower Leg: Return Appointment in 1 week. Edema Control: Wound #3 Right,Anterior Lower Leg: Kerlix and Coban - Right Lower Extremity - wrap lightly from toes to below the knee Additional Orders / Instructions: Wound #3 Right,Anterior Lower Leg: Increase protein intake. Home Health: Wound #3 Right,Anterior Lower Leg: [place] for [place] Nurse may visit PRN to address patient s wound care needs. FACE TO FACE ENCOUNTER: MEDICARE and MEDICAID PATIENTS: I certify that this patient is under my care and that I had a face-to-face encounter that meets the physician face-to-face encounter requirements with this patient on this date. The encounter with the patient was in whole or in part for the following MEDICAL CONDITION: (primary reason for [place]) MEDICAL NECESSITY: I certify, that based on my findings, NURSING services are a medically necessary  home health service. HOME BOUND STATUS: I certify that my clinical findings support that this patient is homebound (i.e., Due to illness or injury, pt requires aid of supportive devices such as crutches, cane, wheelchairs, walkers, the use of special transportation or the assistance of another person to leave their place of residence. There is a normal inability to leave the home and doing so requires considerable and taxing effort. Other absences are for medical reasons / religious services and are infrequent or of short duration when for other reasons). If current dressing causes regression in wound condition, may D/C ordered dressing product/s and apply Normal Saline Moist Dressing daily until next [place] / Other MD appointment. [place] of regression in wound condition at [phone]. Please direct any NON-WOUND related issues/requests for orders to patient's Primary Care Physician #1continue with silver alginate/ABDs/Kerlix and Coban #2 she has home help changing the dressing #3 this should be healed next week and then we get into the issue of stockings I believe she lives alone with minimal support Electronic Signature(s) Signed: 06/07/2017 5:39:07 PM By: Linton Ham MD Entered By: Linton Ham on 06/07/2017 17:33:36 HARLEI, LEHRMANN ([id number]) CHANNEL, PAPANDREA ([id number]) -------------------------------------------------------------------------------- [place] Details Patient Name: VERMA, GROTHAUS. Date of Service: 06/07/2017 Medical Record Number: [id number] Patient Account Number: [id number] Date of Birth/Sex: 1920/03/18 (83 y.o. Female) Treating RN: Montey Hora Primary Care Provider: Einar Pheasant Other Clinician: Referring Provider: Einar Pheasant Treating Provider/Extender: Tito Dine in Treatment: 2 Diagnosis Coding ICD-10 Codes Code Description O17.510 Non-pressure chronic ulcer of right calf  limited to breakdown of skin I87.321 Chronic venous hypertension (idiopathic) with inflammation of right lower extremity Facility Procedures CPT4 Code: [id number] Description: 99213 - WOUND CARE VISIT-LEV 3 EST PT Modifier: Quantity: 1 Physician Procedures CPT4 Code Description: [id number] 14431 - WC PHYS LEVEL 3 - EST PT ICD-10 Diagnosis Description L97.211 Non-pressure chronic ulcer of right calf limited to breakdown I87.321 Chronic venous hypertension (idiopathic) with inflammation of Modifier: of skin right lower extr Quantity: 1 emity Electronic Signature(s) Signed: 06/07/2017 5:39:07 PM By: Linton Ham MD Previous Signature: 06/07/2017 4:31:30 PM Version By: Montey Hora Entered By: Linton Ham on 06/07/2017 17:33:54

## 2017-06-12 DIAGNOSIS — H401123 Primary open-angle glaucoma, left eye, severe stage: Secondary | ICD-10-CM | POA: Diagnosis not present

## 2017-06-12 DIAGNOSIS — H47233 Glaucomatous optic atrophy, bilateral: Secondary | ICD-10-CM | POA: Diagnosis not present

## 2017-06-12 DIAGNOSIS — H353131 Nonexudative age-related macular degeneration, bilateral, early dry stage: Secondary | ICD-10-CM | POA: Diagnosis not present

## 2017-06-12 DIAGNOSIS — S0502XA Injury of conjunctiva and corneal abrasion without foreign body, left eye, initial encounter: Secondary | ICD-10-CM | POA: Diagnosis not present

## 2017-06-12 DIAGNOSIS — H524 Presbyopia: Secondary | ICD-10-CM | POA: Diagnosis not present

## 2017-06-12 DIAGNOSIS — H5211 Myopia, right eye: Secondary | ICD-10-CM | POA: Diagnosis not present

## 2017-06-12 DIAGNOSIS — H52221 Regular astigmatism, right eye: Secondary | ICD-10-CM | POA: Diagnosis not present

## 2017-06-12 DIAGNOSIS — G3184 Mild cognitive impairment, so stated: Secondary | ICD-10-CM | POA: Diagnosis not present

## 2017-06-12 DIAGNOSIS — H1820 Unspecified corneal edema: Secondary | ICD-10-CM | POA: Diagnosis not present

## 2017-06-12 DIAGNOSIS — H53121 Transient visual loss, right eye: Secondary | ICD-10-CM | POA: Diagnosis not present

## 2017-06-12 DIAGNOSIS — H04123 Dry eye syndrome of bilateral lacrimal glands: Secondary | ICD-10-CM | POA: Diagnosis not present

## 2017-06-12 DIAGNOSIS — H35363 Drusen (degenerative) of macula, bilateral: Secondary | ICD-10-CM | POA: Diagnosis not present

## 2017-06-12 DIAGNOSIS — H401113 Primary open-angle glaucoma, right eye, severe stage: Secondary | ICD-10-CM | POA: Diagnosis not present

## 2017-06-13 ENCOUNTER — Ambulatory Visit: Payer: Medicare Other | Admitting: Internal Medicine

## 2017-06-13 DIAGNOSIS — S81811D Laceration without foreign body, right lower leg, subsequent encounter: Secondary | ICD-10-CM | POA: Diagnosis not present

## 2017-06-13 DIAGNOSIS — G4730 Sleep apnea, unspecified: Secondary | ICD-10-CM | POA: Diagnosis not present

## 2017-06-13 DIAGNOSIS — M1991 Primary osteoarthritis, unspecified site: Secondary | ICD-10-CM | POA: Diagnosis not present

## 2017-06-13 DIAGNOSIS — I87321 Chronic venous hypertension (idiopathic) with inflammation of right lower extremity: Secondary | ICD-10-CM | POA: Diagnosis not present

## 2017-06-13 DIAGNOSIS — H409 Unspecified glaucoma: Secondary | ICD-10-CM | POA: Diagnosis not present

## 2017-06-14 ENCOUNTER — Encounter: Payer: Medicare Other | Admitting: Internal Medicine

## 2017-06-14 DIAGNOSIS — L97812 Non-pressure chronic ulcer of other part of right lower leg with fat layer exposed: Secondary | ICD-10-CM | POA: Diagnosis not present

## 2017-06-14 DIAGNOSIS — L97211 Non-pressure chronic ulcer of right calf limited to breakdown of skin: Secondary | ICD-10-CM | POA: Diagnosis not present

## 2017-06-14 DIAGNOSIS — I87321 Chronic venous hypertension (idiopathic) with inflammation of right lower extremity: Secondary | ICD-10-CM | POA: Diagnosis not present

## 2017-06-15 DIAGNOSIS — S0502XD Injury of conjunctiva and corneal abrasion without foreign body, left eye, subsequent encounter: Secondary | ICD-10-CM | POA: Diagnosis not present

## 2017-06-15 DIAGNOSIS — H47233 Glaucomatous optic atrophy, bilateral: Secondary | ICD-10-CM | POA: Diagnosis not present

## 2017-06-15 DIAGNOSIS — H52221 Regular astigmatism, right eye: Secondary | ICD-10-CM | POA: Diagnosis not present

## 2017-06-15 DIAGNOSIS — I87321 Chronic venous hypertension (idiopathic) with inflammation of right lower extremity: Secondary | ICD-10-CM | POA: Diagnosis not present

## 2017-06-15 DIAGNOSIS — G4730 Sleep apnea, unspecified: Secondary | ICD-10-CM | POA: Diagnosis not present

## 2017-06-15 DIAGNOSIS — H353131 Nonexudative age-related macular degeneration, bilateral, early dry stage: Secondary | ICD-10-CM | POA: Diagnosis not present

## 2017-06-15 DIAGNOSIS — H401113 Primary open-angle glaucoma, right eye, severe stage: Secondary | ICD-10-CM | POA: Diagnosis not present

## 2017-06-15 DIAGNOSIS — S81811D Laceration without foreign body, right lower leg, subsequent encounter: Secondary | ICD-10-CM | POA: Diagnosis not present

## 2017-06-15 DIAGNOSIS — H53121 Transient visual loss, right eye: Secondary | ICD-10-CM | POA: Diagnosis not present

## 2017-06-15 DIAGNOSIS — H409 Unspecified glaucoma: Secondary | ICD-10-CM | POA: Diagnosis not present

## 2017-06-15 DIAGNOSIS — H401123 Primary open-angle glaucoma, left eye, severe stage: Secondary | ICD-10-CM | POA: Diagnosis not present

## 2017-06-15 DIAGNOSIS — H524 Presbyopia: Secondary | ICD-10-CM | POA: Diagnosis not present

## 2017-06-15 DIAGNOSIS — H5211 Myopia, right eye: Secondary | ICD-10-CM | POA: Diagnosis not present

## 2017-06-15 DIAGNOSIS — H35363 Drusen (degenerative) of macula, bilateral: Secondary | ICD-10-CM | POA: Diagnosis not present

## 2017-06-15 DIAGNOSIS — H04123 Dry eye syndrome of bilateral lacrimal glands: Secondary | ICD-10-CM | POA: Diagnosis not present

## 2017-06-15 DIAGNOSIS — M1991 Primary osteoarthritis, unspecified site: Secondary | ICD-10-CM | POA: Diagnosis not present

## 2017-06-15 DIAGNOSIS — H1820 Unspecified corneal edema: Secondary | ICD-10-CM | POA: Diagnosis not present

## 2017-06-15 NOTE — Progress Notes (Signed)
[name], [name] ([id number]) Visit Report for 06/14/2017 Arrival Information Details Patient Name: Christy Cisneros, Christy Cisneros. Date of Service: 06/14/2017 8:00 AM Medical Record Number: [id number] Patient Account Number: [id number] Date of Birth/Sex: 1919-09-19 (83 y.o. Female) Treating RN: Cornell Barman Primary Care Josey Dettmann: Einar Pheasant Other Clinician: Referring Keyonta Barradas: Einar Pheasant Treating Medardo Hassing/Extender: Tito Dine in Treatment: 3 Visit Information History Since Last Visit Added or deleted any medications: No Patient Arrived: Ambulatory Any new allergies or adverse reactions: No Arrival Time: 08:13 Had a fall or experienced change in No Accompanied By: friend activities of daily living that may affect Transfer Assistance: Manual risk of falls: Patient Identification Verified: Yes Signs or symptoms of abuse/neglect since last visito No Secondary Verification Process Completed: Yes Hospitalized since last visit: No Patient Has Alerts: Yes Has Dressing in Place as Prescribed: Yes Patient Alerts: aspirin 81, plavix Pain Present Now: No ABI R 1.09 Electronic Signature(s) Signed: 06/14/2017 1:31:09 PM By: Gretta Cool, BSN, RN, CWS, Kim RN, BSN Entered By: Gretta Cool, BSN, RN, CWS, Kim on 06/14/2017 08:14:19 [name] ([id number]) -------------------------------------------------------------------------------- Clinic Level of Care Assessment Details Patient Name: Christy Cisneros. Date of Service: 06/14/2017 8:00 AM Medical Record Number: [id number] Patient Account Number: [id number] Date of Birth/Sex: [birth year] (83 y.o. Female) Treating RN: Cornell Barman Primary Care Andrius Andrepont: Einar Pheasant Other Clinician: Referring Jacia Sickman: Einar Pheasant Treating Rakiya Krawczyk/Extender: Tito Dine in Treatment: 3 Clinic Level of Care Assessment Items TOOL 4 Quantity Score []  - Use when only an EandM is performed on FOLLOW-UP visit 0 ASSESSMENTS -  Nursing Assessment / Reassessment []  - Reassessment of Co-morbidities (includes updates in patient status) 0 X- 1 5 Reassessment of Adherence to Treatment Plan ASSESSMENTS - Wound and Skin Assessment / Reassessment X - Simple Wound Assessment / Reassessment - one wound 1 5 []  - 0 Complex Wound Assessment / Reassessment - multiple wounds []  - 0 Dermatologic / Skin Assessment (not related to wound area) ASSESSMENTS - Focused Assessment []  - Circumferential Edema Measurements - multi extremities 0 []  - 0 Nutritional Assessment / Counseling / Intervention []  - 0 Lower Extremity Assessment (monofilament, tuning fork, pulses) []  - 0 Peripheral Arterial Disease Assessment (using hand held doppler) ASSESSMENTS - Ostomy and/or Continence Assessment and Care []  - Incontinence Assessment and Management 0 []  - 0 Ostomy Care Assessment and Management (repouching, etc.) PROCESS - Coordination of Care X - Simple Patient / Family Education for ongoing care 1 15 []  - 0 Complex (extensive) Patient / Family Education for ongoing care []  - 0 Staff obtains Programmer, systems, Records, Test Results / Process Orders []  - 0 Staff telephones HHA, Nursing Homes / Clarify orders / etc []  - 0 Routine Transfer to another Facility (non-emergent condition) []  - 0 Routine Hospital Admission (non-emergent condition) []  - 0 New Admissions / Biomedical engineer / Ordering NPWT, Apligraf, etc. []  - 0 Emergency Hospital Admission (emergent condition) X- 1 10 Simple Discharge Coordination JASHAY, RODDY ([id number]) []  - 0 Complex (extensive) Discharge Coordination PROCESS - Special Needs []  - Pediatric / Minor Patient Management 0 []  - 0 Isolation Patient Management []  - 0 Hearing / Language / Visual special needs []  - 0 Assessment of Community assistance (transportation, D/C planning, etc.) []  - 0 Additional assistance / Altered mentation []  - 0 Support Surface(s) Assessment (bed, cushion, seat,  etc.) INTERVENTIONS - Wound Cleansing / Measurement X - Simple Wound Cleansing - one wound 1 5 []  - 0 Complex Wound Cleansing - multiple wounds X- 1 5 Wound Imaging (  photographs - any number of wounds) []  - 0 Wound Tracing (instead of photographs) X- 1 5 Simple Wound Measurement - one wound []  - 0 Complex Wound Measurement - multiple wounds INTERVENTIONS - Wound Dressings []  - Small Wound Dressing one or multiple wounds 0 []  - 0 Medium Wound Dressing one or multiple wounds []  - 0 Large Wound Dressing one or multiple wounds []  - 0 Application of Medications - topical []  - 0 Application of Medications - injection INTERVENTIONS - Miscellaneous []  - External ear exam 0 []  - 0 Specimen Collection (cultures, biopsies, blood, body fluids, etc.) []  - 0 Specimen(s) / Culture(s) sent or taken to Lab for analysis []  - 0 Patient Transfer (multiple staff / Civil Service fast streamer / Similar devices) []  - 0 Simple Staple / Suture removal (25 or less) []  - 0 Complex Staple / Suture removal (26 or more) []  - 0 Hypo / Hyperglycemic Management (close monitor of Blood Glucose) []  - 0 Ankle / Brachial Index (ABI) - do not check if billed separately X- 1 5 Vital Signs AVA, TANGNEY ([id number]) Has the patient been seen at the hospital within the last three years: Yes Total Score: 55 Level Of Care: New/Established - Level 2 Electronic Signature(s) Signed: 06/14/2017 1:31:09 PM By: Gretta Cool, BSN, RN, CWS, Kim RN, BSN Entered By: Gretta Cool, BSN, RN, CWS, Kim on 06/14/2017 08:45:27 [name] ([id number]) -------------------------------------------------------------------------------- Encounter Discharge Information Details Patient Name: Christy Cisneros. Date of Service: 06/14/2017 8:00 AM Medical Record Number: [id number] Patient Account Number: [id number] Date of Birth/Sex: [birth year] (83 y.o. Female) Treating RN: Montey Hora Primary Care Shavone Nevers: Einar Pheasant Other  Clinician: Referring Vian Fluegel: Einar Pheasant Treating Reynaldo Rossman/Extender: Tito Dine in Treatment: 3 Encounter Discharge Information Items Discharge Pain Level: 0 Discharge Condition: Stable Ambulatory Status: Ambulatory Discharge Destination: Home Transportation: Private Auto Accompanied By: friend Schedule Follow-up Appointment: No Medication Reconciliation completed and Yes provided to Patient/Care Shanieka Blea: Provided on Clinical Summary of Care: 06/14/2017 Form Type Recipient Paper Patient FO Electronic Signature(s) Signed: 06/14/2017 1:31:09 PM By: Gretta Cool, BSN, RN, CWS, Kim RN, BSN Entered By: Gretta Cool, BSN, RN, CWS, Kim on 06/14/2017 08:45:56 [name] ([id number]) -------------------------------------------------------------------------------- Lower Extremity Assessment Details Patient Name: MORNA, FLUD. Date of Service: 06/14/2017 8:00 AM Medical Record Number: [id number] Patient Account Number: [id number] Date of Birth/Sex: June 10, 1920 (83 y.o. Female) Treating RN: Cornell Barman Primary Care Nikita Humble: Einar Pheasant Other Clinician: Referring Malaijah Houchen: Einar Pheasant Treating Pearson Picou/Extender: Tito Dine in Treatment: 3 Vascular Assessment Pulses: Dorsalis Pedis Palpable: [Right:Yes] Posterior Tibial Extremity colors, hair growth, and conditions: Extremity Color: [Right:Hyperpigmented] Hair Growth on Extremity: [Right:Yes] Temperature of Extremity: [Right:Warm] Capillary Refill: [Right:> 3 seconds] Toe Nail Assessment Left: Right: Thick: No Discolored: No Deformed: No Improper Length and Hygiene: No Electronic Signature(s) Signed: 06/14/2017 1:31:09 PM By: Gretta Cool, BSN, RN, CWS, Kim RN, BSN Entered By: Gretta Cool, BSN, RN, CWS, Kim on 06/14/2017 08:18:04 [name] ([id number]) -------------------------------------------------------------------------------- Multi Wound Chart Details Patient Name: [name]. Date of Service: 06/14/2017 8:00 AM Medical Record Number: [id number] Patient Account Number: [id number] Date of Birth/Sex: 04/02/1920 (83 y.o. Female) Treating RN: Cornell Barman Primary Care Keondria Siever: Einar Pheasant Other Clinician: Referring Amiel Sharrow: Einar Pheasant Treating Michaelene Dutan/Extender: Tito Dine in Treatment: 3 Vital Signs Height(in): 61 Pulse(bpm): 81 Weight(lbs): 120 Blood Pressure(mmHg): 94/62 Body Mass Index(BMI): 23 Temperature(F): 97.7 Respiratory Rate 16 (breaths/min): Photos: [N/A:N/A] Wound Location: Right, Anterior Lower Leg N/A N/A Wounding Event: Trauma N/A N/A Primary Etiology: Trauma, Other N/A N/A  Comorbid History: Glaucoma, Sleep Apnea, N/A N/A Osteoarthritis Date Acquired: 04/25/2017 N/A N/A Weeks of Treatment: 3 N/A N/A Wound Status: Healed - Epithelialized N/A N/A Measurements L x W x D 0x0x0 N/A N/A (cm) Area (cm) : 0 N/A N/A Volume (cm) : 0 N/A N/A % Reduction in Area: 100.00% N/A N/A % Reduction in Volume: 100.00% N/A N/A Classification: Full Thickness Without N/A N/A Exposed Support Structures Exudate Amount: Large N/A N/A Exudate Type: Serous N/A N/A Exudate Color: amber N/A N/A Wound Margin: Flat and Intact N/A N/A Granulation Amount: Large (67-100%) N/A N/A Granulation Quality: Red N/A N/A Necrotic Amount: Small (1-33%) N/A N/A Exposed Structures: Fascia: No N/A N/A Fat Layer (Subcutaneous Tissue) Exposed: No Tendon: No Muscle: No Joint: No Bone: No Epithelialization: Medium (34-66%) N/A N/A KRYSTEL, FLETCHALL. ([id number]) Periwound Skin Texture: Excoriation: No N/A N/A Induration: No Callus: No Crepitus: No Rash: No Scarring: No Periwound Skin Moisture: Maceration: No N/A N/A Dry/Scaly: No Periwound Skin Color: Atrophie Blanche: No N/A N/A Cyanosis: No Ecchymosis: No Erythema: No Hemosiderin Staining: No Mottled: No Pallor: No Rubor: No Temperature: No Abnormality N/A  N/A Tenderness on Palpation: Yes N/A N/A Wound Preparation: Ulcer Cleansing: N/A N/A Rinsed/Irrigated with Saline Topical Anesthetic Applied: Other: lidocaine 4% Treatment Notes Electronic Signature(s) Signed: 06/14/2017 4:36:42 PM By: Linton Ham MD Entered By: Linton Ham on 06/14/2017 08:41:54 [name] ([id number]) -------------------------------------------------------------------------------- [place] Details Patient Name: MADORA, BARLETTA. Date of Service: 06/14/2017 8:00 AM Medical Record Number: [id number] Patient Account Number: [id number] Date of Birth/Sex: June 08, 1920 (83 y.o. Female) Treating RN: Cornell Barman Primary Care Russ Looper: Einar Pheasant Other Clinician: Referring Chanel Mckesson: Einar Pheasant Treating Kijuan Gallicchio/Extender: Tito Dine in Treatment: 3 Active Inactive Electronic Signature(s) Signed: 06/14/2017 1:31:09 PM By: Gretta Cool, BSN, RN, CWS, Kim RN, BSN Entered By: Gretta Cool, BSN, RN, CWS, Kim on 06/14/2017 08:21:38 [name] ([id number]) -------------------------------------------------------------------------------- Pain Assessment Details Patient Name: GENITA, NILSSON. Date of Service: 06/14/2017 8:00 AM Medical Record Number: [id number] Patient Account Number: [id number] Date of Birth/Sex: 1919-09-29 (83 y.o. Female) Treating RN: Cornell Barman Primary Care Rashauna Tep: Einar Pheasant Other Clinician: Referring Marialice Newkirk: Einar Pheasant Treating Cerena Baine/Extender: Tito Dine in Treatment: 3 Active Problems Location of Pain Severity and Description of Pain Patient Has Paino No Site Locations With Dressing Change: No Pain Management and Medication Current Pain Management: Goals for Pain Management Topical or injectable lidocaine is offered to patient for acute pain when surgical debridement is performed. If needed, Patient is instructed to use over the counter pain medication for  the following 24-48 hours after debridement. Wound care MDs do not prescribed pain medications. Patient has chronic pain or uncontrolled pain. Patient has been instructed to make an appointment with their Primary Care Physician for pain management. Electronic Signature(s) Signed: 06/14/2017 1:31:09 PM By: Gretta Cool, BSN, RN, CWS, Kim RN, BSN Entered By: Gretta Cool, BSN, RN, CWS, Kim on 06/14/2017 08:14:31 [name] ([id number]) -------------------------------------------------------------------------------- Patient/Caregiver Education Details Patient Name: NARELY, NOBLES. Date of Service: 06/14/2017 8:00 AM Medical Record Number: [id number] Patient Account Number: [id number] Date of Birth/Gender: 1919-11-22 (83 y.o. Female) Treating RN: Cornell Barman Primary Care Physician: Einar Pheasant Other Clinician: Referring Physician: Einar Pheasant Treating Physician/Extender: Tito Dine in Treatment: 3 Education Assessment Education Provided To: Patient and Caregiver Education Topics Provided Venous: Handouts: Controlling Swelling with Compression Stockings , Other: wear stockings daily Methods: Demonstration, Explain/Verbal Responses: State content correctly Electronic Signature(s) Signed: 06/14/2017 1:31:09 PM By: Gretta Cool, BSN, RN, CWS, Kim RN,  BSN Entered By: Gretta Cool, BSN, RN, CWS, Kim on 06/14/2017 08:46:27 [name] ([id number]) -------------------------------------------------------------------------------- Wound Assessment Details Patient Name: ALLIENE, KLUGH. Date of Service: 06/14/2017 8:00 AM Medical Record Number: [id number] Patient Account Number: [id number] Date of Birth/Sex: [birth year] (83 y.o. Female) Treating RN: Cornell Barman Primary Care Maveryk Renstrom: Einar Pheasant Other Clinician: Referring Jalexia Lalli: Einar Pheasant Treating Aizlyn Schifano/Extender: Tito Dine in Treatment: 3 Wound Status Wound Number: 3 Primary Etiology: Trauma,  Other Wound Location: Right, Anterior Lower Leg Wound Status: Healed - Epithelialized Wounding Event: Trauma Comorbid History: Glaucoma, Sleep Apnea, Osteoarthritis Date Acquired: 04/25/2017 Weeks Of Treatment: 3 Clustered Wound: No Photos Wound Measurements Length: (cm) 0 % Reduct Width: (cm) 0 % Reduct Depth: (cm) 0 Epitheli Area: (cm) 0 Volume: (cm) 0 ion in Area: 100% ion in Volume: 100% alization: Medium (34-66%) Wound Description Full Thickness Without Exposed Support Foul Odo Classification: Structures Slough/F Wound Margin: Flat and Intact Exudate Large Amount: Exudate Type: Serous Exudate Color: amber r After Cleansing: No ibrino Yes Wound Bed Granulation Amount: Large (67-100%) Exposed Structure Granulation Quality: Red Fascia Exposed: No Necrotic Amount: Small (1-33%) Fat Layer (Subcutaneous Tissue) Exposed: No Necrotic Quality: Adherent Slough Tendon Exposed: No Muscle Exposed: No Joint Exposed: No Bone Exposed: No Periwound Skin Texture Texture Color No Abnormalities Noted: No No Abnormalities Noted: No SURIAH, PERAGINE ([id number]) Callus: No Atrophie Blanche: No Crepitus: No Cyanosis: No Excoriation: No Ecchymosis: No Induration: No Erythema: No Rash: No Hemosiderin Staining: No Scarring: No Mottled: No Pallor: No Moisture Rubor: No No Abnormalities Noted: No Dry / Scaly: No Temperature / Pain Maceration: No Temperature: No Abnormality Tenderness on Palpation: Yes Wound Preparation Ulcer Cleansing: Rinsed/Irrigated with Saline Topical Anesthetic Applied: Other: lidocaine 4%, Electronic Signature(s) Signed: 06/14/2017 1:31:09 PM By: Gretta Cool, BSN, RN, CWS, Kim RN, BSN Entered By: Gretta Cool, BSN, RN, CWS, Kim on 06/14/2017 08:20:19 ABEGAIL, KLOEPPEL ([id number]) -------------------------------------------------------------------------------- [place] Details Patient Name: DAYLE, SHERPA. Date of Service: 06/14/2017 8:00  AM Medical Record Number: [id number] Patient Account Number: [id number] Date of Birth/Sex: 07/24/1920 (83 y.o. Female) Treating RN: Cornell Barman Primary Care Sherri Mcarthy: Einar Pheasant Other Clinician: Referring Irven Ingalsbe: Einar Pheasant Treating Kenzie Thoreson/Extender: Tito Dine in Treatment: 3 Vital Signs Time Taken: 08:14 Temperature (F): 97.7 Height (in): 61 Pulse (bpm): 81 Weight (lbs): 120 Respiratory Rate (breaths/min): 16 Body Mass Index (BMI): 22.7 Blood Pressure (mmHg): 94/62 Reference Range: 80 - 120 mg / dl Electronic Signature(s) Signed: 06/14/2017 1:31:09 PM By: Gretta Cool, BSN, RN, CWS, Kim RN, BSN Entered By: Gretta Cool, BSN, RN, CWS, Kim on 06/14/2017 08:14:51

## 2017-06-15 NOTE — Progress Notes (Signed)
LYNNITA, SOMMA ([id number]) Visit Report for 06/14/2017 HPI Details Patient Name: Christy, Cisneros. Date of Service: 06/14/2017 8:00 AM Medical Record Number: [id number] Patient Account Number: [id number] Date of Birth/Sex: 1920/02/09 (83 y.o. Female) Treating RN: Montey Hora Primary Care Provider: Einar Pheasant Other Clinician: Referring Provider: Einar Pheasant Treating Provider/Extender: Tito Dine in Treatment: 3 History of Present Illness HPI Description: Very pleasant 83 year old with no significant past medical history. No diabetes or peripheral vascular disease. She traumatized her right anterior calf on a car door on 07/08/2014. She is slowly improving with regular debridements, compression, and. No significant pain. No claudication or rest pain. Right ABI 1.02. She walks a mile a day. No drainage. No fever or chills. She returns to clinic today and says that the ulceration has healed. 12/20/16; This is a 83 year old women women who traumatized her right leg against a table while adjusting a shade in her home. She suffered a skin tear. She was seen 2 weeks ago in her primary doctor's office and had a non stick dressing placed with kerlix and coban and that has apparently remained in place since then. She is complaining of pain but no systemic symptoms. I note she has been in this clinic 3 years ago for a similar presentation on the right leg.She was not seen by any of the current providers in our practice. She has compression stockings at home but does not use them. She has venous insufficiency. ABI's in this clinic 1.09 12/27/16; arrives today with the wound in better condition. Dimension smaller. We've been using Prisma 01/04/17; patient's wound on the right anterior calf looks a lot better this week dimension smaller base of the wound looks healthy. She has been using Prisma under 2 layer compression. She has chronic bilateral venous insufficiency and tells  me she has stockings at home that she has not been wearing 01/11/17; continued improvement in the right anterior lateral wound which was initially trauma in the setting of chronic venous insufficiency. We have been using Prisma under 2 layer compression 01/18/17; continued improvement in the right anterior leg wound which was initially traumatic in the setting of chronic venous insufficiency. We have been using Prisma under 2 layer compression. Nice improvement in terms of wound dimensions 01/25/17; right anterior leg wound which was initially trauma in the setting of chronic venous insufficiency. She does not have much in the way of edema however skin damage secondary to chronic hemosiderin deposition is quite apparent right greater than left. Her wound today is totally healed over. She brought in a support stocking. READMISSION 05/24/17; this is a 83 year old woman who traumatized her right leg sometime in the last 2-3 weeks. She has very frail skin secondary to chronic venous insufficiency with marked bilateral hemosiderin deposition. She was seen in her primary physician's office on 05/12/17 and treated with doxycycline and Bactroban. We suggested compression stockings or at least support stockings last time although she is not compliant with these. Her ABI in this clinic was 1.09 05/31/17 on evaluation today patient appears to be doing fairly well in regard to her right lower extremity wound. Unfortunately the collagen is taking significantly to the wound bed likely due to the fact that this was wraps once and then left ear in the week before reevaluation here in our office. Obviously the wound is not draining very much. With that being said she is having no significant discomfort at this time which is good news. No fevers, chills, nausea, or vomiting noted  at this time. 06/07/17; the patient continues to do nicely in terms of her traumatic right leg wound in the setting of chronic  venous insufficiency and very frail skin with bilateral severe hemosiderin deposition. There is no evidence of infection 06/14/17; the patient continues to do nicely in terms of the traumatic right leg wound in the setting of chronic venous insufficiency and hemosiderin deposition. She has closed over today she does not have any open wound. She has tended [place] ([id number]) mm support stockings which were chosen largely because she is going to have to get these on herself. Furthermore she does not have major edema Electronic Signature(s) Signed: 06/14/2017 4:36:42 PM By: Linton Ham MD Entered By: Linton Ham on 06/14/2017 08:42:47 Christy Cisneros ([id number]) -------------------------------------------------------------------------------- Physical Exam Details Patient Name: Christy, Cisneros. Date of Service: 06/14/2017 8:00 AM Medical Record Number: [id number] Patient Account Number: [id number] Date of Birth/Sex: 07-19-1920 (83 y.o. Female) Treating RN: Montey Hora Primary Care Provider: Einar Pheasant Other Clinician: Referring Provider: Einar Pheasant Treating Provider/Extender: Tito Dine in Treatment: 3 Constitutional Patient is hypotensive. However she appears well. Pulse regular and within target range for patient.Marland Kitchen Respirations regular, non-labored and within target range.. Temperature is normal and within the target range for the patient.Marland Kitchen appears in no distress. Respiratory Respiratory effort is easy and symmetric bilaterally. Rate is normal at rest and on room air.. Cardiovascular Pedal pulses are palpable. Edema present in both extremities. For the edema is really only 1+. Lymphatic None palpable in the popliteal or inguinal area. Integumentary (Hair, Skin) Very frail skin in her bilateral lower extremities hemosiderin deposition. No systemic rash. Psychiatric Patient appears to be normal in terms of her mood. I wonder  about her cognition. Notes Wound exam; the patient is closed over. She has very frail skin with widespread hemosiderin deposition. This is I believe her second trip to our clinic both of these are secondary to incidental trauma. She has 10-15 mm support stockings I think for now these are adequate and we'll balance her ability to get them on versus protection of the skin and control of her edema Electronic Signature(s) Signed: 06/14/2017 4:36:42 PM By: Linton Ham MD Entered By: Linton Ham on 06/14/2017 08:45:06 Christy Cisneros ([id number]) -------------------------------------------------------------------------------- Physician Orders Details Patient Name: Christy, Cisneros. Date of Service: 06/14/2017 8:00 AM Medical Record Number: [id number] Patient Account Number: [id number] Date of Birth/Sex: 06/28/1920 (83 y.o. Female) Treating RN: Cornell Barman Primary Care Provider: Einar Pheasant Other Clinician: Referring Provider: Einar Pheasant Treating Provider/Extender: Tito Dine in Treatment: 3 Verbal / Phone Orders: No Diagnosis Coding Edema Control o Patient to wear own compression stockings Discharge From [place] o Discharge from [place] - treatment complete Electronic Signature(s) Signed: 06/14/2017 1:31:09 PM By: Gretta Cool, BSN, RN, CWS, Kim RN, BSN Signed: 06/14/2017 4:36:42 PM By: Linton Ham MD Entered By: Gretta Cool, BSN, RN, CWS, Kim on 06/14/2017 08:22:38 Christy, Cisneros ([id number]) -------------------------------------------------------------------------------- Problem List Details Patient Name: Christy, Cisneros. Date of Service: 06/14/2017 8:00 AM Medical Record Number: [id number] Patient Account Number: [id number] Date of Birth/Sex: [birth year] (83 y.o. Female) Treating RN: Montey Hora Primary Care Provider: Einar Pheasant Other Clinician: Referring Provider: Einar Pheasant Treating Provider/Extender: Tito Dine in Treatment: 3 Active Problems ICD-10 Encounter Code Description Active Date Diagnosis L97.211 Non-pressure chronic ulcer of right calf limited to breakdown of skin 05/24/2017 Yes I87.321 Chronic venous hypertension (idiopathic) with inflammation of right 05/24/2017 Yes lower extremity Inactive Problems  Resolved Problems Electronic Signature(s) Signed: 06/14/2017 4:36:42 PM By: Linton Ham MD Entered By: Linton Ham on 06/14/2017 08:41:45 Christy Cisneros ([id number]) -------------------------------------------------------------------------------- Progress Note Details Patient Name: Christy, Cisneros. Date of Service: 06/14/2017 8:00 AM Medical Record Number: [id number] Patient Account Number: [id number] Date of Birth/Sex: 1919/08/06 (83 y.o. Female) Treating RN: Montey Hora Primary Care Provider: Einar Pheasant Other Clinician: Referring Provider: Einar Pheasant Treating Provider/Extender: Tito Dine in Treatment: 3 Subjective History of Present Illness (HPI) Very pleasant 83 year old with no significant past medical history. No diabetes or peripheral vascular disease. She traumatized her right anterior calf on a car door on 07/08/2014. She is slowly improving with regular debridements, compression, and. No significant pain. No claudication or rest pain. Right ABI 1.02. She walks a mile a day. No drainage. No fever or chills. She returns to clinic today and says that the ulceration has healed. 12/20/16; This is a 83 year old women who traumatized her right leg against a table while adjusting a shade in her home. She suffered a skin tear. She was seen 2 weeks ago in her primary doctor's office and had a non stick dressing placed with kerlix and coban and that has apparently remained in place since then. She is complaining of pain but no systemic symptoms. I note she has been in this clinic 3 years ago for a similar presentation on  the right leg.She was not seen by any of the current providers in our practice. She has compression stockings at home but does not use them. She has venous insufficiency. ABI's in this clinic 1.09 12/27/16; arrives today with the wound in better condition. Dimension smaller. We've been using Prisma 01/04/17; patient's wound on the right anterior calf looks a lot better this week dimension smaller base of the wound looks healthy. She has been using Prisma under 2 layer compression. She has chronic bilateral venous insufficiency and tells me she has stockings at home that she has not been wearing 01/11/17; continued improvement in the right anterior lateral wound which was initially trauma in the setting of chronic venous insufficiency. We have been using Prisma under 2 layer compression 01/18/17; continued improvement in the right anterior leg wound which was initially traumatic in the setting of chronic venous insufficiency. We have been using Prisma under 2 layer compression. Nice improvement in terms of wound dimensions 01/25/17; right anterior leg wound which was initially trauma in the setting of chronic venous insufficiency. She does not have much in the way of edema however skin damage secondary to chronic hemosiderin deposition is quite apparent right greater than left. Her wound today is totally healed over. She brought in a support stocking. READMISSION 05/24/17; this is a 83 year old woman who traumatized her right leg sometime in the last 2-3 weeks. She has very frail skin secondary to chronic venous insufficiency with marked bilateral hemosiderin deposition. She was seen in her primary physician's office on 05/12/17 and treated with doxycycline and Bactroban. We suggested compression stockings or at least support stockings last time although she is not compliant with these. Her ABI in this clinic was 1.09 05/31/17 on evaluation today patient appears to be doing fairly well in regard to her  right lower extremity wound. Unfortunately the collagen is taking significantly to the wound bed likely due to the fact that this was wraps once and then left ear in the week before reevaluation here in our office. Obviously the wound is not draining very much. With that being said she is  having no significant discomfort at this time which is good news. No fevers, chills, nausea, or vomiting noted at this time. 06/07/17; the patient continues to do nicely in terms of her traumatic right leg wound in the setting of chronic venous insufficiency and very frail skin with bilateral severe hemosiderin deposition. There is no evidence of infection 06/14/17; the patient continues to do nicely in terms of the traumatic right leg wound in the setting of chronic venous insufficiency and hemosiderin deposition. She has closed over today she does not have any open wound. She has tended 15 mm support stockings which were chosen largely because she is going to have to get these on herself. Furthermore she does not have major edema Christy, Cisneros. ([id number]) Objective Constitutional Patient is hypotensive. However she appears well. Pulse regular and within target range for patient.Marland Kitchen Respirations regular, non-labored and within target range.. Temperature is normal and within the target range for the patient.Marland Kitchen appears in no distress. Vitals Time Taken: 8:14 AM, Height: 61 in, Weight: 120 lbs, BMI: 22.7, Temperature: 97.7 F, Pulse: 81 bpm, Respiratory Rate: 16 breaths/min, Blood Pressure: 94/62 mmHg. Respiratory Respiratory effort is easy and symmetric bilaterally. Rate is normal at rest and on room air.. Cardiovascular Pedal pulses are palpable. Edema present in both extremities. For the edema is really only 1+. Lymphatic None palpable in the popliteal or inguinal area. Psychiatric Patient appears to be normal in terms of her mood. I wonder about her cognition. General Notes: Wound exam; the patient  is closed over. She has very frail skin with widespread hemosiderin deposition. This is I believe her second trip to our clinic both of these are secondary to incidental trauma. She has 10-15 mm support stockings I think for now these are adequate and we'll balance her ability to get them on versus protection of the skin and control of her edema Integumentary (Hair, Skin) Very frail skin in her bilateral lower extremities hemosiderin deposition. No systemic rash. Wound #3 status is Healed - Epithelialized. Original cause of wound was Trauma. The wound is located on the Right,Anterior Lower Leg. The wound measures 0cm length x 0cm width x 0cm depth; 0cm^2 area and 0cm^3 volume. There is a large amount of serous drainage noted. The wound margin is flat and intact. There is large (67-100%) red granulation within the wound bed. There is a small (1-33%) amount of necrotic tissue within the wound bed including Adherent Slough. The periwound skin appearance did not exhibit: Callus, Crepitus, Excoriation, Induration, Rash, Scarring, Dry/Scaly, Maceration, Atrophie Blanche, Cyanosis, Ecchymosis, Hemosiderin Staining, Mottled, Pallor, Rubor, Erythema. Periwound temperature was noted as No Abnormality. The periwound has tenderness on palpation. Assessment Active Problems ICD-10 L97.211 - Non-pressure chronic ulcer of right calf limited to breakdown of skin I87.321 - Chronic venous hypertension (idiopathic) with inflammation of right lower extremity Christy, Cisneros ([id number]) Plan Edema Control: Patient to wear own compression stockings Discharge From [facility] Services: Discharge from [place] - treatment complete #1 we discharged her to her own 10-15 mm support stockings. I'm very doubtful that she would be able to get 20-30 stockings on #2 she comes with either a neighbor or some other form of support. I don't think she has family #3 I wonder about her cognition and I wonder about her  ability to continue to live independently Electronic Signature(s) Signed: 06/14/2017 4:36:42 PM By: Linton Ham MD Entered By: Linton Ham on 06/14/2017 08:46:45 Christy Cisneros ([id number]) -------------------------------------------------------------------------------- [place] Details Patient Name: Christy Cisneros.  Date of Service: 06/14/2017 Medical Record Number: [id number] Patient Account Number: [id number] Date of Birth/Sex: Mar 28, 1920 (83 y.o. Female) Treating RN: Montey Hora Primary Care Provider: Einar Pheasant Other Clinician: Referring Provider: Einar Pheasant Treating Provider/Extender: Tito Dine in Treatment: 3 Diagnosis Coding ICD-10 Codes Code Description H88.502 Non-pressure chronic ulcer of right calf limited to breakdown of skin I87.321 Chronic venous hypertension (idiopathic) with inflammation of right lower extremity Facility Procedures CPT4 Code: [id number] Description: [phone] - WOUND CARE VISIT-LEV 2 EST PT Modifier: Quantity: 1 Physician Procedures CPT4 Code Description: [id number] - WC PHYS LEVEL 3 - EST PT ICD-10 Diagnosis Description L97.211 Non-pressure chronic ulcer of right calf limited to breakdown I87.321 Chronic venous hypertension (idiopathic) with inflammation of Modifier: of skin right lower extr Quantity: 1 emity Electronic Signature(s) Signed: 06/14/2017 4:36:42 PM By: Linton Ham MD Entered By: Linton Ham on 06/14/2017 08:47:04

## 2017-06-20 DIAGNOSIS — H401113 Primary open-angle glaucoma, right eye, severe stage: Secondary | ICD-10-CM | POA: Diagnosis not present

## 2017-06-20 DIAGNOSIS — H524 Presbyopia: Secondary | ICD-10-CM | POA: Diagnosis not present

## 2017-06-20 DIAGNOSIS — H04123 Dry eye syndrome of bilateral lacrimal glands: Secondary | ICD-10-CM | POA: Diagnosis not present

## 2017-06-20 DIAGNOSIS — H52221 Regular astigmatism, right eye: Secondary | ICD-10-CM | POA: Diagnosis not present

## 2017-06-20 DIAGNOSIS — H353131 Nonexudative age-related macular degeneration, bilateral, early dry stage: Secondary | ICD-10-CM | POA: Diagnosis not present

## 2017-06-20 DIAGNOSIS — S0502XD Injury of conjunctiva and corneal abrasion without foreign body, left eye, subsequent encounter: Secondary | ICD-10-CM | POA: Diagnosis not present

## 2017-06-20 DIAGNOSIS — H35363 Drusen (degenerative) of macula, bilateral: Secondary | ICD-10-CM | POA: Diagnosis not present

## 2017-06-20 DIAGNOSIS — H1820 Unspecified corneal edema: Secondary | ICD-10-CM | POA: Diagnosis not present

## 2017-06-20 DIAGNOSIS — H53121 Transient visual loss, right eye: Secondary | ICD-10-CM | POA: Diagnosis not present

## 2017-06-20 DIAGNOSIS — H47233 Glaucomatous optic atrophy, bilateral: Secondary | ICD-10-CM | POA: Diagnosis not present

## 2017-06-20 DIAGNOSIS — H5211 Myopia, right eye: Secondary | ICD-10-CM | POA: Diagnosis not present

## 2017-06-20 DIAGNOSIS — H401123 Primary open-angle glaucoma, left eye, severe stage: Secondary | ICD-10-CM | POA: Diagnosis not present

## 2017-06-21 ENCOUNTER — Ambulatory Visit: Payer: Medicare Other | Admitting: Internal Medicine

## 2017-06-21 DIAGNOSIS — S81811D Laceration without foreign body, right lower leg, subsequent encounter: Secondary | ICD-10-CM | POA: Diagnosis not present

## 2017-06-21 DIAGNOSIS — G4730 Sleep apnea, unspecified: Secondary | ICD-10-CM | POA: Diagnosis not present

## 2017-06-21 DIAGNOSIS — I87321 Chronic venous hypertension (idiopathic) with inflammation of right lower extremity: Secondary | ICD-10-CM | POA: Diagnosis not present

## 2017-06-21 DIAGNOSIS — H409 Unspecified glaucoma: Secondary | ICD-10-CM | POA: Diagnosis not present

## 2017-06-21 DIAGNOSIS — M1991 Primary osteoarthritis, unspecified site: Secondary | ICD-10-CM | POA: Diagnosis not present

## 2017-06-27 DIAGNOSIS — M1991 Primary osteoarthritis, unspecified site: Secondary | ICD-10-CM | POA: Diagnosis not present

## 2017-06-27 DIAGNOSIS — G4730 Sleep apnea, unspecified: Secondary | ICD-10-CM | POA: Diagnosis not present

## 2017-06-27 DIAGNOSIS — H409 Unspecified glaucoma: Secondary | ICD-10-CM | POA: Diagnosis not present

## 2017-06-27 DIAGNOSIS — S81811D Laceration without foreign body, right lower leg, subsequent encounter: Secondary | ICD-10-CM | POA: Diagnosis not present

## 2017-06-27 DIAGNOSIS — I87321 Chronic venous hypertension (idiopathic) with inflammation of right lower extremity: Secondary | ICD-10-CM | POA: Diagnosis not present

## 2017-07-03 ENCOUNTER — Ambulatory Visit: Payer: Medicare Other

## 2017-07-04 ENCOUNTER — Ambulatory Visit (INDEPENDENT_AMBULATORY_CARE_PROVIDER_SITE_OTHER): Payer: Medicare Other

## 2017-07-04 ENCOUNTER — Telehealth: Payer: Self-pay | Admitting: Internal Medicine

## 2017-07-04 VITALS — BP 126/78 | HR 77 | Temp 97.8°F | Resp 16 | Ht 59.0 in | Wt 101.1 lb

## 2017-07-04 DIAGNOSIS — Z1331 Encounter for screening for depression: Secondary | ICD-10-CM | POA: Diagnosis not present

## 2017-07-04 DIAGNOSIS — R300 Dysuria: Secondary | ICD-10-CM | POA: Diagnosis not present

## 2017-07-04 DIAGNOSIS — Z0000 Encounter for general adult medical examination without abnormal findings: Secondary | ICD-10-CM

## 2017-07-04 LAB — POCT URINALYSIS DIPSTICK
BILIRUBIN UA: NEGATIVE
GLUCOSE UA: NEGATIVE
KETONES UA: NEGATIVE
Leukocytes, UA: NEGATIVE
NITRITE UA: NEGATIVE
PH UA: 5.5 (ref 5.0–8.0)
PROTEIN UA: 30
RBC UA: NEGATIVE
SPEC GRAV UA: 1.025 (ref 1.010–1.025)
Urobilinogen, UA: 0.2 E.U./dL

## 2017-07-04 LAB — URINALYSIS, MICROSCOPIC ONLY: RBC / HPF: NONE SEEN (ref 0–?)

## 2017-07-04 NOTE — Telephone Encounter (Signed)
Copied from [place]. Topic: Quick Communication - See Telephone Encounter >> Jul 04, 2017  9:01 AM Robina Ade, Helene Kelp D wrote: CRM for notification. See Telephone encounter for: 07/04/17. Patient needs to talk to Dr. Bary Leriche nurse about a package she got and is not sure what it is about. Please call her back, thanks.

## 2017-07-04 NOTE — Telephone Encounter (Signed)
Patient seen for AWV today.  I asked about a package she received that she needed clarification on from Dr. Nicki Reaper. She stated it was in regards to exercises and/or physical therapy and she left it at home.  She plans to bring the package/paperwork to discuss at her next scheduled visit.

## 2017-07-04 NOTE — Progress Notes (Signed)
Subjective:   Christy Cisneros is a 83 y.o. female who presents for Medicare Annual (Subsequent) preventive examination.  Review of Systems:  No ROS.  Medicare Wellness Visit. Additional risk factors are reflected in the social history. Cardiac Risk Factors include: advanced age (>76men, >52 women)     Objective:     Vitals: BP 126/78 (BP Location: Right Arm, Patient Position: Sitting, Cuff Size: Normal)   Pulse 77   Temp 97.8 F (36.6 C) (Oral)   Resp 16   Ht 4\' 11"  (1.499 m)   Wt 101 lb 1.9 oz (45.9 kg)   SpO2 95%   BMI 20.42 kg/m   Body mass index is 20.42 kg/m.  Advanced Directives 07/04/2017 03/16/2017 03/16/2017 03/16/2017 10/14/2016 09/15/2016 09/07/2016  Does Patient Have a Medical Advance Directive? Yes No No No Yes Yes No  Type of Paramedic of [place];Living will - - - Out of facility DNR (pink MOST or yellow form) Out of facility DNR (pink MOST or yellow form) -  Does patient want to make changes to medical advance directive? No - Patient declined - - - - No - Patient declined -  Copy of [place] in Chart? No - copy requested - - - - - -  Would patient like information on creating a medical advance directive? - Yes (Inpatient - patient defers creating a medical advance directive at this time) Yes (Inpatient - patient requests chaplain consult to create a medical advance directive) - - - -  Pre-existing out of facility DNR order (yellow form or pink MOST form) - - - - Yellow form placed in chart (order not valid for inpatient use) Yellow form placed in chart (order not valid for inpatient use) -    Tobacco Social History   Tobacco Use  Smoking Status Never Smoker  Smokeless Tobacco Never Used     Counseling given: Not Answered   Clinical Intake:  Pre-visit preparation completed: Yes  Pain : No/denies pain     Nutritional Status: BMI of 19-24  Normal Diabetes: No     Barriers to Care Management & Learning:  Cognitive impairments(Dx of mild cognitive decline)  Do you feel unsafe in your current relationship?: No Do you feel physically threatened by others?: No Anyone hurting you at home, work, or school?: No Unable to ask?: No Information provided on Community resources: No  How often do you need to have someone help you when you read instructions, pamphlets, or other written materials from your doctor or pharmacy?: 4 - Often  Interpreter Needed?: No     Past Medical History:  Diagnosis Date  . Barrett's esophagus with esophagitis   . Chronic bronchitis ([place])   . GERD (gastroesophageal reflux disease)   . Glaucoma   . Hypercholesteremia   . Osteoarthritis   . Osteoporosis   . Sleep apnea    has CPAP  . TIA (transient ischemic attack)    Past Surgical History:  Procedure Laterality Date  . BUNIONECTOMY     left  . ROTATOR CUFF REPAIR     right   Family History  Problem Relation Age of Onset  . Breast cancer Sister   . Heart disease Mother    Social History   Socioeconomic History  . Marital status: Single    Spouse name: None  . Number of children: None  . Years of education: None  . Highest education level: None  Social Needs  . Financial resource strain: None  .  Food insecurity - worry: None  . Food insecurity - inability: None  . Transportation needs - medical: None  . Transportation needs - non-medical: None  Occupational History  . None  Tobacco Use  . Smoking status: Never Smoker  . Smokeless tobacco: Never Used  Substance and Sexual Activity  . Alcohol use: Yes    Alcohol/week: 0.0 oz    Comment: wine/brandy occasional  . Drug use: No  . Sexual activity: No  Other Topics Concern  . None  Social History Narrative  . None    Outpatient Encounter Medications as of 07/04/2017  Medication Sig  . Aloe LIQD Take by mouth daily.   Marland Kitchen aspirin 81 MG tablet Take 81 mg by mouth daily.  Marland Kitchen atorvastatin (LIPITOR) 40 MG tablet Take 1 tablet (40 mg total) by  mouth daily at 6 PM.  . calcium carbonate (OS-CAL) 600 MG TABS Take 600 mg by mouth daily.  . clopidogrel (PLAVIX) 75 MG tablet Take 1 tablet (75 mg total) by mouth daily.  . Flaxseed, Linseed, (GROUND FLAX SEEDS PO) Take by mouth daily.   . Multiple Vitamins-Minerals (PRESERVISION AREDS 2 PO) Take by mouth 2 (two) times daily.  Marland Kitchen omeprazole (PRILOSEC) 20 MG capsule TAKE ONE CAPSULE TWICE A DAY BEFORE MEALS  . prednisoLONE acetate (PRED FORTE) 1 % ophthalmic suspension Place 1 drop into the left eye 2 (two) times daily.  . timolol (BETIMOL) 0.5 % ophthalmic solution Place 1 drop into the left eye 2 (two) times daily.    No facility-administered encounter medications on file as of 07/04/2017.     Activities of Daily Living In your present state of health, do you have any difficulty performing the following activities: 07/04/2017 03/16/2017  Hearing? Tempie Donning  Comment HOH.  Hearing aids, bilateral -  Vision? Y Y  Comment Glaucoma -  Difficulty concentrating or making decisions? Y N  Walking or climbing stairs? Y N  Comment Unsteady gait -  Dressing or bathing? Y N  Comment Self pace -  Doing errands, shopping? Tempie Donning  Preparing Food and eating ? Y -  Comment Receieves meals prepped by [employer] -  Using the Toilet? N -  In the past six months, have you accidently leaked urine? Y -  Comment Wears a daily pad -  Do you have problems with loss of bowel control? N -  Managing your Medications? N -  Comment Managed with a pill box -  Managing your Finances? Y -  Comment Automatically managed/set up through her bank. Assisted by family friend -  Housekeeping or managing your Housekeeping? Y -  Comment Housekeeping assists -  Some recent data might be hidden    Patient Care Team: Einar Pheasant, MD as PCP - General (Internal Medicine) Einar Pheasant, MD (Internal Medicine)    Assessment:    This is a routine wellness examination for Canna. The goal of the wellness visit is  to assist the patient how to close the gaps in care and create a preventative care plan for the patient.   The roster of all physicians providing medical care to patient is listed in the Snapshot section of the chart.  Taking calcium VIT D as appropriate/Osteoporosis reviewed.    Safety issues reviewed; Smoke and carbon monoxide detectors in the home. No firearms in the home.  Wears seatbelts when riding with others. Patient does wear sunscreen or protective clothing when in direct sunlight. No violence in the home.  Depression- PHQ  2 &9 complete.  No signs/symptoms or verbal communication regarding little pleasure in doing things, feeling down, depressed or hopeless. No changes in sleeping, energy, eating, concentrating.  No thoughts of self harm or harm towards others.  Time spent on this topic is 8 minutes.   Patient is alert, normal appearance, oriented to person/place/and time. Recall of 0/3 words. Displays appropriate judgement and can read correct time from watch face.   No new identified risk were noted.  No failures at ADL's or IADL's.  Ambulates with cane.  BMI- discussed the importance of a healthy diet, water intake and the benefits of aerobic exercise. Educational material provided.   24 hour diet recall: [employer] community program.  Daily fluid intake: 0 cups of caffeine,  cups of water  Dental- every 12 months.  Eye- Visual acuity not assessed per patient preference since they have regular follow up with the ophthalmologist.  Wears corrective lenses.  CPAP not in use.  Health maintenance gaps- closed.  Patient Concerns: Urine frequency with discomfort x2 days.  Deferred to PCP for follow up.  Order placed per verbal order. Will follow.  Exercise Activities and Dietary recommendations Current Exercise Habits: Home exercise routine, Type of exercise: walking, Time (Minutes): 40, Frequency (Times/Week): 7, Weekly Exercise (Minutes/Week): 280, Intensity:  Mild  Goals    . INCREASE WATER INTAKE      Fall Risk Fall Risk  07/04/2017 05/13/2016 04/21/2016 09/29/2015 12/11/2012  Falls in the past year? No No No No No  Risk for fall due to : - - - - -   Depression Screen PHQ 2/9 Scores 07/04/2017 04/11/2017 05/13/2016 04/21/2016  PHQ - 2 Score 0 0 0 0  PHQ- 9 Score 2 0 - -     Cognitive Function     6CIT Screen 07/04/2017 07/01/2016  What Year? 0 points 0 points  What month? 0 points 0 points  What time? 0 points 0 points  Count back from 20 0 points 2 points  Months in reverse 2 points 0 points    Immunization History  Administered Date(s) Administered  . Influenza Split 04/22/2014  . Influenza-Unspecified 05/07/2013, 05/15/2014, 04/27/2015, 05/01/2016  . Pneumococcal Conjugate-13 12/12/2013  . Pneumococcal Polysaccharide-23 05/28/2015   Screening Tests Health Maintenance  Topic Date Due  . MAMMOGRAM  08/13/2025 (Originally 11/16/2012)  . TETANUS/TDAP  10/21/2020  . INFLUENZA VACCINE  Completed  . DEXA SCAN  Completed  . PNA vac Low Risk Adult  Completed      Plan:    End of life planning; Advance aging; Advanced directives discussed. Copy of current HCPOA/Living Will requested.    I have personally reviewed and noted the following in the patient's chart:   . Medical and social history . Use of alcohol, tobacco or illicit drugs  . Current medications and supplements . Functional ability and status . Nutritional status . Physical activity . Advanced directives . List of other physicians . Hospitalizations, surgeries, and ER visits in previous 12 months . Vitals . Screenings to include cognitive, depression, and falls . Referrals and appointments  In addition, I have reviewed and discussed with patient certain preventive protocols, quality metrics, and best practice recommendations. A written personalized care plan for preventive services as well as general preventive health recommendations were provided to patient.      Varney Biles, LPN  77/10/1285   Reviewed above information.  Discussed pt concerns with team lead.  With increased frequency, otherwise doing ok.  Will check urine  and culture.  Offered if needed to be seen, could work in during lunch.  Urine dip negative.  Will await culture.  Agree with above assessment and plan.    Dr Nicki Reaper

## 2017-07-04 NOTE — Telephone Encounter (Signed)
Please advise since you saw her earlier. Thank you!

## 2017-07-04 NOTE — Patient Instructions (Addendum)
  Ms. Losada , Thank you for taking time to come for your Medicare Wellness Visit. I appreciate your ongoing commitment to your health goals. Please review the following plan we discussed and let me know if I can assist you in the future.   -MAKE A DAILY LIST OF  IMPORTANT THINGS TO DO  -GO TO THE SENIOR CENTER TO UPDATE LIVING WILL FORMS  These are the goals we discussed: Goals    . INCREASE WATER INTAKE       This is a list of the screening recommended for you and due dates:  Health Maintenance  Topic Date Due  . Mammogram  08/13/2025*  . Tetanus Vaccine  10/21/2020  . Flu Shot  Completed  . DEXA scan (bone density measurement)  Completed  . Pneumonia vaccines  Completed  *Topic was postponed. The date shown is not the original due date.

## 2017-07-06 LAB — URINE CULTURE
MICRO NUMBER: 81361398
SPECIMEN QUALITY:: ADEQUATE

## 2017-07-11 ENCOUNTER — Ambulatory Visit: Payer: Medicare Other | Admitting: Internal Medicine

## 2017-07-18 ENCOUNTER — Ambulatory Visit (INDEPENDENT_AMBULATORY_CARE_PROVIDER_SITE_OTHER): Payer: Medicare Other | Admitting: Internal Medicine

## 2017-07-18 ENCOUNTER — Encounter: Payer: Self-pay | Admitting: Internal Medicine

## 2017-07-18 DIAGNOSIS — H409 Unspecified glaucoma: Secondary | ICD-10-CM | POA: Diagnosis not present

## 2017-07-18 DIAGNOSIS — E7800 Pure hypercholesterolemia, unspecified: Secondary | ICD-10-CM

## 2017-07-18 DIAGNOSIS — K2270 Barrett's esophagus without dysplasia: Secondary | ICD-10-CM

## 2017-07-18 DIAGNOSIS — R531 Weakness: Secondary | ICD-10-CM

## 2017-07-18 DIAGNOSIS — G459 Transient cerebral ischemic attack, unspecified: Secondary | ICD-10-CM | POA: Diagnosis not present

## 2017-07-18 DIAGNOSIS — R413 Other amnesia: Secondary | ICD-10-CM | POA: Diagnosis not present

## 2017-07-18 DIAGNOSIS — K2100 Gastro-esophageal reflux disease with esophagitis, without bleeding: Secondary | ICD-10-CM

## 2017-07-18 DIAGNOSIS — I639 Cerebral infarction, unspecified: Secondary | ICD-10-CM

## 2017-07-18 NOTE — Progress Notes (Signed)
Patient ID: Christy Cisneros, female   DOB: 1919/08/30, 83 y.o.   MRN: [id number]   Subjective:    Patient ID: Christy Cisneros, female    DOB: [birth year], 83 y.o.   MRN: [id number]  HPI  Patient here for a scheduled follow up.  She is accompanied by a friend.  History obtained from both of them.  She was admitted 03/16/17 and diagnosed with TIA.  Had MRI of the brain and MRA which showed nothing acute.  Was started on aspirin, plavix and a statin.  She had physical therapy. She has not had a recurring episode since her discharge.  She did bring in various medication bottles.  Was questioning if she needed to take.  We went through each medication and her friend was going to discard the medications that were not needed.  We did discuss continuing on her omeprazole, plavix and cholesterol medication.  She tries to stay active.  No chest pain.  No sob.  No acid reflux reported.  No abdominal pain.  Bowels moving.  She did request continues physical therapy for strengthening. Went to the wound clinic.  Wound resolved.     Past Medical History:  Diagnosis Date  . Barrett's esophagus with esophagitis   . Chronic bronchitis ([place])   . GERD (gastroesophageal reflux disease)   . Glaucoma   . Hypercholesteremia   . Osteoarthritis   . Osteoporosis   . Sleep apnea    has CPAP  . TIA (transient ischemic attack)    Past Surgical History:  Procedure Laterality Date  . BUNIONECTOMY     left  . ROTATOR CUFF REPAIR     right   Family History  Problem Relation Age of Onset  . Breast cancer Sister   . Heart disease Mother    Social History   Socioeconomic History  . Marital status: Single    Spouse name: None  . Number of children: None  . Years of education: None  . Highest education level: None  Social Needs  . Financial resource strain: None  . Food insecurity - worry: None  . Food insecurity - inability: None  . Transportation needs - medical: None  . Transportation needs -  non-medical: None  Occupational History  . None  Tobacco Use  . Smoking status: Never Smoker  . Smokeless tobacco: Never Used  Substance and Sexual Activity  . Alcohol use: Yes    Alcohol/week: 0.0 oz    Comment: wine/brandy occasional  . Drug use: No  . Sexual activity: No  Other Topics Concern  . None  Social History Narrative  . None    Outpatient Encounter Medications as of 07/18/2017  Medication Sig  . Aloe LIQD Take by mouth daily.   Marland Kitchen aspirin 81 MG tablet Take 81 mg by mouth daily.  Marland Kitchen atorvastatin (LIPITOR) 40 MG tablet Take 1 tablet (40 mg total) by mouth daily at 6 PM.  . calcium carbonate (OS-CAL) 600 MG TABS Take 600 mg by mouth daily.  . clopidogrel (PLAVIX) 75 MG tablet Take 1 tablet (75 mg total) by mouth daily.  . Flaxseed, Linseed, (GROUND FLAX SEEDS PO) Take by mouth daily.   . Multiple Vitamins-Minerals (PRESERVISION AREDS 2 PO) Take by mouth 2 (two) times daily.  Marland Kitchen omeprazole (PRILOSEC) 20 MG capsule TAKE ONE CAPSULE TWICE A DAY BEFORE MEALS  . prednisoLONE acetate (PRED FORTE) 1 % ophthalmic suspension Place 1 drop into the left eye 2 (two) times daily.  . timolol (BETIMOL)  0.5 % ophthalmic solution Place 1 drop into the left eye 2 (two) times daily.    No facility-administered encounter medications on file as of 07/18/2017.     Review of Systems  Constitutional: Negative for appetite change and unexpected weight change.  HENT: Negative for congestion and sinus pressure.   Respiratory: Negative for cough, chest tightness and shortness of breath.   Cardiovascular: Negative for chest pain, palpitations and leg swelling.  Gastrointestinal: Negative for abdominal pain, diarrhea, nausea and vomiting.  Genitourinary: Negative for difficulty urinating and dysuria.  Musculoskeletal: Negative for joint swelling and myalgias.  Skin: Negative for color change and rash.  Neurological: Negative for dizziness, light-headedness and headaches.  Psychiatric/Behavioral:  Negative for agitation and dysphoric mood.       Objective:    Physical Exam  Constitutional: She appears well-developed and well-nourished. No distress.  HENT:  Nose: Nose normal.  Mouth/Throat: Oropharynx is clear and moist.  Neck: Neck supple. No thyromegaly present.  Cardiovascular: Normal rate and regular rhythm.  Pulmonary/Chest: Breath sounds normal. No respiratory distress. She has no wheezes.  Abdominal: Soft. Bowel sounds are normal. There is no tenderness.  Musculoskeletal: She exhibits no edema or tenderness.  Lymphadenopathy:    She has no cervical adenopathy.  Skin: No rash noted. No erythema.  Psychiatric: She has a normal mood and affect. Her behavior is normal.    BP 130/66   Pulse 75   Temp 97.7 F (36.5 C) (Oral)   Ht 4\' 11"  (1.499 m)   Wt 102 lb 9.6 oz (46.5 kg)   SpO2 98%   BMI 20.72 kg/m  Wt Readings from Last 3 Encounters:  07/18/17 102 lb 9.6 oz (46.5 kg)  07/04/17 101 lb 1.9 oz (45.9 kg)  04/11/17 100 lb 12.8 oz (45.7 kg)     Lab Results  Component Value Date   WBC 7.6 03/16/2017   HGB 13.7 03/16/2017   HCT 40.6 03/16/2017   PLT 276 03/16/2017   GLUCOSE 128 (H) 03/16/2017   CHOL 207 (H) 03/17/2017   TRIG 93 03/17/2017   HDL 72 03/17/2017   LDLCALC 116 (H) 03/17/2017   ALT 18 03/16/2017   AST 28 03/16/2017   NA 140 03/16/2017   K 4.3 03/16/2017   CL 109 03/16/2017   CREATININE 0.71 03/16/2017   BUN 21 (H) 03/16/2017   CO2 23 03/16/2017   TSH 1.544 09/15/2016   INR 0.96 03/16/2017   HGBA1C 5.4 03/17/2017    Mr Brain Wo Contrast  Result Date: 03/17/2017 CLINICAL DATA:  Blurry vision, follow-up pontine stroke. History of TIA, probable dementia. EXAM: MRI HEAD WITHOUT CONTRAST MRA HEAD WITHOUT CONTRAST TECHNIQUE: Multiplanar, multiecho pulse sequences of the brain and surrounding structures were obtained without intravenous contrast. Angiographic images of the head were obtained using MRA technique without contrast. COMPARISON:  CT  head March 16, 2017 at 1058 hours and CTA HEAD March 16, 2017 at 1207 hours and MRI head Dec 06, 2012 FINDINGS: MRI HEAD FINDINGS BRAIN: No reduced diffusion to suggest acute ischemia with particular attention to RIGHT pons. No susceptibility artifact to suggest hemorrhage. The ventricles and sulci are normal for patient's age. Old bilateral basal ganglia, RIGHT pontine lacunar infarct. Prominent perivascular spaces seen with chronic small vessel ischemic disease. Patchy to confluent supratentorial white matter T2 hyperintensities without midline shift, mass effect or masses. No abnormal extra-axial fluid collections. VASCULAR: Normal major intracranial vascular flow voids present at skull base. SKULL AND UPPER CERVICAL SPINE: No abnormal sellar expansion.  No suspicious calvarial bone marrow signal. Craniocervical junction maintained. SINUSES/ORBITS: Atretic RIGHT maxillary sinus with mucosal thickening compatible with chronic sinusitis. LEFT mastoid effusion. The included ocular globes and orbital contents are non-suspicious. Status post bilateral ocular lens implants. OTHER: None. MRA HEAD FINDINGS ANTERIOR CIRCULATION: Normal flow related enhancement of the included cervical, petrous, cavernous and supraclinoid internal carotid arteries. Patent anterior communicating artery. Patent anterior and middle cerebral arteries, including distal segments. Symmetric signal loss at trifurcation due to tortuosity. No large vessel occlusion, aneurysm. POSTERIOR CIRCULATION: LEFT vertebral artery is dominant. Basilar artery is patent, with normal flow related enhancement of the main branch vessels. Patent posterior cerebral arteries, symmetric signal loss associated with tortuosity, normal caliber. Robust RIGHT posterior communicating artery No large vessel occlusion, aneurysm. ANATOMIC VARIANTS: None. Source images and MIP images were reviewed. IMPRESSION: MRI HEAD: 1. No acute intracranial process. No acute infarct with  particular attention to RIGHT pons. 2. Numerous old lacunar lacunar infarcts. Moderate to severe chronic small vessel ischemic disease. MRA HEAD: 1. No emergent large vessel occlusion. Known stenoses better documented on today's CTA HEAD. Electronically Signed   By: Elon Alas M.D.   On: 03/17/2017 00:25   Mr Jodene Nam Head/brain YS Cm  Result Date: 03/17/2017 CLINICAL DATA:  Blurry vision, follow-up pontine stroke. History of TIA, probable dementia. EXAM: MRI HEAD WITHOUT CONTRAST MRA HEAD WITHOUT CONTRAST TECHNIQUE: Multiplanar, multiecho pulse sequences of the brain and surrounding structures were obtained without intravenous contrast. Angiographic images of the head were obtained using MRA technique without contrast. COMPARISON:  CT head March 16, 2017 at 1058 hours and CTA HEAD March 16, 2017 at 1207 hours and MRI head Dec 06, 2012 FINDINGS: MRI HEAD FINDINGS BRAIN: No reduced diffusion to suggest acute ischemia with particular attention to RIGHT pons. No susceptibility artifact to suggest hemorrhage. The ventricles and sulci are normal for patient's age. Old bilateral basal ganglia, RIGHT pontine lacunar infarct. Prominent perivascular spaces seen with chronic small vessel ischemic disease. Patchy to confluent supratentorial white matter T2 hyperintensities without midline shift, mass effect or masses. No abnormal extra-axial fluid collections. VASCULAR: Normal major intracranial vascular flow voids present at skull base. SKULL AND UPPER CERVICAL SPINE: No abnormal sellar expansion. No suspicious calvarial bone marrow signal. Craniocervical junction maintained. SINUSES/ORBITS: Atretic RIGHT maxillary sinus with mucosal thickening compatible with chronic sinusitis. LEFT mastoid effusion. The included ocular globes and orbital contents are non-suspicious. Status post bilateral ocular lens implants. OTHER: None. MRA HEAD FINDINGS ANTERIOR CIRCULATION: Normal flow related enhancement of the included  cervical, petrous, cavernous and supraclinoid internal carotid arteries. Patent anterior communicating artery. Patent anterior and middle cerebral arteries, including distal segments. Symmetric signal loss at trifurcation due to tortuosity. No large vessel occlusion, aneurysm. POSTERIOR CIRCULATION: LEFT vertebral artery is dominant. Basilar artery is patent, with normal flow related enhancement of the main branch vessels. Patent posterior cerebral arteries, symmetric signal loss associated with tortuosity, normal caliber. Robust RIGHT posterior communicating artery No large vessel occlusion, aneurysm. ANATOMIC VARIANTS: None. Source images and MIP images were reviewed. IMPRESSION: MRI HEAD: 1. No acute intracranial process. No acute infarct with particular attention to RIGHT pons. 2. Numerous old lacunar lacunar infarcts. Moderate to severe chronic small vessel ischemic disease. MRA HEAD: 1. No emergent large vessel occlusion. Known stenoses better documented on today's CTA HEAD. Electronically Signed   By: Elon Alas M.D.   On: 03/17/2017 00:25       Assessment & Plan:   Problem List Items Addressed This Visit  BARRETTS ESOPHAGUS    Continue prilosec.  Has declined f/u EGD.       GERD (gastroesophageal reflux disease)    Has previously declined f/u EGD.  Remains on prilosec. Currently controlled.       Glaucoma    Followed by ophthalmology.        HYPERCHOLESTEROLEMIA    On atorvastatin now.  Recent TIA.  Discussed her medication today.  Follow lipid panel and liver function tests.        Memory change    Seeing neurology.  Note reviewed.  25/30 on mini mental status exam.  No medication felt warranted.  Recommended f/u in 45months.        Transient cerebral ischemia    Recently admitted and diagnosed with TIA.  No reoccurrence.  plavix and statin medication added.  Discussed with her today - regarding taking the medication.  Follow.       Weakness    Had home physical  therapy.  Feels needs more therapy - strengthening, etc. Request referral to [facility] Physical therapy.        Relevant Orders   Ambulatory referral to Physical Therapy       Einar Pheasant, MD

## 2017-07-18 NOTE — Progress Notes (Signed)
Pre visit review using our clinic review tool, if applicable. No additional management support is needed unless otherwise documented below in the visit note. 

## 2017-07-21 ENCOUNTER — Encounter: Payer: Self-pay | Admitting: Internal Medicine

## 2017-07-21 NOTE — Assessment & Plan Note (Signed)
On atorvastatin now.  Recent TIA.  Discussed her medication today.  Follow lipid panel and liver function tests.

## 2017-07-21 NOTE — Assessment & Plan Note (Signed)
Continue prilosec.  Has declined f/u EGD.

## 2017-07-21 NOTE — Assessment & Plan Note (Signed)
Seeing neurology.  Note reviewed.  25/30 on mini mental status exam.  No medication felt warranted.  Recommended f/u in 90months.

## 2017-07-21 NOTE — Assessment & Plan Note (Signed)
Followed by ophthalmology.   

## 2017-07-21 NOTE — Assessment & Plan Note (Signed)
Had home physical therapy.  Feels needs more therapy - strengthening, etc. Request referral to [facility] Physical therapy.

## 2017-07-21 NOTE — Assessment & Plan Note (Signed)
Recently admitted and diagnosed with TIA.  No reoccurrence.  plavix and statin medication added.  Discussed with her today - regarding taking the medication.  Follow.

## 2017-07-21 NOTE — Assessment & Plan Note (Signed)
Has previously declined f/u EGD.  Remains on prilosec. Currently controlled.

## 2017-07-24 DIAGNOSIS — H401123 Primary open-angle glaucoma, left eye, severe stage: Secondary | ICD-10-CM | POA: Diagnosis not present

## 2017-07-24 DIAGNOSIS — H52221 Regular astigmatism, right eye: Secondary | ICD-10-CM | POA: Diagnosis not present

## 2017-07-24 DIAGNOSIS — H53121 Transient visual loss, right eye: Secondary | ICD-10-CM | POA: Diagnosis not present

## 2017-07-24 DIAGNOSIS — H04123 Dry eye syndrome of bilateral lacrimal glands: Secondary | ICD-10-CM | POA: Diagnosis not present

## 2017-07-24 DIAGNOSIS — H353131 Nonexudative age-related macular degeneration, bilateral, early dry stage: Secondary | ICD-10-CM | POA: Diagnosis not present

## 2017-07-24 DIAGNOSIS — H401113 Primary open-angle glaucoma, right eye, severe stage: Secondary | ICD-10-CM | POA: Diagnosis not present

## 2017-07-24 DIAGNOSIS — H1820 Unspecified corneal edema: Secondary | ICD-10-CM | POA: Diagnosis not present

## 2017-07-24 DIAGNOSIS — S0502XA Injury of conjunctiva and corneal abrasion without foreign body, left eye, initial encounter: Secondary | ICD-10-CM | POA: Diagnosis not present

## 2017-07-24 DIAGNOSIS — H524 Presbyopia: Secondary | ICD-10-CM | POA: Diagnosis not present

## 2017-07-24 DIAGNOSIS — H47233 Glaucomatous optic atrophy, bilateral: Secondary | ICD-10-CM | POA: Diagnosis not present

## 2017-07-24 DIAGNOSIS — H5211 Myopia, right eye: Secondary | ICD-10-CM | POA: Diagnosis not present

## 2017-07-24 DIAGNOSIS — H35363 Drusen (degenerative) of macula, bilateral: Secondary | ICD-10-CM | POA: Diagnosis not present

## 2017-07-31 DIAGNOSIS — H04123 Dry eye syndrome of bilateral lacrimal glands: Secondary | ICD-10-CM | POA: Diagnosis not present

## 2017-07-31 DIAGNOSIS — H47233 Glaucomatous optic atrophy, bilateral: Secondary | ICD-10-CM | POA: Diagnosis not present

## 2017-07-31 DIAGNOSIS — H401123 Primary open-angle glaucoma, left eye, severe stage: Secondary | ICD-10-CM | POA: Diagnosis not present

## 2017-07-31 DIAGNOSIS — H1820 Unspecified corneal edema: Secondary | ICD-10-CM | POA: Diagnosis not present

## 2017-07-31 DIAGNOSIS — H35363 Drusen (degenerative) of macula, bilateral: Secondary | ICD-10-CM | POA: Diagnosis not present

## 2017-07-31 DIAGNOSIS — H524 Presbyopia: Secondary | ICD-10-CM | POA: Diagnosis not present

## 2017-07-31 DIAGNOSIS — H5211 Myopia, right eye: Secondary | ICD-10-CM | POA: Diagnosis not present

## 2017-07-31 DIAGNOSIS — H401113 Primary open-angle glaucoma, right eye, severe stage: Secondary | ICD-10-CM | POA: Diagnosis not present

## 2017-07-31 DIAGNOSIS — H353131 Nonexudative age-related macular degeneration, bilateral, early dry stage: Secondary | ICD-10-CM | POA: Diagnosis not present

## 2017-07-31 DIAGNOSIS — H52221 Regular astigmatism, right eye: Secondary | ICD-10-CM | POA: Diagnosis not present

## 2017-07-31 DIAGNOSIS — S0502XD Injury of conjunctiva and corneal abrasion without foreign body, left eye, subsequent encounter: Secondary | ICD-10-CM | POA: Diagnosis not present

## 2017-07-31 DIAGNOSIS — H53121 Transient visual loss, right eye: Secondary | ICD-10-CM | POA: Diagnosis not present

## 2017-08-03 DIAGNOSIS — M6281 Muscle weakness (generalized): Secondary | ICD-10-CM | POA: Diagnosis not present

## 2017-08-07 ENCOUNTER — Ambulatory Visit (INDEPENDENT_AMBULATORY_CARE_PROVIDER_SITE_OTHER): Payer: Medicare Other | Admitting: Family Medicine

## 2017-08-07 ENCOUNTER — Encounter: Payer: Self-pay | Admitting: Family Medicine

## 2017-08-07 VITALS — BP 118/68 | HR 82 | Temp 97.5°F | Resp 16 | Ht 62.0 in | Wt 103.2 lb

## 2017-08-07 DIAGNOSIS — R300 Dysuria: Secondary | ICD-10-CM | POA: Diagnosis not present

## 2017-08-07 LAB — POCT URINALYSIS DIPSTICK
Bilirubin, UA: NEGATIVE
Glucose, UA: NEGATIVE
LEUKOCYTES UA: NEGATIVE
NITRITE UA: NEGATIVE
PROTEIN UA: 100
RBC UA: NEGATIVE
SPEC GRAV UA: 1.02 (ref 1.010–1.025)
Urobilinogen, UA: 0.2 E.U./dL
pH, UA: 5.5 (ref 5.0–8.0)

## 2017-08-07 LAB — URINALYSIS, MICROSCOPIC ONLY

## 2017-08-07 NOTE — Progress Notes (Signed)
Pre-visit discussion using our clinic review tool. No additional management support is needed unless otherwise documented below in the visit note.  

## 2017-08-07 NOTE — Progress Notes (Addendum)
Patient ID: DOUGLAS ROOKS, female   DOB: 07/03/1920, 84 y.o.   MRN: [id number]    PCP: Einar Pheasant, MD  Subjective:  Christy Cisneros is a 84 y.o. year old very pleasant female patient who presents to the clinic as a walk in to "check for a bladder infection." Urinary Tract symptoms: symptoms including mild dysuria that occurs intermittently. -started: "a few days ago", symptoms are not worsening -previous treatments: None -recent UTI one month ago; treated with amoxicillin that improved symptoms. No history of kidney stones She was evaluated for dysuria 04/11/17 and culture did not reveal a significant infection. On 07/04/17, she was evaluated for dysuria and culture was positive for infection. She was treated with amoxicillin 500 mg TID x 1 week and symptoms resolved.   ROS-denies fever, chills, sweats, N/V, flank pain, or blood in urine  Pertinent Past Medical History- TIA, OSA, and mild cognitive impairment,   Medications- reviewed  Current Outpatient Medications  Medication Sig Dispense Refill  . Aloe LIQD Take by mouth daily.     Marland Kitchen aspirin 81 MG tablet Take 81 mg by mouth daily.    Marland Kitchen atorvastatin (LIPITOR) 40 MG tablet Take 1 tablet (40 mg total) by mouth daily at 6 PM. 30 tablet 1  . calcium carbonate (OS-CAL) 600 MG TABS Take 600 mg by mouth daily.    . clopidogrel (PLAVIX) 75 MG tablet Take 1 tablet (75 mg total) by mouth daily. 30 tablet 2  . Flaxseed, Linseed, (GROUND FLAX SEEDS PO) Take by mouth daily.     . Multiple Vitamins-Minerals (PRESERVISION AREDS 2 PO) Take by mouth 2 (two) times daily.    Marland Kitchen omeprazole (PRILOSEC) 20 MG capsule TAKE ONE CAPSULE TWICE A DAY BEFORE MEALS 120 capsule 1  . prednisoLONE acetate (PRED FORTE) 1 % ophthalmic suspension Place 1 drop into the left eye 2 (two) times daily.    . timolol (BETIMOL) 0.5 % ophthalmic solution Place 1 drop into the left eye 2 (two) times daily.      No current facility-administered medications for this  visit.     Objective: BP 118/68 (BP Location: Left Arm, Patient Position: Sitting, Cuff Size: Normal)   Pulse 82   Temp (!) 97.5 F (36.4 C) (Oral)   Resp 16   Ht 5\' 2"  (1.575 m)   Wt 103 lb 4 oz (46.8 kg)   SpO2 97%   BMI 18.88 kg/m  Gen: NAD, resting comfortably HEENT: oropharynx clear and moist CV: RRR no murmurs rubs or gallops Lungs: CTAB no crackles, wheeze, rhonchi Abdomen: soft/nontender/nondistended/normal bowel sounds. No rebound or guarding.  No CVA tenderness.   Suprapubic tenderness not present Ext: no edema Skin: warm, dry, no rash Neuro: grossly normal, moves all extremities  Assessment/Plan:  Dysuria UA negative for leukocytes, blood, and nitrites. Will await culture results for further treatment as this symptom is noted as intermittently occurring. Color is dark yellow and slightly cloudy. Patient confirms that she drinks "very little water" and suspect that this is related. Vaginal irritation may also be a contributing factor.  Will send for culture as patient  has a history of these symptoms and prior infections and advised close follow up for any change in symptoms.  Advised patient to to follow up if she develops worsening symptoms, nausea, vomiting, back pain, or a fever >101, or back pain. Also advised her to force fluids. Patient voiced understanding and agreed with plan.   Finally, recommended close follow up and  we reviewed  reasons to return to care including if symptoms worsen or persist or new concerns arise- once again particularly fever, N/V, or flank pain.   Laurita Quint, FNP

## 2017-08-07 NOTE — Patient Instructions (Signed)
Urine does not indicate an infection today. Please drink plenty of  water, enough to keep your urine pale yellow or clear.  If symptoms do not improve, worsen or you develop new symptoms such as fever, nausea, vomiting, or back pain, please follow up with your provider for further evaluation and treatment.  Dysuria Dysuria is pain or discomfort while urinating. The pain or discomfort may be felt in the tube that carries urine out of the bladder (urethra) or in the surrounding tissue of the genitals. The pain may also be felt in the groin area, lower abdomen, and lower back. You may have to urinate frequently or have the sudden feeling that you have to urinate (urgency). Dysuria can affect both men and women, but is more common in women. Dysuria can be caused by many different things, including:  Urinary tract infection in women.  Infection of the kidney or bladder.  Kidney stones or bladder stones.  Certain sexually transmitted infections (STIs), such as chlamydia.  Dehydration.  Inflammation of the vagina.  Use of certain medicines.  Use of certain soaps or scented products that cause irritation.  Follow these instructions at home: Watch your dysuria for any changes. The following actions may help to reduce any discomfort you are feeling:  Drink enough fluid to keep your urine clear or pale yellow.  Empty your bladder often. Avoid holding urine for long periods of time.  After a bowel movement or urination, women should cleanse from front to back, using each tissue only once.  Empty your bladder after sexual intercourse.  Take medicines only as directed by your health care provider.  If you were prescribed an antibiotic medicine, finish it all even if you start to feel better.  Avoid caffeine, tea, and alcohol. They can irritate the bladder and make dysuria worse. In men, alcohol may irritate the prostate.  Keep all follow-up visits as directed by your health care provider.  This is important.  If you had any tests done to find the cause of dysuria, it is your responsibility to obtain your test results. Ask the lab or department performing the test when and how you will get your results. Talk with your health care provider if you have any questions about your results.  Contact a health care provider if:  You develop pain in your back or sides.  You have a fever.  You have nausea or vomiting.  You have blood in your urine.  You are not urinating as often as you usually do. Get help right away if:  You pain is severe and not relieved with medicines.  You are unable to hold down any fluids.  You or someone else notices a change in your mental function.  You have a rapid heartbeat at rest.  You have shaking or chills.  You feel extremely weak. This information is not intended to replace advice given to you by your health care provider. Make sure you discuss any questions you have with your health care provider. Document Released: 04/15/2004 Document Revised: 12/24/2015 Document Reviewed: 03/13/2014 Elsevier Interactive Patient Education  [employer].

## 2017-08-08 ENCOUNTER — Telehealth: Payer: Self-pay | Admitting: Internal Medicine

## 2017-08-08 NOTE — Telephone Encounter (Signed)
Left message for patient to return call back.  [facility] may inform and speak to patient.

## 2017-08-08 NOTE — Telephone Encounter (Signed)
Please advise. She was seen yesterday by Almyra Free for dysuria

## 2017-08-08 NOTE — Telephone Encounter (Signed)
Please advise 

## 2017-08-08 NOTE — Telephone Encounter (Signed)
It appears she got AZO.  Is this helping?  Confirm staying hydrated.  If helping, would continue AZO.  If not helping need to confirm what specific symptoms she is having.

## 2017-08-08 NOTE — Telephone Encounter (Signed)
Copied from [place] [phone]. Topic: Quick Communication - See Telephone Encounter >> Aug 08, 2017  9:51 AM [place], Laban Emperor wrote: CRM for notification. See Telephone encounter for: Pamala Hurry a friend of the pt would like to know if there was anything the provider can call in for the pt. States the pt called her around 5:30 am wanted to go to the ER b/c she was having pain/burning when she went to the bathroom. She did not go to the ER but went and got some AZO meds.  08/08/17.

## 2017-08-09 ENCOUNTER — Telehealth: Payer: Self-pay | Admitting: Internal Medicine

## 2017-08-09 DIAGNOSIS — N3090 Cystitis, unspecified without hematuria: Secondary | ICD-10-CM

## 2017-08-09 LAB — URINE CULTURE
MICRO NUMBER: 90021987
SPECIMEN QUALITY:: ADEQUATE

## 2017-08-09 NOTE — Telephone Encounter (Signed)
Attempted to contact pt regarding symptoms; Left message on voicemail [phone]; see CRM [phone].

## 2017-08-09 NOTE — Telephone Encounter (Signed)
Spoke with patient, states dysuria "better but still hurts." Reports urgency resolving but has had "a few accidents today." States she has taken the AZO three times yesterday and once today.

## 2017-08-10 ENCOUNTER — Ambulatory Visit: Payer: Self-pay

## 2017-08-10 MED ORDER — TRIMETHOPRIM 100 MG PO TABS: 100.0000 mg | ORAL_TABLET | Freq: Two times a day (BID) | ORAL | 0 refills | Status: DC

## 2017-08-10 NOTE — Telephone Encounter (Signed)
Urine culture indicates an infection. Please notify patient that an antibiotic has been sent to her pharmacy. Attempted to contact patient by phone 08/09/17 at 7:00 pm but was unable to reach her. Recommend drinking enough water to keep urine pale yellow or clear and follow up with PCP if symptoms do not improve with treatment, worsen, or new symptoms develop of nausea, vomiting, back pain, or fever. Close follow up is advised if symptoms do not improve with treatment.   Culture indicated resistance to amoxil and choices of antibiotics were limited due to allergies and risk factors associated with older adults.

## 2017-08-10 NOTE — Telephone Encounter (Signed)
Left message for pt to return call regarding lab results.  

## 2017-08-10 NOTE — Telephone Encounter (Signed)
Pt's friend called to see if results were back and if it was positive were the abx called in. Pt called Pamala Hurry to help her. No lab results were given but Pamala Hurry was informed that abx was called in.  Pamala Hurry asked if pt still needs to keep appt for tomorrow.

## 2017-08-10 NOTE — Telephone Encounter (Signed)
Patient was informed of results.  Patient understood and no questions, comments, or concerns at this time. Informed patient to keep appointment due to still having SX.  Use as a follow up.

## 2017-08-11 ENCOUNTER — Telehealth: Payer: Self-pay

## 2017-08-11 ENCOUNTER — Ambulatory Visit: Payer: Medicare Other | Admitting: Internal Medicine

## 2017-08-11 DIAGNOSIS — Z0289 Encounter for other administrative examinations: Secondary | ICD-10-CM

## 2017-08-11 NOTE — Telephone Encounter (Signed)
Tried to call pt again

## 2017-08-11 NOTE — Telephone Encounter (Signed)
Lm for pt to call back and talk to me to schedule her an appt

## 2017-08-11 NOTE — Telephone Encounter (Signed)
Copied from [place] [id number]. Topic: Inquiry >> Aug 11, 2017 10:46 AM Corie Chiquito, [place] wrote: Reason for CRM: Patient had to cancel her appointment due to lack of a ride. The nurse and I here have tried to call the office to see if she could be fit into the schedule for Monday because it is full that day as well.Patient refused the Saturday Clinic and is adamant about being seen on Monday. If someone from the office could try to fit her in and give her a call back at [phone]. Nurse Larene Beach advised patient to go to urgent care but she refused that aslso

## 2017-08-15 DIAGNOSIS — D1801 Hemangioma of skin and subcutaneous tissue: Secondary | ICD-10-CM | POA: Diagnosis not present

## 2017-08-15 DIAGNOSIS — L821 Other seborrheic keratosis: Secondary | ICD-10-CM | POA: Diagnosis not present

## 2017-08-15 DIAGNOSIS — L570 Actinic keratosis: Secondary | ICD-10-CM | POA: Diagnosis not present

## 2017-08-15 DIAGNOSIS — X32XXXA Exposure to sunlight, initial encounter: Secondary | ICD-10-CM | POA: Diagnosis not present

## 2017-08-16 DIAGNOSIS — H903 Sensorineural hearing loss, bilateral: Secondary | ICD-10-CM | POA: Diagnosis not present

## 2017-08-16 DIAGNOSIS — H6123 Impacted cerumen, bilateral: Secondary | ICD-10-CM | POA: Diagnosis not present

## 2017-08-17 ENCOUNTER — Ambulatory Visit: Payer: Medicare Other | Admitting: Family Medicine

## 2017-08-18 DIAGNOSIS — H353131 Nonexudative age-related macular degeneration, bilateral, early dry stage: Secondary | ICD-10-CM | POA: Diagnosis not present

## 2017-08-18 DIAGNOSIS — H52221 Regular astigmatism, right eye: Secondary | ICD-10-CM | POA: Diagnosis not present

## 2017-08-18 DIAGNOSIS — H47233 Glaucomatous optic atrophy, bilateral: Secondary | ICD-10-CM | POA: Diagnosis not present

## 2017-08-18 DIAGNOSIS — G43B0 Ophthalmoplegic migraine, not intractable: Secondary | ICD-10-CM | POA: Diagnosis not present

## 2017-08-18 DIAGNOSIS — H1820 Unspecified corneal edema: Secondary | ICD-10-CM | POA: Diagnosis not present

## 2017-08-18 DIAGNOSIS — H5211 Myopia, right eye: Secondary | ICD-10-CM | POA: Diagnosis not present

## 2017-08-18 DIAGNOSIS — H04123 Dry eye syndrome of bilateral lacrimal glands: Secondary | ICD-10-CM | POA: Diagnosis not present

## 2017-08-18 DIAGNOSIS — H53121 Transient visual loss, right eye: Secondary | ICD-10-CM | POA: Diagnosis not present

## 2017-08-18 DIAGNOSIS — H35363 Drusen (degenerative) of macula, bilateral: Secondary | ICD-10-CM | POA: Diagnosis not present

## 2017-08-18 DIAGNOSIS — H524 Presbyopia: Secondary | ICD-10-CM | POA: Diagnosis not present

## 2017-08-18 DIAGNOSIS — H401113 Primary open-angle glaucoma, right eye, severe stage: Secondary | ICD-10-CM | POA: Diagnosis not present

## 2017-08-18 DIAGNOSIS — H401123 Primary open-angle glaucoma, left eye, severe stage: Secondary | ICD-10-CM | POA: Diagnosis not present

## 2017-08-19 IMAGING — DX DG CHEST 2V
2 series · 2 of 2 positions shown · non-contrast
Comparison: 06/16/2015

CLINICAL DATA: Cough for 2 weeks.

EXAM:
CHEST  2 VIEW

[chest pa]
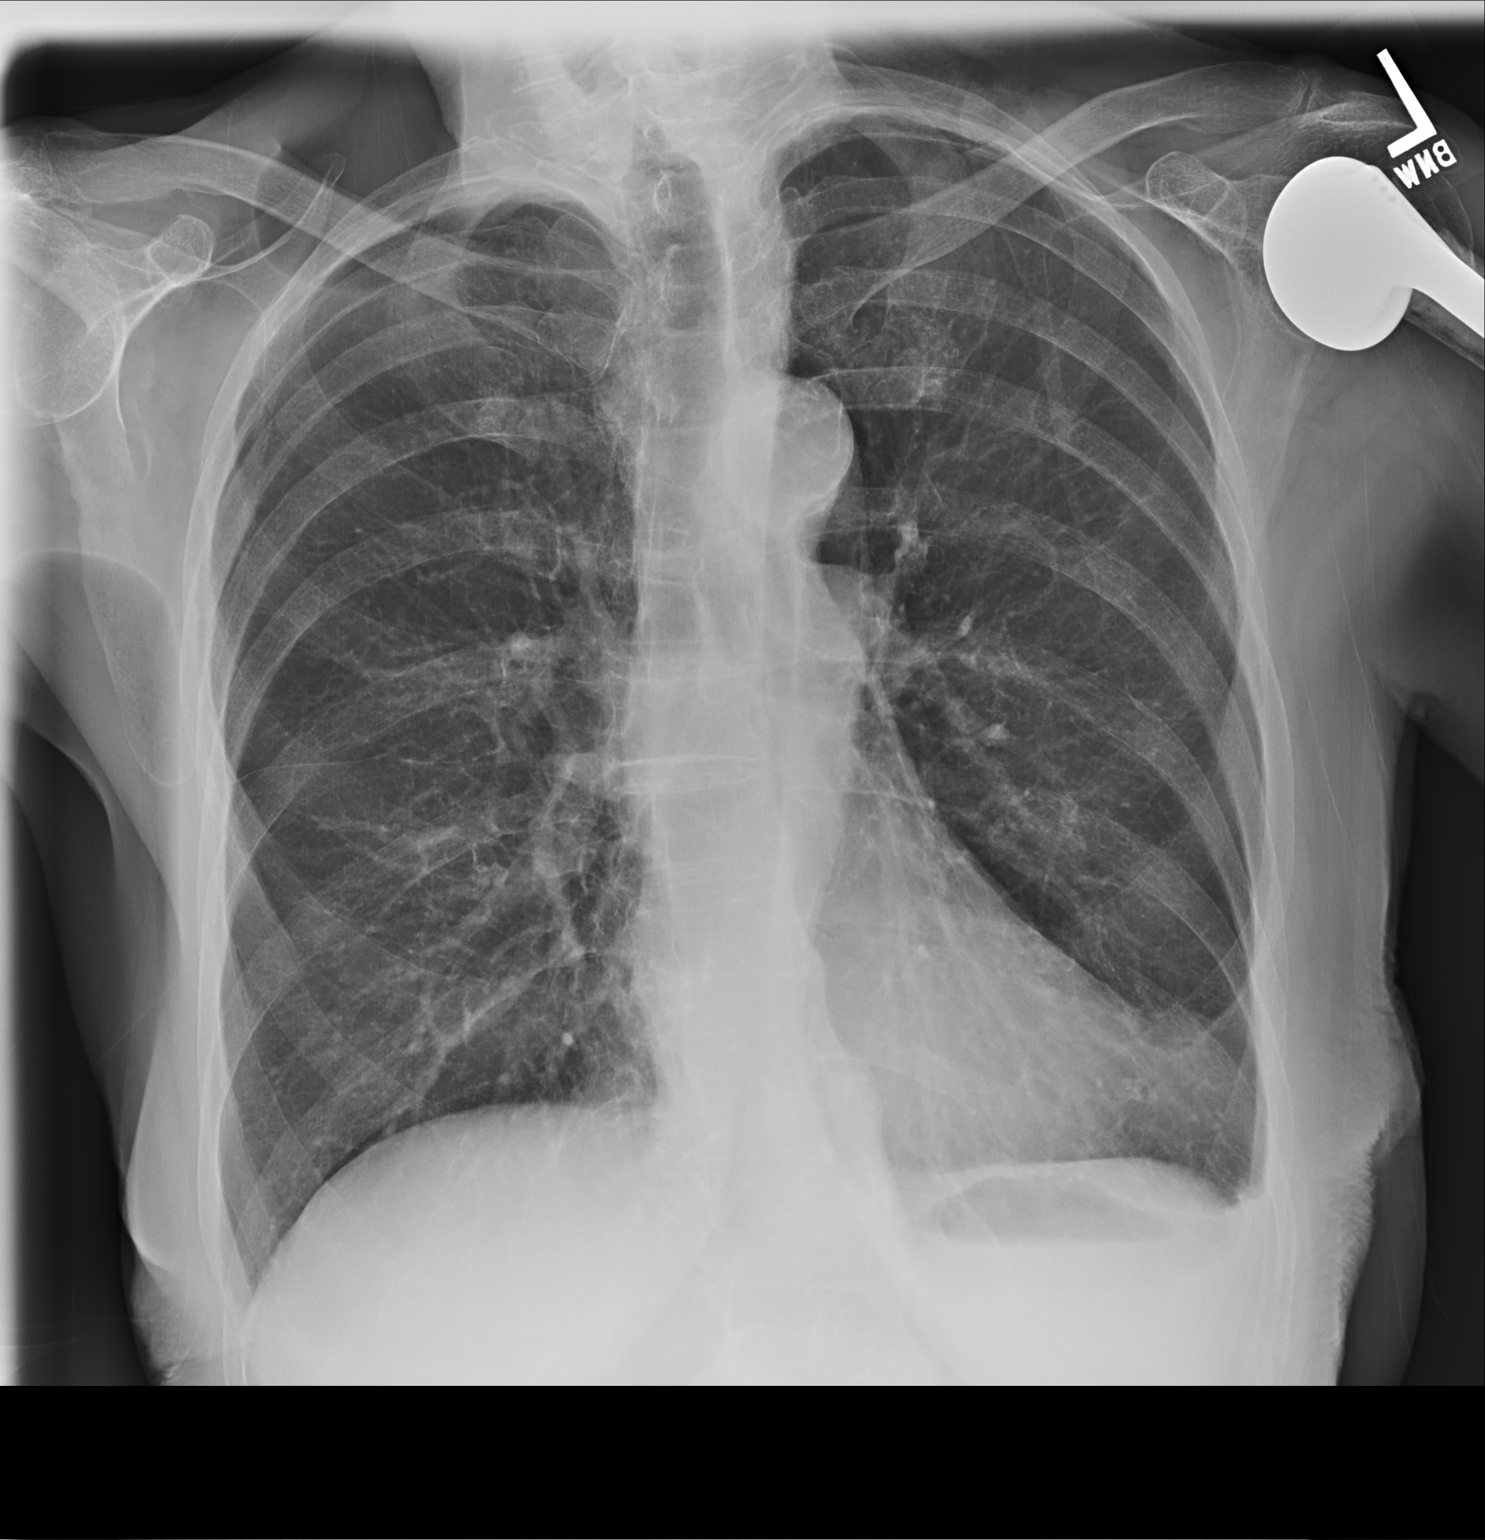

[chest lat]
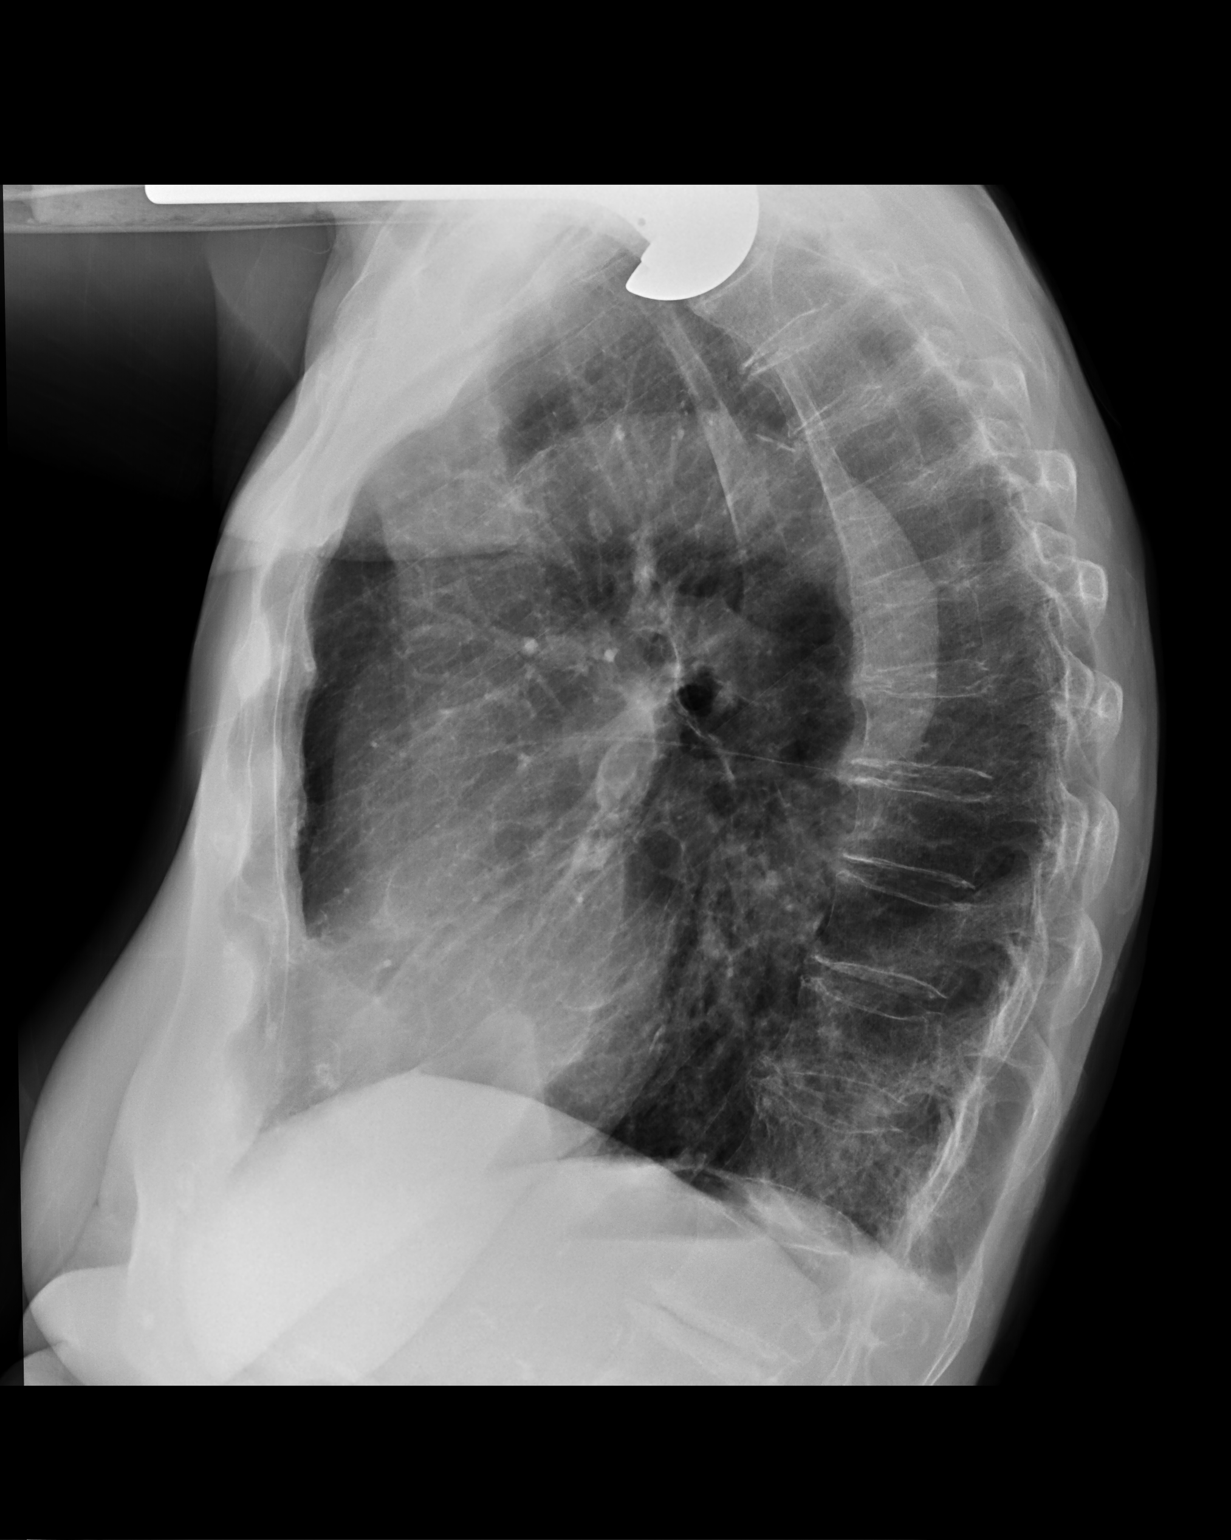

[2 of 2 positions shown; findings below may reference images not displayed]

FINDINGS: Lungs are hyperlucent and probably represents hyperinflation.
Findings are similar to the prior examination. Patient has had a
left shoulder arthroplasty. There may be increased densities at the
left lung base in the retrocardiac space. Otherwise, the lungs are
clear. No pulmonary edema. Heart and mediastinum are within normal
limits.
IMPRESSION: Subtle densities at the left lung base. Findings could represent a
small focus of infection.

## 2017-08-30 ENCOUNTER — Ambulatory Visit: Payer: Medicare Other | Admitting: Internal Medicine

## 2017-08-30 ENCOUNTER — Ambulatory Visit (INDEPENDENT_AMBULATORY_CARE_PROVIDER_SITE_OTHER): Payer: Medicare Other

## 2017-08-30 ENCOUNTER — Ambulatory Visit (INDEPENDENT_AMBULATORY_CARE_PROVIDER_SITE_OTHER): Payer: Medicare Other | Admitting: Internal Medicine

## 2017-08-30 VITALS — BP 128/70 | HR 84 | Temp 98.3°F | Resp 16 | Wt 105.0 lb

## 2017-08-30 DIAGNOSIS — O26892 Other specified pregnancy related conditions, second trimester: Secondary | ICD-10-CM

## 2017-08-30 DIAGNOSIS — K2270 Barrett's esophagus without dysplasia: Secondary | ICD-10-CM | POA: Diagnosis not present

## 2017-08-30 DIAGNOSIS — Z8673 Personal history of transient ischemic attack (TIA), and cerebral infarction without residual deficits: Secondary | ICD-10-CM

## 2017-08-30 DIAGNOSIS — R252 Cramp and spasm: Secondary | ICD-10-CM

## 2017-08-30 DIAGNOSIS — M5136 Other intervertebral disc degeneration, lumbar region: Secondary | ICD-10-CM | POA: Diagnosis not present

## 2017-08-30 DIAGNOSIS — M545 Low back pain, unspecified: Secondary | ICD-10-CM

## 2017-08-30 DIAGNOSIS — E7800 Pure hypercholesterolemia, unspecified: Secondary | ICD-10-CM

## 2017-08-30 DIAGNOSIS — R300 Dysuria: Secondary | ICD-10-CM

## 2017-08-30 DIAGNOSIS — G3184 Mild cognitive impairment, so stated: Secondary | ICD-10-CM | POA: Diagnosis not present

## 2017-08-30 DIAGNOSIS — K2100 Gastro-esophageal reflux disease with esophagitis, without bleeding: Secondary | ICD-10-CM

## 2017-08-30 LAB — CBC WITH DIFFERENTIAL/PLATELET
BASOS PCT: 1.2 % (ref 0.0–3.0)
Basophils Absolute: 0.1 10*3/uL (ref 0.0–0.1)
Eosinophils Absolute: 0.1 10*3/uL (ref 0.0–0.7)
Eosinophils Relative: 1.5 % (ref 0.0–5.0)
HCT: 41.1 % (ref 36.0–46.0)
Hemoglobin: 13.5 g/dL (ref 12.0–15.0)
LYMPHS ABS: 1.6 10*3/uL (ref 0.7–4.0)
Lymphocytes Relative: 18.6 % (ref 12.0–46.0)
MCHC: 32.9 g/dL (ref 30.0–36.0)
MCV: 92.2 fl (ref 78.0–100.0)
MONO ABS: 0.7 10*3/uL (ref 0.1–1.0)
Monocytes Relative: 8.5 % (ref 3.0–12.0)
NEUTROS ABS: 5.9 10*3/uL (ref 1.4–7.7)
NEUTROS PCT: 70.2 % (ref 43.0–77.0)
PLATELETS: 281 10*3/uL (ref 150.0–400.0)
RBC: 4.46 Mil/uL (ref 3.87–5.11)
RDW: 13.8 % (ref 11.5–15.5)
WBC: 8.4 10*3/uL (ref 4.0–10.5)

## 2017-08-30 LAB — URINALYSIS, ROUTINE W REFLEX MICROSCOPIC
BILIRUBIN URINE: NEGATIVE
Hgb urine dipstick: NEGATIVE
LEUKOCYTES UA: NEGATIVE
NITRITE: NEGATIVE
PH: 6 (ref 5.0–8.0)
RBC / HPF: NONE SEEN (ref 0–?)
SPECIFIC GRAVITY, URINE: 1.025 (ref 1.000–1.030)
Total Protein, Urine: 30 — AB
URINE GLUCOSE: NEGATIVE
UROBILINOGEN UA: 0.2 (ref 0.0–1.0)

## 2017-08-30 LAB — COMPREHENSIVE METABOLIC PANEL
ALT: 14 U/L (ref 0–35)
AST: 18 U/L (ref 0–37)
Albumin: 4.2 g/dL (ref 3.5–5.2)
Alkaline Phosphatase: 48 U/L (ref 39–117)
BUN: 23 mg/dL (ref 6–23)
CHLORIDE: 103 meq/L (ref 96–112)
CO2: 31 mEq/L (ref 19–32)
Calcium: 9.5 mg/dL (ref 8.4–10.5)
Creatinine, Ser: 0.82 mg/dL (ref 0.40–1.20)
GFR: 68.8 mL/min (ref >60.00)
GLUCOSE: 87 mg/dL (ref 70–99)
POTASSIUM: 4.5 meq/L (ref 3.5–5.1)
SODIUM: 139 meq/L (ref 135–145)
Total Bilirubin: 0.4 mg/dL (ref 0.2–1.2)
Total Protein: 7 g/dL (ref 6.0–8.3)

## 2017-08-30 LAB — TSH: TSH: 1.47 u[IU]/mL (ref 0.35–4.50)

## 2017-08-30 NOTE — Progress Notes (Signed)
Patient ID: Christy Cisneros, female   DOB: [birth year], 84 y.o.   MRN: [id number]   Subjective:    Patient ID: Christy Cisneros, female    DOB: 09-20-1919, 84 y.o.   MRN: [id number]  HPI  Patient here for a scheduled follow up.  She reports she is doing relatively well.  Stays active.  No chest pain.  No sob.  No acid reflux reported.  No abdominal pain.  Bowels moving.  Noticed some increased back pain previously.  States "pain in her spine".  No increased pain now.  Would like urine checked.  Discussed assisted living.  Discussed help in the home.  She is agreeable to [facility] program.     Past Medical History:  Diagnosis Date  . Barrett's esophagus with esophagitis   . Chronic bronchitis ([place])   . GERD (gastroesophageal reflux disease)   . Glaucoma   . Hypercholesteremia   . Osteoarthritis   . Osteoporosis   . Sleep apnea    has CPAP  . TIA (transient ischemic attack)    Past Surgical History:  Procedure Laterality Date  . BUNIONECTOMY     left  . ROTATOR CUFF REPAIR     right   Family History  Problem Relation Age of Onset  . Breast cancer Sister   . Heart disease Mother    Social History   Socioeconomic History  . Marital status: Single    Spouse name: None  . Number of children: None  . Years of education: None  . Highest education level: None  Social Needs  . Financial resource strain: None  . Food insecurity - worry: None  . Food insecurity - inability: None  . Transportation needs - medical: None  . Transportation needs - non-medical: None  Occupational History  . None  Tobacco Use  . Smoking status: Never Smoker  . Smokeless tobacco: Never Used  Substance and Sexual Activity  . Alcohol use: Yes    Alcohol/week: 0.0 oz    Comment: wine/brandy occasional  . Drug use: No  . Sexual activity: No  Other Topics Concern  . None  Social History Narrative  . None    Outpatient Encounter Medications as of 08/30/2017  Medication Sig  . Aloe LIQD Take  by mouth daily.   Marland Kitchen aspirin 81 MG tablet Take 81 mg by mouth daily.  Marland Kitchen atorvastatin (LIPITOR) 40 MG tablet Take 1 tablet (40 mg total) by mouth daily at 6 PM.  . calcium carbonate (OS-CAL) 600 MG TABS Take 600 mg by mouth daily.  . clopidogrel (PLAVIX) 75 MG tablet Take 1 tablet (75 mg total) by mouth daily.  . Flaxseed, Linseed, (GROUND FLAX SEEDS PO) Take by mouth daily.   . Multiple Vitamins-Minerals (PRESERVISION AREDS 2 PO) Take by mouth 2 (two) times daily.  Marland Kitchen omeprazole (PRILOSEC) 20 MG capsule TAKE ONE CAPSULE TWICE A DAY BEFORE MEALS  . prednisoLONE acetate (PRED FORTE) 1 % ophthalmic suspension Place 1 drop into the left eye 2 (two) times daily.  . timolol (BETIMOL) 0.5 % ophthalmic solution Place 1 drop into the left eye 2 (two) times daily.   Marland Kitchen trimethoprim (TRIMPEX) 100 MG tablet Take 1 tablet (100 mg total) by mouth 2 (two) times daily.   No facility-administered encounter medications on file as of 08/30/2017.     Review of Systems  Constitutional: Negative for appetite change and unexpected weight change.  HENT: Negative for congestion and sinus pressure.   Respiratory: Negative for cough, chest  tightness and shortness of breath.   Cardiovascular: Negative for chest pain, palpitations and leg swelling.  Gastrointestinal: Negative for abdominal pain, diarrhea, nausea and vomiting.  Genitourinary: Negative for difficulty urinating and vaginal discharge.  Musculoskeletal: Positive for back pain. Negative for joint swelling and myalgias.  Skin: Negative for color change and rash.  Neurological: Negative for dizziness, light-headedness and headaches.  Psychiatric/Behavioral: Negative for agitation and dysphoric mood.       Objective:    Physical Exam  Constitutional: She appears well-developed and well-nourished. No distress.  HENT:  Nose: Nose normal.  Mouth/Throat: Oropharynx is clear and moist.  Neck: Neck supple. No thyromegaly present.  Cardiovascular: Normal rate  and regular rhythm.  Pulmonary/Chest: Breath sounds normal. No respiratory distress. She has no wheezes.  Abdominal: Soft. Bowel sounds are normal. There is no tenderness.  Musculoskeletal: She exhibits no edema or tenderness.  Lymphadenopathy:    She has no cervical adenopathy.  Skin: No rash noted. No erythema.  Psychiatric: She has a normal mood and affect. Her behavior is normal.    BP 128/70 (BP Location: Left Arm, Patient Position: Sitting, Cuff Size: Normal)   Pulse 84   Temp 98.3 F (36.8 C) (Oral)   Resp 16   Wt 105 lb (47.6 kg)   SpO2 97%   BMI 19.20 kg/m  Wt Readings from Last 3 Encounters:  08/30/17 105 lb (47.6 kg)  08/07/17 103 lb 4 oz (46.8 kg)  07/18/17 102 lb 9.6 oz (46.5 kg)     Lab Results  Component Value Date   WBC 8.4 08/30/2017   HGB 13.5 08/30/2017   HCT 41.1 08/30/2017   PLT 281.0 08/30/2017   GLUCOSE 87 08/30/2017   CHOL 207 (H) 03/17/2017   TRIG 93 03/17/2017   HDL 72 03/17/2017   LDLCALC 116 (H) 03/17/2017   ALT 14 08/30/2017   AST 18 08/30/2017   NA 139 08/30/2017   K 4.5 08/30/2017   CL 103 08/30/2017   CREATININE 0.82 08/30/2017   BUN 23 08/30/2017   CO2 31 08/30/2017   TSH 1.47 08/30/2017   INR 0.96 03/16/2017   HGBA1C 5.4 03/17/2017    Mr Brain Wo Contrast  Result Date: 03/17/2017 CLINICAL DATA:  Blurry vision, follow-up pontine stroke. History of TIA, probable dementia. EXAM: MRI HEAD WITHOUT CONTRAST MRA HEAD WITHOUT CONTRAST TECHNIQUE: Multiplanar, multiecho pulse sequences of the brain and surrounding structures were obtained without intravenous contrast. Angiographic images of the head were obtained using MRA technique without contrast. COMPARISON:  CT head March 16, 2017 at 1058 hours and CTA HEAD March 16, 2017 at 1207 hours and MRI head Dec 06, 2012 FINDINGS: MRI HEAD FINDINGS BRAIN: No reduced diffusion to suggest acute ischemia with particular attention to RIGHT pons. No susceptibility artifact to suggest hemorrhage. The  ventricles and sulci are normal for patient's age. Old bilateral basal ganglia, RIGHT pontine lacunar infarct. Prominent perivascular spaces seen with chronic small vessel ischemic disease. Patchy to confluent supratentorial white matter T2 hyperintensities without midline shift, mass effect or masses. No abnormal extra-axial fluid collections. VASCULAR: Normal major intracranial vascular flow voids present at skull base. SKULL AND UPPER CERVICAL SPINE: No abnormal sellar expansion. No suspicious calvarial bone marrow signal. Craniocervical junction maintained. SINUSES/ORBITS: Atretic RIGHT maxillary sinus with mucosal thickening compatible with chronic sinusitis. LEFT mastoid effusion. The included ocular globes and orbital contents are non-suspicious. Status post bilateral ocular lens implants. OTHER: None. MRA HEAD FINDINGS ANTERIOR CIRCULATION: Normal flow related enhancement of the included  cervical, petrous, cavernous and supraclinoid internal carotid arteries. Patent anterior communicating artery. Patent anterior and middle cerebral arteries, including distal segments. Symmetric signal loss at trifurcation due to tortuosity. No large vessel occlusion, aneurysm. POSTERIOR CIRCULATION: LEFT vertebral artery is dominant. Basilar artery is patent, with normal flow related enhancement of the main branch vessels. Patent posterior cerebral arteries, symmetric signal loss associated with tortuosity, normal caliber. Robust RIGHT posterior communicating artery No large vessel occlusion, aneurysm. ANATOMIC VARIANTS: None. Source images and MIP images were reviewed. IMPRESSION: MRI HEAD: 1. No acute intracranial process. No acute infarct with particular attention to RIGHT pons. 2. Numerous old lacunar lacunar infarcts. Moderate to severe chronic small vessel ischemic disease. MRA HEAD: 1. No emergent large vessel occlusion. Known stenoses better documented on today's CTA HEAD. Electronically Signed   By: Elon Alas M.D.   On: 03/17/2017 00:25   Mr Jodene Nam Head/brain AY Cm  Result Date: 03/17/2017 CLINICAL DATA:  Blurry vision, follow-up pontine stroke. History of TIA, probable dementia. EXAM: MRI HEAD WITHOUT CONTRAST MRA HEAD WITHOUT CONTRAST TECHNIQUE: Multiplanar, multiecho pulse sequences of the brain and surrounding structures were obtained without intravenous contrast. Angiographic images of the head were obtained using MRA technique without contrast. COMPARISON:  CT head March 16, 2017 at 1058 hours and CTA HEAD March 16, 2017 at 1207 hours and MRI head Dec 06, 2012 FINDINGS: MRI HEAD FINDINGS BRAIN: No reduced diffusion to suggest acute ischemia with particular attention to RIGHT pons. No susceptibility artifact to suggest hemorrhage. The ventricles and sulci are normal for patient's age. Old bilateral basal ganglia, RIGHT pontine lacunar infarct. Prominent perivascular spaces seen with chronic small vessel ischemic disease. Patchy to confluent supratentorial white matter T2 hyperintensities without midline shift, mass effect or masses. No abnormal extra-axial fluid collections. VASCULAR: Normal major intracranial vascular flow voids present at skull base. SKULL AND UPPER CERVICAL SPINE: No abnormal sellar expansion. No suspicious calvarial bone marrow signal. Craniocervical junction maintained. SINUSES/ORBITS: Atretic RIGHT maxillary sinus with mucosal thickening compatible with chronic sinusitis. LEFT mastoid effusion. The included ocular globes and orbital contents are non-suspicious. Status post bilateral ocular lens implants. OTHER: None. MRA HEAD FINDINGS ANTERIOR CIRCULATION: Normal flow related enhancement of the included cervical, petrous, cavernous and supraclinoid internal carotid arteries. Patent anterior communicating artery. Patent anterior and middle cerebral arteries, including distal segments. Symmetric signal loss at trifurcation due to tortuosity. No large vessel occlusion, aneurysm.  POSTERIOR CIRCULATION: LEFT vertebral artery is dominant. Basilar artery is patent, with normal flow related enhancement of the main branch vessels. Patent posterior cerebral arteries, symmetric signal loss associated with tortuosity, normal caliber. Robust RIGHT posterior communicating artery No large vessel occlusion, aneurysm. ANATOMIC VARIANTS: None. Source images and MIP images were reviewed. IMPRESSION: MRI HEAD: 1. No acute intracranial process. No acute infarct with particular attention to RIGHT pons. 2. Numerous old lacunar lacunar infarcts. Moderate to severe chronic small vessel ischemic disease. MRA HEAD: 1. No emergent large vessel occlusion. Known stenoses better documented on today's CTA HEAD. Electronically Signed   By: Elon Alas M.D.   On: 03/17/2017 00:25       Assessment & Plan:   Problem List Items Addressed This Visit    BARRETTS ESOPHAGUS    Continue prilosec.  No upper symptoms.        GERD (gastroesophageal reflux disease)    No symptoms reported.  Follow.       History of CVA (cerebrovascular accident)    Continue plavix.  Discussed with her today. Continue  atorvastatin.       HYPERCHOLESTEROLEMIA    On lipitor.  Follow lipid panel and liver function tests.        Relevant Orders   Comprehensive metabolic panel (Completed)   TSH (Completed)   Mild cognitive impairment    Saw neurology.  Note reviewed.  Discussed assisted living.  Also discussed help at home.  She is in agreement.        Muscle cramps    Other Visit Diagnoses    Low back pain without sciatica, unspecified back pain laterality, unspecified chronicity    -  Primary   Back pain as outlined.  Better now.  She request urine check and xray.  Tylenol as needed.     Relevant Orders   DG Lumbar Spine 2-3 Views (Completed)   CBC with Differential/Platelet (Completed)   Dysuria during pregnancy, second trimester       Relevant Orders   Urinalysis, Routine w reflex microscopic (Completed)     Urine Culture (Completed)       Einar Pheasant, MD

## 2017-08-31 LAB — URINE CULTURE
MICRO NUMBER:: 90128148
RESULT: NO GROWTH
SPECIMEN QUALITY:: ADEQUATE

## 2017-09-02 ENCOUNTER — Encounter: Payer: Self-pay | Admitting: Internal Medicine

## 2017-09-02 NOTE — Assessment & Plan Note (Signed)
Continue prilosec.  No upper symptoms.   

## 2017-09-02 NOTE — Assessment & Plan Note (Signed)
Continue plavix.  Discussed with her today. Continue atorvastatin.

## 2017-09-02 NOTE — Assessment & Plan Note (Signed)
Saw neurology.  Note reviewed.  Discussed assisted living.  Also discussed help at home.  She is in agreement.

## 2017-09-02 NOTE — Assessment & Plan Note (Signed)
No symptoms reported.  Follow.  

## 2017-09-02 NOTE — Assessment & Plan Note (Signed)
On lipitor.  Follow lipid panel and liver function tests.   

## 2017-09-07 DIAGNOSIS — Z947 Corneal transplant status: Secondary | ICD-10-CM | POA: Diagnosis not present

## 2017-09-11 DIAGNOSIS — H903 Sensorineural hearing loss, bilateral: Secondary | ICD-10-CM | POA: Diagnosis not present

## 2017-09-25 DIAGNOSIS — H1820 Unspecified corneal edema: Secondary | ICD-10-CM | POA: Diagnosis not present

## 2017-09-25 DIAGNOSIS — H401113 Primary open-angle glaucoma, right eye, severe stage: Secondary | ICD-10-CM | POA: Diagnosis not present

## 2017-09-25 DIAGNOSIS — G43B0 Ophthalmoplegic migraine, not intractable: Secondary | ICD-10-CM | POA: Diagnosis not present

## 2017-09-25 DIAGNOSIS — H53121 Transient visual loss, right eye: Secondary | ICD-10-CM | POA: Diagnosis not present

## 2017-09-25 DIAGNOSIS — H52221 Regular astigmatism, right eye: Secondary | ICD-10-CM | POA: Diagnosis not present

## 2017-09-25 DIAGNOSIS — H47233 Glaucomatous optic atrophy, bilateral: Secondary | ICD-10-CM | POA: Diagnosis not present

## 2017-09-25 DIAGNOSIS — H5211 Myopia, right eye: Secondary | ICD-10-CM | POA: Diagnosis not present

## 2017-09-25 DIAGNOSIS — H524 Presbyopia: Secondary | ICD-10-CM | POA: Diagnosis not present

## 2017-09-25 DIAGNOSIS — H04123 Dry eye syndrome of bilateral lacrimal glands: Secondary | ICD-10-CM | POA: Diagnosis not present

## 2017-09-25 DIAGNOSIS — H353131 Nonexudative age-related macular degeneration, bilateral, early dry stage: Secondary | ICD-10-CM | POA: Diagnosis not present

## 2017-09-25 DIAGNOSIS — H401123 Primary open-angle glaucoma, left eye, severe stage: Secondary | ICD-10-CM | POA: Diagnosis not present

## 2017-09-25 DIAGNOSIS — H35363 Drusen (degenerative) of macula, bilateral: Secondary | ICD-10-CM | POA: Diagnosis not present

## 2017-10-03 DIAGNOSIS — H5211 Myopia, right eye: Secondary | ICD-10-CM | POA: Diagnosis not present

## 2017-10-03 DIAGNOSIS — H47233 Glaucomatous optic atrophy, bilateral: Secondary | ICD-10-CM | POA: Diagnosis not present

## 2017-10-03 DIAGNOSIS — H401123 Primary open-angle glaucoma, left eye, severe stage: Secondary | ICD-10-CM | POA: Diagnosis not present

## 2017-10-03 DIAGNOSIS — H401113 Primary open-angle glaucoma, right eye, severe stage: Secondary | ICD-10-CM | POA: Diagnosis not present

## 2017-10-03 DIAGNOSIS — G43B0 Ophthalmoplegic migraine, not intractable: Secondary | ICD-10-CM | POA: Diagnosis not present

## 2017-10-03 DIAGNOSIS — H353131 Nonexudative age-related macular degeneration, bilateral, early dry stage: Secondary | ICD-10-CM | POA: Diagnosis not present

## 2017-10-03 DIAGNOSIS — H52221 Regular astigmatism, right eye: Secondary | ICD-10-CM | POA: Diagnosis not present

## 2017-10-03 DIAGNOSIS — H1820 Unspecified corneal edema: Secondary | ICD-10-CM | POA: Diagnosis not present

## 2017-10-03 DIAGNOSIS — H04123 Dry eye syndrome of bilateral lacrimal glands: Secondary | ICD-10-CM | POA: Diagnosis not present

## 2017-10-03 DIAGNOSIS — H35363 Drusen (degenerative) of macula, bilateral: Secondary | ICD-10-CM | POA: Diagnosis not present

## 2017-10-03 DIAGNOSIS — H18832 Recurrent erosion of cornea, left eye: Secondary | ICD-10-CM | POA: Diagnosis not present

## 2017-10-03 DIAGNOSIS — H524 Presbyopia: Secondary | ICD-10-CM | POA: Diagnosis not present

## 2017-10-05 DIAGNOSIS — H401123 Primary open-angle glaucoma, left eye, severe stage: Secondary | ICD-10-CM | POA: Diagnosis not present

## 2017-10-05 DIAGNOSIS — H5211 Myopia, right eye: Secondary | ICD-10-CM | POA: Diagnosis not present

## 2017-10-05 DIAGNOSIS — H52221 Regular astigmatism, right eye: Secondary | ICD-10-CM | POA: Diagnosis not present

## 2017-10-05 DIAGNOSIS — H04123 Dry eye syndrome of bilateral lacrimal glands: Secondary | ICD-10-CM | POA: Diagnosis not present

## 2017-10-05 DIAGNOSIS — H47233 Glaucomatous optic atrophy, bilateral: Secondary | ICD-10-CM | POA: Diagnosis not present

## 2017-10-05 DIAGNOSIS — G43B0 Ophthalmoplegic migraine, not intractable: Secondary | ICD-10-CM | POA: Diagnosis not present

## 2017-10-05 DIAGNOSIS — H524 Presbyopia: Secondary | ICD-10-CM | POA: Diagnosis not present

## 2017-10-05 DIAGNOSIS — H353131 Nonexudative age-related macular degeneration, bilateral, early dry stage: Secondary | ICD-10-CM | POA: Diagnosis not present

## 2017-10-05 DIAGNOSIS — H35363 Drusen (degenerative) of macula, bilateral: Secondary | ICD-10-CM | POA: Diagnosis not present

## 2017-10-05 DIAGNOSIS — H18832 Recurrent erosion of cornea, left eye: Secondary | ICD-10-CM | POA: Diagnosis not present

## 2017-10-05 DIAGNOSIS — H1820 Unspecified corneal edema: Secondary | ICD-10-CM | POA: Diagnosis not present

## 2017-10-05 DIAGNOSIS — H401113 Primary open-angle glaucoma, right eye, severe stage: Secondary | ICD-10-CM | POA: Diagnosis not present

## 2017-10-12 DIAGNOSIS — H401123 Primary open-angle glaucoma, left eye, severe stage: Secondary | ICD-10-CM | POA: Diagnosis not present

## 2017-10-12 DIAGNOSIS — H47233 Glaucomatous optic atrophy, bilateral: Secondary | ICD-10-CM | POA: Diagnosis not present

## 2017-10-12 DIAGNOSIS — H04123 Dry eye syndrome of bilateral lacrimal glands: Secondary | ICD-10-CM | POA: Diagnosis not present

## 2017-10-12 DIAGNOSIS — G43B0 Ophthalmoplegic migraine, not intractable: Secondary | ICD-10-CM | POA: Diagnosis not present

## 2017-10-12 DIAGNOSIS — H524 Presbyopia: Secondary | ICD-10-CM | POA: Diagnosis not present

## 2017-10-12 DIAGNOSIS — H353131 Nonexudative age-related macular degeneration, bilateral, early dry stage: Secondary | ICD-10-CM | POA: Diagnosis not present

## 2017-10-12 DIAGNOSIS — H401113 Primary open-angle glaucoma, right eye, severe stage: Secondary | ICD-10-CM | POA: Diagnosis not present

## 2017-10-12 DIAGNOSIS — H18832 Recurrent erosion of cornea, left eye: Secondary | ICD-10-CM | POA: Diagnosis not present

## 2017-10-12 DIAGNOSIS — H35363 Drusen (degenerative) of macula, bilateral: Secondary | ICD-10-CM | POA: Diagnosis not present

## 2017-10-12 DIAGNOSIS — H52221 Regular astigmatism, right eye: Secondary | ICD-10-CM | POA: Diagnosis not present

## 2017-10-12 DIAGNOSIS — H1820 Unspecified corneal edema: Secondary | ICD-10-CM | POA: Diagnosis not present

## 2017-10-12 DIAGNOSIS — H5211 Myopia, right eye: Secondary | ICD-10-CM | POA: Diagnosis not present

## 2017-10-20 DIAGNOSIS — H401113 Primary open-angle glaucoma, right eye, severe stage: Secondary | ICD-10-CM | POA: Diagnosis not present

## 2017-10-20 DIAGNOSIS — H18832 Recurrent erosion of cornea, left eye: Secondary | ICD-10-CM | POA: Diagnosis not present

## 2017-10-20 DIAGNOSIS — H401123 Primary open-angle glaucoma, left eye, severe stage: Secondary | ICD-10-CM | POA: Diagnosis not present

## 2017-10-20 DIAGNOSIS — H47233 Glaucomatous optic atrophy, bilateral: Secondary | ICD-10-CM | POA: Diagnosis not present

## 2017-10-20 DIAGNOSIS — H353131 Nonexudative age-related macular degeneration, bilateral, early dry stage: Secondary | ICD-10-CM | POA: Diagnosis not present

## 2017-10-20 DIAGNOSIS — H524 Presbyopia: Secondary | ICD-10-CM | POA: Diagnosis not present

## 2017-10-20 DIAGNOSIS — H5211 Myopia, right eye: Secondary | ICD-10-CM | POA: Diagnosis not present

## 2017-10-20 DIAGNOSIS — G43B0 Ophthalmoplegic migraine, not intractable: Secondary | ICD-10-CM | POA: Diagnosis not present

## 2017-10-20 DIAGNOSIS — H52221 Regular astigmatism, right eye: Secondary | ICD-10-CM | POA: Diagnosis not present

## 2017-10-20 DIAGNOSIS — H35363 Drusen (degenerative) of macula, bilateral: Secondary | ICD-10-CM | POA: Diagnosis not present

## 2017-10-20 DIAGNOSIS — H1820 Unspecified corneal edema: Secondary | ICD-10-CM | POA: Diagnosis not present

## 2017-10-20 DIAGNOSIS — H04123 Dry eye syndrome of bilateral lacrimal glands: Secondary | ICD-10-CM | POA: Diagnosis not present

## 2017-10-24 ENCOUNTER — Telehealth: Payer: Self-pay | Admitting: Internal Medicine

## 2017-10-24 NOTE — Telephone Encounter (Unsigned)
Copied from [place] [phone]. Topic: General - Other >> Oct 24, 2017  2:28 PM Carolyn Stare wrote:  Pt friend call and said she think the pt may have a UTI because of the way she is acting and is asking if they can bring a urine sample in .   Pt friend Pamala Hurry [phone]  >> Oct 24, 2017  4:58 PM Neva Seat wrote: Pt's long time friend - Daine Gravel- checking back if it would be ok for pt to have a sample brought in for testing of UTI and or a Rx can be prescribed due to pt cannot get out of the home due to being frail.

## 2017-10-25 ENCOUNTER — Telehealth: Payer: Self-pay

## 2017-10-25 NOTE — Telephone Encounter (Signed)
Spoke with patient she verbalized understanding and states she will come in for her yearly appointment in December.

## 2017-10-25 NOTE — Telephone Encounter (Signed)
Please advise 

## 2017-10-25 NOTE — Telephone Encounter (Signed)
I am not in the office this pm, but I do feel she needs to be evaluated today.  Recommend evaluation today.

## 2017-10-25 NOTE — Telephone Encounter (Signed)
Called patient to see what symptoms she is having. She states that she is having some dizzy spells but can not specify for how long or when they started but says she feels different especially when walking that when it happens the most. She also states that she is having some burning and stinging in her vaginal area and thinks it maybe a UTI and states that something just doesn't feel right. I informed her that I would let Dr. Bary Leriche nurse know and we call her back. Patient verbalized understanding of this.

## 2017-10-25 NOTE — Telephone Encounter (Signed)
Patina, I spoke with you about this earlier and you stated you would call patient back, please advise. Thank you!!

## 2017-10-25 NOTE — Telephone Encounter (Signed)
See last unrouted msg 

## 2017-10-27 DIAGNOSIS — R350 Frequency of micturition: Secondary | ICD-10-CM | POA: Diagnosis not present

## 2017-10-27 DIAGNOSIS — K5900 Constipation, unspecified: Secondary | ICD-10-CM | POA: Diagnosis not present

## 2017-11-01 ENCOUNTER — Emergency Department
Admission: EM | Admit: 2017-11-01 | Discharge: 2017-11-01 | Disposition: A | Discharge status: Home / Self Care | Payer: Medicare Other | Attending: Emergency Medicine | Admitting: Emergency Medicine

## 2017-11-01 ENCOUNTER — Other Ambulatory Visit: Payer: Self-pay

## 2017-11-01 ENCOUNTER — Telehealth: Payer: Self-pay | Admitting: Internal Medicine

## 2017-11-01 ENCOUNTER — Encounter: Payer: Self-pay | Admitting: Emergency Medicine

## 2017-11-01 ENCOUNTER — Emergency Department: Payer: Medicare Other

## 2017-11-01 DIAGNOSIS — Z8673 Personal history of transient ischemic attack (TIA), and cerebral infarction without residual deficits: Secondary | ICD-10-CM | POA: Diagnosis not present

## 2017-11-01 DIAGNOSIS — Z79899 Other long term (current) drug therapy: Secondary | ICD-10-CM | POA: Insufficient documentation

## 2017-11-01 DIAGNOSIS — R112 Nausea with vomiting, unspecified: Secondary | ICD-10-CM

## 2017-11-01 DIAGNOSIS — R1110 Vomiting, unspecified: Secondary | ICD-10-CM | POA: Diagnosis not present

## 2017-11-01 DIAGNOSIS — R197 Diarrhea, unspecified: Secondary | ICD-10-CM | POA: Diagnosis not present

## 2017-11-01 DIAGNOSIS — Z7901 Long term (current) use of anticoagulants: Secondary | ICD-10-CM | POA: Diagnosis not present

## 2017-11-01 DIAGNOSIS — R109 Unspecified abdominal pain: Secondary | ICD-10-CM | POA: Diagnosis not present

## 2017-11-01 DIAGNOSIS — Z7982 Long term (current) use of aspirin: Secondary | ICD-10-CM | POA: Diagnosis not present

## 2017-11-01 LAB — URINALYSIS, COMPLETE (UACMP) WITH MICROSCOPIC
BACTERIA UA: NONE SEEN
BILIRUBIN URINE: NEGATIVE
Glucose, UA: NEGATIVE mg/dL
HGB URINE DIPSTICK: NEGATIVE
KETONES UR: 80 mg/dL — AB
LEUKOCYTES UA: NEGATIVE
Nitrite: NEGATIVE
Protein, ur: 30 mg/dL — AB
Specific Gravity, Urine: 1.025 (ref 1.005–1.030)
pH: 5 (ref 5.0–8.0)

## 2017-11-01 LAB — CBC
HEMATOCRIT: 42 % (ref 35.0–47.0)
Hemoglobin: 14 g/dL (ref 12.0–16.0)
MCH: 30.7 pg (ref 26.0–34.0)
MCHC: 33.3 g/dL (ref 32.0–36.0)
MCV: 92.2 fL (ref 80.0–100.0)
Platelets: 257 10*3/uL (ref 150–440)
RBC: 4.56 MIL/uL (ref 3.80–5.20)
RDW: 14.5 % (ref 11.5–14.5)
WBC: 12.1 10*3/uL — AB (ref 3.6–11.0)

## 2017-11-01 LAB — COMPREHENSIVE METABOLIC PANEL
ALT: 17 U/L (ref 14–54)
ANION GAP: 12 (ref 5–15)
AST: 29 U/L (ref 15–41)
Albumin: 4.1 g/dL (ref 3.5–5.0)
Alkaline Phosphatase: 54 U/L (ref 38–126)
BUN: 34 mg/dL — ABNORMAL HIGH (ref 6–20)
CHLORIDE: 102 mmol/L (ref 101–111)
CO2: 24 mmol/L (ref 22–32)
Calcium: 8.9 mg/dL (ref 8.9–10.3)
Creatinine, Ser: 1.09 mg/dL — ABNORMAL HIGH (ref 0.44–1.00)
GFR, EST AFRICAN AMERICAN: 48 mL/min — AB (ref >60)
GFR, EST NON AFRICAN AMERICAN: 42 mL/min — AB (ref >60)
Glucose, Bld: 156 mg/dL — ABNORMAL HIGH (ref 65–99)
POTASSIUM: 3.2 mmol/L — AB (ref 3.5–5.1)
Sodium: 138 mmol/L (ref 135–145)
Total Bilirubin: 1.3 mg/dL — ABNORMAL HIGH (ref 0.3–1.2)
Total Protein: 7.1 g/dL (ref 6.5–8.1)

## 2017-11-01 LAB — LIPASE, BLOOD: Lipase: 24 U/L (ref 11–51)

## 2017-11-01 MED ORDER — ONDANSETRON HCL 4 MG/2ML IJ SOLN
4.0000 mg | Freq: Once | INTRAMUSCULAR | Status: AC
  Administered 2017-11-01: 4 mg via INTRAVENOUS
  Filled 2017-11-01: qty 2

## 2017-11-01 MED ORDER — IOHEXOL 300 MG/ML  SOLN
75.0000 mL | Freq: Once | INTRAMUSCULAR | Status: AC | PRN
  Administered 2017-11-01: 75 mL via INTRAVENOUS

## 2017-11-01 MED ORDER — ONDANSETRON 4 MG PO TBDP: 4.0000 mg | ORAL_TABLET | Freq: Three times a day (TID) | ORAL | 0 refills | Status: DC | PRN

## 2017-11-01 MED ORDER — SODIUM CHLORIDE 0.9 % IV BOLUS
500.0000 mL | Freq: Once | INTRAVENOUS | Status: AC
  Administered 2017-11-01: 500 mL via INTRAVENOUS

## 2017-11-01 NOTE — ED Provider Notes (Signed)
[facility] Emergency Department Provider Note  ____________________________________________   First MD Initiated Contact with Patient 11/01/17 [phone]     (approximate)  I have reviewed the triage vital signs and the nursing notes.   HISTORY  Chief Complaint Emesis and Diarrhea   HPI Christy Cisneros is a 84 y.o. female who comes to the emergency department via EMS with sudden onset moderate severity cramping diffuse abdominal pain nausea vomiting and diarrhea that began in the middle of the night.  She was diagnosed with a urinary tract infection recently and was prescribed Macrobid.  She denies fevers or chills.  She denies back pain.  She reports having a previous abdominal surgery although she does not know for what.  Her pain now is constant.  Nothing seems to make it better or worse.  Past Medical History:  Diagnosis Date  . Barrett's esophagus with esophagitis   . Chronic bronchitis ([place])   . GERD (gastroesophageal reflux disease)   . Glaucoma   . Hypercholesteremia   . Osteoarthritis   . Osteoporosis   . Sleep apnea    has CPAP  . TIA (transient ischemic attack)     Patient Active Problem List   Diagnosis Date Noted  . History of CVA (cerebrovascular accident) 03/16/2017  . Memory change 09/04/2016  . Pneumonia 07/17/2016  . Mild cognitive impairment 05/15/2016  . Dizziness 04/21/2016  . Neck pain 02/11/2016  . Muscle cramps 11/14/2015  . Urine incontinence 08/16/2015  . Hematoma 08/16/2015  . Weakness 07/12/2015  . Headache 06/16/2015  . Unsteady gait 06/01/2015  . GERD (gastroesophageal reflux disease) 08/11/2014  . UTI (urinary tract infection) 08/07/2014  . DYSPNEA 04/17/2009  . HYPERCHOLESTEROLEMIA 04/16/2009  . Obstructive sleep apnea 04/16/2009  . Glaucoma 04/16/2009  . Transient cerebral ischemia 04/16/2009  . BARRETTS ESOPHAGUS 04/16/2009  . OSTEOARTHRITIS 04/16/2009  . Osteoporosis 04/16/2009    Past Surgical  History:  Procedure Laterality Date  . BUNIONECTOMY     left  . ROTATOR CUFF REPAIR     right    Prior to Admission medications   Medication Sig Start Date End Date Taking? Authorizing Provider  Aloe LIQD Take by mouth daily.     [provider]  aspirin 81 MG tablet Take 81 mg by mouth daily.    [provider]  atorvastatin (LIPITOR) 40 MG tablet Take 1 tablet (40 mg total) by mouth daily at 6 PM. 04/14/17   Einar Pheasant, MD  calcium carbonate (OS-CAL) 600 MG TABS Take 600 mg by mouth daily.    [provider]  clopidogrel (PLAVIX) 75 MG tablet Take 1 tablet (75 mg total) by mouth daily. 04/14/17   Einar Pheasant, MD  Flaxseed, Linseed, (GROUND FLAX SEEDS PO) Take by mouth daily.     [provider]  Multiple Vitamins-Minerals (PRESERVISION AREDS 2 PO) Take by mouth 2 (two) times daily.    [provider]  omeprazole (PRILOSEC) 20 MG capsule TAKE ONE CAPSULE TWICE A DAY BEFORE MEALS 11/17/16   Einar Pheasant, MD  ondansetron (ZOFRAN ODT) 4 MG disintegrating tablet Take 1 tablet (4 mg total) by mouth every 8 (eight) hours as needed for nausea or vomiting. 11/01/17   Darel Hong, MD  prednisoLONE acetate (PRED FORTE) 1 % ophthalmic suspension Place 1 drop into the left eye 2 (two) times daily.    [provider]  timolol (BETIMOL) 0.5 % ophthalmic solution Place 1 drop into the left eye 2 (two) times daily.  [provider]  trimethoprim (TRIMPEX) 100 MG tablet Take 1 tablet (100 mg total) by mouth 2 (two) times daily. 08/10/17   Delano Metz, FNP    Allergies Alphagan [brimonidine]; Avelox [moxifloxacin hcl in nacl]; Bacitracin; Cefdinir; Cephalexin; Clarithromycin; Codeine; Erythromycin; Hydrocodone-acetaminophen; Lansoprazole; Polymyxin b; and Vigamox [moxifloxacin]  Family History  Problem Relation Age of Onset  . Breast cancer Sister   . Heart disease Mother     Social History Social History   Tobacco  Use  . Smoking status: Never Smoker  . Smokeless tobacco: Never Used  Substance Use Topics  . Alcohol use: Yes    Alcohol/week: 0.0 oz    Comment: wine/brandy occasional  . Drug use: No    Review of Systems Constitutional: No fever/chills Eyes: No visual changes. ENT: No sore throat. Cardiovascular: Denies chest pain. Respiratory: Denies shortness of breath. Gastrointestinal: Positive for abdominal pain.  Positive for nausea, positive for vomiting.  Positive for diarrhea.  No constipation. Genitourinary: Negative for dysuria. Musculoskeletal: Negative for back pain. Skin: Negative for rash. Neurological: Negative for headaches, focal weakness or numbness.   ____________________________________________   PHYSICAL EXAM:  VITAL SIGNS: ED Triage Vitals [11/01/17 0651]  Enc Vitals Group     BP (!) 157/79     Pulse Rate 96     Resp 19     Temp (!) 97.3 F (36.3 C)     Temp Source Oral     SpO2 96 %     Weight. 110 lb (49.9 kg)     Height 5\' 3"  (1.6 m)     Head Circumference      Peak Flow      Pain Score 0     Pain Loc      Pain Edu?      Excl. in [place]?     Constitutional: Alert and oriented x4 pleasant cooperative speaks in full clear sentences no diaphoresis Eyes: PERRL EOMI. Head: Atraumatic. Nose: No congestion/rhinnorhea. Mouth/Throat: No trismus Neck: No stridor.   Cardiovascular: Normal rate, regular rhythm. Grossly normal heart sounds.  Good peripheral circulation. Respiratory: Normal respiratory effort.  No retractions. Lungs CTAB and moving good air Gastrointestinal: Soft mild diffuse tenderness with no focality no rebound or guarding no peritonitis no McBurney's tenderness negative Rovsing Musculoskeletal: No lower extremity edema   Neurologic:  Normal speech and language. No gross focal neurologic deficits are appreciated. Skin:  Skin is warm, dry and intact. No rash noted. Psychiatric: Mood and affect are normal. Speech and behavior are  normal.    ____________________________________________   DIFFERENTIAL includes but not limited to  Viral gastroenteritis, bacterial gastroenteritis, mesenteric ischemia, small bowel obstruction, volvulus, Clostridium difficile ____________________________________________   LABS (all labs ordered are listed, but only abnormal results are displayed)  Labs Reviewed  COMPREHENSIVE METABOLIC PANEL - Abnormal; Notable for the following components:      Result Value   Potassium 3.2 (*)    Glucose, Bld 156 (*)    BUN 34 (*)    Creatinine, Ser 1.09 (*)    Total Bilirubin 1.3 (*)    GFR calc non Af Amer 42 (*)    GFR calc Af Amer 48 (*)    All other components within normal limits  CBC - Abnormal; Notable for the following components:   WBC 12.1 (*)    All other components within normal limits  LIPASE, BLOOD  URINALYSIS, COMPLETE (UACMP) WITH MICROSCOPIC    Lab work reviewed by me with no acute disease __________________________________________  EKG   ____________________________________________  RADIOLOGY  CT abdomen pelvis reviewed by me with no acute disease ____________________________________________   PROCEDURES  Procedure(s) performed: no  Procedures  Critical Care performed: no  Observation: no ____________________________________________   INITIAL IMPRESSION / ASSESSMENT AND PLAN / ED COURSE  Pertinent labs & imaging results that were available during my care of the patient were reviewed by me and considered in my medical decision making (see chart for details).  The patient's symptoms of sudden onset nausea vomiting diarrhea with crampy abdominal pain sounds consistent with viral gastroenteritis, however given her advanced age differential is extremely broad.  She has recently begun taking new antibiotics as C. difficile is in the differential.  She is borderline tachycardic so I will gently fluid hydrate her in addition to antiemetics and she will  require a CT scan with IV contrast.     ----------------------------------------- 8:40 AM on 11/01/2017 -----------------------------------------  Fortunately the patient's CT scan and blood work were reassuring.  After a dose of Zofran her symptoms are nearly completely resolved.  She likely has viral gastroenteritis.  She is able to eat and drink at this point.  Strict return precautions have been discussed and the patient verbalizes understanding and agree with the plan. ____________________________________________   FINAL CLINICAL IMPRESSION(S) / ED DIAGNOSES  Final diagnoses:  Nausea vomiting and diarrhea      NEW MEDICATIONS STARTED DURING THIS VISIT:  New Prescriptions   ONDANSETRON (ZOFRAN ODT) 4 MG DISINTEGRATING TABLET    Take 1 tablet (4 mg total) by mouth every 8 (eight) hours as needed for nausea or vomiting.     Note:  This document was prepared using Dragon voice recognition software and may include unintentional dictation errors.     Darel Hong, MD 11/01/17 [phone]

## 2017-11-01 NOTE — Telephone Encounter (Signed)
FYI patient into office DX with UTI on Friday at [facility] given Macrobid 100 mg BID patient developed diarrhea this morning with nausea and vomiting seen at ED, came into office today confused on medications, and confused as to next steps in treatment spoke with patient and advised patient to be bring in medications for appointment tomorrow, patient seem very different to nurse and PCP has appt available scheduled to see PCP tomorrow morning , left eye crusty and patient says she is using drops concerned of infection to eye.

## 2017-11-01 NOTE — ED Triage Notes (Signed)
Patient coming from home via [facility] for nausea, vomitting, diarrhea. States it started in the middle of the night and she started marcobid on 3/29 for a UTI. Vss with EMS. Denies any pain.

## 2017-11-01 NOTE — Discharge Instructions (Signed)
Please make sure you remain well-hydrated and take your Zofran as needed for severe symptoms.  Follow-up with your primary care physician as needed and return to the emergency department for any concerns whatsoever.  It was a pleasure to take care of you today, and thank you for coming to our emergency department.  If you have any questions or concerns before leaving please ask the nurse to grab me and I'm more than happy to go through your aftercare instructions again.  If you were prescribed any opioid pain medication today such as Norco, Vicodin, Percocet, morphine, hydrocodone, or oxycodone please make sure you do not drive when you are taking this medication as it can alter your ability to drive safely.  If you have any concerns once you are home that you are not improving or are in fact getting worse before you can make it to your follow-up appointment, please do not hesitate to call 911 and come back for further evaluation.  Darel Hong, MD  Results for orders placed or performed during the hospital encounter of 11/01/17  Lipase, blood  Result Value Ref Range   Lipase 24 11 - 51 U/L  Comprehensive metabolic panel  Result Value Ref Range   Sodium 138 135 - 145 mmol/L   Potassium 3.2 (L) 3.5 - 5.1 mmol/L   Chloride 102 101 - 111 mmol/L   CO2 24 22 - 32 mmol/L   Glucose, Bld 156 (H) 65 - 99 mg/dL   BUN 34 (H) 6 - 20 mg/dL   Creatinine, Ser 1.09 (H) 0.44 - 1.00 mg/dL   Calcium 8.9 8.9 - 10.3 mg/dL   Total Protein 7.1 6.5 - 8.1 g/dL   Albumin 4.1 3.5 - 5.0 g/dL   AST 29 15 - 41 U/L   ALT 17 14 - 54 U/L   Alkaline Phosphatase 54 38 - 126 U/L   Total Bilirubin 1.3 (H) 0.3 - 1.2 mg/dL   GFR calc non Af Amer 42 (L) >60 mL/min   GFR calc Af Amer 48 (L) >60 mL/min   Anion gap 12 5 - 15  CBC  Result Value Ref Range   WBC 12.1 (H) 3.6 - 11.0 K/uL   RBC 4.56 3.80 - 5.20 MIL/uL   Hemoglobin 14.0 12.0 - 16.0 g/dL   HCT 42.0 35.0 - 47.0 %   MCV 92.2 80.0 - 100.0 fL   MCH 30.7 26.0 -  34.0 pg   MCHC 33.3 32.0 - 36.0 g/dL   RDW 14.5 11.5 - 14.5 %   Platelets 257 150 - 440 K/uL   Ct Abdomen Pelvis W Contrast  Result Date: 11/01/2017 CLINICAL DATA:  Nausea and vomiting.  Diarrhea. EXAM: CT ABDOMEN AND PELVIS WITH CONTRAST TECHNIQUE: Multidetector CT imaging of the abdomen and pelvis was performed using the standard protocol following bolus administration of intravenous contrast. CONTRAST:  53mL OMNIPAQUE IOHEXOL 300 MG/ML  SOLN COMPARISON:  October 14, 2016 FINDINGS: Lower chest: There is bibasilar scarring and atelectatic change. Hepatobiliary: No focal liver lesions are appreciable. Gallbladder wall is not appreciably thickened. There is no biliary duct dilatation. Pancreas: Pancreas is somewhat atrophic. Pancreatic duct remains mildly prominent in the head and proximal body regions. No pancreatic mass or inflammatory focus evident. Spleen: No splenic lesions are appreciable. Adrenals/Urinary Tract: Adrenals bilaterally appear unremarkable. Kidneys bilaterally show no evident mass or hydronephrosis on either side. There is no appreciable renal or ureteral calculus on either side. Urinary bladder is midline with wall thickness within normal limits. Stomach/Bowel:  There are scattered sigmoid diverticula without diverticulitis. There is no appreciable bowel wall or mesenteric thickening. No evident bowel obstruction. No free air or portal venous air. Vascular/Lymphatic: There is atherosclerotic calcification in the aorta and common iliac arteries. Aorta is tortuous without aneurysm. Major mesenteric vessels appear patent, although there are foci of atherosclerotic calcification in proximal major mesenteric arterial vessels. There is no appreciable adenopathy in the abdomen or pelvis. Reproductive: Uterus is anteverted. Uterus is atrophic consistent with age. No pelvic mass evident. Other: No periappendiceal region inflammation is evident. No ascites or abscess is evident in the abdomen or pelvis.  Musculoskeletal: There is degenerative change in scoliosis in the lumbar region. There are no blastic or lytic bone lesions. There is moderate spinal stenosis at L4-5 due to bony hypertrophy and diffuse disc protrusion. No intramuscular or abdominal wall lesions are evident. IMPRESSION: 1. No bowel obstruction. Scattered sigmoid diverticula without diverticulitis evident. No abscess. No periappendiceal region inflammation. 2.  No renal or ureteral calculus.  No hydronephrosis. 3. Moderate spinal stenosis at L4-5 due to bony hypertrophy and diffuse disc protrusion. Multilevel arthropathy in the lumbar spine. 4.  Aortoiliac atherosclerosis.  Aorta tortuous without aneurysm. Aortic Atherosclerosis (ICD10-I70.0). Electronically Signed   By: Lowella Grip III M.D.   On: 11/01/2017 08:14

## 2017-11-01 NOTE — ED Notes (Signed)
Patient transported to CT at this time.  Patient is alert and oriented and is in no obvious distress.

## 2017-11-02 ENCOUNTER — Encounter: Payer: Self-pay | Admitting: Internal Medicine

## 2017-11-02 ENCOUNTER — Ambulatory Visit (INDEPENDENT_AMBULATORY_CARE_PROVIDER_SITE_OTHER): Payer: Medicare Other | Admitting: Internal Medicine

## 2017-11-02 VITALS — BP 114/58 | HR 70 | Temp 97.4°F | Resp 16 | Wt 99.6 lb

## 2017-11-02 DIAGNOSIS — K2100 Gastro-esophageal reflux disease with esophagitis, without bleeding: Secondary | ICD-10-CM

## 2017-11-02 DIAGNOSIS — D72829 Elevated white blood cell count, unspecified: Secondary | ICD-10-CM

## 2017-11-02 DIAGNOSIS — H524 Presbyopia: Secondary | ICD-10-CM | POA: Diagnosis not present

## 2017-11-02 DIAGNOSIS — R413 Other amnesia: Secondary | ICD-10-CM

## 2017-11-02 DIAGNOSIS — R197 Diarrhea, unspecified: Secondary | ICD-10-CM | POA: Diagnosis not present

## 2017-11-02 DIAGNOSIS — H52221 Regular astigmatism, right eye: Secondary | ICD-10-CM | POA: Diagnosis not present

## 2017-11-02 DIAGNOSIS — Z8673 Personal history of transient ischemic attack (TIA), and cerebral infarction without residual deficits: Secondary | ICD-10-CM | POA: Diagnosis not present

## 2017-11-02 DIAGNOSIS — E7800 Pure hypercholesterolemia, unspecified: Secondary | ICD-10-CM

## 2017-11-02 DIAGNOSIS — H401113 Primary open-angle glaucoma, right eye, severe stage: Secondary | ICD-10-CM | POA: Diagnosis not present

## 2017-11-02 DIAGNOSIS — H18832 Recurrent erosion of cornea, left eye: Secondary | ICD-10-CM | POA: Diagnosis not present

## 2017-11-02 DIAGNOSIS — E876 Hypokalemia: Secondary | ICD-10-CM

## 2017-11-02 DIAGNOSIS — H401123 Primary open-angle glaucoma, left eye, severe stage: Secondary | ICD-10-CM | POA: Diagnosis not present

## 2017-11-02 DIAGNOSIS — K2270 Barrett's esophagus without dysplasia: Secondary | ICD-10-CM | POA: Diagnosis not present

## 2017-11-02 DIAGNOSIS — H47233 Glaucomatous optic atrophy, bilateral: Secondary | ICD-10-CM | POA: Diagnosis not present

## 2017-11-02 DIAGNOSIS — H1089 Other conjunctivitis: Secondary | ICD-10-CM | POA: Diagnosis not present

## 2017-11-02 DIAGNOSIS — H5211 Myopia, right eye: Secondary | ICD-10-CM | POA: Diagnosis not present

## 2017-11-02 DIAGNOSIS — H1820 Unspecified corneal edema: Secondary | ICD-10-CM | POA: Diagnosis not present

## 2017-11-02 DIAGNOSIS — H353131 Nonexudative age-related macular degeneration, bilateral, early dry stage: Secondary | ICD-10-CM | POA: Diagnosis not present

## 2017-11-02 DIAGNOSIS — H04123 Dry eye syndrome of bilateral lacrimal glands: Secondary | ICD-10-CM | POA: Diagnosis not present

## 2017-11-02 DIAGNOSIS — G43B0 Ophthalmoplegic migraine, not intractable: Secondary | ICD-10-CM | POA: Diagnosis not present

## 2017-11-02 LAB — CBC WITH DIFFERENTIAL/PLATELET
Basophils Absolute: 0 10*3/uL (ref 0.0–0.1)
Basophils Relative: 0.5 % (ref 0.0–3.0)
Eosinophils Absolute: 0.7 10*3/uL (ref 0.0–0.7)
Eosinophils Relative: 8.2 % — ABNORMAL HIGH (ref 0.0–5.0)
HEMATOCRIT: 39.1 % (ref 36.0–46.0)
HEMOGLOBIN: 13.1 g/dL (ref 12.0–15.0)
Lymphs Abs: 0.6 10*3/uL — ABNORMAL LOW (ref 0.7–4.0)
MCHC: 33.5 g/dL (ref 30.0–36.0)
MCV: 91.6 fl (ref 78.0–100.0)
MONOS PCT: 8.7 % (ref 3.0–12.0)
Monocytes Absolute: 0.8 10*3/uL (ref 0.1–1.0)
Neutro Abs: 6.5 10*3/uL (ref 1.4–7.7)
Neutrophils Relative %: 76 % (ref 43.0–77.0)
Platelets: 263 10*3/uL (ref 150.0–400.0)
RBC: 4.26 Mil/uL (ref 3.87–5.11)
RDW: 14.8 % (ref 11.5–15.5)
WBC: 8.6 10*3/uL (ref 4.0–10.5)

## 2017-11-02 LAB — HEPATIC FUNCTION PANEL
ALBUMIN: 3.5 g/dL (ref 3.5–5.2)
ALK PHOS: 44 U/L (ref 39–117)
ALT: 15 U/L (ref 0–35)
AST: 19 U/L (ref 0–37)
Bilirubin, Direct: 0.1 mg/dL (ref 0.0–0.3)
TOTAL PROTEIN: 6.4 g/dL (ref 6.0–8.3)
Total Bilirubin: 0.7 mg/dL (ref 0.2–1.2)

## 2017-11-02 LAB — BASIC METABOLIC PANEL
BUN: 26 mg/dL — ABNORMAL HIGH (ref 6–23)
CALCIUM: 8.7 mg/dL (ref 8.4–10.5)
CO2: 27 meq/L (ref 19–32)
CREATININE: 1 mg/dL (ref 0.40–1.20)
Chloride: 104 mEq/L (ref 96–112)
GFR: 54.7 mL/min — ABNORMAL LOW (ref >60.00)
GLUCOSE: 97 mg/dL (ref 70–99)
Potassium: 3.5 mEq/L (ref 3.5–5.1)
SODIUM: 139 meq/L (ref 135–145)

## 2017-11-02 NOTE — Telephone Encounter (Signed)
Noted.  Can see me before appt.

## 2017-11-02 NOTE — Telephone Encounter (Signed)
Spoke concerns with PCP.

## 2017-11-02 NOTE — Progress Notes (Signed)
Patient ID: Christy Cisneros, female   DOB: 16-Sep-1919, 84 y.o.   MRN: [id number]   Subjective:    Patient ID: Christy Cisneros, female    DOB: 09-14-1919, 84 y.o.   MRN: [id number]  HPI  Patient here for ER follow up. She is accompanied by her friend.  History obtained from both of them.  Was seen in the ER yesterday for abdominal pain and nausea, vomiting and diarrhea.  Had been on macrobid prior to this onset.  ER note reviewed.   Had CT scan that did not reveal any acute abnormality.  Was given zofran in ER.  Symptoms nearly completely resolved - per note.  States since her ER visit, she has not had any nausea, vomiting or diarrhea.  No abdominal pain.  Did eat some chicken soup.  No chest pain.  No sob.  Some concerns regarding her memory.  We discussed today.  Discussed concerns over her safety at home.  Discussed concerns regarding her not taking her medications as directed.  Discussed the need for support in her home and discussed looking into assisted living options.     Past Medical History:  Diagnosis Date  . Barrett's esophagus with esophagitis   . Chronic bronchitis ([place])   . GERD (gastroesophageal reflux disease)   . Glaucoma   . Hypercholesteremia   . Osteoarthritis   . Osteoporosis   . Sleep apnea    has CPAP  . TIA (transient ischemic attack)    Past Surgical History:  Procedure Laterality Date  . BUNIONECTOMY     left  . ROTATOR CUFF REPAIR     right   Family History  Problem Relation Age of Onset  . Breast cancer Sister   . Heart disease Mother    Social History   Socioeconomic History  . Marital status: Single    Spouse name: Not on file  . Number of children: Not on file  . Years of education: Not on file  . Highest education level: Not on file  Occupational History  . Not on file  Social Needs  . Financial resource strain: Not on file  . Food insecurity:    Worry: Not on file    Inability: Not on file  . Transportation needs:    Medical:  Not on file    Non-medical: Not on file  Tobacco Use  . Smoking status: Never Smoker  . Smokeless tobacco: Never Used  Substance and Sexual Activity  . Alcohol use: Yes    Alcohol/week: 0.0 oz    Comment: wine/brandy occasional  . Drug use: No  . Sexual activity: Never  Lifestyle  . Physical activity:    Days per week: 7 days    Minutes per session: 40 min  . Stress: Only a little  Relationships  . Social connections:    Talks on phone: Patient refused    Gets together: Patient refused    Attends religious service: Patient refused    Active member of club or organization: Patient refused    Attends meetings of clubs or organizations: Patient refused    Relationship status: Patient refused  Other Topics Concern  . Not on file  Social History Narrative  . Not on file    Outpatient Encounter Medications as of 11/02/2017  Medication Sig  . Aloe LIQD Take by mouth daily.   Marland Kitchen aspirin 81 MG tablet Take 81 mg by mouth daily.  . calcium carbonate (OS-CAL) 600 MG TABS Take 600 mg by  mouth daily.  . clopidogrel (PLAVIX) 75 MG tablet Take 1 tablet (75 mg total) by mouth daily.  . Flaxseed, Linseed, (GROUND FLAX SEEDS PO) Take by mouth daily.   . Multiple Vitamins-Minerals (PRESERVISION AREDS 2 PO) Take by mouth 2 (two) times daily.  Marland Kitchen omeprazole (PRILOSEC) 20 MG capsule TAKE ONE CAPSULE TWICE A DAY BEFORE MEALS  . prednisoLONE acetate (PRED FORTE) 1 % ophthalmic suspension Place 1 drop into the left eye 2 (two) times daily.  . timolol (BETIMOL) 0.5 % ophthalmic solution Place 1 drop into the left eye 2 (two) times daily.   . [DISCONTINUED] atorvastatin (LIPITOR) 40 MG tablet Take 1 tablet (40 mg total) by mouth daily at 6 PM.  . [DISCONTINUED] ondansetron (ZOFRAN ODT) 4 MG disintegrating tablet Take 1 tablet (4 mg total) by mouth every 8 (eight) hours as needed for nausea or vomiting.  . [DISCONTINUED] trimethoprim (TRIMPEX) 100 MG tablet Take 1 tablet (100 mg total) by mouth 2 (two)  times daily.   No facility-administered encounter medications on file as of 11/02/2017.     Review of Systems  Constitutional: Negative for fever and unexpected weight change.  HENT: Negative for congestion and sinus pressure.   Respiratory: Negative for cough, chest tightness and shortness of breath.   Cardiovascular: Negative for chest pain, palpitations and leg swelling.  Gastrointestinal:       Previous nausea, vomiting and diarrhea.  Resolved today.    Genitourinary: Negative for difficulty urinating and dysuria.  Musculoskeletal: Negative for joint swelling and myalgias.  Skin: Negative for color change and rash.  Neurological: Negative for dizziness, light-headedness and headaches.  Psychiatric/Behavioral: Negative for agitation and dysphoric mood.       Objective:    Physical Exam  Constitutional: She appears well-developed and well-nourished. No distress.  HENT:  Nose: Nose normal.  Mouth/Throat: Oropharynx is clear and moist.  Neck: Neck supple. No thyromegaly present.  Cardiovascular: Normal rate and regular rhythm.  Pulmonary/Chest: Breath sounds normal. No respiratory distress. She has no wheezes.  Abdominal: Soft. Bowel sounds are normal. There is no tenderness.  Musculoskeletal: She exhibits no edema or tenderness.  Lymphadenopathy:    She has no cervical adenopathy.  Skin: No rash noted. No erythema.  Psychiatric: She has a normal mood and affect. Her behavior is normal.    BP (!) 114/58 (BP Location: Left Arm, Patient Position: Sitting)   Pulse 70   Temp (!) 97.4 F (36.3 C) (Oral)   Resp 16   Wt 99 lb 9.6 oz (45.2 kg)   SpO2 98%   BMI 17.64 kg/m  Wt Readings from Last 3 Encounters:  11/02/17 99 lb 9.6 oz (45.2 kg)  11/01/17 110 lb (49.9 kg)  08/30/17 105 lb (47.6 kg)     Lab Results  Component Value Date   WBC 8.6 11/02/2017   HGB 13.1 11/02/2017   HCT 39.1 11/02/2017   PLT 263.0 11/02/2017   GLUCOSE 97 11/02/2017   CHOL 207 (H) 03/17/2017     TRIG 93 03/17/2017   HDL 72 03/17/2017   LDLCALC 116 (H) 03/17/2017   ALT 15 11/02/2017   AST 19 11/02/2017   NA 139 11/02/2017   K 3.5 11/02/2017   CL 104 11/02/2017   CREATININE 1.00 11/02/2017   BUN 26 (H) 11/02/2017   CO2 27 11/02/2017   TSH 1.47 08/30/2017   INR 0.96 03/16/2017   HGBA1C 5.4 03/17/2017    Ct Abdomen Pelvis W Contrast  Result Date: 11/01/2017 CLINICAL DATA:  Nausea and vomiting.  Diarrhea. EXAM: CT ABDOMEN AND PELVIS WITH CONTRAST TECHNIQUE: Multidetector CT imaging of the abdomen and pelvis was performed using the standard protocol following bolus administration of intravenous contrast. CONTRAST:  3mL OMNIPAQUE IOHEXOL 300 MG/ML  SOLN COMPARISON:  October 14, 2016 FINDINGS: Lower chest: There is bibasilar scarring and atelectatic change. Hepatobiliary: No focal liver lesions are appreciable. Gallbladder wall is not appreciably thickened. There is no biliary duct dilatation. Pancreas: Pancreas is somewhat atrophic. Pancreatic duct remains mildly prominent in the head and proximal body regions. No pancreatic mass or inflammatory focus evident. Spleen: No splenic lesions are appreciable. Adrenals/Urinary Tract: Adrenals bilaterally appear unremarkable. Kidneys bilaterally show no evident mass or hydronephrosis on either side. There is no appreciable renal or ureteral calculus on either side. Urinary bladder is midline with wall thickness within normal limits. Stomach/Bowel: There are scattered sigmoid diverticula without diverticulitis. There is no appreciable bowel wall or mesenteric thickening. No evident bowel obstruction. No free air or portal venous air. Vascular/Lymphatic: There is atherosclerotic calcification in the aorta and common iliac arteries. Aorta is tortuous without aneurysm. Major mesenteric vessels appear patent, although there are foci of atherosclerotic calcification in proximal major mesenteric arterial vessels. There is no appreciable adenopathy in the  abdomen or pelvis. Reproductive: Uterus is anteverted. Uterus is atrophic consistent with age. No pelvic mass evident. Other: No periappendiceal region inflammation is evident. No ascites or abscess is evident in the abdomen or pelvis. Musculoskeletal: There is degenerative change in scoliosis in the lumbar region. There are no blastic or lytic bone lesions. There is moderate spinal stenosis at L4-5 due to bony hypertrophy and diffuse disc protrusion. No intramuscular or abdominal wall lesions are evident. IMPRESSION: 1. No bowel obstruction. Scattered sigmoid diverticula without diverticulitis evident. No abscess. No periappendiceal region inflammation. 2.  No renal or ureteral calculus.  No hydronephrosis. 3. Moderate spinal stenosis at L4-5 due to bony hypertrophy and diffuse disc protrusion. Multilevel arthropathy in the lumbar spine. 4.  Aortoiliac atherosclerosis.  Aorta tortuous without aneurysm. Aortic Atherosclerosis (ICD10-I70.0). Electronically Signed   By: Lowella Grip III M.D.   On: 11/01/2017 08:14       Assessment & Plan:   Problem List Items Addressed This Visit    BARRETTS ESOPHAGUS    Discussed the need to continue prilosec.        GERD (gastroesophageal reflux disease)    No acid reflux reported.  Follow.       History of CVA (cerebrovascular accident)    Discussed the need to take plavix.  Follow.        HYPERCHOLESTEROLEMIA    Not taking lipitor.  Has been confused regarding her medication.  Will hold on statin medication at this time.        Memory change    Saw neurology.  Concern regarding some progressive memory issues.  Concern regarding her compliance with medication.  Discussed her safety at home.  Discussed help in the home and discussed the possibility of looking into assisted living options.  She is concerned regarding cost.  Will have PACE - evaluate.         Other Visit Diagnoses    Hypokalemia    -  Primary   Relevant Orders   Basic metabolic  panel (Completed)   Hyperbilirubinemia       Relevant Orders   Hepatic function panel (Completed)   Leukocytosis, unspecified type       Relevant Orders   CBC with Differential/Platelet (Completed)  Diarrhea, unspecified type       Recent ER evaluated.  recently on abx.  resolved now.  feel viral syndrome.  doubt c.diff since resloved.  bland foods.  fluids.  recheck labs.         Einar Pheasant, MD

## 2017-11-03 ENCOUNTER — Other Ambulatory Visit: Payer: Self-pay | Admitting: Internal Medicine

## 2017-11-03 ENCOUNTER — Telehealth: Payer: Self-pay | Admitting: Internal Medicine

## 2017-11-03 DIAGNOSIS — D72810 Lymphocytopenia: Secondary | ICD-10-CM

## 2017-11-03 NOTE — Progress Notes (Signed)
Order placed for f/u cbc.   

## 2017-11-03 NOTE — Telephone Encounter (Signed)
Copied from [place]. Topic: Quick Communication - See Telephone Encounter >> Nov 03, 2017 10:41 AM Synthia Innocent wrote: CRM for notification. See Telephone encounter for: 11/03/17. Wants to let provider know that they believe the diarrhea was a virus, Pamala Hurry now has it.

## 2017-11-03 NOTE — Telephone Encounter (Signed)
Please advise 

## 2017-11-05 ENCOUNTER — Encounter: Payer: Self-pay | Admitting: Internal Medicine

## 2017-11-05 NOTE — Telephone Encounter (Signed)
See my office note.  Christy Cisneros's diarrhea had resolved prior to her visit with me.  Agree with probable viral illness.  See note.

## 2017-11-05 NOTE — Assessment & Plan Note (Signed)
Saw neurology.  Concern regarding some progressive memory issues.  Concern regarding her compliance with medication.  Discussed her safety at home.  Discussed help in the home and discussed the possibility of looking into assisted living options.  She is concerned regarding cost.  Will have PACE - evaluate.

## 2017-11-05 NOTE — Assessment & Plan Note (Signed)
No acid reflux reported.  Follow.    

## 2017-11-05 NOTE — Assessment & Plan Note (Signed)
Discussed the need to continue prilosec.

## 2017-11-05 NOTE — Assessment & Plan Note (Signed)
Discussed the need to take plavix.  Follow.

## 2017-11-05 NOTE — Assessment & Plan Note (Signed)
Not taking lipitor.  Has been confused regarding her medication.  Will hold on statin medication at this time.

## 2017-11-06 DIAGNOSIS — H401113 Primary open-angle glaucoma, right eye, severe stage: Secondary | ICD-10-CM | POA: Diagnosis not present

## 2017-11-06 DIAGNOSIS — H52221 Regular astigmatism, right eye: Secondary | ICD-10-CM | POA: Diagnosis not present

## 2017-11-06 DIAGNOSIS — H1089 Other conjunctivitis: Secondary | ICD-10-CM | POA: Diagnosis not present

## 2017-11-06 DIAGNOSIS — H524 Presbyopia: Secondary | ICD-10-CM | POA: Diagnosis not present

## 2017-11-06 DIAGNOSIS — G43B0 Ophthalmoplegic migraine, not intractable: Secondary | ICD-10-CM | POA: Diagnosis not present

## 2017-11-06 DIAGNOSIS — H18832 Recurrent erosion of cornea, left eye: Secondary | ICD-10-CM | POA: Diagnosis not present

## 2017-11-06 DIAGNOSIS — H401123 Primary open-angle glaucoma, left eye, severe stage: Secondary | ICD-10-CM | POA: Diagnosis not present

## 2017-11-06 DIAGNOSIS — H47233 Glaucomatous optic atrophy, bilateral: Secondary | ICD-10-CM | POA: Diagnosis not present

## 2017-11-06 DIAGNOSIS — H5211 Myopia, right eye: Secondary | ICD-10-CM | POA: Diagnosis not present

## 2017-11-06 DIAGNOSIS — H1820 Unspecified corneal edema: Secondary | ICD-10-CM | POA: Diagnosis not present

## 2017-11-06 DIAGNOSIS — H04123 Dry eye syndrome of bilateral lacrimal glands: Secondary | ICD-10-CM | POA: Diagnosis not present

## 2017-11-06 DIAGNOSIS — H353132 Nonexudative age-related macular degeneration, bilateral, intermediate dry stage: Secondary | ICD-10-CM | POA: Diagnosis not present

## 2017-11-07 NOTE — Telephone Encounter (Signed)
Patient has been referred to PACE , also advised Glenda at [facility] patient hard of hearing and that patient would respond if she advises that Dr. Bary Leriche office made the recommendation for the PACE program.

## 2017-11-07 NOTE — Telephone Encounter (Signed)
-----   Message from Einar Pheasant, MD sent at 11/05/2017  8:43 AM EDT ----- Regarding: PACE program What do I need to do to get PACE - to evaluate pt.  She is agreeable.  Thanks    Dr Nicki Reaper

## 2017-11-08 ENCOUNTER — Ambulatory Visit (INDEPENDENT_AMBULATORY_CARE_PROVIDER_SITE_OTHER): Payer: Medicare Other | Admitting: Internal Medicine

## 2017-11-08 ENCOUNTER — Encounter: Payer: Self-pay | Admitting: Internal Medicine

## 2017-11-08 VITALS — BP 128/74 | HR 87 | Temp 97.4°F | Ht 63.0 in | Wt 102.8 lb

## 2017-11-08 DIAGNOSIS — L299 Pruritus, unspecified: Secondary | ICD-10-CM

## 2017-11-08 DIAGNOSIS — Z659 Problem related to unspecified psychosocial circumstances: Secondary | ICD-10-CM | POA: Diagnosis not present

## 2017-11-08 DIAGNOSIS — R21 Rash and other nonspecific skin eruption: Secondary | ICD-10-CM | POA: Diagnosis not present

## 2017-11-08 MED ORDER — CLOBETASOL PROP EMOLLIENT BASE 0.05 % EX CREA: 1.0000 "application " | TOPICAL_CREAM | Freq: Two times a day (BID) | CUTANEOUS | 0 refills | Status: DC

## 2017-11-08 MED ORDER — PERMETHRIN 5 % EX CREA: 1.0000 "application " | TOPICAL_CREAM | Freq: Once | CUTANEOUS | 0 refills | Status: AC

## 2017-11-08 MED ORDER — TRIAMCINOLONE ACETONIDE 0.1 % EX CREA: 1.0000 "application " | TOPICAL_CREAM | Freq: Two times a day (BID) | CUTANEOUS | 1 refills | Status: DC

## 2017-11-08 MED ORDER — HYDROXYZINE HCL 10 MG PO TABS: 10.0000 mg | ORAL_TABLET | Freq: Two times a day (BID) | ORAL | 1 refills | Status: DC | PRN

## 2017-11-08 NOTE — Patient Instructions (Addendum)
Use Permethrin topically 1st entire body leave on for 8 to 14 hours  Try Sarna lotion for itching  Use clobetasol and Triamcinolone steroid creams for itching clobetasol is the stronger steroid cream you can use up to 2x per day  Try Atarax pill for itching-initially try at night if needed up to 2x per day  Follow up in 1 week   Bullous Pemphigoid Bullous pemphigoid is a skin disease that causes blisters to form. It ranges in severity and can last for a long time. The disease can come back months or years after it goes away. What are the causes? The cause of this condition is not known. Certain medicines and conditions, such as diabetes and multiple sclerosis, may be causes. Bullous pemphigoid is an autoimmune disease. This means that the body's own defense system (immune system) attacks the body. What increases the risk? This condition is more likely to develop in people over the age of 15. What are the signs or symptoms? This condition causes blisters to form on the skin. In mild cases, only a few small blisters form. In severe cases, many large blisters form in several areas of the body. The most common places blisters form are the groin, armpits, trunk, thighs, and forearms. About one third of people with this condition develop blisters in the mouth. The blisters may break open, forming ulcers. Other symptoms of this condition include:  Redness.  Irritation.  Itching.  Bleeding gums.  Difficulty eating.  Cough.  Pain with swallowing.  Nosebleeds.  In most people, the symptoms of bullous pemphigoid go away within 5 years. How is this diagnosed? This condition may be diagnosed with a physical exam and blood tests. A procedure in which a skin sample is taken for testing (skin biopsy) may be done to confirm the diagnosis. How is this treated? This condition may be managed with medicines, such as:  Antibiotic medicines.  Steroid medicines. These may be applied to the skin,  taken by mouth, or given as injections.  Medicines that suppress the immune system.  If the mouth or lips are affected, a change in diet may also be recommended. People with severe symptoms may need to be treated at a hospital, where they may receive:  Care for their ulcers.  Medicines given through an IV tube.  Feedings given through an IV tube. This may be done if the mouth or lips are affected.  Follow these instructions at home:  Take medicines only as directed by your health care provider.  Keep your skin clean.  Do not scratch, break, or drain your blisters. Doing so can cause them to become infected.  Follow your health care provider's instructions about bandage (dressing) changes and removal.  If your mouth or lips are affected: ? Eat a diet made up of soft foods and liquids only. ? Avoid drinking very hot liquids. Contact a health care provider if:  Your itching or pain is not helped by medicine.  You develop redness, swelling, or pain that extends beyond your blisters or ulcers.  There is pus coming from a blister or ulcer.  You have a fever.  You have confusion.  You feel unusually tired or weak. Get help right away if:  Your pain becomes severe.  You cannot eat or drink because of blisters, ulcers, or pain in your lips or mouth.  You cannot care for yourself because of blisters, ulcers, or pain in your hands or in the soles of your feet. This information is  not intended to replace advice given to you by your health care provider. Make sure you discuss any questions you have with your health care provider. Document Released: 05/15/2007 Document Revised: 12/24/2015 Document Reviewed: 07/14/2014 Elsevier Interactive Patient Education  [employer].

## 2017-11-08 NOTE — Progress Notes (Signed)
Pre visit review using our clinic review tool, if applicable. No additional management support is needed unless otherwise documented below in the visit note. 

## 2017-11-10 ENCOUNTER — Encounter: Payer: Self-pay | Admitting: Internal Medicine

## 2017-11-10 DIAGNOSIS — H5211 Myopia, right eye: Secondary | ICD-10-CM | POA: Diagnosis not present

## 2017-11-10 DIAGNOSIS — H353132 Nonexudative age-related macular degeneration, bilateral, intermediate dry stage: Secondary | ICD-10-CM | POA: Diagnosis not present

## 2017-11-10 DIAGNOSIS — H401113 Primary open-angle glaucoma, right eye, severe stage: Secondary | ICD-10-CM | POA: Diagnosis not present

## 2017-11-10 DIAGNOSIS — H401123 Primary open-angle glaucoma, left eye, severe stage: Secondary | ICD-10-CM | POA: Diagnosis not present

## 2017-11-10 DIAGNOSIS — H1820 Unspecified corneal edema: Secondary | ICD-10-CM | POA: Diagnosis not present

## 2017-11-10 DIAGNOSIS — G43B0 Ophthalmoplegic migraine, not intractable: Secondary | ICD-10-CM | POA: Diagnosis not present

## 2017-11-10 DIAGNOSIS — H1089 Other conjunctivitis: Secondary | ICD-10-CM | POA: Diagnosis not present

## 2017-11-10 DIAGNOSIS — H524 Presbyopia: Secondary | ICD-10-CM | POA: Diagnosis not present

## 2017-11-10 DIAGNOSIS — H52221 Regular astigmatism, right eye: Secondary | ICD-10-CM | POA: Diagnosis not present

## 2017-11-10 DIAGNOSIS — H47233 Glaucomatous optic atrophy, bilateral: Secondary | ICD-10-CM | POA: Diagnosis not present

## 2017-11-10 DIAGNOSIS — H04123 Dry eye syndrome of bilateral lacrimal glands: Secondary | ICD-10-CM | POA: Diagnosis not present

## 2017-11-10 DIAGNOSIS — H18832 Recurrent erosion of cornea, left eye: Secondary | ICD-10-CM | POA: Diagnosis not present

## 2017-11-10 NOTE — Progress Notes (Signed)
Chief Complaint  Patient presents with  . Rash   Acute visit with friend Pamala Hurry  1. Rash x 1 week on back, lower groin arms that is itchy nothing tried but very itchy for patient. She is not sure of cause. No new products used.  2. Social issues lives alone age 84 y.o friend Pamala Hurry just had lung surgery and has trouble helping pt she has other people who help her but no family in the area and Pamala Hurry states this is getting too much. Pt unable to do IADLs and ADLs and needs help. Pamala Hurry wants to know the next step   Review of Systems  Constitutional: Negative for weight loss.  HENT: Positive for hearing loss.   Eyes: Negative for blurred vision.  Respiratory: Negative for shortness of breath.   Cardiovascular: Negative for chest pain.  Skin: Positive for itching and rash.   Past Medical History:  Diagnosis Date  . Barrett's esophagus with esophagitis   . Chronic bronchitis ([place])   . GERD (gastroesophageal reflux disease)   . Glaucoma   . Hypercholesteremia   . Osteoarthritis   . Osteoporosis   . Sleep apnea    has CPAP  . TIA (transient ischemic attack)    Past Surgical History:  Procedure Laterality Date  . BUNIONECTOMY     left  . ROTATOR CUFF REPAIR     right   Family History  Problem Relation Age of Onset  . Breast cancer Sister   . Heart disease Mother    Social History   Socioeconomic History  . Marital status: Single    Spouse name: Not on file  . Number of children: Not on file  . Years of education: Not on file  . Highest education level: Not on file  Occupational History  . Not on file  Social Needs  . Financial resource strain: Not on file  . Food insecurity:    Worry: Not on file    Inability: Not on file  . Transportation needs:    Medical: Not on file    Non-medical: Not on file  Tobacco Use  . Smoking status: Never Smoker  . Smokeless tobacco: Never Used  Substance and Sexual Activity  . Alcohol use: Yes    Alcohol/week: 0.0 oz     Comment: wine/brandy occasional  . Drug use: No  . Sexual activity: Never  Lifestyle  . Physical activity:    Days per week: 7 days    Minutes per session: 40 min  . Stress: Only a little  Relationships  . Social connections:    Talks on phone: Patient refused    Gets together: Patient refused    Attends religious service: Patient refused    Active member of club or organization: Patient refused    Attends meetings of clubs or organizations: Patient refused    Relationship status: Patient refused  . Intimate partner violence:    Fear of current or ex partner: Patient refused    Emotionally abused: Patient refused    Physically abused: Patient refused    Forced sexual activity: Patient refused  Other Topics Concern  . Not on file  Social History Narrative   Lives alone no kids   Current Meds  Medication Sig  . Hypochlorous Acid 0.012 % SOLN Apply topically.   Allergies  Allergen Reactions  . Alphagan [Brimonidine]     Burning, stinging, bloody eye, dry mouth  . Avelox [Moxifloxacin Hcl In Nacl] Other (See Comments)    Dizziness,  weakness  . Bacitracin     Tearing, itching, swelling, redness  . Cefdinir Nausea And Vomiting  . Cephalexin     HA, nausea, gas, abdominal pain  . Clarithromycin     HA, nausea, gas, abdominal pain  . Codeine     HA, nausea, gas, abdominal pain  . Erythromycin     HA, nausea, gas, abdominal pain  . Hydrocodone-Acetaminophen     REACTION: chest pain, constipation, nausea  . Lansoprazole     REACTION: cramps, nausea, diarrhea  . Polymyxin B     Itching and redness  . Vigamox [Moxifloxacin] Itching    Soreness, irritability, loss of appetite,  Dizziness, diarrhea, muscle weakness   Recent Results (from the past 2160 hour(s))  CBC with Differential/Platelet     Status: None   Collection Time: 08/30/17 12:31 PM  Result Value Ref Range   WBC 8.4 4.0 - 10.5 K/uL   RBC 4.46 3.87 - 5.11 Mil/uL   Hemoglobin 13.5 12.0 - 15.0 g/dL   HCT 41.1  36.0 - 46.0 %   MCV 92.2 78.0 - 100.0 fl   MCHC 32.9 30.0 - 36.0 g/dL   RDW 13.8 11.5 - 15.5 %   Platelets 281.0 150.0 - 400.0 K/uL   Neutrophils Relative % 70.2 43.0 - 77.0 %   Lymphocytes Relative 18.6 12.0 - 46.0 %   Monocytes Relative 8.5 3.0 - 12.0 %   Eosinophils Relative 1.5 0.0 - 5.0 %   Basophils Relative 1.2 0.0 - 3.0 %   Neutro Abs 5.9 1.4 - 7.7 K/uL   Lymphs Abs 1.6 0.7 - 4.0 K/uL   Monocytes Absolute 0.7 0.1 - 1.0 K/uL   Eosinophils Absolute 0.1 0.0 - 0.7 K/uL   Basophils Absolute 0.1 0.0 - 0.1 K/uL  Comprehensive metabolic panel     Status: None   Collection Time: 08/30/17 12:31 PM  Result Value Ref Range   Sodium 139 135 - 145 mEq/L   Potassium 4.5 3.5 - 5.1 mEq/L   Chloride 103 96 - 112 mEq/L   CO2 31 19 - 32 mEq/L   Glucose, Bld 87 70 - 99 mg/dL   BUN 23 6 - 23 mg/dL   Creatinine, Ser 0.82 0.40 - 1.20 mg/dL   Total Bilirubin 0.4 0.2 - 1.2 mg/dL   Alkaline Phosphatase 48 39 - 117 U/L   AST 18 0 - 37 U/L   ALT 14 0 - 35 U/L   Total Protein 7.0 6.0 - 8.3 g/dL   Albumin 4.2 3.5 - 5.2 g/dL   Calcium 9.5 8.4 - 10.5 mg/dL   GFR 68.80 >60.00 mL/min  TSH     Status: None   Collection Time: 08/30/17 12:31 PM  Result Value Ref Range   TSH 1.47 0.35 - 4.50 uIU/mL  Urinalysis, Routine w reflex microscopic     Status: Abnormal   Collection Time: 08/30/17 12:31 PM  Result Value Ref Range   Color, Urine YELLOW Yellow;Lt. Yellow   APPearance CLEAR Clear   Specific Gravity, Urine 1.025 1.000 - 1.030   pH 6.0 5.0 - 8.0   Total Protein, Urine 30 (A) Negative   Urine Glucose NEGATIVE Negative   Ketones, ur TRACE (A) Negative   Bilirubin Urine NEGATIVE Negative   Hgb urine dipstick NEGATIVE Negative   Urobilinogen, UA 0.2 0.0 - 1.0   Leukocytes, UA NEGATIVE Negative   Nitrite NEGATIVE Negative   WBC, UA 0-2/hpf 0-2/hpf   RBC / HPF none seen 0-2/hpf   Mucus,  UA Presence of (A) None   Squamous Epithelial / LPF Rare(0-4/hpf) Rare(0-4/hpf)  Urine Culture     Status:  None   Collection Time: 08/30/17 12:31 PM  Result Value Ref Range   MICRO NUMBER: [id number]    SPECIMEN QUALITY: ADEQUATE    Sample Source URINE    STATUS: FINAL    Result: No Growth   Lipase, blood     Status: None   Collection Time: 11/01/17  6:53 AM  Result Value Ref Range   Lipase 24 11 - 51 U/L    Comment: Performed at [facility], [address] [place], [place] [address]  Comprehensive metabolic panel     Status: Abnormal   Collection Time: 11/01/17  6:53 AM  Result Value Ref Range   Sodium 138 135 - 145 mmol/L   Potassium 3.2 (L) 3.5 - 5.1 mmol/L   Chloride 102 101 - 111 mmol/L   CO2 24 22 - 32 mmol/L   Glucose, Bld 156 (H) 65 - 99 mg/dL   BUN 34 (H) 6 - 20 mg/dL   Creatinine, Ser 1.09 (H) 0.44 - 1.00 mg/dL   Calcium 8.9 8.9 - 10.3 mg/dL   Total Protein 7.1 6.5 - 8.1 g/dL   Albumin 4.1 3.5 - 5.0 g/dL   AST 29 15 - 41 U/L   ALT 17 14 - 54 U/L   Alkaline Phosphatase 54 38 - 126 U/L   Total Bilirubin 1.3 (H) 0.3 - 1.2 mg/dL   GFR calc non Af Amer 42 (L) >60 mL/min   GFR calc Af Amer 48 (L) >60 mL/min    Comment: (NOTE) The eGFR has been calculated using the CKD EPI equation. This calculation has not been validated in all clinical situations. eGFR's persistently <60 mL/min signify possible Chronic Kidney Disease.    Anion gap 12 5 - 15    Comment: Performed at [facility], [address] [place], [place] [address]  CBC     Status: Abnormal   Collection Time: 11/01/17  6:53 AM  Result Value Ref Range   WBC 12.1 (H) 3.6 - 11.0 K/uL   RBC 4.56 3.80 - 5.20 MIL/uL   Hemoglobin 14.0 12.0 - 16.0 g/dL   HCT 42.0 35.0 - 47.0 %   MCV 92.2 80.0 - 100.0 fL   MCH 30.7 26.0 - 34.0 pg   MCHC 33.3 32.0 - 36.0 g/dL   RDW 14.5 11.5 - 14.5 %   Platelets 257 150 - 440 K/uL    Comment: Performed at [facility], [address] [place], [place] [address]  Urinalysis, Complete w Microscopic     Status: Abnormal   Collection Time: 11/01/17  8:14 AM   Result Value Ref Range   Color, Urine YELLOW (A) YELLOW   APPearance CLEAR (A) CLEAR   Specific Gravity, Urine 1.025 1.005 - 1.030   pH 5.0 5.0 - 8.0   Glucose, UA NEGATIVE NEGATIVE mg/dL   Hgb urine dipstick NEGATIVE NEGATIVE   Bilirubin Urine NEGATIVE NEGATIVE   Ketones, ur 80 (A) NEGATIVE mg/dL   Protein, ur 30 (A) NEGATIVE mg/dL   Nitrite NEGATIVE NEGATIVE   Leukocytes, UA NEGATIVE NEGATIVE   RBC / HPF 0-5 0 - 5 RBC/hpf   WBC, UA 0-5 0 - 5 WBC/hpf   Bacteria, UA NONE SEEN NONE SEEN   Squamous Epithelial / LPF 0-5 (A) NONE SEEN   Mucus PRESENT     Comment: Performed at [facility], [address] [place], [place] [address]  CBC with Differential/Platelet     Status: Abnormal   Collection Time: 11/02/17 10:05 AM  Result Value Ref Range   WBC 8.6 4.0 - 10.5 K/uL   RBC 4.26 3.87 - 5.11 Mil/uL   Hemoglobin 13.1 12.0 - 15.0 g/dL   HCT 39.1 36.0 - 46.0 %   MCV 91.6 78.0 - 100.0 fl   MCHC 33.5 30.0 - 36.0 g/dL   RDW 14.8 11.5 - 15.5 %   Platelets 263.0 150.0 - 400.0 K/uL   Neutrophils Relative % 76.0 43.0 - 77.0 %   Lymphocytes Relative 6.6 Repeated and verified X2. (L) 12.0 - 46.0 %   Monocytes Relative 8.7 3.0 - 12.0 %   Eosinophils Relative 8.2 (H) 0.0 - 5.0 %   Basophils Relative 0.5 0.0 - 3.0 %   Neutro Abs 6.5 1.4 - 7.7 K/uL   Lymphs Abs 0.6 (L) 0.7 - 4.0 K/uL   Monocytes Absolute 0.8 0.1 - 1.0 K/uL   Eosinophils Absolute 0.7 0.0 - 0.7 K/uL   Basophils Absolute 0.0 0.0 - 0.1 K/uL  Basic metabolic panel     Status: Abnormal   Collection Time: 11/02/17 10:05 AM  Result Value Ref Range   Sodium 139 135 - 145 mEq/L   Potassium 3.5 3.5 - 5.1 mEq/L   Chloride 104 96 - 112 mEq/L   CO2 27 19 - 32 mEq/L   Glucose, Bld 97 70 - 99 mg/dL   BUN 26 (H) 6 - 23 mg/dL   Creatinine, Ser 1.00 0.40 - 1.20 mg/dL   Calcium 8.7 8.4 - 10.5 mg/dL   GFR 54.70 (L) >60.00 mL/min  Hepatic function panel     Status: None   Collection Time: 11/02/17 10:05 AM  Result Value Ref  Range   Total Bilirubin 0.7 0.2 - 1.2 mg/dL   Bilirubin, Direct 0.1 0.0 - 0.3 mg/dL   Alkaline Phosphatase 44 39 - 117 U/L   AST 19 0 - 37 U/L   ALT 15 0 - 35 U/L   Total Protein 6.4 6.0 - 8.3 g/dL   Albumin 3.5 3.5 - 5.2 g/dL   Objective  Body mass index is 18.21 kg/m. Wt Readings from Last 3 Encounters:  11/08/17 102 lb 12.8 oz (46.6 kg)  11/02/17 99 lb 9.6 oz (45.2 kg)  11/01/17 110 lb (49.9 kg)   Temp Readings from Last 3 Encounters:  11/08/17 (!) 97.4 F (36.3 C) (Oral)  11/02/17 (!) 97.4 F (36.3 C) (Oral)  11/01/17 99.5 F (37.5 C) (Oral)   BP Readings from Last 3 Encounters:  11/08/17 128/74  11/02/17 (!) 114/58  11/01/17 132/70   Pulse Readings from Last 3 Encounters:  11/08/17 87  11/02/17 70  11/01/17 (!) 114    Physical Exam  Constitutional: She is oriented to person, place, and time. Vital signs are normal. She appears well-developed and well-nourished.  HENT:  Head: Normocephalic and atraumatic.  Mouth/Throat: Oropharynx is clear and moist and mucous membranes are normal.  Eyes: Pupils are equal, round, and reactive to light. Conjunctivae are normal.  Cardiovascular: Normal rate, regular rhythm and normal heart sounds.  Pulmonary/Chest: Effort normal and breath sounds normal.  Neurological: She is alert and oriented to person, place, and time. Gait normal.  Skin: Skin is warm and dry. Rash noted.  Diffuse erythematous pruritic rash to back, arms, right lower groin Rash papular with ulcerations  Psychiatric: She has a normal mood and affect. Her speech is normal and behavior is  normal. Judgment and thought content normal.  Nursing note and vitals reviewed.   Assessment   1. Rash ddx possibly bullous pemphigoid >scabies vs eczema ? Etiology  2. Social issues unable to do ADLS and IADLS with no family support currently  Plan  1.  tx with Premethrin 1st as to tx scabies tonight Then use TMC +/- Clobetasol on most itchy places Clobetasol Tx  Bullous  pemp.  Atarax 10 mg qhs prn to bid prn  Also can try sarna lotion  May need back applicator  F/u in 1 week withPCP  2. PCP working on [facility] also disc DSS or APS may need to be involved   Provider: Dr. Olivia Mackie McLean-Scocuzza-Internal Medicine

## 2017-11-13 ENCOUNTER — Other Ambulatory Visit (INDEPENDENT_AMBULATORY_CARE_PROVIDER_SITE_OTHER): Payer: Medicare Other

## 2017-11-13 DIAGNOSIS — G43B0 Ophthalmoplegic migraine, not intractable: Secondary | ICD-10-CM | POA: Diagnosis not present

## 2017-11-13 DIAGNOSIS — H401113 Primary open-angle glaucoma, right eye, severe stage: Secondary | ICD-10-CM | POA: Diagnosis not present

## 2017-11-13 DIAGNOSIS — H1089 Other conjunctivitis: Secondary | ICD-10-CM | POA: Diagnosis not present

## 2017-11-13 DIAGNOSIS — H353132 Nonexudative age-related macular degeneration, bilateral, intermediate dry stage: Secondary | ICD-10-CM | POA: Diagnosis not present

## 2017-11-13 DIAGNOSIS — D72810 Lymphocytopenia: Secondary | ICD-10-CM

## 2017-11-13 DIAGNOSIS — H524 Presbyopia: Secondary | ICD-10-CM | POA: Diagnosis not present

## 2017-11-13 DIAGNOSIS — H1820 Unspecified corneal edema: Secondary | ICD-10-CM | POA: Diagnosis not present

## 2017-11-13 DIAGNOSIS — H52221 Regular astigmatism, right eye: Secondary | ICD-10-CM | POA: Diagnosis not present

## 2017-11-13 DIAGNOSIS — H47233 Glaucomatous optic atrophy, bilateral: Secondary | ICD-10-CM | POA: Diagnosis not present

## 2017-11-13 DIAGNOSIS — H401123 Primary open-angle glaucoma, left eye, severe stage: Secondary | ICD-10-CM | POA: Diagnosis not present

## 2017-11-13 DIAGNOSIS — H5211 Myopia, right eye: Secondary | ICD-10-CM | POA: Diagnosis not present

## 2017-11-13 DIAGNOSIS — H04123 Dry eye syndrome of bilateral lacrimal glands: Secondary | ICD-10-CM | POA: Diagnosis not present

## 2017-11-13 DIAGNOSIS — H18832 Recurrent erosion of cornea, left eye: Secondary | ICD-10-CM | POA: Diagnosis not present

## 2017-11-13 LAB — CBC WITH DIFFERENTIAL/PLATELET
Basophils Absolute: 0.2 10*3/uL — ABNORMAL HIGH (ref 0.0–0.1)
Basophils Relative: 2.4 % (ref 0.0–3.0)
EOS PCT: 6.8 % — AB (ref 0.0–5.0)
Eosinophils Absolute: 0.6 10*3/uL (ref 0.0–0.7)
HCT: 38.7 % (ref 36.0–46.0)
HEMOGLOBIN: 12.8 g/dL (ref 12.0–15.0)
LYMPHS ABS: 1.4 10*3/uL (ref 0.7–4.0)
Lymphocytes Relative: 17.1 % (ref 12.0–46.0)
MCHC: 33.2 g/dL (ref 30.0–36.0)
MCV: 91.6 fl (ref 78.0–100.0)
Monocytes Absolute: 0.6 10*3/uL (ref 0.1–1.0)
Monocytes Relative: 7.7 % (ref 3.0–12.0)
Neutro Abs: 5.5 10*3/uL (ref 1.4–7.7)
Neutrophils Relative %: 66 % (ref 43.0–77.0)
Platelets: 348 10*3/uL (ref 150.0–400.0)
RBC: 4.22 Mil/uL (ref 3.87–5.11)
RDW: 14.8 % (ref 11.5–15.5)
WBC: 8.3 10*3/uL (ref 4.0–10.5)

## 2017-11-14 ENCOUNTER — Other Ambulatory Visit: Payer: Medicare Other

## 2017-11-14 DIAGNOSIS — H1820 Unspecified corneal edema: Secondary | ICD-10-CM | POA: Diagnosis not present

## 2017-11-14 DIAGNOSIS — H04123 Dry eye syndrome of bilateral lacrimal glands: Secondary | ICD-10-CM | POA: Diagnosis not present

## 2017-11-14 DIAGNOSIS — G43B0 Ophthalmoplegic migraine, not intractable: Secondary | ICD-10-CM | POA: Diagnosis not present

## 2017-11-14 DIAGNOSIS — H52221 Regular astigmatism, right eye: Secondary | ICD-10-CM | POA: Diagnosis not present

## 2017-11-14 DIAGNOSIS — H47233 Glaucomatous optic atrophy, bilateral: Secondary | ICD-10-CM | POA: Diagnosis not present

## 2017-11-14 DIAGNOSIS — H401123 Primary open-angle glaucoma, left eye, severe stage: Secondary | ICD-10-CM | POA: Diagnosis not present

## 2017-11-14 DIAGNOSIS — H353132 Nonexudative age-related macular degeneration, bilateral, intermediate dry stage: Secondary | ICD-10-CM | POA: Diagnosis not present

## 2017-11-14 DIAGNOSIS — H1089 Other conjunctivitis: Secondary | ICD-10-CM | POA: Diagnosis not present

## 2017-11-14 DIAGNOSIS — H5211 Myopia, right eye: Secondary | ICD-10-CM | POA: Diagnosis not present

## 2017-11-14 DIAGNOSIS — H524 Presbyopia: Secondary | ICD-10-CM | POA: Diagnosis not present

## 2017-11-14 DIAGNOSIS — H401113 Primary open-angle glaucoma, right eye, severe stage: Secondary | ICD-10-CM | POA: Diagnosis not present

## 2017-11-14 DIAGNOSIS — H18832 Recurrent erosion of cornea, left eye: Secondary | ICD-10-CM | POA: Diagnosis not present

## 2017-11-20 ENCOUNTER — Telehealth: Payer: Self-pay | Admitting: Internal Medicine

## 2017-11-20 NOTE — Telephone Encounter (Signed)
Patient notified of lab results.  Not in basket.

## 2017-11-21 DIAGNOSIS — M79675 Pain in left toe(s): Secondary | ICD-10-CM | POA: Diagnosis not present

## 2017-11-21 DIAGNOSIS — M79674 Pain in right toe(s): Secondary | ICD-10-CM | POA: Diagnosis not present

## 2017-11-21 DIAGNOSIS — B351 Tinea unguium: Secondary | ICD-10-CM | POA: Diagnosis not present

## 2017-11-21 DIAGNOSIS — L600 Ingrowing nail: Secondary | ICD-10-CM | POA: Diagnosis not present

## 2017-11-24 DIAGNOSIS — H1089 Other conjunctivitis: Secondary | ICD-10-CM | POA: Diagnosis not present

## 2017-11-24 DIAGNOSIS — H5211 Myopia, right eye: Secondary | ICD-10-CM | POA: Diagnosis not present

## 2017-11-24 DIAGNOSIS — H401113 Primary open-angle glaucoma, right eye, severe stage: Secondary | ICD-10-CM | POA: Diagnosis not present

## 2017-11-24 DIAGNOSIS — H1820 Unspecified corneal edema: Secondary | ICD-10-CM | POA: Diagnosis not present

## 2017-11-24 DIAGNOSIS — H18832 Recurrent erosion of cornea, left eye: Secondary | ICD-10-CM | POA: Diagnosis not present

## 2017-11-24 DIAGNOSIS — G43B0 Ophthalmoplegic migraine, not intractable: Secondary | ICD-10-CM | POA: Diagnosis not present

## 2017-11-24 DIAGNOSIS — H47233 Glaucomatous optic atrophy, bilateral: Secondary | ICD-10-CM | POA: Diagnosis not present

## 2017-11-24 DIAGNOSIS — H353132 Nonexudative age-related macular degeneration, bilateral, intermediate dry stage: Secondary | ICD-10-CM | POA: Diagnosis not present

## 2017-11-24 DIAGNOSIS — H52221 Regular astigmatism, right eye: Secondary | ICD-10-CM | POA: Diagnosis not present

## 2017-11-24 DIAGNOSIS — H524 Presbyopia: Secondary | ICD-10-CM | POA: Diagnosis not present

## 2017-11-24 DIAGNOSIS — H401123 Primary open-angle glaucoma, left eye, severe stage: Secondary | ICD-10-CM | POA: Diagnosis not present

## 2017-11-24 DIAGNOSIS — H04123 Dry eye syndrome of bilateral lacrimal glands: Secondary | ICD-10-CM | POA: Diagnosis not present

## 2017-11-29 DIAGNOSIS — H401123 Primary open-angle glaucoma, left eye, severe stage: Secondary | ICD-10-CM | POA: Diagnosis not present

## 2017-11-29 DIAGNOSIS — H524 Presbyopia: Secondary | ICD-10-CM | POA: Diagnosis not present

## 2017-11-29 DIAGNOSIS — H401113 Primary open-angle glaucoma, right eye, severe stage: Secondary | ICD-10-CM | POA: Diagnosis not present

## 2017-11-29 DIAGNOSIS — H5211 Myopia, right eye: Secondary | ICD-10-CM | POA: Diagnosis not present

## 2017-11-29 DIAGNOSIS — G43B0 Ophthalmoplegic migraine, not intractable: Secondary | ICD-10-CM | POA: Diagnosis not present

## 2017-11-29 DIAGNOSIS — H18832 Recurrent erosion of cornea, left eye: Secondary | ICD-10-CM | POA: Diagnosis not present

## 2017-11-29 DIAGNOSIS — H353132 Nonexudative age-related macular degeneration, bilateral, intermediate dry stage: Secondary | ICD-10-CM | POA: Diagnosis not present

## 2017-11-29 DIAGNOSIS — H1089 Other conjunctivitis: Secondary | ICD-10-CM | POA: Diagnosis not present

## 2017-11-29 DIAGNOSIS — H52221 Regular astigmatism, right eye: Secondary | ICD-10-CM | POA: Diagnosis not present

## 2017-11-29 DIAGNOSIS — H1820 Unspecified corneal edema: Secondary | ICD-10-CM | POA: Diagnosis not present

## 2017-11-29 DIAGNOSIS — H04123 Dry eye syndrome of bilateral lacrimal glands: Secondary | ICD-10-CM | POA: Diagnosis not present

## 2017-11-29 DIAGNOSIS — H47233 Glaucomatous optic atrophy, bilateral: Secondary | ICD-10-CM | POA: Diagnosis not present

## 2017-12-08 DIAGNOSIS — H52221 Regular astigmatism, right eye: Secondary | ICD-10-CM | POA: Diagnosis not present

## 2017-12-08 DIAGNOSIS — H524 Presbyopia: Secondary | ICD-10-CM | POA: Diagnosis not present

## 2017-12-08 DIAGNOSIS — H04123 Dry eye syndrome of bilateral lacrimal glands: Secondary | ICD-10-CM | POA: Diagnosis not present

## 2017-12-08 DIAGNOSIS — H18832 Recurrent erosion of cornea, left eye: Secondary | ICD-10-CM | POA: Diagnosis not present

## 2017-12-08 DIAGNOSIS — H401123 Primary open-angle glaucoma, left eye, severe stage: Secondary | ICD-10-CM | POA: Diagnosis not present

## 2017-12-08 DIAGNOSIS — G43B0 Ophthalmoplegic migraine, not intractable: Secondary | ICD-10-CM | POA: Diagnosis not present

## 2017-12-08 DIAGNOSIS — H1820 Unspecified corneal edema: Secondary | ICD-10-CM | POA: Diagnosis not present

## 2017-12-08 DIAGNOSIS — H35363 Drusen (degenerative) of macula, bilateral: Secondary | ICD-10-CM | POA: Diagnosis not present

## 2017-12-08 DIAGNOSIS — H5211 Myopia, right eye: Secondary | ICD-10-CM | POA: Diagnosis not present

## 2017-12-08 DIAGNOSIS — H353132 Nonexudative age-related macular degeneration, bilateral, intermediate dry stage: Secondary | ICD-10-CM | POA: Diagnosis not present

## 2017-12-08 DIAGNOSIS — H47233 Glaucomatous optic atrophy, bilateral: Secondary | ICD-10-CM | POA: Diagnosis not present

## 2017-12-08 DIAGNOSIS — H401113 Primary open-angle glaucoma, right eye, severe stage: Secondary | ICD-10-CM | POA: Diagnosis not present

## 2017-12-11 DIAGNOSIS — H6123 Impacted cerumen, bilateral: Secondary | ICD-10-CM | POA: Diagnosis not present

## 2017-12-11 DIAGNOSIS — H903 Sensorineural hearing loss, bilateral: Secondary | ICD-10-CM | POA: Diagnosis not present

## 2017-12-14 DIAGNOSIS — C44729 Squamous cell carcinoma of skin of left lower limb, including hip: Secondary | ICD-10-CM | POA: Diagnosis not present

## 2017-12-14 DIAGNOSIS — D485 Neoplasm of uncertain behavior of skin: Secondary | ICD-10-CM | POA: Diagnosis not present

## 2017-12-15 DIAGNOSIS — H04123 Dry eye syndrome of bilateral lacrimal glands: Secondary | ICD-10-CM | POA: Diagnosis not present

## 2017-12-15 DIAGNOSIS — G43B0 Ophthalmoplegic migraine, not intractable: Secondary | ICD-10-CM | POA: Diagnosis not present

## 2017-12-15 DIAGNOSIS — H53121 Transient visual loss, right eye: Secondary | ICD-10-CM | POA: Diagnosis not present

## 2017-12-15 DIAGNOSIS — H1820 Unspecified corneal edema: Secondary | ICD-10-CM | POA: Diagnosis not present

## 2017-12-15 DIAGNOSIS — H47233 Glaucomatous optic atrophy, bilateral: Secondary | ICD-10-CM | POA: Diagnosis not present

## 2017-12-15 DIAGNOSIS — H353132 Nonexudative age-related macular degeneration, bilateral, intermediate dry stage: Secondary | ICD-10-CM | POA: Diagnosis not present

## 2017-12-15 DIAGNOSIS — H524 Presbyopia: Secondary | ICD-10-CM | POA: Diagnosis not present

## 2017-12-15 DIAGNOSIS — H52221 Regular astigmatism, right eye: Secondary | ICD-10-CM | POA: Diagnosis not present

## 2017-12-15 DIAGNOSIS — H35363 Drusen (degenerative) of macula, bilateral: Secondary | ICD-10-CM | POA: Diagnosis not present

## 2017-12-15 DIAGNOSIS — H401123 Primary open-angle glaucoma, left eye, severe stage: Secondary | ICD-10-CM | POA: Diagnosis not present

## 2017-12-15 DIAGNOSIS — H401113 Primary open-angle glaucoma, right eye, severe stage: Secondary | ICD-10-CM | POA: Diagnosis not present

## 2017-12-15 DIAGNOSIS — H5211 Myopia, right eye: Secondary | ICD-10-CM | POA: Diagnosis not present

## 2017-12-18 DIAGNOSIS — H401113 Primary open-angle glaucoma, right eye, severe stage: Secondary | ICD-10-CM | POA: Diagnosis not present

## 2017-12-18 DIAGNOSIS — H52223 Regular astigmatism, bilateral: Secondary | ICD-10-CM | POA: Diagnosis not present

## 2017-12-18 DIAGNOSIS — H18832 Recurrent erosion of cornea, left eye: Secondary | ICD-10-CM | POA: Diagnosis not present

## 2017-12-18 DIAGNOSIS — H5213 Myopia, bilateral: Secondary | ICD-10-CM | POA: Diagnosis not present

## 2017-12-18 DIAGNOSIS — H04123 Dry eye syndrome of bilateral lacrimal glands: Secondary | ICD-10-CM | POA: Diagnosis not present

## 2017-12-18 DIAGNOSIS — H35363 Drusen (degenerative) of macula, bilateral: Secondary | ICD-10-CM | POA: Diagnosis not present

## 2017-12-18 DIAGNOSIS — H401123 Primary open-angle glaucoma, left eye, severe stage: Secondary | ICD-10-CM | POA: Diagnosis not present

## 2017-12-18 DIAGNOSIS — H1820 Unspecified corneal edema: Secondary | ICD-10-CM | POA: Diagnosis not present

## 2017-12-18 DIAGNOSIS — G43B0 Ophthalmoplegic migraine, not intractable: Secondary | ICD-10-CM | POA: Diagnosis not present

## 2017-12-18 DIAGNOSIS — H53121 Transient visual loss, right eye: Secondary | ICD-10-CM | POA: Diagnosis not present

## 2017-12-18 DIAGNOSIS — H47233 Glaucomatous optic atrophy, bilateral: Secondary | ICD-10-CM | POA: Diagnosis not present

## 2017-12-18 DIAGNOSIS — H353133 Nonexudative age-related macular degeneration, bilateral, advanced atrophic without subfoveal involvement: Secondary | ICD-10-CM | POA: Diagnosis not present

## 2017-12-21 ENCOUNTER — Telehealth: Payer: Self-pay | Admitting: Internal Medicine

## 2017-12-21 NOTE — Telephone Encounter (Signed)
Pt came in wanting to know if she could have a  Referral to Hospice care.Marland Kitchen

## 2017-12-21 NOTE — Telephone Encounter (Signed)
Is [facility] still going out to help her?

## 2017-12-22 NOTE — Telephone Encounter (Signed)
Pt is asking for help.  She stated hospice.  We had discussed home health or [facility].  Can they go back out and evaluated and see if can get her help in the home.

## 2017-12-22 NOTE — Telephone Encounter (Signed)
The friend Pamala Hurry advised [facility] that they wanted an attorney to look into the financials due to making sure patient Assets would be secure , Glenda from [facility] has tried to reach back out to patient and friend, no return call. [facility] is very concerned for patient being in home alone after visit.

## 2017-12-26 ENCOUNTER — Ambulatory Visit (INDEPENDENT_AMBULATORY_CARE_PROVIDER_SITE_OTHER): Payer: Medicare Other | Admitting: Family Medicine

## 2017-12-26 ENCOUNTER — Encounter: Payer: Self-pay | Admitting: Family Medicine

## 2017-12-26 VITALS — BP 116/82 | HR 65 | Temp 97.8°F | Ht 63.0 in | Wt 99.2 lb

## 2017-12-26 DIAGNOSIS — H53121 Transient visual loss, right eye: Secondary | ICD-10-CM | POA: Diagnosis not present

## 2017-12-26 DIAGNOSIS — H401113 Primary open-angle glaucoma, right eye, severe stage: Secondary | ICD-10-CM | POA: Diagnosis not present

## 2017-12-26 DIAGNOSIS — H52223 Regular astigmatism, bilateral: Secondary | ICD-10-CM | POA: Diagnosis not present

## 2017-12-26 DIAGNOSIS — H35363 Drusen (degenerative) of macula, bilateral: Secondary | ICD-10-CM | POA: Diagnosis not present

## 2017-12-26 DIAGNOSIS — H18832 Recurrent erosion of cornea, left eye: Secondary | ICD-10-CM | POA: Diagnosis not present

## 2017-12-26 DIAGNOSIS — H401123 Primary open-angle glaucoma, left eye, severe stage: Secondary | ICD-10-CM | POA: Diagnosis not present

## 2017-12-26 DIAGNOSIS — H1820 Unspecified corneal edema: Secondary | ICD-10-CM | POA: Diagnosis not present

## 2017-12-26 DIAGNOSIS — H5213 Myopia, bilateral: Secondary | ICD-10-CM | POA: Diagnosis not present

## 2017-12-26 DIAGNOSIS — G43B0 Ophthalmoplegic migraine, not intractable: Secondary | ICD-10-CM | POA: Diagnosis not present

## 2017-12-26 DIAGNOSIS — H04123 Dry eye syndrome of bilateral lacrimal glands: Secondary | ICD-10-CM | POA: Diagnosis not present

## 2017-12-26 DIAGNOSIS — H47233 Glaucomatous optic atrophy, bilateral: Secondary | ICD-10-CM | POA: Diagnosis not present

## 2017-12-26 DIAGNOSIS — H353133 Nonexudative age-related macular degeneration, bilateral, advanced atrophic without subfoveal involvement: Secondary | ICD-10-CM | POA: Diagnosis not present

## 2017-12-26 DIAGNOSIS — Z029 Encounter for administrative examinations, unspecified: Secondary | ICD-10-CM

## 2017-12-26 NOTE — Telephone Encounter (Signed)
Coming in to see [place]

## 2017-12-27 ENCOUNTER — Telehealth: Payer: Self-pay | Admitting: Internal Medicine

## 2017-12-27 ENCOUNTER — Ambulatory Visit (INDEPENDENT_AMBULATORY_CARE_PROVIDER_SITE_OTHER): Payer: Medicare Other | Admitting: Internal Medicine

## 2017-12-27 ENCOUNTER — Other Ambulatory Visit: Payer: Self-pay

## 2017-12-27 DIAGNOSIS — E7800 Pure hypercholesterolemia, unspecified: Secondary | ICD-10-CM

## 2017-12-27 DIAGNOSIS — K2270 Barrett's esophagus without dysplasia: Secondary | ICD-10-CM

## 2017-12-27 DIAGNOSIS — K2100 Gastro-esophageal reflux disease with esophagitis, without bleeding: Secondary | ICD-10-CM

## 2017-12-27 DIAGNOSIS — G3184 Mild cognitive impairment, so stated: Secondary | ICD-10-CM | POA: Diagnosis not present

## 2017-12-27 MED ORDER — NYSTATIN 100000 UNIT/GM EX CREA: 1.0000 "application " | TOPICAL_CREAM | Freq: Two times a day (BID) | CUTANEOUS | 0 refills | Status: DC

## 2017-12-27 MED ORDER — NYSTATIN 100000 UNIT/ML MT SUSP: OROMUCOSAL | 0 refills | Status: DC

## 2017-12-27 NOTE — Telephone Encounter (Signed)
Correct rx sent in to [facility]

## 2017-12-27 NOTE — Progress Notes (Signed)
Patient ID: Christy Cisneros, female   DOB: 21-Feb-1920, 84 y.o.   MRN: [id number]   Subjective:    Patient ID: Christy Cisneros, female    DOB: 10/30/1919, 84 y.o.   MRN: [id number]  HPI  Patient with past history of Barrett's, chronic bronchitis, GERD and hypercholesterolemia.  She comes in today to follow up on these issues as well as for a complete physical exam.  She is accompanied by her second cousin Melany Guernsey).  History obtained from both of them.  Concerned regarding her staying by herself.  We discussed this today.  Discussed my concerns regarding her safety.  Discussed assisted living facilities.  Eating. No vomiting.  No swallowing difficulty.  No abdominal pain.  Bowels moving.  No urine change.     Past Medical History:  Diagnosis Date  . Barrett's esophagus with esophagitis   . Chronic bronchitis ([place])   . GERD (gastroesophageal reflux disease)   . Glaucoma   . Hypercholesteremia   . Osteoarthritis   . Osteoporosis   . Sleep apnea    has CPAP  . TIA (transient ischemic attack)    Past Surgical History:  Procedure Laterality Date  . BUNIONECTOMY     left  . ROTATOR CUFF REPAIR     right   Family History  Problem Relation Age of Onset  . Breast cancer Sister   . Heart disease Mother    Social History   Socioeconomic History  . Marital status: Single    Spouse name: Not on file  . Number of children: Not on file  . Years of education: Not on file  . Highest education level: Not on file  Occupational History  . Not on file  Social Needs  . Financial resource strain: Not on file  . Food insecurity:    Worry: Not on file    Inability: Not on file  . Transportation needs:    Medical: Not on file    Non-medical: Not on file  Tobacco Use  . Smoking status: Never Smoker  . Smokeless tobacco: Never Used  Substance and Sexual Activity  . Alcohol use: Yes    Alcohol/week: 0.0 oz    Comment: wine/brandy occasional  . Drug use: No  . Sexual  activity: Never  Lifestyle  . Physical activity:    Days per week: 7 days    Minutes per session: 40 min  . Stress: Only a little  Relationships  . Social connections:    Talks on phone: Patient refused    Gets together: Patient refused    Attends religious service: Patient refused    Active member of club or organization: Patient refused    Attends meetings of clubs or organizations: Patient refused    Relationship status: Patient refused  Other Topics Concern  . Not on file  Social History Narrative   Lives alone no kids    Outpatient Encounter Medications as of 12/27/2017  Medication Sig  . Clobetasol Prop Emollient Base (CLOBETASOL PROPIONATE E) 0.05 % emollient cream Apply 1 application topically 2 (two) times daily. Not face underarms or groin  . dorzolamide-timolol (COSOPT) 22.3-6.8 MG/ML ophthalmic solution   . hydrOXYzine (ATARAX/VISTARIL) 10 MG tablet Take 1 tablet (10 mg total) by mouth 2 (two) times daily as needed.  . Hypochlorous Acid 0.012 % SOLN Apply topically.  Marland Kitchen omeprazole (PRILOSEC) 20 MG capsule TAKE ONE CAPSULE TWICE A DAY BEFORE MEALS  . prednisoLONE acetate (PRED FORTE) 1 % ophthalmic suspension   .  tobramycin (TOBREX) 0.3 % ophthalmic solution   . triamcinolone cream (KENALOG) 0.1 % Apply 1 application topically 2 (two) times daily. Prn not to face, axilla, groin  . [DISCONTINUED] nystatin (MYCOSTATIN) 100000 UNIT/ML suspension Apply to affected area beneath breast bid   No facility-administered encounter medications on file as of 12/27/2017.     Review of Systems  Constitutional: Negative for appetite change and unexpected weight change.  HENT: Negative for congestion and sinus pressure.   Respiratory: Negative for cough, chest tightness and shortness of breath.   Cardiovascular: Negative for chest pain, palpitations and leg swelling.  Gastrointestinal: Negative for abdominal pain, diarrhea and nausea.  Genitourinary: Negative for difficulty urinating  and dysuria.  Musculoskeletal: Negative for joint swelling and myalgias.  Skin: Negative for color change and rash.  Neurological: Negative for dizziness, light-headedness and headaches.  Psychiatric/Behavioral: Negative for agitation and dysphoric mood.       Objective:    Physical Exam  Constitutional: She appears well-developed and well-nourished. No distress.  HENT:  Nose: Nose normal.  Mouth/Throat: Oropharynx is clear and moist.  Eyes: No scleral icterus.  Redness (eye).  Evaluated by optometrist yesterday.   Neck: Neck supple. No thyromegaly present.  Cardiovascular: Normal rate and regular rhythm.  Pulmonary/Chest: Breath sounds normal. No accessory muscle usage. No tachypnea. No respiratory distress. She has no decreased breath sounds. She has no wheezes. She has no rhonchi. Right breast exhibits no inverted nipple, no mass, no nipple discharge and no tenderness (no axillary adenopathy). Left breast exhibits no inverted nipple, no mass, no nipple discharge and no tenderness (no axilarry adenopathy).  Abdominal: Soft. Bowel sounds are normal. There is no tenderness.  Musculoskeletal: She exhibits no edema or tenderness.  Lymphadenopathy:    She has no cervical adenopathy.  Neurological: She is alert.  Skin: No rash noted. No erythema.  Psychiatric: She has a normal mood and affect. Her behavior is normal.    BP 118/62 (BP Location: Left Arm, Patient Position: Sitting, Cuff Size: Normal)   Pulse 76   Temp 97.9 F (36.6 C) (Oral)   Resp 18   Ht 5\' 3"  (1.6 m)   Wt 100 lb 6.4 oz (45.5 kg)   SpO2 97%   BMI 17.79 kg/m  Wt Readings from Last 3 Encounters:  12/27/17 100 lb 6.4 oz (45.5 kg)  12/26/17 99 lb 4 oz (45 kg)  11/08/17 102 lb 12.8 oz (46.6 kg)     Lab Results  Component Value Date   WBC 8.3 11/13/2017   HGB 12.8 11/13/2017   HCT 38.7 11/13/2017   PLT 348.0 11/13/2017   GLUCOSE 97 11/02/2017   CHOL 207 (H) 03/17/2017   TRIG 93 03/17/2017   HDL 72  03/17/2017   LDLCALC 116 (H) 03/17/2017   ALT 15 11/02/2017   AST 19 11/02/2017   NA 139 11/02/2017   K 3.5 11/02/2017   CL 104 11/02/2017   CREATININE 1.00 11/02/2017   BUN 26 (H) 11/02/2017   CO2 27 11/02/2017   TSH 1.47 08/30/2017   INR 0.96 03/16/2017   HGBA1C 5.4 03/17/2017    Ct Abdomen Pelvis W Contrast  Result Date: 11/01/2017 CLINICAL DATA:  Nausea and vomiting.  Diarrhea. EXAM: CT ABDOMEN AND PELVIS WITH CONTRAST TECHNIQUE: Multidetector CT imaging of the abdomen and pelvis was performed using the standard protocol following bolus administration of intravenous contrast. CONTRAST:  62mL OMNIPAQUE IOHEXOL 300 MG/ML  SOLN COMPARISON:  October 14, 2016 FINDINGS: Lower chest: There is bibasilar scarring  and atelectatic change. Hepatobiliary: No focal liver lesions are appreciable. Gallbladder wall is not appreciably thickened. There is no biliary duct dilatation. Pancreas: Pancreas is somewhat atrophic. Pancreatic duct remains mildly prominent in the head and proximal body regions. No pancreatic mass or inflammatory focus evident. Spleen: No splenic lesions are appreciable. Adrenals/Urinary Tract: Adrenals bilaterally appear unremarkable. Kidneys bilaterally show no evident mass or hydronephrosis on either side. There is no appreciable renal or ureteral calculus on either side. Urinary bladder is midline with wall thickness within normal limits. Stomach/Bowel: There are scattered sigmoid diverticula without diverticulitis. There is no appreciable bowel wall or mesenteric thickening. No evident bowel obstruction. No free air or portal venous air. Vascular/Lymphatic: There is atherosclerotic calcification in the aorta and common iliac arteries. Aorta is tortuous without aneurysm. Major mesenteric vessels appear patent, although there are foci of atherosclerotic calcification in proximal major mesenteric arterial vessels. There is no appreciable adenopathy in the abdomen or pelvis. Reproductive:  Uterus is anteverted. Uterus is atrophic consistent with age. No pelvic mass evident. Other: No periappendiceal region inflammation is evident. No ascites or abscess is evident in the abdomen or pelvis. Musculoskeletal: There is degenerative change in scoliosis in the lumbar region. There are no blastic or lytic bone lesions. There is moderate spinal stenosis at L4-5 due to bony hypertrophy and diffuse disc protrusion. No intramuscular or abdominal wall lesions are evident. IMPRESSION: 1. No bowel obstruction. Scattered sigmoid diverticula without diverticulitis evident. No abscess. No periappendiceal region inflammation. 2.  No renal or ureteral calculus.  No hydronephrosis. 3. Moderate spinal stenosis at L4-5 due to bony hypertrophy and diffuse disc protrusion. Multilevel arthropathy in the lumbar spine. 4.  Aortoiliac atherosclerosis.  Aorta tortuous without aneurysm. Aortic Atherosclerosis (ICD10-I70.0). Electronically Signed   By: Lowella Grip III M.D.   On: 11/01/2017 08:14       Assessment & Plan:   Problem List Items Addressed This Visit    BARRETTS ESOPHAGUS    Continue prilosec.  Has declined further GI evaluation.        GERD (gastroesophageal reflux disease)    Controlled on prilosec.       HYPERCHOLESTEROLEMIA    Follow lipid panel.       Mild cognitive impairment    Saw neurology.  Note reviewed.  Discussed assisted living.  Given options.  Discussed at length with her today.  They plan to look into facilities.            Einar Pheasant, MD

## 2017-12-27 NOTE — Telephone Encounter (Signed)
Copied from [place] [id number]. Topic: Quick Communication - Rx Refill/Question >> Dec 27, 2017 10:32 AM Scherrie Gerlach wrote: Medication: nystatin (MYCOSTATIN) 100000 UNIT/ML suspension  Pharmacy calling to ask if you did not mean to send cream or powder for this med. Suspension?? Please resend as appropriate

## 2017-12-27 NOTE — Progress Notes (Signed)
Patient not seen due to need for evaluation by PCP for evaluation of assisted living and completion of required documentation. Patient rescheduled for 12/27/17 with PCP.

## 2017-12-27 NOTE — Telephone Encounter (Deleted)
Copied from [place] [id number]. Topic: Quick Communication - Rx Refill/Question >> Dec 27, 2017 10:32 AM Scherrie Gerlach wrote: Medication: nystatin (MYCOSTATIN) 100000 UNIT/ML suspension  Pharmacy calling to ask if you did not mean to send cream or powder for this med. Suspension?? Please resend as appropriate

## 2017-12-29 ENCOUNTER — Telehealth: Payer: Self-pay

## 2017-12-29 NOTE — Telephone Encounter (Signed)
Copied from [place] [id number]. Topic: Inquiry >> Dec 29, 2017 10:50 AM Scherrie Gerlach wrote: Reason for CRM: cousin Eddie Dibbles calling for the pt to ask which of the life alert systems that would be appropriate for the pt. Eddie Dibbles would like a call back to advise. Please call him back, as he states the pt has been falling and he is concerned. Eddie Dibbles also has had experience with no call back.

## 2017-12-29 NOTE — Telephone Encounter (Signed)
Do you have any idea which of them would be best for her?

## 2017-12-30 ENCOUNTER — Encounter: Payer: Self-pay | Admitting: Internal Medicine

## 2017-12-30 NOTE — Assessment & Plan Note (Signed)
Follow lipid panel.   

## 2017-12-30 NOTE — Assessment & Plan Note (Signed)
Saw neurology.  Note reviewed.  Discussed assisted living.  Given options.  Discussed at length with her today.  They plan to look into facilities.

## 2017-12-30 NOTE — Assessment & Plan Note (Signed)
Continue prilosec.  Has declined further GI evaluation.  

## 2017-12-30 NOTE — Assessment & Plan Note (Signed)
Controlled on prilosec.   

## 2018-01-01 NOTE — Telephone Encounter (Signed)
I think the one called Life Alert is recommended by [facility].

## 2018-01-01 NOTE — Telephone Encounter (Signed)
Called patients cousin Bonnita Nasuti who is on her DPR to let her know about the pendent and if she had any other questions she could let me know.

## 2018-01-09 ENCOUNTER — Ambulatory Visit: Payer: Medicare Other | Admitting: Internal Medicine

## 2018-01-10 DIAGNOSIS — C44729 Squamous cell carcinoma of skin of left lower limb, including hip: Secondary | ICD-10-CM | POA: Diagnosis not present

## 2018-01-12 DIAGNOSIS — H353133 Nonexudative age-related macular degeneration, bilateral, advanced atrophic without subfoveal involvement: Secondary | ICD-10-CM | POA: Diagnosis not present

## 2018-01-12 DIAGNOSIS — H47233 Glaucomatous optic atrophy, bilateral: Secondary | ICD-10-CM | POA: Diagnosis not present

## 2018-01-12 DIAGNOSIS — H35363 Drusen (degenerative) of macula, bilateral: Secondary | ICD-10-CM | POA: Diagnosis not present

## 2018-01-12 DIAGNOSIS — H52223 Regular astigmatism, bilateral: Secondary | ICD-10-CM | POA: Diagnosis not present

## 2018-01-12 DIAGNOSIS — H04123 Dry eye syndrome of bilateral lacrimal glands: Secondary | ICD-10-CM | POA: Diagnosis not present

## 2018-01-12 DIAGNOSIS — G43B0 Ophthalmoplegic migraine, not intractable: Secondary | ICD-10-CM | POA: Diagnosis not present

## 2018-01-12 DIAGNOSIS — H5213 Myopia, bilateral: Secondary | ICD-10-CM | POA: Diagnosis not present

## 2018-01-12 DIAGNOSIS — H401123 Primary open-angle glaucoma, left eye, severe stage: Secondary | ICD-10-CM | POA: Diagnosis not present

## 2018-01-12 DIAGNOSIS — H1820 Unspecified corneal edema: Secondary | ICD-10-CM | POA: Diagnosis not present

## 2018-01-12 DIAGNOSIS — H401113 Primary open-angle glaucoma, right eye, severe stage: Secondary | ICD-10-CM | POA: Diagnosis not present

## 2018-01-12 DIAGNOSIS — H18832 Recurrent erosion of cornea, left eye: Secondary | ICD-10-CM | POA: Diagnosis not present

## 2018-01-12 DIAGNOSIS — S0502XA Injury of conjunctiva and corneal abrasion without foreign body, left eye, initial encounter: Secondary | ICD-10-CM | POA: Diagnosis not present

## 2018-01-14 ENCOUNTER — Other Ambulatory Visit: Payer: Self-pay

## 2018-01-14 ENCOUNTER — Emergency Department
Admission: EM | Admit: 2018-01-14 | Discharge: 2018-01-14 | Disposition: A | Discharge status: Home / Self Care | Payer: Medicare Other | Attending: Emergency Medicine | Admitting: Emergency Medicine

## 2018-01-14 DIAGNOSIS — M79605 Pain in left leg: Secondary | ICD-10-CM | POA: Diagnosis not present

## 2018-01-14 DIAGNOSIS — Z5189 Encounter for other specified aftercare: Secondary | ICD-10-CM | POA: Insufficient documentation

## 2018-01-14 DIAGNOSIS — L988 Other specified disorders of the skin and subcutaneous tissue: Secondary | ICD-10-CM | POA: Insufficient documentation

## 2018-01-14 DIAGNOSIS — Z8673 Personal history of transient ischemic attack (TIA), and cerebral infarction without residual deficits: Secondary | ICD-10-CM | POA: Insufficient documentation

## 2018-01-14 DIAGNOSIS — Z79899 Other long term (current) drug therapy: Secondary | ICD-10-CM | POA: Insufficient documentation

## 2018-01-14 DIAGNOSIS — Z4800 Encounter for change or removal of nonsurgical wound dressing: Secondary | ICD-10-CM | POA: Diagnosis not present

## 2018-01-14 DIAGNOSIS — M79662 Pain in left lower leg: Secondary | ICD-10-CM | POA: Diagnosis not present

## 2018-01-14 NOTE — Discharge Instructions (Signed)
Please follow up with your doctor as soon as possible regarding today's emergent visit.   Return to the ED or call your doctor if you notice any signs of infection such as fever, increased pain, a cold or blue foot, redness, pus, or other symptoms that concern you.

## 2018-01-14 NOTE — ED Notes (Signed)
Dressing taken off left lower leg/pt noted with closed incision/ approximated edges noted with sutures/ no drainage or odor noted/ blankets provided to patient

## 2018-01-14 NOTE — ED Provider Notes (Signed)
[facility] Emergency Department Provider Note   ____________________________________________   First MD Initiated Contact with Patient 01/14/18 [phone]     (approximate)  I have reviewed the triage vital signs and the nursing notes.   HISTORY  Chief Complaint Leg Pain (left lower leg pain/ lesion removed and bandage in place from pcp/ no swelling noted)    HPI Christy Cisneros is a 84 y.o. female reports that she has been experiencing pain in her left lower leg.  She reports that she had a skin biopsied for a cancerous lesion.  Thinks that was about 2 weeks ago.  She had a bandage left dominant and reports since yesterday has begun hurting, she reports that someone changed it but she is not sure is who.  Is been sore.  No fevers or chills.  Denies numbness or tingling or cold blue left foot.  She reports after paramedics unwrapped that the pain got better and she feels better now.  She has not seen any drainage or redness around the area.  No fevers.  She reports that she feels fine right now, and has no ongoing complaint.  The pain was a throbbing somewhat tight feeling around the bandage.  Past Medical History:  Diagnosis Date  . Barrett's esophagus with esophagitis   . Chronic bronchitis ([place])   . GERD (gastroesophageal reflux disease)   . Glaucoma   . Hypercholesteremia   . Osteoarthritis   . Osteoporosis   . Sleep apnea    has CPAP  . TIA (transient ischemic attack)     Patient Active Problem List   Diagnosis Date Noted  . History of CVA (cerebrovascular accident) 03/16/2017  . Memory change 09/04/2016  . Pneumonia 07/17/2016  . Mild cognitive impairment 05/15/2016  . Dizziness 04/21/2016  . Neck pain 02/11/2016  . Muscle cramps 11/14/2015  . Urine incontinence 08/16/2015  . Hematoma 08/16/2015  . Weakness 07/12/2015  . Headache 06/16/2015  . Unsteady gait 06/01/2015  . GERD (gastroesophageal reflux disease) 08/11/2014  . UTI  (urinary tract infection) 08/07/2014  . DYSPNEA 04/17/2009  . HYPERCHOLESTEROLEMIA 04/16/2009  . Obstructive sleep apnea 04/16/2009  . Glaucoma 04/16/2009  . Transient cerebral ischemia 04/16/2009  . BARRETTS ESOPHAGUS 04/16/2009  . OSTEOARTHRITIS 04/16/2009  . Osteoporosis 04/16/2009    Past Surgical History:  Procedure Laterality Date  . BUNIONECTOMY     left  . ROTATOR CUFF REPAIR     right    Prior to Admission medications   Medication Sig Start Date End Date Taking? Authorizing Provider  Clobetasol Prop Emollient Base (CLOBETASOL PROPIONATE E) 0.05 % emollient cream Apply 1 application topically 2 (two) times daily. Not face underarms or groin 11/08/17   McLean-Scocuzza, Nino Glow, MD  dorzolamide-timolol (COSOPT) 22.3-6.8 MG/ML ophthalmic solution  12/08/17   [provider]  hydrOXYzine (ATARAX/VISTARIL) 10 MG tablet Take 1 tablet (10 mg total) by mouth 2 (two) times daily as needed. 11/08/17   McLean-Scocuzza, Nino Glow, MD  Hypochlorous Acid 0.012 % SOLN Apply topically.    [provider]  nystatin cream (MYCOSTATIN) Apply 1 application topically 2 (two) times daily. 12/27/17   Einar Pheasant, MD  omeprazole (PRILOSEC) 20 MG capsule TAKE ONE CAPSULE TWICE A DAY BEFORE MEALS 11/17/16   Einar Pheasant, MD  prednisoLONE acetate (PRED FORTE) 1 % ophthalmic suspension  12/08/17   [provider]  tobramycin (TOBREX) 0.3 % ophthalmic solution  11/02/17   [provider]  triamcinolone cream (KENALOG) 0.1 % Apply  1 application topically 2 (two) times daily. Prn not to face, axilla, groin 11/08/17   McLean-Scocuzza, Nino Glow, MD    Allergies Alphagan [brimonidine]; Avelox [moxifloxacin hcl in nacl]; Bacitracin; Cefdinir; Cephalexin; Clarithromycin; Codeine; Erythromycin; Hydrocodone-acetaminophen; Lansoprazole; Polymyxin b; and Vigamox [moxifloxacin]  Family History  Problem Relation Age of Onset  . Breast cancer Sister   . Heart disease Mother      Social History Social History   Tobacco Use  . Smoking status: Never Smoker  . Smokeless tobacco: Never Used  Substance Use Topics  . Alcohol use: Yes    Alcohol/week: 0.0 oz    Comment: wine/brandy occasional  . Drug use: No    Review of Systems Constitutional: No fever/chills Eyes: No visual changes. ENT: No sore throat. Cardiovascular: Denies chest pain. Respiratory: Denies shortness of breath. Gastrointestinal: No abdominal pain.   Genitourinary: Negative for dysuria. Musculoskeletal: Negative for back pain. Skin: Negative for rash.  See HPI. Neurological: Negative for headaches or numbness.    ____________________________________________   PHYSICAL EXAM:  VITAL SIGNS: ED Triage Vitals  Enc Vitals Group     BP 01/14/18 0723 (!) 151/91     Pulse Rate 01/14/18 0723 73     Resp 01/14/18 0723 20     Temp 01/14/18 0723 98.1 F (36.7 C)     Temp Source 01/14/18 0723 Oral     SpO2 01/14/18 0723 99 %     Weight 01/14/18 0724 120 lb (54.4 kg)     Height 01/14/18 0724 5\' 2"  (1.575 m)     Head Circumference --      Peak Flow --      Pain Score 01/14/18 0723 3     Pain Loc --      Pain Edu? --      Excl. in [place]? --     Constitutional: Alert and oriented though seems just slightly cognitively slow likely consistent with her age, but very well oriented. Well appearing and in no acute distress. Eyes: Conjunctivae are normal. Head: Atraumatic. Nose: No congestion/rhinnorhea. Mouth/Throat: Mucous membranes are moist. Neck: No stridor.   Cardiovascular: Normal rate, regular rhythm. Grossly normal heart sounds.  Good peripheral circulation. Respiratory: Normal respiratory effort.  No retractions. Lungs CTAB. Gastrointestinal: Soft and nontender. No distention. Musculoskeletal:   Lower Extremities  No edema. Normal DP/PT pulses bilateral with good cap refill.  Normal neuro-motor function lower extremities bilateral.  RIGHT Right lower extremity demonstrates  normal strength, good use of all muscles. No edema bruising or contusions of the right hip, right knee, right ankle. Full range of motion of the right lower extremity without pain. No pain on axial loading. No evidence of trauma.  LEFT Left lower extremity demonstrates normal strength, good use of all muscles. No edema bruising or contusions of the hip,  knee, ankle. Full range of motion of the left lower extremity without pain. No pain on axial loading. No evidence of trauma except for what appears to be a very well healing sutured incision that is approximately 5 cm long over the anterior left lower leg.  There is no surrounding edema erythema or warmth.  No drainage.  Patient reports this is the area that was causing pain, but she thinks the bandage that had been applied might of been too tight or pulling on her skin and all her symptoms are better now.   Neurologic:  Normal speech and language. No gross focal neurologic deficits are appreciated.  Skin:  Skin is warm, dry and  intact. No rash noted. Psychiatric: Mood and affect are normal. Speech and behavior are normal.  ____________________________________________   LABS (all labs ordered are listed, but only abnormal results are displayed)  Labs Reviewed - No data to display ____________________________________________  EKG   ____________________________________________  RADIOLOGY   ____________________________________________   PROCEDURES  Procedure(s) performed: None  Procedures  Critical Care performed: No  ____________________________________________   INITIAL IMPRESSION / ASSESSMENT AND PLAN / ED COURSE  Pertinent labs & imaging results that were available during my care of the patient were reviewed by me and considered in my medical decision making (see chart for details).  Patient resents for evaluation of left leg pain.  Very reassuring exam with strong peripheral pulses and normal distal neurologic  examination.  Good muscular examination.  It appears that her pain was likely related from a bandage that might have been too tight over the wound, this is been removed and she reports all symptoms are gone.  There is no evidence of DVT, no signs of superinfection, no evidence of any vascular or neurologic compromise.  There appears to be well-healing and now that the bandage has been removed she reports she is asymptomatic.  ----------------------------------------- 8:08 AM on 01/14/2018 -----------------------------------------  Discussed wound care precautions, need for follow-up with her primary care doctor this week, patient is agreeable, and will discharge her back to home.  Patient currently working with nurse to identify best means of transport for her to return home      ____________________________________________   FINAL CLINICAL IMPRESSION(S) / ED DIAGNOSES  Final diagnoses:  Visit for wound check  Leg pain, anterior, left      NEW MEDICATIONS STARTED DURING THIS VISIT:  New Prescriptions   No medications on file     Note:  This document was prepared using Dragon voice recognition software and may include unintentional dictation errors.     Delman Kitten, MD 01/14/18 [phone]

## 2018-01-15 ENCOUNTER — Telehealth: Payer: Self-pay | Admitting: Internal Medicine

## 2018-01-15 NOTE — Telephone Encounter (Signed)
Please advise 

## 2018-01-15 NOTE — Telephone Encounter (Signed)
Copied from [place] [id number]. Topic: Appointment Scheduling - Scheduling Inquiry for Clinic >> Jan 15, 2018  2:15 PM Robina Ade, Helene Kelp D wrote: Reason for CRM: Patient called and said that she needs a ED f/u with Dr. Nicki Reaper. She does not have anything available for this. Please call patient back to see when she can be worked in for the ED f/u visit.

## 2018-01-15 NOTE — Telephone Encounter (Signed)
Pt dropped off forms for Independent Living for [facility]. Please call Pleas Koch at [phone] to pick up forms. Papers are up front in color folder

## 2018-01-16 NOTE — Telephone Encounter (Signed)
Spoke with Pleas Koch again. She advisded that forms are not needed at this time. There is not a wait list at [facility] and pt has not officially decided to move there and started the process. The forms should not be completed until she is actually moving in.

## 2018-01-16 NOTE — Telephone Encounter (Signed)
Called patient and Christy Cisneros to let them know that Dr. Nicki Reaper is out of the office this week and will return on Monday. We will complete forms then.

## 2018-01-22 DIAGNOSIS — H401113 Primary open-angle glaucoma, right eye, severe stage: Secondary | ICD-10-CM | POA: Diagnosis not present

## 2018-01-22 DIAGNOSIS — H04123 Dry eye syndrome of bilateral lacrimal glands: Secondary | ICD-10-CM | POA: Diagnosis not present

## 2018-01-22 DIAGNOSIS — H47233 Glaucomatous optic atrophy, bilateral: Secondary | ICD-10-CM | POA: Diagnosis not present

## 2018-01-22 DIAGNOSIS — H5213 Myopia, bilateral: Secondary | ICD-10-CM | POA: Diagnosis not present

## 2018-01-22 DIAGNOSIS — G43B0 Ophthalmoplegic migraine, not intractable: Secondary | ICD-10-CM | POA: Diagnosis not present

## 2018-01-22 DIAGNOSIS — H353133 Nonexudative age-related macular degeneration, bilateral, advanced atrophic without subfoveal involvement: Secondary | ICD-10-CM | POA: Diagnosis not present

## 2018-01-22 DIAGNOSIS — H35363 Drusen (degenerative) of macula, bilateral: Secondary | ICD-10-CM | POA: Diagnosis not present

## 2018-01-22 DIAGNOSIS — H1820 Unspecified corneal edema: Secondary | ICD-10-CM | POA: Diagnosis not present

## 2018-01-22 DIAGNOSIS — S0502XA Injury of conjunctiva and corneal abrasion without foreign body, left eye, initial encounter: Secondary | ICD-10-CM | POA: Diagnosis not present

## 2018-01-22 DIAGNOSIS — H52223 Regular astigmatism, bilateral: Secondary | ICD-10-CM | POA: Diagnosis not present

## 2018-01-22 DIAGNOSIS — H401123 Primary open-angle glaucoma, left eye, severe stage: Secondary | ICD-10-CM | POA: Diagnosis not present

## 2018-01-22 DIAGNOSIS — H18832 Recurrent erosion of cornea, left eye: Secondary | ICD-10-CM | POA: Diagnosis not present

## 2018-01-25 ENCOUNTER — Encounter: Payer: Self-pay | Admitting: Family Medicine

## 2018-01-25 ENCOUNTER — Ambulatory Visit (INDEPENDENT_AMBULATORY_CARE_PROVIDER_SITE_OTHER): Payer: Medicare Other | Admitting: Family Medicine

## 2018-01-25 VITALS — BP 132/86 | HR 90 | Temp 98.2°F

## 2018-01-25 DIAGNOSIS — Z4802 Encounter for removal of sutures: Secondary | ICD-10-CM | POA: Diagnosis not present

## 2018-01-25 NOTE — Patient Instructions (Signed)
Next appointment with Dr. Nicki Reaper is 02/26/18.  Please follow up with dermatology as scheduled.  It was a pleasure to see you today!  Suture Removal, Care After Refer to this sheet in the next few weeks. These instructions provide you with information on caring for yourself after your procedure. Your health care provider may also give you more specific instructions. Your treatment has been planned according to current medical practices, but problems sometimes occur. Call your health care provider if you have any problems or questions after your procedure. What can I expect after the procedure? After your stitches (sutures) are removed, it is typical to have the following:  Some discomfort and swelling in the wound area.  Slight redness in the area.  Follow these instructions at home:  If you have skin adhesive strips over the wound area, do not take the strips off. They will fall off on their own in a few days. If the strips remain in place after 14 days, you may remove them.  Change any bandages (dressings) at least once a day or as directed by your health care provider. If the bandage sticks, soak it off with warm, soapy water.  Apply cream or ointment only as directed by your health care provider. If using cream or ointment, wash the area with soap and water 2 times a day to remove all the cream or ointment. Rinse off the soap and pat the area dry with a clean towel.  Keep the wound area dry and clean. If the bandage becomes wet or dirty, or if it develops a bad smell, change it as soon as possible.  Continue to protect the wound from injury.  Use sunscreen when out in the sun. New scars become sunburned easily. Contact a health care provider if:  You have increasing redness, swelling, or pain in the wound.  You see pus coming from the wound.  You have a fever.  You notice a bad smell coming from the wound or dressing.  Your wound breaks open (edges not staying together). This  information is not intended to replace advice given to you by your health care provider. Make sure you discuss any questions you have with your health care provider. Document Released: 04/12/2001 Document Revised: 12/24/2015 Document Reviewed: 02/27/2013 Elsevier Interactive Patient Education  2017 [employer].

## 2018-01-25 NOTE — Progress Notes (Signed)
Subjective:    Patient ID: Christy Cisneros, female    DOB: Dec 16, 1919, 84 y.o.   MRN: [id number]  HPI  Christy Cisneros is a 84 year old female who presents today for suture removal. She reports that sutures were placed by a dermatology provider related to a skin biopsy for cancerous lesion. She is unsure of the timing however believes that placement occurred approximately 3 weeks ago. She believes her follow up appointment with dermatology is next week but is unsure.  Today, she reports feeling well and denies pain, fever, chills, drainage, warmth, numbness, tingling, or red streaks from area.  On 01/14/18, she was evaluated in the ED for a wound check as she was experiencing pain. Evaluation revealed that source of pain was likely due to bandage being too tight and symptoms improved after removing dressing. Exam WNL and reassuring otherwise.   History of progressive memory issues  Review of Systems  Constitutional: Negative for chills, fatigue and fever.  Respiratory: Negative for cough and shortness of breath.   Cardiovascular: Negative for chest pain, palpitations and leg swelling.  Gastrointestinal: Negative for abdominal pain.  Skin: Negative for rash.       Suture removal of site on left leg  Psychiatric/Behavioral:       Denies depressed or anxious mood   Past Medical History:  Diagnosis Date  . Barrett's esophagus with esophagitis   . Chronic bronchitis ([place])   . GERD (gastroesophageal reflux disease)   . Glaucoma   . Hypercholesteremia   . Osteoarthritis   . Osteoporosis   . Sleep apnea    has CPAP  . TIA (transient ischemic attack)      Social History   Socioeconomic History  . Marital status: Single    Spouse name: Not on file  . Number of children: Not on file  . Years of education: Not on file  . Highest education level: Not on file  Occupational History  . Not on file  Social Needs  . Financial resource strain: Not on file  . Food insecurity:   Worry: Not on file    Inability: Not on file  . Transportation needs:    Medical: Not on file    Non-medical: Not on file  Tobacco Use  . Smoking status: Never Smoker  . Smokeless tobacco: Never Used  Substance and Sexual Activity  . Alcohol use: Yes    Alcohol/week: 0.0 oz    Comment: wine/brandy occasional  . Drug use: No  . Sexual activity: Never  Lifestyle  . Physical activity:    Days per week: 7 days    Minutes per session: 40 min  . Stress: Only a little  Relationships  . Social connections:    Talks on phone: Patient refused    Gets together: Patient refused    Attends religious service: Patient refused    Active member of club or organization: Patient refused    Attends meetings of clubs or organizations: Patient refused    Relationship status: Patient refused  . Intimate partner violence:    Fear of current or ex partner: Patient refused    Emotionally abused: Patient refused    Physically abused: Patient refused    Forced sexual activity: Patient refused  Other Topics Concern  . Not on file  Social History Narrative   Lives alone no kids    Past Surgical History:  Procedure Laterality Date  . BUNIONECTOMY     left  . ROTATOR CUFF REPAIR  right    Family History  Problem Relation Age of Onset  . Breast cancer Sister   . Heart disease Mother     Allergies  Allergen Reactions  . Alphagan [Brimonidine]     Burning, stinging, bloody eye, dry mouth  . Avelox [Moxifloxacin Hcl In Nacl] Other (See Comments)    Dizziness, weakness  . Bacitracin     Tearing, itching, swelling, redness  . Cefdinir Nausea And Vomiting  . Cephalexin     HA, nausea, gas, abdominal pain  . Clarithromycin     HA, nausea, gas, abdominal pain  . Codeine     HA, nausea, gas, abdominal pain  . Erythromycin     HA, nausea, gas, abdominal pain  . Hydrocodone-Acetaminophen     REACTION: chest pain, constipation, nausea  . Lansoprazole     REACTION: cramps, nausea,  diarrhea  . Polymyxin B     Itching and redness  . Vigamox [Moxifloxacin] Itching    Soreness, irritability, loss of appetite,  Dizziness, diarrhea, muscle weakness    Current Outpatient Medications on File Prior to Visit  Medication Sig Dispense Refill  . Clobetasol Prop Emollient Base (CLOBETASOL PROPIONATE E) 0.05 % emollient cream Apply 1 application topically 2 (two) times daily. Not face underarms or groin 45 g 0  . dorzolamide-timolol (COSOPT) 22.3-6.8 MG/ML ophthalmic solution     . hydrOXYzine (ATARAX/VISTARIL) 10 MG tablet Take 1 tablet (10 mg total) by mouth 2 (two) times daily as needed. 60 tablet 1  . Hypochlorous Acid 0.012 % SOLN Apply topically.    . nystatin cream (MYCOSTATIN) Apply 1 application topically 2 (two) times daily. 30 g 0  . omeprazole (PRILOSEC) 20 MG capsule TAKE ONE CAPSULE TWICE A DAY BEFORE MEALS 120 capsule 1  . prednisoLONE acetate (PRED FORTE) 1 % ophthalmic suspension     . tobramycin (TOBREX) 0.3 % ophthalmic solution     . triamcinolone cream (KENALOG) 0.1 % Apply 1 application topically 2 (two) times daily. Prn not to face, axilla, groin 454 g 1   No current facility-administered medications on file prior to visit.     BP 132/86 (BP Location: Left Arm, Patient Position: Sitting, Cuff Size: Normal)   Pulse 90   Temp 98.2 F (36.8 C) (Oral)   SpO2 96%       Objective:   Physical Exam  Constitutional:  Thin, optimally nourished  Eyes: Pupils are equal, round, and reactive to light. No scleral icterus.  Neck: Neck supple.  Cardiovascular: Normal rate, normal heart sounds and intact distal pulses.  Pulmonary/Chest: Effort normal and breath sounds normal.  Lymphadenopathy:    She has no cervical adenopathy.  Neurological: She is alert.  Skin: Skin is warm and dry. Capillary refill takes less than 2 seconds. No erythema.  Well healing sutures present on anterior left lower leg. No evidence of erythema, edema, drainage, warmth, or red streaks  present  Psychiatric: She has a normal mood and affect. Her behavior is normal.       Assessment & Plan:  1. Encounter for removal of sutures Seven sutures removed on left lower leg without difficulty. No evidence of erythema, edema, warmth, drainage, or red streaks present. Area cleaned with sterile saline, applied antibiotic ointment, covered with nonstick dressing, and secured with coban wrap. Advised follow up with dermatology provider as scheduled next week. Provided next PCP follow up date to patient as a reminder. Return precautions advised.   Delano Metz, FNP-C

## 2018-02-05 DIAGNOSIS — Z961 Presence of intraocular lens: Secondary | ICD-10-CM | POA: Diagnosis not present

## 2018-02-05 DIAGNOSIS — H18232 Secondary corneal edema, left eye: Secondary | ICD-10-CM | POA: Diagnosis not present

## 2018-02-05 DIAGNOSIS — H401433 Capsular glaucoma with pseudoexfoliation of lens, bilateral, severe stage: Secondary | ICD-10-CM | POA: Diagnosis not present

## 2018-02-05 DIAGNOSIS — H10232 Serous conjunctivitis, except viral, left eye: Secondary | ICD-10-CM | POA: Diagnosis not present

## 2018-02-05 DIAGNOSIS — H02054 Trichiasis without entropian left upper eyelid: Secondary | ICD-10-CM | POA: Diagnosis not present

## 2018-02-13 ENCOUNTER — Telehealth: Payer: Self-pay | Admitting: Internal Medicine

## 2018-02-13 DIAGNOSIS — H18232 Secondary corneal edema, left eye: Secondary | ICD-10-CM | POA: Diagnosis not present

## 2018-02-13 DIAGNOSIS — H401433 Capsular glaucoma with pseudoexfoliation of lens, bilateral, severe stage: Secondary | ICD-10-CM | POA: Diagnosis not present

## 2018-02-13 NOTE — Telephone Encounter (Signed)
Pt and her Nephew came in and dropped off asst living form to be filled out.. Placed in Dr.Scotts colored folder upfront.. They are aware that Dr.Scott is out of the office. Nephew would like form mailed back to him envelope is attached  Christy Cisneros please see me before you get this

## 2018-02-13 NOTE — Telephone Encounter (Signed)
Paper work picked up. Holding for PCP return

## 2018-02-20 NOTE — Telephone Encounter (Signed)
I held this paper work for your return. Caryl Pina explained that you were gone all week last week and patient has an appt coming up on 7/29. See me about this.

## 2018-02-20 NOTE — Telephone Encounter (Signed)
Will complete at her appt.  Paul aware.

## 2018-02-26 ENCOUNTER — Ambulatory Visit (INDEPENDENT_AMBULATORY_CARE_PROVIDER_SITE_OTHER): Payer: Medicare Other | Admitting: Internal Medicine

## 2018-02-26 ENCOUNTER — Encounter: Payer: Self-pay | Admitting: Internal Medicine

## 2018-02-26 DIAGNOSIS — T148XXA Other injury of unspecified body region, initial encounter: Secondary | ICD-10-CM

## 2018-02-26 DIAGNOSIS — K2100 Gastro-esophageal reflux disease with esophagitis, without bleeding: Secondary | ICD-10-CM

## 2018-02-26 DIAGNOSIS — E7800 Pure hypercholesterolemia, unspecified: Secondary | ICD-10-CM

## 2018-02-26 DIAGNOSIS — R413 Other amnesia: Secondary | ICD-10-CM

## 2018-02-26 DIAGNOSIS — K2270 Barrett's esophagus without dysplasia: Secondary | ICD-10-CM

## 2018-02-26 NOTE — Progress Notes (Signed)
Patient ID: Christy Cisneros, female   DOB: [birth year], 84 y.o.   MRN: [id number]   Subjective:    Patient ID: Christy Cisneros, female    DOB: Oct 07, 1919, 84 y.o.   MRN: [id number]  HPI  Patient here for a scheduled follow up.  She feels she is doing relatively well.  Discussed my concern regarding her living by herself.  We discussed assisted living.  Discussed her moving up [place] and staying close to her cousin.  States she is not interested in doing this and is not interested in assisted living.  Has laceration on her arm with dried bandage.  Has life line now, but I explained for her safety, I still felt it would be best for her to be in an assisted living facility.  No chest pain.  Still walking.  No sob.  No acid reflux reported.  Eating.  No abdominal pain.  Bowels moving.     Past Medical History:  Diagnosis Date  . Barrett's esophagus with esophagitis   . Chronic bronchitis ([place])   . GERD (gastroesophageal reflux disease)   . Glaucoma   . Hypercholesteremia   . Osteoarthritis   . Osteoporosis   . Sleep apnea    has CPAP  . TIA (transient ischemic attack)    Past Surgical History:  Procedure Laterality Date  . BUNIONECTOMY     left  . ROTATOR CUFF REPAIR     right   Family History  Problem Relation Age of Onset  . Breast cancer Sister   . Heart disease Mother    Social History   Socioeconomic History  . Marital status: Single    Spouse name: Not on file  . Number of children: Not on file  . Years of education: Not on file  . Highest education level: Not on file  Occupational History  . Not on file  Social Needs  . Financial resource strain: Not on file  . Food insecurity:    Worry: Not on file    Inability: Not on file  . Transportation needs:    Medical: Not on file    Non-medical: Not on file  Tobacco Use  . Smoking status: Never Smoker  . Smokeless tobacco: Never Used  Substance and Sexual Activity  . Alcohol use: Yes    Alcohol/week: 0.0 oz      Comment: wine/brandy occasional  . Drug use: No  . Sexual activity: Never  Lifestyle  . Physical activity:    Days per week: 7 days    Minutes per session: 40 min  . Stress: Only a little  Relationships  . Social connections:    Talks on phone: Patient refused    Gets together: Patient refused    Attends religious service: Patient refused    Active member of club or organization: Patient refused    Attends meetings of clubs or organizations: Patient refused    Relationship status: Patient refused  Other Topics Concern  . Not on file  Social History Narrative   Lives alone no kids    Outpatient Encounter Medications as of 02/26/2018  Medication Sig  . Clobetasol Prop Emollient Base (CLOBETASOL PROPIONATE E) 0.05 % emollient cream Apply 1 application topically 2 (two) times daily. Not face underarms or groin  . dorzolamide-timolol (COSOPT) 22.3-6.8 MG/ML ophthalmic solution   . hydrOXYzine (ATARAX/VISTARIL) 10 MG tablet Take 1 tablet (10 mg total) by mouth 2 (two) times daily as needed.  . Hypochlorous Acid 0.012 % SOLN Apply  topically.  . nystatin cream (MYCOSTATIN) Apply 1 application topically 2 (two) times daily.  Marland Kitchen omeprazole (PRILOSEC) 20 MG capsule TAKE ONE CAPSULE TWICE A DAY BEFORE MEALS  . prednisoLONE acetate (PRED FORTE) 1 % ophthalmic suspension   . tobramycin (TOBREX) 0.3 % ophthalmic solution   . triamcinolone cream (KENALOG) 0.1 % Apply 1 application topically 2 (two) times daily. Prn not to face, axilla, groin   No facility-administered encounter medications on file as of 02/26/2018.     Review of Systems  Constitutional: Negative for appetite change and unexpected weight change.  HENT: Negative for congestion and sinus pressure.   Respiratory: Negative for cough, chest tightness and shortness of breath.   Cardiovascular: Negative for chest pain, palpitations and leg swelling.  Gastrointestinal: Negative for abdominal pain, diarrhea, nausea and vomiting.   Genitourinary: Negative for difficulty urinating and dysuria.  Musculoskeletal: Negative for joint swelling and myalgias.  Skin: Negative for color change and rash.       Abrasion/laceration - left arm.    Neurological: Negative for dizziness, light-headedness and headaches.  Psychiatric/Behavioral: Negative for agitation and dysphoric mood.       Objective:    Physical Exam  Constitutional: She appears well-developed and well-nourished. No distress.  HENT:  Nose: Nose normal.  Mouth/Throat: Oropharynx is clear and moist.  Neck: Neck supple. No thyromegaly present.  Cardiovascular: Normal rate and regular rhythm.  Pulmonary/Chest: Breath sounds normal. No respiratory distress. She has no wheezes.  Abdominal: Soft. Bowel sounds are normal. There is no tenderness.  Musculoskeletal: She exhibits no edema or tenderness.  Lymphadenopathy:    She has no cervical adenopathy.  Skin: No rash noted.  Open - superficial wound - no surrounding erythema.    Psychiatric: She has a normal mood and affect. Her behavior is normal.    BP 122/70 (BP Location: Left Arm, Patient Position: Sitting, Cuff Size: Normal)   Pulse 81   Temp 97.8 F (36.6 C) (Oral)   Resp 18   Wt 98 lb 9.6 oz (44.7 kg)   SpO2 97%   BMI 18.03 kg/m  Wt Readings from Last 3 Encounters:  02/26/18 98 lb 9.6 oz (44.7 kg)  01/14/18 120 lb (54.4 kg)  12/27/17 100 lb 6.4 oz (45.5 kg)     Lab Results  Component Value Date   WBC 8.3 11/13/2017   HGB 12.8 11/13/2017   HCT 38.7 11/13/2017   PLT 348.0 11/13/2017   GLUCOSE 97 11/02/2017   CHOL 207 (H) 03/17/2017   TRIG 93 03/17/2017   HDL 72 03/17/2017   LDLCALC 116 (H) 03/17/2017   ALT 15 11/02/2017   AST 19 11/02/2017   NA 139 11/02/2017   K 3.5 11/02/2017   CL 104 11/02/2017   CREATININE 1.00 11/02/2017   BUN 26 (H) 11/02/2017   CO2 27 11/02/2017   TSH 1.47 08/30/2017   INR 0.96 03/16/2017   HGBA1C 5.4 03/17/2017       Assessment & Plan:   Problem  List Items Addressed This Visit    Abrasion    Wound/abrasion - left arm.  Old dry bandage removed.  Cleaned and dressing applied.  Will have home health follow and dressing change, etc.       Relevant Orders   Ambulatory referral to [facility].  Has declined further GI evaluation.       GERD (gastroesophageal reflux disease)    On prilosec.  HYPERCHOLESTEROLEMIA    Follow lipid panel.       Memory change    Saw neurology.  Concern regarding some progressive memory issues.  Concerned regarding her safety at home and compliance with medication.  Discussed assisted living.  Has life line.  She declines assisted living.  Will arrange home health for wound/dressing changes.          Disposition:  Discussed placement in assisted living.  Discussed concern over safety, etc.  She declines assisted living.     Einar Pheasant, MD

## 2018-02-28 ENCOUNTER — Encounter: Payer: Self-pay | Admitting: Internal Medicine

## 2018-02-28 DIAGNOSIS — T148XXA Other injury of unspecified body region, initial encounter: Secondary | ICD-10-CM | POA: Insufficient documentation

## 2018-02-28 NOTE — Assessment & Plan Note (Signed)
On prilosec

## 2018-02-28 NOTE — Assessment & Plan Note (Signed)
Saw neurology.  Concern regarding some progressive memory issues.  Concerned regarding her safety at home and compliance with medication.  Discussed assisted living.  Has life line.  She declines assisted living.  Will arrange home health for wound/dressing changes.

## 2018-02-28 NOTE — Assessment & Plan Note (Signed)
Wound/abrasion - left arm.  Old dry bandage removed.  Cleaned and dressing applied.  Will have home health follow and dressing change, etc.

## 2018-02-28 NOTE — Assessment & Plan Note (Signed)
Continue prilosec.  Has declined further GI evaluation.

## 2018-02-28 NOTE — Assessment & Plan Note (Signed)
Follow lipid panel.   

## 2018-03-02 DIAGNOSIS — K2270 Barrett's esophagus without dysplasia: Secondary | ICD-10-CM | POA: Diagnosis not present

## 2018-03-02 DIAGNOSIS — J42 Unspecified chronic bronchitis: Secondary | ICD-10-CM | POA: Diagnosis not present

## 2018-03-02 DIAGNOSIS — M6281 Muscle weakness (generalized): Secondary | ICD-10-CM | POA: Diagnosis not present

## 2018-03-02 DIAGNOSIS — S51812D Laceration without foreign body of left forearm, subsequent encounter: Secondary | ICD-10-CM | POA: Diagnosis not present

## 2018-03-02 DIAGNOSIS — H18232 Secondary corneal edema, left eye: Secondary | ICD-10-CM | POA: Diagnosis not present

## 2018-03-02 DIAGNOSIS — H409 Unspecified glaucoma: Secondary | ICD-10-CM | POA: Diagnosis not present

## 2018-03-02 DIAGNOSIS — H401433 Capsular glaucoma with pseudoexfoliation of lens, bilateral, severe stage: Secondary | ICD-10-CM | POA: Diagnosis not present

## 2018-03-02 DIAGNOSIS — H547 Unspecified visual loss: Secondary | ICD-10-CM | POA: Diagnosis not present

## 2018-03-07 DIAGNOSIS — H409 Unspecified glaucoma: Secondary | ICD-10-CM | POA: Diagnosis not present

## 2018-03-07 DIAGNOSIS — J42 Unspecified chronic bronchitis: Secondary | ICD-10-CM | POA: Diagnosis not present

## 2018-03-07 DIAGNOSIS — S51812D Laceration without foreign body of left forearm, subsequent encounter: Secondary | ICD-10-CM | POA: Diagnosis not present

## 2018-03-07 DIAGNOSIS — M6281 Muscle weakness (generalized): Secondary | ICD-10-CM | POA: Diagnosis not present

## 2018-03-07 DIAGNOSIS — K2270 Barrett's esophagus without dysplasia: Secondary | ICD-10-CM | POA: Diagnosis not present

## 2018-03-07 DIAGNOSIS — H547 Unspecified visual loss: Secondary | ICD-10-CM | POA: Diagnosis not present

## 2018-03-08 DIAGNOSIS — M6281 Muscle weakness (generalized): Secondary | ICD-10-CM | POA: Diagnosis not present

## 2018-03-08 DIAGNOSIS — H547 Unspecified visual loss: Secondary | ICD-10-CM | POA: Diagnosis not present

## 2018-03-08 DIAGNOSIS — S51812D Laceration without foreign body of left forearm, subsequent encounter: Secondary | ICD-10-CM | POA: Diagnosis not present

## 2018-03-08 DIAGNOSIS — H409 Unspecified glaucoma: Secondary | ICD-10-CM | POA: Diagnosis not present

## 2018-03-08 DIAGNOSIS — J42 Unspecified chronic bronchitis: Secondary | ICD-10-CM | POA: Diagnosis not present

## 2018-03-08 DIAGNOSIS — K2270 Barrett's esophagus without dysplasia: Secondary | ICD-10-CM | POA: Diagnosis not present

## 2018-03-09 ENCOUNTER — Telehealth: Payer: Self-pay | Admitting: Internal Medicine

## 2018-03-09 DIAGNOSIS — L570 Actinic keratosis: Secondary | ICD-10-CM | POA: Diagnosis not present

## 2018-03-09 DIAGNOSIS — K2270 Barrett's esophagus without dysplasia: Secondary | ICD-10-CM | POA: Diagnosis not present

## 2018-03-09 DIAGNOSIS — H409 Unspecified glaucoma: Secondary | ICD-10-CM | POA: Diagnosis not present

## 2018-03-09 DIAGNOSIS — H547 Unspecified visual loss: Secondary | ICD-10-CM | POA: Diagnosis not present

## 2018-03-09 DIAGNOSIS — S51812D Laceration without foreign body of left forearm, subsequent encounter: Secondary | ICD-10-CM | POA: Diagnosis not present

## 2018-03-09 DIAGNOSIS — M6281 Muscle weakness (generalized): Secondary | ICD-10-CM | POA: Diagnosis not present

## 2018-03-09 DIAGNOSIS — D485 Neoplasm of uncertain behavior of skin: Secondary | ICD-10-CM | POA: Diagnosis not present

## 2018-03-09 DIAGNOSIS — C44319 Basal cell carcinoma of skin of other parts of face: Secondary | ICD-10-CM | POA: Diagnosis not present

## 2018-03-09 DIAGNOSIS — J42 Unspecified chronic bronchitis: Secondary | ICD-10-CM | POA: Diagnosis not present

## 2018-03-09 NOTE — Telephone Encounter (Signed)
Copied from [place] [phone]. Topic: Quick Communication - See Telephone Encounter >> Mar 09, 2018 10:41 AM Mylinda Latina, NT wrote: CRM for notification. See Telephone encounter for: 03/09/18. Sandy calling from [facility] is requesting verbal orders. The orders are move Speech Therapy eval to the week of 03/11/18. Please call back to provide these verbals CB# [phone]

## 2018-03-09 NOTE — Telephone Encounter (Signed)
Called and gave verbal orders to [place] at [facility] .

## 2018-03-12 DIAGNOSIS — J42 Unspecified chronic bronchitis: Secondary | ICD-10-CM | POA: Diagnosis not present

## 2018-03-12 DIAGNOSIS — S51812D Laceration without foreign body of left forearm, subsequent encounter: Secondary | ICD-10-CM | POA: Diagnosis not present

## 2018-03-12 DIAGNOSIS — K2270 Barrett's esophagus without dysplasia: Secondary | ICD-10-CM | POA: Diagnosis not present

## 2018-03-12 DIAGNOSIS — M6281 Muscle weakness (generalized): Secondary | ICD-10-CM | POA: Diagnosis not present

## 2018-03-12 DIAGNOSIS — H409 Unspecified glaucoma: Secondary | ICD-10-CM | POA: Diagnosis not present

## 2018-03-12 DIAGNOSIS — H547 Unspecified visual loss: Secondary | ICD-10-CM | POA: Diagnosis not present

## 2018-03-13 DIAGNOSIS — S51812D Laceration without foreign body of left forearm, subsequent encounter: Secondary | ICD-10-CM | POA: Diagnosis not present

## 2018-03-13 DIAGNOSIS — H903 Sensorineural hearing loss, bilateral: Secondary | ICD-10-CM | POA: Diagnosis not present

## 2018-03-13 DIAGNOSIS — H409 Unspecified glaucoma: Secondary | ICD-10-CM | POA: Diagnosis not present

## 2018-03-13 DIAGNOSIS — M6281 Muscle weakness (generalized): Secondary | ICD-10-CM | POA: Diagnosis not present

## 2018-03-13 DIAGNOSIS — H6123 Impacted cerumen, bilateral: Secondary | ICD-10-CM | POA: Diagnosis not present

## 2018-03-13 DIAGNOSIS — J42 Unspecified chronic bronchitis: Secondary | ICD-10-CM | POA: Diagnosis not present

## 2018-03-13 DIAGNOSIS — H547 Unspecified visual loss: Secondary | ICD-10-CM | POA: Diagnosis not present

## 2018-03-13 DIAGNOSIS — K2270 Barrett's esophagus without dysplasia: Secondary | ICD-10-CM | POA: Diagnosis not present

## 2018-03-16 ENCOUNTER — Telehealth: Payer: Self-pay | Admitting: Internal Medicine

## 2018-03-16 DIAGNOSIS — M6281 Muscle weakness (generalized): Secondary | ICD-10-CM | POA: Diagnosis not present

## 2018-03-16 DIAGNOSIS — K2270 Barrett's esophagus without dysplasia: Secondary | ICD-10-CM | POA: Diagnosis not present

## 2018-03-16 DIAGNOSIS — H409 Unspecified glaucoma: Secondary | ICD-10-CM | POA: Diagnosis not present

## 2018-03-16 DIAGNOSIS — H547 Unspecified visual loss: Secondary | ICD-10-CM | POA: Diagnosis not present

## 2018-03-16 DIAGNOSIS — S51812D Laceration without foreign body of left forearm, subsequent encounter: Secondary | ICD-10-CM | POA: Diagnosis not present

## 2018-03-16 DIAGNOSIS — J42 Unspecified chronic bronchitis: Secondary | ICD-10-CM | POA: Diagnosis not present

## 2018-03-16 NOTE — Telephone Encounter (Signed)
Copied from [place] [phone]. Topic: Inquiry >> Mar 16, 2018 10:21 AM Scherrie Gerlach wrote: Reason for CRM: will OT with [facility] calling to request verbal orders for OT 1 wk 3

## 2018-03-16 NOTE — Telephone Encounter (Signed)
Called and gave verbal order to Will at [facility] for patient to continue OT 1 week x 3.

## 2018-03-19 DIAGNOSIS — S51812D Laceration without foreign body of left forearm, subsequent encounter: Secondary | ICD-10-CM | POA: Diagnosis not present

## 2018-03-19 DIAGNOSIS — J42 Unspecified chronic bronchitis: Secondary | ICD-10-CM | POA: Diagnosis not present

## 2018-03-19 DIAGNOSIS — H409 Unspecified glaucoma: Secondary | ICD-10-CM | POA: Diagnosis not present

## 2018-03-19 DIAGNOSIS — M6281 Muscle weakness (generalized): Secondary | ICD-10-CM | POA: Diagnosis not present

## 2018-03-19 DIAGNOSIS — H547 Unspecified visual loss: Secondary | ICD-10-CM | POA: Diagnosis not present

## 2018-03-19 DIAGNOSIS — K2270 Barrett's esophagus without dysplasia: Secondary | ICD-10-CM | POA: Diagnosis not present

## 2018-03-20 ENCOUNTER — Telehealth: Payer: Self-pay

## 2018-03-20 NOTE — Telephone Encounter (Signed)
Copied from [place] [phone]. Topic: Referral - Question >> Mar 20, 2018 10:59 AM Vernona Rieger wrote: Reason for CRM: Mickel Baas, RN with [facility] called and said she received a referral for her. They received everything but the allergy list was blank. She is requesting another list of her allergies be sent to her [phone]  Allergy list has baeen faxed to the number listed to Mickel Baas at [facility].

## 2018-03-21 DIAGNOSIS — S51812D Laceration without foreign body of left forearm, subsequent encounter: Secondary | ICD-10-CM | POA: Diagnosis not present

## 2018-03-21 DIAGNOSIS — M6281 Muscle weakness (generalized): Secondary | ICD-10-CM | POA: Diagnosis not present

## 2018-03-21 DIAGNOSIS — H409 Unspecified glaucoma: Secondary | ICD-10-CM | POA: Diagnosis not present

## 2018-03-21 DIAGNOSIS — H547 Unspecified visual loss: Secondary | ICD-10-CM | POA: Diagnosis not present

## 2018-03-21 DIAGNOSIS — J42 Unspecified chronic bronchitis: Secondary | ICD-10-CM | POA: Diagnosis not present

## 2018-03-21 DIAGNOSIS — K2270 Barrett's esophagus without dysplasia: Secondary | ICD-10-CM | POA: Diagnosis not present

## 2018-03-22 DIAGNOSIS — K2270 Barrett's esophagus without dysplasia: Secondary | ICD-10-CM | POA: Diagnosis not present

## 2018-03-22 DIAGNOSIS — H547 Unspecified visual loss: Secondary | ICD-10-CM | POA: Diagnosis not present

## 2018-03-22 DIAGNOSIS — S51812D Laceration without foreign body of left forearm, subsequent encounter: Secondary | ICD-10-CM | POA: Diagnosis not present

## 2018-03-22 DIAGNOSIS — M6281 Muscle weakness (generalized): Secondary | ICD-10-CM | POA: Diagnosis not present

## 2018-03-22 DIAGNOSIS — H409 Unspecified glaucoma: Secondary | ICD-10-CM | POA: Diagnosis not present

## 2018-03-22 DIAGNOSIS — J42 Unspecified chronic bronchitis: Secondary | ICD-10-CM | POA: Diagnosis not present

## 2018-03-23 DIAGNOSIS — S51812D Laceration without foreign body of left forearm, subsequent encounter: Secondary | ICD-10-CM | POA: Diagnosis not present

## 2018-03-23 DIAGNOSIS — M6281 Muscle weakness (generalized): Secondary | ICD-10-CM | POA: Diagnosis not present

## 2018-03-23 DIAGNOSIS — H401433 Capsular glaucoma with pseudoexfoliation of lens, bilateral, severe stage: Secondary | ICD-10-CM | POA: Diagnosis not present

## 2018-03-23 DIAGNOSIS — H409 Unspecified glaucoma: Secondary | ICD-10-CM | POA: Diagnosis not present

## 2018-03-23 DIAGNOSIS — K2270 Barrett's esophagus without dysplasia: Secondary | ICD-10-CM | POA: Diagnosis not present

## 2018-03-23 DIAGNOSIS — H18232 Secondary corneal edema, left eye: Secondary | ICD-10-CM | POA: Diagnosis not present

## 2018-03-23 DIAGNOSIS — J42 Unspecified chronic bronchitis: Secondary | ICD-10-CM | POA: Diagnosis not present

## 2018-03-23 DIAGNOSIS — H547 Unspecified visual loss: Secondary | ICD-10-CM | POA: Diagnosis not present

## 2018-03-27 DIAGNOSIS — S51812D Laceration without foreign body of left forearm, subsequent encounter: Secondary | ICD-10-CM | POA: Diagnosis not present

## 2018-03-27 DIAGNOSIS — K2270 Barrett's esophagus without dysplasia: Secondary | ICD-10-CM | POA: Diagnosis not present

## 2018-03-27 DIAGNOSIS — M6281 Muscle weakness (generalized): Secondary | ICD-10-CM | POA: Diagnosis not present

## 2018-03-27 DIAGNOSIS — H409 Unspecified glaucoma: Secondary | ICD-10-CM | POA: Diagnosis not present

## 2018-03-27 DIAGNOSIS — H547 Unspecified visual loss: Secondary | ICD-10-CM | POA: Diagnosis not present

## 2018-03-27 DIAGNOSIS — J42 Unspecified chronic bronchitis: Secondary | ICD-10-CM | POA: Diagnosis not present

## 2018-03-29 DIAGNOSIS — K2270 Barrett's esophagus without dysplasia: Secondary | ICD-10-CM | POA: Diagnosis not present

## 2018-03-29 DIAGNOSIS — S51812D Laceration without foreign body of left forearm, subsequent encounter: Secondary | ICD-10-CM | POA: Diagnosis not present

## 2018-03-29 DIAGNOSIS — J42 Unspecified chronic bronchitis: Secondary | ICD-10-CM | POA: Diagnosis not present

## 2018-03-29 DIAGNOSIS — H547 Unspecified visual loss: Secondary | ICD-10-CM | POA: Diagnosis not present

## 2018-03-29 DIAGNOSIS — H409 Unspecified glaucoma: Secondary | ICD-10-CM | POA: Diagnosis not present

## 2018-03-29 DIAGNOSIS — M6281 Muscle weakness (generalized): Secondary | ICD-10-CM | POA: Diagnosis not present

## 2018-04-01 DIAGNOSIS — Z23 Encounter for immunization: Secondary | ICD-10-CM | POA: Diagnosis not present

## 2018-04-04 DIAGNOSIS — K2270 Barrett's esophagus without dysplasia: Secondary | ICD-10-CM | POA: Diagnosis not present

## 2018-04-04 DIAGNOSIS — J42 Unspecified chronic bronchitis: Secondary | ICD-10-CM | POA: Diagnosis not present

## 2018-04-04 DIAGNOSIS — S51812D Laceration without foreign body of left forearm, subsequent encounter: Secondary | ICD-10-CM | POA: Diagnosis not present

## 2018-04-04 DIAGNOSIS — H409 Unspecified glaucoma: Secondary | ICD-10-CM | POA: Diagnosis not present

## 2018-04-04 DIAGNOSIS — H547 Unspecified visual loss: Secondary | ICD-10-CM | POA: Diagnosis not present

## 2018-04-04 DIAGNOSIS — M6281 Muscle weakness (generalized): Secondary | ICD-10-CM | POA: Diagnosis not present

## 2018-04-05 ENCOUNTER — Telehealth: Payer: Self-pay | Admitting: Internal Medicine

## 2018-04-05 DIAGNOSIS — S51811A Laceration without foreign body of right forearm, initial encounter: Secondary | ICD-10-CM | POA: Diagnosis not present

## 2018-04-05 DIAGNOSIS — Z23 Encounter for immunization: Secondary | ICD-10-CM | POA: Diagnosis not present

## 2018-04-05 NOTE — Telephone Encounter (Signed)
Copied from [place] [phone]. Topic: Quick Communication - See Telephone Encounter >> Apr 05, 2018 10:34 AM Gardiner Ramus wrote: CRM for notification. See Telephone encounter for: 04/05/18. Sandy from [facility] called for verbal orders. She states that she would like a call back from the nurse. CL#[phone].

## 2018-04-05 NOTE — Telephone Encounter (Signed)
Called and gave [place] with [facility] verbal orders for patient.

## 2018-04-06 ENCOUNTER — Telehealth: Payer: Self-pay | Admitting: Internal Medicine

## 2018-04-06 DIAGNOSIS — S41119D Laceration without foreign body of unspecified upper arm, subsequent encounter: Secondary | ICD-10-CM

## 2018-04-06 NOTE — Telephone Encounter (Signed)
Duplicate.  See previous message.   

## 2018-04-06 NOTE — Telephone Encounter (Signed)
OK to place referral

## 2018-04-06 NOTE — Telephone Encounter (Signed)
Please advise 

## 2018-04-06 NOTE — Telephone Encounter (Signed)
We had schedule home health previously to see pt.  Are they still seeing her? If no, yes we can place order for home health.  I sent this message to both of you.

## 2018-04-06 NOTE — Telephone Encounter (Signed)
No home health is not still seeing patient per friend in home.

## 2018-04-06 NOTE — Telephone Encounter (Signed)
[facility] calling wanting a Home health referral for patient for nursing for daily bandage changes, called patient friend in house stated she hit her arm on filing cabinet on 04/05/18 and has skin tear. I have copied and pasted note from [facility] below.   1. Skin tear right forearm 84 year old female comes in today with complaints of the skin tear at the dorsal right forearm after hitting her arm against a drawer earlier today. Placed toilet paper on the skin tear site and wrapped and came in to be seen with. No purulent drainage from the site. Denies fever, chills. No head trauma loss of consciousness. Denies history of diabetes mellitus. Uncertain as to date of last tetanus vaccine.

## 2018-04-06 NOTE — Telephone Encounter (Signed)
Order placed for home health, but I doubt they will be able to go out this weekend.  If needs to be dressed this weekend, may need f/u at [facility] acute care until home health can get established.

## 2018-04-06 NOTE — Telephone Encounter (Signed)
Copied from [place] [id number]. Topic: Quick Communication - See Telephone Encounter >> Apr 06, 2018 11:22 AM Neva Seat wrote: Pt went to [facility] yesterday with a cut on her arm.  [facility] told her home nurse was supposed to come into the home to change bandage daily.  No one has called to schedule a time to come by.  Pt wants to know if this is something Dr. Nicki Reaper is suppose to schedule for pt.

## 2018-04-06 NOTE — Telephone Encounter (Signed)
Sherita Calling from [facility] states the patient needs Home Health RN for wound dressing changes for a week. She is wondering if an order can put for this. Please advise... CB# [phone]

## 2018-04-06 NOTE — Telephone Encounter (Signed)
Should patient be put on the nurse visit schedule, do we do bandage changes daily

## 2018-04-07 DIAGNOSIS — M6281 Muscle weakness (generalized): Secondary | ICD-10-CM | POA: Diagnosis not present

## 2018-04-07 DIAGNOSIS — K2270 Barrett's esophagus without dysplasia: Secondary | ICD-10-CM | POA: Diagnosis not present

## 2018-04-07 DIAGNOSIS — J42 Unspecified chronic bronchitis: Secondary | ICD-10-CM | POA: Diagnosis not present

## 2018-04-07 DIAGNOSIS — S51812D Laceration without foreign body of left forearm, subsequent encounter: Secondary | ICD-10-CM | POA: Diagnosis not present

## 2018-04-07 DIAGNOSIS — H409 Unspecified glaucoma: Secondary | ICD-10-CM | POA: Diagnosis not present

## 2018-04-07 DIAGNOSIS — H547 Unspecified visual loss: Secondary | ICD-10-CM | POA: Diagnosis not present

## 2018-04-09 NOTE — Telephone Encounter (Signed)
NO answer tried to reach patient to see if home health had contacted for dressing change no answer left message to call office.

## 2018-04-09 NOTE — Telephone Encounter (Signed)
Dressing change due this week for weekly changes.

## 2018-04-10 ENCOUNTER — Telehealth: Payer: Self-pay

## 2018-04-10 DIAGNOSIS — S51812D Laceration without foreign body of left forearm, subsequent encounter: Secondary | ICD-10-CM | POA: Diagnosis not present

## 2018-04-10 DIAGNOSIS — K2270 Barrett's esophagus without dysplasia: Secondary | ICD-10-CM | POA: Diagnosis not present

## 2018-04-10 DIAGNOSIS — H547 Unspecified visual loss: Secondary | ICD-10-CM | POA: Diagnosis not present

## 2018-04-10 DIAGNOSIS — H409 Unspecified glaucoma: Secondary | ICD-10-CM | POA: Diagnosis not present

## 2018-04-10 DIAGNOSIS — J42 Unspecified chronic bronchitis: Secondary | ICD-10-CM | POA: Diagnosis not present

## 2018-04-10 DIAGNOSIS — M6281 Muscle weakness (generalized): Secondary | ICD-10-CM | POA: Diagnosis not present

## 2018-04-10 NOTE — Telephone Encounter (Signed)
Copied from [place] [phone]. Topic: Quick Communication - Office Called Patient >> Apr 10, 2018  4:48 PM Neva Seat wrote: Pt is calling Juliann Pulse back.  Please call her back asap. Called patient and she states that home health has been coming out tot bandgae her arm daily.

## 2018-04-12 DIAGNOSIS — H409 Unspecified glaucoma: Secondary | ICD-10-CM | POA: Diagnosis not present

## 2018-04-12 DIAGNOSIS — J42 Unspecified chronic bronchitis: Secondary | ICD-10-CM | POA: Diagnosis not present

## 2018-04-12 DIAGNOSIS — K2270 Barrett's esophagus without dysplasia: Secondary | ICD-10-CM | POA: Diagnosis not present

## 2018-04-12 DIAGNOSIS — H547 Unspecified visual loss: Secondary | ICD-10-CM | POA: Diagnosis not present

## 2018-04-12 DIAGNOSIS — M6281 Muscle weakness (generalized): Secondary | ICD-10-CM | POA: Diagnosis not present

## 2018-04-12 DIAGNOSIS — S51812D Laceration without foreign body of left forearm, subsequent encounter: Secondary | ICD-10-CM | POA: Diagnosis not present

## 2018-04-13 DIAGNOSIS — H18232 Secondary corneal edema, left eye: Secondary | ICD-10-CM | POA: Diagnosis not present

## 2018-04-13 DIAGNOSIS — H401433 Capsular glaucoma with pseudoexfoliation of lens, bilateral, severe stage: Secondary | ICD-10-CM | POA: Diagnosis not present

## 2018-04-16 ENCOUNTER — Telehealth: Payer: Self-pay

## 2018-04-16 DIAGNOSIS — K2270 Barrett's esophagus without dysplasia: Secondary | ICD-10-CM | POA: Diagnosis not present

## 2018-04-16 DIAGNOSIS — S51812D Laceration without foreign body of left forearm, subsequent encounter: Secondary | ICD-10-CM | POA: Diagnosis not present

## 2018-04-16 DIAGNOSIS — H409 Unspecified glaucoma: Secondary | ICD-10-CM | POA: Diagnosis not present

## 2018-04-16 DIAGNOSIS — H547 Unspecified visual loss: Secondary | ICD-10-CM | POA: Diagnosis not present

## 2018-04-16 DIAGNOSIS — M6281 Muscle weakness (generalized): Secondary | ICD-10-CM | POA: Diagnosis not present

## 2018-04-16 DIAGNOSIS — J42 Unspecified chronic bronchitis: Secondary | ICD-10-CM | POA: Diagnosis not present

## 2018-04-16 NOTE — Telephone Encounter (Signed)
Copied from [place] [phone]. Topic: General - Other >> Apr 16, 2018  1:52 PM Margot Ables wrote: Reason for CRM: Chrys Racer states she is returning a call. She said someone called her this morning. She said she thinks it may be forms to go to independent living. She said she thinks pt changed her mind again. Please call back.

## 2018-04-18 DIAGNOSIS — S51812D Laceration without foreign body of left forearm, subsequent encounter: Secondary | ICD-10-CM | POA: Diagnosis not present

## 2018-04-18 DIAGNOSIS — J42 Unspecified chronic bronchitis: Secondary | ICD-10-CM | POA: Diagnosis not present

## 2018-04-18 DIAGNOSIS — H547 Unspecified visual loss: Secondary | ICD-10-CM | POA: Diagnosis not present

## 2018-04-18 DIAGNOSIS — H409 Unspecified glaucoma: Secondary | ICD-10-CM | POA: Diagnosis not present

## 2018-04-18 DIAGNOSIS — K2270 Barrett's esophagus without dysplasia: Secondary | ICD-10-CM | POA: Diagnosis not present

## 2018-04-18 DIAGNOSIS — M6281 Muscle weakness (generalized): Secondary | ICD-10-CM | POA: Diagnosis not present

## 2018-04-20 DIAGNOSIS — C44319 Basal cell carcinoma of skin of other parts of face: Secondary | ICD-10-CM | POA: Diagnosis not present

## 2018-04-20 DIAGNOSIS — L570 Actinic keratosis: Secondary | ICD-10-CM | POA: Diagnosis not present

## 2018-04-24 DIAGNOSIS — H547 Unspecified visual loss: Secondary | ICD-10-CM | POA: Diagnosis not present

## 2018-04-24 DIAGNOSIS — K2270 Barrett's esophagus without dysplasia: Secondary | ICD-10-CM | POA: Diagnosis not present

## 2018-04-24 DIAGNOSIS — M6281 Muscle weakness (generalized): Secondary | ICD-10-CM | POA: Diagnosis not present

## 2018-04-24 DIAGNOSIS — H409 Unspecified glaucoma: Secondary | ICD-10-CM | POA: Diagnosis not present

## 2018-04-24 DIAGNOSIS — J42 Unspecified chronic bronchitis: Secondary | ICD-10-CM | POA: Diagnosis not present

## 2018-04-24 DIAGNOSIS — S51812D Laceration without foreign body of left forearm, subsequent encounter: Secondary | ICD-10-CM | POA: Diagnosis not present

## 2018-04-24 NOTE — Telephone Encounter (Signed)
Pt has changed her mind and would like to stay at home

## 2018-04-27 ENCOUNTER — Telehealth: Payer: Self-pay | Admitting: Internal Medicine

## 2018-04-27 DIAGNOSIS — S51812D Laceration without foreign body of left forearm, subsequent encounter: Secondary | ICD-10-CM | POA: Diagnosis not present

## 2018-04-27 DIAGNOSIS — H409 Unspecified glaucoma: Secondary | ICD-10-CM | POA: Diagnosis not present

## 2018-04-27 DIAGNOSIS — H547 Unspecified visual loss: Secondary | ICD-10-CM | POA: Diagnosis not present

## 2018-04-27 DIAGNOSIS — M6281 Muscle weakness (generalized): Secondary | ICD-10-CM | POA: Diagnosis not present

## 2018-04-27 DIAGNOSIS — J42 Unspecified chronic bronchitis: Secondary | ICD-10-CM | POA: Diagnosis not present

## 2018-04-27 DIAGNOSIS — K2270 Barrett's esophagus without dysplasia: Secondary | ICD-10-CM | POA: Diagnosis not present

## 2018-04-27 NOTE — Telephone Encounter (Signed)
Copied from [place] [phone]. Topic: Quick Communication - See Telephone Encounter >> Apr 27, 2018 12:41 PM Rutherford Nail, NT wrote: CRM for notification. See Telephone encounter for: 04/27/18. Mickel Baas, RN from [facility] calling and states that the patient has an appointment next week (05/02/18), but wanted to let Dr Nicki Reaper know that the patient is having a marked decline with memory. States that she is very concerned with her safety at home. States that they are treating her for wounds from falls. Has significantly declined within the last 7 days.  States that her short term memory is completely gone. States that they have been trying to get her into a facility but patient refuses.   CB#: [phone]

## 2018-04-27 NOTE — Telephone Encounter (Signed)
This pt continues to decline.  Have tried on multiple occasions to get her to agree to assisted living, etc.  She is now falling and per note memory getting worse.  We have a friend as her contact.   This is the pt we discussed.

## 2018-04-27 NOTE — Telephone Encounter (Signed)
This was supposed to be routed to you 

## 2018-04-27 NOTE — Telephone Encounter (Signed)
Concern for patients health

## 2018-04-27 NOTE — Telephone Encounter (Signed)
Spoke with Ander Purpura. She stated patient has had a significant decline within the last week. Patient is not safe to stay at home anymore. She is falling frequently and memory is declining. Home health spent 2 hours with pt today explaining that it is unsafe for her to be at home. Lauren stated that she had to keep re-orienting patient to their conversation. She has an appt with [place] next week.

## 2018-04-30 DIAGNOSIS — M6281 Muscle weakness (generalized): Secondary | ICD-10-CM | POA: Diagnosis not present

## 2018-04-30 DIAGNOSIS — K2270 Barrett's esophagus without dysplasia: Secondary | ICD-10-CM | POA: Diagnosis not present

## 2018-04-30 DIAGNOSIS — S51812D Laceration without foreign body of left forearm, subsequent encounter: Secondary | ICD-10-CM | POA: Diagnosis not present

## 2018-04-30 DIAGNOSIS — H409 Unspecified glaucoma: Secondary | ICD-10-CM | POA: Diagnosis not present

## 2018-04-30 DIAGNOSIS — H547 Unspecified visual loss: Secondary | ICD-10-CM | POA: Diagnosis not present

## 2018-04-30 DIAGNOSIS — J42 Unspecified chronic bronchitis: Secondary | ICD-10-CM | POA: Diagnosis not present

## 2018-05-01 DIAGNOSIS — M150 Primary generalized (osteo)arthritis: Secondary | ICD-10-CM | POA: Diagnosis not present

## 2018-05-01 DIAGNOSIS — H409 Unspecified glaucoma: Secondary | ICD-10-CM | POA: Diagnosis not present

## 2018-05-01 DIAGNOSIS — R413 Other amnesia: Secondary | ICD-10-CM | POA: Diagnosis not present

## 2018-05-01 DIAGNOSIS — R41841 Cognitive communication deficit: Secondary | ICD-10-CM | POA: Diagnosis not present

## 2018-05-01 DIAGNOSIS — J42 Unspecified chronic bronchitis: Secondary | ICD-10-CM | POA: Diagnosis not present

## 2018-05-01 DIAGNOSIS — S51811D Laceration without foreign body of right forearm, subsequent encounter: Secondary | ICD-10-CM | POA: Diagnosis not present

## 2018-05-01 NOTE — Telephone Encounter (Signed)
Left message with DSS this morning to return call urgent request.

## 2018-05-01 NOTE — Telephone Encounter (Signed)
Contacted DSS through the Emergency Line today due to no answer on normal DSS line. Spoke with social worker Kenneth he stated that investigation would takeplace and that office will be notified of outcome. 

## 2018-05-02 ENCOUNTER — Ambulatory Visit (INDEPENDENT_AMBULATORY_CARE_PROVIDER_SITE_OTHER): Payer: Medicare Other | Admitting: Internal Medicine

## 2018-05-02 DIAGNOSIS — E7800 Pure hypercholesterolemia, unspecified: Secondary | ICD-10-CM

## 2018-05-02 DIAGNOSIS — F32A Depression, unspecified: Secondary | ICD-10-CM

## 2018-05-02 DIAGNOSIS — K2100 Gastro-esophageal reflux disease with esophagitis, without bleeding: Secondary | ICD-10-CM

## 2018-05-02 DIAGNOSIS — R413 Other amnesia: Secondary | ICD-10-CM | POA: Diagnosis not present

## 2018-05-02 DIAGNOSIS — K2270 Barrett's esophagus without dysplasia: Secondary | ICD-10-CM

## 2018-05-02 DIAGNOSIS — F329 Major depressive disorder, single episode, unspecified: Secondary | ICD-10-CM

## 2018-05-02 NOTE — Progress Notes (Signed)
Patient ID: Christy Cisneros, female   DOB: 10/18/1919, 84 y.o.   MRN: [id number]   Subjective:    Patient ID: Christy Cisneros, female    DOB: 10-04-1919, 84 y.o.   MRN: [id number]  HPI  Patient here for a scheduled follow up.  She is accompanied by her friend Rhunette Croft [phone]).   History obtained from both of them.   Home health nurse has been coming out dressing her arm wound.  Called with concern regarding pts safety and concern about her staying by herself.  Department of Social Services contacted.  Evaluation and discussion today, pt doing better.  Stays active.  Still walks. No chest pain.  No sob.  No acid reflux.  No abdominal pain.  Does report some concern regarding possible depression.  States that all of her siblings are deceased.  Some loneliness.  Discussed with her today.  She declines medication.  Discussed counseling.  She may be agreeable to counseling.  Discussed her safety and concern regarding her staying at home by herself.  Discussed assisted living.  Ms Mancel Bale informs me that pt has help at home.  States Jan - walks with her.  Lesleigh Noe - helps her with her laundry.  Almyra Free takes her to the grocery store and checks her refrigerator to confirm food, etc.  Freda Munro - does her cleaning and Arbie Cookey helps her with general errands and chores.  States she has help at home.  Pt denies any chest pain.  No sob.  No acid reflux.  No abdominal pain.  Bowels moving.     Past Medical History:  Diagnosis Date  . Barrett's esophagus with esophagitis   . Chronic bronchitis ([place])   . GERD (gastroesophageal reflux disease)   . Glaucoma   . Hypercholesteremia   . Osteoarthritis   . Osteoporosis   . Sleep apnea    has CPAP  . TIA (transient ischemic attack)    Past Surgical History:  Procedure Laterality Date  . BUNIONECTOMY     left  . ROTATOR CUFF REPAIR     right   Family History  Problem Relation Age of Onset  . Breast cancer Sister   . Heart disease Mother     Social History   Socioeconomic History  . Marital status: Single    Spouse name: Not on file  . Number of children: Not on file  . Years of education: Not on file  . Highest education level: Not on file  Occupational History  . Not on file  Social Needs  . Financial resource strain: Not on file  . Food insecurity:    Worry: Not on file    Inability: Not on file  . Transportation needs:    Medical: Not on file    Non-medical: Not on file  Tobacco Use  . Smoking status: Never Smoker  . Smokeless tobacco: Never Used  Substance and Sexual Activity  . Alcohol use: Yes    Alcohol/week: 0.0 standard drinks    Comment: wine/brandy occasional  . Drug use: No  . Sexual activity: Never  Lifestyle  . Physical activity:    Days per week: 7 days    Minutes per session: 40 min  . Stress: Only a little  Relationships  . Social connections:    Talks on phone: Patient refused    Gets together: Patient refused    Attends religious service: Patient refused    Active member of club or organization: Patient refused  Attends meetings of clubs or organizations: Patient refused    Relationship status: Patient refused  Other Topics Concern  . Not on file  Social History Narrative   Lives alone no kids    Outpatient Encounter Medications as of 05/02/2018  Medication Sig  . Clobetasol Prop Emollient Base (CLOBETASOL PROPIONATE E) 0.05 % emollient cream Apply 1 application topically 2 (two) times daily. Not face underarms or groin  . dorzolamide-timolol (COSOPT) 22.3-6.8 MG/ML ophthalmic solution   . hydrOXYzine (ATARAX/VISTARIL) 10 MG tablet Take 1 tablet (10 mg total) by mouth 2 (two) times daily as needed.  . Hypochlorous Acid 0.012 % SOLN Apply topically.  . nystatin cream (MYCOSTATIN) Apply 1 application topically 2 (two) times daily.  Marland Kitchen omeprazole (PRILOSEC) 20 MG capsule TAKE ONE CAPSULE TWICE A DAY BEFORE MEALS  . prednisoLONE acetate (PRED FORTE) 1 % ophthalmic suspension   .  tobramycin (TOBREX) 0.3 % ophthalmic solution   . triamcinolone cream (KENALOG) 0.1 % Apply 1 application topically 2 (two) times daily. Prn not to face, axilla, groin   No facility-administered encounter medications on file as of 05/02/2018.     Review of Systems  Constitutional: Negative for appetite change and unexpected weight change.  HENT: Negative for congestion and sinus pressure.   Respiratory: Negative for cough, chest tightness and shortness of breath.   Cardiovascular: Negative for chest pain, palpitations and leg swelling.  Gastrointestinal: Negative for abdominal pain, diarrhea, nausea and vomiting.  Genitourinary: Negative for difficulty urinating and dysuria.  Musculoskeletal: Negative for joint swelling and myalgias.  Skin: Negative for color change and rash.  Neurological: Negative for dizziness, light-headedness and headaches.  Psychiatric/Behavioral: Negative for agitation and dysphoric mood.       Objective:    Physical Exam  Constitutional: She appears well-developed and well-nourished. No distress.  HENT:  Nose: Nose normal.  Mouth/Throat: Oropharynx is clear and moist.  Neck: Neck supple. No thyromegaly present.  Cardiovascular: Normal rate and regular rhythm.  Pulmonary/Chest: Breath sounds normal. No respiratory distress. She has no wheezes.  Abdominal: Soft. Bowel sounds are normal. There is no tenderness.  Musculoskeletal: She exhibits no edema or tenderness.  Lymphadenopathy:    She has no cervical adenopathy.  Skin: No rash noted. No erythema.  Psychiatric: She has a normal mood and affect. Her behavior is normal.    BP 140/82 (BP Location: Left Arm, Patient Position: Sitting, Cuff Size: Normal)   Pulse 62   Temp 97.7 F (36.5 C) (Oral)   Resp 18   Wt 102 lb 3.2 oz (46.4 kg)   SpO2 97%   BMI 18.69 kg/m  Wt Readings from Last 3 Encounters:  05/02/18 102 lb 3.2 oz (46.4 kg)  02/26/18 98 lb 9.6 oz (44.7 kg)  01/14/18 120 lb (54.4 kg)      Lab Results  Component Value Date   WBC 8.3 11/13/2017   HGB 12.8 11/13/2017   HCT 38.7 11/13/2017   PLT 348.0 11/13/2017   GLUCOSE 97 11/02/2017   CHOL 207 (H) 03/17/2017   TRIG 93 03/17/2017   HDL 72 03/17/2017   LDLCALC 116 (H) 03/17/2017   ALT 15 11/02/2017   AST 19 11/02/2017   NA 139 11/02/2017   K 3.5 11/02/2017   CL 104 11/02/2017   CREATININE 1.00 11/02/2017   BUN 26 (H) 11/02/2017   CO2 27 11/02/2017   TSH 1.47 08/30/2017   INR 0.96 03/16/2017   HGBA1C 5.4 03/17/2017       Assessment & Plan:  Problem List Items Addressed This Visit    BARRETTS ESOPHAGUS    Has declined further GI evaluation.  Discussed the need to continue prilosec.        Depression    Concern regarding depression as outlined.  Discussed with her today.  She declines medication.  Discussed counseling.       GERD (gastroesophageal reflux disease)    Reports no significant problems.  Continue omeprazole.        HYPERCHOLESTEROLEMIA     Follow lipid panel.       Memory change    Has previously seen neurology.  Note reviewed.  Have discussed assisted living.  She has declined.  Has help at home as outlined.            Einar Pheasant, MD

## 2018-05-03 DIAGNOSIS — M150 Primary generalized (osteo)arthritis: Secondary | ICD-10-CM | POA: Diagnosis not present

## 2018-05-03 DIAGNOSIS — S51811D Laceration without foreign body of right forearm, subsequent encounter: Secondary | ICD-10-CM | POA: Diagnosis not present

## 2018-05-03 DIAGNOSIS — J42 Unspecified chronic bronchitis: Secondary | ICD-10-CM | POA: Diagnosis not present

## 2018-05-03 DIAGNOSIS — R41841 Cognitive communication deficit: Secondary | ICD-10-CM | POA: Diagnosis not present

## 2018-05-03 DIAGNOSIS — H409 Unspecified glaucoma: Secondary | ICD-10-CM | POA: Diagnosis not present

## 2018-05-03 DIAGNOSIS — R413 Other amnesia: Secondary | ICD-10-CM | POA: Diagnosis not present

## 2018-05-06 ENCOUNTER — Encounter: Payer: Self-pay | Admitting: Internal Medicine

## 2018-05-06 DIAGNOSIS — F32A Depression, unspecified: Secondary | ICD-10-CM | POA: Insufficient documentation

## 2018-05-06 DIAGNOSIS — F329 Major depressive disorder, single episode, unspecified: Secondary | ICD-10-CM | POA: Insufficient documentation

## 2018-05-06 NOTE — Assessment & Plan Note (Signed)
Reports no significant problems.  Continue omeprazole.

## 2018-05-06 NOTE — Assessment & Plan Note (Signed)
Has previously seen neurology.  Note reviewed.  Have discussed assisted living.  She has declined.  Has help at home as outlined.

## 2018-05-06 NOTE — Assessment & Plan Note (Signed)
Follow lipid panel.   

## 2018-05-06 NOTE — Assessment & Plan Note (Signed)
Concern regarding depression as outlined.  Discussed with her today.  She declines medication.  Discussed counseling.

## 2018-05-06 NOTE — Assessment & Plan Note (Signed)
Has declined further GI evaluation.  Discussed the need to continue prilosec.

## 2018-05-07 DIAGNOSIS — H401433 Capsular glaucoma with pseudoexfoliation of lens, bilateral, severe stage: Secondary | ICD-10-CM | POA: Diagnosis not present

## 2018-05-07 DIAGNOSIS — M150 Primary generalized (osteo)arthritis: Secondary | ICD-10-CM | POA: Diagnosis not present

## 2018-05-07 DIAGNOSIS — S51811D Laceration without foreign body of right forearm, subsequent encounter: Secondary | ICD-10-CM | POA: Diagnosis not present

## 2018-05-07 DIAGNOSIS — R413 Other amnesia: Secondary | ICD-10-CM | POA: Diagnosis not present

## 2018-05-07 DIAGNOSIS — J42 Unspecified chronic bronchitis: Secondary | ICD-10-CM | POA: Diagnosis not present

## 2018-05-07 DIAGNOSIS — R41841 Cognitive communication deficit: Secondary | ICD-10-CM | POA: Diagnosis not present

## 2018-05-07 DIAGNOSIS — H409 Unspecified glaucoma: Secondary | ICD-10-CM | POA: Diagnosis not present

## 2018-05-07 DIAGNOSIS — H18232 Secondary corneal edema, left eye: Secondary | ICD-10-CM | POA: Diagnosis not present

## 2018-05-07 DIAGNOSIS — H10232 Serous conjunctivitis, except viral, left eye: Secondary | ICD-10-CM | POA: Diagnosis not present

## 2018-05-08 ENCOUNTER — Telehealth: Payer: Self-pay

## 2018-05-08 NOTE — Telephone Encounter (Signed)
Copied from [place] [phone]. Topic: General - Other >> May 08, 2018 10:47 AM Yvette Rack wrote: Reason for CRM: pt friend Pamala Hurry [phone] calling to speak with Larena Glassman about pt  social work visit friend was rude had to explain that we are not at the practice she was upset why she couldn't speak with Larena Glassman

## 2018-05-09 NOTE — Telephone Encounter (Signed)
Called patient friend she wanted to advise Dr. Nicki Reaper that she had spoken with DSS and advised DSS of the [facility] in place to help patient with ADLs, The socialworker name is Merilynn Finland phone number [phone]. Patient wanted to know if there is a counselor that could come to home to speak with about loneliness. Patient also would like DME order for Rolator walker .

## 2018-05-09 NOTE — Telephone Encounter (Signed)
I have tried to call Merilynn Finland to discuss.  Left message.  I will be out of the office this pm.  If she calls back, can you talk with her to find out about the visit.  Also, I am ok with DME for walker.  She has home health in place now.  I am not sure that they have anything in place for someone to come out to her house for counseling, but we can give some names of counselors that she could see as outpt.   Thanks

## 2018-05-10 DIAGNOSIS — S51811D Laceration without foreign body of right forearm, subsequent encounter: Secondary | ICD-10-CM | POA: Diagnosis not present

## 2018-05-10 DIAGNOSIS — R413 Other amnesia: Secondary | ICD-10-CM | POA: Diagnosis not present

## 2018-05-10 DIAGNOSIS — R41841 Cognitive communication deficit: Secondary | ICD-10-CM | POA: Diagnosis not present

## 2018-05-10 DIAGNOSIS — H409 Unspecified glaucoma: Secondary | ICD-10-CM | POA: Diagnosis not present

## 2018-05-10 DIAGNOSIS — M150 Primary generalized (osteo)arthritis: Secondary | ICD-10-CM | POA: Diagnosis not present

## 2018-05-10 DIAGNOSIS — J42 Unspecified chronic bronchitis: Secondary | ICD-10-CM | POA: Diagnosis not present

## 2018-05-14 DIAGNOSIS — S51811D Laceration without foreign body of right forearm, subsequent encounter: Secondary | ICD-10-CM | POA: Diagnosis not present

## 2018-05-14 DIAGNOSIS — R41841 Cognitive communication deficit: Secondary | ICD-10-CM | POA: Diagnosis not present

## 2018-05-14 DIAGNOSIS — R413 Other amnesia: Secondary | ICD-10-CM | POA: Diagnosis not present

## 2018-05-14 DIAGNOSIS — J42 Unspecified chronic bronchitis: Secondary | ICD-10-CM | POA: Diagnosis not present

## 2018-05-14 DIAGNOSIS — M150 Primary generalized (osteo)arthritis: Secondary | ICD-10-CM | POA: Diagnosis not present

## 2018-05-14 DIAGNOSIS — H409 Unspecified glaucoma: Secondary | ICD-10-CM | POA: Diagnosis not present

## 2018-05-14 NOTE — Telephone Encounter (Signed)
Spoke with patient Education officer, museum and patient has agreed to [employer].

## 2018-05-15 NOTE — Telephone Encounter (Signed)
Called [facility] left message for Nathaneil Canary to return call to office.

## 2018-05-17 DIAGNOSIS — R413 Other amnesia: Secondary | ICD-10-CM | POA: Diagnosis not present

## 2018-05-17 DIAGNOSIS — J42 Unspecified chronic bronchitis: Secondary | ICD-10-CM | POA: Diagnosis not present

## 2018-05-17 DIAGNOSIS — S51811D Laceration without foreign body of right forearm, subsequent encounter: Secondary | ICD-10-CM | POA: Diagnosis not present

## 2018-05-17 DIAGNOSIS — M150 Primary generalized (osteo)arthritis: Secondary | ICD-10-CM | POA: Diagnosis not present

## 2018-05-17 DIAGNOSIS — H409 Unspecified glaucoma: Secondary | ICD-10-CM | POA: Diagnosis not present

## 2018-05-17 DIAGNOSIS — R41841 Cognitive communication deficit: Secondary | ICD-10-CM | POA: Diagnosis not present

## 2018-05-22 DIAGNOSIS — R41841 Cognitive communication deficit: Secondary | ICD-10-CM | POA: Diagnosis not present

## 2018-05-22 DIAGNOSIS — J42 Unspecified chronic bronchitis: Secondary | ICD-10-CM | POA: Diagnosis not present

## 2018-05-22 DIAGNOSIS — M150 Primary generalized (osteo)arthritis: Secondary | ICD-10-CM | POA: Diagnosis not present

## 2018-05-22 DIAGNOSIS — S51811D Laceration without foreign body of right forearm, subsequent encounter: Secondary | ICD-10-CM | POA: Diagnosis not present

## 2018-05-22 DIAGNOSIS — H409 Unspecified glaucoma: Secondary | ICD-10-CM | POA: Diagnosis not present

## 2018-05-22 DIAGNOSIS — R413 Other amnesia: Secondary | ICD-10-CM | POA: Diagnosis not present

## 2018-05-22 NOTE — Telephone Encounter (Signed)
Copied from [place] [phone]. Topic: Referral - Status >> May 22, 2018  3:22 PM Sheran Luz wrote: Reason for CRM: Hezzie Bump, DSS adult social worker, calling to check status of referral to [facility]. Requesting call back from [place] to discuss.

## 2018-05-23 NOTE — Telephone Encounter (Signed)
Talked with Glenda at [facility] she is going to call DSS and get with them to gout and talk to patient and get her an Elder law attorney to help he r not lose her property or savings if DSS and medicaid step in these things will still remain the patients.

## 2018-05-23 NOTE — Telephone Encounter (Signed)
Ok.  Do I need to do anything regarding a referral to [facility]?  Also, let notify Ms Kochel that if we are unable to get a counselor to her house that names:  Karen San Marino (counselor).  Also, Dr Nicolasa Ducking - psychiatrist.  Can also come to our office with our counselor.

## 2018-05-24 DIAGNOSIS — S51811D Laceration without foreign body of right forearm, subsequent encounter: Secondary | ICD-10-CM | POA: Diagnosis not present

## 2018-05-24 DIAGNOSIS — J42 Unspecified chronic bronchitis: Secondary | ICD-10-CM | POA: Diagnosis not present

## 2018-05-24 DIAGNOSIS — R413 Other amnesia: Secondary | ICD-10-CM | POA: Diagnosis not present

## 2018-05-24 DIAGNOSIS — M150 Primary generalized (osteo)arthritis: Secondary | ICD-10-CM | POA: Diagnosis not present

## 2018-05-24 DIAGNOSIS — R41841 Cognitive communication deficit: Secondary | ICD-10-CM | POA: Diagnosis not present

## 2018-05-24 DIAGNOSIS — H409 Unspecified glaucoma: Secondary | ICD-10-CM | POA: Diagnosis not present

## 2018-05-28 DIAGNOSIS — J42 Unspecified chronic bronchitis: Secondary | ICD-10-CM | POA: Diagnosis not present

## 2018-05-28 DIAGNOSIS — R413 Other amnesia: Secondary | ICD-10-CM | POA: Diagnosis not present

## 2018-05-28 DIAGNOSIS — S51811D Laceration without foreign body of right forearm, subsequent encounter: Secondary | ICD-10-CM | POA: Diagnosis not present

## 2018-05-28 DIAGNOSIS — H409 Unspecified glaucoma: Secondary | ICD-10-CM | POA: Diagnosis not present

## 2018-05-28 DIAGNOSIS — M150 Primary generalized (osteo)arthritis: Secondary | ICD-10-CM | POA: Diagnosis not present

## 2018-05-28 DIAGNOSIS — R41841 Cognitive communication deficit: Secondary | ICD-10-CM | POA: Diagnosis not present

## 2018-05-29 DIAGNOSIS — H18232 Secondary corneal edema, left eye: Secondary | ICD-10-CM | POA: Diagnosis not present

## 2018-05-29 DIAGNOSIS — H10232 Serous conjunctivitis, except viral, left eye: Secondary | ICD-10-CM | POA: Diagnosis not present

## 2018-05-29 DIAGNOSIS — H401433 Capsular glaucoma with pseudoexfoliation of lens, bilateral, severe stage: Secondary | ICD-10-CM | POA: Diagnosis not present

## 2018-06-04 DIAGNOSIS — M810 Age-related osteoporosis without current pathological fracture: Secondary | ICD-10-CM | POA: Diagnosis not present

## 2018-06-05 DIAGNOSIS — B351 Tinea unguium: Secondary | ICD-10-CM | POA: Diagnosis not present

## 2018-06-05 DIAGNOSIS — M79674 Pain in right toe(s): Secondary | ICD-10-CM | POA: Diagnosis not present

## 2018-06-05 DIAGNOSIS — M79675 Pain in left toe(s): Secondary | ICD-10-CM | POA: Diagnosis not present

## 2018-06-07 ENCOUNTER — Telehealth: Payer: Self-pay | Admitting: Family Medicine

## 2018-06-07 DIAGNOSIS — M150 Primary generalized (osteo)arthritis: Secondary | ICD-10-CM | POA: Diagnosis not present

## 2018-06-07 DIAGNOSIS — F339 Major depressive disorder, recurrent, unspecified: Secondary | ICD-10-CM

## 2018-06-07 DIAGNOSIS — S51811D Laceration without foreign body of right forearm, subsequent encounter: Secondary | ICD-10-CM | POA: Diagnosis not present

## 2018-06-07 DIAGNOSIS — R41841 Cognitive communication deficit: Secondary | ICD-10-CM | POA: Diagnosis not present

## 2018-06-07 DIAGNOSIS — J42 Unspecified chronic bronchitis: Secondary | ICD-10-CM | POA: Diagnosis not present

## 2018-06-07 DIAGNOSIS — H409 Unspecified glaucoma: Secondary | ICD-10-CM | POA: Diagnosis not present

## 2018-06-07 DIAGNOSIS — R413 Other amnesia: Secondary | ICD-10-CM | POA: Diagnosis not present

## 2018-06-07 NOTE — Telephone Encounter (Signed)
I spoke with you about this pt this morning.

## 2018-06-07 NOTE — Telephone Encounter (Signed)
This is a Dr. Scott patient  

## 2018-06-07 NOTE — Telephone Encounter (Signed)
Copied from [place] [phone]. Topic: General - Other >> Jun 07, 2018  9:32 AM Cecelia Byars, NT wrote: Reason for LAG:TXMIW from [facility] called and said Dr Derrel Nip made a home visit with the patient for depression. No one has followed up with her , she is really disappointed she has not heard from any one at the practice for follow  up. The  patient does not want to take medication, .she states  she really appreciates that Dr Derrel Nip  came out to  see.her. Santiago Glad from [facility] states this is the last visit due to the patient's wound  being healed  [phone] or call the office [phone] with any questions

## 2018-06-07 NOTE — Telephone Encounter (Signed)
Pt. Is a pt. Of [facility] not [place]. Please disregard the error.

## 2018-06-11 NOTE — Telephone Encounter (Signed)
Ok

## 2018-06-11 NOTE — Telephone Encounter (Signed)
Can we try a referral to [facility] ?

## 2018-06-18 NOTE — Telephone Encounter (Signed)
Order placed for [facility] referral.  Please notify pt.  Thanks

## 2018-06-18 NOTE — Telephone Encounter (Signed)
Christy Pulse, do I need to order or do anything for [facility].  Just let me know and please call pt with update.

## 2018-06-18 NOTE — Telephone Encounter (Signed)
Patient calling and would like an update on what is next? States that she has not heard anything after her home visit with Dr Derrel Nip regarding her depression. States that she doesn't want to be forgotten and would like a phone call with an update.

## 2018-06-18 NOTE — Telephone Encounter (Signed)
You would need to place a referral for transportation or a therapist to go out to home to [facility].

## 2018-06-18 NOTE — Telephone Encounter (Signed)
Please advise 

## 2018-06-18 NOTE — Telephone Encounter (Signed)
I do not see the referral to [facility]

## 2018-06-19 NOTE — Telephone Encounter (Signed)
Please advise on ordering imaging.

## 2018-06-19 NOTE — Telephone Encounter (Signed)
I spoke with Ms. Christy Cisneros & with patient present to let her know [facility] referral was placed.

## 2018-06-19 NOTE — Telephone Encounter (Signed)
Patient is c/o of left sided back pain, down her left leg that she noticed a few days ago. She has not has any falls or injuries to cause this. She is agreeable to come in for an appt if needed but would also be okay with just coming in for x-ray if Dr. Nicki Reaper is agreeable. Patient says this is not an urgent request but wanted to let Dr. Nicki Reaper know. She is not having any other acute symptoms at this time.

## 2018-06-19 NOTE — Telephone Encounter (Signed)
Is there anyway to work her in on Thursday at lunch.  I think you have a meeting.  Can we find out when the meeting starts and when supposed to end?

## 2018-06-19 NOTE — Telephone Encounter (Signed)
Pt is requesting an order for imaging for her back. She said that her back has been hurting her. Pt would like a call back at: [phone]

## 2018-06-19 NOTE — Telephone Encounter (Signed)
Left message for patient to return call to office. 

## 2018-06-20 ENCOUNTER — Telehealth: Payer: Self-pay

## 2018-06-20 NOTE — Telephone Encounter (Signed)
Copied from [place] [id number]. Topic: General - Other >> Jun 20, 2018 11:20 AM Christy Cisneros wrote: Reason for CRM: Pt returned call to office. Pt requests call back. Cb# [phone]   **I have spoken with patient & she is coming in for appointment tomorrow at noon with Dr. Nicki Reaper. Larena Glassman will schedule patient.**

## 2018-06-20 NOTE — Telephone Encounter (Signed)
Have discussed assisted living with pt on multiple occasions.  She has appt with me 06/21/18.  Will discuss again.

## 2018-06-20 NOTE — Telephone Encounter (Signed)
Pt has been scheduled for 12 tomorrow.

## 2018-06-20 NOTE — Telephone Encounter (Signed)
Copied from [place]. Topic: General - Other >> Jun 20, 2018  3:41 PM Janace Aris A wrote: Reason for CRM: Marijo File is a close family friend of the patient, and she feels like the pt should start to think about an assisted living facility being the fact that she has dementia, and many other medical complications.. She would like a call back from Dr.Tullo to get a second opinion on this.

## 2018-06-21 ENCOUNTER — Ambulatory Visit (INDEPENDENT_AMBULATORY_CARE_PROVIDER_SITE_OTHER): Payer: Medicare Other | Admitting: Internal Medicine

## 2018-06-21 ENCOUNTER — Encounter: Payer: Self-pay | Admitting: Internal Medicine

## 2018-06-21 DIAGNOSIS — K2270 Barrett's esophagus without dysplasia: Secondary | ICD-10-CM

## 2018-06-21 DIAGNOSIS — F32A Depression, unspecified: Secondary | ICD-10-CM

## 2018-06-21 DIAGNOSIS — H18232 Secondary corneal edema, left eye: Secondary | ICD-10-CM | POA: Diagnosis not present

## 2018-06-21 DIAGNOSIS — E7800 Pure hypercholesterolemia, unspecified: Secondary | ICD-10-CM

## 2018-06-21 DIAGNOSIS — K2100 Gastro-esophageal reflux disease with esophagitis, without bleeding: Secondary | ICD-10-CM

## 2018-06-21 DIAGNOSIS — H401433 Capsular glaucoma with pseudoexfoliation of lens, bilateral, severe stage: Secondary | ICD-10-CM | POA: Diagnosis not present

## 2018-06-21 DIAGNOSIS — H10232 Serous conjunctivitis, except viral, left eye: Secondary | ICD-10-CM | POA: Diagnosis not present

## 2018-06-21 DIAGNOSIS — F329 Major depressive disorder, single episode, unspecified: Secondary | ICD-10-CM

## 2018-06-21 NOTE — Progress Notes (Signed)
Patient ID: Christy Cisneros, female   DOB: Dec 05, 1919, 84 y.o.   MRN: [id number]   Subjective:    Patient ID: Christy Cisneros, female    DOB: [birth year], 84 y.o.   MRN: [id number]  HPI  Patient here for a scheduled follow up.  She is accompanied by her friend marjory.  History obtained from both of them.  She was worked in for back pain - per note.  Pt denies any back pain.  Stays active.  No chest pain.  Breathing stable.  Some acid reflux.  Discussed the need to take her omeprazole.  Not taking.  Declines any further evaluation.  No abdominal pain.  Eating.  Weight is stable.  Bowels stable.  No urine change.  Some depression.  Discussed with her today.  States she gets lonely.  Likes having someone to talk to.  Declines medication.  Would like to see a Social worker.  Having been trying to see if someone could come to her house.  Has to rely on friends for transportation.  Discussed with her regarding assisted living. She declines.  Social Services evaluated.  Now trying to get [facility] to help with transportation to a counselor.     Past Medical History:  Diagnosis Date  . Barrett's esophagus with esophagitis   . Chronic bronchitis ([place])   . GERD (gastroesophageal reflux disease)   . Glaucoma   . Hypercholesteremia   . Osteoarthritis   . Osteoporosis   . Sleep apnea    has CPAP  . TIA (transient ischemic attack)    Past Surgical History:  Procedure Laterality Date  . BUNIONECTOMY     left  . ROTATOR CUFF REPAIR     right   Family History  Problem Relation Age of Onset  . Breast cancer Sister   . Heart disease Mother    Social History   Socioeconomic History  . Marital status: Single    Spouse name: Not on file  . Number of children: Not on file  . Years of education: Not on file  . Highest education level: Not on file  Occupational History  . Not on file  Social Needs  . Financial resource strain: Not on file  . Food insecurity:    Worry: Not on file    Inability:  Not on file  . Transportation needs:    Medical: Not on file    Non-medical: Not on file  Tobacco Use  . Smoking status: Never Smoker  . Smokeless tobacco: Never Used  Substance and Sexual Activity  . Alcohol use: Yes    Alcohol/week: 0.0 standard drinks    Comment: wine/brandy occasional  . Drug use: No  . Sexual activity: Never  Lifestyle  . Physical activity:    Days per week: 7 days    Minutes per session: 40 min  . Stress: Only a little  Relationships  . Social connections:    Talks on phone: Patient refused    Gets together: Patient refused    Attends religious service: Patient refused    Active member of club or organization: Patient refused    Attends meetings of clubs or organizations: Patient refused    Relationship status: Patient refused  Other Topics Concern  . Not on file  Social History Narrative   Lives alone no kids    Outpatient Encounter Medications as of 06/21/2018  Medication Sig  . dorzolamide-timolol (COSOPT) 22.3-6.8 MG/ML ophthalmic solution   . omeprazole (PRILOSEC) 20 MG capsule TAKE ONE  CAPSULE TWICE A DAY BEFORE MEALS  . prednisoLONE acetate (PRED FORTE) 1 % ophthalmic suspension   . tobramycin (TOBREX) 0.3 % ophthalmic solution   . hydrOXYzine (ATARAX/VISTARIL) 10 MG tablet Take 1 tablet (10 mg total) by mouth 2 (two) times daily as needed. (Patient not taking: Reported on 06/21/2018)  . Hypochlorous Acid 0.012 % SOLN Apply topically.  . nystatin cream (MYCOSTATIN) Apply 1 application topically 2 (two) times daily. (Patient not taking: Reported on 06/21/2018)  . [DISCONTINUED] Clobetasol Prop Emollient Base (CLOBETASOL PROPIONATE E) 0.05 % emollient cream Apply 1 application topically 2 (two) times daily. Not face underarms or groin (Patient not taking: Reported on 06/21/2018)  . [DISCONTINUED] triamcinolone cream (KENALOG) 0.1 % Apply 1 application topically 2 (two) times daily. Prn not to face, axilla, groin (Patient not taking: Reported on  06/21/2018)   No facility-administered encounter medications on file as of 06/21/2018.     Review of Systems  Constitutional: Negative for appetite change and unexpected weight change.  HENT: Negative for congestion and sinus pressure.   Respiratory: Negative for cough, chest tightness and shortness of breath.   Cardiovascular: Negative for chest pain and leg swelling.  Gastrointestinal: Negative for abdominal pain, diarrhea, nausea and vomiting.  Genitourinary: Negative for difficulty urinating and dysuria.  Musculoskeletal: Negative for joint swelling and myalgias.  Skin: Negative for color change and rash.  Neurological: Negative for dizziness, light-headedness and headaches.  Psychiatric/Behavioral: Negative for agitation and dysphoric mood.       Some depression as outlined.         Objective:    Physical Exam  Constitutional: She appears well-developed and well-nourished. No distress.  HENT:  Nose: Nose normal.  Mouth/Throat: Oropharynx is clear and moist.  Neck: Neck supple. No thyromegaly present.  Cardiovascular: Normal rate and regular rhythm.  Pulmonary/Chest: Breath sounds normal. No respiratory distress. She has no wheezes.  Abdominal: Soft. Bowel sounds are normal. There is no tenderness.  Musculoskeletal: She exhibits no edema or tenderness.  Lymphadenopathy:    She has no cervical adenopathy.  Skin: No rash noted. No erythema.  Psychiatric: She has a normal mood and affect. Her behavior is normal.    BP 122/70 (BP Location: Left Arm, Patient Position: Sitting, Cuff Size: Normal)   Pulse 88   Temp (!) 97.4 F (36.3 C) (Oral)   Resp 15   Wt 102 lb 8 oz (46.5 kg)   SpO2 96%   BMI 18.75 kg/m  Wt Readings from Last 3 Encounters:  06/21/18 102 lb 8 oz (46.5 kg)  05/02/18 102 lb 3.2 oz (46.4 kg)  02/26/18 98 lb 9.6 oz (44.7 kg)     Lab Results  Component Value Date   WBC 8.3 11/13/2017   HGB 12.8 11/13/2017   HCT 38.7 11/13/2017   PLT 348.0  11/13/2017   GLUCOSE 97 11/02/2017   CHOL 207 (H) 03/17/2017   TRIG 93 03/17/2017   HDL 72 03/17/2017   LDLCALC 116 (H) 03/17/2017   ALT 15 11/02/2017   AST 19 11/02/2017   NA 139 11/02/2017   K 3.5 11/02/2017   CL 104 11/02/2017   CREATININE 1.00 11/02/2017   BUN 26 (H) 11/02/2017   CO2 27 11/02/2017   TSH 1.47 08/30/2017   INR 0.96 03/16/2017   HGBA1C 5.4 03/17/2017       Assessment & Plan:   Problem List Items Addressed This Visit    BARRETTS ESOPHAGUS    Restart omeprazole.  Follow.  Declines further evaluation  or f/u.        Depression    Concern regarding depression as outlined.  Declines medication.  Agreed to counseling.  Trying to arrange for transportation.  Have consulted [facility] for help with transportation to counselor.        GERD (gastroesophageal reflux disease)    Start omeprazole.  Needs to take regularly.        HYPERCHOLESTEROLEMIA    Low cholesterol diet and exercise.  Follow lipid panel.            Einar Pheasant, MD

## 2018-06-24 ENCOUNTER — Encounter: Payer: Self-pay | Admitting: Internal Medicine

## 2018-06-24 NOTE — Assessment & Plan Note (Signed)
Low cholesterol diet and exercise.  Follow lipid panel.   

## 2018-06-24 NOTE — Assessment & Plan Note (Signed)
Restart omeprazole.  Follow.  Declines further evaluation or f/u.

## 2018-06-24 NOTE — Assessment & Plan Note (Signed)
Start omeprazole.  Needs to take regularly.

## 2018-06-24 NOTE — Assessment & Plan Note (Signed)
Concern regarding depression as outlined.  Declines medication.  Agreed to counseling.  Trying to arrange for transportation.  Have consulted [facility] for help with transportation to counselor.

## 2018-06-27 DIAGNOSIS — H903 Sensorineural hearing loss, bilateral: Secondary | ICD-10-CM | POA: Diagnosis not present

## 2018-06-27 DIAGNOSIS — H6123 Impacted cerumen, bilateral: Secondary | ICD-10-CM | POA: Diagnosis not present

## 2018-07-03 ENCOUNTER — Other Ambulatory Visit: Payer: Self-pay

## 2018-07-03 NOTE — Patient Outreach (Signed)
[facility]) Care Management  07/03/2018  [name] 01/02/1920 [id number]  TELEPHONE SCREENING Referral date: 06/19/18 Referral source: primary MD  Referral reason: needs counseling assistance Insurance: medicare Attempt #1  Telephone call to patient regarding. Primary MD referral. Unable to reach patient or leave voice message.  Message states voice mail box is not set up.    PLAN: RNCM will attempt 2nd telephone call to patient within 4 business days.  RNCm will send patient outreach letter to attempt contact  Quinn Plowman RN,BSN,CCM [facility] Telephonic  [phone]

## 2018-07-04 DIAGNOSIS — M810 Age-related osteoporosis without current pathological fracture: Secondary | ICD-10-CM | POA: Diagnosis not present

## 2018-07-05 ENCOUNTER — Ambulatory Visit: Payer: Medicare Other

## 2018-07-06 ENCOUNTER — Other Ambulatory Visit: Payer: Self-pay

## 2018-07-06 NOTE — Patient Outreach (Signed)
[facility]) Care Management  07/06/2018  Christy Cisneros [birth year] [id number]  TELEPHONE SCREENING Referral date: 06/19/18 Referral source: primary MD  Referral reason: needs counseling assistance Insurance: medicare  Telephone call to patient regarding primary MD referral. HIPAA verified with patient. Explained reason for call. Patient states she was saw a [facility] care management brochure and spoke with her doctor about seeing a counselor.  Patient states she has been dealing with being alone. She states her family lives in [place].  She states she was never married and never had children.  Patient states she has spoken with her family and has asked to live with them but she states she is not ready for that yet. Patient states this can make her feel a little depressed at times RNCM discussed and offered [facility] care management services.  Patient verbally agreed to follow up with [facility] social worker.  PLAN: RNCM will refer patient to  Education officer, museum.   Quinn Plowman RN,BSN,CCM [facility] Telephonic  [phone]

## 2018-07-10 ENCOUNTER — Ambulatory Visit (INDEPENDENT_AMBULATORY_CARE_PROVIDER_SITE_OTHER): Payer: Medicare Other

## 2018-07-10 ENCOUNTER — Ambulatory Visit (INDEPENDENT_AMBULATORY_CARE_PROVIDER_SITE_OTHER): Payer: Medicare Other | Admitting: Family Medicine

## 2018-07-10 ENCOUNTER — Encounter: Payer: Self-pay | Admitting: Family Medicine

## 2018-07-10 VITALS — BP 126/74 | HR 88 | Temp 97.6°F | Wt 97.6 lb

## 2018-07-10 DIAGNOSIS — M25511 Pain in right shoulder: Secondary | ICD-10-CM

## 2018-07-10 DIAGNOSIS — M19011 Primary osteoarthritis, right shoulder: Secondary | ICD-10-CM | POA: Diagnosis not present

## 2018-07-10 MED ORDER — ACETAMINOPHEN 325 MG PO TABS: 650.0000 mg | ORAL_TABLET | Freq: Three times a day (TID) | ORAL | 1 refills | Status: DC | PRN

## 2018-07-10 NOTE — Progress Notes (Signed)
Subjective:    Patient ID: Christy Cisneros, female    DOB: 04-15-1920, 84 y.o.   MRN: [id number]  HPI  Presents to clinic c/o right shoulder pain for the past week. States it does seem better today. Denies any injury to the arm/shoulder. Denies lifting any thing.  States her right hand is her dominant hand, so she often does use her right arm.  Patient has not tried anything for pain at home, states she does not even have Tylenol at her house.  Denies any numbness or tingling in extremities.  Denies any chest pain/palpitations.  Denies any extremity swelling.  Denies any shortness of breath or wheezing.   Patient Active Problem List   Diagnosis Date Noted  . Depression 05/06/2018  . Abrasion 02/28/2018  . History of CVA (cerebrovascular accident) 03/16/2017  . Memory change 09/04/2016  . Pneumonia 07/17/2016  . Mild cognitive impairment 05/15/2016  . Dizziness 04/21/2016  . Neck pain 02/11/2016  . Muscle cramps 11/14/2015  . Urine incontinence 08/16/2015  . Hematoma 08/16/2015  . Weakness 07/12/2015  . Headache 06/16/2015  . Unsteady gait 06/01/2015  . GERD (gastroesophageal reflux disease) 08/11/2014  . DYSPNEA 04/17/2009  . HYPERCHOLESTEROLEMIA 04/16/2009  . Obstructive sleep apnea 04/16/2009  . Glaucoma 04/16/2009  . Transient cerebral ischemia 04/16/2009  . BARRETTS ESOPHAGUS 04/16/2009  . OSTEOARTHRITIS 04/16/2009  . Osteoporosis 04/16/2009   Social History   Tobacco Use  . Smoking status: Never Smoker  . Smokeless tobacco: Never Used  Substance Use Topics  . Alcohol use: Yes    Alcohol/week: 0.0 standard drinks    Comment: wine/brandy occasional   Review of Systems  Constitutional: Negative for chills, fatigue and fever.  HENT: Negative for congestion, ear pain, sinus pain and sore throat.   Eyes: Negative.   Respiratory: Negative for cough, shortness of breath and wheezing.   Cardiovascular: Negative for chest pain, palpitations and leg swelling.    Gastrointestinal: Negative for abdominal pain, diarrhea, nausea and vomiting.  Genitourinary: Negative for dysuria, frequency and urgency.  Musculoskeletal: +right shoulder pain Skin: Negative for color change, pallor and rash.  Neurological: Negative for syncope, light-headedness and headaches.  Psychiatric/Behavioral: The patient is not nervous/anxious.       Objective:   Physical Exam  Constitutional: She is oriented to person, place, and time. She appears well-nourished. No distress.  HENT:  Head: Normocephalic and atraumatic.  Eyes: Conjunctivae and EOM are normal. No scleral icterus.  Neck: Neck supple. No JVD present.  Cardiovascular: Normal rate and regular rhythm.  Pulmonary/Chest: Effort normal and breath sounds normal. No respiratory distress.  Musculoskeletal: Normal range of motion. She exhibits no edema, tenderness or deformity.  ROM shoulders intact. No pain with ROM in clinic. Grips equal and strong. Can hold both arms out in front of her and resist me pushing down on arms.   Neurological: She is alert and oriented to person, place, and time.  Skin: Skin is warm and dry. No rash noted. No pallor.  Psychiatric: She has a normal mood and affect. Her behavior is normal.  Nursing note and vitals reviewed.   Vitals:   07/10/18 1018  BP: 126/74  Pulse: 88  Temp: 97.6 F (36.4 C)  SpO2: 97%      Assessment & Plan:   Right shoulder pain-we will get x-ray of right shoulder in clinic today.  Pain seems to have resolved on its own.  Patient prescribed Tylenol to use if needed in future for joint pains, patient  advised that if she cannot get this through her insurance that she can buy Tylenol over-the-counter.  Discussed doing stretching and range of motion exercises with arms and legs every day to keep joints from becoming stiff.  Patient will keep regularly scheduled follow-up with PCP as planned.  Advised to return to clinic sooner if any issues arise.

## 2018-07-11 ENCOUNTER — Other Ambulatory Visit: Payer: Self-pay | Admitting: *Deleted

## 2018-07-11 NOTE — Patient Outreach (Signed)
[facility]) Care Management  07/11/2018  Christy Cisneros 06-Aug-1919 [id number]    Patient referred to this social worker by telephonic case manager to contact patient regarding feelings of depression. Patient's admits to depressed mood however denies thoughts of of self harm.  Per patient, she just misses her family stating that she is the only one left out of 7 siblings. Patient resides in her own home, 1st floor condo, however states that due to increasing expenses and need to be around family she would like to move. Per patient, she has contacted her niece yesterday who lives in [place], [place] and asked if she could come live with her but she has not been given a response. She also has family in [place] but does not want to move there.Patient plans to sell her condo to pay for the moving expenses.  Per patient, she does have friends that come check on her. She has a friend that comes every Sunday to take her grocery shopping and help around the house.  She also has someone who takes her to do Laundry. Patient also has someone to help clean approximately 1x per month.  Despite the fact that patient has no family, she does have a few friends that she has met over the years who check on her regularly. Per patient, they recently went to out to lunch together.  Patient's depression explored as well as ways to cope including continuing to do the things that she enjoys. Patient states that she used to volunteer a lot but now that she has gotten older and stopped driving she does not volunteer as much. 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 Patient was very friendly and engaging and seemed to understand the importance of staying active in whatever capacity and continuing to make plans to avoid isolation.    Plan: Patient agrees to [place] visit scheduled for 12  /13/19.

## 2018-07-13 ENCOUNTER — Other Ambulatory Visit: Payer: Self-pay | Admitting: *Deleted

## 2018-07-13 ENCOUNTER — Encounter: Payer: Self-pay | Admitting: *Deleted

## 2018-07-13 NOTE — Patient Outreach (Signed)
[facility]) Care Management  [facility] Social Work  07/13/2018  Christy Cisneros 05/24/1920 [id number]  Subjective:  Patient is a 84 year old female referred to this social worker to address symptoms of depression. Per patient, she is from a family of 54 and now she is the only sibling left. Per patient, she has no family locally which causes her sadness. However she has family in [place] and [place] of whom she has been in contact with and has asked her nephew if she can come life with him and his family. Per patient, she feels that a change of scenery may help. "I would like to be with family". Per patient, she reports feeling much better because she has something to look forward to. Patient states that she has a few good friends who transport her to medical appointments, lunches, and other events. Per patient, she plans to go to a ballet next week. Patient reports having meals on wheels and a housekeeper that comes when she calls. Patient also has a friend that comes every Sunday to take her to the grocery store. Patient's denies thoughts of self harm, stating that she has had a good life and that she will continue to have a good life.   Objective:   Encounter Medications:  Outpatient Encounter Medications as of 07/13/2018  Medication Sig  . acetaminophen (TYLENOL) 325 MG tablet Take 2 tablets (650 mg total) by mouth every 8 (eight) hours as needed for mild pain.  . dorzolamide-timolol (COSOPT) 22.3-6.8 MG/ML ophthalmic solution   . hydrOXYzine (ATARAX/VISTARIL) 10 MG tablet Take 1 tablet (10 mg total) by mouth 2 (two) times daily as needed.  . Hypochlorous Acid 0.012 % SOLN Apply topically.  . nystatin cream (MYCOSTATIN) Apply 1 application topically 2 (two) times daily.  Marland Kitchen omeprazole (PRILOSEC) 20 MG capsule TAKE ONE CAPSULE TWICE A DAY BEFORE MEALS  . prednisoLONE acetate (PRED FORTE) 1 % ophthalmic suspension   . tobramycin (TOBREX) 0.3 % ophthalmic solution     No facility-administered encounter medications on file as of 07/13/2018.     Functional Status:  In your present state of health, do you have any difficulty performing the following activities: 07/13/2018  Hearing? Y  Vision? Y  Difficulty concentrating or making decisions? Y  Walking or climbing stairs? Y  Dressing or bathing? N  Doing errands, shopping? Y  Preparing Food and eating ? Y  Comment receives meals on wheels  Using the Toilet? N  In the past six months, have you accidently leaked urine? N  Do you have problems with loss of bowel control? N  Managing your Medications? N  Managing your Finances? N  Housekeeping or managing your Housekeeping? Y  Comment has a housekeeper   Some recent data might be hidden    Fall/Depression Screening:  PHQ 2/9 Scores 07/13/2018 07/06/2018 11/08/2017 07/04/2017 04/11/2017 05/13/2016 04/21/2016  PHQ - 2 Score 0 0 0 0 0 0 0  PHQ- 9 Score - - - 2 0 - -    Assessment: Pleasant visit with patient. Patient very friendly and engaging. Tended to ask repetitive questions throughout the visit. Patient openly discussed her sadness being without any local family, however her sadness seems to be an appropriate response to her situation. Despite patient's sadness, she continues to be as active as possible attending social events with friend's. Patient continues to plan and look forward to the future and possbily moving with family in [place]. Patient allowed to vent,  supportive counseling provided. Patient declined any further intervention from this Education officer, museum, however patient provided with this social worker's contact information to call if needed in the future.   Plan: Patient assessed, no further social work intervention needed.   Sheralyn Boatman [facility] Care Management [phone]

## 2018-07-16 DIAGNOSIS — H353132 Nonexudative age-related macular degeneration, bilateral, intermediate dry stage: Secondary | ICD-10-CM | POA: Diagnosis not present

## 2018-07-16 DIAGNOSIS — H18232 Secondary corneal edema, left eye: Secondary | ICD-10-CM | POA: Diagnosis not present

## 2018-07-16 DIAGNOSIS — H10232 Serous conjunctivitis, except viral, left eye: Secondary | ICD-10-CM | POA: Diagnosis not present

## 2018-07-16 DIAGNOSIS — H401433 Capsular glaucoma with pseudoexfoliation of lens, bilateral, severe stage: Secondary | ICD-10-CM | POA: Diagnosis not present

## 2018-08-06 DIAGNOSIS — H10232 Serous conjunctivitis, except viral, left eye: Secondary | ICD-10-CM | POA: Diagnosis not present

## 2018-08-06 DIAGNOSIS — H18232 Secondary corneal edema, left eye: Secondary | ICD-10-CM | POA: Diagnosis not present

## 2018-08-06 DIAGNOSIS — H401433 Capsular glaucoma with pseudoexfoliation of lens, bilateral, severe stage: Secondary | ICD-10-CM | POA: Diagnosis not present

## 2018-08-06 DIAGNOSIS — H353132 Nonexudative age-related macular degeneration, bilateral, intermediate dry stage: Secondary | ICD-10-CM | POA: Diagnosis not present

## 2018-08-22 ENCOUNTER — Encounter: Payer: Self-pay | Admitting: Internal Medicine

## 2018-08-22 ENCOUNTER — Ambulatory Visit (INDEPENDENT_AMBULATORY_CARE_PROVIDER_SITE_OTHER): Payer: Medicare Other | Admitting: Internal Medicine

## 2018-08-22 DIAGNOSIS — E7800 Pure hypercholesterolemia, unspecified: Secondary | ICD-10-CM | POA: Diagnosis not present

## 2018-08-22 DIAGNOSIS — K2100 Gastro-esophageal reflux disease with esophagitis, without bleeding: Secondary | ICD-10-CM

## 2018-08-22 DIAGNOSIS — G3184 Mild cognitive impairment, so stated: Secondary | ICD-10-CM

## 2018-08-22 DIAGNOSIS — K2270 Barrett's esophagus without dysplasia: Secondary | ICD-10-CM | POA: Diagnosis not present

## 2018-08-22 MED ORDER — NYSTATIN 100000 UNIT/GM EX CREA: 1.0000 "application " | TOPICAL_CREAM | Freq: Two times a day (BID) | CUTANEOUS | 0 refills | Status: DC

## 2018-08-22 NOTE — Assessment & Plan Note (Signed)
Omeprazole

## 2018-08-22 NOTE — Assessment & Plan Note (Signed)
Previously saw neurology.  Discussed her living situation.  Discussed sitters vs assisted living.  Discussed the need to be able to have help and be monitored.  She has friends who do her laundry, clean and help her get groceries.  Will think about options.

## 2018-08-22 NOTE — Assessment & Plan Note (Signed)
Follow lipid panel.   

## 2018-08-22 NOTE — Progress Notes (Signed)
Patient ID: Christy Cisneros, female   DOB: 01/08/1920, 85 y.o.   MRN: [id number]   Subjective:    Patient ID: Christy Cisneros, female    DOB: 14-Jul-1920, 85 y.o.   MRN: [id number]  HPI  Patient here for a scheduled follow up.  She is accompanied by her friend Pamala Hurry.  History obtained from both of them.  Apparently had been contemplating moving to [place] to live with her niece.  Decided this week not to go.  Discussed assisted living facilities.  They have taken her to look at various places.  Discussed with her today.  Discussed sitters vs assisted living.  Wants to stay at her home.  She is eating.  Gets meals on wheels.  Has friends who do her laundry, clean and help her get groceries.  Social services has been out to evaluate her living situation.  Felt things were stable.  No chest pain.  No acid reflux reported.  No abdominal pain.  Bowels moving.     Past Medical History:  Diagnosis Date  . Barrett's esophagus with esophagitis   . Chronic bronchitis ([place])   . Depression   . GERD (gastroesophageal reflux disease)   . Glaucoma   . Hypercholesteremia   . Osteoarthritis   . Osteoporosis   . Sleep apnea    has CPAP  . TIA (transient ischemic attack)    Past Surgical History:  Procedure Laterality Date  . BUNIONECTOMY     left  . ROTATOR CUFF REPAIR     right   Family History  Problem Relation Age of Onset  . Breast cancer Sister   . Heart disease Mother   . Alcohol abuse Father    Social History   Socioeconomic History  . Marital status: Single    Spouse name: Not on file  . Number of children: Not on file  . Years of education: Not on file  . Highest education level: Not on file  Occupational History  . Not on file  Social Needs  . Financial resource strain: Not hard at all  . Food insecurity:    Worry: Never true    Inability: Never true  . Transportation needs:    Medical: No    Non-medical: No  Tobacco Use  . Smoking status: Never Smoker  .  Smokeless tobacco: Never Used  Substance and Sexual Activity  . Alcohol use: Yes    Alcohol/week: 0.0 standard drinks    Comment: wine/brandy occasional  . Drug use: No  . Sexual activity: Never  Lifestyle  . Physical activity:    Days per week: 7 days    Minutes per session: 40 min  . Stress: Only a little  Relationships  . Social connections:    Talks on phone: Three times a week    Gets together: Three times a week    Attends religious service: Never    Active member of club or organization: No    Attends meetings of clubs or organizations: Never    Relationship status: Never married  Other Topics Concern  . Not on file  Social History Narrative   Lives alone no kids    Outpatient Encounter Medications as of 08/22/2018  Medication Sig  . acetaminophen (TYLENOL) 325 MG tablet Take 2 tablets (650 mg total) by mouth every 8 (eight) hours as needed for mild pain.  . carboxymethylcellul-glycerin (OPTIVE) 0.5-0.9 % ophthalmic solution Apply to eye.  . dorzolamide-timolol (COSOPT) 22.3-6.8 MG/ML ophthalmic solution   .  hydrOXYzine (ATARAX/VISTARIL) 10 MG tablet Take 1 tablet (10 mg total) by mouth 2 (two) times daily as needed.  . Hypochlorous Acid 0.012 % SOLN Apply topically.  . nystatin cream (MYCOSTATIN) Apply 1 application topically 2 (two) times daily. Under the right breast  . omeprazole (PRILOSEC) 20 MG capsule TAKE ONE CAPSULE TWICE A DAY BEFORE MEALS  . prednisoLONE acetate (PRED FORTE) 1 % ophthalmic suspension   . tobramycin (TOBREX) 0.3 % ophthalmic solution   . [DISCONTINUED] nystatin cream (MYCOSTATIN) Apply 1 application topically 2 (two) times daily.   No facility-administered encounter medications on file as of 08/22/2018.     Review of Systems  Constitutional: Negative for appetite change and unexpected weight change.  HENT: Negative for congestion and sinus pressure.   Respiratory: Negative for cough, chest tightness and shortness of breath.     Cardiovascular: Negative for chest pain, palpitations and leg swelling.  Gastrointestinal: Negative for abdominal pain, diarrhea, nausea and vomiting.  Genitourinary: Negative for difficulty urinating and dysuria.  Musculoskeletal: Negative for joint swelling and myalgias.  Skin: Negative for color change and rash.  Neurological: Negative for dizziness, light-headedness and headaches.  Psychiatric/Behavioral: Negative for agitation and dysphoric mood.       Objective:    Physical Exam Constitutional:      General: She is not in acute distress.    Appearance: Normal appearance.  HENT:     Nose: Nose normal. No congestion.     Mouth/Throat:     Pharynx: No oropharyngeal exudate or posterior oropharyngeal erythema.  Neck:     Musculoskeletal: Neck supple. No muscular tenderness.     Thyroid: No thyromegaly.  Cardiovascular:     Rate and Rhythm: Normal rate and regular rhythm.  Pulmonary:     Effort: No respiratory distress.     Breath sounds: Normal breath sounds. No wheezing.  Abdominal:     General: Bowel sounds are normal.     Palpations: Abdomen is soft.     Tenderness: There is no abdominal tenderness.  Musculoskeletal:        General: No swelling or tenderness.  Lymphadenopathy:     Cervical: No cervical adenopathy.  Skin:    Findings: No erythema or rash.  Neurological:     Mental Status: She is alert.  Psychiatric:        Mood and Affect: Mood normal.        Behavior: Behavior normal.     BP 112/64 (BP Location: Left Arm, Patient Position: Sitting, Cuff Size: Normal)   Pulse 94   Temp 97.6 F (36.4 C) (Oral)   Resp 16   Wt 100 lb 9.6 oz (45.6 kg)   SpO2 95%   BMI 18.40 kg/m  Wt Readings from Last 3 Encounters:  08/22/18 100 lb 9.6 oz (45.6 kg)  07/10/18 97 lb 9.6 oz (44.3 kg)  06/21/18 102 lb 8 oz (46.5 kg)     Lab Results  Component Value Date   WBC 8.3 11/13/2017   HGB 12.8 11/13/2017   HCT 38.7 11/13/2017   PLT 348.0 11/13/2017   GLUCOSE  97 11/02/2017   CHOL 207 (H) 03/17/2017   TRIG 93 03/17/2017   HDL 72 03/17/2017   LDLCALC 116 (H) 03/17/2017   ALT 15 11/02/2017   AST 19 11/02/2017   NA 139 11/02/2017   K 3.5 11/02/2017   CL 104 11/02/2017   CREATININE 1.00 11/02/2017   BUN 26 (H) 11/02/2017   CO2 27 11/02/2017   TSH 1.47  08/30/2017   INR 0.96 03/16/2017   HGBA1C 5.4 03/17/2017       Assessment & Plan:   Problem List Items Addressed This Visit    BARRETTS ESOPHAGUS    Has previously declined further evaluation.  On omeprazole.        GERD (gastroesophageal reflux disease)    Omeprazole.        HYPERCHOLESTEROLEMIA    Follow lipid panel.        Mild cognitive impairment    Previously saw neurology.  Discussed her living situation.  Discussed sitters vs assisted living.  Discussed the need to be able to have help and be monitored.  She has friends who do her laundry, clean and help her get groceries.  Will think about options.            Einar Pheasant, MD

## 2018-08-22 NOTE — Assessment & Plan Note (Signed)
Has previously declined further evaluation.  On omeprazole.

## 2018-08-31 DIAGNOSIS — H401433 Capsular glaucoma with pseudoexfoliation of lens, bilateral, severe stage: Secondary | ICD-10-CM | POA: Diagnosis not present

## 2018-08-31 DIAGNOSIS — H10232 Serous conjunctivitis, except viral, left eye: Secondary | ICD-10-CM | POA: Diagnosis not present

## 2018-08-31 DIAGNOSIS — H18232 Secondary corneal edema, left eye: Secondary | ICD-10-CM | POA: Diagnosis not present

## 2018-08-31 DIAGNOSIS — H353132 Nonexudative age-related macular degeneration, bilateral, intermediate dry stage: Secondary | ICD-10-CM | POA: Diagnosis not present

## 2018-09-04 DIAGNOSIS — Z08 Encounter for follow-up examination after completed treatment for malignant neoplasm: Secondary | ICD-10-CM | POA: Diagnosis not present

## 2018-09-04 DIAGNOSIS — L304 Erythema intertrigo: Secondary | ICD-10-CM | POA: Diagnosis not present

## 2018-09-04 DIAGNOSIS — Z85828 Personal history of other malignant neoplasm of skin: Secondary | ICD-10-CM | POA: Diagnosis not present

## 2018-09-04 DIAGNOSIS — Z872 Personal history of diseases of the skin and subcutaneous tissue: Secondary | ICD-10-CM | POA: Diagnosis not present

## 2018-09-04 DIAGNOSIS — L821 Other seborrheic keratosis: Secondary | ICD-10-CM | POA: Diagnosis not present

## 2018-09-16 DIAGNOSIS — N390 Urinary tract infection, site not specified: Secondary | ICD-10-CM | POA: Diagnosis not present

## 2018-09-16 DIAGNOSIS — R300 Dysuria: Secondary | ICD-10-CM | POA: Diagnosis not present

## 2018-09-24 DIAGNOSIS — H353132 Nonexudative age-related macular degeneration, bilateral, intermediate dry stage: Secondary | ICD-10-CM | POA: Diagnosis not present

## 2018-09-24 DIAGNOSIS — H18232 Secondary corneal edema, left eye: Secondary | ICD-10-CM | POA: Diagnosis not present

## 2018-09-24 DIAGNOSIS — H10232 Serous conjunctivitis, except viral, left eye: Secondary | ICD-10-CM | POA: Diagnosis not present

## 2018-09-24 DIAGNOSIS — H401433 Capsular glaucoma with pseudoexfoliation of lens, bilateral, severe stage: Secondary | ICD-10-CM | POA: Diagnosis not present

## 2018-10-04 DIAGNOSIS — H6123 Impacted cerumen, bilateral: Secondary | ICD-10-CM | POA: Diagnosis not present

## 2018-10-04 DIAGNOSIS — H903 Sensorineural hearing loss, bilateral: Secondary | ICD-10-CM | POA: Diagnosis not present

## 2018-10-12 DIAGNOSIS — H353132 Nonexudative age-related macular degeneration, bilateral, intermediate dry stage: Secondary | ICD-10-CM | POA: Diagnosis not present

## 2018-10-12 DIAGNOSIS — H18232 Secondary corneal edema, left eye: Secondary | ICD-10-CM | POA: Diagnosis not present

## 2018-10-12 DIAGNOSIS — H10232 Serous conjunctivitis, except viral, left eye: Secondary | ICD-10-CM | POA: Diagnosis not present

## 2018-10-12 DIAGNOSIS — H401433 Capsular glaucoma with pseudoexfoliation of lens, bilateral, severe stage: Secondary | ICD-10-CM | POA: Diagnosis not present

## 2018-10-16 DIAGNOSIS — H353132 Nonexudative age-related macular degeneration, bilateral, intermediate dry stage: Secondary | ICD-10-CM | POA: Diagnosis not present

## 2018-10-16 DIAGNOSIS — H401433 Capsular glaucoma with pseudoexfoliation of lens, bilateral, severe stage: Secondary | ICD-10-CM | POA: Diagnosis not present

## 2018-10-16 DIAGNOSIS — H10232 Serous conjunctivitis, except viral, left eye: Secondary | ICD-10-CM | POA: Diagnosis not present

## 2018-10-16 DIAGNOSIS — H18239 Secondary corneal edema, unspecified eye: Secondary | ICD-10-CM | POA: Diagnosis not present

## 2018-11-06 DIAGNOSIS — H401433 Capsular glaucoma with pseudoexfoliation of lens, bilateral, severe stage: Secondary | ICD-10-CM | POA: Diagnosis not present

## 2018-11-06 DIAGNOSIS — H353132 Nonexudative age-related macular degeneration, bilateral, intermediate dry stage: Secondary | ICD-10-CM | POA: Diagnosis not present

## 2018-11-06 DIAGNOSIS — H10232 Serous conjunctivitis, except viral, left eye: Secondary | ICD-10-CM | POA: Diagnosis not present

## 2018-11-06 DIAGNOSIS — H18232 Secondary corneal edema, left eye: Secondary | ICD-10-CM | POA: Diagnosis not present

## 2018-11-14 ENCOUNTER — Ambulatory Visit (INDEPENDENT_AMBULATORY_CARE_PROVIDER_SITE_OTHER): Payer: Medicare Other | Admitting: Internal Medicine

## 2018-11-14 ENCOUNTER — Encounter: Payer: Self-pay | Admitting: Internal Medicine

## 2018-11-14 DIAGNOSIS — G3184 Mild cognitive impairment, so stated: Secondary | ICD-10-CM

## 2018-11-14 DIAGNOSIS — E7800 Pure hypercholesterolemia, unspecified: Secondary | ICD-10-CM

## 2018-11-14 DIAGNOSIS — K2100 Gastro-esophageal reflux disease with esophagitis, without bleeding: Secondary | ICD-10-CM

## 2018-11-14 DIAGNOSIS — H409 Unspecified glaucoma: Secondary | ICD-10-CM

## 2018-11-14 DIAGNOSIS — K2270 Barrett's esophagus without dysplasia: Secondary | ICD-10-CM

## 2018-11-14 DIAGNOSIS — F329 Major depressive disorder, single episode, unspecified: Secondary | ICD-10-CM

## 2018-11-14 DIAGNOSIS — F32A Depression, unspecified: Secondary | ICD-10-CM

## 2018-11-14 NOTE — Progress Notes (Addendum)
Patient ID: Christy Cisneros, female   DOB: Dec 03, 1919, 85 y.o.   MRN: [id number] Virtual Visit via Telophone:  Note  This visit type was conducted due to national recommendations for restrictions regarding the COVID-19 pandemic (e.g. social distancing).  This format is felt to be most appropriate for this patient at this time.  All issues noted in this document were discussed and addressed.  No physical exam was performed (except for noted visual exam findings with Video Visits).   I connected with [name] on 11/14/18 at 11:30 AM EDTby telephone and verified that I am speaking with the correct person using two identifiers. Location patient: home Location provider: work Persons participating in the virtual visit: patient, provider and patients friend Gwenette Greet).   I discussed the limitations, risks, security and privacy concerns of performing an evaluation and management service by telephone and the availability of in person appointments.  The patient expressed understanding and agreed to proceed.   Reason for visit: scheduled follow up.   HPI: States she is doing relatively well.  Trying to stay in.  No known COVID exposures.  No fever.  No chest congestion or sob.  No significant allergies.  No chest pain.  No acid reflux.  On prilosec.  States this controls her symptoms.  No abdominal pain.  Bowels moving.  No urine change.  Still has friends who check on her.  Feels she is doing ok at home.  Declines assisted living.  She declines.  Discussed depression.  Has been evaluated by home evaluation.  Discussed with her again today regarding referral to psychiatry/counseling.  She wants to think about this and will notify me if desires referral.  She reports no swelling in her legs.  Some bruising.  Discussed with her today.  States she sees dermatology and has f/u coming up soon.  No acute lesions.     ROS: See pertinent positives and negatives per HPI.  Past Medical History:  Diagnosis  Date  . Barrett's esophagus with esophagitis   . Chronic bronchitis ([place])   . Depression   . GERD (gastroesophageal reflux disease)   . Glaucoma   . Hypercholesteremia   . Osteoarthritis   . Osteoporosis   . Sleep apnea    has CPAP  . TIA (transient ischemic attack)     Past Surgical History:  Procedure Laterality Date  . BUNIONECTOMY     left  . ROTATOR CUFF REPAIR     right    Family History  Problem Relation Age of Onset  . Breast cancer Sister   . Heart disease Mother   . Alcohol abuse Father     SOCIAL HX: reviewed.    Current Outpatient Medications:  .  acetaminophen (TYLENOL) 325 MG tablet, Take 2 tablets (650 mg total) by mouth every 8 (eight) hours as needed for mild pain., Disp: 100 tablet, Rfl: 1 .  carboxymethylcellul-glycerin (OPTIVE) 0.5-0.9 % ophthalmic solution, Apply to eye., Disp: , Rfl:  .  dorzolamide-timolol (COSOPT) 22.3-6.8 MG/ML ophthalmic solution, , Disp: , Rfl:  .  hydrOXYzine (ATARAX/VISTARIL) 10 MG tablet, Take 1 tablet (10 mg total) by mouth 2 (two) times daily as needed., Disp: 60 tablet, Rfl: 1 .  Hypochlorous Acid 0.012 % SOLN, Apply topically., Disp: , Rfl:  .  nystatin cream (MYCOSTATIN), Apply 1 application topically 2 (two) times daily. Under the right breast, Disp: 30 g, Rfl: 0 .  omeprazole (PRILOSEC) 20 MG capsule, TAKE ONE CAPSULE TWICE A DAY BEFORE MEALS, Disp: 120  capsule, Rfl: 1 .  prednisoLONE acetate (PRED FORTE) 1 % ophthalmic suspension, , Disp: , Rfl:  .  tobramycin (TOBREX) 0.3 % ophthalmic solution, , Disp: , Rfl:   EXAM:  GENERAL: alert.  Answering questions.  Sounds to be in no acute distress.    PSYCH/NEURO: pleasant and cooperative, no obvious depression or anxiety, speech and thought processing grossly intact  ASSESSMENT AND PLAN:  Discussed the following assessment and plan:  Barrett's esophagus without dysplasia  Depression, unspecified depression type  Gastroesophageal reflux disease with  esophagitis  Glaucoma, unspecified glaucoma type, unspecified laterality  HYPERCHOLESTEROLEMIA  Mild cognitive impairment  BARRETTS ESOPHAGUS On omeprazole.  Symptoms controlled.  Has declined further w/up and evaluation.  Follow.    Depression Discussed with her today.  She will think about referral to psychiatry/counseling.  Will notify me if agreeable.    GERD (gastroesophageal reflux disease) Controlled on omeprazole.    Glaucoma Followed by ophthalmology.    HYPERCHOLESTEROLEMIA Follow lipid panel.    Mild cognitive impairment Previously saw neurology.  Have discussed assisted living. She declines.  Has friends checking on her, doing her laundry and getting groceries.  Follow.      I discussed the assessment and treatment plan with the patient. The patient was provided an opportunity to ask questions and all were answered. The patient agreed with the plan and demonstrated an understanding of the instructions.   The patient was advised to call back or seek an in-person evaluation if the symptoms worsen or if the condition fails to improve as anticipated.  I provided 25 minutes of non-face-to-face time during this encounter.   Einar Pheasant, MD

## 2018-11-17 ENCOUNTER — Encounter: Payer: Self-pay | Admitting: Internal Medicine

## 2018-11-17 NOTE — Assessment & Plan Note (Signed)
On omeprazole.  Symptoms controlled.  Has declined further w/up and evaluation.  Follow.

## 2018-11-17 NOTE — Assessment & Plan Note (Signed)
Follow lipid panel.   

## 2018-11-17 NOTE — Assessment & Plan Note (Signed)
Followed by ophthalmology.   

## 2018-11-17 NOTE — Assessment & Plan Note (Signed)
Controlled on omeprazole.   

## 2018-11-17 NOTE — Assessment & Plan Note (Signed)
Previously saw neurology.  Have discussed assisted living. She declines.  Has friends checking on her, doing her laundry and getting groceries.  Follow.

## 2018-11-17 NOTE — Assessment & Plan Note (Signed)
Discussed with her today.  She will think about referral to psychiatry/counseling.  Will notify me if agreeable.

## 2018-11-27 DIAGNOSIS — H401433 Capsular glaucoma with pseudoexfoliation of lens, bilateral, severe stage: Secondary | ICD-10-CM | POA: Diagnosis not present

## 2018-11-27 DIAGNOSIS — H18232 Secondary corneal edema, left eye: Secondary | ICD-10-CM | POA: Diagnosis not present

## 2018-11-27 DIAGNOSIS — H353132 Nonexudative age-related macular degeneration, bilateral, intermediate dry stage: Secondary | ICD-10-CM | POA: Diagnosis not present

## 2018-11-27 DIAGNOSIS — H10232 Serous conjunctivitis, except viral, left eye: Secondary | ICD-10-CM | POA: Diagnosis not present

## 2018-12-18 DIAGNOSIS — H401433 Capsular glaucoma with pseudoexfoliation of lens, bilateral, severe stage: Secondary | ICD-10-CM | POA: Diagnosis not present

## 2018-12-18 DIAGNOSIS — H353132 Nonexudative age-related macular degeneration, bilateral, intermediate dry stage: Secondary | ICD-10-CM | POA: Diagnosis not present

## 2018-12-18 DIAGNOSIS — H10232 Serous conjunctivitis, except viral, left eye: Secondary | ICD-10-CM | POA: Diagnosis not present

## 2018-12-18 DIAGNOSIS — H18232 Secondary corneal edema, left eye: Secondary | ICD-10-CM | POA: Diagnosis not present

## 2018-12-30 DIAGNOSIS — R300 Dysuria: Secondary | ICD-10-CM | POA: Diagnosis not present

## 2018-12-30 DIAGNOSIS — R319 Hematuria, unspecified: Secondary | ICD-10-CM | POA: Diagnosis not present

## 2018-12-30 DIAGNOSIS — N390 Urinary tract infection, site not specified: Secondary | ICD-10-CM | POA: Diagnosis not present

## 2018-12-31 DIAGNOSIS — R300 Dysuria: Secondary | ICD-10-CM | POA: Diagnosis not present

## 2019-01-10 DIAGNOSIS — H6123 Impacted cerumen, bilateral: Secondary | ICD-10-CM | POA: Diagnosis not present

## 2019-01-10 DIAGNOSIS — H903 Sensorineural hearing loss, bilateral: Secondary | ICD-10-CM | POA: Diagnosis not present

## 2019-01-11 DIAGNOSIS — H10232 Serous conjunctivitis, except viral, left eye: Secondary | ICD-10-CM | POA: Diagnosis not present

## 2019-01-11 DIAGNOSIS — H353132 Nonexudative age-related macular degeneration, bilateral, intermediate dry stage: Secondary | ICD-10-CM | POA: Diagnosis not present

## 2019-01-11 DIAGNOSIS — H401433 Capsular glaucoma with pseudoexfoliation of lens, bilateral, severe stage: Secondary | ICD-10-CM | POA: Diagnosis not present

## 2019-01-11 DIAGNOSIS — H18232 Secondary corneal edema, left eye: Secondary | ICD-10-CM | POA: Diagnosis not present

## 2019-01-18 DIAGNOSIS — H353132 Nonexudative age-related macular degeneration, bilateral, intermediate dry stage: Secondary | ICD-10-CM | POA: Diagnosis not present

## 2019-01-18 DIAGNOSIS — H401433 Capsular glaucoma with pseudoexfoliation of lens, bilateral, severe stage: Secondary | ICD-10-CM | POA: Diagnosis not present

## 2019-01-18 DIAGNOSIS — H18232 Secondary corneal edema, left eye: Secondary | ICD-10-CM | POA: Diagnosis not present

## 2019-01-18 DIAGNOSIS — H10232 Serous conjunctivitis, except viral, left eye: Secondary | ICD-10-CM | POA: Diagnosis not present

## 2019-01-30 DIAGNOSIS — H401433 Capsular glaucoma with pseudoexfoliation of lens, bilateral, severe stage: Secondary | ICD-10-CM | POA: Diagnosis not present

## 2019-01-30 DIAGNOSIS — H353132 Nonexudative age-related macular degeneration, bilateral, intermediate dry stage: Secondary | ICD-10-CM | POA: Diagnosis not present

## 2019-01-30 DIAGNOSIS — H10232 Serous conjunctivitis, except viral, left eye: Secondary | ICD-10-CM | POA: Diagnosis not present

## 2019-01-30 DIAGNOSIS — H18232 Secondary corneal edema, left eye: Secondary | ICD-10-CM | POA: Diagnosis not present

## 2019-02-12 DIAGNOSIS — R300 Dysuria: Secondary | ICD-10-CM | POA: Diagnosis not present

## 2019-02-12 DIAGNOSIS — N390 Urinary tract infection, site not specified: Secondary | ICD-10-CM | POA: Diagnosis not present

## 2019-02-13 DIAGNOSIS — N390 Urinary tract infection, site not specified: Secondary | ICD-10-CM | POA: Diagnosis not present

## 2019-02-20 DIAGNOSIS — H353132 Nonexudative age-related macular degeneration, bilateral, intermediate dry stage: Secondary | ICD-10-CM | POA: Diagnosis not present

## 2019-02-20 DIAGNOSIS — H401433 Capsular glaucoma with pseudoexfoliation of lens, bilateral, severe stage: Secondary | ICD-10-CM | POA: Diagnosis not present

## 2019-02-20 DIAGNOSIS — H18232 Secondary corneal edema, left eye: Secondary | ICD-10-CM | POA: Diagnosis not present

## 2019-02-20 DIAGNOSIS — H10232 Serous conjunctivitis, except viral, left eye: Secondary | ICD-10-CM | POA: Diagnosis not present

## 2019-03-11 ENCOUNTER — Encounter: Payer: Self-pay | Admitting: Podiatry

## 2019-03-11 ENCOUNTER — Ambulatory Visit (INDEPENDENT_AMBULATORY_CARE_PROVIDER_SITE_OTHER): Payer: Medicare Other | Admitting: Podiatry

## 2019-03-11 ENCOUNTER — Other Ambulatory Visit: Payer: Self-pay

## 2019-03-11 DIAGNOSIS — B351 Tinea unguium: Secondary | ICD-10-CM | POA: Diagnosis not present

## 2019-03-11 DIAGNOSIS — M79675 Pain in left toe(s): Secondary | ICD-10-CM | POA: Diagnosis not present

## 2019-03-11 DIAGNOSIS — M79674 Pain in right toe(s): Secondary | ICD-10-CM

## 2019-03-11 NOTE — Progress Notes (Signed)
This patient presents to the office with chief complaint of long thick nails and diabetic feet.  This patient  says there  is  severe  pain and discomfort in her  feet.which makes it difficult to walk.  This patient says there are long thick painful nails.  These nails are painful walking and wearing shoes. Her fifth toenail right foot is especially painful.  Patient has no history of infection or drainage from both feet.  Patient is unable to  self treat his own nails . This patient presents  to the office today for treatment of the  long nails and a foot evaluation due to history of  Diabetes.  She is brought to the office by her friend.  General Appearance  Alert, conversant and in no acute stress.  Vascular  Dorsalis pedis and posterior tibial  pulses are palpable  bilaterally.  Capillary return is within normal limits  bilaterally. Temperature is within normal limits  bilaterally.  Neurologic  Senn-Weinstein monofilament wire test within normal limits  bilaterally. Muscle power within normal limits bilaterally.  Nails Thick disfigured discolored nails with subungual debris  from hallux to fifth toes bilaterally. No evidence of bacterial infection or drainage bilaterally. Painful fifth toenail right foot upon palpation.  Orthopedic  No limitations of motion of motion feet .  No crepitus or effusions noted.  No bony pathology or digital deformities noted.  Skin  normotropic skin with no porokeratosis noted bilaterally.  No signs of infections or ulcers noted.     Onychomycosis  Diabetes with no foot complications  IE  Debride nails x 10.  A dried hematoma noted under fifth toenail right foot.  A diabetic foot exam was performed and there is no evidence of any vascular or neurologic pathology.   RTC 3 months.   Gardiner Barefoot DPM

## 2019-03-13 DIAGNOSIS — H18232 Secondary corneal edema, left eye: Secondary | ICD-10-CM | POA: Diagnosis not present

## 2019-03-13 DIAGNOSIS — H353132 Nonexudative age-related macular degeneration, bilateral, intermediate dry stage: Secondary | ICD-10-CM | POA: Diagnosis not present

## 2019-03-13 DIAGNOSIS — H10232 Serous conjunctivitis, except viral, left eye: Secondary | ICD-10-CM | POA: Diagnosis not present

## 2019-03-13 DIAGNOSIS — H401433 Capsular glaucoma with pseudoexfoliation of lens, bilateral, severe stage: Secondary | ICD-10-CM | POA: Diagnosis not present

## 2019-03-19 ENCOUNTER — Other Ambulatory Visit: Payer: Self-pay

## 2019-03-21 ENCOUNTER — Ambulatory Visit (INDEPENDENT_AMBULATORY_CARE_PROVIDER_SITE_OTHER): Payer: Medicare Other | Admitting: Internal Medicine

## 2019-03-21 ENCOUNTER — Other Ambulatory Visit: Payer: Self-pay

## 2019-03-21 VITALS — BP 124/72 | HR 67 | Temp 95.2°F | Resp 16 | Wt 95.0 lb

## 2019-03-21 DIAGNOSIS — R413 Other amnesia: Secondary | ICD-10-CM

## 2019-03-21 DIAGNOSIS — K2100 Gastro-esophageal reflux disease with esophagitis, without bleeding: Secondary | ICD-10-CM

## 2019-03-21 DIAGNOSIS — F329 Major depressive disorder, single episode, unspecified: Secondary | ICD-10-CM

## 2019-03-21 DIAGNOSIS — R634 Abnormal weight loss: Secondary | ICD-10-CM | POA: Diagnosis not present

## 2019-03-21 DIAGNOSIS — G3184 Mild cognitive impairment, so stated: Secondary | ICD-10-CM | POA: Diagnosis not present

## 2019-03-21 DIAGNOSIS — K2270 Barrett's esophagus without dysplasia: Secondary | ICD-10-CM

## 2019-03-21 DIAGNOSIS — E7800 Pure hypercholesterolemia, unspecified: Secondary | ICD-10-CM

## 2019-03-21 DIAGNOSIS — Z23 Encounter for immunization: Secondary | ICD-10-CM | POA: Diagnosis not present

## 2019-03-21 DIAGNOSIS — F32A Depression, unspecified: Secondary | ICD-10-CM

## 2019-03-21 LAB — LIPID PANEL
Cholesterol: 243 mg/dL — ABNORMAL HIGH (ref 0–200)
HDL: 81.4 mg/dL (ref >39.00)
LDL Cholesterol: 142 mg/dL — ABNORMAL HIGH (ref 0–99)
NonHDL: 161.83
Total CHOL/HDL Ratio: 3
Triglycerides: 100 mg/dL (ref 0.0–149.0)
VLDL: 20 mg/dL (ref 0.0–40.0)

## 2019-03-21 LAB — CBC WITH DIFFERENTIAL/PLATELET
Basophils Absolute: 0.1 10*3/uL (ref 0.0–0.1)
Basophils Relative: 1.3 % (ref 0.0–3.0)
Eosinophils Absolute: 0.1 10*3/uL (ref 0.0–0.7)
Eosinophils Relative: 1.9 % (ref 0.0–5.0)
HCT: 38.9 % (ref 36.0–46.0)
Hemoglobin: 13.1 g/dL (ref 12.0–15.0)
Lymphocytes Relative: 16.8 % (ref 12.0–46.0)
Lymphs Abs: 1.1 10*3/uL (ref 0.7–4.0)
MCHC: 33.7 g/dL (ref 30.0–36.0)
MCV: 92.1 fl (ref 78.0–100.0)
Monocytes Absolute: 0.6 10*3/uL (ref 0.1–1.0)
Monocytes Relative: 8.4 % (ref 3.0–12.0)
Neutro Abs: 4.8 10*3/uL (ref 1.4–7.7)
Neutrophils Relative %: 71.6 % (ref 43.0–77.0)
Platelets: 276 10*3/uL (ref 150.0–400.0)
RBC: 4.23 Mil/uL (ref 3.87–5.11)
RDW: 14.6 % (ref 11.5–15.5)
WBC: 6.7 10*3/uL (ref 4.0–10.5)

## 2019-03-21 LAB — COMPREHENSIVE METABOLIC PANEL
ALT: 11 U/L (ref 0–35)
AST: 17 U/L (ref 0–37)
Albumin: 4.3 g/dL (ref 3.5–5.2)
Alkaline Phosphatase: 45 U/L (ref 39–117)
BUN: 25 mg/dL — ABNORMAL HIGH (ref 6–23)
CO2: 27 mEq/L (ref 19–32)
Calcium: 9.9 mg/dL (ref 8.4–10.5)
Chloride: 103 mEq/L (ref 96–112)
Creatinine, Ser: 0.93 mg/dL (ref 0.40–1.20)
GFR: 55.8 mL/min — ABNORMAL LOW (ref >60.00)
Glucose, Bld: 86 mg/dL (ref 70–99)
Potassium: 4.3 mEq/L (ref 3.5–5.1)
Sodium: 138 mEq/L (ref 135–145)
Total Bilirubin: 0.4 mg/dL (ref 0.2–1.2)
Total Protein: 7.3 g/dL (ref 6.0–8.3)

## 2019-03-21 LAB — TSH: TSH: 1.36 u[IU]/mL (ref 0.35–4.50)

## 2019-03-21 LAB — VITAMIN B12: Vitamin B-12: 371 pg/mL (ref 211–911)

## 2019-03-21 NOTE — Progress Notes (Signed)
Patient ID: Christy Cisneros, female   DOB: Apr 15, 1920, 85 y.o.   MRN: [id number]   Subjective:    Patient ID: Christy Cisneros, female    DOB: [birth year], 85 y.o.   MRN: [id number]  HPI  Patient here for a scheduled follow up.  She is accompanied by her friend Pamala Hurry.  Reports she is doing relatively well.  Is walking with a friend every morning.  No chest pain.  No sob.  Eating.  Friends still checking in on he regularly.  They bring her food.  No abdominal pian.  Bowels moving.  Some toe pain - second toe putting pressure on first toe.  Discussed toe divider.  Overall they feel things are stable and that she is doing ok.     Past Medical History:  Diagnosis Date  . Barrett's esophagus with esophagitis   . Chronic bronchitis ([place])   . Depression   . GERD (gastroesophageal reflux disease)   . Glaucoma   . Hypercholesteremia   . Osteoarthritis   . Osteoporosis   . Sleep apnea    has CPAP  . TIA (transient ischemic attack)    Past Surgical History:  Procedure Laterality Date  . BUNIONECTOMY     left  . ROTATOR CUFF REPAIR     right   Family History  Problem Relation Age of Onset  . Breast cancer Sister   . Heart disease Mother   . Alcohol abuse Father    Social History   Socioeconomic History  . Marital status: Single    Spouse name: Not on file  . Number of children: Not on file  . Years of education: Not on file  . Highest education level: Not on file  Occupational History  . Not on file  Social Needs  . Financial resource strain: Not hard at all  . Food insecurity    Worry: Never true    Inability: Never true  . Transportation needs    Medical: No    Non-medical: No  Tobacco Use  . Smoking status: Never Smoker  . Smokeless tobacco: Never Used  Substance and Sexual Activity  . Alcohol use: Yes    Alcohol/week: 0.0 standard drinks    Comment: wine/brandy occasional  . Drug use: No  . Sexual activity: Never  Lifestyle  . Physical activity    Days  per week: 7 days    Minutes per session: 40 min  . Stress: Only a little  Relationships  . Social Herbalist on phone: Three times a week    Gets together: Three times a week    Attends religious service: Never    Active member of club or organization: No    Attends meetings of clubs or organizations: Never    Relationship status: Never married  Other Topics Concern  . Not on file  Social History Narrative   Lives alone no kids    Outpatient Encounter Medications as of 03/21/2019  Medication Sig  . acetaminophen (TYLENOL) 325 MG tablet Take 2 tablets (650 mg total) by mouth every 8 (eight) hours as needed for mild pain.  Marland Kitchen nystatin cream (MYCOSTATIN) Apply 1 application topically 2 (two) times daily. Under the right breast  . omeprazole (PRILOSEC) 20 MG capsule TAKE ONE CAPSULE TWICE A DAY BEFORE MEALS  . tobramycin (TOBREX) 0.3 % ophthalmic solution   . [DISCONTINUED] Aspirin Buf,CaCarb-MgCarb-MgO, 81 MG TABS Take by mouth.  . [DISCONTINUED] carboxymethylcellul-glycerin (OPTIVE) 0.5-0.9 % ophthalmic solution Apply to  eye.  . [DISCONTINUED] dorzolamide-timolol (COSOPT) 22.3-6.8 MG/ML ophthalmic solution   . [DISCONTINUED] hydrOXYzine (ATARAX/VISTARIL) 10 MG tablet Take 1 tablet (10 mg total) by mouth 2 (two) times daily as needed.  . [DISCONTINUED] Hypochlorous Acid 0.012 % SOLN Apply topically.  . [DISCONTINUED] nitrofurantoin, macrocrystal-monohydrate, (MACROBID) 100 MG capsule TK 1 C PO Q 12 H WF  . [DISCONTINUED] phenazopyridine (PYRIDIUM) 200 MG tablet TK 1 T PO TID AFTER MEALS PRN  . [DISCONTINUED] prednisoLONE acetate (PRED FORTE) 1 % ophthalmic suspension    No facility-administered encounter medications on file as of 03/21/2019.     Review of Systems  Constitutional: Negative for appetite change and unexpected weight change.  HENT: Negative for congestion and sinus pressure.   Respiratory: Negative for cough, chest tightness and shortness of breath.    Cardiovascular: Negative for chest pain, palpitations and leg swelling.  Gastrointestinal: Negative for abdominal pain, diarrhea, nausea and vomiting.  Genitourinary: Negative for difficulty urinating and dysuria.  Musculoskeletal: Negative for joint swelling and myalgias.  Skin: Negative for color change and rash.  Neurological: Negative for dizziness, light-headedness and headaches.  Psychiatric/Behavioral: Negative for agitation and dysphoric mood.       Objective:    Physical Exam Constitutional:      General: She is not in acute distress.    Appearance: Normal appearance.  HENT:     Head: Normocephalic and atraumatic.     Right Ear: External ear normal.     Left Ear: External ear normal.  Eyes:     General: No scleral icterus.       Right eye: No discharge.        Left eye: No discharge.  Neck:     Musculoskeletal: Neck supple. No muscular tenderness.     Thyroid: No thyromegaly.  Cardiovascular:     Rate and Rhythm: Normal rate and regular rhythm.  Pulmonary:     Effort: No respiratory distress.     Breath sounds: Normal breath sounds. No wheezing.  Abdominal:     General: Bowel sounds are normal.     Palpations: Abdomen is soft.     Tenderness: There is no abdominal tenderness.  Musculoskeletal:        General: No swelling or tenderness.  Lymphadenopathy:     Cervical: No cervical adenopathy.  Skin:    Findings: No erythema or rash.  Neurological:     Mental Status: She is alert.  Psychiatric:        Mood and Affect: Mood normal.        Behavior: Behavior normal.     BP 124/72   Pulse 67   Temp (!) 95.2 F (35.1 C) (Temporal)   Resp 16   Wt 95 lb (43.1 kg)   SpO2 97%   BMI 17.38 kg/m  Wt Readings from Last 3 Encounters:  03/21/19 95 lb (43.1 kg)  08/22/18 100 lb 9.6 oz (45.6 kg)  07/10/18 97 lb 9.6 oz (44.3 kg)     Lab Results  Component Value Date   WBC 6.7 03/21/2019   HGB 13.1 03/21/2019   HCT 38.9 03/21/2019   PLT 276.0 03/21/2019    GLUCOSE 86 03/21/2019   CHOL 243 (H) 03/21/2019   TRIG 100.0 03/21/2019   HDL 81.40 03/21/2019   LDLCALC 142 (H) 03/21/2019   ALT 11 03/21/2019   AST 17 03/21/2019   NA 138 03/21/2019   K 4.3 03/21/2019   CL 103 03/21/2019   CREATININE 0.93 03/21/2019   BUN 25 (  H) 03/21/2019   CO2 27 03/21/2019   TSH 1.36 03/21/2019   INR 0.96 03/16/2017   HGBA1C 5.4 03/17/2017    No results found.     Assessment & Plan:   Problem List Items Addressed This Visit    BARRETTS ESOPHAGUS    Should be taking omeprazole. Does not appear to take regularly.  Denies any upper symptoms.  Has declined further w/up or evaluation.        Depression    No significant symptoms now.  Follow.  Has friends checking on her regularly.        GERD (gastroesophageal reflux disease)    No upper symptoms.        HYPERCHOLESTEROLEMIA    Check lipid panel today.       Relevant Orders   CBC with Differential/Platelet (Completed)   Comprehensive metabolic panel (Completed)   Lipid panel (Completed)   Memory change - Primary   Relevant Orders   TSH (Completed)   Vitamin B12 (Completed)   Mild cognitive impairment    Previously saw neurology.  Again discussed with her today regarding assisted living.  She declines.  Has friends who get her groceries, foot, check on her and do her laundry.  Follow.  Stable.        Weight loss    Pt with weight loss.  Discussed with her today.  Encourage increased po intake.  Follow.           Einar Pheasant, MD

## 2019-03-25 ENCOUNTER — Telehealth: Payer: Self-pay | Admitting: *Deleted

## 2019-03-25 ENCOUNTER — Encounter: Payer: Self-pay | Admitting: Internal Medicine

## 2019-03-25 DIAGNOSIS — R634 Abnormal weight loss: Secondary | ICD-10-CM | POA: Insufficient documentation

## 2019-03-25 NOTE — Assessment & Plan Note (Signed)
Previously saw neurology.  Again discussed with her today regarding assisted living.  She declines.  Has friends who get her groceries, foot, check on her and do her laundry.  Follow.  Stable.

## 2019-03-25 NOTE — Assessment & Plan Note (Signed)
Should be taking omeprazole. Does not appear to take regularly.  Denies any upper symptoms.  Has declined further w/up or evaluation.

## 2019-03-25 NOTE — Assessment & Plan Note (Signed)
Pt with weight loss.  Discussed with her today.  Encourage increased po intake.  Follow.

## 2019-03-25 NOTE — Assessment & Plan Note (Signed)
No upper symptoms.

## 2019-03-25 NOTE — Assessment & Plan Note (Signed)
Check lipid panel today 

## 2019-03-25 NOTE — Telephone Encounter (Signed)
Pt states she returned to her house and a message from our office was on her phone, and it said to call if she did not understand the message, and she would like a call back.

## 2019-03-25 NOTE — Assessment & Plan Note (Signed)
No significant symptoms now.  Follow.  Has friends checking on her regularly.

## 2019-03-29 DIAGNOSIS — H401433 Capsular glaucoma with pseudoexfoliation of lens, bilateral, severe stage: Secondary | ICD-10-CM | POA: Diagnosis not present

## 2019-03-29 DIAGNOSIS — H353132 Nonexudative age-related macular degeneration, bilateral, intermediate dry stage: Secondary | ICD-10-CM | POA: Diagnosis not present

## 2019-03-29 DIAGNOSIS — H18232 Secondary corneal edema, left eye: Secondary | ICD-10-CM | POA: Diagnosis not present

## 2019-03-29 DIAGNOSIS — H10232 Serous conjunctivitis, except viral, left eye: Secondary | ICD-10-CM | POA: Diagnosis not present

## 2019-04-01 ENCOUNTER — Telehealth: Payer: Self-pay | Admitting: Internal Medicine

## 2019-04-01 ENCOUNTER — Ambulatory Visit: Payer: Medicare Other | Admitting: Family Medicine

## 2019-04-01 NOTE — Telephone Encounter (Signed)
Pt is requesting to have a home health nurse come out to the home. Pt cut her leg and says in the past PCP was able to have someone to come out to the home to care for her cut.    CB: [phone]

## 2019-04-01 NOTE — Telephone Encounter (Signed)
FYI Pt scheduled for tomorrow  Spoke to patient's friend, Rhunette Croft (ok per [facility]) who said that patient bumped into something on Saturday and cut her shin.  Pt's friend believes it's the left leg.  Mrs. Mancel Bale said that patient called her last night.  Mrs. Mancel Bale doesn't know how deep the cut is because patient wouldn't allow her to remove the bandage.  Mrs. Mancel Bale said that patient's skin is fragile and tears easily.    Pt wants to have a home health nurse come out to the home to help care for the cut.  Pt says that Dr. Nicki Reaper has had someone come out to the home in the past to help care for her.    No appts left for today with any provider.  Scheduled patient an appt with Philis Nettle, NP tomorrow morning at 9:00 am. Informed Mrs. Mancel Bale that appt can be cancelled if Dr. Nicki Reaper decides to send out a home health nurse.

## 2019-04-01 NOTE — Telephone Encounter (Signed)
I put her on the schedule for tomorrow at 9:20. Looks like she may have been put on todays schedule by mistake

## 2019-04-01 NOTE — Telephone Encounter (Signed)
Spoke to patient's friend, Rhunette Croft (ok per [facility]) who said that patient bumped into something on Saturday and cut her shin.  Pt's friend believes it's the left leg.  Mrs. Mancel Bale said that patient called her last night.  Mrs. Mancel Bale doesn't know how deep the cut is because patient wouldn't allow her to remove the bandage.  Mrs. Mancel Bale said that patient's skin is fragile and tears easily.    Pt wants to have a home health nurse come out to the home to help care for the cut.  Pt says that Dr. Nicki Reaper has had someone come out to the home in the past to help care for her.    No appts left for today with any provider.  Scheduled patient an appt with Philis Nettle, NP tomorrow morning at 9:00 am. Informed Mrs. Mancel Bale that appt can be cancelled if Dr. Nicki Reaper decides to send out a home health nurse.

## 2019-04-01 NOTE — Telephone Encounter (Signed)
Are you okay with home health coming out to dress wound, etc.?

## 2019-04-01 NOTE — Telephone Encounter (Signed)
I am ok with home health nurse helping to care for pt, but it still sounds like a good idea to have it looked out here to see what is needed.

## 2019-04-02 ENCOUNTER — Ambulatory Visit (INDEPENDENT_AMBULATORY_CARE_PROVIDER_SITE_OTHER): Payer: Medicare Other | Admitting: Family Medicine

## 2019-04-02 ENCOUNTER — Other Ambulatory Visit: Payer: Self-pay

## 2019-04-02 VITALS — BP 110/60 | HR 88 | Temp 96.4°F | Resp 16 | Ht 60.0 in | Wt 95.0 lb

## 2019-04-02 DIAGNOSIS — S81801A Unspecified open wound, right lower leg, initial encounter: Secondary | ICD-10-CM

## 2019-04-02 MED ORDER — DOXYCYCLINE HYCLATE 100 MG PO TABS: 100.0000 mg | ORAL_TABLET | Freq: Two times a day (BID) | ORAL | 0 refills | Status: DC

## 2019-04-02 NOTE — Progress Notes (Signed)
Subjective:    Patient ID: Christy Cisneros, female    DOB: [birth year], 85 y.o.   MRN: [id number]  HPI   Patient presents to clinic due to cut on left lower leg.  States she bumped her leg on the corner of a coffee table in her home about a week ago.  She has been trying to take care of it herself, but is not sure she is doing a good job to heal it.  Patient would like a home health nurse to come and see her and evaluate the area as well as help her do wound care.  Area is a little tender to touch, but has dried up and scabbed over with a blister.   Patient Active Problem List   Diagnosis Date Noted  . Weight loss 03/25/2019  . Pain due to onychomycosis of toenails of both feet 03/11/2019  . Depression 05/06/2018  . Abrasion 02/28/2018  . History of CVA (cerebrovascular accident) 03/16/2017  . Memory change 09/04/2016  . Pneumonia 07/17/2016  . Mild cognitive impairment 05/15/2016  . Dizziness 04/21/2016  . Neck pain 02/11/2016  . Muscle cramps 11/14/2015  . Urine incontinence 08/16/2015  . Hematoma 08/16/2015  . Weakness 07/12/2015  . Headache 06/16/2015  . Unsteady gait 06/01/2015  . GERD (gastroesophageal reflux disease) 08/11/2014  . DYSPNEA 04/17/2009  . HYPERCHOLESTEROLEMIA 04/16/2009  . Obstructive sleep apnea 04/16/2009  . Glaucoma 04/16/2009  . Transient cerebral ischemia 04/16/2009  . BARRETTS ESOPHAGUS 04/16/2009  . OSTEOARTHRITIS 04/16/2009  . Osteoporosis 04/16/2009   Social History   Tobacco Use  . Smoking status: Never Smoker  . Smokeless tobacco: Never Used  Substance Use Topics  . Alcohol use: Yes    Alcohol/week: 0.0 standard drinks    Comment: wine/brandy occasional   Review of Systems  Constitutional: Negative for chills, fatigue and fever.  HENT: Negative for congestion, ear pain, sinus pain and sore throat.   Eyes: Negative.   Respiratory: Negative for cough, shortness of breath and wheezing.   Cardiovascular: Negative for chest pain,  palpitations and leg swelling.  Gastrointestinal: Negative for abdominal pain, diarrhea, nausea and vomiting.  Genitourinary: Negative for dysuria, frequency and urgency.  Musculoskeletal: Negative for arthralgias and myalgias.  Skin: +cut on left leg Neurological: Negative for syncope, light-headedness and headaches.  Psychiatric/Behavioral: The patient is not nervous/anxious.       Objective:   Physical Exam Vitals signs and nursing note reviewed.  Constitutional:      General: She is not in acute distress.    Appearance: She is not toxic-appearing.  HENT:     Head: Normocephalic and atraumatic.  Cardiovascular:     Rate and Rhythm: Normal rate and regular rhythm.  Pulmonary:     Effort: Pulmonary effort is normal. No respiratory distress.     Breath sounds: Normal breath sounds.  Skin:    General: Skin is warm and dry.          Comments: +wound on right side of lower leg represented by red mark on diagram.  It is not open or draining, area has scabbed over and a small blister has formed that is approximately the size of a nickel.  It is tender to touch. Not hot or red. Area cleansed with a alcohol swab and patted dry.  Triple antibiotic ointment placed and nonadherent gauze covered area and it was wrapped with Kerlix and secured with tape.  Neurological:     Mental Status: She is alert. Mental status is  at baseline.     Gait: Gait normal.  Psychiatric:        Mood and Affect: Mood normal.        Behavior: Behavior normal.    Today's Vitals   04/02/19 0928  BP: 110/60  Pulse: 88  Resp: 16  Temp: (!) 96.4 F (35.8 C)  TempSrc: Temporal  SpO2: 98%  Weight: 95 lb (43.1 kg)  Height: 5' (1.524 m)   Body mass index is 18.55 kg/m.    Assessment & Plan:   Wound on right lower extremity-area appears to be healing, but it is somewhat tender to touch.  I will treat patient with Doxy 100 mg twice daily for 7 days to cover for possible skin infection.  She will do wound care  daily, advised to wash skin with soap and water, pat dry, apply thin layer of antibiotic ointment and cover with either gauze or Band-Aid.  Home health referral placed per patient request.  Patient's family member states she should be able to help [name] with her wound dressing changes until home health is able to come out and see her.  Advised to monitor area for any worsening tenderness, redness, discharge or drainage and let us know if anything changes.  Patient will otherwise keep all regular follow-ups with PCP as planned and return to clinic sooner if any issues arise.

## 2019-04-02 NOTE — Patient Instructions (Signed)
Wash skin with soap water, pat dry  Apply thin layer of ointment and cover with dressing  Change daily  Home health referral placed

## 2019-04-03 DIAGNOSIS — G4733 Obstructive sleep apnea (adult) (pediatric): Secondary | ICD-10-CM | POA: Diagnosis not present

## 2019-04-03 DIAGNOSIS — J410 Simple chronic bronchitis: Secondary | ICD-10-CM | POA: Diagnosis not present

## 2019-04-03 DIAGNOSIS — S81811D Laceration without foreign body, right lower leg, subsequent encounter: Secondary | ICD-10-CM | POA: Diagnosis not present

## 2019-04-03 DIAGNOSIS — K2270 Barrett's esophagus without dysplasia: Secondary | ICD-10-CM | POA: Diagnosis not present

## 2019-04-03 DIAGNOSIS — K209 Esophagitis, unspecified: Secondary | ICD-10-CM | POA: Diagnosis not present

## 2019-04-03 DIAGNOSIS — M6281 Muscle weakness (generalized): Secondary | ICD-10-CM | POA: Diagnosis not present

## 2019-04-03 DIAGNOSIS — H409 Unspecified glaucoma: Secondary | ICD-10-CM | POA: Diagnosis not present

## 2019-04-03 DIAGNOSIS — M810 Age-related osteoporosis without current pathological fracture: Secondary | ICD-10-CM | POA: Diagnosis not present

## 2019-04-03 DIAGNOSIS — Z9989 Dependence on other enabling machines and devices: Secondary | ICD-10-CM | POA: Diagnosis not present

## 2019-04-03 DIAGNOSIS — R262 Difficulty in walking, not elsewhere classified: Secondary | ICD-10-CM | POA: Diagnosis not present

## 2019-04-03 DIAGNOSIS — F339 Major depressive disorder, recurrent, unspecified: Secondary | ICD-10-CM | POA: Diagnosis not present

## 2019-04-05 DIAGNOSIS — S81811D Laceration without foreign body, right lower leg, subsequent encounter: Secondary | ICD-10-CM | POA: Diagnosis not present

## 2019-04-05 DIAGNOSIS — J410 Simple chronic bronchitis: Secondary | ICD-10-CM | POA: Diagnosis not present

## 2019-04-05 DIAGNOSIS — H409 Unspecified glaucoma: Secondary | ICD-10-CM | POA: Diagnosis not present

## 2019-04-05 DIAGNOSIS — K2270 Barrett's esophagus without dysplasia: Secondary | ICD-10-CM | POA: Diagnosis not present

## 2019-04-05 DIAGNOSIS — K209 Esophagitis, unspecified: Secondary | ICD-10-CM | POA: Diagnosis not present

## 2019-04-05 DIAGNOSIS — F339 Major depressive disorder, recurrent, unspecified: Secondary | ICD-10-CM | POA: Diagnosis not present

## 2019-04-09 DIAGNOSIS — F339 Major depressive disorder, recurrent, unspecified: Secondary | ICD-10-CM | POA: Diagnosis not present

## 2019-04-09 DIAGNOSIS — K209 Esophagitis, unspecified: Secondary | ICD-10-CM | POA: Diagnosis not present

## 2019-04-09 DIAGNOSIS — H409 Unspecified glaucoma: Secondary | ICD-10-CM | POA: Diagnosis not present

## 2019-04-09 DIAGNOSIS — S81811D Laceration without foreign body, right lower leg, subsequent encounter: Secondary | ICD-10-CM | POA: Diagnosis not present

## 2019-04-09 DIAGNOSIS — J410 Simple chronic bronchitis: Secondary | ICD-10-CM | POA: Diagnosis not present

## 2019-04-09 DIAGNOSIS — K2270 Barrett's esophagus without dysplasia: Secondary | ICD-10-CM | POA: Diagnosis not present

## 2019-04-11 ENCOUNTER — Emergency Department: Payer: Medicare Other

## 2019-04-11 ENCOUNTER — Other Ambulatory Visit: Payer: Self-pay

## 2019-04-11 ENCOUNTER — Emergency Department
Admission: EM | Admit: 2019-04-11 | Discharge: 2019-04-11 | Disposition: A | Discharge status: Home / Self Care | Payer: Medicare Other | Attending: Emergency Medicine | Admitting: Emergency Medicine

## 2019-04-11 DIAGNOSIS — W228XXA Striking against or struck by other objects, initial encounter: Secondary | ICD-10-CM | POA: Insufficient documentation

## 2019-04-11 DIAGNOSIS — Z79899 Other long term (current) drug therapy: Secondary | ICD-10-CM | POA: Insufficient documentation

## 2019-04-11 DIAGNOSIS — R410 Disorientation, unspecified: Secondary | ICD-10-CM | POA: Diagnosis not present

## 2019-04-11 DIAGNOSIS — W19XXXA Unspecified fall, initial encounter: Secondary | ICD-10-CM | POA: Diagnosis not present

## 2019-04-11 DIAGNOSIS — S81811A Laceration without foreign body, right lower leg, initial encounter: Secondary | ICD-10-CM | POA: Diagnosis not present

## 2019-04-11 DIAGNOSIS — Y999 Unspecified external cause status: Secondary | ICD-10-CM | POA: Diagnosis not present

## 2019-04-11 DIAGNOSIS — Y929 Unspecified place or not applicable: Secondary | ICD-10-CM | POA: Diagnosis not present

## 2019-04-11 DIAGNOSIS — Z23 Encounter for immunization: Secondary | ICD-10-CM | POA: Diagnosis not present

## 2019-04-11 DIAGNOSIS — S0990XA Unspecified injury of head, initial encounter: Secondary | ICD-10-CM | POA: Diagnosis not present

## 2019-04-11 DIAGNOSIS — M79604 Pain in right leg: Secondary | ICD-10-CM | POA: Diagnosis not present

## 2019-04-11 DIAGNOSIS — Y939 Activity, unspecified: Secondary | ICD-10-CM | POA: Diagnosis not present

## 2019-04-11 DIAGNOSIS — R52 Pain, unspecified: Secondary | ICD-10-CM | POA: Diagnosis not present

## 2019-04-11 DIAGNOSIS — J42 Unspecified chronic bronchitis: Secondary | ICD-10-CM | POA: Diagnosis not present

## 2019-04-11 LAB — CBC WITH DIFFERENTIAL/PLATELET
Abs Immature Granulocytes: 0.01 10*3/uL (ref 0.00–0.07)
Basophils Absolute: 0.1 10*3/uL (ref 0.0–0.1)
Basophils Relative: 2 %
Eosinophils Absolute: 0.3 10*3/uL (ref 0.0–0.5)
Eosinophils Relative: 5 %
HCT: 37.8 % (ref 36.0–46.0)
Hemoglobin: 12.2 g/dL (ref 12.0–15.0)
Immature Granulocytes: 0 %
Lymphocytes Relative: 22 %
Lymphs Abs: 1.1 10*3/uL (ref 0.7–4.0)
MCH: 30.2 pg (ref 26.0–34.0)
MCHC: 32.3 g/dL (ref 30.0–36.0)
MCV: 93.6 fL (ref 80.0–100.0)
Monocytes Absolute: 0.4 10*3/uL (ref 0.1–1.0)
Monocytes Relative: 8 %
Neutro Abs: 3.2 10*3/uL (ref 1.7–7.7)
Neutrophils Relative %: 63 %
Platelets: 281 10*3/uL (ref 150–400)
RBC: 4.04 MIL/uL (ref 3.87–5.11)
RDW: 14.4 % (ref 11.5–15.5)
WBC: 5.1 10*3/uL (ref 4.0–10.5)
nRBC: 0 % (ref 0.0–0.2)

## 2019-04-11 LAB — COMPREHENSIVE METABOLIC PANEL
ALT: 13 U/L (ref 0–44)
AST: 21 U/L (ref 15–41)
Albumin: 3.9 g/dL (ref 3.5–5.0)
Alkaline Phosphatase: 49 U/L (ref 38–126)
Anion gap: 11 (ref 5–15)
BUN: 30 mg/dL — ABNORMAL HIGH (ref 8–23)
CO2: 24 mmol/L (ref 22–32)
Calcium: 9.3 mg/dL (ref 8.9–10.3)
Chloride: 105 mmol/L (ref 98–111)
Creatinine, Ser: 0.93 mg/dL (ref 0.44–1.00)
GFR calc Af Amer: >60 mL/min (ref >60)
GFR calc non Af Amer: 52 mL/min — ABNORMAL LOW (ref >60)
Glucose, Bld: 99 mg/dL (ref 70–99)
Potassium: 3.8 mmol/L (ref 3.5–5.1)
Sodium: 140 mmol/L (ref 135–145)
Total Bilirubin: 1 mg/dL (ref 0.3–1.2)
Total Protein: 6.7 g/dL (ref 6.5–8.1)

## 2019-04-11 LAB — TSH: TSH: 1.29 u[IU]/mL (ref 0.350–4.500)

## 2019-04-11 LAB — TROPONIN I (HIGH SENSITIVITY): Troponin I (High Sensitivity): 14 ng/L (ref <18)

## 2019-04-11 MED ORDER — TETANUS-DIPHTH-ACELL PERTUSSIS 5-2.5-18.5 LF-MCG/0.5 IM SUSP
0.5000 mL | Freq: Once | INTRAMUSCULAR | Status: AC
  Administered 2019-04-11: 10:00:00 0.5 mL via INTRAMUSCULAR
  Filled 2019-04-11: qty 0.5

## 2019-04-11 NOTE — ED Notes (Signed)
Patient transported to CT 

## 2019-04-11 NOTE — Discharge Instructions (Addendum)
Dr. Nicki Reaper will call you for follow-up in a week.  Please have your friend change her dressing at least every other day.  Continue to wash it gently and pat it dry.  Do not rub it.  Can use antibiotic ointment on it.  If you cannot get anybody to help you change the dressing you can leave it on for up to 3 or 4 days and then come back here to get it changed.  Return sooner for any worsening or increasing pain or redness.

## 2019-04-11 NOTE — ED Triage Notes (Signed)
Pt arrives from home by EMS for right leg pain.  Met EMS in front yard walking with FD.  EMS 155/84 pulse 80 RR16, 90% spo2, temp 98.0.  [place] a week ago.

## 2019-04-11 NOTE — ED Provider Notes (Signed)
[facility] Emergency Department Provider Note   ____________________________________________   First MD Initiated Contact with Patient 04/11/19 [phone]     (approximate)  I have reviewed the triage vital signs and the nursing notes.   HISTORY  Chief Complaint Leg Pain    HPI Christy Cisneros is a 85 y.o. female who cut her leg by bumping it on something at the end of last month.  She called the doctor and nurse practitioner saw her the next day.  Home health was arranged but patient reports home health only has been out once.  Patient is worried about the leg as it still is tender and she keeps bumping into things and making it hurt.  Patient is very deaf and is difficult to get a history however family member was supposed to be helping with the dressing changes.         Past Medical History:  Diagnosis Date  . Barrett's esophagus with esophagitis   . Chronic bronchitis ([place])   . Depression   . GERD (gastroesophageal reflux disease)   . Glaucoma   . Hypercholesteremia   . Osteoarthritis   . Osteoporosis   . Sleep apnea    has CPAP  . TIA (transient ischemic attack)     Patient Active Problem List   Diagnosis Date Noted  . Weight loss 03/25/2019  . Pain due to onychomycosis of toenails of both feet 03/11/2019  . Depression 05/06/2018  . Abrasion 02/28/2018  . History of CVA (cerebrovascular accident) 03/16/2017  . Memory change 09/04/2016  . Pneumonia 07/17/2016  . Mild cognitive impairment 05/15/2016  . Dizziness 04/21/2016  . Neck pain 02/11/2016  . Muscle cramps 11/14/2015  . Urine incontinence 08/16/2015  . Hematoma 08/16/2015  . Weakness 07/12/2015  . Headache 06/16/2015  . Unsteady gait 06/01/2015  . GERD (gastroesophageal reflux disease) 08/11/2014  . DYSPNEA 04/17/2009  . HYPERCHOLESTEROLEMIA 04/16/2009  . Obstructive sleep apnea 04/16/2009  . Glaucoma 04/16/2009  . Transient cerebral ischemia 04/16/2009  . BARRETTS  ESOPHAGUS 04/16/2009  . OSTEOARTHRITIS 04/16/2009  . Osteoporosis 04/16/2009    Past Surgical History:  Procedure Laterality Date  . BUNIONECTOMY     left  . ROTATOR CUFF REPAIR     right    Prior to Admission medications   Medication Sig Start Date End Date Taking? Authorizing Provider  acetaminophen (TYLENOL) 325 MG tablet Take 2 tablets (650 mg total) by mouth every 8 (eight) hours as needed for mild pain. 07/10/18   Jodelle Green, FNP  doxycycline (VIBRA-TABS) 100 MG tablet Take 1 tablet (100 mg total) by mouth 2 (two) times daily. 04/02/19   Jodelle Green, FNP  nystatin cream (MYCOSTATIN) Apply 1 application topically 2 (two) times daily. Under the right breast 08/22/18   Einar Pheasant, MD  omeprazole (PRILOSEC) 20 MG capsule TAKE ONE CAPSULE TWICE A DAY BEFORE MEALS 11/17/16   Einar Pheasant, MD  tobramycin (TOBREX) 0.3 % ophthalmic solution  11/02/17   [provider]    Allergies Alphagan [brimonidine], Avelox [moxifloxacin hcl in nacl], Bacitracin, Cefdinir, Cephalexin, Clarithromycin, Codeine, Erythromycin, Hydrocodone-acetaminophen, Lansoprazole, Polymyxin b, and Vigamox [moxifloxacin]  Family History  Problem Relation Age of Onset  . Breast cancer Sister   . Heart disease Mother   . Alcohol abuse Father     Social History Social History   Tobacco Use  . Smoking status: Never Smoker  . Smokeless tobacco: Never Used  Substance Use Topics  . Alcohol use: Yes  Alcohol/week: 0.0 standard drinks    Comment: wine/brandy occasional  . Drug use: No    Review of Systems  Constitutional: No fever/chills Eyes: No visual changes. ENT: No sore throat. Cardiovascular: Denies chest pain. Respiratory: Denies shortness of breath. Gastrointestinal: No abdominal pain.  No nausea, no vomiting.  No diarrhea.  No constipation. Genitourinary: Negative for dysuria. Musculoskeletal: Negative for back pain. Skin: Negative for rash. Neurological: Negative for  headaches, focal weakness   ____________________________________________   PHYSICAL EXAM:  VITAL SIGNS: ED Triage Vitals  Enc Vitals Group     BP 04/11/19 0740 (!) 117/97     Pulse Rate 04/11/19 0740 89     Resp --      Temp 04/11/19 0740 97.7 F (36.5 C)     Temp Source 04/11/19 0740 Oral     SpO2 04/11/19 0739 98 %     Weight 04/11/19 0742 95 lb (43.1 kg)     Height 04/11/19 0742 5' (1.524 m)     Head Circumference --      Peak Flow --      Pain Score 04/11/19 0741 9     Pain Loc --      Pain Edu? --      Excl. in [place]? --     Constitutional: Alert and oriented. Well appearing and in no acute distress. Eyes: Conjunctivae are normal.  Head: Atraumatic. Nose: No congestion/rhinnorhea. Mouth/Throat: Mucous membranes are moist.  Oropharynx non-erythematous. Neck: No stridor.   Cardiovascular: Normal rate, regular rhythm. Grossly normal heart sounds.  Good peripheral circulation. Respiratory: Normal respiratory effort.  No retractions. Lungs CTAB. Gastrointestinal: Soft and nontender. No distention. No abdominal bruits. No CVA tenderness. Musculoskeletal: No lower extremity tenderness nor edema.   Neurologic:  Normal speech and language. No gross focal neurologic deficits are appreciated. No gait instability. Skin:  Skin is warm, dry and intact except for healing skin tear on the outer side of the right lower leg.  This looks clean and dry there is no sign of infection.  Patient has bilateral venous stasis changes.  There is no edema.   ____________________________________________   LABS (all labs ordered are listed, but only abnormal results are displayed)  Labs Reviewed  CBC WITH DIFFERENTIAL/PLATELET  COMPREHENSIVE METABOLIC PANEL  URINALYSIS, COMPLETE (UACMP) WITH MICROSCOPIC  TSH  TROPONIN I (HIGH SENSITIVITY)   ____________________________________________  EKG  EKG read interpreted by me shows normal sinus rhythm rate of 63 normal axis no acute ST-T changes  ____________________________________________  RADIOLOGY  ED MD interpretation: Chest x-ray read by radiology reviewed by me shows no acute disease  Official radiology report(s): Dg Chest Portable 1 View  Result Date: 04/11/2019 CLINICAL DATA:  Chronic bronchitis, Barrett's esophagus EXAM: PORTABLE CHEST 1 VIEW COMPARISON:  09/15/2016 FINDINGS: There is no focal consolidation. There is no pleural effusion or pneumothorax. The heart and mediastinal contours are unremarkable. There is a left shoulder arthroplasty IMPRESSION: No active disease. Electronically Signed   By: Kathreen Devoid   On: 04/11/2019 08:41    ____________________________________________   PROCEDURES  Procedure(s) performed (including Critical Care):  Procedures   ____________________________________________   INITIAL IMPRESSION / ASSESSMENT AND PLAN / ED COURSE  Wound is healing well.  Patient however says her memory is not working well.  Indeed she does not remember the names of people who are mentioned in her chart she knows she knows them but she cannot say who they are.  She does not know her address except for her apartment  number.  She does not know who her doctor is even though she talked to her just about 12 days ago.   Discussed patient with Dr. Bary Leriche nurse.  She will schedule a follow-up appointment with the patient at least by the beginning of next week to check on her in the wound.   Christy Cisneros was evaluated in Emergency Department on 04/11/2019 for the symptoms described in the history of present illness. She was evaluated in the context of the global COVID-19 pandemic, which necessitated consideration that the patient might be at risk for infection with the SARS-CoV-2 virus that causes COVID-19. Institutional protocols and algorithms that pertain to the evaluation of patients at risk for COVID-19 are in a state of rapid change based on information released by regulatory bodies including the CDC  and federal and state organizations. These policies and algorithms were followed during the patient's care in the ED.  Patient's labs are essentially normal.  Review of the old records and discussing the patient with Dr. Bary Leriche nurse show that this memory problem has been progressive for some time.  The patient does not have any fever or anything else to suggest infection.  Lab work chest x-ray and CT of the head have not showed an etiology for her memory loss.  Patient has been refusing any further care.  She has been offered assisted living several times and declined.     ____________________________________________   FINAL CLINICAL IMPRESSION(S) / ED DIAGNOSES  Final diagnoses:  Noninfected skin tear of right lower extremity, initial encounter     ED Discharge Orders    None       Note:  This document was prepared using Dragon voice recognition software and may include unintentional dictation errors.    Nena Polio, MD 04/11/19 [phone]

## 2019-04-12 DIAGNOSIS — K2270 Barrett's esophagus without dysplasia: Secondary | ICD-10-CM | POA: Diagnosis not present

## 2019-04-12 DIAGNOSIS — J410 Simple chronic bronchitis: Secondary | ICD-10-CM | POA: Diagnosis not present

## 2019-04-12 DIAGNOSIS — H409 Unspecified glaucoma: Secondary | ICD-10-CM | POA: Diagnosis not present

## 2019-04-12 DIAGNOSIS — S81811D Laceration without foreign body, right lower leg, subsequent encounter: Secondary | ICD-10-CM | POA: Diagnosis not present

## 2019-04-12 DIAGNOSIS — K209 Esophagitis, unspecified: Secondary | ICD-10-CM | POA: Diagnosis not present

## 2019-04-12 DIAGNOSIS — F339 Major depressive disorder, recurrent, unspecified: Secondary | ICD-10-CM | POA: Diagnosis not present

## 2019-04-15 DIAGNOSIS — H10232 Serous conjunctivitis, except viral, left eye: Secondary | ICD-10-CM | POA: Diagnosis not present

## 2019-04-15 DIAGNOSIS — H18232 Secondary corneal edema, left eye: Secondary | ICD-10-CM | POA: Diagnosis not present

## 2019-04-15 DIAGNOSIS — H353132 Nonexudative age-related macular degeneration, bilateral, intermediate dry stage: Secondary | ICD-10-CM | POA: Diagnosis not present

## 2019-04-15 DIAGNOSIS — H903 Sensorineural hearing loss, bilateral: Secondary | ICD-10-CM | POA: Diagnosis not present

## 2019-04-15 DIAGNOSIS — H401433 Capsular glaucoma with pseudoexfoliation of lens, bilateral, severe stage: Secondary | ICD-10-CM | POA: Diagnosis not present

## 2019-04-15 DIAGNOSIS — H6123 Impacted cerumen, bilateral: Secondary | ICD-10-CM | POA: Diagnosis not present

## 2019-04-16 DIAGNOSIS — J410 Simple chronic bronchitis: Secondary | ICD-10-CM | POA: Diagnosis not present

## 2019-04-16 DIAGNOSIS — K209 Esophagitis, unspecified: Secondary | ICD-10-CM | POA: Diagnosis not present

## 2019-04-16 DIAGNOSIS — F339 Major depressive disorder, recurrent, unspecified: Secondary | ICD-10-CM | POA: Diagnosis not present

## 2019-04-16 DIAGNOSIS — H409 Unspecified glaucoma: Secondary | ICD-10-CM | POA: Diagnosis not present

## 2019-04-16 DIAGNOSIS — S81811D Laceration without foreign body, right lower leg, subsequent encounter: Secondary | ICD-10-CM | POA: Diagnosis not present

## 2019-04-16 DIAGNOSIS — K2270 Barrett's esophagus without dysplasia: Secondary | ICD-10-CM | POA: Diagnosis not present

## 2019-04-19 DIAGNOSIS — H409 Unspecified glaucoma: Secondary | ICD-10-CM | POA: Diagnosis not present

## 2019-04-19 DIAGNOSIS — K2270 Barrett's esophagus without dysplasia: Secondary | ICD-10-CM | POA: Diagnosis not present

## 2019-04-19 DIAGNOSIS — J410 Simple chronic bronchitis: Secondary | ICD-10-CM | POA: Diagnosis not present

## 2019-04-19 DIAGNOSIS — K209 Esophagitis, unspecified: Secondary | ICD-10-CM | POA: Diagnosis not present

## 2019-04-19 DIAGNOSIS — F339 Major depressive disorder, recurrent, unspecified: Secondary | ICD-10-CM | POA: Diagnosis not present

## 2019-04-19 DIAGNOSIS — S81811D Laceration without foreign body, right lower leg, subsequent encounter: Secondary | ICD-10-CM | POA: Diagnosis not present

## 2019-04-22 DIAGNOSIS — J410 Simple chronic bronchitis: Secondary | ICD-10-CM | POA: Diagnosis not present

## 2019-04-22 DIAGNOSIS — F339 Major depressive disorder, recurrent, unspecified: Secondary | ICD-10-CM | POA: Diagnosis not present

## 2019-04-22 DIAGNOSIS — K209 Esophagitis, unspecified: Secondary | ICD-10-CM | POA: Diagnosis not present

## 2019-04-22 DIAGNOSIS — S81811D Laceration without foreign body, right lower leg, subsequent encounter: Secondary | ICD-10-CM | POA: Diagnosis not present

## 2019-04-22 DIAGNOSIS — K2270 Barrett's esophagus without dysplasia: Secondary | ICD-10-CM | POA: Diagnosis not present

## 2019-04-22 DIAGNOSIS — H409 Unspecified glaucoma: Secondary | ICD-10-CM | POA: Diagnosis not present

## 2019-04-23 DIAGNOSIS — C44729 Squamous cell carcinoma of skin of left lower limb, including hip: Secondary | ICD-10-CM | POA: Diagnosis not present

## 2019-04-23 DIAGNOSIS — D485 Neoplasm of uncertain behavior of skin: Secondary | ICD-10-CM | POA: Diagnosis not present

## 2019-04-23 DIAGNOSIS — S81811D Laceration without foreign body, right lower leg, subsequent encounter: Secondary | ICD-10-CM | POA: Diagnosis not present

## 2019-04-23 DIAGNOSIS — H409 Unspecified glaucoma: Secondary | ICD-10-CM | POA: Diagnosis not present

## 2019-04-23 DIAGNOSIS — J410 Simple chronic bronchitis: Secondary | ICD-10-CM | POA: Diagnosis not present

## 2019-04-23 DIAGNOSIS — K209 Esophagitis, unspecified: Secondary | ICD-10-CM | POA: Diagnosis not present

## 2019-04-23 DIAGNOSIS — F339 Major depressive disorder, recurrent, unspecified: Secondary | ICD-10-CM | POA: Diagnosis not present

## 2019-04-23 DIAGNOSIS — K2270 Barrett's esophagus without dysplasia: Secondary | ICD-10-CM | POA: Diagnosis not present

## 2019-04-24 DIAGNOSIS — K2270 Barrett's esophagus without dysplasia: Secondary | ICD-10-CM | POA: Diagnosis not present

## 2019-04-24 DIAGNOSIS — K209 Esophagitis, unspecified: Secondary | ICD-10-CM | POA: Diagnosis not present

## 2019-04-24 DIAGNOSIS — J410 Simple chronic bronchitis: Secondary | ICD-10-CM | POA: Diagnosis not present

## 2019-04-24 DIAGNOSIS — H409 Unspecified glaucoma: Secondary | ICD-10-CM | POA: Diagnosis not present

## 2019-04-24 DIAGNOSIS — F339 Major depressive disorder, recurrent, unspecified: Secondary | ICD-10-CM | POA: Diagnosis not present

## 2019-04-24 DIAGNOSIS — S81811D Laceration without foreign body, right lower leg, subsequent encounter: Secondary | ICD-10-CM | POA: Diagnosis not present

## 2019-04-26 DIAGNOSIS — F339 Major depressive disorder, recurrent, unspecified: Secondary | ICD-10-CM | POA: Diagnosis not present

## 2019-04-26 DIAGNOSIS — J410 Simple chronic bronchitis: Secondary | ICD-10-CM | POA: Diagnosis not present

## 2019-04-26 DIAGNOSIS — H409 Unspecified glaucoma: Secondary | ICD-10-CM | POA: Diagnosis not present

## 2019-04-26 DIAGNOSIS — K2270 Barrett's esophagus without dysplasia: Secondary | ICD-10-CM | POA: Diagnosis not present

## 2019-04-26 DIAGNOSIS — K209 Esophagitis, unspecified: Secondary | ICD-10-CM | POA: Diagnosis not present

## 2019-04-26 DIAGNOSIS — S81811D Laceration without foreign body, right lower leg, subsequent encounter: Secondary | ICD-10-CM | POA: Diagnosis not present

## 2019-04-29 DIAGNOSIS — K209 Esophagitis, unspecified: Secondary | ICD-10-CM | POA: Diagnosis not present

## 2019-04-29 DIAGNOSIS — H409 Unspecified glaucoma: Secondary | ICD-10-CM | POA: Diagnosis not present

## 2019-04-29 DIAGNOSIS — J410 Simple chronic bronchitis: Secondary | ICD-10-CM | POA: Diagnosis not present

## 2019-04-29 DIAGNOSIS — F339 Major depressive disorder, recurrent, unspecified: Secondary | ICD-10-CM | POA: Diagnosis not present

## 2019-04-29 DIAGNOSIS — S81811D Laceration without foreign body, right lower leg, subsequent encounter: Secondary | ICD-10-CM | POA: Diagnosis not present

## 2019-04-29 DIAGNOSIS — K2270 Barrett's esophagus without dysplasia: Secondary | ICD-10-CM | POA: Diagnosis not present

## 2019-04-30 DIAGNOSIS — J410 Simple chronic bronchitis: Secondary | ICD-10-CM | POA: Diagnosis not present

## 2019-04-30 DIAGNOSIS — S81811D Laceration without foreign body, right lower leg, subsequent encounter: Secondary | ICD-10-CM | POA: Diagnosis not present

## 2019-04-30 DIAGNOSIS — K209 Esophagitis, unspecified: Secondary | ICD-10-CM | POA: Diagnosis not present

## 2019-04-30 DIAGNOSIS — K2270 Barrett's esophagus without dysplasia: Secondary | ICD-10-CM | POA: Diagnosis not present

## 2019-04-30 DIAGNOSIS — H409 Unspecified glaucoma: Secondary | ICD-10-CM | POA: Diagnosis not present

## 2019-04-30 DIAGNOSIS — F339 Major depressive disorder, recurrent, unspecified: Secondary | ICD-10-CM | POA: Diagnosis not present

## 2019-05-01 DIAGNOSIS — H10232 Serous conjunctivitis, except viral, left eye: Secondary | ICD-10-CM | POA: Diagnosis not present

## 2019-05-01 DIAGNOSIS — H401433 Capsular glaucoma with pseudoexfoliation of lens, bilateral, severe stage: Secondary | ICD-10-CM | POA: Diagnosis not present

## 2019-05-01 DIAGNOSIS — H353132 Nonexudative age-related macular degeneration, bilateral, intermediate dry stage: Secondary | ICD-10-CM | POA: Diagnosis not present

## 2019-05-01 DIAGNOSIS — H18232 Secondary corneal edema, left eye: Secondary | ICD-10-CM | POA: Diagnosis not present

## 2019-05-02 DIAGNOSIS — K2270 Barrett's esophagus without dysplasia: Secondary | ICD-10-CM | POA: Diagnosis not present

## 2019-05-02 DIAGNOSIS — J410 Simple chronic bronchitis: Secondary | ICD-10-CM | POA: Diagnosis not present

## 2019-05-02 DIAGNOSIS — K209 Esophagitis, unspecified: Secondary | ICD-10-CM | POA: Diagnosis not present

## 2019-05-02 DIAGNOSIS — S81811D Laceration without foreign body, right lower leg, subsequent encounter: Secondary | ICD-10-CM | POA: Diagnosis not present

## 2019-05-02 DIAGNOSIS — H409 Unspecified glaucoma: Secondary | ICD-10-CM | POA: Diagnosis not present

## 2019-05-02 DIAGNOSIS — F339 Major depressive disorder, recurrent, unspecified: Secondary | ICD-10-CM | POA: Diagnosis not present

## 2019-05-03 DIAGNOSIS — K2270 Barrett's esophagus without dysplasia: Secondary | ICD-10-CM | POA: Diagnosis not present

## 2019-05-03 DIAGNOSIS — H409 Unspecified glaucoma: Secondary | ICD-10-CM | POA: Diagnosis not present

## 2019-05-03 DIAGNOSIS — G4733 Obstructive sleep apnea (adult) (pediatric): Secondary | ICD-10-CM | POA: Diagnosis not present

## 2019-05-03 DIAGNOSIS — K209 Esophagitis, unspecified without bleeding: Secondary | ICD-10-CM | POA: Diagnosis not present

## 2019-05-03 DIAGNOSIS — Z9989 Dependence on other enabling machines and devices: Secondary | ICD-10-CM | POA: Diagnosis not present

## 2019-05-03 DIAGNOSIS — S81811D Laceration without foreign body, right lower leg, subsequent encounter: Secondary | ICD-10-CM | POA: Diagnosis not present

## 2019-05-03 DIAGNOSIS — J410 Simple chronic bronchitis: Secondary | ICD-10-CM | POA: Diagnosis not present

## 2019-05-03 DIAGNOSIS — M810 Age-related osteoporosis without current pathological fracture: Secondary | ICD-10-CM | POA: Diagnosis not present

## 2019-05-03 DIAGNOSIS — M6281 Muscle weakness (generalized): Secondary | ICD-10-CM | POA: Diagnosis not present

## 2019-05-03 DIAGNOSIS — R262 Difficulty in walking, not elsewhere classified: Secondary | ICD-10-CM | POA: Diagnosis not present

## 2019-05-03 DIAGNOSIS — F339 Major depressive disorder, recurrent, unspecified: Secondary | ICD-10-CM | POA: Diagnosis not present

## 2019-05-04 ENCOUNTER — Encounter: Payer: Self-pay | Admitting: Emergency Medicine

## 2019-05-04 ENCOUNTER — Emergency Department
Admission: EM | Admit: 2019-05-04 | Discharge: 2019-05-04 | Disposition: A | Discharge status: Home / Self Care | Payer: Medicare Other | Attending: Emergency Medicine | Admitting: Emergency Medicine

## 2019-05-04 ENCOUNTER — Other Ambulatory Visit: Payer: Self-pay

## 2019-05-04 DIAGNOSIS — Z5189 Encounter for other specified aftercare: Secondary | ICD-10-CM

## 2019-05-04 DIAGNOSIS — I10 Essential (primary) hypertension: Secondary | ICD-10-CM | POA: Diagnosis not present

## 2019-05-04 DIAGNOSIS — X58XXXD Exposure to other specified factors, subsequent encounter: Secondary | ICD-10-CM | POA: Diagnosis not present

## 2019-05-04 DIAGNOSIS — S81811D Laceration without foreign body, right lower leg, subsequent encounter: Secondary | ICD-10-CM | POA: Diagnosis not present

## 2019-05-04 DIAGNOSIS — Z4802 Encounter for removal of sutures: Secondary | ICD-10-CM | POA: Diagnosis not present

## 2019-05-04 DIAGNOSIS — R52 Pain, unspecified: Secondary | ICD-10-CM | POA: Diagnosis not present

## 2019-05-04 DIAGNOSIS — Z8673 Personal history of transient ischemic attack (TIA), and cerebral infarction without residual deficits: Secondary | ICD-10-CM | POA: Diagnosis not present

## 2019-05-04 NOTE — ED Triage Notes (Signed)
States lives by herself and called ambulance today to have hew leg wounds checked. States she has bilateral shin wounds for over a week from bumping into furniture. States had home health coming to check her but they have not been there to change dressings. Dry gauze wraps noted with no bleeding or oozing noted.

## 2019-05-04 NOTE — ED Provider Notes (Signed)
[facility] Emergency Department Provider Note   ____________________________________________   First MD Initiated Contact with Patient 05/04/19 1143     (approximate)  I have reviewed the triage vital signs and the nursing notes.   HISTORY  Chief Complaint Wound Check    HPI Christy Cisneros is a 85 y.o. female patient arrived via EMS for dressing change of the right lower leg.  Patient states home nurse care was supposed to come to her house to do dressing change with EMS showed up.  Patient is advised self and states she continually bumped into things in the house.  Patient has poor hearing but vision is good with corrective lenses.  Patient state family members come occasionally to her house but no recent visit since 10 September.  Patient denies pain at this time.     Past Medical History:  Diagnosis Date  . Barrett's esophagus with esophagitis   . Chronic bronchitis ([place])   . Depression   . GERD (gastroesophageal reflux disease)   . Glaucoma   . Hypercholesteremia   . Osteoarthritis   . Osteoporosis   . Sleep apnea    has CPAP  . TIA (transient ischemic attack)     Patient Active Problem List   Diagnosis Date Noted  . Weight loss 03/25/2019  . Pain due to onychomycosis of toenails of both feet 03/11/2019  . Depression 05/06/2018  . Abrasion 02/28/2018  . History of CVA (cerebrovascular accident) 03/16/2017  . Memory change 09/04/2016  . Pneumonia 07/17/2016  . Mild cognitive impairment 05/15/2016  . Dizziness 04/21/2016  . Neck pain 02/11/2016  . Muscle cramps 11/14/2015  . Urine incontinence 08/16/2015  . Hematoma 08/16/2015  . Weakness 07/12/2015  . Headache 06/16/2015  . Unsteady gait 06/01/2015  . GERD (gastroesophageal reflux disease) 08/11/2014  . DYSPNEA 04/17/2009  . HYPERCHOLESTEROLEMIA 04/16/2009  . Obstructive sleep apnea 04/16/2009  . Glaucoma 04/16/2009  . Transient cerebral ischemia 04/16/2009  . BARRETTS  ESOPHAGUS 04/16/2009  . OSTEOARTHRITIS 04/16/2009  . Osteoporosis 04/16/2009    Past Surgical History:  Procedure Laterality Date  . BUNIONECTOMY     left  . ROTATOR CUFF REPAIR     right    Prior to Admission medications   Medication Sig Start Date End Date Taking? Authorizing Provider  acetaminophen (TYLENOL) 325 MG tablet Take 2 tablets (650 mg total) by mouth every 8 (eight) hours as needed for mild pain. 07/10/18   Jodelle Green, FNP  doxycycline (VIBRA-TABS) 100 MG tablet Take 1 tablet (100 mg total) by mouth 2 (two) times daily. 04/02/19   Jodelle Green, FNP  nystatin cream (MYCOSTATIN) Apply 1 application topically 2 (two) times daily. Under the right breast 08/22/18   Einar Pheasant, MD  omeprazole (PRILOSEC) 20 MG capsule TAKE ONE CAPSULE TWICE A DAY BEFORE MEALS 11/17/16   Einar Pheasant, MD  tobramycin (TOBREX) 0.3 % ophthalmic solution  11/02/17   [provider]    Allergies Alphagan [brimonidine], Avelox [moxifloxacin hcl in nacl], Bacitracin, Cefdinir, Cephalexin, Clarithromycin, Codeine, Erythromycin, Hydrocodone-acetaminophen, Lansoprazole, Polymyxin b, and Vigamox [moxifloxacin]  Family History  Problem Relation Age of Onset  . Breast cancer Sister   . Heart disease Mother   . Alcohol abuse Father     Social History Social History   Tobacco Use  . Smoking status: Never Smoker  . Smokeless tobacco: Never Used  Substance Use Topics  . Alcohol use: Yes    Alcohol/week: 0.0 standard drinks  Comment: wine/brandy occasional  . Drug use: No    Review of Systems Constitutional: No fever/chills Eyes: No visual changes. ENT: No sore throat. Cardiovascular: Denies chest pain. Respiratory: Denies shortness of breath. Gastrointestinal: No abdominal pain.  No nausea, no vomiting.  No diarrhea.  No constipation. Genitourinary: Negative for dysuria. Musculoskeletal: Negative for back pain. Skin: Negative for rash.  Healing skin tears right lower  extremity. Neurological: Negative for headaches, focal weakness or numbness. Psychiatric:  Depression Endocrine:  Hyperlipidemia Allergic/Immunilogical: See extensive allergy list. ____________________________________________   PHYSICAL EXAM:  VITAL SIGNS: ED Triage Vitals  Enc Vitals Group     BP 05/04/19 1054 124/81     Pulse Rate 05/04/19 1054 83     Resp 05/04/19 1054 16     Temp 05/04/19 1054 98.4 F (36.9 C)     Temp Source 05/04/19 1054 Oral     SpO2 05/04/19 1054 97 %     Weight 05/04/19 1101 110 lb (49.9 kg)     Height --      Head Circumference --      Peak Flow --      Pain Score 05/04/19 1101 0     Pain Loc --      Pain Edu? --      Excl. in [place]? --    Constitutional: Alert and oriented. Well appearing and in no acute distress. Eyes: Conjunctivae are normal. PERRL. EOMI. Cardiovascular: Normal rate, regular rhythm. Grossly normal heart sounds.  Good peripheral circulation. Respiratory: Normal respiratory effort.  No retractions. Lungs CTAB. Gastrointestinal: Soft and nontender. No distention. No abdominal bruits. No CVA tenderness. Musculoskeletal: No lower extremity tenderness nor edema.  No joint effusions. Neurologic:  Normal speech and language. No gross focal neurologic deficits are appreciated. No gait instability. Skin: Healing skin tear right lower leg.  No edema or erythema. Psychiatric: Mood and affect are normal. Speech and behavior are normal.  ____________________________________________   LABS (all labs ordered are listed, but only abnormal results are displayed)  Labs Reviewed - No data to display ____________________________________________  EKG   ____________________________________________  RADIOLOGY  ED MD interpretation:    Official radiology report(s): No results found.  ____________________________________________   PROCEDURES  Procedure(s) performed (including Critical Care):  Procedures    ____________________________________________   INITIAL IMPRESSION / ASSESSMENT AND PLAN / ED COURSE  As part of my medical decision making, I reviewed the following data within the [place] was evaluated in Emergency Department on 05/04/2019 for the symptoms described in the history of present illness. She was evaluated in the context of the global COVID-19 pandemic, which necessitated consideration that the patient might be at risk for infection with the SARS-CoV-2 virus that causes COVID-19. Institutional protocols and algorithms that pertain to the evaluation of patients at risk for COVID-19 are in a state of rapid change based on information released by regulatory bodies including the CDC and federal and state organizations. These policies and algorithms were followed during the patient's care in the ED.  Patient presents with dressing change secondary to healing skin tears right lower extremity.  Area was cleaned and re-bandaged.  Patient given discharge care instruction advised to contact family doctor in 2 days to schedule routine dressing change until healing is complete.      ____________________________________________   FINAL CLINICAL IMPRESSION(S) / ED DIAGNOSES  Final diagnoses:  Visit for wound check     ED Discharge  Orders    None       Note:  This document was prepared using Dragon voice recognition software and may include unintentional dictation errors.    Sable Feil, PA-C 05/04/19 1156    Arta Silence, MD 05/04/19 [phone]

## 2019-05-04 NOTE — ED Notes (Signed)
First Nurse Note: Pt to ED via [facility] from home for wound check. Per EMS wound care nurse came out last week and wrapped pts legs and per patient never came back. Pts legs are weeping and painful. Pt is in NAD.

## 2019-05-06 ENCOUNTER — Other Ambulatory Visit: Payer: Self-pay

## 2019-05-06 ENCOUNTER — Emergency Department
Admission: EM | Admit: 2019-05-06 | Discharge: 2019-05-06 | Disposition: A | Discharge status: Home / Self Care | Payer: Medicare Other | Attending: Emergency Medicine | Admitting: Emergency Medicine

## 2019-05-06 DIAGNOSIS — X58XXXD Exposure to other specified factors, subsequent encounter: Secondary | ICD-10-CM | POA: Diagnosis not present

## 2019-05-06 DIAGNOSIS — S81802D Unspecified open wound, left lower leg, subsequent encounter: Secondary | ICD-10-CM | POA: Insufficient documentation

## 2019-05-06 DIAGNOSIS — Z5189 Encounter for other specified aftercare: Secondary | ICD-10-CM

## 2019-05-06 DIAGNOSIS — Z8673 Personal history of transient ischemic attack (TIA), and cerebral infarction without residual deficits: Secondary | ICD-10-CM | POA: Insufficient documentation

## 2019-05-06 DIAGNOSIS — S81801D Unspecified open wound, right lower leg, subsequent encounter: Secondary | ICD-10-CM | POA: Diagnosis not present

## 2019-05-06 DIAGNOSIS — S81811D Laceration without foreign body, right lower leg, subsequent encounter: Secondary | ICD-10-CM | POA: Diagnosis not present

## 2019-05-06 DIAGNOSIS — Z4800 Encounter for change or removal of nonsurgical wound dressing: Secondary | ICD-10-CM | POA: Insufficient documentation

## 2019-05-06 NOTE — ED Notes (Signed)
Xeroform applied to pt's sores on bilateral legs. Pt's legs wrapped with gauze and pt instructed on care.

## 2019-05-06 NOTE — ED Provider Notes (Signed)
[facility] Emergency Department Provider Note  Time seen: 3:54 PM  I have reviewed the triage vital signs and the nursing notes.   HISTORY  Chief Complaint Wound check   HPI Christy Cisneros is a 85 y.o. female with a past medical history of depression, gastric reflux, presents to the emergency department for a wound check.  According to the patient she has 2 small wounds in her lower extremities.  States she came to the emergency department 2 days ago and had the wounds checked at that time.  Was told a home health nurse would come to her home in 2 days to change the dressings once again.  However the patient states no one ever showed up today so she came back to the emergency department to have the wounds addressed.  Patient denies any other symptoms.  No fever.  Largely negative review of systems.  Patient was brought to the emergency department by her neighbor.   Past Medical History:  Diagnosis Date  . Barrett's esophagus with esophagitis   . Chronic bronchitis ([place])   . Depression   . GERD (gastroesophageal reflux disease)   . Glaucoma   . Hypercholesteremia   . Osteoarthritis   . Osteoporosis   . Sleep apnea    has CPAP  . TIA (transient ischemic attack)     Patient Active Problem List   Diagnosis Date Noted  . Weight loss 03/25/2019  . Pain due to onychomycosis of toenails of both feet 03/11/2019  . Depression 05/06/2018  . Abrasion 02/28/2018  . History of CVA (cerebrovascular accident) 03/16/2017  . Memory change 09/04/2016  . Pneumonia 07/17/2016  . Mild cognitive impairment 05/15/2016  . Dizziness 04/21/2016  . Neck pain 02/11/2016  . Muscle cramps 11/14/2015  . Urine incontinence 08/16/2015  . Hematoma 08/16/2015  . Weakness 07/12/2015  . Headache 06/16/2015  . Unsteady gait 06/01/2015  . GERD (gastroesophageal reflux disease) 08/11/2014  . DYSPNEA 04/17/2009  . HYPERCHOLESTEROLEMIA 04/16/2009  . Obstructive sleep apnea  04/16/2009  . Glaucoma 04/16/2009  . Transient cerebral ischemia 04/16/2009  . BARRETTS ESOPHAGUS 04/16/2009  . OSTEOARTHRITIS 04/16/2009  . Osteoporosis 04/16/2009    Past Surgical History:  Procedure Laterality Date  . BUNIONECTOMY     left  . ROTATOR CUFF REPAIR     right    Prior to Admission medications   Medication Sig Start Date End Date Taking? Authorizing Provider  acetaminophen (TYLENOL) 325 MG tablet Take 2 tablets (650 mg total) by mouth every 8 (eight) hours as needed for mild pain. 07/10/18   Jodelle Green, FNP  doxycycline (VIBRA-TABS) 100 MG tablet Take 1 tablet (100 mg total) by mouth 2 (two) times daily. 04/02/19   Jodelle Green, FNP  nystatin cream (MYCOSTATIN) Apply 1 application topically 2 (two) times daily. Under the right breast 08/22/18   Einar Pheasant, MD  omeprazole (PRILOSEC) 20 MG capsule TAKE ONE CAPSULE TWICE A DAY BEFORE MEALS 11/17/16   Einar Pheasant, MD  tobramycin (TOBREX) 0.3 % ophthalmic solution  11/02/17   [provider]    Allergies  Allergen Reactions  . Alphagan [Brimonidine]     Burning, stinging, bloody eye, dry mouth  . Avelox [Moxifloxacin Hcl In Nacl] Other (See Comments)    Dizziness, weakness  . Bacitracin     Tearing, itching, swelling, redness  . Cefdinir Nausea And Vomiting  . Cephalexin     HA, nausea, gas, abdominal pain  . Clarithromycin  HA, nausea, gas, abdominal pain  . Codeine     HA, nausea, gas, abdominal pain  . Erythromycin     HA, nausea, gas, abdominal pain  . Hydrocodone-Acetaminophen     REACTION: chest pain, constipation, nausea  . Lansoprazole     REACTION: cramps, nausea, diarrhea  . Polymyxin B     Itching and redness  . Vigamox [Moxifloxacin] Itching    Soreness, irritability, loss of appetite,  Dizziness, diarrhea, muscle weakness    Family History  Problem Relation Age of Onset  . Breast cancer Sister   . Heart disease Mother   . Alcohol abuse Father     Social  History Social History   Tobacco Use  . Smoking status: Never Smoker  . Smokeless tobacco: Never Used  Substance Use Topics  . Alcohol use: Yes    Alcohol/week: 0.0 standard drinks    Comment: wine/brandy occasional  . Drug use: No    Review of Systems Constitutional: Negative for fever. Respiratory: Negative for shortness of breath. Gastrointestinal: Negative for abdominal pain Genitourinary: Negative for urinary compaints Musculoskeletal: 2 small wounds to her legs. Skin: Small wound to right and left lower extremities. Neurological: Negative for headache All other ROS negative  ____________________________________________   PHYSICAL EXAM:  VITAL SIGNS: ED Triage Vitals  Enc Vitals Group     BP 05/06/19 1527 (!) 143/98     Pulse Rate 05/06/19 1527 80     Resp 05/06/19 1527 18     Temp 05/06/19 1527 98.1 F (36.7 C)     Temp Source 05/06/19 1527 Oral     SpO2 05/06/19 1527 97 %     Weight --      Height --      Head Circumference --      Peak Flow --      Pain Score 05/06/19 1528 8     Pain Loc --      Pain Edu? --      Excl. in [place]? --    Constitutional: Alert and oriented. Well appearing and in no distress. Eyes: Normal exam ENT      Head: Normocephalic and atraumatic.      Mouth/Throat: Mucous membranes are moist. Cardiovascular: Normal rate, regular rhythm. Respiratory: Normal respiratory effort without tachypnea nor retractions. Breath sounds are clear Gastrointestinal: Soft and nontender. No distention.  Musculoskeletal: Nontender with normal range of motion in all extremities. Neurologic:  Normal speech and language. No gross focal neurologic deficits  Skin: 2 small wounds to her lower extremities one on each lower extremity approximately 1 x 2 cm in the left lower extremity and 2 x 3 cm the right lower extremity.  Overall well-appearing no signs of cellulitis. Psychiatric: Mood and affect are normal.    ____________________________________________   INITIAL IMPRESSION / ASSESSMENT AND PLAN / ED COURSE  Pertinent labs & imaging results that were available during my care of the patient were reviewed by me and considered in my medical decision making (see chart for details).   Patient presents to the emergency department for wounds to her bilateral lower extremities.  States she was supposed to have someone come to the house to change the dressings today but no one showed up so she came back to the emergency department.  Overall the patient appears extremely well.  We will change the dressing in the emergency department.  Wound is minimal no signs of cellulitis or significant infection.  We will discuss with social work and have them  speak to the patient to help arrange for the home health nurse visit.  JAILEN WHITON was evaluated in Emergency Department on 05/06/2019 for the symptoms described in the history of present illness. She was evaluated in the context of the global COVID-19 pandemic, which necessitated consideration that the patient might be at risk for infection with the SARS-CoV-2 virus that causes COVID-19. Institutional protocols and algorithms that pertain to the evaluation of patients at risk for COVID-19 are in a state of rapid change based on information released by regulatory bodies including the CDC and federal and state organizations. These policies and algorithms were followed during the patient's care in the ED.  ____________________________________________   FINAL CLINICAL IMPRESSION(S) / ED DIAGNOSES  Wound check Dressing change   Harvest Dark, MD 05/06/19 1557

## 2019-05-06 NOTE — ED Triage Notes (Signed)
t to the er for bilateral leg swelling with wounds. Pt was seen here a week ago and a home health bnurse was to come and change her dressings and no one has come to change them. Pt states she needs her dressings changed. Pt is ambulatory, A&O x 4 and lives alone.

## 2019-05-06 NOTE — ED Notes (Signed)
Pt has bilateral leg swelling. Pt has some scabbed up sores on bilateral lower extremities. No bleeding or drainage noted at this time. CSW at bedside with pt and family at this time.

## 2019-05-06 NOTE — TOC Initial Note (Signed)
Transition of Care [facility]) - Initial/Assessment Note    Patient Details  Name: Christy Cisneros MRN: [id number] Date of Birth: 07/16/1920  Transition of Care [facility]) CM/SW Contact:    Fredric Mare, LCSW Phone Number: 05/06/2019, 4:14 PM  Clinical Narrative:                 Patient is a 85 year old female that presents to the ED for leg swelling. Patient currently uses a cane to get around her apartment and currently lives a lone. Patietn stated that she has someone that comes to bring her food, and someone else that cleans her apartment. Patient's neighbor was at bedside. Patient shared that she was supposed to have a home health nurse to help her dress her wounds, but no one has come out to see her yet. Patient shared that she would like to set up home health again.  CSW provided patient with a list of [place] agencies. Patient chose [facility] for RN and home health aide. Jason with [facility] was notified.  EDP was notified to put in orders.       Expected Discharge Plan: [place]     Patient Goals and CMS Choice   CMS Medicare.gov Compare Post Acute Care list provided to:: Patient Choice offered to / list presented to : Patient  Expected Discharge Plan and Services Expected Discharge Plan: [place]   Discharge Planning Services: CM Consult Post Acute Care Choice: [place] arrangements for the past 2 months: Apartment                           HH Arranged: RN, Nurse's Aide HH Agency: [place] ([facility]) Date HH Agency Contacted: 05/06/19 Time HH Agency Contacted: [phone]    Prior Living Arrangements/Services Living arrangements for the past 2 months: Apartment Lives with:: Self Patient language and need for interpreter reviewed:: Yes Do you feel safe going back to the place where you live?: Yes      Need for Family Participation in Patient Care: No (Comment) Care giver support system in place?: Yes  (comment) Current home services: DME(cane) Criminal Activity/Legal Involvement Pertinent to Current Situation/Hospitalization: No - Comment as needed  Activities of Daily Living      Permission Sought/Granted                  Emotional Assessment Appearance:: Appears stated age Attitude/Demeanor/Rapport: Self-Confident, Engaged, Charismatic Affect (typically observed): Accepting, Appropriate, Happy Orientation: : Oriented to Self, Oriented to Place, Oriented to  Time, Oriented to Situation Alcohol / Substance Use: Never Used Psych Involvement: No (comment)  Admission diagnosis:  Legs swelling Patient Active Problem List   Diagnosis Date Noted  . Weight loss 03/25/2019  . Pain due to onychomycosis of toenails of both feet 03/11/2019  . Depression 05/06/2018  . Abrasion 02/28/2018  . History of CVA (cerebrovascular accident) 03/16/2017  . Memory change 09/04/2016  . Pneumonia 07/17/2016  . Mild cognitive impairment 05/15/2016  . Dizziness 04/21/2016  . Neck pain 02/11/2016  . Muscle cramps 11/14/2015  . Urine incontinence 08/16/2015  . Hematoma 08/16/2015  . Weakness 07/12/2015  . Headache 06/16/2015  . Unsteady gait 06/01/2015  . GERD (gastroesophageal reflux disease) 08/11/2014  . DYSPNEA 04/17/2009  . HYPERCHOLESTEROLEMIA 04/16/2009  . Obstructive sleep apnea 04/16/2009  . Glaucoma 04/16/2009  . Transient cerebral ischemia 04/16/2009  . BARRETTS ESOPHAGUS 04/16/2009  . OSTEOARTHRITIS  04/16/2009  . Osteoporosis 04/16/2009   PCP:  Einar Pheasant, MD Pharmacy:   [place], [place] - [address] [place] [address] Phone: [phone] Fax: [phone]     Social Determinants of Health (SDOH) Interventions    Readmission Risk Interventions No flowsheet data found.

## 2019-05-07 ENCOUNTER — Telehealth: Payer: Self-pay | Admitting: Internal Medicine

## 2019-05-07 DIAGNOSIS — K209 Esophagitis, unspecified without bleeding: Secondary | ICD-10-CM | POA: Diagnosis not present

## 2019-05-07 DIAGNOSIS — J410 Simple chronic bronchitis: Secondary | ICD-10-CM | POA: Diagnosis not present

## 2019-05-07 DIAGNOSIS — S81811D Laceration without foreign body, right lower leg, subsequent encounter: Secondary | ICD-10-CM | POA: Diagnosis not present

## 2019-05-07 DIAGNOSIS — F339 Major depressive disorder, recurrent, unspecified: Secondary | ICD-10-CM | POA: Diagnosis not present

## 2019-05-07 DIAGNOSIS — H409 Unspecified glaucoma: Secondary | ICD-10-CM | POA: Diagnosis not present

## 2019-05-07 DIAGNOSIS — K2270 Barrett's esophagus without dysplasia: Secondary | ICD-10-CM | POA: Diagnosis not present

## 2019-05-07 NOTE — Telephone Encounter (Signed)
Ok.  Let me know if I need to do something more.

## 2019-05-07 NOTE — Telephone Encounter (Signed)
Patient has been to ER on 05/04/19 and on 05/06/19 for wound care tried to call [facility]  To find out date of last visit and when would they be going back out to home. Left message for Nurse to call office.

## 2019-05-08 ENCOUNTER — Telehealth: Payer: Self-pay

## 2019-05-08 DIAGNOSIS — J410 Simple chronic bronchitis: Secondary | ICD-10-CM | POA: Diagnosis not present

## 2019-05-08 DIAGNOSIS — H409 Unspecified glaucoma: Secondary | ICD-10-CM | POA: Diagnosis not present

## 2019-05-08 DIAGNOSIS — K2270 Barrett's esophagus without dysplasia: Secondary | ICD-10-CM | POA: Diagnosis not present

## 2019-05-08 DIAGNOSIS — K209 Esophagitis, unspecified without bleeding: Secondary | ICD-10-CM | POA: Diagnosis not present

## 2019-05-08 DIAGNOSIS — S81811D Laceration without foreign body, right lower leg, subsequent encounter: Secondary | ICD-10-CM | POA: Diagnosis not present

## 2019-05-08 DIAGNOSIS — F339 Major depressive disorder, recurrent, unspecified: Secondary | ICD-10-CM | POA: Diagnosis not present

## 2019-05-08 NOTE — Telephone Encounter (Signed)
Noted  

## 2019-05-08 NOTE — Telephone Encounter (Signed)
Thank you for the update.  Let me know if I need to do anything.

## 2019-05-08 NOTE — Telephone Encounter (Signed)
Spoke with Ronie from [employer]. She was calling to let you know that she will be handling patients case. They went out today for the first time. She says she was calling per our request to be notified. Advised that you are not in the office this afternoon. Ronie stated if you would like to give her a call tomorrow, her direct number is listed below.

## 2019-05-08 NOTE — Telephone Encounter (Signed)
Copied from [place] [id number]. Topic: General - Other >> May 08, 2019  1:03 PM Leward Quan A wrote: Reason for CRM: Kandee Keen with [facility] called to inform Dr Nicki Reaper that she filed a report with Adult Protective Services about the patient at the [place]. Say if there are any questions she can be reached at Ph# [phone]

## 2019-05-08 NOTE — Telephone Encounter (Signed)
Elizabeth with [facility] called back for Kerin Salen she can be reached at Ph# [phone]

## 2019-05-08 NOTE — Telephone Encounter (Signed)
Copied from [place] [id number]. Topic: General - Inquiry >> May 08, 2019  2:40 PM Mathis Bud wrote: Reason for CRM: Ronie from [place] adult protective services called requesting to speak with Scott,Charlene  Call back # [phone]

## 2019-05-08 NOTE — Telephone Encounter (Signed)
Spoke with [facility], Benjamine Mola stated they have been out twice per week for dressing changes and the patient does not remember them coming,PT and OT are in the home 1 and twice per week, patient does not remember them coming into the home. Patient went to ER on 05/04/19 dressing change was completed on 04/30/19, and next visit was scheduled for 05/06/19 but patient was in ER so [facility] saw patient on 05/07/19. Benjamine Mola says she is not sure patient even remembers to eat anymore, that friends do see her on Sunday and bring food twice per week maybe but patient in her opinion cannot remember to eat. Benjamine Mola says patient is not safe at home , they have been to home and no one could find patient she would be wondering the neighborhood. [facility] has made referral to Adult Protective services stating patient is not safe that she needs 24 hour care. FYI

## 2019-05-09 NOTE — Telephone Encounter (Signed)
Called Roni. Mailbox is full. Sending to Juliann Pulse since I am out of the office tomorrow as well as Dr Nicki Reaper

## 2019-05-09 NOTE — Telephone Encounter (Signed)
Called and discussed with Ronie.  She wanted to inform me she has performed her initial assessment.

## 2019-05-09 NOTE — Telephone Encounter (Signed)
Roni called in to request a call back from [facility]

## 2019-05-12 DIAGNOSIS — N390 Urinary tract infection, site not specified: Secondary | ICD-10-CM | POA: Diagnosis not present

## 2019-05-13 NOTE — Telephone Encounter (Signed)
Roni was calling to see if pt has ever seen neurology or has dx of dementia. She has not received her medical records yet. Also, Roni was wondering if there are any other concerns you were wanting them to be aware of with this pt

## 2019-05-13 NOTE — Telephone Encounter (Signed)
She has seen neurology previously.  Last 06/2017.  At that time was diagnosed with memory loss - mild cognitive impairment.  She has not had wanted to return.  Just want to make sure she is safe and able to take care of herself at home.  Has friends who come and visit.  When social services evaluated previously, had a friend washing her clothes, checking on her, taking her places and checking about food.

## 2019-05-14 DIAGNOSIS — K209 Esophagitis, unspecified without bleeding: Secondary | ICD-10-CM | POA: Diagnosis not present

## 2019-05-14 DIAGNOSIS — H409 Unspecified glaucoma: Secondary | ICD-10-CM | POA: Diagnosis not present

## 2019-05-14 DIAGNOSIS — F339 Major depressive disorder, recurrent, unspecified: Secondary | ICD-10-CM | POA: Diagnosis not present

## 2019-05-14 DIAGNOSIS — K2270 Barrett's esophagus without dysplasia: Secondary | ICD-10-CM | POA: Diagnosis not present

## 2019-05-14 DIAGNOSIS — J410 Simple chronic bronchitis: Secondary | ICD-10-CM | POA: Diagnosis not present

## 2019-05-14 DIAGNOSIS — S81811D Laceration without foreign body, right lower leg, subsequent encounter: Secondary | ICD-10-CM | POA: Diagnosis not present

## 2019-05-15 NOTE — Telephone Encounter (Signed)
Called Roni, unable to leave message

## 2019-05-17 DIAGNOSIS — C44729 Squamous cell carcinoma of skin of left lower limb, including hip: Secondary | ICD-10-CM | POA: Diagnosis not present

## 2019-05-20 DIAGNOSIS — H10232 Serous conjunctivitis, except viral, left eye: Secondary | ICD-10-CM | POA: Diagnosis not present

## 2019-05-20 DIAGNOSIS — H18232 Secondary corneal edema, left eye: Secondary | ICD-10-CM | POA: Diagnosis not present

## 2019-05-20 DIAGNOSIS — H353132 Nonexudative age-related macular degeneration, bilateral, intermediate dry stage: Secondary | ICD-10-CM | POA: Diagnosis not present

## 2019-05-20 DIAGNOSIS — H401433 Capsular glaucoma with pseudoexfoliation of lens, bilateral, severe stage: Secondary | ICD-10-CM | POA: Diagnosis not present

## 2019-05-21 DIAGNOSIS — K209 Esophagitis, unspecified without bleeding: Secondary | ICD-10-CM | POA: Diagnosis not present

## 2019-05-21 DIAGNOSIS — S81811D Laceration without foreign body, right lower leg, subsequent encounter: Secondary | ICD-10-CM | POA: Diagnosis not present

## 2019-05-21 DIAGNOSIS — J410 Simple chronic bronchitis: Secondary | ICD-10-CM | POA: Diagnosis not present

## 2019-05-21 DIAGNOSIS — H409 Unspecified glaucoma: Secondary | ICD-10-CM | POA: Diagnosis not present

## 2019-05-21 DIAGNOSIS — F339 Major depressive disorder, recurrent, unspecified: Secondary | ICD-10-CM | POA: Diagnosis not present

## 2019-05-21 DIAGNOSIS — K2270 Barrett's esophagus without dysplasia: Secondary | ICD-10-CM | POA: Diagnosis not present

## 2019-05-24 DIAGNOSIS — H18239 Secondary corneal edema, unspecified eye: Secondary | ICD-10-CM | POA: Diagnosis not present

## 2019-05-24 DIAGNOSIS — H353132 Nonexudative age-related macular degeneration, bilateral, intermediate dry stage: Secondary | ICD-10-CM | POA: Diagnosis not present

## 2019-05-24 DIAGNOSIS — H10232 Serous conjunctivitis, except viral, left eye: Secondary | ICD-10-CM | POA: Diagnosis not present

## 2019-05-24 DIAGNOSIS — H401433 Capsular glaucoma with pseudoexfoliation of lens, bilateral, severe stage: Secondary | ICD-10-CM | POA: Diagnosis not present

## 2019-05-28 DIAGNOSIS — K209 Esophagitis, unspecified without bleeding: Secondary | ICD-10-CM | POA: Diagnosis not present

## 2019-05-28 DIAGNOSIS — H409 Unspecified glaucoma: Secondary | ICD-10-CM | POA: Diagnosis not present

## 2019-05-28 DIAGNOSIS — S81811D Laceration without foreign body, right lower leg, subsequent encounter: Secondary | ICD-10-CM | POA: Diagnosis not present

## 2019-05-28 DIAGNOSIS — F339 Major depressive disorder, recurrent, unspecified: Secondary | ICD-10-CM | POA: Diagnosis not present

## 2019-05-28 DIAGNOSIS — J410 Simple chronic bronchitis: Secondary | ICD-10-CM | POA: Diagnosis not present

## 2019-05-28 DIAGNOSIS — K2270 Barrett's esophagus without dysplasia: Secondary | ICD-10-CM | POA: Diagnosis not present

## 2019-05-30 ENCOUNTER — Telehealth: Payer: Self-pay

## 2019-05-30 DIAGNOSIS — E559 Vitamin D deficiency, unspecified: Secondary | ICD-10-CM | POA: Diagnosis not present

## 2019-05-30 DIAGNOSIS — M810 Age-related osteoporosis without current pathological fracture: Secondary | ICD-10-CM | POA: Diagnosis not present

## 2019-05-30 NOTE — Telephone Encounter (Signed)
LMTCB. Can not speak with Pamala Hurry. She is not listed on DPR.

## 2019-05-30 NOTE — Telephone Encounter (Signed)
Copied from [place] [id number]. Topic: General - Other >> May 30, 2019  3:21 PM Leward Quan A wrote: Reason for CRM: Patient friend Rhunette Croft called to speak to Dr Nicki Reaper in reference to a complaint made to DSS about the patient and the care that she is receiving or falls they think she may have had. Can be reached at Ph# [phone]

## 2019-06-02 DIAGNOSIS — K2270 Barrett's esophagus without dysplasia: Secondary | ICD-10-CM | POA: Diagnosis not present

## 2019-06-02 DIAGNOSIS — S81811D Laceration without foreign body, right lower leg, subsequent encounter: Secondary | ICD-10-CM | POA: Diagnosis not present

## 2019-06-02 DIAGNOSIS — G4733 Obstructive sleep apnea (adult) (pediatric): Secondary | ICD-10-CM | POA: Diagnosis not present

## 2019-06-02 DIAGNOSIS — R262 Difficulty in walking, not elsewhere classified: Secondary | ICD-10-CM | POA: Diagnosis not present

## 2019-06-02 DIAGNOSIS — H409 Unspecified glaucoma: Secondary | ICD-10-CM | POA: Diagnosis not present

## 2019-06-02 DIAGNOSIS — F339 Major depressive disorder, recurrent, unspecified: Secondary | ICD-10-CM | POA: Diagnosis not present

## 2019-06-02 DIAGNOSIS — S81001D Unspecified open wound, right knee, subsequent encounter: Secondary | ICD-10-CM | POA: Diagnosis not present

## 2019-06-02 DIAGNOSIS — M6281 Muscle weakness (generalized): Secondary | ICD-10-CM | POA: Diagnosis not present

## 2019-06-02 DIAGNOSIS — J410 Simple chronic bronchitis: Secondary | ICD-10-CM | POA: Diagnosis not present

## 2019-06-02 DIAGNOSIS — M810 Age-related osteoporosis without current pathological fracture: Secondary | ICD-10-CM | POA: Diagnosis not present

## 2019-06-02 DIAGNOSIS — Z9989 Dependence on other enabling machines and devices: Secondary | ICD-10-CM | POA: Diagnosis not present

## 2019-06-04 DIAGNOSIS — K2270 Barrett's esophagus without dysplasia: Secondary | ICD-10-CM | POA: Diagnosis not present

## 2019-06-04 DIAGNOSIS — H409 Unspecified glaucoma: Secondary | ICD-10-CM | POA: Diagnosis not present

## 2019-06-04 DIAGNOSIS — S81811D Laceration without foreign body, right lower leg, subsequent encounter: Secondary | ICD-10-CM | POA: Diagnosis not present

## 2019-06-04 DIAGNOSIS — J410 Simple chronic bronchitis: Secondary | ICD-10-CM | POA: Diagnosis not present

## 2019-06-04 DIAGNOSIS — F339 Major depressive disorder, recurrent, unspecified: Secondary | ICD-10-CM | POA: Diagnosis not present

## 2019-06-04 DIAGNOSIS — M810 Age-related osteoporosis without current pathological fracture: Secondary | ICD-10-CM | POA: Diagnosis not present

## 2019-06-11 DIAGNOSIS — F339 Major depressive disorder, recurrent, unspecified: Secondary | ICD-10-CM | POA: Diagnosis not present

## 2019-06-11 DIAGNOSIS — K2270 Barrett's esophagus without dysplasia: Secondary | ICD-10-CM | POA: Diagnosis not present

## 2019-06-11 DIAGNOSIS — M810 Age-related osteoporosis without current pathological fracture: Secondary | ICD-10-CM | POA: Diagnosis not present

## 2019-06-11 DIAGNOSIS — S81811D Laceration without foreign body, right lower leg, subsequent encounter: Secondary | ICD-10-CM | POA: Diagnosis not present

## 2019-06-11 DIAGNOSIS — J410 Simple chronic bronchitis: Secondary | ICD-10-CM | POA: Diagnosis not present

## 2019-06-11 DIAGNOSIS — H409 Unspecified glaucoma: Secondary | ICD-10-CM | POA: Diagnosis not present

## 2019-06-13 DIAGNOSIS — H18232 Secondary corneal edema, left eye: Secondary | ICD-10-CM | POA: Diagnosis not present

## 2019-06-13 DIAGNOSIS — H401433 Capsular glaucoma with pseudoexfoliation of lens, bilateral, severe stage: Secondary | ICD-10-CM | POA: Diagnosis not present

## 2019-06-13 DIAGNOSIS — H10232 Serous conjunctivitis, except viral, left eye: Secondary | ICD-10-CM | POA: Diagnosis not present

## 2019-06-13 DIAGNOSIS — H353132 Nonexudative age-related macular degeneration, bilateral, intermediate dry stage: Secondary | ICD-10-CM | POA: Diagnosis not present

## 2019-06-18 DIAGNOSIS — H409 Unspecified glaucoma: Secondary | ICD-10-CM | POA: Diagnosis not present

## 2019-06-18 DIAGNOSIS — F339 Major depressive disorder, recurrent, unspecified: Secondary | ICD-10-CM | POA: Diagnosis not present

## 2019-06-18 DIAGNOSIS — M810 Age-related osteoporosis without current pathological fracture: Secondary | ICD-10-CM | POA: Diagnosis not present

## 2019-06-18 DIAGNOSIS — S81811D Laceration without foreign body, right lower leg, subsequent encounter: Secondary | ICD-10-CM | POA: Diagnosis not present

## 2019-06-18 DIAGNOSIS — J410 Simple chronic bronchitis: Secondary | ICD-10-CM | POA: Diagnosis not present

## 2019-06-18 DIAGNOSIS — K2270 Barrett's esophagus without dysplasia: Secondary | ICD-10-CM | POA: Diagnosis not present

## 2019-06-24 ENCOUNTER — Other Ambulatory Visit: Payer: Self-pay

## 2019-06-24 DIAGNOSIS — F339 Major depressive disorder, recurrent, unspecified: Secondary | ICD-10-CM | POA: Diagnosis not present

## 2019-06-24 DIAGNOSIS — H409 Unspecified glaucoma: Secondary | ICD-10-CM | POA: Diagnosis not present

## 2019-06-24 DIAGNOSIS — S81811D Laceration without foreign body, right lower leg, subsequent encounter: Secondary | ICD-10-CM | POA: Diagnosis not present

## 2019-06-24 DIAGNOSIS — J410 Simple chronic bronchitis: Secondary | ICD-10-CM | POA: Diagnosis not present

## 2019-06-24 DIAGNOSIS — M810 Age-related osteoporosis without current pathological fracture: Secondary | ICD-10-CM | POA: Diagnosis not present

## 2019-06-24 DIAGNOSIS — K2270 Barrett's esophagus without dysplasia: Secondary | ICD-10-CM | POA: Diagnosis not present

## 2019-06-28 ENCOUNTER — Telehealth: Payer: Self-pay | Admitting: Internal Medicine

## 2019-06-28 NOTE — Telephone Encounter (Signed)
Patient friend Isidoro Donning is calling for a referral for a podiatrist for the patient. Does the patient need an appointments? Please advise Justin Mend [phone]

## 2019-07-01 NOTE — Telephone Encounter (Signed)
I am ok to do referral, just need reason for referral.  Thanks

## 2019-07-02 DIAGNOSIS — K2270 Barrett's esophagus without dysplasia: Secondary | ICD-10-CM | POA: Diagnosis not present

## 2019-07-02 DIAGNOSIS — M810 Age-related osteoporosis without current pathological fracture: Secondary | ICD-10-CM | POA: Diagnosis not present

## 2019-07-02 DIAGNOSIS — S81001D Unspecified open wound, right knee, subsequent encounter: Secondary | ICD-10-CM | POA: Diagnosis not present

## 2019-07-02 DIAGNOSIS — F339 Major depressive disorder, recurrent, unspecified: Secondary | ICD-10-CM | POA: Diagnosis not present

## 2019-07-02 DIAGNOSIS — S81811D Laceration without foreign body, right lower leg, subsequent encounter: Secondary | ICD-10-CM | POA: Diagnosis not present

## 2019-07-02 DIAGNOSIS — H409 Unspecified glaucoma: Secondary | ICD-10-CM | POA: Diagnosis not present

## 2019-07-02 DIAGNOSIS — J410 Simple chronic bronchitis: Secondary | ICD-10-CM | POA: Diagnosis not present

## 2019-07-02 DIAGNOSIS — Z9989 Dependence on other enabling machines and devices: Secondary | ICD-10-CM | POA: Diagnosis not present

## 2019-07-02 DIAGNOSIS — G4733 Obstructive sleep apnea (adult) (pediatric): Secondary | ICD-10-CM | POA: Diagnosis not present

## 2019-07-02 DIAGNOSIS — M6281 Muscle weakness (generalized): Secondary | ICD-10-CM | POA: Diagnosis not present

## 2019-07-02 DIAGNOSIS — R262 Difficulty in walking, not elsewhere classified: Secondary | ICD-10-CM | POA: Diagnosis not present

## 2019-07-02 NOTE — Telephone Encounter (Signed)
Number given is non-working number and unable to reach patient.

## 2019-07-03 NOTE — Telephone Encounter (Signed)
Called pt and she does not feel she needs to see podiatry.  States she does not want to see podiatry at this time.

## 2019-07-03 NOTE — Telephone Encounter (Signed)
Number given non working and cannot reach patient.

## 2019-07-05 DIAGNOSIS — H10232 Serous conjunctivitis, except viral, left eye: Secondary | ICD-10-CM | POA: Diagnosis not present

## 2019-07-05 DIAGNOSIS — H401433 Capsular glaucoma with pseudoexfoliation of lens, bilateral, severe stage: Secondary | ICD-10-CM | POA: Diagnosis not present

## 2019-07-05 DIAGNOSIS — H353132 Nonexudative age-related macular degeneration, bilateral, intermediate dry stage: Secondary | ICD-10-CM | POA: Diagnosis not present

## 2019-07-05 DIAGNOSIS — H18232 Secondary corneal edema, left eye: Secondary | ICD-10-CM | POA: Diagnosis not present

## 2019-07-15 DIAGNOSIS — H6123 Impacted cerumen, bilateral: Secondary | ICD-10-CM | POA: Diagnosis not present

## 2019-07-15 DIAGNOSIS — H903 Sensorineural hearing loss, bilateral: Secondary | ICD-10-CM | POA: Diagnosis not present

## 2019-07-22 ENCOUNTER — Ambulatory Visit (INDEPENDENT_AMBULATORY_CARE_PROVIDER_SITE_OTHER): Payer: Medicare Other | Admitting: Internal Medicine

## 2019-07-22 ENCOUNTER — Other Ambulatory Visit: Payer: Self-pay

## 2019-07-22 ENCOUNTER — Ambulatory Visit: Payer: Medicare Other | Admitting: Internal Medicine

## 2019-07-22 DIAGNOSIS — K2100 Gastro-esophageal reflux disease with esophagitis, without bleeding: Secondary | ICD-10-CM

## 2019-07-22 DIAGNOSIS — E7800 Pure hypercholesterolemia, unspecified: Secondary | ICD-10-CM | POA: Diagnosis not present

## 2019-07-22 DIAGNOSIS — F32A Depression, unspecified: Secondary | ICD-10-CM

## 2019-07-22 DIAGNOSIS — M810 Age-related osteoporosis without current pathological fracture: Secondary | ICD-10-CM

## 2019-07-22 DIAGNOSIS — K2270 Barrett's esophagus without dysplasia: Secondary | ICD-10-CM

## 2019-07-22 DIAGNOSIS — F329 Major depressive disorder, single episode, unspecified: Secondary | ICD-10-CM | POA: Diagnosis not present

## 2019-07-22 DIAGNOSIS — R413 Other amnesia: Secondary | ICD-10-CM

## 2019-07-22 NOTE — Progress Notes (Signed)
Patient ID: Christy Cisneros, female   DOB: Jan 15, 1920, 85 y.o.   MRN: [id number]   Virtual Visit via video Note  This visit type was conducted due to national recommendations for restrictions regarding the COVID-19 pandemic (e.g. social distancing).  This format is felt to be most appropriate for this patient at this time.  All issues noted in this document were discussed and addressed.  No physical exam was performed (except for noted visual exam findings with Video Visits).   I connected with Paulla Dolly by a video enabled telemedicine application and verified that I am speaking with the correct person using two identifiers. Location patient: home Location provider: work Persons participating in the virtual visit: patient, provider and pts friend - Pamala Hurry  I discussed the limitations, risks, security and privacy concerns of performing an evaluation and management service by video and the availability of in person appointments.  The patient expressed understanding and agreed to proceed.   Reason for visit: scheduled follow up.   HPI: She reports she is doing relatively well.  States she feels good.  Is walking.  States she walks with a friend.  No chest pain.  No sob.  Does not report acid reflux.  No abdominal pain.  Bowels moving.  Her friends help her.  Almyra Free gets her groceries.  Lesleigh Noe helps her with her laundry.  Pamala Hurry helps with her appts.  She has recently seen her dentist, audiologist and optometrist.  States she is eating well.  Denies known weight loss.  Discussed osteoporosis.  Discussed treatment.  Has seen endocrinology.  Planning for prolia injection.     ROS: See pertinent positives and negatives per HPI.  Past Medical History:  Diagnosis Date  . Barrett's esophagus with esophagitis   . Chronic bronchitis ([place])   . Depression   . GERD (gastroesophageal reflux disease)   . Glaucoma   . Hypercholesteremia   . Osteoarthritis   . Osteoporosis   . Sleep apnea    has CPAP  . TIA (transient ischemic attack)     Past Surgical History:  Procedure Laterality Date  . BUNIONECTOMY     left  . ROTATOR CUFF REPAIR     right    Family History  Problem Relation Age of Onset  . Breast cancer Sister   . Heart disease Mother   . Alcohol abuse Father     SOCIAL HX: reviewed.    Current Outpatient Medications:  .  acetaminophen (TYLENOL) 325 MG tablet, Take 2 tablets (650 mg total) by mouth every 8 (eight) hours as needed for mild pain., Disp: 100 tablet, Rfl: 1 .  nystatin cream (MYCOSTATIN), Apply 1 application topically 2 (two) times daily. Under the right breast, Disp: 30 g, Rfl: 0 .  omeprazole (PRILOSEC) 20 MG capsule, TAKE ONE CAPSULE TWICE A DAY BEFORE MEALS, Disp: 120 capsule, Rfl: 1 .  tobramycin (TOBREX) 0.3 % ophthalmic solution, , Disp: , Rfl:   EXAM:  GENERAL: alert, oriented, appears well and in no acute distress  HEENT: atraumatic, conjunttiva clear, no obvious abnormalities on inspection of external nose and ears  NECK: normal movements of the head and neck  LUNGS: on inspection no signs of respiratory distress, breathing rate appears normal, no obvious gross SOB, gasping or wheezing  CV: no obvious cyanosis  PSYCH/NEURO: pleasant and cooperative, no obvious depression or anxiety, speech and thought processing grossly intact  ASSESSMENT AND PLAN:  Discussed the following assessment and plan:  BARRETTS ESOPHAGUS No upper symptoms.  Has declined further evaluation.    Depression No reports of depression now.  Friends helping her.  Follow.    GERD (gastroesophageal reflux disease) No upper symptoms reported.    HYPERCHOLESTEROLEMIA Follow lipid panel.   Memory change Has seen neurology.  Friends helping her as outlined.  Follow.    Osteoporosis Continue weight bearing exercise.  Seeing endocrinology.  Planning for prolia.      I discussed the assessment and treatment plan with the patient. The patient was  provided an opportunity to ask questions and all were answered. The patient agreed with the plan and demonstrated an understanding of the instructions.   The patient was advised to call back or seek an in-person evaluation if the symptoms worsen or if the condition fails to improve as anticipated.   Einar Pheasant, MD

## 2019-07-28 ENCOUNTER — Encounter: Payer: Self-pay | Admitting: Internal Medicine

## 2019-07-28 NOTE — Assessment & Plan Note (Signed)
No upper symptoms.  Has declined further evaluation.

## 2019-07-28 NOTE — Assessment & Plan Note (Signed)
No upper symptoms reported.   

## 2019-07-28 NOTE — Assessment & Plan Note (Signed)
Follow lipid panel.   

## 2019-07-28 NOTE — Assessment & Plan Note (Signed)
Continue weight bearing exercise.  Seeing endocrinology.  Planning for prolia.

## 2019-07-28 NOTE — Assessment & Plan Note (Signed)
Has seen neurology.  Friends helping her as outlined.  Follow.

## 2019-07-28 NOTE — Assessment & Plan Note (Signed)
No reports of depression now.  Friends helping her.  Follow.

## 2019-08-09 DIAGNOSIS — H10232 Serous conjunctivitis, except viral, left eye: Secondary | ICD-10-CM | POA: Diagnosis not present

## 2019-08-09 DIAGNOSIS — H353132 Nonexudative age-related macular degeneration, bilateral, intermediate dry stage: Secondary | ICD-10-CM | POA: Diagnosis not present

## 2019-08-09 DIAGNOSIS — H18232 Secondary corneal edema, left eye: Secondary | ICD-10-CM | POA: Diagnosis not present

## 2019-08-09 DIAGNOSIS — H401433 Capsular glaucoma with pseudoexfoliation of lens, bilateral, severe stage: Secondary | ICD-10-CM | POA: Diagnosis not present

## 2019-08-16 DIAGNOSIS — H353132 Nonexudative age-related macular degeneration, bilateral, intermediate dry stage: Secondary | ICD-10-CM | POA: Diagnosis not present

## 2019-08-16 DIAGNOSIS — H401433 Capsular glaucoma with pseudoexfoliation of lens, bilateral, severe stage: Secondary | ICD-10-CM | POA: Diagnosis not present

## 2019-08-16 DIAGNOSIS — H10232 Serous conjunctivitis, except viral, left eye: Secondary | ICD-10-CM | POA: Diagnosis not present

## 2019-08-16 DIAGNOSIS — H18239 Secondary corneal edema, unspecified eye: Secondary | ICD-10-CM | POA: Diagnosis not present

## 2019-08-21 DIAGNOSIS — H353132 Nonexudative age-related macular degeneration, bilateral, intermediate dry stage: Secondary | ICD-10-CM | POA: Diagnosis not present

## 2019-08-21 DIAGNOSIS — H10232 Serous conjunctivitis, except viral, left eye: Secondary | ICD-10-CM | POA: Diagnosis not present

## 2019-08-21 DIAGNOSIS — H401433 Capsular glaucoma with pseudoexfoliation of lens, bilateral, severe stage: Secondary | ICD-10-CM | POA: Diagnosis not present

## 2019-08-21 DIAGNOSIS — H18239 Secondary corneal edema, unspecified eye: Secondary | ICD-10-CM | POA: Diagnosis not present

## 2019-08-27 DIAGNOSIS — H10232 Serous conjunctivitis, except viral, left eye: Secondary | ICD-10-CM | POA: Diagnosis not present

## 2019-08-27 DIAGNOSIS — H353132 Nonexudative age-related macular degeneration, bilateral, intermediate dry stage: Secondary | ICD-10-CM | POA: Diagnosis not present

## 2019-08-27 DIAGNOSIS — H401433 Capsular glaucoma with pseudoexfoliation of lens, bilateral, severe stage: Secondary | ICD-10-CM | POA: Diagnosis not present

## 2019-08-27 DIAGNOSIS — H18232 Secondary corneal edema, left eye: Secondary | ICD-10-CM | POA: Diagnosis not present

## 2019-09-20 DIAGNOSIS — H401433 Capsular glaucoma with pseudoexfoliation of lens, bilateral, severe stage: Secondary | ICD-10-CM | POA: Diagnosis not present

## 2019-09-20 DIAGNOSIS — H18232 Secondary corneal edema, left eye: Secondary | ICD-10-CM | POA: Diagnosis not present

## 2019-09-20 DIAGNOSIS — H10232 Serous conjunctivitis, except viral, left eye: Secondary | ICD-10-CM | POA: Diagnosis not present

## 2019-09-20 DIAGNOSIS — H1820 Unspecified corneal edema: Secondary | ICD-10-CM | POA: Diagnosis not present

## 2019-09-20 DIAGNOSIS — H353132 Nonexudative age-related macular degeneration, bilateral, intermediate dry stage: Secondary | ICD-10-CM | POA: Diagnosis not present

## 2019-10-10 DIAGNOSIS — H353132 Nonexudative age-related macular degeneration, bilateral, intermediate dry stage: Secondary | ICD-10-CM | POA: Diagnosis not present

## 2019-10-10 DIAGNOSIS — H18232 Secondary corneal edema, left eye: Secondary | ICD-10-CM | POA: Diagnosis not present

## 2019-10-10 DIAGNOSIS — H401433 Capsular glaucoma with pseudoexfoliation of lens, bilateral, severe stage: Secondary | ICD-10-CM | POA: Diagnosis not present

## 2019-10-10 DIAGNOSIS — H1820 Unspecified corneal edema: Secondary | ICD-10-CM | POA: Diagnosis not present

## 2019-10-10 DIAGNOSIS — H10232 Serous conjunctivitis, except viral, left eye: Secondary | ICD-10-CM | POA: Diagnosis not present

## 2019-10-14 DIAGNOSIS — H6123 Impacted cerumen, bilateral: Secondary | ICD-10-CM | POA: Diagnosis not present

## 2019-10-14 DIAGNOSIS — H903 Sensorineural hearing loss, bilateral: Secondary | ICD-10-CM | POA: Diagnosis not present

## 2019-10-16 DIAGNOSIS — M810 Age-related osteoporosis without current pathological fracture: Secondary | ICD-10-CM | POA: Diagnosis not present

## 2019-10-28 ENCOUNTER — Telehealth: Payer: Self-pay | Admitting: Internal Medicine

## 2019-10-28 NOTE — Telephone Encounter (Signed)
Just an FYI  I called to try to confirm her appt for 3/30 on 3/25. I told her who I was and where I was calling from multiple times and she kept asking me was I one of her friends. I told her again where I was calling from and she just hang up.  Called again today 3/29 to confirm appt and she asked me for the number to the office so she could call back if she could find someone to take her.  I gave her the number multiple times and she repeated back as I was giving it to her but when I finished giving her the number she repeated back something completely different also she kept forgetting why she was giving her the number and the reason. She couldn't  Remember where Dr. Bary Leriche office was located she said that she would get one of her friends to find it

## 2019-10-29 ENCOUNTER — Ambulatory Visit: Payer: Medicare Other | Admitting: Internal Medicine

## 2019-10-29 DIAGNOSIS — H353132 Nonexudative age-related macular degeneration, bilateral, intermediate dry stage: Secondary | ICD-10-CM | POA: Diagnosis not present

## 2019-10-29 DIAGNOSIS — Z0289 Encounter for other administrative examinations: Secondary | ICD-10-CM

## 2019-10-29 DIAGNOSIS — H1820 Unspecified corneal edema: Secondary | ICD-10-CM | POA: Diagnosis not present

## 2019-10-29 DIAGNOSIS — H18232 Secondary corneal edema, left eye: Secondary | ICD-10-CM | POA: Diagnosis not present

## 2019-10-29 DIAGNOSIS — H10232 Serous conjunctivitis, except viral, left eye: Secondary | ICD-10-CM | POA: Diagnosis not present

## 2019-10-29 DIAGNOSIS — H401433 Capsular glaucoma with pseudoexfoliation of lens, bilateral, severe stage: Secondary | ICD-10-CM | POA: Diagnosis not present

## 2019-10-29 NOTE — Telephone Encounter (Signed)
Rescheduled patient. Patient stated she was not aware of appt today. Scheduled to see Dr Nicki Reaper 4/12

## 2019-11-11 ENCOUNTER — Other Ambulatory Visit: Payer: Self-pay

## 2019-11-11 ENCOUNTER — Ambulatory Visit (INDEPENDENT_AMBULATORY_CARE_PROVIDER_SITE_OTHER): Payer: Medicare Other | Admitting: Internal Medicine

## 2019-11-11 DIAGNOSIS — K2100 Gastro-esophageal reflux disease with esophagitis, without bleeding: Secondary | ICD-10-CM

## 2019-11-11 DIAGNOSIS — F32A Depression, unspecified: Secondary | ICD-10-CM

## 2019-11-11 DIAGNOSIS — E7800 Pure hypercholesterolemia, unspecified: Secondary | ICD-10-CM

## 2019-11-11 DIAGNOSIS — K2270 Barrett's esophagus without dysplasia: Secondary | ICD-10-CM | POA: Diagnosis not present

## 2019-11-11 DIAGNOSIS — G3184 Mild cognitive impairment, so stated: Secondary | ICD-10-CM

## 2019-11-11 DIAGNOSIS — R634 Abnormal weight loss: Secondary | ICD-10-CM | POA: Diagnosis not present

## 2019-11-11 DIAGNOSIS — F329 Major depressive disorder, single episode, unspecified: Secondary | ICD-10-CM

## 2019-11-11 NOTE — Progress Notes (Signed)
Patient ID: Christy Cisneros, female   DOB: [birth year], 86 y.o.   MRN: [id number]   Subjective:    Patient ID: Christy Cisneros, female    DOB: [birth year], 86 y.o.   MRN: [id number]  HPI This visit occurred during the SARS-CoV-2 public health emergency.  Safety protocols were in place, including screening questions prior to the visit, additional usage of staff PPE, and extensive cleaning of exam room while observing appropriate contact time as indicated for disinfecting solutions.  Patient here for a scheduled follow up.  She is accompanied by her friend Pamala Hurry.  History obtained from both of them.  She gets out everyday and walks.  No chest pain or sob with increased activity or exertion.  She has friends who help her do her laundry, get her groceries and bring her to appts.  She is doing better.  Denies depression.  States now that she can get out, she feels she is doing better.  No acid reflux.  No problems swallowing.  No abdominal pain.  Bowels moving.    Past Medical History:  Diagnosis Date  . Barrett's esophagus with esophagitis   . Chronic bronchitis ([place])   . Depression   . GERD (gastroesophageal reflux disease)   . Glaucoma   . Hypercholesteremia   . Osteoarthritis   . Osteoporosis   . Sleep apnea    has CPAP  . TIA (transient ischemic attack)    Past Surgical History:  Procedure Laterality Date  . BUNIONECTOMY     left  . ROTATOR CUFF REPAIR     right   Family History  Problem Relation Age of Onset  . Breast cancer Sister   . Heart disease Mother   . Alcohol abuse Father    Social History   Socioeconomic History  . Marital status: Single    Spouse name: Not on file  . Number of children: Not on file  . Years of education: Not on file  . Highest education level: Not on file  Occupational History  . Not on file  Tobacco Use  . Smoking status: Never Smoker  . Smokeless tobacco: Never Used  Substance and Sexual Activity  . Alcohol use: Yes   Alcohol/week: 0.0 standard drinks    Comment: wine/brandy occasional  . Drug use: No  . Sexual activity: Never  Other Topics Concern  . Not on file  Social History Narrative   Lives alone no kids   Social Determinants of Health   Financial Resource Strain:   . Difficulty of Paying Living Expenses:   Food Insecurity:   . Worried About Charity fundraiser in the Last Year:   . Arboriculturist in the Last Year:   Transportation Needs:   . Film/video editor (Medical):   Marland Kitchen Lack of Transportation (Non-Medical):   Physical Activity:   . Days of Exercise per Week:   . Minutes of Exercise per Session:   Stress:   . Feeling of Stress :   Social Connections:   . Frequency of Communication with Friends and Family:   . Frequency of Social Gatherings with Friends and Family:   . Attends Religious Services:   . Active Member of Clubs or Organizations:   . Attends Archivist Meetings:   Marland Kitchen Marital Status:     Outpatient Encounter Medications as of 11/11/2019  Medication Sig  . acetaminophen (TYLENOL) 325 MG tablet Take 2 tablets (650 mg total) by mouth every 8 (eight) hours as  needed for mild pain.  Marland Kitchen nystatin cream (MYCOSTATIN) Apply 1 application topically 2 (two) times daily. Under the right breast  . omeprazole (PRILOSEC) 20 MG capsule TAKE ONE CAPSULE TWICE A DAY BEFORE MEALS  . tobramycin (TOBREX) 0.3 % ophthalmic solution    No facility-administered encounter medications on file as of 11/11/2019.   Review of Systems  Constitutional: Negative for appetite change and unexpected weight change.  HENT: Negative for congestion and sinus pressure.   Respiratory: Negative for cough, chest tightness and shortness of breath.   Cardiovascular: Negative for chest pain, palpitations and leg swelling.  Gastrointestinal: Negative for abdominal pain, diarrhea, nausea and vomiting.  Genitourinary: Negative for difficulty urinating and dysuria.  Musculoskeletal: Negative for joint  swelling and myalgias.  Skin: Negative for color change and rash.  Neurological: Negative for dizziness, light-headedness and headaches.  Psychiatric/Behavioral: Negative for agitation and dysphoric mood.       Objective:    Physical Exam Constitutional:      General: She is not in acute distress.    Appearance: Normal appearance.  HENT:     Head: Normocephalic and atraumatic.     Right Ear: External ear normal.     Left Ear: External ear normal.  Neck:     Thyroid: No thyromegaly.  Cardiovascular:     Rate and Rhythm: Normal rate and regular rhythm.  Pulmonary:     Effort: No respiratory distress.     Breath sounds: Normal breath sounds. No wheezing.  Abdominal:     General: Bowel sounds are normal.     Palpations: Abdomen is soft.     Tenderness: There is no abdominal tenderness.  Musculoskeletal:        General: No swelling or tenderness.     Cervical back: Neck supple. No tenderness.  Lymphadenopathy:     Cervical: No cervical adenopathy.  Skin:    Findings: No erythema or rash.     Comments: Legs healed.   Neurological:     Mental Status: She is alert.  Psychiatric:        Mood and Affect: Mood normal.        Behavior: Behavior normal.     BP 116/70   Pulse 95   Temp (!) 96.5 F (35.8 C)   Resp 16   Wt 99 lb 12.8 oz (45.3 kg)   SpO2 99%   BMI 19.49 kg/m  Wt Readings from Last 3 Encounters:  11/11/19 99 lb 12.8 oz (45.3 kg)  07/22/19 95 lb (43.1 kg)  05/04/19 110 lb (49.9 kg)     Lab Results  Component Value Date   WBC 5.1 04/11/2019   HGB 12.2 04/11/2019   HCT 37.8 04/11/2019   PLT 281 04/11/2019   GLUCOSE 99 04/11/2019   CHOL 243 (H) 03/21/2019   TRIG 100.0 03/21/2019   HDL 81.40 03/21/2019   LDLCALC 142 (H) 03/21/2019   ALT 13 04/11/2019   AST 21 04/11/2019   NA 140 04/11/2019   K 3.8 04/11/2019   CL 105 04/11/2019   CREATININE 0.93 04/11/2019   BUN 30 (H) 04/11/2019   CO2 24 04/11/2019   TSH 1.290 04/11/2019   INR 0.96 03/16/2017    HGBA1C 5.4 03/17/2017    No results found.     Assessment & Plan:   Problem List Items Addressed This Visit    BARRETTS ESOPHAGUS    Has declined any follow up.  No upper symptoms.        Depression  Doing better.  Feels better.  Follow.        GERD (gastroesophageal reflux disease)    No upper symptoms reported.       HYPERCHOLESTEROLEMIA    Follow lipid panel.       Mild cognitive impairment    Previously saw neurology.  Friends are helping her do her laundry, get her groceries and food, wash her clothes, etc.  She is doing better.  Follow.        Weight loss    Weight improved from last check.  Eating.  Follow.            Einar Pheasant, MD

## 2019-11-12 DIAGNOSIS — H1820 Unspecified corneal edema: Secondary | ICD-10-CM | POA: Diagnosis not present

## 2019-11-12 DIAGNOSIS — H353132 Nonexudative age-related macular degeneration, bilateral, intermediate dry stage: Secondary | ICD-10-CM | POA: Diagnosis not present

## 2019-11-12 DIAGNOSIS — H18232 Secondary corneal edema, left eye: Secondary | ICD-10-CM | POA: Diagnosis not present

## 2019-11-12 DIAGNOSIS — H401433 Capsular glaucoma with pseudoexfoliation of lens, bilateral, severe stage: Secondary | ICD-10-CM | POA: Diagnosis not present

## 2019-11-12 DIAGNOSIS — H10232 Serous conjunctivitis, except viral, left eye: Secondary | ICD-10-CM | POA: Diagnosis not present

## 2019-11-17 ENCOUNTER — Encounter: Payer: Self-pay | Admitting: Internal Medicine

## 2019-11-17 NOTE — Assessment & Plan Note (Signed)
No upper symptoms reported.   

## 2019-11-17 NOTE — Assessment & Plan Note (Signed)
Has declined any follow up.  No upper symptoms.

## 2019-11-17 NOTE — Assessment & Plan Note (Signed)
Weight improved from last check.  Eating.  Follow.

## 2019-11-17 NOTE — Assessment & Plan Note (Signed)
Doing better.  Feels better.  Follow.  

## 2019-11-17 NOTE — Assessment & Plan Note (Signed)
Previously saw neurology.  Friends are helping her do her laundry, get her groceries and food, wash her clothes, etc.  She is doing better.  Follow.

## 2019-11-17 NOTE — Assessment & Plan Note (Signed)
Follow lipid panel.   

## 2019-11-19 DIAGNOSIS — Z85828 Personal history of other malignant neoplasm of skin: Secondary | ICD-10-CM | POA: Diagnosis not present

## 2019-11-19 DIAGNOSIS — Z08 Encounter for follow-up examination after completed treatment for malignant neoplasm: Secondary | ICD-10-CM | POA: Diagnosis not present

## 2019-11-19 DIAGNOSIS — L821 Other seborrheic keratosis: Secondary | ICD-10-CM | POA: Diagnosis not present

## 2019-11-19 DIAGNOSIS — L853 Xerosis cutis: Secondary | ICD-10-CM | POA: Diagnosis not present

## 2019-11-25 DIAGNOSIS — H10232 Serous conjunctivitis, except viral, left eye: Secondary | ICD-10-CM | POA: Diagnosis not present

## 2019-11-25 DIAGNOSIS — H18232 Secondary corneal edema, left eye: Secondary | ICD-10-CM | POA: Diagnosis not present

## 2019-11-25 DIAGNOSIS — H353132 Nonexudative age-related macular degeneration, bilateral, intermediate dry stage: Secondary | ICD-10-CM | POA: Diagnosis not present

## 2019-11-25 DIAGNOSIS — H401433 Capsular glaucoma with pseudoexfoliation of lens, bilateral, severe stage: Secondary | ICD-10-CM | POA: Diagnosis not present

## 2019-11-25 DIAGNOSIS — H1820 Unspecified corneal edema: Secondary | ICD-10-CM | POA: Diagnosis not present

## 2019-12-09 DIAGNOSIS — H353132 Nonexudative age-related macular degeneration, bilateral, intermediate dry stage: Secondary | ICD-10-CM | POA: Diagnosis not present

## 2019-12-09 DIAGNOSIS — H1820 Unspecified corneal edema: Secondary | ICD-10-CM | POA: Diagnosis not present

## 2019-12-09 DIAGNOSIS — H10232 Serous conjunctivitis, except viral, left eye: Secondary | ICD-10-CM | POA: Diagnosis not present

## 2019-12-09 DIAGNOSIS — H401433 Capsular glaucoma with pseudoexfoliation of lens, bilateral, severe stage: Secondary | ICD-10-CM | POA: Diagnosis not present

## 2019-12-09 DIAGNOSIS — H18232 Secondary corneal edema, left eye: Secondary | ICD-10-CM | POA: Diagnosis not present

## 2019-12-26 DIAGNOSIS — H10232 Serous conjunctivitis, except viral, left eye: Secondary | ICD-10-CM | POA: Diagnosis not present

## 2019-12-26 DIAGNOSIS — H18232 Secondary corneal edema, left eye: Secondary | ICD-10-CM | POA: Diagnosis not present

## 2019-12-26 DIAGNOSIS — H401433 Capsular glaucoma with pseudoexfoliation of lens, bilateral, severe stage: Secondary | ICD-10-CM | POA: Diagnosis not present

## 2019-12-26 DIAGNOSIS — H1820 Unspecified corneal edema: Secondary | ICD-10-CM | POA: Diagnosis not present

## 2019-12-26 DIAGNOSIS — H353132 Nonexudative age-related macular degeneration, bilateral, intermediate dry stage: Secondary | ICD-10-CM | POA: Diagnosis not present

## 2020-01-15 DIAGNOSIS — H903 Sensorineural hearing loss, bilateral: Secondary | ICD-10-CM | POA: Diagnosis not present

## 2020-01-15 DIAGNOSIS — H6123 Impacted cerumen, bilateral: Secondary | ICD-10-CM | POA: Diagnosis not present

## 2020-01-15 DIAGNOSIS — H18232 Secondary corneal edema, left eye: Secondary | ICD-10-CM | POA: Diagnosis not present

## 2020-01-15 DIAGNOSIS — H1820 Unspecified corneal edema: Secondary | ICD-10-CM | POA: Diagnosis not present

## 2020-01-15 DIAGNOSIS — H10232 Serous conjunctivitis, except viral, left eye: Secondary | ICD-10-CM | POA: Diagnosis not present

## 2020-01-15 DIAGNOSIS — H353132 Nonexudative age-related macular degeneration, bilateral, intermediate dry stage: Secondary | ICD-10-CM | POA: Diagnosis not present

## 2020-01-15 DIAGNOSIS — H401433 Capsular glaucoma with pseudoexfoliation of lens, bilateral, severe stage: Secondary | ICD-10-CM | POA: Diagnosis not present

## 2020-03-02 DIAGNOSIS — H353132 Nonexudative age-related macular degeneration, bilateral, intermediate dry stage: Secondary | ICD-10-CM | POA: Diagnosis not present

## 2020-03-02 DIAGNOSIS — H18232 Secondary corneal edema, left eye: Secondary | ICD-10-CM | POA: Diagnosis not present

## 2020-03-02 DIAGNOSIS — H1820 Unspecified corneal edema: Secondary | ICD-10-CM | POA: Diagnosis not present

## 2020-03-02 DIAGNOSIS — H10232 Serous conjunctivitis, except viral, left eye: Secondary | ICD-10-CM | POA: Diagnosis not present

## 2020-03-02 DIAGNOSIS — H401433 Capsular glaucoma with pseudoexfoliation of lens, bilateral, severe stage: Secondary | ICD-10-CM | POA: Diagnosis not present

## 2020-03-16 ENCOUNTER — Other Ambulatory Visit: Payer: Self-pay

## 2020-03-16 ENCOUNTER — Ambulatory Visit (INDEPENDENT_AMBULATORY_CARE_PROVIDER_SITE_OTHER): Payer: Medicare Other | Admitting: Internal Medicine

## 2020-03-16 VITALS — BP 126/72 | HR 85 | Temp 97.8°F | Resp 16 | Ht 60.0 in | Wt 99.0 lb

## 2020-03-16 DIAGNOSIS — T148XXA Other injury of unspecified body region, initial encounter: Secondary | ICD-10-CM | POA: Diagnosis not present

## 2020-03-16 DIAGNOSIS — K2270 Barrett's esophagus without dysplasia: Secondary | ICD-10-CM

## 2020-03-16 DIAGNOSIS — G3184 Mild cognitive impairment, so stated: Secondary | ICD-10-CM

## 2020-03-16 DIAGNOSIS — R413 Other amnesia: Secondary | ICD-10-CM

## 2020-03-16 DIAGNOSIS — K2100 Gastro-esophageal reflux disease with esophagitis, without bleeding: Secondary | ICD-10-CM

## 2020-03-16 DIAGNOSIS — E7800 Pure hypercholesterolemia, unspecified: Secondary | ICD-10-CM | POA: Diagnosis not present

## 2020-03-16 LAB — COMPREHENSIVE METABOLIC PANEL
ALT: 13 U/L (ref 0–35)
AST: 20 U/L (ref 0–37)
Albumin: 4.3 g/dL (ref 3.5–5.2)
Alkaline Phosphatase: 52 U/L (ref 39–117)
BUN: 33 mg/dL — ABNORMAL HIGH (ref 6–23)
CO2: 28 mEq/L (ref 19–32)
Calcium: 9.7 mg/dL (ref 8.4–10.5)
Chloride: 102 mEq/L (ref 96–112)
Creatinine, Ser: 1.04 mg/dL (ref 0.40–1.20)
GFR: 48.94 mL/min — ABNORMAL LOW (ref >60.00)
Glucose, Bld: 84 mg/dL (ref 70–99)
Potassium: 4.6 mEq/L (ref 3.5–5.1)
Sodium: 139 mEq/L (ref 135–145)
Total Bilirubin: 0.5 mg/dL (ref 0.2–1.2)
Total Protein: 6.9 g/dL (ref 6.0–8.3)

## 2020-03-16 LAB — CBC WITH DIFFERENTIAL/PLATELET
Basophils Absolute: 0.1 10*3/uL (ref 0.0–0.1)
Basophils Relative: 1.2 % (ref 0.0–3.0)
Eosinophils Absolute: 0.1 10*3/uL (ref 0.0–0.7)
Eosinophils Relative: 1.7 % (ref 0.0–5.0)
HCT: 41.7 % (ref 36.0–46.0)
Hemoglobin: 13.6 g/dL (ref 12.0–15.0)
Lymphocytes Relative: 15.7 % (ref 12.0–46.0)
Lymphs Abs: 1.1 10*3/uL (ref 0.7–4.0)
MCHC: 32.7 g/dL (ref 30.0–36.0)
MCV: 93 fl (ref 78.0–100.0)
Monocytes Absolute: 0.7 10*3/uL (ref 0.1–1.0)
Monocytes Relative: 9.4 % (ref 3.0–12.0)
Neutro Abs: 5.1 10*3/uL (ref 1.4–7.7)
Neutrophils Relative %: 72 % (ref 43.0–77.0)
Platelets: 294 10*3/uL (ref 150.0–400.0)
RBC: 4.48 Mil/uL (ref 3.87–5.11)
RDW: 14.7 % (ref 11.5–15.5)
WBC: 7.1 10*3/uL (ref 4.0–10.5)

## 2020-03-16 LAB — TSH: TSH: 1.49 u[IU]/mL (ref 0.35–4.50)

## 2020-03-16 LAB — VITAMIN B12: Vitamin B-12: 555 pg/mL (ref 211–911)

## 2020-03-16 NOTE — Progress Notes (Signed)
Patient ID: Christy Cisneros, female   DOB: 03-26-1920, 86 y.o.   MRN: [id number]   Subjective:    Patient ID: Christy Cisneros, female    DOB: 01-16-1920, 86 y.o.   MRN: [id number]  HPI This visit occurred during the SARS-CoV-2 public health emergency.  Safety protocols were in place, including screening questions prior to the visit, additional usage of staff PPE, and extensive cleaning of exam room while observing appropriate contact time as indicated for disinfecting solutions.  Patient here for a scheduled follow up.  She is accompanied by Pamala Hurry.  History obtained from both of them.  Doing well.  Still walking.  Eating.  Weight is stable.  No chest pain or sob reported.  No abdominal pain or bowel change reported.  Lesion - raised/scabbed - left leg.   No pain.  Pamala Hurry and other friends helping her with laundry, groceries, etc.  Overall they feel things are stable.    Past Medical History:  Diagnosis Date  . Barrett's esophagus with esophagitis   . Chronic bronchitis ([place])   . Depression   . GERD (gastroesophageal reflux disease)   . Glaucoma   . Hypercholesteremia   . Osteoarthritis   . Osteoporosis   . Sleep apnea    has CPAP  . TIA (transient ischemic attack)    Past Surgical History:  Procedure Laterality Date  . BUNIONECTOMY     left  . ROTATOR CUFF REPAIR     right   Family History  Problem Relation Age of Onset  . Breast cancer Sister   . Heart disease Mother   . Alcohol abuse Father    Social History   Socioeconomic History  . Marital status: Single    Spouse name: Not on file  . Number of children: Not on file  . Years of education: Not on file  . Highest education level: Not on file  Occupational History  . Not on file  Tobacco Use  . Smoking status: Never Smoker  . Smokeless tobacco: Never Used  Vaping Use  . Vaping Use: Never used  Substance and Sexual Activity  . Alcohol use: Yes    Alcohol/week: 0.0 standard drinks    Comment:  wine/brandy occasional  . Drug use: No  . Sexual activity: Never  Other Topics Concern  . Not on file  Social History Narrative   Lives alone no kids   Social Determinants of Health   Financial Resource Strain:   . Difficulty of Paying Living Expenses: Not on file  Food Insecurity:   . Worried About Charity fundraiser in the Last Year: Not on file  . Ran Out of Food in the Last Year: Not on file  Transportation Needs:   . Lack of Transportation (Medical): Not on file  . Lack of Transportation (Non-Medical): Not on file  Physical Activity:   . Days of Exercise per Week: Not on file  . Minutes of Exercise per Session: Not on file  Stress:   . Feeling of Stress : Not on file  Social Connections:   . Frequency of Communication with Friends and Family: Not on file  . Frequency of Social Gatherings with Friends and Family: Not on file  . Attends Religious Services: Not on file  . Active Member of Clubs or Organizations: Not on file  . Attends Archivist Meetings: Not on file  . Marital Status: Not on file    Outpatient Encounter Medications as of 03/16/2020  Medication Sig  .  acetaminophen (TYLENOL) 325 MG tablet Take 2 tablets (650 mg total) by mouth every 8 (eight) hours as needed for mild pain.  Marland Kitchen nystatin cream (MYCOSTATIN) Apply 1 application topically 2 (two) times daily. Under the right breast  . omeprazole (PRILOSEC) 20 MG capsule TAKE ONE CAPSULE TWICE A DAY BEFORE MEALS  . tobramycin (TOBREX) 0.3 % ophthalmic solution    No facility-administered encounter medications on file as of 03/16/2020.    Review of Systems  Constitutional: Negative for appetite change and unexpected weight change.  HENT: Negative for congestion and sinus pressure.   Respiratory: Negative for cough, chest tightness and shortness of breath.   Cardiovascular: Negative for chest pain, palpitations and leg swelling.  Gastrointestinal: Negative for abdominal pain, diarrhea, nausea and  vomiting.  Genitourinary: Negative for difficulty urinating and dysuria.  Musculoskeletal: Negative for joint swelling and myalgias.  Skin: Negative for color change and rash.       Raised lesion - scabbed lesion - lower leg. No evidence of infection.   Neurological: Negative for dizziness, light-headedness and headaches.  Psychiatric/Behavioral: Negative for agitation and dysphoric mood.       Objective:    Physical Exam Vitals reviewed.  Constitutional:      General: She is not in acute distress.    Appearance: Normal appearance.  HENT:     Head: Normocephalic and atraumatic.     Right Ear: External ear normal.     Left Ear: External ear normal.  Eyes:     General: No scleral icterus.       Right eye: No discharge.        Left eye: No discharge.     Conjunctiva/sclera: Conjunctivae normal.  Neck:     Thyroid: No thyromegaly.  Cardiovascular:     Rate and Rhythm: Normal rate and regular rhythm.  Pulmonary:     Effort: No respiratory distress.     Breath sounds: Normal breath sounds. No wheezing.  Abdominal:     General: Bowel sounds are normal.     Palpations: Abdomen is soft.     Tenderness: There is no abdominal tenderness.  Musculoskeletal:        General: No swelling or tenderness.     Cervical back: Neck supple. No tenderness.  Lymphadenopathy:     Cervical: No cervical adenopathy.  Skin:    Findings: No erythema or rash.     Comments: Raised scabbed lesion lower leg - no surrounding erythema.  Non tender.   Neurological:     Mental Status: She is alert.  Psychiatric:        Mood and Affect: Mood normal.        Behavior: Behavior normal.     BP 126/72   Pulse 85   Temp 97.8 F (36.6 C) (Oral)   Resp 16   Ht 5' (1.524 m)   Wt 99 lb (44.9 kg)   SpO2 96%   BMI 19.33 kg/m  Wt Readings from Last 3 Encounters:  03/16/20 99 lb (44.9 kg)  11/11/19 99 lb 12.8 oz (45.3 kg)  07/22/19 95 lb (43.1 kg)     Lab Results  Component Value Date   WBC 7.1  03/16/2020   HGB 13.6 03/16/2020   HCT 41.7 03/16/2020   PLT 294.0 03/16/2020   GLUCOSE 84 03/16/2020   CHOL 243 (H) 03/21/2019   TRIG 100.0 03/21/2019   HDL 81.40 03/21/2019   LDLCALC 142 (H) 03/21/2019   ALT 13 03/16/2020   AST 20 03/16/2020  NA 139 03/16/2020   K 4.6 03/16/2020   CL 102 03/16/2020   CREATININE 1.04 03/16/2020   BUN 33 (H) 03/16/2020   CO2 28 03/16/2020   TSH 1.49 03/16/2020   INR 0.96 03/16/2017   HGBA1C 5.4 03/17/2017       Assessment & Plan:   Problem List Items Addressed This Visit    Mild cognitive impairment    Previously saw neurology.  Friends are helping her with laundry, getting her food and groceries, washing her clothes, etc.  Overall doing well.  Follow.        Memory change - Primary   Relevant Orders   Vitamin B12 (Completed)   HYPERCHOLESTEROLEMIA    Follow lipid panel.       Relevant Orders   Comprehensive metabolic panel (Completed)   CBC with Differential/Platelet (Completed)   TSH (Completed)   GERD (gastroesophageal reflux disease)    No upper symptoms reported.  Follow.       BARRETTS ESOPHAGUS    No upper symptoms reported.  Has declined f/u.       Abrasion    Raised lesion lower leg.  No surrounding erythema.  Non tender.  Follow.           Einar Pheasant, MD

## 2020-03-23 ENCOUNTER — Encounter: Payer: Self-pay | Admitting: Internal Medicine

## 2020-03-23 DIAGNOSIS — H903 Sensorineural hearing loss, bilateral: Secondary | ICD-10-CM | POA: Diagnosis not present

## 2020-03-23 NOTE — Assessment & Plan Note (Signed)
Follow lipid panel.   

## 2020-03-23 NOTE — Assessment & Plan Note (Signed)
Raised lesion lower leg.  No surrounding erythema.  Non tender.  Follow.

## 2020-03-23 NOTE — Assessment & Plan Note (Signed)
No upper symptoms reported.  Follow.   

## 2020-03-23 NOTE — Assessment & Plan Note (Signed)
Previously saw neurology.  Friends are helping her with laundry, getting her food and groceries, washing her clothes, etc.  Overall doing well.  Follow.

## 2020-03-23 NOTE — Assessment & Plan Note (Signed)
No upper symptoms reported.  Has declined f/u.

## 2020-03-24 DIAGNOSIS — H401433 Capsular glaucoma with pseudoexfoliation of lens, bilateral, severe stage: Secondary | ICD-10-CM | POA: Diagnosis not present

## 2020-03-24 DIAGNOSIS — H1820 Unspecified corneal edema: Secondary | ICD-10-CM | POA: Diagnosis not present

## 2020-03-24 DIAGNOSIS — H18232 Secondary corneal edema, left eye: Secondary | ICD-10-CM | POA: Diagnosis not present

## 2020-03-24 DIAGNOSIS — H10232 Serous conjunctivitis, except viral, left eye: Secondary | ICD-10-CM | POA: Diagnosis not present

## 2020-03-24 DIAGNOSIS — H353132 Nonexudative age-related macular degeneration, bilateral, intermediate dry stage: Secondary | ICD-10-CM | POA: Diagnosis not present

## 2020-03-26 ENCOUNTER — Telehealth: Payer: Self-pay | Admitting: Internal Medicine

## 2020-03-26 NOTE — Telephone Encounter (Signed)
Rhunette Croft called in for patient stated that patient fail and hurt her knee patient was taken to the walk in clinic they wanted her to go to the wound care clinic did not know if she needed a referral to go or do she need to come in to see Dr.Scott  You con tact Rhunette Croft at [phone]

## 2020-03-27 NOTE — Telephone Encounter (Signed)
LMTCB

## 2020-03-27 NOTE — Telephone Encounter (Signed)
Information for wound clinic provided to [facility]. She is going to call and let me know if they need anything from [place]

## 2020-04-08 DIAGNOSIS — H18232 Secondary corneal edema, left eye: Secondary | ICD-10-CM | POA: Diagnosis not present

## 2020-04-08 DIAGNOSIS — H10232 Serous conjunctivitis, except viral, left eye: Secondary | ICD-10-CM | POA: Diagnosis not present

## 2020-04-08 DIAGNOSIS — H1820 Unspecified corneal edema: Secondary | ICD-10-CM | POA: Diagnosis not present

## 2020-04-08 DIAGNOSIS — Z961 Presence of intraocular lens: Secondary | ICD-10-CM | POA: Diagnosis not present

## 2020-04-08 DIAGNOSIS — H5310 Unspecified subjective visual disturbances: Secondary | ICD-10-CM | POA: Diagnosis not present

## 2020-04-15 DIAGNOSIS — H401433 Capsular glaucoma with pseudoexfoliation of lens, bilateral, severe stage: Secondary | ICD-10-CM | POA: Diagnosis not present

## 2020-04-15 DIAGNOSIS — H353132 Nonexudative age-related macular degeneration, bilateral, intermediate dry stage: Secondary | ICD-10-CM | POA: Diagnosis not present

## 2020-04-15 DIAGNOSIS — H1820 Unspecified corneal edema: Secondary | ICD-10-CM | POA: Diagnosis not present

## 2020-04-15 DIAGNOSIS — H18232 Secondary corneal edema, left eye: Secondary | ICD-10-CM | POA: Diagnosis not present

## 2020-04-15 DIAGNOSIS — H10232 Serous conjunctivitis, except viral, left eye: Secondary | ICD-10-CM | POA: Diagnosis not present

## 2020-04-16 DIAGNOSIS — H6123 Impacted cerumen, bilateral: Secondary | ICD-10-CM | POA: Diagnosis not present

## 2020-04-16 DIAGNOSIS — H903 Sensorineural hearing loss, bilateral: Secondary | ICD-10-CM | POA: Diagnosis not present

## 2020-04-22 DIAGNOSIS — M810 Age-related osteoporosis without current pathological fracture: Secondary | ICD-10-CM | POA: Diagnosis not present

## 2020-04-30 DIAGNOSIS — M810 Age-related osteoporosis without current pathological fracture: Secondary | ICD-10-CM | POA: Diagnosis not present

## 2020-05-05 DIAGNOSIS — H401433 Capsular glaucoma with pseudoexfoliation of lens, bilateral, severe stage: Secondary | ICD-10-CM | POA: Diagnosis not present

## 2020-05-05 DIAGNOSIS — H353132 Nonexudative age-related macular degeneration, bilateral, intermediate dry stage: Secondary | ICD-10-CM | POA: Diagnosis not present

## 2020-05-05 DIAGNOSIS — H18232 Secondary corneal edema, left eye: Secondary | ICD-10-CM | POA: Diagnosis not present

## 2020-05-05 DIAGNOSIS — H1820 Unspecified corneal edema: Secondary | ICD-10-CM | POA: Diagnosis not present

## 2020-05-05 DIAGNOSIS — H10232 Serous conjunctivitis, except viral, left eye: Secondary | ICD-10-CM | POA: Diagnosis not present

## 2020-05-27 ENCOUNTER — Telehealth: Payer: Self-pay

## 2020-05-27 DIAGNOSIS — H1820 Unspecified corneal edema: Secondary | ICD-10-CM | POA: Diagnosis not present

## 2020-05-27 DIAGNOSIS — H18232 Secondary corneal edema, left eye: Secondary | ICD-10-CM | POA: Diagnosis not present

## 2020-05-27 DIAGNOSIS — H353132 Nonexudative age-related macular degeneration, bilateral, intermediate dry stage: Secondary | ICD-10-CM | POA: Diagnosis not present

## 2020-05-27 DIAGNOSIS — H401433 Capsular glaucoma with pseudoexfoliation of lens, bilateral, severe stage: Secondary | ICD-10-CM | POA: Diagnosis not present

## 2020-05-27 DIAGNOSIS — H10232 Serous conjunctivitis, except viral, left eye: Secondary | ICD-10-CM | POA: Diagnosis not present

## 2020-05-27 NOTE — Telephone Encounter (Signed)
Pamala Hurry called and said patient's ankles and lower legs are swollen. I transferred her to access nurse to be triaged.

## 2020-05-28 NOTE — Telephone Encounter (Signed)
If the 12:00 tomorrow changes - can you see if she can come in at 12:00 tomorrow.

## 2020-05-28 NOTE — Telephone Encounter (Signed)
Please call her and advise below and let me know if there is anything else they need or are requesting.

## 2020-05-28 NOTE — Telephone Encounter (Signed)
Please call Christy Cisneros [phone]

## 2020-05-28 NOTE — Telephone Encounter (Signed)
Left message for patient to return call back. Need to see if patient went to ED/UC to be evaluated and to follow up with patient.      Christy Cisneros RECORD AccessNurse Patient Name: Christy Cisneros Gender: Female DOB: 11/05/1919 Age: 86 Y 20 D Return Phone Number: [phone] (Primary) Address: City/State/Zip: [place] [address] Client [facility] Day - [place] Client Site [place] - Day Physician Christy Cisneros - MD Contact Type Call Who Is Calling Patient / Member / Family / Caregiver Call Type Triage / Clinical Relationship To Patient Self Return Phone Number [phone] (Primary) Chief Complaint Leg Swelling And Edema Reason for Call Symptomatic / Request for [name] states that her ankles and lower legs are swollen. Translation No No Triage Reason Other Nurse Assessment Nurse: Christy Gaskins, RN, Christy Cisneros Date/Time Christy Cisneros Time): 05/27/2020 3:16:06 PM Confirm and document reason for call. If symptomatic, describe symptoms. ---Caller states that the pt's ankles and lower legs are swollen. Does the patient have any new or worsening symptoms? ---Yes Will a triage be completed? ---Yes Related visit to physician within the last 2 weeks? ---No Does the PT have any chronic conditions? (i.e. diabetes, asthma, this includes High risk factors for pregnancy, etc.) ---Yes List chronic conditions. ---vision issues Is this a behavioral health or substance abuse call? ---No Nurse: Christy Pang, RN, Christy Cisneros Date/Time (Eastern Time): 05/27/2020 4:11:26 PM Confirm and document reason for call. If symptomatic, describe symptoms. ---The caller states that Christy Cisneros is off running errands with someone. So she was not with the patient to answer any questions now. This nurse told her that she can call back any time. She states that she is probably going to  wait until the morning to call back as if she is well enough to be out running errands she should be able to wait about her leg swelling. This nurse told her that nursing staff is available 24 hours per day and that she can call back any time and she verbalized understanding. Does the patient have any new or worsening symptoms? ---Yes Will a triage be completed? ---No Select reason for no triage. ---Other PLEASE NOTE: All timestamps contained within this report are represented as [place] Standard Time. CONFIDENTIALTY NOTICE: This fax transmission is intended only for the addressee. It contains information that is legally privileged, confidential or otherwise protected from use or disclosure. If you are not the intended recipient, you are strictly prohibited from reviewing, disclosing, copying using or disseminating any of this information or taking any action in reliance on or regarding this information. If you have received this fax in error, please notify us immediately by telephone so that we can arrange for its return to [place]. Phone: [phone], Toll-Free: [phone], Fax: [phone] Page: 2 of 2 Call Id: [id number] Nurse Assessment Please document clinical information provided and list any resource used. ---The caller is not with the person she is calling about and have not been able to get through to the person who is out with the patient running errands. Guidelines Guideline Title Affirmed Question Affirmed Notes Nurse Date/Time (Eastern Time) Disp. Time Christy Cisneros Time) Disposition Final User 05/27/2020 3:20:21 PM Send To RN Personal Christy Gaskins, RN, Christy Cisneros 05/27/2020 3:53:34 PM Send To RN Personal Christy Gaskins, RN, Christy Cisneros 05/27/2020 3:54:04 PM Send To RN Personal Christy Gaskins, RN, Christy Cisneros 05/27/2020 4:16:32 PM Clinical Call Yes Christy Pang, RN, Christy Cisneros Comments User: Christy Lambert, RN Date/Time Christy Cisneros Time): 05/27/2020 3:19:51 PM  caller is not with pt; she will go to pt location and Rn will  attempt to call in 45 minutes.

## 2020-05-28 NOTE — Telephone Encounter (Signed)
Patient called in stated that she did not go to ED or URGENT CARE she stated that she thinks things are ok for now

## 2020-05-28 NOTE — Telephone Encounter (Signed)
Spoke with Pamala Hurry and she is going to bring patient in tomorrow at 3.

## 2020-05-28 NOTE — Telephone Encounter (Signed)
Please confirm with pt doing ok.  No other symptoms - sob, etc.  Elevate legs, etc.  If any acute symptoms, needs to be evaluated.

## 2020-05-28 NOTE — Telephone Encounter (Signed)
Patient has a big scab on her shin and thinks it is getting bigger. Patient misled Pamala Hurry and previous messages. What patient was trying to say is that she has a big scab on her shin that is getting bigger and is slight red around the edges. She wants Dr Nicki Reaper to see it and place a referral for home health nurse to take care of wound.

## 2020-05-28 NOTE — Telephone Encounter (Signed)
Left message for patient to return call back.  

## 2020-05-29 ENCOUNTER — Other Ambulatory Visit: Payer: Self-pay

## 2020-05-29 ENCOUNTER — Ambulatory Visit (INDEPENDENT_AMBULATORY_CARE_PROVIDER_SITE_OTHER): Payer: Medicare Other | Admitting: Internal Medicine

## 2020-05-29 ENCOUNTER — Encounter: Payer: Self-pay | Admitting: Internal Medicine

## 2020-05-29 DIAGNOSIS — T148XXA Other injury of unspecified body region, initial encounter: Secondary | ICD-10-CM | POA: Diagnosis not present

## 2020-05-29 DIAGNOSIS — M79674 Pain in right toe(s): Secondary | ICD-10-CM

## 2020-05-29 DIAGNOSIS — K2100 Gastro-esophageal reflux disease with esophagitis, without bleeding: Secondary | ICD-10-CM

## 2020-05-29 MED ORDER — MUPIROCIN 2 % EX OINT: TOPICAL_OINTMENT | CUTANEOUS | 0 refills | Status: DC

## 2020-05-29 NOTE — Progress Notes (Signed)
Patient ID: Christy Cisneros, female   DOB: [birth year], 86 y.o.   MRN: [id number]   Subjective:    Patient ID: Christy Cisneros, female    DOB: 06/30/1920, 86 y.o.   MRN: [id number]  HPI This visit occurred during the SARS-CoV-2 public health emergency.  Safety protocols were in place, including screening questions prior to the visit, additional usage of staff PPE, and extensive cleaning of exam room while observing appropriate contact time as indicated for disinfecting solutions.  Patient here for work in appt. She is accompanied by Pamala Hurry.  History obtained from both of them.  Noticed wound - lower leg.  Scabbed over.  Also reports right toe - lesion/pain.  No known injury.  She is eating.  Getting around her house ok.  Goes out shopping with friends.  Has multiple people who check in on her.  Overall feels things are stable.    Past Medical History:  Diagnosis Date  . Barrett's esophagus with esophagitis   . Chronic bronchitis ([place])   . Depression   . GERD (gastroesophageal reflux disease)   . Glaucoma   . Hypercholesteremia   . Osteoarthritis   . Osteoporosis   . Sleep apnea    has CPAP  . TIA (transient ischemic attack)    Past Surgical History:  Procedure Laterality Date  . BUNIONECTOMY     left  . ROTATOR CUFF REPAIR     right   Family History  Problem Relation Age of Onset  . Breast cancer Sister   . Heart disease Mother   . Alcohol abuse Father    Social History   Socioeconomic History  . Marital status: Single    Spouse name: Not on file  . Number of children: Not on file  . Years of education: Not on file  . Highest education level: Not on file  Occupational History  . Not on file  Tobacco Use  . Smoking status: Never Smoker  . Smokeless tobacco: Never Used  Vaping Use  . Vaping Use: Never used  Substance and Sexual Activity  . Alcohol use: Yes    Alcohol/week: 0.0 standard drinks    Comment: wine/brandy occasional  . Drug use: No  . Sexual  activity: Never  Other Topics Concern  . Not on file  Social History Narrative   Lives alone no kids   Social Determinants of Health   Financial Resource Strain:   . Difficulty of Paying Living Expenses: Not on file  Food Insecurity:   . Worried About Charity fundraiser in the Last Year: Not on file  . Ran Out of Food in the Last Year: Not on file  Transportation Needs:   . Lack of Transportation (Medical): Not on file  . Lack of Transportation (Non-Medical): Not on file  Physical Activity:   . Days of Exercise per Week: Not on file  . Minutes of Exercise per Session: Not on file  Stress:   . Feeling of Stress : Not on file  Social Connections:   . Frequency of Communication with Friends and Family: Not on file  . Frequency of Social Gatherings with Friends and Family: Not on file  . Attends Religious Services: Not on file  . Active Member of Clubs or Organizations: Not on file  . Attends Archivist Meetings: Not on file  . Marital Status: Not on file    Outpatient Encounter Medications as of 05/29/2020  Medication Sig  . acetaminophen (TYLENOL) 325 MG tablet  Take 2 tablets (650 mg total) by mouth every 8 (eight) hours as needed for mild pain.  Marland Kitchen nystatin cream (MYCOSTATIN) Apply 1 application topically 2 (two) times daily. Under the right breast  . omeprazole (PRILOSEC) 20 MG capsule TAKE ONE CAPSULE TWICE A DAY BEFORE MEALS  . tobramycin (TOBREX) 0.3 % ophthalmic solution   . mupirocin ointment (BACTROBAN) 2 % Apply to affected area on toe bid   No facility-administered encounter medications on file as of 05/29/2020.    Review of Systems  Constitutional: Negative for appetite change and fever.  HENT: Negative for congestion and sinus pressure.   Respiratory: Negative for cough, chest tightness and shortness of breath.   Cardiovascular: Negative for chest pain and palpitations.       Some leg swelling.    Gastrointestinal: Negative for abdominal pain,  diarrhea, nausea and vomiting.  Genitourinary: Negative for difficulty urinating and dysuria.  Musculoskeletal: Negative for back pain, joint swelling and myalgias.  Skin:       Lower extremity lesion - scabbed.  Toe lesion - right.    Neurological: Negative for dizziness, light-headedness and headaches.  Psychiatric/Behavioral: Negative for agitation and dysphoric mood.       Objective:    Physical Exam Constitutional:      General: She is not in acute distress.    Appearance: Normal appearance.  HENT:     Head: Normocephalic and atraumatic.     Right Ear: External ear normal.     Left Ear: External ear normal.  Eyes:     General: No scleral icterus.       Right eye: No discharge.        Left eye: No discharge.     Conjunctiva/sclera: Conjunctivae normal.  Neck:     Thyroid: No thyromegaly.  Cardiovascular:     Rate and Rhythm: Normal rate and regular rhythm.  Pulmonary:     Effort: No respiratory distress.     Breath sounds: Normal breath sounds. No wheezing.  Abdominal:     General: Bowel sounds are normal.     Palpations: Abdomen is soft.     Tenderness: There is no abdominal tenderness.  Musculoskeletal:        General: No tenderness.     Cervical back: Neck supple. No tenderness.  Lymphadenopathy:     Cervical: No cervical adenopathy.  Skin:    Findings: No rash.     Comments: Scabbed lesion - lower extremity.  No surrounding erythema. Right toe - minimal erythema, tender to palpation.   Neurological:     Mental Status: She is alert.  Psychiatric:        Mood and Affect: Mood normal.        Behavior: Behavior normal.     BP 118/76   Pulse 82   Temp (!) 97.5 F (36.4 C) (Temporal)   Ht 5' (1.524 m)   Wt 99 lb 6.4 oz (45.1 kg)   SpO2 95%   BMI 19.41 kg/m  Wt Readings from Last 3 Encounters:  05/29/20 99 lb 6.4 oz (45.1 kg)  03/16/20 99 lb (44.9 kg)  11/11/19 99 lb 12.8 oz (45.3 kg)     Lab Results  Component Value Date   WBC 7.1 03/16/2020    HGB 13.6 03/16/2020   HCT 41.7 03/16/2020   PLT 294.0 03/16/2020   GLUCOSE 84 03/16/2020   CHOL 243 (H) 03/21/2019   TRIG 100.0 03/21/2019   HDL 81.40 03/21/2019   LDLCALC 142 (H) 03/21/2019  ALT 13 03/16/2020   AST 20 03/16/2020   NA 139 03/16/2020   K 4.6 03/16/2020   CL 102 03/16/2020   CREATININE 1.04 03/16/2020   BUN 33 (H) 03/16/2020   CO2 28 03/16/2020   TSH 1.49 03/16/2020   INR 0.96 03/16/2017   HGBA1C 5.4 03/17/2017       Assessment & Plan:   Problem List Items Addressed This Visit    Toe pain    Minimal erythema and tenderness.  Bactroban.  Discussed possible need for podiatry referral.  Follow.        GERD (gastroesophageal reflux disease)    No acid reflux reported.  Eating.  Weight stable.       Abrasion    Raised scabbed area - lower leg.  Keep open as much as possible.  Discussed possible wound therapy.  Follow.            Einar Pheasant, MD

## 2020-06-02 DIAGNOSIS — M810 Age-related osteoporosis without current pathological fracture: Secondary | ICD-10-CM | POA: Diagnosis not present

## 2020-06-02 DIAGNOSIS — E559 Vitamin D deficiency, unspecified: Secondary | ICD-10-CM | POA: Diagnosis not present

## 2020-06-07 ENCOUNTER — Telehealth: Payer: Self-pay | Admitting: Internal Medicine

## 2020-06-07 DIAGNOSIS — M79676 Pain in unspecified toe(s): Secondary | ICD-10-CM | POA: Insufficient documentation

## 2020-06-07 NOTE — Assessment & Plan Note (Signed)
Raised scabbed area - lower leg.  Keep open as much as possible.  Discussed possible wound therapy.  Follow.

## 2020-06-07 NOTE — Assessment & Plan Note (Signed)
No acid reflux reported.  Eating.  Weight stable.

## 2020-06-07 NOTE — Telephone Encounter (Signed)
Please call Christy Cisneros and find out how Christy Cisneros's leg and toe are doing.  Improved?

## 2020-06-07 NOTE — Assessment & Plan Note (Signed)
Minimal erythema and tenderness.  Bactroban.  Discussed possible need for podiatry referral.  Follow.

## 2020-06-08 NOTE — Telephone Encounter (Signed)
LMTCB

## 2020-06-09 NOTE — Telephone Encounter (Signed)
Given unchanged, agree with keeping appt with dermatology.  Let us know if any problems.    Dr Nicki Reaper

## 2020-06-09 NOTE — Telephone Encounter (Signed)
Spoke with Christy Cisneros. Patients toe is looking better but she said the place on her leg doesn't really look any better or worse. She does have an appointment with the dermatologist on Monday. Advised I would call her back if they needed to do anything different.

## 2020-06-09 NOTE — Telephone Encounter (Signed)
Christy Cisneros is aware.

## 2020-06-15 DIAGNOSIS — X32XXXA Exposure to sunlight, initial encounter: Secondary | ICD-10-CM | POA: Diagnosis not present

## 2020-06-15 DIAGNOSIS — L570 Actinic keratosis: Secondary | ICD-10-CM | POA: Diagnosis not present

## 2020-06-16 DIAGNOSIS — H353132 Nonexudative age-related macular degeneration, bilateral, intermediate dry stage: Secondary | ICD-10-CM | POA: Diagnosis not present

## 2020-06-16 DIAGNOSIS — H10232 Serous conjunctivitis, except viral, left eye: Secondary | ICD-10-CM | POA: Diagnosis not present

## 2020-06-16 DIAGNOSIS — H401433 Capsular glaucoma with pseudoexfoliation of lens, bilateral, severe stage: Secondary | ICD-10-CM | POA: Diagnosis not present

## 2020-06-16 DIAGNOSIS — H1820 Unspecified corneal edema: Secondary | ICD-10-CM | POA: Diagnosis not present

## 2020-06-16 DIAGNOSIS — H18232 Secondary corneal edema, left eye: Secondary | ICD-10-CM | POA: Diagnosis not present

## 2020-06-18 DIAGNOSIS — L988 Other specified disorders of the skin and subcutaneous tissue: Secondary | ICD-10-CM | POA: Diagnosis not present

## 2020-06-18 DIAGNOSIS — L814 Other melanin hyperpigmentation: Secondary | ICD-10-CM | POA: Diagnosis not present

## 2020-06-18 DIAGNOSIS — Z85828 Personal history of other malignant neoplasm of skin: Secondary | ICD-10-CM | POA: Diagnosis not present

## 2020-06-18 DIAGNOSIS — L578 Other skin changes due to chronic exposure to nonionizing radiation: Secondary | ICD-10-CM | POA: Diagnosis not present

## 2020-06-18 DIAGNOSIS — C44729 Squamous cell carcinoma of skin of left lower limb, including hip: Secondary | ICD-10-CM | POA: Diagnosis not present

## 2020-06-22 ENCOUNTER — Telehealth: Payer: Self-pay | Admitting: Internal Medicine

## 2020-06-22 NOTE — Telephone Encounter (Signed)
Pt had Pamala Hurry stated they spoke with Dr Merritt's office and was told that they needed to call Medicare for home health. Pamala Hurry was wondering if we could place orders to have home health to come out and change the dressing on her. Advised that typically dermatology would give orders for wound care since they did the procedure. I will call Dr Dimple Nanas office in the morning to clarify. This is just FYI for you

## 2020-06-22 NOTE — Telephone Encounter (Signed)
Christy Cisneros returned your call.

## 2020-06-22 NOTE — Telephone Encounter (Signed)
LMTCB

## 2020-06-22 NOTE — Telephone Encounter (Signed)
Patient just went to dermatology at [facility]. Patient is requesting home health care to come in and change bandages. Please call Pamala Hurry her number is on file.

## 2020-06-23 NOTE — Telephone Encounter (Signed)
Agree with Dr Merrit's office for orders, etc.

## 2020-06-23 NOTE — Telephone Encounter (Signed)
Christy Cisneros called to follow up on Home health order

## 2020-06-23 NOTE — Telephone Encounter (Signed)
LM for Dr Dimple Nanas office. Spoke with Pamala Hurry. Medicare is stating that orders should come from them as well.

## 2020-06-30 NOTE — Telephone Encounter (Signed)
Noted. Pamala Hurry is aware.

## 2020-06-30 NOTE — Telephone Encounter (Signed)
Christy Cisneros has been changing wound daily. Looks ok., Dr Dimple Nanas office is still not going to write orders for home health. They see [place] on 12/16. Will f/u then. If they need something before then, Christy Cisneros will let us know.

## 2020-06-30 NOTE — Telephone Encounter (Signed)
If they need an earlier appt - let me know.  We can evaluated and if wound care needed, can then arrange if needed.

## 2020-06-30 NOTE — Telephone Encounter (Signed)
This is just an FYI for you

## 2020-07-03 DIAGNOSIS — G8918 Other acute postprocedural pain: Secondary | ICD-10-CM | POA: Diagnosis not present

## 2020-07-03 DIAGNOSIS — L570 Actinic keratosis: Secondary | ICD-10-CM | POA: Diagnosis not present

## 2020-07-07 DIAGNOSIS — H401433 Capsular glaucoma with pseudoexfoliation of lens, bilateral, severe stage: Secondary | ICD-10-CM | POA: Diagnosis not present

## 2020-07-07 DIAGNOSIS — H18232 Secondary corneal edema, left eye: Secondary | ICD-10-CM | POA: Diagnosis not present

## 2020-07-07 DIAGNOSIS — H10232 Serous conjunctivitis, except viral, left eye: Secondary | ICD-10-CM | POA: Diagnosis not present

## 2020-07-07 DIAGNOSIS — H353132 Nonexudative age-related macular degeneration, bilateral, intermediate dry stage: Secondary | ICD-10-CM | POA: Diagnosis not present

## 2020-07-07 DIAGNOSIS — H1820 Unspecified corneal edema: Secondary | ICD-10-CM | POA: Diagnosis not present

## 2020-07-09 DIAGNOSIS — S81802D Unspecified open wound, left lower leg, subsequent encounter: Secondary | ICD-10-CM | POA: Diagnosis not present

## 2020-07-14 ENCOUNTER — Telehealth: Payer: Self-pay | Admitting: Internal Medicine

## 2020-07-14 NOTE — Telephone Encounter (Signed)
Rhunette Croft called in wanted to know if Dr.Scott could change the unna boot badge at visit she has the replacement [phone]

## 2020-07-14 NOTE — Telephone Encounter (Signed)
Called patient to let her know that we are not certified to change the unna boot in our office.

## 2020-07-16 ENCOUNTER — Ambulatory Visit: Payer: Medicare Other | Admitting: Internal Medicine

## 2020-07-16 DIAGNOSIS — S81802D Unspecified open wound, left lower leg, subsequent encounter: Secondary | ICD-10-CM | POA: Diagnosis not present

## 2020-07-21 DIAGNOSIS — Z85828 Personal history of other malignant neoplasm of skin: Secondary | ICD-10-CM | POA: Diagnosis not present

## 2020-07-28 DIAGNOSIS — H401433 Capsular glaucoma with pseudoexfoliation of lens, bilateral, severe stage: Secondary | ICD-10-CM | POA: Diagnosis not present

## 2020-07-28 DIAGNOSIS — H10232 Serous conjunctivitis, except viral, left eye: Secondary | ICD-10-CM | POA: Diagnosis not present

## 2020-07-28 DIAGNOSIS — H353132 Nonexudative age-related macular degeneration, bilateral, intermediate dry stage: Secondary | ICD-10-CM | POA: Diagnosis not present

## 2020-07-28 DIAGNOSIS — H18232 Secondary corneal edema, left eye: Secondary | ICD-10-CM | POA: Diagnosis not present

## 2020-07-28 DIAGNOSIS — H1820 Unspecified corneal edema: Secondary | ICD-10-CM | POA: Diagnosis not present

## 2020-07-30 DIAGNOSIS — H903 Sensorineural hearing loss, bilateral: Secondary | ICD-10-CM | POA: Diagnosis not present

## 2020-07-30 DIAGNOSIS — H6123 Impacted cerumen, bilateral: Secondary | ICD-10-CM | POA: Diagnosis not present

## 2020-08-19 DIAGNOSIS — H18232 Secondary corneal edema, left eye: Secondary | ICD-10-CM | POA: Diagnosis not present

## 2020-08-19 DIAGNOSIS — H401433 Capsular glaucoma with pseudoexfoliation of lens, bilateral, severe stage: Secondary | ICD-10-CM | POA: Diagnosis not present

## 2020-08-19 DIAGNOSIS — H1820 Unspecified corneal edema: Secondary | ICD-10-CM | POA: Diagnosis not present

## 2020-08-19 DIAGNOSIS — H353132 Nonexudative age-related macular degeneration, bilateral, intermediate dry stage: Secondary | ICD-10-CM | POA: Diagnosis not present

## 2020-08-19 DIAGNOSIS — H10232 Serous conjunctivitis, except viral, left eye: Secondary | ICD-10-CM | POA: Diagnosis not present

## 2020-09-10 DIAGNOSIS — Z85828 Personal history of other malignant neoplasm of skin: Secondary | ICD-10-CM | POA: Diagnosis not present

## 2020-09-11 DIAGNOSIS — H1820 Unspecified corneal edema: Secondary | ICD-10-CM | POA: Diagnosis not present

## 2020-09-11 DIAGNOSIS — H401433 Capsular glaucoma with pseudoexfoliation of lens, bilateral, severe stage: Secondary | ICD-10-CM | POA: Diagnosis not present

## 2020-09-11 DIAGNOSIS — H10232 Serous conjunctivitis, except viral, left eye: Secondary | ICD-10-CM | POA: Diagnosis not present

## 2020-09-11 DIAGNOSIS — H353132 Nonexudative age-related macular degeneration, bilateral, intermediate dry stage: Secondary | ICD-10-CM | POA: Diagnosis not present

## 2020-09-11 DIAGNOSIS — H18232 Secondary corneal edema, left eye: Secondary | ICD-10-CM | POA: Diagnosis not present

## 2020-09-16 ENCOUNTER — Telehealth: Payer: Self-pay

## 2020-09-16 NOTE — Telephone Encounter (Signed)
Pt's niece Earlie Server) called and states that the family is looking for someone to sit with pt during the day on some days. She wants to know if PCP knows anyone to recommend. Earlie Server is not on the DPR from what I could see. FYI

## 2020-09-17 NOTE — Telephone Encounter (Signed)
I know of an excellent private sitter I can give number and name that has excellent references.

## 2020-09-17 NOTE — Telephone Encounter (Signed)
Info given to Ey[place] who is listed on [facility]

## 2020-09-17 NOTE — Telephone Encounter (Signed)
Do you have any recommendations for private pay sitters? Home health will not do this. Her niece is not on the DPR so we can only give general information that may help them.

## 2020-09-17 NOTE — Telephone Encounter (Signed)
Noted.  Let me know what I need to do.

## 2020-09-29 ENCOUNTER — Ambulatory Visit (INDEPENDENT_AMBULATORY_CARE_PROVIDER_SITE_OTHER): Payer: Medicare Other

## 2020-09-29 ENCOUNTER — Telehealth: Payer: Self-pay | Admitting: Internal Medicine

## 2020-09-29 ENCOUNTER — Other Ambulatory Visit: Payer: Self-pay

## 2020-09-29 DIAGNOSIS — Z111 Encounter for screening for respiratory tuberculosis: Secondary | ICD-10-CM

## 2020-09-29 NOTE — Telephone Encounter (Signed)
Patient coming in today for tb skin test and she is dropping off paperwork for assistant living

## 2020-09-29 NOTE — Progress Notes (Signed)
Patient presented for PPD Placement injection to left forearm, patient voiced no concerns nor showed any signs of distress during injection.

## 2020-09-29 NOTE — Telephone Encounter (Signed)
Noted  

## 2020-10-01 ENCOUNTER — Ambulatory Visit: Payer: Medicare Other

## 2020-10-01 ENCOUNTER — Other Ambulatory Visit: Payer: Self-pay

## 2020-10-01 ENCOUNTER — Ambulatory Visit: Payer: Medicare Other | Admitting: Internal Medicine

## 2020-10-01 DIAGNOSIS — Z111 Encounter for screening for respiratory tuberculosis: Secondary | ICD-10-CM

## 2020-10-01 LAB — TB SKIN TEST: TB Skin Test: NEGATIVE

## 2020-10-01 NOTE — Progress Notes (Signed)
Patient presented for PPD read to left forearm, patient voiced no concerns nor showed any signs of distress.

## 2020-10-02 DIAGNOSIS — H10232 Serous conjunctivitis, except viral, left eye: Secondary | ICD-10-CM | POA: Diagnosis not present

## 2020-10-02 DIAGNOSIS — H401433 Capsular glaucoma with pseudoexfoliation of lens, bilateral, severe stage: Secondary | ICD-10-CM | POA: Diagnosis not present

## 2020-10-02 DIAGNOSIS — H1820 Unspecified corneal edema: Secondary | ICD-10-CM | POA: Diagnosis not present

## 2020-10-02 DIAGNOSIS — H353132 Nonexudative age-related macular degeneration, bilateral, intermediate dry stage: Secondary | ICD-10-CM | POA: Diagnosis not present

## 2020-10-02 DIAGNOSIS — H18232 Secondary corneal edema, left eye: Secondary | ICD-10-CM | POA: Diagnosis not present

## 2020-10-02 NOTE — Telephone Encounter (Signed)
LM for [facility]ry to find out if they have a place set for pt to be placed.

## 2020-10-05 ENCOUNTER — Telehealth: Payer: Self-pay | Admitting: Internal Medicine

## 2020-10-05 NOTE — Telephone Encounter (Signed)
Patient's daughter called in about paperwork for patient

## 2020-10-06 NOTE — Telephone Encounter (Signed)
Spoke with Christy Cisneros. The paperwork we have is for 2 different facilities. Advised that typically, we do a visit to discuss and complete paperwork once placement is determined. We can do a generic FL2 for them to have if they are not sure of where she is going. Patient has not agreed to be placed anywhere yet. Her nieces have arrived. I have scheduled her for an appointment this Thursday while family is here to discuss with Dr Nicki Reaper. TB Skin test has been placed. Paperwork will be completed after the visit. Paperwork filed for appt.

## 2020-10-06 NOTE — Telephone Encounter (Signed)
See other note for documentation 

## 2020-10-08 ENCOUNTER — Ambulatory Visit (INDEPENDENT_AMBULATORY_CARE_PROVIDER_SITE_OTHER): Payer: Medicare Other | Admitting: Internal Medicine

## 2020-10-08 ENCOUNTER — Other Ambulatory Visit: Payer: Self-pay

## 2020-10-08 DIAGNOSIS — Z0289 Encounter for other administrative examinations: Secondary | ICD-10-CM | POA: Diagnosis not present

## 2020-10-08 DIAGNOSIS — M810 Age-related osteoporosis without current pathological fracture: Secondary | ICD-10-CM | POA: Diagnosis not present

## 2020-10-08 DIAGNOSIS — G3184 Mild cognitive impairment, so stated: Secondary | ICD-10-CM

## 2020-10-08 DIAGNOSIS — M79674 Pain in right toe(s): Secondary | ICD-10-CM | POA: Diagnosis not present

## 2020-10-08 DIAGNOSIS — E7800 Pure hypercholesterolemia, unspecified: Secondary | ICD-10-CM

## 2020-10-08 DIAGNOSIS — H409 Unspecified glaucoma: Secondary | ICD-10-CM | POA: Diagnosis not present

## 2020-10-08 DIAGNOSIS — K2100 Gastro-esophageal reflux disease with esophagitis, without bleeding: Secondary | ICD-10-CM | POA: Diagnosis not present

## 2020-10-08 DIAGNOSIS — R634 Abnormal weight loss: Secondary | ICD-10-CM | POA: Diagnosis not present

## 2020-10-08 MED ORDER — MUPIROCIN 2 % EX OINT: TOPICAL_OINTMENT | CUTANEOUS | 0 refills | Status: DC

## 2020-10-08 NOTE — Progress Notes (Signed)
Patient ID: Christy Cisneros, female   DOB: 11/06/1919, 87 y.o.   MRN: [id number]   Subjective:    Patient ID: Christy Cisneros, female    DOB: 1920-04-28, 87 y.o.   MRN: [id number]  HPI This visit occurred during the SARS-CoV-2 public health emergency.  Safety protocols were in place, including screening questions prior to the visit, additional usage of staff PPE, and extensive cleaning of exam room while observing appropriate contact time as indicated for disinfecting solutions.  Patient here for a work in appt to discuss placement - assisted living/form completion.  She is accompanied by her niece.  Manus Gunning has been living by herself - with help from friends - doing laundry, grocery shopping, etc. Family has discussed with her regarding transitioning to assisted living.  She is agreeable.  Discussed with her today.  She tries to stay active.  No chest pain reported.  Breathing stable.  Eating.  No nausea or vomiting.  Bowels moving.  Weight is stable.  Niece concerned regarding her great toe. No pain with walking.  Unclear how long toe affected.    Past Medical History:  Diagnosis Date  . Barrett's esophagus with esophagitis   . Chronic bronchitis ([place])   . Depression   . GERD (gastroesophageal reflux disease)   . Glaucoma   . Hypercholesteremia   . Osteoarthritis   . Osteoporosis   . Sleep apnea    has CPAP  . TIA (transient ischemic attack)    Past Surgical History:  Procedure Laterality Date  . BUNIONECTOMY     left  . ROTATOR CUFF REPAIR     right   Family History  Problem Relation Age of Onset  . Breast cancer Sister   . Heart disease Mother   . Alcohol abuse Father    Social History   Socioeconomic History  . Marital status: Single    Spouse name: Not on file  . Number of children: Not on file  . Years of education: Not on file  . Highest education level: Not on file  Occupational History  . Not on file  Tobacco Use  . Smoking status: Never Smoker  .  Smokeless tobacco: Never Used  Vaping Use  . Vaping Use: Never used  Substance and Sexual Activity  . Alcohol use: Yes    Alcohol/week: 0.0 standard drinks    Comment: wine/brandy occasional  . Drug use: No  . Sexual activity: Never  Other Topics Concern  . Not on file  Social History Narrative   Lives alone no kids   Social Determinants of Health   Financial Resource Strain: Not on file  Food Insecurity: Not on file  Transportation Needs: Not on file  Physical Activity: Not on file  Stress: Not on file  Social Connections: Not on file    Outpatient Encounter Medications as of 10/08/2020  Medication Sig  . acetaminophen (TYLENOL) 325 MG tablet Take 2 tablets (650 mg total) by mouth every 8 (eight) hours as needed for mild pain.  . mupirocin ointment (BACTROBAN) 2 % Apply to affected area on toe bid  . nystatin cream (MYCOSTATIN) Apply 1 application topically 2 (two) times daily. Under the right breast  . omeprazole (PRILOSEC) 20 MG capsule TAKE ONE CAPSULE TWICE A DAY BEFORE MEALS  . tobramycin (TOBREX) 0.3 % ophthalmic solution   . [DISCONTINUED] mupirocin ointment (BACTROBAN) 2 % Apply to affected area on toe bid   No facility-administered encounter medications on file as of 10/08/2020.  Review of Systems  Constitutional: Negative for appetite change and unexpected weight change.  HENT: Negative for congestion and sinus pressure.   Respiratory: Negative for cough, chest tightness and shortness of breath.   Cardiovascular: Negative for chest pain and palpitations.       No increased leg swelling.    Gastrointestinal: Negative for abdominal pain, diarrhea, nausea and vomiting.  Genitourinary: Negative for difficulty urinating and dysuria.  Musculoskeletal: Negative for joint swelling and myalgias.  Skin: Negative for rash.       No open wounds - legs.  Some minimal erythema - nail bed - great toe.  Some increased soft tissue swelling.  Minimal tenderness - around nail  bed.  No pain base of toe.   Neurological: Negative for dizziness, light-headedness and headaches.  Psychiatric/Behavioral: Negative for agitation and dysphoric mood.       Objective:    Physical Exam Vitals reviewed.  Constitutional:      General: She is not in acute distress.    Appearance: Normal appearance.  HENT:     Head: Normocephalic and atraumatic.     Right Ear: External ear normal.     Left Ear: External ear normal.  Eyes:     General: No scleral icterus.       Right eye: No discharge.        Left eye: No discharge.     Conjunctiva/sclera: Conjunctivae normal.  Neck:     Thyroid: No thyromegaly.  Cardiovascular:     Rate and Rhythm: Normal rate and regular rhythm.  Pulmonary:     Effort: No respiratory distress.     Breath sounds: Normal breath sounds. No wheezing.  Abdominal:     General: Bowel sounds are normal.     Palpations: Abdomen is soft.     Tenderness: There is no abdominal tenderness.  Musculoskeletal:        General: No tenderness.     Cervical back: Neck supple. No tenderness.     Comments: No open wounds - legs.  Some minimal erythema - nail bed - great toe.  Some increased soft tissue swelling.  Minimal tenderness - around nail bed.  No pain base of toe.  No increased swelling legs.    Lymphadenopathy:     Cervical: No cervical adenopathy.  Skin:    Findings: No erythema or rash.  Neurological:     Mental Status: She is alert.  Psychiatric:        Mood and Affect: Mood normal.        Behavior: Behavior normal.     BP 120/76   Pulse 88   Temp (!) 97.3 F (36.3 C) (Oral)   Resp 16   Wt 101 lb (45.8 kg)   SpO2 99%   BMI 19.73 kg/m  Wt Readings from Last 3 Encounters:  10/08/20 101 lb (45.8 kg)  05/29/20 99 lb 6.4 oz (45.1 kg)  03/16/20 99 lb (44.9 kg)     Lab Results  Component Value Date   WBC 7.1 03/16/2020   HGB 13.6 03/16/2020   HCT 41.7 03/16/2020   PLT 294.0 03/16/2020   GLUCOSE 84 03/16/2020   CHOL 243 (H)  03/21/2019   TRIG 100.0 03/21/2019   HDL 81.40 03/21/2019   LDLCALC 142 (H) 03/21/2019   ALT 13 03/16/2020   AST 20 03/16/2020   NA 139 03/16/2020   K 4.6 03/16/2020   CL 102 03/16/2020   CREATININE 1.04 03/16/2020   BUN 33 (H) 03/16/2020  CO2 28 03/16/2020   TSH 1.49 03/16/2020   INR 0.96 03/16/2017   HGBA1C 5.4 03/17/2017       Assessment & Plan:   Problem List Items Addressed This Visit    Encounter for completion of form with patient    Discussed with Manus Gunning and her niece today.  Planning - assisted living.  Form completed.       GERD (gastroesophageal reflux disease)    No acid reflux reported.  Eating.  Weight stable.  Follow.       Glaucoma    Followed by ophthalmology.        HYPERCHOLESTEROLEMIA    Follow lipid panel.       Mild cognitive impairment    Previously saw neurology.  Diagnosed with "mild cognitive impairment".  Discussed with niece today.  Discussed referral back to neurology.  Hold for now.  Consider f/u after move.        Osteoporosis    Seeing endocrinology. Planning - [facility].       Toe pain    Nail bed - surrounding tenderness.  Minimal erythema.  Some increased soft tissue swelling.  bactroban as directed.  Refer to podiatry.  Pt agreeable.      Relevant Orders   Ambulatory referral to Podiatry   Weight loss    Reports she is eating.  Good appetite.  Weight improved from previous check.  Follow.          I spent more than 40 minutes with the patient and more than 50% of the time was spent in consultation regarding the above.  Time spent discussing concern issues and concerns.  Time also spent discussing assisted living and plan for further treatment and evaluation.   Einar Pheasant, MD

## 2020-10-09 NOTE — Telephone Encounter (Signed)
Patient called in for paperwork work for assistant living would like if it could be sent today by noon  she has an appointment at [facility]

## 2020-10-09 NOTE — Telephone Encounter (Signed)
FL2 handed to [place] to fax. Will complete packet Monday.

## 2020-10-10 ENCOUNTER — Encounter: Payer: Self-pay | Admitting: Internal Medicine

## 2020-10-10 DIAGNOSIS — Z0289 Encounter for other administrative examinations: Secondary | ICD-10-CM | POA: Insufficient documentation

## 2020-10-10 NOTE — Assessment & Plan Note (Signed)
Followed by ophthalmology.   

## 2020-10-10 NOTE — Assessment & Plan Note (Signed)
Seeing endocrinology. Planning - [facility].

## 2020-10-10 NOTE — Assessment & Plan Note (Signed)
Discussed with Manus Gunning and her niece today.  Planning - assisted living.  Form completed.

## 2020-10-10 NOTE — Assessment & Plan Note (Signed)
Follow lipid panel.   

## 2020-10-10 NOTE — Assessment & Plan Note (Signed)
No acid reflux reported.  Eating.  Weight stable.  Follow.

## 2020-10-10 NOTE — Assessment & Plan Note (Signed)
Nail bed - surrounding tenderness.  Minimal erythema.  Some increased soft tissue swelling.  bactroban as directed.  Refer to podiatry.  Pt agreeable.

## 2020-10-10 NOTE — Assessment & Plan Note (Signed)
Reports she is eating.  Good appetite.  Weight improved from previous check.  Follow.

## 2020-10-10 NOTE — Assessment & Plan Note (Signed)
Previously saw neurology.  Diagnosed with "mild cognitive impairment".  Discussed with niece today.  Discussed referral back to neurology.  Hold for now.  Consider f/u after move.

## 2020-10-12 ENCOUNTER — Telehealth: Payer: Self-pay | Admitting: Internal Medicine

## 2020-10-12 NOTE — Telephone Encounter (Signed)
[facility] called stating that they are not moving the patient to Memory care they are going to try in home care at this time. No need for a call back just an FYI.

## 2020-10-13 DIAGNOSIS — L600 Ingrowing nail: Secondary | ICD-10-CM | POA: Diagnosis not present

## 2020-10-13 DIAGNOSIS — R238 Other skin changes: Secondary | ICD-10-CM | POA: Diagnosis not present

## 2020-10-13 DIAGNOSIS — L942 Calcinosis cutis: Secondary | ICD-10-CM | POA: Diagnosis not present

## 2020-10-13 DIAGNOSIS — L929 Granulomatous disorder of the skin and subcutaneous tissue, unspecified: Secondary | ICD-10-CM | POA: Diagnosis not present

## 2020-10-13 DIAGNOSIS — D485 Neoplasm of uncertain behavior of skin: Secondary | ICD-10-CM | POA: Diagnosis not present

## 2020-10-13 DIAGNOSIS — L03032 Cellulitis of left toe: Secondary | ICD-10-CM | POA: Diagnosis not present

## 2020-10-13 DIAGNOSIS — L97521 Non-pressure chronic ulcer of other part of left foot limited to breakdown of skin: Secondary | ICD-10-CM | POA: Diagnosis not present

## 2020-10-13 DIAGNOSIS — B351 Tinea unguium: Secondary | ICD-10-CM | POA: Diagnosis not present

## 2020-10-13 NOTE — Telephone Encounter (Signed)
Does not have to be another visit.  Just need to confirm with pt desires DNR

## 2020-10-13 NOTE — Telephone Encounter (Signed)
Patient is not going to be placed. They are hiring in home care. Patient has misplaced her DNR. Niece was wanting to know if we can sign another or does this need to be another visit to discuss in order to sign?

## 2020-10-13 NOTE — Telephone Encounter (Signed)
LM for patient

## 2020-10-13 NOTE — Telephone Encounter (Signed)
Edit; LM for patients niece to return my call.

## 2020-10-14 NOTE — Telephone Encounter (Signed)
LMTCB

## 2020-10-14 NOTE — Telephone Encounter (Signed)
Christy Cisneros returned phone call, appointment has been cancelled. Christy Cisneros said at one time patient did have a DNR on refrigerator and would like another one to replace missing DNR.

## 2020-10-15 ENCOUNTER — Telehealth: Payer: Self-pay

## 2020-10-15 NOTE — Telephone Encounter (Signed)
Confirmed this is the patients wishes. Discussed with Pamala Hurry and pt. Placed DNR up front for pick up

## 2020-10-15 NOTE — Telephone Encounter (Signed)
DNR completed for signature

## 2020-10-15 NOTE — Telephone Encounter (Signed)
2 copies of DNR placed up front in envelope for pick up.

## 2020-10-22 DIAGNOSIS — H353132 Nonexudative age-related macular degeneration, bilateral, intermediate dry stage: Secondary | ICD-10-CM | POA: Diagnosis not present

## 2020-10-22 DIAGNOSIS — H18232 Secondary corneal edema, left eye: Secondary | ICD-10-CM | POA: Diagnosis not present

## 2020-10-22 DIAGNOSIS — H1820 Unspecified corneal edema: Secondary | ICD-10-CM | POA: Diagnosis not present

## 2020-10-22 DIAGNOSIS — H10232 Serous conjunctivitis, except viral, left eye: Secondary | ICD-10-CM | POA: Diagnosis not present

## 2020-10-22 DIAGNOSIS — H401433 Capsular glaucoma with pseudoexfoliation of lens, bilateral, severe stage: Secondary | ICD-10-CM | POA: Diagnosis not present

## 2020-10-26 ENCOUNTER — Ambulatory Visit: Payer: Medicare Other | Admitting: Internal Medicine

## 2020-10-29 DIAGNOSIS — H6123 Impacted cerumen, bilateral: Secondary | ICD-10-CM | POA: Diagnosis not present

## 2020-10-29 DIAGNOSIS — H903 Sensorineural hearing loss, bilateral: Secondary | ICD-10-CM | POA: Diagnosis not present

## 2020-11-02 ENCOUNTER — Other Ambulatory Visit: Payer: Self-pay

## 2020-11-02 ENCOUNTER — Emergency Department
Admission: EM | Admit: 2020-11-02 | Discharge: 2020-11-02 | Disposition: A | Discharge status: Home / Self Care | Payer: Medicare Other | Attending: Emergency Medicine | Admitting: Emergency Medicine

## 2020-11-02 DIAGNOSIS — Y92019 Unspecified place in single-family (private) house as the place of occurrence of the external cause: Secondary | ICD-10-CM | POA: Insufficient documentation

## 2020-11-02 DIAGNOSIS — S81812A Laceration without foreign body, left lower leg, initial encounter: Secondary | ICD-10-CM | POA: Diagnosis not present

## 2020-11-02 DIAGNOSIS — W228XXA Striking against or struck by other objects, initial encounter: Secondary | ICD-10-CM | POA: Insufficient documentation

## 2020-11-02 DIAGNOSIS — S8992XA Unspecified injury of left lower leg, initial encounter: Secondary | ICD-10-CM | POA: Diagnosis present

## 2020-11-02 NOTE — ED Provider Notes (Signed)
[facility] Emergency Department Provider Note   ____________________________________________   Event Date/Time   First MD Initiated Contact with Patient 11/02/20 0945     (approximate)  I have reviewed the triage vital signs and the nursing notes.   HISTORY  Chief Complaint Laceration    HPI Christy Cisneros is a 87 y.o. female patient complaint of laceration to the distal third of the left lower leg.  Patient hit her leg on the back of her step.  Patient presents with skin tear to the back of her left lower leg.  No bleeding.  Denies loss of sensation or loss of function of the leg.  Rates her pain as a 3/10.  Described pain as "sore".  Wound was bandaged in triage.         Past Medical History:  Diagnosis Date  . Barrett's esophagus with esophagitis   . Chronic bronchitis ([place])   . Depression   . GERD (gastroesophageal reflux disease)   . Glaucoma   . Hypercholesteremia   . Osteoarthritis   . Osteoporosis   . Sleep apnea    has CPAP  . TIA (transient ischemic attack)     Patient Active Problem List   Diagnosis Date Noted  . Encounter for completion of form with patient 10/10/2020  . Toe pain 06/07/2020  . Weight loss 03/25/2019  . Pain due to onychomycosis of toenails of both feet 03/11/2019  . Depression 05/06/2018  . Abrasion 02/28/2018  . History of CVA (cerebrovascular accident) 03/16/2017  . Memory change 09/04/2016  . Pneumonia 07/17/2016  . Mild cognitive impairment 05/15/2016  . Dizziness 04/21/2016  . Neck pain 02/11/2016  . Muscle cramps 11/14/2015  . Urine incontinence 08/16/2015  . Hematoma 08/16/2015  . Weakness 07/12/2015  . Headache 06/16/2015  . Unsteady gait 06/01/2015  . GERD (gastroesophageal reflux disease) 08/11/2014  . DYSPNEA 04/17/2009  . HYPERCHOLESTEROLEMIA 04/16/2009  . Obstructive sleep apnea 04/16/2009  . Glaucoma 04/16/2009  . Transient cerebral ischemia 04/16/2009  . BARRETTS ESOPHAGUS  04/16/2009  . OSTEOARTHRITIS 04/16/2009  . Osteoporosis 04/16/2009    Past Surgical History:  Procedure Laterality Date  . BUNIONECTOMY     left  . ROTATOR CUFF REPAIR     right    Prior to Admission medications   Medication Sig Start Date End Date Taking? Authorizing Provider  acetaminophen (TYLENOL) 325 MG tablet Take 2 tablets (650 mg total) by mouth every 8 (eight) hours as needed for mild pain. 07/10/18   Jodelle Green, FNP  mupirocin ointment (BACTROBAN) 2 % Apply to affected area on toe bid 10/08/20   Einar Pheasant, MD  nystatin cream (MYCOSTATIN) Apply 1 application topically 2 (two) times daily. Under the right breast 08/22/18   Einar Pheasant, MD  omeprazole (PRILOSEC) 20 MG capsule TAKE ONE CAPSULE TWICE A DAY BEFORE MEALS 11/17/16   Einar Pheasant, MD  tobramycin (TOBREX) 0.3 % ophthalmic solution  11/02/17   [provider]    Allergies Bacitracin, Cephalexin, Codeine, Avelox [moxifloxacin hcl in nacl], Cefdinir, Clarithromycin, Hydrocodone-acetaminophen, Lansoprazole, Polymyxin b, Vigamox [moxifloxacin], Brimonidine, and Erythromycin  Family History  Problem Relation Age of Onset  . Breast cancer Sister   . Heart disease Mother   . Alcohol abuse Father     Social History Social History   Tobacco Use  . Smoking status: Never Smoker  . Smokeless tobacco: Never Used  Vaping Use  . Vaping Use: Never used  Substance Use Topics  . Alcohol use:  Yes    Alcohol/week: 0.0 standard drinks    Comment: wine/brandy occasional  . Drug use: No    Review of Systems Constitutional: No fever/chills Eyes: No visual changes. ENT: No sore throat. Cardiovascular: Denies chest pain. Respiratory: Denies shortness of breath. Gastrointestinal: No abdominal pain.  No nausea, no vomiting.  No diarrhea.  No constipation. Genitourinary: Negative for dysuria. Musculoskeletal: Negative for back pain. Skin: Skin tear posterior left lower leg. Neurological: Negative for  headaches, focal weakness or numbness. Psychiatric:  Depression Endocrine:  Hyperlipidemia Hematological/Lymphatic:  Allergic/Immunilogical: See multiple allergic drug list. ____________________________________________   PHYSICAL EXAM:  VITAL SIGNS: ED Triage Vitals 11/02/20 0908  Enc Vitals Group     BP (!) 125/98     Pulse Rate 95     Resp 16     Temp 97.9 F (36.6 C)     Temp Source Oral     SpO2 97 %     Weight 98 lb (44.5 kg)     Height 4\' 11"  (1.499 m)     Head Circumference      Peak Flow      Pain Score 3     Pain Loc      Pain Edu?      Excl. in [place]?    Constitutional: Alert and oriented. Well appearing and in no acute distress.  Patient is hearing impaired. Eyes: Conjunctivae are normal. PERRL. EOMI. Head: Atraumatic. Nose: No congestion/rhinnorhea. Mouth/Throat: Mucous membranes are moist.  Oropharynx non-erythematous. Neck: No stridor.  Hematological/Lymphatic/Immunilogical: No cervical lymphadenopathy. Cardiovascular: Normal rate, regular rhythm. Grossly normal heart sounds.  Good peripheral circulation. Respiratory: Normal respiratory effort.  No retractions. Lungs CTAB. Gastrointestinal: Soft and nontender. No distention. No abdominal bruits. No CVA tenderness. Genitourinary: Deferred Musculoskeletal: No lower extremity tenderness nor edema.  No joint effusions. Neurologic:  Normal speech and language. No gross focal neurologic deficits are appreciated. No gait instability. Skin: Skin tear left lower leg.   Psychiatric: Mood and affect are normal. Speech and behavior are normal.  ____________________________________________   LABS (all labs ordered are listed, but only abnormal results are displayed)  Labs Reviewed - No data to display ____________________________________________  EKG   ____________________________________________  RADIOLOGY I, Sable Feil, personally viewed and evaluated these images (plain radiographs) as part of my medical  decision making, as well as reviewing the written report by the radiologist.  ED MD interpretation:    Official radiology report(s): No results found.  ____________________________________________   PROCEDURES  Procedure(s) performed (including Critical Care):  Marland KitchenMarland KitchenLaceration Repair  Date/Time: 11/02/2020 10:07 AM Performed by: Sable Feil, PA-C Authorized by: Sable Feil, PA-C   Consent:    Consent obtained:  Verbal   Consent given by:  Patient   Risks, benefits, and alternatives were discussed: yes     Risks discussed:  Infection, pain, poor cosmetic result and need for additional repair Universal protocol:    Patient identity confirmed:  Verbally with patient and arm band Anesthesia:    Anesthesia method:  None Laceration details:    Location:  Leg   Leg location:  L lower leg   Length (cm):  1.5   Depth (mm):  1 Pre-procedure details:    Preparation:  Patient was prepped and draped in usual sterile fashion Exploration:    Contaminated: no   Treatment:    Area cleansed with:  Povidone-iodine and saline   Amount of cleaning:  Standard   Debridement:  None   Undermining:  None  Scar revision: no   Skin repair:    Repair method:  Tissue adhesive Approximation:    Approximation:  Close Post-procedure details:    Dressing:  Sterile dressing and non-adherent dressing   Procedure completion:  Tolerated well, no immediate complications     ____________________________________________   INITIAL IMPRESSION / ASSESSMENT AND PLAN / ED COURSE  As part of my medical decision making, I reviewed the following data within the electronic MEDICAL RECORD NUMBER         Patient presents with skin tear follow-up bleeding to the posterior left lower leg.  See procedure note for closure.  Patient given discharge care instruction advised follow-up PCP.  Return to ED if condition worsens.      ____________________________________________   FINAL CLINICAL  IMPRESSION(S) / ED DIAGNOSES  Final diagnoses:  Noninfected skin tear of left lower extremity, initial encounter     ED Discharge Orders    None      *Please note:  Christy Cisneros was evaluated in Emergency Department on 11/02/2020 for the symptoms described in the history of present illness. She was evaluated in the context of the global COVID-19 pandemic, which necessitated consideration that the patient might be at risk for infection with the SARS-CoV-2 virus that causes COVID-19. Institutional protocols and algorithms that pertain to the evaluation of patients at risk for COVID-19 are in a state of rapid change based on information released by regulatory bodies including the CDC and federal and state organizations. These policies and algorithms were followed during the patient's care in the ED.  Some ED evaluations and interventions may be delayed as a result of limited staffing during and the pandemic.*   Note:  This document was prepared using Dragon voice recognition software and may include unintentional dictation errors.    Sable Feil, PA-C 11/02/20 1008    Blake Divine, MD 11/03/20 [phone]

## 2020-11-02 NOTE — ED Triage Notes (Signed)
Pt brought into the ED by her neighbor with c/o hitting the back of her left lower leg on the step at home and tearing the skin, saline gauze applied, bleeding is controlled.

## 2020-11-02 NOTE — ED Notes (Signed)
See triage note  Presents with laceration to left lower leg  States she hit it on step  Skin tear to back of leg

## 2020-11-02 NOTE — Discharge Instructions (Signed)
Follow discharge care instructions.  No creams or ointment to Dermabond area.  Change dressing daily.

## 2020-11-03 DIAGNOSIS — M810 Age-related osteoporosis without current pathological fracture: Secondary | ICD-10-CM | POA: Diagnosis not present

## 2020-11-10 DIAGNOSIS — H10232 Serous conjunctivitis, except viral, left eye: Secondary | ICD-10-CM | POA: Diagnosis not present

## 2020-11-10 DIAGNOSIS — H1820 Unspecified corneal edema: Secondary | ICD-10-CM | POA: Diagnosis not present

## 2020-11-10 DIAGNOSIS — H401433 Capsular glaucoma with pseudoexfoliation of lens, bilateral, severe stage: Secondary | ICD-10-CM | POA: Diagnosis not present

## 2020-11-10 DIAGNOSIS — H18232 Secondary corneal edema, left eye: Secondary | ICD-10-CM | POA: Diagnosis not present

## 2020-11-10 DIAGNOSIS — H353132 Nonexudative age-related macular degeneration, bilateral, intermediate dry stage: Secondary | ICD-10-CM | POA: Diagnosis not present

## 2020-11-13 ENCOUNTER — Other Ambulatory Visit: Payer: Self-pay

## 2020-11-13 ENCOUNTER — Emergency Department: Payer: Medicare Other

## 2020-11-13 ENCOUNTER — Encounter: Payer: Self-pay | Admitting: Emergency Medicine

## 2020-11-13 ENCOUNTER — Inpatient Hospital Stay
Admission: EM | Admit: 2020-11-13 | Discharge: 2020-11-17 | DRG: 089 | Disposition: A | Discharge status: Home Health | Payer: Medicare Other | Attending: Family Medicine | Admitting: Family Medicine

## 2020-11-13 DIAGNOSIS — K2270 Barrett's esophagus without dysplasia: Secondary | ICD-10-CM | POA: Diagnosis present

## 2020-11-13 DIAGNOSIS — Z96612 Presence of left artificial shoulder joint: Secondary | ICD-10-CM | POA: Diagnosis not present

## 2020-11-13 DIAGNOSIS — R410 Disorientation, unspecified: Secondary | ICD-10-CM | POA: Diagnosis not present

## 2020-11-13 DIAGNOSIS — M47812 Spondylosis without myelopathy or radiculopathy, cervical region: Secondary | ICD-10-CM | POA: Diagnosis not present

## 2020-11-13 DIAGNOSIS — Z881 Allergy status to other antibiotic agents status: Secondary | ICD-10-CM

## 2020-11-13 DIAGNOSIS — M810 Age-related osteoporosis without current pathological fracture: Secondary | ICD-10-CM | POA: Diagnosis present

## 2020-11-13 DIAGNOSIS — Z66 Do not resuscitate: Secondary | ICD-10-CM | POA: Diagnosis present

## 2020-11-13 DIAGNOSIS — M4312 Spondylolisthesis, cervical region: Secondary | ICD-10-CM | POA: Diagnosis not present

## 2020-11-13 DIAGNOSIS — H409 Unspecified glaucoma: Secondary | ICD-10-CM | POA: Diagnosis present

## 2020-11-13 DIAGNOSIS — Z8673 Personal history of transient ischemic attack (TIA), and cerebral infarction without residual deficits: Secondary | ICD-10-CM

## 2020-11-13 DIAGNOSIS — S060X9A Concussion with loss of consciousness of unspecified duration, initial encounter: Secondary | ICD-10-CM | POA: Diagnosis not present

## 2020-11-13 DIAGNOSIS — W19XXXA Unspecified fall, initial encounter: Secondary | ICD-10-CM | POA: Diagnosis not present

## 2020-11-13 DIAGNOSIS — E7800 Pure hypercholesterolemia, unspecified: Secondary | ICD-10-CM | POA: Diagnosis present

## 2020-11-13 DIAGNOSIS — F0390 Unspecified dementia without behavioral disturbance: Secondary | ICD-10-CM | POA: Diagnosis present

## 2020-11-13 DIAGNOSIS — F32A Depression, unspecified: Secondary | ICD-10-CM | POA: Diagnosis present

## 2020-11-13 DIAGNOSIS — S32501A Unspecified fracture of right pubis, initial encounter for closed fracture: Secondary | ICD-10-CM | POA: Diagnosis not present

## 2020-11-13 DIAGNOSIS — K219 Gastro-esophageal reflux disease without esophagitis: Secondary | ICD-10-CM | POA: Diagnosis present

## 2020-11-13 DIAGNOSIS — F0280 Dementia in other diseases classified elsewhere without behavioral disturbance: Secondary | ICD-10-CM | POA: Diagnosis not present

## 2020-11-13 DIAGNOSIS — Z20822 Contact with and (suspected) exposure to covid-19: Secondary | ICD-10-CM | POA: Diagnosis present

## 2020-11-13 DIAGNOSIS — R072 Precordial pain: Secondary | ICD-10-CM | POA: Diagnosis not present

## 2020-11-13 DIAGNOSIS — Z6820 Body mass index (BMI) 20.0-20.9, adult: Secondary | ICD-10-CM

## 2020-11-13 DIAGNOSIS — G309 Alzheimer's disease, unspecified: Secondary | ICD-10-CM | POA: Diagnosis present

## 2020-11-13 DIAGNOSIS — R64 Cachexia: Secondary | ICD-10-CM | POA: Diagnosis not present

## 2020-11-13 DIAGNOSIS — Z043 Encounter for examination and observation following other accident: Secondary | ICD-10-CM | POA: Diagnosis not present

## 2020-11-13 DIAGNOSIS — R0781 Pleurodynia: Secondary | ICD-10-CM | POA: Diagnosis not present

## 2020-11-13 DIAGNOSIS — Z885 Allergy status to narcotic agent status: Secondary | ICD-10-CM

## 2020-11-13 DIAGNOSIS — R030 Elevated blood-pressure reading, without diagnosis of hypertension: Secondary | ICD-10-CM | POA: Diagnosis present

## 2020-11-13 DIAGNOSIS — N179 Acute kidney failure, unspecified: Secondary | ICD-10-CM | POA: Diagnosis present

## 2020-11-13 DIAGNOSIS — W07XXXA Fall from chair, initial encounter: Secondary | ICD-10-CM | POA: Diagnosis present

## 2020-11-13 DIAGNOSIS — G4730 Sleep apnea, unspecified: Secondary | ICD-10-CM | POA: Diagnosis present

## 2020-11-13 DIAGNOSIS — Z888 Allergy status to other drugs, medicaments and biological substances status: Secondary | ICD-10-CM

## 2020-11-13 DIAGNOSIS — R079 Chest pain, unspecified: Secondary | ICD-10-CM | POA: Diagnosis not present

## 2020-11-13 DIAGNOSIS — H5462 Unqualified visual loss, left eye, normal vision right eye: Secondary | ICD-10-CM | POA: Diagnosis present

## 2020-11-13 DIAGNOSIS — R296 Repeated falls: Secondary | ICD-10-CM | POA: Diagnosis present

## 2020-11-13 DIAGNOSIS — R54 Age-related physical debility: Secondary | ICD-10-CM | POA: Diagnosis present

## 2020-11-13 DIAGNOSIS — I959 Hypotension, unspecified: Secondary | ICD-10-CM | POA: Diagnosis present

## 2020-11-13 DIAGNOSIS — R451 Restlessness and agitation: Secondary | ICD-10-CM | POA: Diagnosis present

## 2020-11-13 DIAGNOSIS — Y92009 Unspecified place in unspecified non-institutional (private) residence as the place of occurrence of the external cause: Secondary | ICD-10-CM

## 2020-11-13 DIAGNOSIS — R0789 Other chest pain: Secondary | ICD-10-CM | POA: Diagnosis not present

## 2020-11-13 DIAGNOSIS — Z79899 Other long term (current) drug therapy: Secondary | ICD-10-CM

## 2020-11-13 DIAGNOSIS — I700 Atherosclerosis of aorta: Secondary | ICD-10-CM | POA: Diagnosis not present

## 2020-11-13 DIAGNOSIS — R4189 Other symptoms and signs involving cognitive functions and awareness: Secondary | ICD-10-CM | POA: Diagnosis present

## 2020-11-13 DIAGNOSIS — S81819A Laceration without foreign body, unspecified lower leg, initial encounter: Secondary | ICD-10-CM | POA: Diagnosis present

## 2020-11-13 LAB — CBC WITH DIFFERENTIAL/PLATELET
Abs Immature Granulocytes: 0.05 10*3/uL (ref 0.00–0.07)
Basophils Absolute: 0.1 10*3/uL (ref 0.0–0.1)
Basophils Relative: 1 %
Eosinophils Absolute: 0.1 10*3/uL (ref 0.0–0.5)
Eosinophils Relative: 2 %
HCT: 40.8 % (ref 36.0–46.0)
Hemoglobin: 13.4 g/dL (ref 12.0–15.0)
Immature Granulocytes: 1 %
Lymphocytes Relative: 18 %
Lymphs Abs: 1.4 10*3/uL (ref 0.7–4.0)
MCH: 30.2 pg (ref 26.0–34.0)
MCHC: 32.8 g/dL (ref 30.0–36.0)
MCV: 92.1 fL (ref 80.0–100.0)
Monocytes Absolute: 0.6 10*3/uL (ref 0.1–1.0)
Monocytes Relative: 8 %
Neutro Abs: 5.6 10*3/uL (ref 1.7–7.7)
Neutrophils Relative %: 70 %
Platelets: 260 10*3/uL (ref 150–400)
RBC: 4.43 MIL/uL (ref 3.87–5.11)
RDW: 13.9 % (ref 11.5–15.5)
WBC: 7.8 10*3/uL (ref 4.0–10.5)
nRBC: 0 % (ref 0.0–0.2)

## 2020-11-13 LAB — BASIC METABOLIC PANEL
Anion gap: 9 (ref 5–15)
BUN: 35 mg/dL — ABNORMAL HIGH (ref 8–23)
CO2: 24 mmol/L (ref 22–32)
Calcium: 9.3 mg/dL (ref 8.9–10.3)
Chloride: 104 mmol/L (ref 98–111)
Creatinine, Ser: 0.95 mg/dL (ref 0.44–1.00)
GFR, Estimated: 54 mL/min — ABNORMAL LOW (ref >60)
Glucose, Bld: 82 mg/dL (ref 70–99)
Potassium: 4.6 mmol/L (ref 3.5–5.1)
Sodium: 137 mmol/L (ref 135–145)

## 2020-11-13 LAB — TROPONIN I (HIGH SENSITIVITY)
Troponin I (High Sensitivity): 39 ng/L — ABNORMAL HIGH (ref <18)
Troponin I (High Sensitivity): 36 ng/L — ABNORMAL HIGH (ref <18)

## 2020-11-13 MED ORDER — LORAZEPAM 2 MG/ML IJ SOLN: 2.0000 mg | Freq: Once | INTRAMUSCULAR | Status: AC

## 2020-11-13 MED ORDER — HALOPERIDOL LACTATE 5 MG/ML IJ SOLN
2.0000 mg | Freq: Once | INTRAMUSCULAR | Status: AC
  Administered 2020-11-13: 2 mg via INTRAVENOUS
  Filled 2020-11-13: qty 1

## 2020-11-13 MED ORDER — LORAZEPAM 2 MG/ML IJ SOLN
INTRAMUSCULAR | Status: AC
  Administered 2020-11-13: 2 mg via INTRAMUSCULAR
  Filled 2020-11-13: qty 1

## 2020-11-13 NOTE — Discharge Instructions (Addendum)
Please seek medical attention for any high fevers, chest pain, shortness of breath, change in behavior, persistent vomiting, bloody stool or any other new or concerning symptoms.  

## 2020-11-13 NOTE — ED Provider Notes (Signed)
[facility] Emergency Department Provider Note   ____________________________________________   I have reviewed the triage vital signs and the nursing notes.   HISTORY  Chief Complaint Fall   History limited by and level 5 caveat due to: Dementia  HPI Christy Cisneros is a 87 y.o. female who presents to the emergency department today for an apparent fall. The patient is unable to give any history and is unsure why she is here in the emergency department. Apparently the patient did have some complaint of chest pain shortly after the fall but does not complain of any pain to myself.   Records reviewed. Per medical record review patient has a history of memory change.  Past Medical History:  Diagnosis Date  . Barrett's esophagus with esophagitis   . Chronic bronchitis ([place])   . Depression   . GERD (gastroesophageal reflux disease)   . Glaucoma   . Hypercholesteremia   . Osteoarthritis   . Osteoporosis   . Sleep apnea    has CPAP  . TIA (transient ischemic attack)     Patient Active Problem List   Diagnosis Date Noted  . Encounter for completion of form with patient 10/10/2020  . Toe pain 06/07/2020  . Weight loss 03/25/2019  . Pain due to onychomycosis of toenails of both feet 03/11/2019  . Depression 05/06/2018  . Abrasion 02/28/2018  . History of CVA (cerebrovascular accident) 03/16/2017  . Memory change 09/04/2016  . Pneumonia 07/17/2016  . Mild cognitive impairment 05/15/2016  . Dizziness 04/21/2016  . Neck pain 02/11/2016  . Muscle cramps 11/14/2015  . Urine incontinence 08/16/2015  . Hematoma 08/16/2015  . Weakness 07/12/2015  . Headache 06/16/2015  . Unsteady gait 06/01/2015  . GERD (gastroesophageal reflux disease) 08/11/2014  . DYSPNEA 04/17/2009  . HYPERCHOLESTEROLEMIA 04/16/2009  . Obstructive sleep apnea 04/16/2009  . Glaucoma 04/16/2009  . Transient cerebral ischemia 04/16/2009  . BARRETTS ESOPHAGUS 04/16/2009  .  OSTEOARTHRITIS 04/16/2009  . Osteoporosis 04/16/2009    Past Surgical History:  Procedure Laterality Date  . BUNIONECTOMY     left  . ROTATOR CUFF REPAIR     right    Prior to Admission medications   Medication Sig Start Date End Date Taking? Authorizing Provider  acetaminophen (TYLENOL) 325 MG tablet Take 2 tablets (650 mg total) by mouth every 8 (eight) hours as needed for mild pain. 07/10/18   Jodelle Green, FNP  mupirocin ointment (BACTROBAN) 2 % Apply to affected area on toe bid 10/08/20   Einar Pheasant, MD  nystatin cream (MYCOSTATIN) Apply 1 application topically 2 (two) times daily. Under the right breast 08/22/18   Einar Pheasant, MD  omeprazole (PRILOSEC) 20 MG capsule TAKE ONE CAPSULE TWICE A DAY BEFORE MEALS 11/17/16   Einar Pheasant, MD  tobramycin (TOBREX) 0.3 % ophthalmic solution  11/02/17   [provider]    Allergies Bacitracin, Cephalexin, Codeine, Avelox [moxifloxacin hcl in nacl], Cefdinir, Clarithromycin, Hydrocodone-acetaminophen, Lansoprazole, Polymyxin b, Vigamox [moxifloxacin], Brimonidine, and Erythromycin  Family History  Problem Relation Age of Onset  . Breast cancer Sister   . Heart disease Mother   . Alcohol abuse Father     Social History Social History   Tobacco Use  . Smoking status: Never Smoker  . Smokeless tobacco: Never Used  Vaping Use  . Vaping Use: Never used  Substance Use Topics  . Alcohol use: Yes    Alcohol/week: 0.0 standard drinks    Comment: wine/brandy occasional  . Drug use: No  Review of Systems Unable to obtain reliable ROS secondary to dementia.  ____________________________________________   PHYSICAL EXAM:  VITAL SIGNS: ED Triage Vitals  Enc Vitals Group     BP 11/13/20 1521 (!) 146/89     Pulse Rate 11/13/20 1521 80     Resp 11/13/20 1521 20     Temp 11/13/20 1521 98.3 F (36.8 C)     Temp Source 11/13/20 1521 Oral     SpO2 11/13/20 1521 100 %     Weight 11/13/20 1522 100 lb (45.4 kg)      Height 11/13/20 1522 4\' 11"  (1.499 m)   Constitutional: Awake and alert. Not oriented.  Eyes: Conjunctivae are normal.  ENT      Head: Normocephalic and atraumatic.      Nose: No congestion/rhinnorhea.      Mouth/Throat: Mucous membranes are moist.      Neck: No stridor. No midline tenderness.  Hematological/Lymphatic/Immunilogical: No cervical lymphadenopathy. Cardiovascular: Normal rate, regular rhythm.  No murmurs, rubs, or gallops.  Respiratory: Normal respiratory effort without tachypnea nor retractions. Breath sounds are clear and equal bilaterally. No wheezes/rales/rhonchi. Gastrointestinal: Soft and non tender. No rebound. No guarding.  Genitourinary: Deferred Musculoskeletal: Normal range of motion in all extremities. Bandage over her left lower leg. No hip tenderness to palpation or manipulation.  Neurologic:  Not oriented. Moving all extremities.  Skin:  Skin is warm, dry and intact. No rash noted. Psychiatric: Mood and affect are normal. Speech and behavior are normal. Patient exhibits appropriate insight and judgment.  ____________________________________________    LABS (pertinent positives/negatives)  Trop hs 39 to 36 CBC wbc 7.8, hgb 13.4, plt 260 BMP wnl except bun 35 ____________________________________________   EKG  I, Nance Pear, attending physician, personally viewed and interpreted this EKG  EKG Time: 1954 Rate: 68 Rhythm: sinus rhythm Axis: normal Intervals: qtc 476 QRS: narrow, q waves v1 ST changes: no st elevation Impression: abnormal ekg ____________________________________________    RADIOLOGY  CT head/cervical spine No acute osseous abnormality. No acute intracranial abnormality  CXR No fracture. No acute abnormality.   ____________________________________________   PROCEDURES  Procedures  ____________________________________________   INITIAL IMPRESSION / ASSESSMENT AND PLAN / ED COURSE  Pertinent labs & imaging  results that were available during my care of the patient were reviewed by me and considered in my medical decision making (see chart for details).   Patient presented to the emergency department today because of concern for a fall. At one time she apparently was complaining of chest pain but had not complaint of chest pain on my exam. History and exam were limited secondary to dementia. Work up with slight elevation of troponin however repeat was improving. Doubt that this signifies acs. Imaging without any acute traumatic or concerning findings. While here the patient did get quite agitated which I think is likely secondary to dementia. She did necessitate medication to help keep her calm to protect herself and staff, and so we could obtain repeat blood work.  ____________________________________________   FINAL CLINICAL IMPRESSION(S) / ED DIAGNOSES  Final diagnoses:  Fall, initial encounter     Note: This dictation was prepared with Diplomatic Services operational officer dictation. Any transcriptional errors that result from this process are unintentional     Nance Pear, MD 11/13/20 [phone]

## 2020-11-13 NOTE — ED Notes (Signed)
Sitter at bedside. Pt is sleeping at this time.

## 2020-11-13 NOTE — ED Triage Notes (Signed)
Pt to ED via [facility] from home with c/o fall out of a chair. Per EMS pt was ambulatory after the fall. Per EMS pt c/o sternal pain that radiates to R ribs and back pain. Pt appears confused on arrival. Pt states pain to sternal area when she tries to sit up.    82HR 98% RA 154/77

## 2020-11-13 NOTE — ED Notes (Signed)
RN attempted to place pt on bedpan. Pt stated she could no longer go to the bathroom. Will attempt again when pt says she needs to go.

## 2020-11-13 NOTE — ED Notes (Signed)
Pt was attempting to get out of bed. RN and tech attempted to pull pt up in the bed. Pt began yelling and getting physically aggressive and swinging at staff.

## 2020-11-14 ENCOUNTER — Encounter: Payer: Self-pay | Admitting: Internal Medicine

## 2020-11-14 ENCOUNTER — Emergency Department: Payer: Medicare Other

## 2020-11-14 DIAGNOSIS — F0390 Unspecified dementia without behavioral disturbance: Secondary | ICD-10-CM

## 2020-11-14 DIAGNOSIS — E7800 Pure hypercholesterolemia, unspecified: Secondary | ICD-10-CM | POA: Diagnosis present

## 2020-11-14 DIAGNOSIS — W19XXXA Unspecified fall, initial encounter: Secondary | ICD-10-CM | POA: Diagnosis present

## 2020-11-14 DIAGNOSIS — Z79899 Other long term (current) drug therapy: Secondary | ICD-10-CM | POA: Diagnosis not present

## 2020-11-14 DIAGNOSIS — R64 Cachexia: Secondary | ICD-10-CM | POA: Diagnosis present

## 2020-11-14 DIAGNOSIS — W07XXXA Fall from chair, initial encounter: Secondary | ICD-10-CM | POA: Diagnosis present

## 2020-11-14 DIAGNOSIS — N179 Acute kidney failure, unspecified: Secondary | ICD-10-CM | POA: Diagnosis present

## 2020-11-14 DIAGNOSIS — K219 Gastro-esophageal reflux disease without esophagitis: Secondary | ICD-10-CM | POA: Diagnosis present

## 2020-11-14 DIAGNOSIS — R451 Restlessness and agitation: Secondary | ICD-10-CM | POA: Diagnosis present

## 2020-11-14 DIAGNOSIS — R4189 Other symptoms and signs involving cognitive functions and awareness: Secondary | ICD-10-CM | POA: Diagnosis present

## 2020-11-14 DIAGNOSIS — F0280 Dementia in other diseases classified elsewhere without behavioral disturbance: Secondary | ICD-10-CM | POA: Diagnosis present

## 2020-11-14 DIAGNOSIS — Z888 Allergy status to other drugs, medicaments and biological substances status: Secondary | ICD-10-CM | POA: Diagnosis not present

## 2020-11-14 DIAGNOSIS — H409 Unspecified glaucoma: Secondary | ICD-10-CM

## 2020-11-14 DIAGNOSIS — G309 Alzheimer's disease, unspecified: Secondary | ICD-10-CM | POA: Diagnosis present

## 2020-11-14 DIAGNOSIS — K2270 Barrett's esophagus without dysplasia: Secondary | ICD-10-CM | POA: Diagnosis present

## 2020-11-14 DIAGNOSIS — S060X9A Concussion with loss of consciousness of unspecified duration, initial encounter: Secondary | ICD-10-CM | POA: Diagnosis present

## 2020-11-14 DIAGNOSIS — Z8673 Personal history of transient ischemic attack (TIA), and cerebral infarction without residual deficits: Secondary | ICD-10-CM | POA: Diagnosis not present

## 2020-11-14 DIAGNOSIS — S81819A Laceration without foreign body, unspecified lower leg, initial encounter: Secondary | ICD-10-CM | POA: Diagnosis present

## 2020-11-14 DIAGNOSIS — S32501A Unspecified fracture of right pubis, initial encounter for closed fracture: Secondary | ICD-10-CM | POA: Diagnosis not present

## 2020-11-14 DIAGNOSIS — Z66 Do not resuscitate: Secondary | ICD-10-CM | POA: Diagnosis present

## 2020-11-14 DIAGNOSIS — H5462 Unqualified visual loss, left eye, normal vision right eye: Secondary | ICD-10-CM | POA: Diagnosis present

## 2020-11-14 DIAGNOSIS — W19XXXD Unspecified fall, subsequent encounter: Secondary | ICD-10-CM | POA: Diagnosis not present

## 2020-11-14 DIAGNOSIS — R0789 Other chest pain: Secondary | ICD-10-CM | POA: Diagnosis not present

## 2020-11-14 DIAGNOSIS — Z6820 Body mass index (BMI) 20.0-20.9, adult: Secondary | ICD-10-CM | POA: Diagnosis not present

## 2020-11-14 DIAGNOSIS — R54 Age-related physical debility: Secondary | ICD-10-CM | POA: Diagnosis present

## 2020-11-14 DIAGNOSIS — Z881 Allergy status to other antibiotic agents status: Secondary | ICD-10-CM | POA: Diagnosis not present

## 2020-11-14 DIAGNOSIS — Y92009 Unspecified place in unspecified non-institutional (private) residence as the place of occurrence of the external cause: Secondary | ICD-10-CM | POA: Diagnosis not present

## 2020-11-14 DIAGNOSIS — F32A Depression, unspecified: Secondary | ICD-10-CM | POA: Diagnosis present

## 2020-11-14 DIAGNOSIS — Z885 Allergy status to narcotic agent status: Secondary | ICD-10-CM | POA: Diagnosis not present

## 2020-11-14 DIAGNOSIS — M47812 Spondylosis without myelopathy or radiculopathy, cervical region: Secondary | ICD-10-CM | POA: Diagnosis present

## 2020-11-14 DIAGNOSIS — Z043 Encounter for examination and observation following other accident: Secondary | ICD-10-CM | POA: Diagnosis not present

## 2020-11-14 DIAGNOSIS — Z20822 Contact with and (suspected) exposure to covid-19: Secondary | ICD-10-CM | POA: Diagnosis present

## 2020-11-14 DIAGNOSIS — R296 Repeated falls: Secondary | ICD-10-CM | POA: Diagnosis present

## 2020-11-14 LAB — URINALYSIS, COMPLETE (UACMP) WITH MICROSCOPIC
Bacteria, UA: NONE SEEN
Bilirubin Urine: NEGATIVE
Glucose, UA: NEGATIVE mg/dL
Hgb urine dipstick: NEGATIVE
Ketones, ur: NEGATIVE mg/dL
Leukocytes,Ua: NEGATIVE
Nitrite: NEGATIVE
Protein, ur: NEGATIVE mg/dL
Specific Gravity, Urine: 1.014 (ref 1.005–1.030)
Squamous Epithelial / HPF: NONE SEEN (ref 0–5)
pH: 7 (ref 5.0–8.0)

## 2020-11-14 LAB — HEPATIC FUNCTION PANEL
ALT: 18 U/L (ref 0–44)
AST: 27 U/L (ref 15–41)
Albumin: 4.1 g/dL (ref 3.5–5.0)
Alkaline Phosphatase: 48 U/L (ref 38–126)
Bilirubin, Direct: 0.1 mg/dL (ref 0.0–0.2)
Indirect Bilirubin: 0.6 mg/dL (ref 0.3–0.9)
Total Bilirubin: 0.7 mg/dL (ref 0.3–1.2)
Total Protein: 7 g/dL (ref 6.5–8.1)

## 2020-11-14 LAB — TSH: TSH: 3.275 u[IU]/mL (ref 0.350–4.500)

## 2020-11-14 LAB — SARS CORONAVIRUS 2 (TAT 6-24 HRS): SARS Coronavirus 2: NEGATIVE

## 2020-11-14 MED ORDER — ONDANSETRON HCL 4 MG/2ML IJ SOLN: 4.0000 mg | Freq: Four times a day (QID) | INTRAMUSCULAR | Status: DC | PRN

## 2020-11-14 MED ORDER — ONDANSETRON HCL 4 MG PO TABS: 4.0000 mg | ORAL_TABLET | Freq: Four times a day (QID) | ORAL | Status: DC | PRN

## 2020-11-14 MED ORDER — ACETAMINOPHEN 325 MG PO TABS
650.0000 mg | ORAL_TABLET | Freq: Four times a day (QID) | ORAL | Status: DC | PRN
  Administered 2020-11-16: 650 mg via ORAL
  Filled 2020-11-14: qty 2

## 2020-11-14 MED ORDER — ACETAMINOPHEN 325 MG PO TABS: 650.0000 mg | ORAL_TABLET | Freq: Three times a day (TID) | ORAL | Status: DC | PRN

## 2020-11-14 MED ORDER — PANTOPRAZOLE SODIUM 40 MG PO TBEC
40.0000 mg | DELAYED_RELEASE_TABLET | Freq: Every day | ORAL | Status: DC
  Administered 2020-11-15 – 2020-11-17 (×3): 40 mg via ORAL
  Filled 2020-11-14 (×3): qty 1

## 2020-11-14 MED ORDER — SODIUM CHLORIDE 0.9 % IV SOLN: INTRAVENOUS | Status: AC

## 2020-11-14 MED ORDER — ENOXAPARIN SODIUM 30 MG/0.3ML ~~LOC~~ SOLN
30.0000 mg | SUBCUTANEOUS | Status: DC
  Administered 2020-11-14 – 2020-11-16 (×3): 30 mg via SUBCUTANEOUS
  Filled 2020-11-14 (×3): qty 0.3

## 2020-11-14 MED ORDER — ACETAMINOPHEN 325 MG PO TABS: 650.0000 mg | ORAL_TABLET | Freq: Four times a day (QID) | ORAL | Status: DC | PRN

## 2020-11-14 MED ORDER — HYDRALAZINE HCL 20 MG/ML IJ SOLN
10.0000 mg | Freq: Four times a day (QID) | INTRAMUSCULAR | Status: DC | PRN
  Administered 2020-11-14 – 2020-11-16 (×2): 10 mg via INTRAVENOUS
  Filled 2020-11-14 (×2): qty 1

## 2020-11-14 NOTE — ED Notes (Signed)
Pt reminded not to get out of bed at this time. Fall alarm still on and in place at this time.

## 2020-11-14 NOTE — ED Notes (Signed)
Informed RN bed assigned 1206

## 2020-11-14 NOTE — Plan of Care (Signed)
  Problem: Education: Goal: Knowledge of General Education information will improve Description: Including pain rating scale, medication(s)/side effects and non-pharmacologic comfort measures Outcome: Progressing   Problem: Activity: Goal: Risk for activity intolerance will decrease Outcome: Progressing   Problem: Nutrition: Goal: Adequate nutrition will be maintained Outcome: Progressing   Problem: Coping: Goal: Level of anxiety will decrease Outcome: Progressing   Problem: Elimination: Goal: Will not experience complications related to bowel motility Outcome: Progressing   Problem: Safety: Goal: Ability to remain free from injury will improve Outcome: Progressing   Problem: Skin Integrity: Goal: Risk for impaired skin integrity will decrease Outcome: Progressing

## 2020-11-14 NOTE — ED Notes (Addendum)
I/O cath completed at this time. This RN and Lilia Pro EDT cleaned up pt and provided clean brief for pt at this time. Pt given warm blanket and scooted up in bed for comfort at this time. Pt tolerated well.   Fall alarm on and in place at this time. Call light within reach, pt reminded to call out with needs.

## 2020-11-14 NOTE — ED Notes (Signed)
MD Charlann Boxer at bedside. MD informed that pt is not able to stand to ambulate with pulse ox at this time.

## 2020-11-14 NOTE — ED Notes (Signed)
Pt reminded not to get out of bed at this time. Pt repositioned in bed for comfort by this RN and Renato Gails RN.   Fall alarm still on and in place at this time.

## 2020-11-14 NOTE — ED Notes (Signed)
Pt cleaned up and assisted into clean brief after episode of urinary incontinence at this time. Pt tolerated well. Pt given warm blanket at this time and reoriented to date, time, and situation.  Fall alarm still on and in place at this time.

## 2020-11-14 NOTE — ED Notes (Signed)
This RN and Lilia Pro EDT scooted pt up in bed for comfort and checked pt brief at this time. Pt noted to be clean and dry at this time.  Pt to be transported at this time by Lilia Pro EDT

## 2020-11-14 NOTE — H&P (Addendum)
History and Physical    Christy Cisneros JAS:[id number] DOB: Dec 01, 1919 DOA: 11/13/2020  PCP: Einar Pheasant, MD   Patient coming from: Home  I have personally briefly reviewed patient's old medical records in [place]  Chief Complaint: Fall Most of the history is obtained from ER notes as patient is unable to provide any history due to underlying dementia  HPI: Christy Cisneros is a 87 y.o. female with medical history significant for  TIA, osteoarthritis, glaucoma, GERD, depression who was brought into the ER by EMS after she fell.  Per EMS patient was ambulatory after the fall but complained of pain over the sternum, right ribs and back. While in the ER patient became agitated and aggressive and received a dose of Haldol and lorazepam. Patient was signed out to the emergency room provider couple of hours following her initial presentation to the ER.  She remains confused and is too weak to ambulate.  Imaging does not show an acute fracture.  Patient will be admitted to the hospital for further evaluation. I am unable to do a review of systems on this patient. Labs show sodium 137, potassium 4.6, chloride 104, bicarb 24, glucose 82, BUN 35, creatinine 0.95, calcium 9.3, troponin 39 >> 36, white count 7.8, hemoglobin 13.4, hematocrit 40.8, MCV 92.1, RDW 13.9, platelet count 260 CT scan of abdomen and pelvis shows  no acute findings within the abdomen or pelvis. No evidence of solid organ injury. No free fluid or abscess collection. No free intraperitoneal air. No osseous fracture or dislocation. Specifically, no fracture is identified within the osseous pelvis. Small RIGHT pleural effusion. Bibasilar atelectasis. Colonic diverticulosis without evidence of acute diverticulitis. Aortic Atherosclerosis  X-ray of the pelvis shows there is a subtle lucency with possible cortical discontinuity of the right inferior pubic rami, subtle fracture is not excluded. Degenerative joint  changes of bilateral hips and lumbar spine. Chest x-ray reviewed by me shows no acute abnormalities. Cervical spine CT shows no acute cervical spine fracture. Extensive multilevel spondylosis and facet hypertrophy. CT scan of the head without contrast shows no acute intracranial process. Stable chronic ischemic changes throughout the white matter, basal ganglia, and cerebellum. Twelve-lead EKG reviewed by me shows sinus rhythm.    ED Course: Patient is an elderly female who was brought into the ER by EMS for evaluation following a fall at home.  It is unclear what her baseline is but patient is confused and unable to ambulate. She will be referred to observation status for further evaluation.   Review of Systems: As per HPI otherwise all other systems reviewed and negative.    Past Medical History:  Diagnosis Date  . Barrett's esophagus with esophagitis   . Chronic bronchitis ([place])   . Depression   . GERD (gastroesophageal reflux disease)   . Glaucoma   . Hypercholesteremia   . Osteoarthritis   . Osteoporosis   . Sleep apnea    has CPAP  . TIA (transient ischemic attack)     Past Surgical History:  Procedure Laterality Date  . BUNIONECTOMY     left  . ROTATOR CUFF REPAIR     right     reports that she has never smoked. She has never used smokeless tobacco. She reports current alcohol use. She reports that she does not use drugs.  Allergies  Allergen Reactions  . Bacitracin Other (See Comments)    Tearing, itching, swelling, redness Other Reaction: tearing, swollen red eyes Tearing, itching, swelling, redness  .  Cephalexin Other (See Comments)    HA, nausea, gas, abdominal pain Other reaction(s): Abdominal Pain, Other (See Comments) Other Reaction: N&V HA, nausea, gas, abdominal pain  . Codeine Other (See Comments)    HA, nausea, gas, abdominal pain Other reaction(s): Abdominal Pain Other Reaction: N&V HA, nausea, gas, abdominal pain  . Avelox [Moxifloxacin Hcl  In Nacl] Other (See Comments)    Dizziness, weakness  . Cefdinir Nausea And Vomiting  . Clarithromycin     HA, nausea, gas, abdominal pain  . Hydrocodone-Acetaminophen     REACTION: chest pain, constipation, nausea  . Lansoprazole     REACTION: cramps, nausea, diarrhea  . Polymyxin B     Itching and redness  . Vigamox [Moxifloxacin] Itching    Soreness, irritability, loss of appetite,  Dizziness, diarrhea, muscle weakness  . Brimonidine Itching    Burning, stinging, bloody eye, dry mouth Burning, stinging, bloody eye, dry mouth Burning, stinging, bloody eye, dry mouth  . Erythromycin Rash    HA, nausea, gas, abdominal pain HA, nausea, gas, abdominal pain    Family History  Problem Relation Age of Onset  . Breast cancer Sister   . Heart disease Mother   . Alcohol abuse Father       Prior to Admission medications   Medication Sig Start Date End Date Taking? Authorizing Provider  acetaminophen (TYLENOL) 325 MG tablet Take 2 tablets (650 mg total) by mouth every 8 (eight) hours as needed for mild pain. 07/10/18   Jodelle Green, FNP  mupirocin ointment (BACTROBAN) 2 % Apply to affected area on toe bid 10/08/20   Einar Pheasant, MD  nystatin cream (MYCOSTATIN) Apply 1 application topically 2 (two) times daily. Under the right breast 08/22/18   Einar Pheasant, MD  omeprazole (PRILOSEC) 20 MG capsule TAKE ONE CAPSULE TWICE A DAY BEFORE MEALS 11/17/16   Einar Pheasant, MD  tobramycin (TOBREX) 0.3 % ophthalmic solution  11/02/17   [provider]    Physical Exam: Vitals:   11/14/20 0437 11/14/20 0754 11/14/20 0859 11/14/20 1000  BP: 134/79 (!) 144/88 (!) 144/85 137/82  Pulse: 72 73 73 61  Resp: 16 16 16 16   Temp:      TempSrc:      SpO2: 100% 100% 100% 99%  Weight:      Height:         Vitals:   11/14/20 0437 11/14/20 0754 11/14/20 0859 11/14/20 1000  BP: 134/79 (!) 144/88 (!) 144/85 137/82  Pulse: 72 73 73 61  Resp: 16 16 16 16   Temp:      TempSrc:       SpO2: 100% 100% 100% 99%  Weight:      Height:          Constitutional: Alert and oriented x 1 .  Only to person not to place or time.  Not in any apparent distress HEENT:      Head: Normocephalic and atraumatic.         Eyes: PERLA, EOMI, Conjunctivae are normal. Sclera is non-icteric.       Mouth/Throat: Mucous membranes are dry.       Neck: Supple with no signs of meningismus. Cardiovascular: Regular rate and rhythm. No murmurs, gallops, or rubs. 2+ symmetrical distal pulses are present . No JVD. No LE edema Respiratory: Respiratory effort normal .Lungs sounds clear bilaterally. No wheezes, crackles, or rhonchi.  Gastrointestinal: Soft, non tender, and non distended with positive bowel sounds. Scaphoid Genitourinary: No CVA tenderness. Musculoskeletal: Nontender with  normal range of motion in all extremities. No cyanosis, or erythema of extremities. Neurologic:  Face is symmetric. Moving all extremities. No gross focal neurologic deficits .  Moving all extremities Skin: Skin is warm, dry.  Ecchymosis over the right leg.  Dry skin Psychiatric: Unable to assess   Labs on Admission: I have personally reviewed following labs and imaging studies  CBC: Recent Labs  Lab 11/13/20 1555  WBC 7.8  NEUTROABS 5.6  HGB 13.4  HCT 40.8  MCV 92.1  PLT 937   Basic Metabolic Panel: Recent Labs  Lab 11/13/20 1555  NA 137  K 4.6  CL 104  CO2 24  GLUCOSE 82  BUN 35*  CREATININE 0.95  CALCIUM 9.3   GFR: Estimated Creatinine Clearance: 22.5 mL/min (by C-G formula based on SCr of 0.95 mg/dL). Liver Function Tests: No results for input(s): AST, ALT, ALKPHOS, BILITOT, PROT, ALBUMIN in the last 168 hours. No results for input(s): LIPASE, AMYLASE in the last 168 hours. No results for input(s): AMMONIA in the last 168 hours. Coagulation Profile: No results for input(s): INR, PROTIME in the last 168 hours. Cardiac Enzymes: No results for input(s): CKTOTAL, CKMB, CKMBINDEX, TROPONINI  in the last 168 hours. BNP (last 3 results) No results for input(s): PROBNP in the last 8760 hours. HbA1C: No results for input(s): HGBA1C in the last 72 hours. CBG: No results for input(s): GLUCAP in the last 168 hours. Lipid Profile: No results for input(s): CHOL, HDL, LDLCALC, TRIG, CHOLHDL, LDLDIRECT in the last 72 hours. Thyroid Function Tests: No results for input(s): TSH, T4TOTAL, FREET4, T3FREE, THYROIDAB in the last 72 hours. Anemia Panel: No results for input(s): VITAMINB12, FOLATE, FERRITIN, TIBC, IRON, RETICCTPCT in the last 72 hours. Urine analysis:    Component Value Date/Time   COLORURINE YELLOW (A) 11/13/2020 1539   APPEARANCEUR CLEAR (A) 11/13/2020 1539   APPEARANCEUR Clear 12/06/2012 1257   LABSPEC 1.014 11/13/2020 1539   LABSPEC 1.008 12/06/2012 1257   PHURINE 7.0 11/13/2020 1539   GLUCOSEU NEGATIVE 11/13/2020 1539   GLUCOSEU NEGATIVE 08/30/2017 1231   HGBUR NEGATIVE 11/13/2020 1539   BILIRUBINUR NEGATIVE 11/13/2020 1539   BILIRUBINUR neg 08/07/2017 1014   BILIRUBINUR Negative 12/06/2012 [place] 11/13/2020 1539   PROTEINUR NEGATIVE 11/13/2020 1539   UROBILINOGEN 0.2 08/30/2017 1231   NITRITE NEGATIVE 11/13/2020 1539   LEUKOCYTESUR NEGATIVE 11/13/2020 1539   LEUKOCYTESUR Negative 12/06/2012 1257    Radiological Exams on Admission: CT ABDOMEN PELVIS WO CONTRAST  Result Date: 11/14/2020 CLINICAL DATA:  Fall yesterday. Possible pelvic fracture on plain film. EXAM: CT ABDOMEN AND PELVIS WITHOUT CONTRAST TECHNIQUE: Multidetector CT imaging of the abdomen and pelvis was performed following the standard protocol without IV contrast. COMPARISON:  CT abdomen dated 11/01/2017. FINDINGS: Lower chest: Small RIGHT pleural effusion.  Bibasilar atelectasis. Hepatobiliary: No focal liver abnormality is seen. Gallbladder is mildly distended but otherwise unremarkable, characterization limited by patient motion artifact. No evidence of bile duct dilatation.  Pancreas: Parenchymal atrophy.  No peripancreatic fluid is seen. Spleen: Normal in size without focal abnormality. Adrenals/Urinary Tract: Adrenal glands are unremarkable. Kidneys are unremarkable without stone or hydronephrosis.z bladder appears normal. Stomach/Bowel: No dilated large or small bowel loops. No evidence of bowel wall inflammation. Appendix appears normal where seen. Scattered colonic diverticulosis but no focal inflammatory change to suggest acute diverticulitis. Stomach is unremarkable, partially decompressed. Vascular/Lymphatic: Aortic atherosclerosis. No enlarged lymph nodes are seen. Reproductive: Uterus and bilateral adnexa are unremarkable. Other: No free fluid or abscess collection  is seen. No free intraperitoneal air. Musculoskeletal: No osseous fracture is identified. Specifically, no evidence of acute fracture or osseous displacement within the pelvis. Pubic rami appear intact and normally aligned. Degenerative spondylosis of the scoliotic thoracolumbar spine, moderate in degree. IMPRESSION: 1. No acute findings within the abdomen or pelvis. No evidence of solid organ injury. No free fluid or abscess collection. No free intraperitoneal air. 2. No osseous fracture or dislocation. Specifically, no fracture is identified within the osseous pelvis. 3. Small RIGHT pleural effusion. Bibasilar atelectasis. 4. Colonic diverticulosis without evidence of acute diverticulitis. Aortic Atherosclerosis (ICD10-I70.0). Electronically Signed   By: Franki Cabot M.D.   On: 11/14/2020 10:02   DG Chest 1 View  Result Date: 11/13/2020 CLINICAL DATA:  [place] out of a chair, sternal pain radiating to RIGHT ribs and back EXAM: CHEST  1 VIEW COMPARISON:  Portable exam 1629 hours compared to 04/11/2019 FINDINGS: Rotated to the LEFT. Normal heart size, mediastinal contours, and pulmonary vascularity. Atherosclerotic calcification aorta. Lungs clear. No pleural effusion or pneumothorax. Bones demineralized with LEFT  shoulder prosthesis noted. IMPRESSION: No acute abnormalities. Electronically Signed   By: Lavonia Dana M.D.   On: 11/13/2020 16:48   CT Head Wo Contrast  Result Date: 11/13/2020 CLINICAL DATA:  [place] out of a chair, confusion EXAM: CT HEAD WITHOUT CONTRAST TECHNIQUE: Contiguous axial images were obtained from the base of the skull through the vertex without intravenous contrast. COMPARISON:  04/11/2019 FINDINGS: Brain: Stable chronic ischemic changes within the bilateral basal ganglia, bilateral cerebellar hemispheres, and diffusely throughout the bilateral periventricular white matter. No signs of acute infarct or hemorrhage. The lateral ventricles and midline structures are unremarkable. No acute extra-axial fluid collections. No mass effect. Vascular: No hyperdense vessel or unexpected calcification. Skull: Normal. Negative for fracture or focal lesion. Sinuses/Orbits: Stable postsurgical changes left orbit. Paranasal sinuses are clear. Other: None. IMPRESSION: 1. No acute intracranial process. 2. Stable chronic ischemic changes throughout the white matter, basal ganglia, and cerebellum. Electronically Signed   By: Randa Ngo M.D.   On: 11/13/2020 16:36   CT Cervical Spine Wo Contrast  Result Date: 11/13/2020 CLINICAL DATA:  [place] out of a chair, back pain EXAM: CT CERVICAL SPINE WITHOUT CONTRAST TECHNIQUE: Multidetector CT imaging of the cervical spine was performed without intravenous contrast. Multiplanar CT image reconstructions were also generated. COMPARISON:  02/11/2007 FINDINGS: Alignment: Mild anterolisthesis of C6 on C7. Otherwise alignment is anatomic. Skull base and vertebrae: No acute fracture. No primary bone lesion or focal pathologic process. Soft tissues and spinal canal: No prevertebral fluid or swelling. No visible canal hematoma. Disc levels: Prominent spondylosis at C5-6 and C6-7. There is diffuse facet hypertrophy. Marked hypertrophic changes at the C1-C2 interface. Upper chest:  Airways patent.  Lung apices are clear. Other: Reconstructed images demonstrate no additional findings. IMPRESSION: 1. No acute cervical spine fracture. 2. Extensive multilevel spondylosis and facet hypertrophy. Electronically Signed   By: Randa Ngo M.D.   On: 11/13/2020 16:39   DG Pelvis Portable  Result Date: 11/14/2020 CLINICAL DATA:  Status post fall, cannot walk. EXAM: PORTABLE PELVIS 1-2 VIEWS COMPARISON:  Lumbar x-ray August 30, 2017 FINDINGS: There is a subtle lucency with possible cortical discontinuity of the right inferior pubic rami, subtle fracture is not excluded. Chest degenerative joint changes of bilateral hips with narrowed joint space and osteophyte formation are noted. Degenerative joint changes of lumbar spine are noted. IMPRESSION: 1. There is a subtle lucency with possible cortical discontinuity of the right inferior pubic rami,  subtle fracture is not excluded. 2. Degenerative joint changes of bilateral hips and lumbar spine. Electronically Signed   By: Abelardo Diesel M.D.   On: 11/14/2020 09:04     Assessment/Plan Principal Problem:   Fall Active Problems:   Glaucoma   GERD (gastroesophageal reflux disease)   History of CVA (cerebrovascular accident)   Depression   Dementia ([place])    Status post fall Patient is an elderly female who lives alone and has care givers who come in for a couple of hours a day. She appears very weak and frail and remains a high fall risk She will be placed in observation status We will request physical therapy consult as well as case management consult for safe discharge disposition Place patient on fall precautions    GERD Continue Protonix     DVT prophylaxis: Lovenox Code Status: DO NOT RESUSCITATE Family Communication: Greater than 50% of time was spent discussing patient's condition and plan of care with Rhunette Croft (friend) over the phone.  She was able to provide a number for patient's niece Coralyn Mark [phone]. Spoke  to patient's niece Coralyn Mark over the phone.  Informed her of patient's condition and plan of care.  CODE STATUS was discussed and patient is a DNR.  She verbalizes understanding and agrees with the plan. Disposition Plan: Back to previous home environment Consults called: Physical therapy/case management Status: Observation    Sheri Prows MD [facility]     11/14/2020, 11:53 AM

## 2020-11-14 NOTE — ED Notes (Signed)
Rudy Jew updated on pt status at this time

## 2020-11-14 NOTE — ED Provider Notes (Signed)
I seen care of this patient approximately 0 700.  Please see outgoing providers note for full details regarding patient's initial evaluation and assessment.  In brief patient presented yesterday from home where she lives alone after she had a fall.  Patient is somewhat confused on arrival unable for any history.  Initial work-up is unremarkable although patient did have worsening confusion overnight and received Haldol and Ativan.  This morning after she has had time to metabolize on my assessment she is still confused and too weak to stand trial of ambulate.  She has no significant hip tenderness or pain on range of motion plain film of the pelvis is possible.  Femur fracture.  Otherwise work-up including CT head, C-spine, chest x-ray labs grossly unremarkable.  Pelvis x-ray obtained due to patient unable to stand or bear weight or ambulate on trial of ambulation.  This showed possible rami fracture although on Noncon CT no acute injuries noted.  Given patient is unable to ambulate and mildly confused with concern for possible concussion versus progression of previously noted mild cog impairment I do believe she will require admission for further evaluation and management and arrangement of safe disposition.   Lucrezia Starch, MD 11/14/20 [phone]

## 2020-11-15 DIAGNOSIS — W19XXXD Unspecified fall, subsequent encounter: Secondary | ICD-10-CM

## 2020-11-15 DIAGNOSIS — F32A Depression, unspecified: Secondary | ICD-10-CM

## 2020-11-15 LAB — BASIC METABOLIC PANEL
Anion gap: 11 (ref 5–15)
BUN: 17 mg/dL (ref 8–23)
CO2: 22 mmol/L (ref 22–32)
Calcium: 8.7 mg/dL — ABNORMAL LOW (ref 8.9–10.3)
Chloride: 104 mmol/L (ref 98–111)
Creatinine, Ser: 0.84 mg/dL (ref 0.44–1.00)
GFR, Estimated: >60 mL/min (ref >60)
Glucose, Bld: 97 mg/dL (ref 70–99)
Potassium: 3.9 mmol/L (ref 3.5–5.1)
Sodium: 137 mmol/L (ref 135–145)

## 2020-11-15 LAB — CBC
HCT: 43.8 % (ref 36.0–46.0)
Hemoglobin: 14.5 g/dL (ref 12.0–15.0)
MCH: 30.5 pg (ref 26.0–34.0)
MCHC: 33.1 g/dL (ref 30.0–36.0)
MCV: 92 fL (ref 80.0–100.0)
Platelets: 298 10*3/uL (ref 150–400)
RBC: 4.76 MIL/uL (ref 3.87–5.11)
RDW: 13.9 % (ref 11.5–15.5)
WBC: 8.9 10*3/uL (ref 4.0–10.5)
nRBC: 0 % (ref 0.0–0.2)

## 2020-11-15 MED ORDER — POLYETHYLENE GLYCOL 3350 17 G PO PACK
17.0000 g | PACK | Freq: Every day | ORAL | Status: DC
  Administered 2020-11-15 – 2020-11-17 (×3): 17 g via ORAL
  Filled 2020-11-15 (×3): qty 1

## 2020-11-15 MED ORDER — TOBRAMYCIN 0.3 % OP SOLN
1.0000 [drp] | OPHTHALMIC | Status: DC
  Administered 2020-11-15 – 2020-11-17 (×8): 1 [drp] via OPHTHALMIC
  Filled 2020-11-15: qty 5

## 2020-11-15 NOTE — Progress Notes (Signed)
PROGRESS NOTE   Christy Cisneros  HGD:[id number] DOB: 11-18-1919 DOA: 11/13/2020 PCP: Einar Pheasant, MD  Brief Narrative:   87 year old black female community dwelling History of Barrett's esophagus with esophagitis depression reflux Presented initially for 11/02/2020 with fall laceration to leg Represented to [facility] triage 4/16 with fall out of chair-Per EMS complained of sternal pain-->right rib back pain and was confused on arrival-received Haldol Ativan-too weak to stand ambulation was difficult x-rays performed show possible ramus fracture although CT scan did not confirm the same It was felt she had possible concussion versus cognitive impairment by emergency physician BUNs/creatinine 35/0.9, troponin 39-->36 White count 7.8 Small right pleural effusion on scanning CT cervical spine head negative for acute fracture although extensive spondylosis EKG sinus rhythm  Admitted with risk of fall and for therapy to evaluate-patient's niece [phone]  Hospital-Problem based course  Recurrent falls Patient has home services for the past 5 weeks with a sitter for 4 hours a day Despite this she had a fall when the sitter was off on holiday on 4/16 We will need to engage TOC to talk with family member listed in the chart ?  Concussion Stable at this time no overt damage at then to her neck from prior full Cognitive deficits She has at least grade 1-2 dementia is slightly forgetful and this is c compounded by her left eye blindness and presbycusis She will need intermittent supervision at least AKI on admission Creatinine is trended downwards patient is able to eat and drink Saline lock at this time At risk for aspiration Continue dysphagia 2 diet-speech therapy to follow-up not emergently Cervical spondylosis/osteoarthritis Tylenol as as needed Left eye blind Resume tobramycin ophthalmic solution BMI 20 Supervision and hand feeding-hold supplements when able   DVT  prophylaxis:  Code Status:  Family Communication: called Terri Niece and left VM Terry Disposition:  Status is: Inpatient  Remains inpatient appropriate because:Unsafe d/c plan   Dispo: The patient is from: Home              Anticipated d/c is to: unclear at this time              Patient currently is not medically stable to d/c.   Difficult to place patient No    Consultants:   None  Procedures: Multiple  Antimicrobials: None   Subjective: Very pleasant-slightly confused at times and perseverates but otherwise sitting comfortably at the bedside A friend has come to visit with her who informs me she does have some care at home over the past 5 weeks when knees was in [place]  Objective: Vitals:   11/14/20 1926 11/15/20 0431 11/15/20 0825 11/15/20 1140  BP: 108/64 (!) 144/98 (!) 142/86 (!) 149/85  Pulse: 93 (!) 107 97 86  Resp: 17 18 16 16   Temp: 98 F (36.7 C) (!) 97.5 F (36.4 C) (!) 97.5 F (36.4 C) 97.9 F (36.6 C)  TempSrc: Oral Oral Oral Oral  SpO2: 95% 95% 95% 96%  Weight:      Height:        Intake/Output Summary (Last 24 hours) at 11/15/2020 1537 Last data filed at 11/14/2020 2302 Gross per 24 hour  Intake 400 ml  Output 475 ml  Net -75 ml   Filed Weights   11/13/20 1522  Weight: 45.4 kg    Examination:  Awake alert coherent no distress EOMI NCAT no focal deficit Very frail cachectic appearing BMI 20 CTA B no added sound No lower extremity edema large bandage  over posterior lateral calf Multiple skin lesions including thin skin Neurologically moving all 4 limbs without deficit  Data Reviewed: personally reviewed   CBC    Component Value Date/Time   WBC 8.9 11/15/2020 0556   RBC 4.76 11/15/2020 0556   HGB 14.5 11/15/2020 0556   HGB 13.6 04/28/2014 1149   HCT 43.8 11/15/2020 0556   HCT 41.2 04/28/2014 1149   PLT 298 11/15/2020 0556   PLT 235 04/28/2014 1149   MCV 92.0 11/15/2020 0556   MCV 93 04/28/2014 1149   MCH 30.5 11/15/2020  0556   MCHC 33.1 11/15/2020 0556   RDW 13.9 11/15/2020 0556   RDW 13.6 04/28/2014 1149   LYMPHSABS 1.4 11/13/2020 1555   LYMPHSABS 1.6 12/07/2012 0153   MONOABS 0.6 11/13/2020 1555   MONOABS 0.6 12/07/2012 0153   EOSABS 0.1 11/13/2020 1555   EOSABS 0.2 12/07/2012 0153   BASOSABS 0.1 11/13/2020 1555   BASOSABS 0.1 12/07/2012 0153   CMP Latest Ref Rng & Units 11/15/2020 11/13/2020 03/16/2020  Glucose 70 - 99 mg/dL 97 82 84  BUN 8 - 23 mg/dL 17 35(H) 33(H)  Creatinine 0.44 - 1.00 mg/dL 0.84 0.95 1.04  Sodium 135 - 145 mmol/L 137 137 139  Potassium 3.5 - 5.1 mmol/L 3.9 4.6 4.6  Chloride 98 - 111 mmol/L 104 104 102  CO2 22 - 32 mmol/L 22 24 28   Calcium 8.9 - 10.3 mg/dL 8.7(L) 9.3 9.7  Total Protein 6.5 - 8.1 g/dL - 7.0 6.9  Total Bilirubin 0.3 - 1.2 mg/dL - 0.7 0.5  Alkaline Phos 38 - 126 U/L - 48 52  AST 15 - 41 U/L - 27 20  ALT 0 - 44 U/L - 18 13     Radiology Studies: CT ABDOMEN PELVIS WO CONTRAST  Result Date: 11/14/2020 CLINICAL DATA:  Fall yesterday. Possible pelvic fracture on plain film. EXAM: CT ABDOMEN AND PELVIS WITHOUT CONTRAST TECHNIQUE: Multidetector CT imaging of the abdomen and pelvis was performed following the standard protocol without IV contrast. COMPARISON:  CT abdomen dated 11/01/2017. FINDINGS: Lower chest: Small RIGHT pleural effusion.  Bibasilar atelectasis. Hepatobiliary: No focal liver abnormality is seen. Gallbladder is mildly distended but otherwise unremarkable, characterization limited by patient motion artifact. No evidence of bile duct dilatation. Pancreas: Parenchymal atrophy.  No peripancreatic fluid is seen. Spleen: Normal in size without focal abnormality. Adrenals/Urinary Tract: Adrenal glands are unremarkable. Kidneys are unremarkable without stone or hydronephrosis.z bladder appears normal. Stomach/Bowel: No dilated large or small bowel loops. No evidence of bowel wall inflammation. Appendix appears normal where seen. Scattered colonic diverticulosis  but no focal inflammatory change to suggest acute diverticulitis. Stomach is unremarkable, partially decompressed. Vascular/Lymphatic: Aortic atherosclerosis. No enlarged lymph nodes are seen. Reproductive: Uterus and bilateral adnexa are unremarkable. Other: No free fluid or abscess collection is seen. No free intraperitoneal air. Musculoskeletal: No osseous fracture is identified. Specifically, no evidence of acute fracture or osseous displacement within the pelvis. Pubic rami appear intact and normally aligned. Degenerative spondylosis of the scoliotic thoracolumbar spine, moderate in degree. IMPRESSION: 1. No acute findings within the abdomen or pelvis. No evidence of solid organ injury. No free fluid or abscess collection. No free intraperitoneal air. 2. No osseous fracture or dislocation. Specifically, no fracture is identified within the osseous pelvis. 3. Small RIGHT pleural effusion. Bibasilar atelectasis. 4. Colonic diverticulosis without evidence of acute diverticulitis. Aortic Atherosclerosis (ICD10-I70.0). Electronically Signed   By: Franki Cabot M.D.   On: 11/14/2020 10:02   DG Chest 1 View  Result Date: 11/13/2020 CLINICAL DATA:  [place] out of a chair, sternal pain radiating to RIGHT ribs and back EXAM: CHEST  1 VIEW COMPARISON:  Portable exam 1629 hours compared to 04/11/2019 FINDINGS: Rotated to the LEFT. Normal heart size, mediastinal contours, and pulmonary vascularity. Atherosclerotic calcification aorta. Lungs clear. No pleural effusion or pneumothorax. Bones demineralized with LEFT shoulder prosthesis noted. IMPRESSION: No acute abnormalities. Electronically Signed   By: Lavonia Dana M.D.   On: 11/13/2020 16:48   CT Head Wo Contrast  Result Date: 11/13/2020 CLINICAL DATA:  [place] out of a chair, confusion EXAM: CT HEAD WITHOUT CONTRAST TECHNIQUE: Contiguous axial images were obtained from the base of the skull through the vertex without intravenous contrast. COMPARISON:  04/11/2019  FINDINGS: Brain: Stable chronic ischemic changes within the bilateral basal ganglia, bilateral cerebellar hemispheres, and diffusely throughout the bilateral periventricular white matter. No signs of acute infarct or hemorrhage. The lateral ventricles and midline structures are unremarkable. No acute extra-axial fluid collections. No mass effect. Vascular: No hyperdense vessel or unexpected calcification. Skull: Normal. Negative for fracture or focal lesion. Sinuses/Orbits: Stable postsurgical changes left orbit. Paranasal sinuses are clear. Other: None. IMPRESSION: 1. No acute intracranial process. 2. Stable chronic ischemic changes throughout the white matter, basal ganglia, and cerebellum. Electronically Signed   By: Randa Ngo M.D.   On: 11/13/2020 16:36   CT Cervical Spine Wo Contrast  Result Date: 11/13/2020 CLINICAL DATA:  [place] out of a chair, back pain EXAM: CT CERVICAL SPINE WITHOUT CONTRAST TECHNIQUE: Multidetector CT imaging of the cervical spine was performed without intravenous contrast. Multiplanar CT image reconstructions were also generated. COMPARISON:  02/11/2007 FINDINGS: Alignment: Mild anterolisthesis of C6 on C7. Otherwise alignment is anatomic. Skull base and vertebrae: No acute fracture. No primary bone lesion or focal pathologic process. Soft tissues and spinal canal: No prevertebral fluid or swelling. No visible canal hematoma. Disc levels: Prominent spondylosis at C5-6 and C6-7. There is diffuse facet hypertrophy. Marked hypertrophic changes at the C1-C2 interface. Upper chest: Airways patent.  Lung apices are clear. Other: Reconstructed images demonstrate no additional findings. IMPRESSION: 1. No acute cervical spine fracture. 2. Extensive multilevel spondylosis and facet hypertrophy. Electronically Signed   By: Randa Ngo M.D.   On: 11/13/2020 16:39   DG Pelvis Portable  Result Date: 11/14/2020 CLINICAL DATA:  Status post fall, cannot walk. EXAM: PORTABLE PELVIS 1-2 VIEWS  COMPARISON:  Lumbar x-ray August 30, 2017 FINDINGS: There is a subtle lucency with possible cortical discontinuity of the right inferior pubic rami, subtle fracture is not excluded. Chest degenerative joint changes of bilateral hips with narrowed joint space and osteophyte formation are noted. Degenerative joint changes of lumbar spine are noted. IMPRESSION: 1. There is a subtle lucency with possible cortical discontinuity of the right inferior pubic rami, subtle fracture is not excluded. 2. Degenerative joint changes of bilateral hips and lumbar spine. Electronically Signed   By: Abelardo Diesel M.D.   On: 11/14/2020 09:04     Scheduled Meds: . enoxaparin (LOVENOX) injection  30 mg Subcutaneous Q24H  . pantoprazole  40 mg Oral Daily  . polyethylene glycol  17 g Oral Daily   Continuous Infusions:   LOS: 1 day   Time spent: [place], MD [facility] To contact the attending provider between 7A-7P or the covering provider during after hours 7P-7A, please log into the web site [email or web address] and access using universal Center password for that web site. If you do not have the password,  please call the hospital operator.  11/15/2020, 3:37 PM

## 2020-11-15 NOTE — Hospital Course (Addendum)
87 year old black female community dwelling History of Barrett's esophagus with esophagitis depression reflux Presented initially for 11/18/2020 with fall laceration to leg Represented to [facility] triage with fall out of chair-Per EMS complained of sternal pain-->right rib back pain and was confused on arrival-received Haldol Ativan-too weak to stand ambulation was difficult x-rays performed show possible ramus fracture although CT scan did not confirm the same It was felt she had possible concussion versus cognitive impairment by emergency physician BUNs/creatinine 35/0.9, troponin 39-->36 White count 7.8 Small right pleural effusion on scanning CT cervical spine head negative for acute fracture although extensive spondylosis EKG sinus rhythm  Admitted with risk of fall and for therapy to evaluate-patient's niece [phone]  Data BUNs/creatinine 35/0.9-->70/0.8 White count 8.9 Hemoglobin 14, platelet 298  Await PT eval, needs therapy eval, placed on dysphagia 2 diet  The patient demonstrated bed mobility with supervision. Good sitting balance noted. Sit <> stand with RW and minA-CGA, first attempt pt with some mild unsteadiness. She was able to ambulate ~145ft with RW and CGA. 1-2 instances of unsteadiness with light min to correct, pt also needed cueing for safety and RW guidance due to vision impairments. Overall the patient demonstrated near return to baseline functioning, but would benefit from further skilled PT intervention to maximize mobility, independence and safety to decrease risk of future falls. Recommendation is HHPT as well as near 24/7 supervision/assistance at discharge in the short term to ensure safety, visitor aware and thinks that this would be possible.

## 2020-11-15 NOTE — Evaluation (Signed)
Physical Therapy Evaluation Patient Details Name: Christy Cisneros MRN: [id number] DOB: 1919/12/08 Today's Date: 11/15/2020   History of Present Illness  Pt is a 87 y.o. female with medical history significant for   TIA, osteoarthritis, glaucoma, GERD, depression, chronic bronchitis, sleep apnea who was brought into the ER by EMS after she fell. Complaining of sternum, rib, and back pain. Imaging negative, including CT.    Clinical Impression  Patient alert, agreeable to PT, oriented to self only. Visitor in room at end of session to assist with PLOF information. Pt lives alone but has aides/family/friends to assist Monday-Friday 10-2PM, as well as assistance on the weekends. Visitor unaware of further falls; pt ambulates with walking stick and is modI/I for ADLs at home, needs assistance for IADLs and walks almost daily in her neighborhood.  The patient demonstrated bed mobility with supervision. Good sitting balance noted. Sit <> stand with RW and minA-CGA, first attempt pt with some mild unsteadiness. She was able to ambulate ~156ft with RW and CGA. 1-2 instances of unsteadiness with light min to correct, pt also needed cueing for safety and RW guidance due to vision impairments. Overall the patient demonstrated near return to baseline functioning, but would benefit from further skilled PT intervention to maximize mobility, independence and safety to decrease risk of future falls. Recommendation is HHPT as well as near 24/7 supervision/assistance at discharge in the short term to ensure safety, visitor aware and thinks that this would be possible.    Follow Up Recommendations Home health PT;Supervision for mobility/OOB;Supervision/Assistance - 24 hour    Equipment Recommendations  Rolling walker with 5" wheels    Recommendations for Other Services OT consult     Precautions / Restrictions Precautions Precautions: Fall Restrictions Weight Bearing Restrictions: No      Mobility  Bed  Mobility Overal bed mobility: Needs Assistance Bed Mobility: Supine to Sit     Supine to sit: Supervision;HOB elevated     General bed mobility comments: extended time, re-direction to task    Transfers Overall transfer level: Needs assistance Equipment used: Rolling walker (2 wheeled);None Transfers: Sit to/from Stand Sit to Stand: Min guard;Min assist         General transfer comment: several transfers performed, 1 instance of minA for steadying  Ambulation/Gait   Gait Distance (Feet): 170 Feet Assistive device: Rolling walker (2 wheeled)       General Gait Details: pt able to use RW with cueing, normally uses walking stick so attempted to leave RW a couple times to the side while ambulating. 1-2 instances of minA to address slight unsteadiness, constant cues for safety due to decreased vision  Stairs            Wheelchair Mobility    Modified Rankin (Stroke Patients Only)       Balance Overall balance assessment: Needs assistance Sitting-balance support: Feet supported Sitting balance-Leahy Scale: Good       Standing balance-Leahy Scale: Fair                               Pertinent Vitals/Pain Pain Assessment: Faces Faces Pain Scale: No hurt    Home Living Family/patient expects to be discharged to:: Private residence Living Arrangements: Alone Available Help at Discharge: Family;Friend(s);Available PRN/intermittently;Personal care attendant (mon-friday 10-2pm sunday 11-530/6PM, has assistance on saturdays as well) Type of Home: Apartment Home Access: Stairs to enter Entrance Stairs-Rails: Left Entrance Stairs-Number of Steps: 3 Home Layout: One  level Home Equipment:  (walking stick)      Prior Function Level of Independence: Needs assistance   Gait / Transfers Assistance Needed: ambulates with walking sticks, goes on daily walks  ADL's / Homemaking Assistance Needed: per visitor pt performs her own dressing and bathing. aides  assist with IADLs as needed, she does not drive        Hand Dominance        Extremity/Trunk Assessment   Upper Extremity Assessment Upper Extremity Assessment: Defer to OT evaluation;Overall [facility] for tasks assessed    Lower Extremity Assessment Lower Extremity Assessment: Generalized weakness;Overall [facility] for tasks assessed    Cervical / Trunk Assessment Cervical / Trunk Assessment: Kyphotic  Communication   Communication: HOH (hearing aides)  Cognition Arousal/Alertness: Awake/alert Behavior During Therapy: WFL for tasks assessed/performed Overall Cognitive Status: History of cognitive impairments - at baseline                                 General Comments: pt oriented to self only      General Comments      Exercises Other Exercises Other Exercises: visitor in room at end of session to discuss PT role, POC, PLOF   Assessment/Plan    PT Assessment Patient needs continued PT services  PT Problem List Decreased mobility;Decreased safety awareness;Decreased balance       PT Treatment Interventions DME instruction;Therapeutic exercise;Gait training;Balance training;Stair training;Neuromuscular re-education;Therapeutic activities;Patient/family education    PT Goals (Current goals can be found in the Care Plan section)  Acute Rehab PT Goals Patient Stated Goal: to go home PT Goal Formulation: With patient/family Time For Goal Achievement: 11/29/20 Potential to Achieve Goals: Good    Frequency Min 2X/week   Barriers to discharge        Co-evaluation               AM-PAC PT "6 Clicks" Mobility  Outcome Measure Help needed turning from your back to your side while in a flat bed without using bedrails?: A Little Help needed moving from lying on your back to sitting on the side of a flat bed without using bedrails?: A Little Help needed moving to and from a bed to a chair (including a wheelchair)?: A Little Help needed standing up from a  chair using your arms (e.g., wheelchair or bedside chair)?: A Little Help needed to walk in hospital room?: A Little Help needed climbing 3-5 steps with a railing? : A Little 6 Click Score: 18    End of Session Equipment Utilized During Treatment: Gait belt Activity Tolerance: Patient tolerated treatment well Patient left: in chair;with chair alarm set;with call bell/phone within reach (head start chair alarm) Nurse Communication: Mobility status PT Visit Diagnosis: Other abnormalities of gait and mobility (R26.89);Difficulty in walking, not elsewhere classified (R26.2)    Time: 8891-6945 PT Time Calculation (min) (ACUTE ONLY): 35 min   Charges:   PT Evaluation $PT Eval Low Complexity: 1 Low PT Treatments $Gait Training: 8-22 mins $Therapeutic Exercise: 8-22 mins        Lieutenant Diego PT, DPT 1:00 PM,11/15/20

## 2020-11-16 LAB — RENAL FUNCTION PANEL
Albumin: 3.7 g/dL (ref 3.5–5.0)
Anion gap: 8 (ref 5–15)
BUN: 19 mg/dL (ref 8–23)
CO2: 23 mmol/L (ref 22–32)
Calcium: 8.4 mg/dL — ABNORMAL LOW (ref 8.9–10.3)
Chloride: 105 mmol/L (ref 98–111)
Creatinine, Ser: 0.82 mg/dL (ref 0.44–1.00)
GFR, Estimated: >60 mL/min (ref >60)
Glucose, Bld: 103 mg/dL — ABNORMAL HIGH (ref 70–99)
Phosphorus: 2.6 mg/dL (ref 2.5–4.6)
Potassium: 3.9 mmol/L (ref 3.5–5.1)
Sodium: 136 mmol/L (ref 135–145)

## 2020-11-16 NOTE — Plan of Care (Signed)
  Problem: Education: Goal: Knowledge of General Education information will improve Description: Including pain rating scale, medication(s)/side effects and non-pharmacologic comfort measures Outcome: Progressing   Problem: Clinical Measurements: Goal: Will remain free from infection Outcome: Progressing   Problem: Activity: Goal: Risk for activity intolerance will decrease Outcome: Progressing   Problem: Nutrition: Goal: Adequate nutrition will be maintained Outcome: Progressing   Problem: Coping: Goal: Level of anxiety will decrease Outcome: Progressing   Problem: Elimination: Goal: Will not experience complications related to bowel motility Outcome: Progressing   Problem: Safety: Goal: Ability to remain free from injury will improve Outcome: Progressing   Problem: Skin Integrity: Goal: Risk for impaired skin integrity will decrease Outcome: Progressing

## 2020-11-16 NOTE — Progress Notes (Signed)
Met with the patient and then called her friend Sharyn Creamer reports that the patient can get confused at times The patient lives alone in an apartment and has a PCA worker daily from 10-2 and then later a friend drops by to help and to visit The patient has had [place] in the past and is caused disruption and more confusion reported by Pamala Hurry She walks with a walking stick She walks daily at home around about a 3 to 4 block radius Pamala Hurry stated that she has a group of friends that check on her and help her with IADLs when needed, every sat and Sunday friends take turns taking to the grocer store helping with laundry etc The plan is for Pamala Hurry to provide transportation once the patient is able to DIscharge

## 2020-11-16 NOTE — Evaluation (Signed)
Occupational Therapy Evaluation Patient Details Name: Christy Cisneros MRN: [id number] DOB: [birth year] Today's Date: 11/16/2020    History of Present Illness Pt is a 87 y.o. female with medical history significant for   TIA, osteoarthritis, glaucoma, GERD, depression, chronic bronchitis, sleep apnea who was brought into the ER by EMS after she fell. Complaining of sternum, rib, and back pain. Imaging negative, including CT.   Clinical Impression   Christy Cisneros presents with generalized weakness, pain, and impaired cognition that impacts her ability to safely and independently engage in functional tasks.  Session limited this date due to pt's cognitive status and agitation/restlessness.  Pt was disoriented x4 and perseverative on needing help and being in pain- when asked location/nature of pain and how OTR could help, pt unable to provide answer.  RN notified of pt status.  Information for today's evaluation was gathered by clinical observations, as well as information from collateral and physical therapy evaluation in previous session.  Prior to admission, pt lived alone and was able to complete basic ADLs with modified independence using walking stick.  Pt receives in-home assistance from caregivers several hours per day for household IADLs and transportation.  OTR unable to fully assess participation in ADLs this session due to cognitive status and inability to follow one-step commands or attend to session.  OTR attempted to provide total assist for feeding, but pt declined.  Given participation in OT session and independent living situation, SNF is most appropriate discharge location at this time.  Anticipate ability to upgrade this discharge recommendation to home health OT with 24 hour supervision should pt's cognitive status improve given improved participation in physical therapy session.  Christy Cisneros will likely continue to benefit from skilled OT services in acute setting to address  functional cognition, functional strengthening, and safety and independence in ADLs.    Follow Up Recommendations  SNF;Supervision/Assistance - 24 hour    Equipment Recommendations  Other (comment) (to be determined)    Recommendations for Other Services       Precautions / Restrictions Precautions Precautions: Fall Restrictions Weight Bearing Restrictions: No      Mobility Bed Mobility Overal bed mobility: Needs Assistance Bed Mobility: Supine to Sit     Supine to sit: Supervision;HOB elevated     General bed mobility comments: extended time, re-direction to task Patient Response: Restless  Transfers Overall transfer level: Needs assistance Equipment used: Rolling walker (2 wheeled);None Transfers: Sit to/from Stand Sit to Stand: Min guard         General transfer comment: Unable to assess in today's session 2/2 cognitive status, but able to complete bed mobility and basic transfers with min physical assist per PT report in previous session    Balance Overall balance assessment: Needs assistance Sitting-balance support: Feet supported Sitting balance-Leahy Scale: Good       Standing balance-Leahy Scale: Fair Standing balance comment: progressed to poor with fatigue/weakness decreased BP                           ADL either performed or assessed with clinical judgement   ADL Overall ADL's : Needs assistance/impaired                                       General ADL Comments: Assessment limited due to restlessness, disorientation, and pain.  OTR attempted to provide total assist for feeding,  but pt declined.  Suspect pt requires less support when less agitated and disoriented- anticipate generally min assist with basic ADLs per collateral and PT report.     Vision Baseline Vision/History: Wears glasses;Glaucoma Wears Glasses: At all times Patient Visual Report: Other (comment) (unable to assess for visual changes, has low  vision per chart) Vision Assessment?: Vision impaired- to be further tested in functional context     Perception     Praxis      Pertinent Vitals/Pain Pain Assessment: Faces Pain Score: 8  Faces Pain Scale: Hurts whole lot Pain Location: Pt repeatedly speaking of pain, but unable to verbalize location or nature of pain.  RN informed Pain Descriptors / Indicators: Aching;Grimacing;Moaning;Crying Pain Intervention(s): Limited activity within patient's tolerance;Monitored during session;Patient requesting pain meds-RN notified     Hand Dominance Right   Extremity/Trunk Assessment Upper Extremity Assessment Upper Extremity Assessment: Difficult to assess due to impaired cognition;Generalized weakness   Lower Extremity Assessment Lower Extremity Assessment: Difficult to assess due to impaired cognition;Generalized weakness   Cervical / Trunk Assessment Cervical / Trunk Assessment: Kyphotic   Communication Communication Communication: HOH (hearing aides)   Cognition Arousal/Alertness: Lethargic Behavior During Therapy: Agitated Overall Cognitive Status: History of cognitive impairments - at baseline                                 General Comments: Unable to assess orientation due to perseveration on pain, but suspect disorientation to situation, date   General Comments       Exercises Other Exercises: provided education re: OT role and plan of care, fall and safety precautions, assistance for reorientation and relaxation techniques Other Exercises: attempted to provide total assist for feeding, but pt declined   Shoulder Instructions      Home Living Family/patient expects to be discharged to:: Private residence Living Arrangements: Alone Available Help at Discharge: Family;Friend(s);Available PRN/intermittently;Personal care attendant (has caregiver assist for several hours per day) Type of Home: Apartment Home Access: Stairs to enter Entrance  Stairs-Number of Steps: 3 Entrance Stairs-Rails: Left Home Layout: One level     Bathroom Shower/Tub: Teacher, early years/pre: Standard     Home Equipment: Other (comment) (walking stick)          Prior Functioning/Environment Level of Independence: Needs assistance  Gait / Transfers Assistance Needed: ambulates with walking sticks, goes on daily walks ADL's / Homemaking Assistance Needed: Pt able to perform her own dressing and bathing at home (per visitor report), but receives assistance from caregivers for household IADLs and driving            OT Problem List: Decreased strength;Decreased activity tolerance;Impaired balance (sitting and/or standing);Impaired vision/perception;Decreased cognition;Decreased safety awareness;Decreased knowledge of use of DME or AE;Pain      OT Treatment/Interventions: Self-care/ADL training;Therapeutic exercise;DME and/or AE instruction;Therapeutic activities;Cognitive remediation/compensation;Patient/family education;Visual/perceptual remediation/compensation;Balance training    OT Goals(Current goals can be found in the care plan section) Acute Rehab OT Goals Patient Stated Goal: "I need help" OT Goal Formulation: Patient unable to participate in goal setting Time For Goal Achievement: 11/30/20 Potential to Achieve Goals: Fair  OT Frequency: Min 2X/week   Barriers to D/C: Decreased caregiver support          Co-evaluation              AM-PAC OT "6 Clicks" Daily Activity     Outcome Measure Help from another person eating meals?: A Lot  Help from another person taking care of personal grooming?: A Lot Help from another person toileting, which includes using toliet, bedpan, or urinal?: A Lot Help from another person bathing (including washing, rinsing, drying)?: A Lot Help from another person to put on and taking off regular upper body clothing?: A Lot Help from another person to put on and taking off regular lower  body clothing?: A Lot 6 Click Score: 12   End of Session Nurse Communication: Patient requests pain meds  Activity Tolerance: Treatment limited secondary to agitation Patient left: in chair;with call bell/phone within reach;with chair alarm set  OT Visit Diagnosis: Muscle weakness (generalized) (M62.81);Other symptoms and signs involving cognitive function                Time: 1607-3710 OT Time Calculation (min): 10 min Charges:  OT General Charges $OT Visit: 1 Visit OT Evaluation $OT Eval Moderate Complexity: 1 Mod  Brenisha Tsui Wells Ilene Witcher, OTR/L 11/16/20, 10:23 AM

## 2020-11-16 NOTE — Progress Notes (Signed)
Physical Therapy Treatment Patient Details Name: Christy Cisneros MRN: [id number] DOB: 05-06-1920 Today's Date: 11/16/2020    History of Present Illness Pt is a 87 y.o. female with medical history significant for   TIA, osteoarthritis, glaucoma, GERD, depression, chronic bronchitis, sleep apnea who was brought into the ER by EMS after she fell. Complaining of sternum, rib, and back pain. Imaging negative, including CT.    PT Comments    Patient easily woken, oriented to name, place. Agreeable to mobility; limited this session but increased fatigue/low BP. Pt was able to ambulate to commode with RW and minA for environmental guidance due to decreased vision, supervision once sitting on commode and for pericare. Cued for hand placement of grab bar, CGA once up in standing. Pt reported sudden weakness, fatigue, and that she was unable to go for a walk; modA to assist to return to chair in room. SpO2 and HR WFLs, BP 77/54. Repositioned in chair into supine, BP up to 92/62, and at end of session 90/53 sitting upright in recliner with speech at bedside. RN notified of pt status and vital signs. The patient would benefit from further skilled PT intervention to continue to progress towards goals. Recommendation remains appropriate.     Follow Up Recommendations  Home health PT;Supervision for mobility/OOB;Supervision/Assistance - 24 hour     Equipment Recommendations  Rolling walker with 5" wheels    Recommendations for Other Services OT consult     Precautions / Restrictions Precautions Precautions: Fall Restrictions Weight Bearing Restrictions: No    Mobility  Bed Mobility Overal bed mobility: Needs Assistance Bed Mobility: Supine to Sit     Supine to sit: Supervision;HOB elevated     General bed mobility comments: extended time, re-direction to task    Transfers Overall transfer level: Needs assistance Equipment used: Rolling walker (2 wheeled);None Transfers: Sit to/from  Stand Sit to Stand: Min guard         General transfer comment: more effortful for pt this AM compared to previous, able to stand from standard commode with grab bars and safety cueing  Ambulation/Gait Ambulation/Gait assistance: Min guard;Mod assist Gait Distance (Feet): 30 Feet (seated rest break at 58ft on commode)         General Gait Details: pt reported feeling very tired, weak, appeared too weak to ambulate back without assist;modA for physical assist and RW assist   Stairs             Wheelchair Mobility    Modified Rankin (Stroke Patients Only)       Balance Overall balance assessment: Needs assistance Sitting-balance support: Feet supported Sitting balance-Leahy Scale: Good       Standing balance-Leahy Scale: Fair Standing balance comment: progressed to poor with fatigue/weakness decreased BP                            Cognition Arousal/Alertness: Awake/alert Behavior During Therapy: WFL for tasks assessed/performed Overall Cognitive Status: History of cognitive impairments - at baseline                                 General Comments: oriented to self, place      Exercises Other Exercises Other Exercises: BP assessed once pt returned to chair, 77/54 HR 93. Reclined to nearly supine in chair, BP up to 92/62, HR 87. After time, returned to upright positioning in recliner for speech evaluation,  BP 90/53 HR 89.    General Comments        Pertinent Vitals/Pain Pain Assessment: Faces Faces Pain Scale: No hurt    Home Living                      Prior Function            PT Goals (current goals can now be found in the care plan section) Progress towards PT goals: Progressing toward goals    Frequency    Min 2X/week      PT Plan Current plan remains appropriate    Co-evaluation              AM-PAC PT "6 Clicks" Mobility   Outcome Measure  Help needed turning from your back to your side  while in a flat bed without using bedrails?: A Little Help needed moving from lying on your back to sitting on the side of a flat bed without using bedrails?: A Little Help needed moving to and from a bed to a chair (including a wheelchair)?: A Little Help needed standing up from a chair using your arms (e.g., wheelchair or bedside chair)?: A Little Help needed to walk in hospital room?: A Little Help needed climbing 3-5 steps with a railing? : A Little 6 Click Score: 18    End of Session Equipment Utilized During Treatment: Gait belt Activity Tolerance: Treatment limited secondary to medical complications (Comment);Other (comment);Patient limited by fatigue (limited by low BP) Patient left: in chair;with chair alarm set;with call bell/phone within reach (head start chair alarm) Nurse Communication: Mobility status PT Visit Diagnosis: Other abnormalities of gait and mobility (R26.89);Difficulty in walking, not elsewhere classified (R26.2)     Time: 0277-4128 PT Time Calculation (min) (ACUTE ONLY): 27 min  Charges:  $Therapeutic Exercise: 23-37 mins                     Lieutenant Diego PT, DPT 9:41 AM,11/16/20

## 2020-11-16 NOTE — Progress Notes (Signed)
PROGRESS NOTE   Christy Cisneros  GLO:[id number] DOB: [birth year] DOA: 11/13/2020 PCP: Einar Pheasant, MD  Brief Narrative:   87 year old black female community dwelling History of Barrett's esophagus with esophagitis depression reflux Presented initially for 11/02/2020 with fall laceration to leg Represented to [facility] triage 4/16 with fall out of chair-Per EMS complained of sternal pain-->right rib back pain and was confused on arrival-received Haldol Ativan-too weak to stand ambulation was difficult x-rays performed show possible ramus fracture although CT scan did not confirm the same It was felt she had possible concussion versus cognitive impairment by emergency physician BUNs/creatinine 35/0.9, troponin 39-->36 White count 7.8 Small right pleural effusion on scanning CT cervical spine head negative for acute fracture although extensive spondylosis EKG sinus rhythm  Admitted with risk of fall and for therapy to evaluate-patient's niece [phone]  Hospital-Problem based course  Recurrent falls Patient has home services for the past 5 weeks with a sitter for 4 hours a day Despite this she had a fall when the sitter was off on holiday on 4/16 When I reevaluated the patient this morning 4/17-she was somewhat more weak complaining of overall pain and was unable despite to assist to get up from the chair to the bed She is not a safe discharge to home environment and will need 24/7 care I have made 2 calls to her niece at [phone] to ensure that she is aware We will initiate skilled nursing placement discussions with family At risk for hypotension Hydralazine given this morning for elevated blood pressure-became slightly hypotensive and I have discontinued all antihypertensive agents Reassess periodically ?  Concussion Stable at this time no overt damage at then to her neck from prior full Cognitive deficits She has at least grade 1-2 dementia is slightly forgetful and this is c  compounded by her left eye blindness and presbycusis She is not safe to discharge home at this time unless significant improvement occurs AKI on admission Creatinine is trended downwards patient is able to eat and drink Saline lock at this time At risk for aspiration Patient downgraded on 4/17 to dysphagia 1 diet pured consistency secondary to inability to swallow Discussed with speech therapy Cervical spondylosis/osteoarthritis Tylenol as as needed Left eye blind Resume tobramycin ophthalmic solution BMI 20 Supervision and hand feeding-hold supplements when able   DVT prophylaxis:  Code Status:  Family Communication: called Terri Niece at above number but did not reach her I have left a voicemail for her to let her know to call the unit and get in touch with myself and/or TOC to coordinate neck steps and plan Disposition:  Status is: Inpatient  Remains inpatient appropriate because:Unsafe d/c plan   Dispo: The patient is from: Home              Anticipated d/c is to: SNF              Patient currently is not medically stable to d/c.   Difficult to place patient No    Consultants:   None  Procedures: Multiple  Antimicrobials: None   Subjective:  "I am hurting all over" When I tried to elicit from where-patient is unable to tell me specifics RN reports had a headache and belly pain I visualized her trying to transfer from chair to bed and she required almost max assist OT notes reviewed and seems concordant that she will need skilled placement  Objective: Vitals:   11/16/20 0346 11/16/20 0828 11/16/20 0928 11/16/20 1020  BP: (!) 146/94 Marland Kitchen)  168/102 92/62 106/70  Pulse: 97 92 (!) 102 (!) 105  Resp: 18 14    Temp: (!) 97.5 F (36.4 C) 98.3 F (36.8 C)    TempSrc: Oral     SpO2: 95% 97%    Weight:      Height:       No intake or output data in the 24 hours ending 11/16/20 1128 Filed Weights   11/13/20 1522  Weight: 45.4 kg    Examination:  Slight  confusion at times-she is tangential and perseverates on her pain but cannot localize where Very frail cachectic appearing BMI 20 CTA B no added sound No lower extremity edema large bandage over posterior lateral calf Multiple skin lesions including thin skin Neurologically moving all 4 limbs without deficit  Data Reviewed: personally reviewed   CBC    Component Value Date/Time   WBC 8.9 11/15/2020 0556   RBC 4.76 11/15/2020 0556   HGB 14.5 11/15/2020 0556   HGB 13.6 04/28/2014 1149   HCT 43.8 11/15/2020 0556   HCT 41.2 04/28/2014 1149   PLT 298 11/15/2020 0556   PLT 235 04/28/2014 1149   MCV 92.0 11/15/2020 0556   MCV 93 04/28/2014 1149   MCH 30.5 11/15/2020 0556   MCHC 33.1 11/15/2020 0556   RDW 13.9 11/15/2020 0556   RDW 13.6 04/28/2014 1149   LYMPHSABS 1.4 11/13/2020 1555   LYMPHSABS 1.6 12/07/2012 0153   MONOABS 0.6 11/13/2020 1555   MONOABS 0.6 12/07/2012 0153   EOSABS 0.1 11/13/2020 1555   EOSABS 0.2 12/07/2012 0153   BASOSABS 0.1 11/13/2020 1555   BASOSABS 0.1 12/07/2012 0153   CMP Latest Ref Rng & Units 11/16/2020 11/15/2020 11/13/2020  Glucose 70 - 99 mg/dL 103(H) 97 82  BUN 8 - 23 mg/dL 19 17 35(H)  Creatinine 0.44 - 1.00 mg/dL 0.82 0.84 0.95  Sodium 135 - 145 mmol/L 136 137 137  Potassium 3.5 - 5.1 mmol/L 3.9 3.9 4.6  Chloride 98 - 111 mmol/L 105 104 104  CO2 22 - 32 mmol/L 23 22 24   Calcium 8.9 - 10.3 mg/dL 8.4(L) 8.7(L) 9.3  Total Protein 6.5 - 8.1 g/dL - - 7.0  Total Bilirubin 0.3 - 1.2 mg/dL - - 0.7  Alkaline Phos 38 - 126 U/L - - 48  AST 15 - 41 U/L - - 27  ALT 0 - 44 U/L - - 18     Radiology Studies: No results found.   Scheduled Meds: . enoxaparin (LOVENOX) injection  30 mg Subcutaneous Q24H  . pantoprazole  40 mg Oral Daily  . polyethylene glycol  17 g Oral Daily  . tobramycin  1 drop Left Eye Q4H   Continuous Infusions:   LOS: 2 days   Time spent: [place], MD [facility] To contact the attending provider  between 7A-7P or the covering provider during after hours 7P-7A, please log into the web site [email or web address] and access using universal [place] password for that web site. If you do not have the password, please call the hospital operator.  11/16/2020, 11:28 AM

## 2020-11-16 NOTE — Evaluation (Signed)
Clinical/Bedside Swallow Evaluation Patient Details  Name: Christy Cisneros MRN: [id number] Date of Birth: 1920-05-21  Today's Date: 11/16/2020 Time: SLP Start Time (ACUTE ONLY): 0910 SLP Stop Time (ACUTE ONLY): 0930 SLP Time Calculation (min) (ACUTE ONLY): 20 min  Past Medical History:  Past Medical History:  Diagnosis Date  . Barrett's esophagus with esophagitis   . Chronic bronchitis ([place])   . Depression   . GERD (gastroesophageal reflux disease)   . Glaucoma   . Hypercholesteremia   . Osteoarthritis   . Osteoporosis   . Sleep apnea    has CPAP  . TIA (transient ischemic attack)    Past Surgical History:  Past Surgical History:  Procedure Laterality Date  . BUNIONECTOMY     left  . ROTATOR CUFF REPAIR     right   HPI:  87yo female admitted 11/13/20 after a fall at home. PMH: dementia, TIA, GERD, Barrett's esophagus, OA, depression. CXR negative.   Assessment / Plan / Recommendation Clinical Impression  Pt seen at bedside to assess swallow function and safety. Pt was seated upright in recliner, having just finished a PT session. Pt was evaluated during breakfast. CN exam appears unremarkable, however, pt is confused and therefore unable to complete all tasks. Pt accepted trials of thin liquid, puree, and solid textures, and required total assist for feeding. No overt s/s aspiration observed on any consistency, however, pt increased work of breathing following solid trials. Several minutes after PO trials, pt exhibited slight throat clearing. Anticipate this is due to primary esophageal dysphagia, given history of GERD and Barrett's esophagus. Recommend puree (Dys1) diet and thin liquids at this time. May advance solids as pt strength and endurance improve. SLP will follow for diet tolerance assessment, and to continue education. Safe swallow precautions posted at [facility] and discussed with RN and MD.   SLP Visit Diagnosis: Dysphagia, unspecified (R13.10)    Aspiration Risk   Moderate aspiration risk;Risk for inadequate nutrition/hydration    Diet Recommendation Dysphagia 1 (Puree);Thin liquid   Liquid Administration via: Straw;Cup Medication Administration: Whole meds with puree (crush meds if needed) Supervision: Full supervision/cueing for compensatory strategies Compensations: Minimize environmental distractions;Slow rate;Small sips/bites Postural Changes: Seated upright at 90 degrees;Remain upright for at least 30 minutes after po intake    Other  Recommendations Oral Care Recommendations: Oral care BID   Follow up Recommendations 24 hour supervision/assistance      Frequency and Duration min 1 x/week  1 week;2 weeks       Prognosis Prognosis for Safe Diet Advancement: Fair Barriers to Reach Goals: Cognitive deficits Barriers/Prognosis Comment: esophageal dysmotility      Swallow Study   General Date of Onset: 11/13/20 HPI: 87yo female admitted 11/13/20 after a fall at home. PMH: dementia, TIA, GERD, Barrett's esophagus, OA, depression. CXR negative. Type of Study: Bedside Swallow Evaluation Previous Swallow Assessment: ST screened 03/2017 - reg/thin, whole pills Diet Prior to this Study: Dysphagia 2 (chopped);Thin liquids Temperature Spikes Noted: No Respiratory Status: Room air History of Recent Intubation: No Behavior/Cognition: Alert;Cooperative Oral Cavity Assessment: Within Functional Limits Oral Care Completed by SLP: No Oral Cavity - Dentition: Adequate natural dentition Vision: Impaired for self-feeding Self-Feeding Abilities: Total assist Patient Positioning: Upright in chair Baseline Vocal Quality: Normal Volitional Cough: Weak Volitional Swallow: Unable to elicit    Oral/Motor/Sensory Function Overall Oral Motor/Sensory Function: Within functional limits   Ice Chips Ice chips: Not tested   Thin Liquid Thin Liquid: Within functional limits Presentation: Straw    Nectar Thick  Nectar Thick Liquid: Not tested   Honey Thick  Honey Thick Liquid: Not tested   Puree Puree: Within functional limits Presentation: Spoon   Solid     Solid: Impaired Oral Phase Impairments: Impaired mastication Pharyngeal Phase Impairments: Throat Clearing - Delayed Other Comments: increased work of breathing following solids     Sway Guttierrez B. Quentin Ore, [facility], [place] Speech Language Pathologist  Shonna Chock 11/16/2020,9:51 AM

## 2020-11-16 NOTE — Progress Notes (Signed)
Pt's BP was 168/102 (taken twice to confirm) this morning, gave prn hydralazine. An hour later when getting up with PT, BP dropped to 92/62. MD notified.

## 2020-11-17 MED ORDER — POLYETHYLENE GLYCOL 3350 17 G PO PACK: 17.0000 g | PACK | Freq: Every day | ORAL | Status: DC

## 2020-11-17 MED ORDER — SENNOSIDES-DOCUSATE SODIUM 8.6-50 MG PO TABS: 1.0000 | ORAL_TABLET | Freq: Every day | ORAL | Status: DC

## 2020-11-17 NOTE — Care Management Important Message (Signed)
Important Message  Patient Details  Name: Christy Cisneros MRN: [id number] Date of Birth: 1919-10-21   Medicare Important Message Given:  N/A - LOS <3 / Initial given by admissions     Juliann Pulse A Aly Hauser 11/17/2020, 1:58 PM

## 2020-11-17 NOTE — Plan of Care (Signed)
  Problem: Education: Goal: Knowledge of General Education information will improve Description: Including pain rating scale, medication(s)/side effects and non-pharmacologic comfort measures 11/17/2020 1338 by Jules Schick, RN Outcome: Completed/Met 11/17/2020 1338 by Jules Schick, RN Outcome: Adequate for Discharge 11/17/2020 0912 by Jules Schick, RN Outcome: Progressing   Problem: Health Behavior/Discharge Planning: Goal: Ability to manage health-related needs will improve 11/17/2020 1338 by Jules Schick, RN Outcome: Completed/Met 11/17/2020 1338 by Jules Schick, RN Outcome: Adequate for Discharge 11/17/2020 0912 by Jules Schick, RN Outcome: Progressing   Problem: Clinical Measurements: Goal: Ability to maintain clinical measurements within normal limits will improve 11/17/2020 1338 by Jules Schick, RN Outcome: Completed/Met 11/17/2020 1338 by Jules Schick, RN Outcome: Adequate for Discharge 11/17/2020 0912 by Jules Schick, RN Outcome: Progressing Goal: Will remain free from infection 11/17/2020 1338 by Jules Schick, RN Outcome: Completed/Met 11/17/2020 1338 by Jules Schick, RN Outcome: Adequate for Discharge 11/17/2020 0912 by Jules Schick, RN Outcome: Progressing Goal: Diagnostic test results will improve 11/17/2020 1338 by Jules Schick, RN Outcome: Completed/Met 11/17/2020 1338 by Jules Schick, RN Outcome: Adequate for Discharge 11/17/2020 0912 by Jules Schick, RN Outcome: Progressing Goal: Respiratory complications will improve 11/17/2020 1338 by Jules Schick, RN Outcome: Completed/Met 11/17/2020 1338 by Jules Schick, RN Outcome: Adequate for Discharge 11/17/2020 0912 by Jules Schick, RN Outcome: Progressing Goal: Cardiovascular complication will be avoided 11/17/2020 1338 by Jules Schick, RN Outcome: Completed/Met 11/17/2020 1338 by Jules Schick, RN Outcome: Adequate for  Discharge 11/17/2020 0912 by Jules Schick, RN Outcome: Progressing   Problem: Activity: Goal: Risk for activity intolerance will decrease 11/17/2020 1338 by Jules Schick, RN Outcome: Completed/Met 11/17/2020 1338 by Jules Schick, RN Outcome: Adequate for Discharge 11/17/2020 0912 by Jules Schick, RN Outcome: Progressing   Problem: Nutrition: Goal: Adequate nutrition will be maintained 11/17/2020 1338 by Jules Schick, RN Outcome: Completed/Met 11/17/2020 1338 by Jules Schick, RN Outcome: Adequate for Discharge 11/17/2020 0912 by Jules Schick, RN Outcome: Progressing   Problem: Coping: Goal: Level of anxiety will decrease 11/17/2020 1338 by Jules Schick, RN Outcome: Completed/Met 11/17/2020 1338 by Jules Schick, RN Outcome: Adequate for Discharge 11/17/2020 0912 by Jules Schick, RN Outcome: Progressing   Problem: Elimination: Goal: Will not experience complications related to bowel motility 11/17/2020 1338 by Jules Schick, RN Outcome: Completed/Met 11/17/2020 1338 by Jules Schick, RN Outcome: Adequate for Discharge 11/17/2020 0912 by Jules Schick, RN Outcome: Progressing Goal: Will not experience complications related to urinary retention 11/17/2020 1338 by Jules Schick, RN Outcome: Completed/Met 11/17/2020 1338 by Jules Schick, RN Outcome: Adequate for Discharge 11/17/2020 0912 by Jules Schick, RN Outcome: Progressing   Problem: Pain Managment: Goal: General experience of comfort will improve 11/17/2020 1338 by Jules Schick, RN Outcome: Completed/Met 11/17/2020 1338 by Jules Schick, RN Outcome: Adequate for Discharge 11/17/2020 0912 by Jules Schick, RN Outcome: Progressing   Problem: Safety: Goal: Ability to remain free from injury will improve 11/17/2020 1338 by Jules Schick, RN Outcome: Completed/Met 11/17/2020 1338 by Jules Schick, RN Outcome: Adequate for  Discharge 11/17/2020 0912 by Jules Schick, RN Outcome: Progressing   Problem: Skin Integrity: Goal: Risk for impaired skin integrity will decrease 11/17/2020 1338 by Jules Schick, RN Outcome: Completed/Met 11/17/2020 1338 by Jules Schick, RN Outcome: Adequate for Discharge 11/17/2020 0912 by Jules Schick, RN Outcome: Progressing

## 2020-11-17 NOTE — TOC Progression Note (Signed)
Patient will benefit from a RW at home, Notified Suanne Marker to bring one from [facility], patient agrees.

## 2020-11-17 NOTE — Plan of Care (Signed)

## 2020-11-17 NOTE — Progress Notes (Signed)
Pt discharged home with caretaker Amedeo Plenty. All instructions provided with questions answered.   BP 136/80 (BP Location: Left Arm)   Pulse (!) 107   Temp 97.9 F (36.6 C)   Resp 16   Ht 4\' 11"  (1.499 m)   Wt 45.4 kg   SpO2 100%   BMI 20.20 kg/m

## 2020-11-17 NOTE — TOC Progression Note (Addendum)
Transition of Care [facility]) - Progression Note    Patient Details  Name: CHANIQUE DUCA MRN: [id number] Date of Birth: 1920-02-07  Transition of Care [facility]) CM/SW Contact  Su Hilt, RN Phone Number: 11/17/2020, 12:34 PM  Clinical Narrative:    Damaris Schooner to Bonnita Nasuti the patient's second cousin.  She stated that The patient has no family near by and that the patient does not remember most of her family when they call her to check on her, All of her family live several states away, Pamala Hurry is the friend that has been assisting the patient for the past 20 years and continues to do so, I notified Bonnita Nasuti of the disposition and the plan for the patient to return home, Bonnita Nasuti stated that they have tried for years to get the patient to go to an assisted living facility and she refuses.  Bonnita Nasuti stated that non of the family has seen the patient for years.  She stated that she was appreciative of the call and the information and she would notify the rest of the family  Spoke with the niece Terri at [phone], Provided the same information for Karna Christmas, she will notify other family as well.  Karna Christmas stated that they came to visit the patient in March and tried to get her to go to live in a facility, she stated that the patient has been refusing for years and still does.  She is very familiar with Pamala Hurry and agrees that they will continue with the plan. She stated that the PCA may be able to increase hours so that the patient  Is not alone as long, The patient also has a call alert, Karna Christmas has the [place] and agrees that Pamala Hurry is the contact person,    Expected Discharge Plan: [place] Barriers to Discharge: Continued Medical Work up  Expected Discharge Plan and Services Expected Discharge Plan: [place]   Discharge Planning Services: CM Consult   Living arrangements for the past 2 months: Apartment                           HH Arranged: Refused HH            Social Determinants of Health (SDOH) Interventions    Readmission Risk Interventions No flowsheet data found.

## 2020-11-17 NOTE — Discharge Summary (Signed)
Physician Discharge Summary  Christy Cisneros WUX:[id number] DOB: [birth year] DOA: 11/13/2020  PCP: Einar Pheasant, MD  Admit date: 11/13/2020 Discharge date: 11/17/2020  Time spent: 36 minutes minutes  Recommendations for Outpatient Follow-up:  1. Will need outpatient reevaluation with PCP MMSE  probably end-of-life discussions and advance care planning 2. Discharging home in stable state to friends and personal care services  Discharge Diagnoses:  MAIN problem for hospitalization   Recurrent falls with confusion from underlying Alzheimer's  Please see below for itemized issues addressed in [place]- refer to other progress notes for clarity if needed  Discharge Condition:  Fair Diet recommendation: Dysphagia 1 thin liquid  Filed Weights   11/13/20 1522  Weight: 45.4 kg    History of present illness:  87 year old black female community dwelling History of Barrett's esophagus with esophagitis depression reflux Presented initially for 11/02/2020 with fall laceration to leg Represented to [facility] triage 4/16 with fall out of chair-Per EMS complained of sternal pain-->right rib back pain and was confused on arrival-received Haldol Ativan-too weak to stand ambulation was difficult x-rays performed show possible ramus fracture although CT scan did not confirm the same It was felt she had possible concussion versus cognitive impairment by emergency physician BUNs/creatinine 35/0.9, troponin 39-->36 White count 7.8 Small right pleural effusion on scanning CT cervical spine head negative for acute fracture although extensive spondylosis EKG sinus rhythm  Admitted with risk of fall and for therapy to evaluate-patient's niece [place] Hospital Course:  Recurrent falls Patient has home services for the past 5 weeks with a sitter for 4 hours a day Despite this she had a fall when the sitter was off on holiday on 4/16 Long discussion with home health agency, case management and  niece Coralyn Mark who lives in [place] at [place] agree it is better for patient to discharge to her environment given confusion that will arise in the hospital and or with therapy services Stable for discharge at this time but does need outpatient goals of care At risk for hypotension No more antihypertensives in a 87 year old ?  Concussion Stable at this time no overt damage at then to her neck from prior full Cognitive deficits She has at least grade 1-2 dementia is slightly forgetful and this is c compounded by her left eye blindness and presbycusis AKI on admission Creatinine is trended downwards patient is able to eat and drink Saline lock at this time At risk for aspiration Patient downgraded on 4/17 to dysphagia 1 diet pured consistency secondary to inability to swallow Discussed with speech therapy Cervical spondylosis/osteoarthritis Tylenol as as needed Left eye blind Resume tobramycin ophthalmic solution BMI 20 Supervision and hand feeding-hold supplements when able  Discharge Exam: Vitals:   11/17/20 0836 11/17/20 1404  BP: (!) 144/52 136/80  Pulse: 100 (!) 107  Resp: 15 16  Temp: 98.6 F (37 C) 97.9 F (36.6 C)  SpO2: 97% 100%    Subj on day of d/c   Awake pleasantly confused at times does circumnavigate conversations and is quite tangential Gently redirected she is doing fair otherwise no overt pain Her strength is resumed and I reviewed physical therapy note and she ambulated around the unit with walker  General Exam on discharge  Frail cachectic white female blind in left eye Bitemporal supraclavicular wasting Chest clear S1-S2 no murmur Abdomen soft Multiple skin bruises Horn over skin on the extensor aspects of right arm Power 5/5  Discharge Instructions   Discharge Instructions  Call MD for:  redness, tenderness, or signs of infection (pain, swelling, redness, odor or green/yellow discharge around incision site)   Complete  by: As directed    Call MD for:  severe uncontrolled pain   Complete by: As directed    Call MD for:  temperature >100.4   Complete by: As directed    Diet - low sodium heart healthy   Complete by: As directed    Discharge instructions   Complete by: As directed    Follow with Primary MD Labs in 1 week   Increase activity slowly   Complete by: As directed    No wound care   Complete by: As directed      Allergies as of 11/17/2020      Reactions   Bacitracin Other (See Comments)   Tearing, itching, swelling, redness Other Reaction: tearing, swollen red eyes Tearing, itching, swelling, redness   Cephalexin Other (See Comments)   HA, nausea, gas, abdominal pain Other reaction(s): Abdominal Pain, Other (See Comments) Other Reaction: N&V HA, nausea, gas, abdominal pain   Codeine Other (See Comments)   HA, nausea, gas, abdominal pain Other reaction(s): Abdominal Pain Other Reaction: N&V HA, nausea, gas, abdominal pain   Avelox [moxifloxacin Hcl In Nacl] Other (See Comments)   Dizziness, weakness   Cefdinir Nausea And Vomiting   Clarithromycin    HA, nausea, gas, abdominal pain   Hydrocodone-acetaminophen    REACTION: chest pain, constipation, nausea   Lansoprazole    REACTION: cramps, nausea, diarrhea   Polymyxin B    Itching and redness   Vigamox [moxifloxacin] Itching   Soreness, irritability, loss of appetite,  Dizziness, diarrhea, muscle weakness   Brimonidine Itching   Burning, stinging, bloody eye, dry mouth Burning, stinging, bloody eye, dry mouth Burning, stinging, bloody eye, dry mouth   Erythromycin Rash   HA, nausea, gas, abdominal pain HA, nausea, gas, abdominal pain      Medication List    STOP taking these medications   nystatin cream Commonly known as: MYCOSTATIN     TAKE these medications   acetaminophen 325 MG tablet Commonly known as: Tylenol Take 2 tablets (650 mg total) by mouth every 8 (eight) hours as needed for mild pain.   mupirocin  ointment 2 % Commonly known as: BACTROBAN Apply to affected area on toe bid   omeprazole 20 MG capsule Commonly known as: PRILOSEC TAKE ONE CAPSULE TWICE A DAY BEFORE MEALS   tobramycin 0.3 % ophthalmic solution Commonly known as: [place]  (From admission, onward)         Start     Ordered   11/17/20 1119  For home use only DME Walker rolling  Once       Question Answer Comment  Walker: With [place] Wheels   Patient needs a walker to treat with the following condition Weakness      11/17/20 1119         Allergies  Allergen Reactions  . Bacitracin Other (See Comments)    Tearing, itching, swelling, redness Other Reaction: tearing, swollen red eyes Tearing, itching, swelling, redness  . Cephalexin Other (See Comments)    HA, nausea, gas, abdominal pain Other reaction(s): Abdominal Pain, Other (See Comments) Other Reaction: N&V HA, nausea, gas, abdominal pain  . Codeine Other (See Comments)    HA, nausea, gas, abdominal pain Other reaction(s): Abdominal Pain Other Reaction: N&V HA, nausea, gas, abdominal  pain  . Avelox [Moxifloxacin Hcl In Nacl] Other (See Comments)    Dizziness, weakness  . Cefdinir Nausea And Vomiting  . Clarithromycin     HA, nausea, gas, abdominal pain  . Hydrocodone-Acetaminophen     REACTION: chest pain, constipation, nausea  . Lansoprazole     REACTION: cramps, nausea, diarrhea  . Polymyxin B     Itching and redness  . Vigamox [Moxifloxacin] Itching    Soreness, irritability, loss of appetite,  Dizziness, diarrhea, muscle weakness  . Brimonidine Itching    Burning, stinging, bloody eye, dry mouth Burning, stinging, bloody eye, dry mouth Burning, stinging, bloody eye, dry mouth  . Erythromycin Rash    HA, nausea, gas, abdominal pain HA, nausea, gas, abdominal pain    Follow-up Information    Einar Pheasant, MD.   Specialty: Internal Medicine Contact information: [address] [place] [address] [phone]                The results of significant diagnostics from this hospitalization (including imaging, microbiology, ancillary and laboratory) are listed below for reference.    Significant Diagnostic Studies: CT ABDOMEN PELVIS WO CONTRAST  Result Date: 11/14/2020 CLINICAL DATA:  Fall yesterday. Possible pelvic fracture on plain film. EXAM: CT ABDOMEN AND PELVIS WITHOUT CONTRAST TECHNIQUE: Multidetector CT imaging of the abdomen and pelvis was performed following the standard protocol without IV contrast. COMPARISON:  CT abdomen dated 11/01/2017. FINDINGS: Lower chest: Small RIGHT pleural effusion.  Bibasilar atelectasis. Hepatobiliary: No focal liver abnormality is seen. Gallbladder is mildly distended but otherwise unremarkable, characterization limited by patient motion artifact. No evidence of bile duct dilatation. Pancreas: Parenchymal atrophy.  No peripancreatic fluid is seen. Spleen: Normal in size without focal abnormality. Adrenals/Urinary Tract: Adrenal glands are unremarkable. Kidneys are unremarkable without stone or hydronephrosis.z bladder appears normal. Stomach/Bowel: No dilated large or small bowel loops. No evidence of bowel wall inflammation. Appendix appears normal where seen. Scattered colonic diverticulosis but no focal inflammatory change to suggest acute diverticulitis. Stomach is unremarkable, partially decompressed. Vascular/Lymphatic: Aortic atherosclerosis. No enlarged lymph nodes are seen. Reproductive: Uterus and bilateral adnexa are unremarkable. Other: No free fluid or abscess collection is seen. No free intraperitoneal air. Musculoskeletal: No osseous fracture is identified. Specifically, no evidence of acute fracture or osseous displacement within the pelvis. Pubic rami appear intact and normally aligned. Degenerative spondylosis of the scoliotic thoracolumbar spine, moderate in degree. IMPRESSION: 1. No acute  findings within the abdomen or pelvis. No evidence of solid organ injury. No free fluid or abscess collection. No free intraperitoneal air. 2. No osseous fracture or dislocation. Specifically, no fracture is identified within the osseous pelvis. 3. Small RIGHT pleural effusion. Bibasilar atelectasis. 4. Colonic diverticulosis without evidence of acute diverticulitis. Aortic Atherosclerosis (ICD10-I70.0). Electronically Signed   By: Franki Cabot M.D.   On: 11/14/2020 10:02   DG Chest 1 View  Result Date: 11/13/2020 CLINICAL DATA:  [place] out of a chair, sternal pain radiating to RIGHT ribs and back EXAM: CHEST  1 VIEW COMPARISON:  Portable exam 1629 hours compared to 04/11/2019 FINDINGS: Rotated to the LEFT. Normal heart size, mediastinal contours, and pulmonary vascularity. Atherosclerotic calcification aorta. Lungs clear. No pleural effusion or pneumothorax. Bones demineralized with LEFT shoulder prosthesis noted. IMPRESSION: No acute abnormalities. Electronically Signed   By: Lavonia Dana M.D.   On: 11/13/2020 16:48   CT Head Wo Contrast  Result Date: 11/13/2020 CLINICAL DATA:  [place] out of a chair, confusion EXAM: CT HEAD WITHOUT  CONTRAST TECHNIQUE: Contiguous axial images were obtained from the base of the skull through the vertex without intravenous contrast. COMPARISON:  04/11/2019 FINDINGS: Brain: Stable chronic ischemic changes within the bilateral basal ganglia, bilateral cerebellar hemispheres, and diffusely throughout the bilateral periventricular white matter. No signs of acute infarct or hemorrhage. The lateral ventricles and midline structures are unremarkable. No acute extra-axial fluid collections. No mass effect. Vascular: No hyperdense vessel or unexpected calcification. Skull: Normal. Negative for fracture or focal lesion. Sinuses/Orbits: Stable postsurgical changes left orbit. Paranasal sinuses are clear. Other: None. IMPRESSION: 1. No acute intracranial process. 2. Stable chronic ischemic  changes throughout the white matter, basal ganglia, and cerebellum. Electronically Signed   By: Randa Ngo M.D.   On: 11/13/2020 16:36   CT Cervical Spine Wo Contrast  Result Date: 11/13/2020 CLINICAL DATA:  [place] out of a chair, back pain EXAM: CT CERVICAL SPINE WITHOUT CONTRAST TECHNIQUE: Multidetector CT imaging of the cervical spine was performed without intravenous contrast. Multiplanar CT image reconstructions were also generated. COMPARISON:  02/11/2007 FINDINGS: Alignment: Mild anterolisthesis of C6 on C7. Otherwise alignment is anatomic. Skull base and vertebrae: No acute fracture. No primary bone lesion or focal pathologic process. Soft tissues and spinal canal: No prevertebral fluid or swelling. No visible canal hematoma. Disc levels: Prominent spondylosis at C5-6 and C6-7. There is diffuse facet hypertrophy. Marked hypertrophic changes at the C1-C2 interface. Upper chest: Airways patent.  Lung apices are clear. Other: Reconstructed images demonstrate no additional findings. IMPRESSION: 1. No acute cervical spine fracture. 2. Extensive multilevel spondylosis and facet hypertrophy. Electronically Signed   By: Randa Ngo M.D.   On: 11/13/2020 16:39   DG Pelvis Portable  Result Date: 11/14/2020 CLINICAL DATA:  Status post fall, cannot walk. EXAM: PORTABLE PELVIS 1-2 VIEWS COMPARISON:  Lumbar x-ray August 30, 2017 FINDINGS: There is a subtle lucency with possible cortical discontinuity of the right inferior pubic rami, subtle fracture is not excluded. Chest degenerative joint changes of bilateral hips with narrowed joint space and osteophyte formation are noted. Degenerative joint changes of lumbar spine are noted. IMPRESSION: 1. There is a subtle lucency with possible cortical discontinuity of the right inferior pubic rami, subtle fracture is not excluded. 2. Degenerative joint changes of bilateral hips and lumbar spine. Electronically Signed   By: Abelardo Diesel M.D.   On: 11/14/2020 09:04     Microbiology: Recent Results (from the past 240 hour(s))  SARS CORONAVIRUS 2 (TAT 6-24 HRS) Nasopharyngeal Nasopharyngeal Swab     Status: None   Collection Time: 11/14/20  8:04 AM   Specimen: Nasopharyngeal Swab  Result Value Ref Range Status   SARS Coronavirus 2 NEGATIVE NEGATIVE Final    Comment: (NOTE) SARS-CoV-2 target nucleic acids are NOT DETECTED.  The SARS-CoV-2 RNA is generally detectable in upper and lower respiratory specimens during the acute phase of infection. Negative results do not preclude SARS-CoV-2 infection, do not rule out co-infections with other pathogens, and should not be used as the sole basis for treatment or other patient management decisions. Negative results must be combined with clinical observations, patient history, and epidemiological information. The expected result is Negative.  Fact Sheet for Patients: SugarRoll.be  Fact Sheet for Healthcare Providers: https://www.woods-mathews.com/  This test is not yet approved or cleared by the [place] FDA and  has been authorized for detection and/or diagnosis of SARS-CoV-2 by FDA under an Emergency Use Authorization (EUA). This EUA will remain  in effect (meaning this test can be used) for the duration  of the COVID-19 declaration under Se ction 564(b)(1) of the Act, 21 U.S.C. section 360bbb-3(b)(1), unless the authorization is terminated or revoked sooner.  Performed at [facility], [place] [address], [place], [place] [address]      Labs: Basic Metabolic Panel: Recent Labs  Lab 11/13/20 1555 11/15/20 0556 11/16/20 0810  NA 137 137 136  K 4.6 3.9 3.9  CL 104 104 105  CO2 24 22 23   GLUCOSE 82 97 103*  BUN 35* 17 19  CREATININE 0.95 0.84 0.82  CALCIUM 9.3 8.7* 8.4*  PHOS  --   --  2.6   Liver Function Tests: Recent Labs  Lab 11/13/20 1758 11/16/20 0810  AST 27  --   ALT 18  --   ALKPHOS 48  --   BILITOT 0.7  --   PROT 7.0  --    ALBUMIN 4.1 3.7   No results for input(s): LIPASE, AMYLASE in the last 168 hours. No results for input(s): AMMONIA in the last 168 hours. CBC: Recent Labs  Lab 11/13/20 1555 11/15/20 0556  WBC 7.8 8.9  NEUTROABS 5.6  --   HGB 13.4 14.5  HCT 40.8 43.8  MCV 92.1 92.0  PLT 260 298   Cardiac Enzymes: No results for input(s): CKTOTAL, CKMB, CKMBINDEX, TROPONINI in the last 168 hours. BNP: BNP (last 3 results) No results for input(s): BNP in the last 8760 hours.  ProBNP (last 3 results) No results for input(s): PROBNP in the last 8760 hours.  CBG: No results for input(s): GLUCAP in the last 168 hours.     Signed:  Nita Sells MD   [facility] 11/17/2020, 2:07 PM

## 2020-11-17 NOTE — Progress Notes (Signed)
Physical Therapy Treatment Patient Details Name: Christy Cisneros MRN: [id number] DOB: 1919-09-05 Today's Date: 11/17/2020    History of Present Illness Pt is a 87 y.o. female with medical history significant for   TIA, osteoarthritis, glaucoma, GERD, depression, chronic bronchitis, sleep apnea who was brought into the ER by EMS after she fell. Complaining of sternum, rib, and back pain. Imaging negative, including CT.    PT Comments    Pt in recliner and able to complete +1 lap around unit.  Speed varied and needs cues to keep walker closer but overall does quite well with significant improvement from yesterday.  Pt very focused on going home today.   Follow Up Recommendations  Home health PT;Supervision for mobility/OOB;Supervision/Assistance - 24 hour     Equipment Recommendations  Rolling walker with 5" wheels    Recommendations for Other Services       Precautions / Restrictions Precautions Precautions: Fall Restrictions Weight Bearing Restrictions: No    Mobility  Bed Mobility               General bed mobility comments: in recliner before and after but anticipate no difficulty today    Transfers Overall transfer level: Needs assistance Equipment used: Rolling walker (2 wheeled);None Transfers: Sit to/from Stand Sit to Stand: Min guard            Ambulation/Gait Ambulation/Gait assistance: Min guard Gait Distance (Feet): 200 Feet Assistive device: Rolling walker (2 wheeled) Gait Pattern/deviations: Step-through pattern Gait velocity: varies during session from quickly to slow due to distractions   General Gait Details: cues to keep walker closer but no LOB or buckling   Stairs             Wheelchair Mobility    Modified Rankin (Stroke Patients Only)       Balance Overall balance assessment: Needs assistance Sitting-balance support: Feet supported Sitting balance-Leahy Scale: Good       Standing balance-Leahy Scale: Good                               Cognition Arousal/Alertness: Awake/alert Behavior During Therapy: WFL for tasks assessed/performed;Agitated Overall Cognitive Status: Within Functional Limits for tasks assessed                                 General Comments: very focused on discharge home and contacting someone to come get her      Exercises      General Comments        Pertinent Vitals/Pain Pain Assessment: No/denies pain    Home Living                      Prior Function            PT Goals (current goals can now be found in the care plan section) Progress towards PT goals: Progressing toward goals    Frequency    Min 2X/week      PT Plan Current plan remains appropriate    Co-evaluation              AM-PAC PT "6 Clicks" Mobility   Outcome Measure  Help needed turning from your back to your side while in a flat bed without using bedrails?: None Help needed moving from lying on your back to sitting on the side of a flat bed without using  bedrails?: None Help needed moving to and from a bed to a chair (including a wheelchair)?: A Little Help needed standing up from a chair using your arms (e.g., wheelchair or bedside chair)?: A Little Help needed to walk in hospital room?: A Little Help needed climbing 3-5 steps with a railing? : A Little 6 Click Score: 20    End of Session Equipment Utilized During Treatment: Gait belt Activity Tolerance: Patient tolerated treatment well Patient left: in chair;with chair alarm set;with call bell/phone within reach Nurse Communication: Mobility status PT Visit Diagnosis: Other abnormalities of gait and mobility (R26.89);Difficulty in walking, not elsewhere classified (R26.2)     Time: 1504-1364 PT Time Calculation (min) (ACUTE ONLY): 15 min  Charges:  $Gait Training: 8-22 mins                    Chesley Noon, PTA 11/17/20, 10:16 AM

## 2020-11-19 ENCOUNTER — Telehealth: Payer: Self-pay

## 2020-11-19 NOTE — Telephone Encounter (Signed)
Transition Care Management Unsuccessful Follow-up Telephone Call  Date of discharge and from where:  11/17/20 from [facility]  Attempts:  1st Attempt  Reason for unsuccessful TCM follow-up call:  Unable to reach patient

## 2020-11-20 ENCOUNTER — Encounter: Payer: Self-pay | Admitting: Emergency Medicine

## 2020-11-20 ENCOUNTER — Other Ambulatory Visit: Payer: Self-pay

## 2020-11-20 ENCOUNTER — Emergency Department: Payer: Medicare Other

## 2020-11-20 ENCOUNTER — Inpatient Hospital Stay
Admission: EM | Admit: 2020-11-20 | Discharge: 2020-11-22 | DRG: 552 | Disposition: A | Discharge status: Home Health | Payer: Medicare Other | Attending: Internal Medicine | Admitting: Internal Medicine

## 2020-11-20 DIAGNOSIS — Z66 Do not resuscitate: Secondary | ICD-10-CM | POA: Diagnosis present

## 2020-11-20 DIAGNOSIS — Z20822 Contact with and (suspected) exposure to covid-19: Secondary | ICD-10-CM | POA: Diagnosis present

## 2020-11-20 DIAGNOSIS — M810 Age-related osteoporosis without current pathological fracture: Secondary | ICD-10-CM | POA: Diagnosis present

## 2020-11-20 DIAGNOSIS — E785 Hyperlipidemia, unspecified: Secondary | ICD-10-CM | POA: Diagnosis present

## 2020-11-20 DIAGNOSIS — K2100 Gastro-esophageal reflux disease with esophagitis, without bleeding: Secondary | ICD-10-CM

## 2020-11-20 DIAGNOSIS — F0390 Unspecified dementia without behavioral disturbance: Secondary | ICD-10-CM | POA: Diagnosis present

## 2020-11-20 DIAGNOSIS — Z79899 Other long term (current) drug therapy: Secondary | ICD-10-CM

## 2020-11-20 DIAGNOSIS — J42 Unspecified chronic bronchitis: Secondary | ICD-10-CM | POA: Diagnosis not present

## 2020-11-20 DIAGNOSIS — K2270 Barrett's esophagus without dysplasia: Secondary | ICD-10-CM | POA: Diagnosis not present

## 2020-11-20 DIAGNOSIS — I2510 Atherosclerotic heart disease of native coronary artery without angina pectoris: Secondary | ICD-10-CM | POA: Diagnosis not present

## 2020-11-20 DIAGNOSIS — S22060A Wedge compression fracture of T7-T8 vertebra, initial encounter for closed fracture: Secondary | ICD-10-CM | POA: Diagnosis not present

## 2020-11-20 DIAGNOSIS — R0781 Pleurodynia: Secondary | ICD-10-CM | POA: Diagnosis not present

## 2020-11-20 DIAGNOSIS — G4730 Sleep apnea, unspecified: Secondary | ICD-10-CM | POA: Diagnosis present

## 2020-11-20 DIAGNOSIS — M549 Dorsalgia, unspecified: Secondary | ICD-10-CM | POA: Diagnosis not present

## 2020-11-20 DIAGNOSIS — Z881 Allergy status to other antibiotic agents status: Secondary | ICD-10-CM

## 2020-11-20 DIAGNOSIS — K219 Gastro-esophageal reflux disease without esophagitis: Secondary | ICD-10-CM | POA: Diagnosis present

## 2020-11-20 DIAGNOSIS — M4319 Spondylolisthesis, multiple sites in spine: Secondary | ICD-10-CM | POA: Diagnosis not present

## 2020-11-20 DIAGNOSIS — Z8249 Family history of ischemic heart disease and other diseases of the circulatory system: Secondary | ICD-10-CM

## 2020-11-20 DIAGNOSIS — S8992XA Unspecified injury of left lower leg, initial encounter: Secondary | ICD-10-CM | POA: Diagnosis not present

## 2020-11-20 DIAGNOSIS — J90 Pleural effusion, not elsewhere classified: Secondary | ICD-10-CM | POA: Diagnosis not present

## 2020-11-20 DIAGNOSIS — J9811 Atelectasis: Secondary | ICD-10-CM | POA: Diagnosis not present

## 2020-11-20 DIAGNOSIS — Z8673 Personal history of transient ischemic attack (TIA), and cerebral infarction without residual deficits: Secondary | ICD-10-CM

## 2020-11-20 DIAGNOSIS — W19XXXA Unspecified fall, initial encounter: Secondary | ICD-10-CM | POA: Diagnosis not present

## 2020-11-20 DIAGNOSIS — S2241XA Multiple fractures of ribs, right side, initial encounter for closed fracture: Secondary | ICD-10-CM | POA: Diagnosis not present

## 2020-11-20 DIAGNOSIS — W07XXXA Fall from chair, initial encounter: Secondary | ICD-10-CM | POA: Diagnosis present

## 2020-11-20 DIAGNOSIS — Z888 Allergy status to other drugs, medicaments and biological substances status: Secondary | ICD-10-CM

## 2020-11-20 DIAGNOSIS — M79661 Pain in right lower leg: Secondary | ICD-10-CM | POA: Diagnosis not present

## 2020-11-20 DIAGNOSIS — H409 Unspecified glaucoma: Secondary | ICD-10-CM | POA: Diagnosis present

## 2020-11-20 DIAGNOSIS — E7800 Pure hypercholesterolemia, unspecified: Secondary | ICD-10-CM | POA: Diagnosis present

## 2020-11-20 DIAGNOSIS — H547 Unspecified visual loss: Secondary | ICD-10-CM

## 2020-11-20 DIAGNOSIS — R296 Repeated falls: Secondary | ICD-10-CM | POA: Diagnosis present

## 2020-11-20 DIAGNOSIS — R4189 Other symptoms and signs involving cognitive functions and awareness: Secondary | ICD-10-CM | POA: Diagnosis present

## 2020-11-20 DIAGNOSIS — Z9181 History of falling: Secondary | ICD-10-CM

## 2020-11-20 DIAGNOSIS — M40204 Unspecified kyphosis, thoracic region: Secondary | ICD-10-CM | POA: Diagnosis not present

## 2020-11-20 DIAGNOSIS — H5462 Unqualified visual loss, left eye, normal vision right eye: Secondary | ICD-10-CM | POA: Diagnosis present

## 2020-11-20 DIAGNOSIS — R9431 Abnormal electrocardiogram [ECG] [EKG]: Secondary | ICD-10-CM | POA: Diagnosis not present

## 2020-11-20 DIAGNOSIS — Z885 Allergy status to narcotic agent status: Secondary | ICD-10-CM

## 2020-11-20 DIAGNOSIS — M545 Low back pain, unspecified: Secondary | ICD-10-CM | POA: Diagnosis not present

## 2020-11-20 DIAGNOSIS — F32A Depression, unspecified: Secondary | ICD-10-CM | POA: Diagnosis present

## 2020-11-20 LAB — COMPREHENSIVE METABOLIC PANEL
ALT: 28 U/L (ref 0–44)
AST: 24 U/L (ref 15–41)
Albumin: 3.6 g/dL (ref 3.5–5.0)
Alkaline Phosphatase: 55 U/L (ref 38–126)
Anion gap: 8 (ref 5–15)
BUN: 38 mg/dL — ABNORMAL HIGH (ref 8–23)
CO2: 26 mmol/L (ref 22–32)
Calcium: 9.4 mg/dL (ref 8.9–10.3)
Chloride: 105 mmol/L (ref 98–111)
Creatinine, Ser: 0.88 mg/dL (ref 0.44–1.00)
GFR, Estimated: 59 mL/min — ABNORMAL LOW (ref >60)
Glucose, Bld: 86 mg/dL (ref 70–99)
Potassium: 3.7 mmol/L (ref 3.5–5.1)
Sodium: 139 mmol/L (ref 135–145)
Total Bilirubin: 0.6 mg/dL (ref 0.3–1.2)
Total Protein: 6.3 g/dL — ABNORMAL LOW (ref 6.5–8.1)

## 2020-11-20 LAB — CBC
HCT: 38.2 % (ref 36.0–46.0)
Hemoglobin: 12.5 g/dL (ref 12.0–15.0)
MCH: 30.7 pg (ref 26.0–34.0)
MCHC: 32.7 g/dL (ref 30.0–36.0)
MCV: 93.9 fL (ref 80.0–100.0)
Platelets: 269 10*3/uL (ref 150–400)
RBC: 4.07 MIL/uL (ref 3.87–5.11)
RDW: 13.9 % (ref 11.5–15.5)
WBC: 8.6 10*3/uL (ref 4.0–10.5)
nRBC: 0 % (ref 0.0–0.2)

## 2020-11-20 MED ORDER — ONDANSETRON HCL 4 MG/2ML IJ SOLN
4.0000 mg | Freq: Once | INTRAMUSCULAR | Status: AC
  Administered 2020-11-20: 4 mg via INTRAVENOUS
  Filled 2020-11-20: qty 2

## 2020-11-20 MED ORDER — ACETAMINOPHEN 325 MG PO TABS: 650.0000 mg | ORAL_TABLET | Freq: Four times a day (QID) | ORAL | Status: DC | PRN

## 2020-11-20 MED ORDER — TRAMADOL HCL 50 MG PO TABS
50.0000 mg | ORAL_TABLET | Freq: Once | ORAL | Status: AC
  Administered 2020-11-20: 50 mg via ORAL
  Filled 2020-11-20: qty 1

## 2020-11-20 MED ORDER — ENOXAPARIN SODIUM 40 MG/0.4ML ~~LOC~~ SOLN: 40.0000 mg | SUBCUTANEOUS | Status: DC

## 2020-11-20 MED ORDER — ENOXAPARIN SODIUM 30 MG/0.3ML ~~LOC~~ SOLN
30.0000 mg | SUBCUTANEOUS | Status: DC
  Administered 2020-11-20 – 2020-11-21 (×2): 30 mg via SUBCUTANEOUS
  Filled 2020-11-20 (×3): qty 0.3

## 2020-11-20 MED ORDER — MORPHINE SULFATE (PF) 2 MG/ML IV SOLN
1.0000 mg | INTRAVENOUS | Status: DC | PRN
  Administered 2020-11-20 – 2020-11-21 (×3): 1 mg via INTRAVENOUS
  Filled 2020-11-20 (×5): qty 1

## 2020-11-20 MED ORDER — ACETAMINOPHEN 325 MG PO TABS
650.0000 mg | ORAL_TABLET | Freq: Four times a day (QID) | ORAL | Status: DC | PRN
  Administered 2020-11-21 – 2020-11-22 (×2): 650 mg via ORAL
  Filled 2020-11-20 (×2): qty 2

## 2020-11-20 MED ORDER — MORPHINE SULFATE (PF) 2 MG/ML IV SOLN
2.0000 mg | Freq: Once | INTRAVENOUS | Status: AC
  Administered 2020-11-20: 2 mg via INTRAVENOUS
  Filled 2020-11-20: qty 1

## 2020-11-20 MED ORDER — PANTOPRAZOLE SODIUM 40 MG PO TBEC
40.0000 mg | DELAYED_RELEASE_TABLET | Freq: Every day | ORAL | Status: DC
  Administered 2020-11-20 – 2020-11-22 (×3): 40 mg via ORAL
  Filled 2020-11-20 (×3): qty 1

## 2020-11-20 NOTE — Telephone Encounter (Signed)
Transition Care Management Unsuccessful Follow-up Telephone Call  Date of discharge and from where:  11/17/20 from [facility]  Attempts:  2nd Attempt  Reason for unsuccessful TCM follow-up call:  Unable to reach patient

## 2020-11-20 NOTE — H&P (Addendum)
History and Physical    Christy Cisneros [id number] DOB: 01-13-1920 DOA: 11/20/2020  PCP: Einar Pheasant, MD  Patient coming from: Home   I have personally briefly reviewed patient's old medical records in [place]  Chief Complaint: Fall  HPI: Christy Cisneros is a 87 y.o. female with medical history significant for dementia, left eye blindness, CVA, Barrett's esophagus, Depression, GERD, HLD who presents for a fall.   Patient has dementia and does not recall the reason why she is here.  However she notes anterior upper quadrant abdominal and mid back pain.  Reportedly, patient was sitting on the porch and laughing with her friend so hard that she fell out of her chair onto the floor. She has had many recurrent falls in the past weeks.  She was last admitted from 4/15-4/19 for recurrent fall and had possible concussive symptoms.  She lives alone and has home services with a retired Corporate treasurer for 4 hours a day. PT during last admission recommends 24 hrs assistance and supervision. She has stable friends that live near her but no family close by.  She has a niece that lives in [place].   ED Course: She was hemodynamically stable.  CT of the T-spine shows mild T8 compression fracture. Lab work pending at the time of admission.  Review of Systems:  Unable to obtain due to patient dementia    Past Medical History:  Diagnosis Date  . Barrett's esophagus with esophagitis   . Chronic bronchitis ([place])   . Depression   . GERD (gastroesophageal reflux disease)   . Glaucoma   . Hypercholesteremia   . Osteoarthritis   . Osteoporosis   . Sleep apnea    has CPAP  . TIA (transient ischemic attack)     Past Surgical History:  Procedure Laterality Date  . BUNIONECTOMY     left  . ROTATOR CUFF REPAIR     right     reports that she has never smoked. She has never used smokeless tobacco. She reports current alcohol use. She reports that she does not use drugs. Social  History  Allergies  Allergen Reactions  . Bacitracin Other (See Comments)    Tearing, itching, swelling, redness Other Reaction: tearing, swollen red eyes Tearing, itching, swelling, redness  . Cephalexin Other (See Comments)    HA, nausea, gas, abdominal pain Other reaction(s): Abdominal Pain, Other (See Comments) Other Reaction: N&V HA, nausea, gas, abdominal pain  . Codeine Other (See Comments)    HA, nausea, gas, abdominal pain Other reaction(s): Abdominal Pain Other Reaction: N&V HA, nausea, gas, abdominal pain  . Avelox [Moxifloxacin Hcl In Nacl] Other (See Comments)    Dizziness, weakness  . Cefdinir Nausea And Vomiting  . Clarithromycin     HA, nausea, gas, abdominal pain  . Hydrocodone-Acetaminophen     REACTION: chest pain, constipation, nausea  . Lansoprazole     REACTION: cramps, nausea, diarrhea  . Polymyxin B     Itching and redness  . Vigamox [Moxifloxacin] Itching    Soreness, irritability, loss of appetite,  Dizziness, diarrhea, muscle weakness  . Brimonidine Itching    Burning, stinging, bloody eye, dry mouth Burning, stinging, bloody eye, dry mouth Burning, stinging, bloody eye, dry mouth  . Erythromycin Rash    HA, nausea, gas, abdominal pain HA, nausea, gas, abdominal pain    Family History  Problem Relation Age of Onset  . Breast cancer Sister   . Heart disease Mother   . Alcohol abuse Father  Prior to Admission medications   Medication Sig Start Date End Date Taking? Authorizing Provider  acetaminophen (TYLENOL) 325 MG tablet Take 2 tablets (650 mg total) by mouth every 8 (eight) hours as needed for mild pain. 07/10/18   Jodelle Green, FNP  mupirocin ointment (BACTROBAN) 2 % Apply to affected area on toe bid 10/08/20   Einar Pheasant, MD  omeprazole (PRILOSEC) 20 MG capsule TAKE ONE CAPSULE TWICE A DAY BEFORE MEALS 11/17/16   Einar Pheasant, MD  tobramycin (TOBREX) 0.3 % ophthalmic solution  11/02/17   [provider]     Physical Exam: Vitals:   11/20/20 1830 11/20/20 1845 11/20/20 1900 11/20/20 1915  BP:  (!) 150/95 (!) 153/105   Pulse: 87 93 82 79  Resp: (!) 22 (!) 22 (!) 21 18  Temp:      TempSrc:      SpO2: 97% 92% 100% 98%  Weight:      Height:        Constitutional: NAD, calm, comfortable, thin elderly lady laying flat in bed Vitals:   11/20/20 1830 11/20/20 1845 11/20/20 1900 11/20/20 1915  BP:  (!) 150/95 (!) 153/105   Pulse: 87 93 82 79  Resp: (!) 22 (!) 22 (!) 21 18  Temp:      TempSrc:      SpO2: 97% 92% 100% 98%  Weight:      Height:       Eyes: PERRL, lids and conjunctivae normal on right. Left eye closed.  ENMT: Mucous membranes are moist.  Neck: normal, supple Respiratory: clear to auscultation bilaterally, no wheezing, no crackles. Normal respiratory effort. No accessory muscle use.  Cardiovascular: Regular rate and rhythm, no murmurs / rubs / gallops. No extremity edema.  Abdomen: no tenderness, no masses palpated.  Bowel sounds positive.  Musculoskeletal: no clubbing / cyanosis. No joint deformity upper and lower extremities. Good ROM, no contractures. Normal muscle tone.  Skin: thinning brittle skin with diffuse ecchymosis Neurologic: CN 2-12 grossly intact. Sensation intact, Strength 5/5 in all 4.  Psychiatric: Alert and oriented to self and place. Normal mood.     Labs on Admission: I have personally reviewed following labs and imaging studies  CBC: Recent Labs  Lab 11/15/20 0556 11/20/20 1854  WBC 8.9 8.6  HGB 14.5 12.5  HCT 43.8 38.2  MCV 92.0 93.9  PLT 298 [id number]   Basic Metabolic Panel: Recent Labs  Lab 11/15/20 0556 11/16/20 0810 11/20/20 1854  NA 137 136 139  K 3.9 3.9 3.7  CL 104 105 105  CO2 22 23 26   GLUCOSE 97 103* 86  BUN 17 19 38*  CREATININE 0.84 0.82 0.88  CALCIUM 8.7* 8.4* 9.4  PHOS  --  2.6  --    GFR: Estimated Creatinine Clearance: 24.3 mL/min (by C-G formula based on SCr of 0.88 mg/dL). Liver Function Tests: Recent Labs   Lab 11/16/20 0810 11/20/20 1854  AST  --  24  ALT  --  28  ALKPHOS  --  55  BILITOT  --  0.6  PROT  --  6.3*  ALBUMIN 3.7 3.6   No results for input(s): LIPASE, AMYLASE in the last 168 hours. No results for input(s): AMMONIA in the last 168 hours. Coagulation Profile: No results for input(s): INR, PROTIME in the last 168 hours. Cardiac Enzymes: No results for input(s): CKTOTAL, CKMB, CKMBINDEX, TROPONINI in the last 168 hours. BNP (last 3 results) No results for input(s): PROBNP in the last 8760 hours.  HbA1C: No results for input(s): HGBA1C in the last 72 hours. CBG: No results for input(s): GLUCAP in the last 168 hours. Lipid Profile: No results for input(s): CHOL, HDL, LDLCALC, TRIG, CHOLHDL, LDLDIRECT in the last 72 hours. Thyroid Function Tests: No results for input(s): TSH, T4TOTAL, FREET4, T3FREE, THYROIDAB in the last 72 hours. Anemia Panel: No results for input(s): VITAMINB12, FOLATE, FERRITIN, TIBC, IRON, RETICCTPCT in the last 72 hours. Urine analysis:    Component Value Date/Time   COLORURINE YELLOW (A) 11/13/2020 1539   APPEARANCEUR CLEAR (A) 11/13/2020 1539   APPEARANCEUR Clear 12/06/2012 1257   LABSPEC 1.014 11/13/2020 1539   LABSPEC 1.008 12/06/2012 1257   PHURINE 7.0 11/13/2020 1539   GLUCOSEU NEGATIVE 11/13/2020 1539   GLUCOSEU NEGATIVE 08/30/2017 1231   HGBUR NEGATIVE 11/13/2020 1539   BILIRUBINUR NEGATIVE 11/13/2020 1539   BILIRUBINUR neg 08/07/2017 1014   BILIRUBINUR Negative 12/06/2012 [place] 11/13/2020 1539   PROTEINUR NEGATIVE 11/13/2020 1539   UROBILINOGEN 0.2 08/30/2017 1231   NITRITE NEGATIVE 11/13/2020 1539   LEUKOCYTESUR NEGATIVE 11/13/2020 1539   LEUKOCYTESUR Negative 12/06/2012 1257    Radiological Exams on Admission: DG Tibia/Fibula Left  Result Date: 11/20/2020 CLINICAL DATA:  Fall with injury EXAM: LEFT TIBIA AND FIBULA - 2 VIEW COMPARISON:  Right ankle radiograph April 28, 2014 FINDINGS: There is no  evidence of fracture or other focal bone lesions. Soft tissues are unremarkable. Chondrocalcinosis with degenerative changes in the knee, suggestive of CPPD arthropathy. Vascular calcifications. IMPRESSION: 1. No acute osseous abnormality. Electronically Signed   By: Dahlia Bailiff MD   On: 11/20/2020 18:38   DG Tibia/Fibula Right  Result Date: 11/20/2020 CLINICAL DATA:  Fall with pain EXAM: RIGHT TIBIA AND FIBULA - 2 VIEW COMPARISON:  Right ankle radiograph April 28, 2014 FINDINGS: There is no evidence of fracture or other focal bone lesions. Soft tissues are unremarkable. Chondrocalcinosis with degenerative change of the knee as can be seen with CPPD arthropathy. Vascular calcifications. IMPRESSION: 1. No acute osseous abnormality. Electronically Signed   By: Dahlia Bailiff MD   On: 11/20/2020 18:39   CT Chest Wo Contrast  Result Date: 11/20/2020 CLINICAL DATA:  87 year old post fall out of chair while laughing on the porch with neighbor. Rib pain. EXAM: CT CHEST WITHOUT CONTRAST TECHNIQUE: Multidetector CT imaging of the chest was performed following the standard protocol without IV contrast. COMPARISON:  Chest radiograph 11/13/2020. Included portion from abdominopelvic CT 11/14/2020. FINDINGS: Cardiovascular: Aortic atherosclerosis and tortuosity. No periaortic stranding. Heart is upper normal in size. Coronary artery calcifications. No pericardial effusion. Mediastinum/Nodes: No evidence of mediastinal hemorrhage or hematoma. No gross adenopathy, partially motion obscured. Small hiatal hernia without esophageal wall thickening. Lungs/Pleura: Small right pleural effusion which is similar to abdominal CT last week. Adjacent compressive atelectasis. No pneumothorax. Mild chronic hyperinflation. Linear atelectasis or scarring in the left lower lobe. Trachea and central bronchi are grossly negative, partially obscured by breathing motion artifact. Tiny right upper lobe pulmonary nodule, series 3, image  29, stable from 05/24/2012 chest CT and considered benign. Upper Abdomen: No acute or unexpected findings. Musculoskeletal: Thoracic spine assessed on dedicated thoracic spine CT, reported separately. Left shoulder arthroplasty. No acute displaced rib fracture. Motion artifact limits assessment for nondisplaced fractures. There is a remote fracture of the right posterior tenth and eleventh ribs. Degenerative change about the right shoulder. IMPRESSION: 1. No evidence of acute traumatic injury to the thorax, allowing for motion artifact. No displaced rib fracture 2. Small right pleural effusion  and adjacent compressive atelectasis, similar to abdominal CT last week. 3. Aortic atherosclerosis.  Coronary artery calcifications. Aortic Atherosclerosis (ICD10-I70.0) . Electronically Signed   By: Keith Rake M.D.   On: 11/20/2020 18:23   CT T-SPINE NO CHARGE  Result Date: 11/20/2020 CLINICAL DATA:  Back and rib pain after a fall. EXAM: CT THORACIC SPINE WITHOUT CONTRAST TECHNIQUE: Multidetector CT images of the thoracic were obtained using the standard protocol without intravenous contrast. COMPARISON:  Chest CT 05/24/2012.  Chest radiographs 09/15/2016. FINDINGS: Alignment: Mild S-shaped thoracolumbar scoliosis. Grade 1 anterolisthesis of C5 on C6, C6 on C7, and C7 on T1, likely degenerative and facet mediated. Increased thoracic kyphosis. Vertebrae: T8 superior endplate compression fracture with 20% vertebral body height loss, new from 2018 and suspected to be recent as there is the suggestion of a visible fracture line as well as mild paravertebral soft tissue hematoma. No retropulsion or posterior element fracture. Diffuse osteopenia. Paraspinal and other soft tissues: Small paravertebral hematoma at T8. Intrathoracic contents reported separately. Disc levels: Advanced cervical disc degeneration at C5-6 and C6-7. Moderate to severe facet arthrosis in the lower cervical and upper thoracic spine. Asymmetrically  advanced right-sided disc degeneration at L1-2 and L2-3 with resultant right neural foraminal stenosis. No evidence of high-grade thoracic spinal canal stenosis. IMPRESSION: Mild T8 compression fracture, likely acute. Electronically Signed   By: Logan Bores M.D.   On: 11/20/2020 18:22      Assessment/Plan  T8 compression fracture s/p Recurrent Fall  PRN Tylenol and morphine for pain Consider TLSO brace if able to tolerate PT evaluation Spoke with Niece who is her POA regarding pt's need for 24hr care for safety- she is working on possibly increasing her home help hours or assisted living but reports that pt has been resistant to that.   Dementia pt alert and oriented only to self and place- likely her baseline  Hx of Barrett's and GERD On Dysphagia 1 diet per OT in previous admission     DVT prophylaxis:.Lovenox Code Status: DNR- confirmed with Niece who is POA Family Communication: Plan discussed with patient at bedside  disposition Plan: Home with observation Consults called:  Admission status: Observation  Level of care: Med-Surg  Status is: Observation  The patient remains OBS appropriate and will d/c before 2 midnights.  Dispo: The patient is from: Home              Anticipated d/c is to: Home              Patient currently is not medically stable to d/c.   Difficult to place patient No         Orene Desanctis DO [facility]   If 7PM-7AM, please contact night-coverage [email or web address]   11/20/2020, 7:34 PM

## 2020-11-20 NOTE — ED Triage Notes (Signed)
Pt to ED via EMS from home. Pt was laughing on the porch with her neighbor and fell out of the chair. Pt c/o of pain in back and rib cage. Pt is a/o x4.

## 2020-11-20 NOTE — ED Notes (Addendum)
Pt purse taken home by Rhunette Croft at this time.

## 2020-11-20 NOTE — Progress Notes (Signed)
PHARMACIST - PHYSICIAN COMMUNICATION  CONCERNING:  Enoxaparin (Lovenox) for DVT Prophylaxis    RECOMMENDATION: Patient was prescribed enoxaprin 40mg  q24 hours for VTE prophylaxis.   Filed Weights   11/20/20 1709  Weight: 49.9 kg (110 lb)    Body mass index is 22.22 kg/m.  Estimated Creatinine Clearance: 26.1 mL/min (by C-G formula based on SCr of 0.82 mg/dL).   Patient is candidate for enoxaparin 30mg  every 24 hours based on CrCl <42ml/min or Weight <45kg  DESCRIPTION: Pharmacy has adjusted enoxaparin dose per [facility] policy.  Patient is now receiving enoxaparin 30 mg every 24 hours    Vira Blanco, PharmD Clinical Pharmacist  11/20/2020 7:15 PM

## 2020-11-20 NOTE — ED Provider Notes (Signed)
[facility] Emergency Department Provider Note   ____________________________________________    I have reviewed the triage vital signs and the nursing notes.   HISTORY  Chief Complaint Fall     HPI Christy Cisneros is a 87 y.o. female with history as noted below who presents today after falling out of a chair while laughing, she complains of pain in her back and chest, she also complains of pain in her lower legs bilaterally.  Denies head injury.  No neck pain.  No nausea vomiting or abdominal pain.  Past Medical History:  Diagnosis Date  . Barrett's esophagus with esophagitis   . Chronic bronchitis (HCC)   . Depression   . GERD (gastroesophageal reflux disease)   . Glaucoma   . Hypercholesteremia   . Osteoarthritis   . Osteoporosis   . Sleep apnea    has CPAP  . TIA (transient ischemic attack)     Patient Active Problem List   Diagnosis Date Noted  . Compression fracture of T8 vertebra, initial encounter (HCC) 11/20/2020  . Fall 11/14/2020  . Dementia (HCC) 11/14/2020  . Encounter for completion of form with patient 10/10/2020  . Toe pain 06/07/2020  . Weight loss 03/25/2019  . Pain due to onychomycosis of toenails of both feet 03/11/2019  . Depression 05/06/2018  . Abrasion 02/28/2018  . History of CVA (cerebrovascular accident) 03/16/2017  . Memory change 09/04/2016  . Pneumonia 07/17/2016  . Mild cognitive impairment 05/15/2016  . Dizziness 04/21/2016  . Neck pain 02/11/2016  . Muscle cramps 11/14/2015  . Urine incontinence 08/16/2015  . Hematoma 08/16/2015  . Weakness 07/12/2015  . Headache 06/16/2015  . Unsteady gait 06/01/2015  . GERD (gastroesophageal reflux disease) 08/11/2014  . DYSPNEA 04/17/2009  . HYPERCHOLESTEROLEMIA 04/16/2009  . Obstructive sleep apnea 04/16/2009  . Glaucoma 04/16/2009  . Transient cerebral ischemia 04/16/2009  . BARRETTS ESOPHAGUS 04/16/2009  . OSTEOARTHRITIS 04/16/2009  . Osteoporosis  04/16/2009    Past Surgical History:  Procedure Laterality Date  . BUNIONECTOMY     left  . ROTATOR CUFF REPAIR     right    Prior to Admission medications   Medication Sig Start Date End Date Taking? Authorizing Provider  acetaminophen (TYLENOL) 325 MG tablet Take 2 tablets (650 mg total) by mouth every 8 (eight) hours as needed for mild pain. 07/10/18   Tracey HarriesGuse, Lauren M, FNP  mupirocin ointment (BACTROBAN) 2 % Apply to affected area on toe bid 10/08/20   Dale DurhamScott, Charlene, MD  omeprazole (PRILOSEC) 20 MG capsule TAKE ONE CAPSULE TWICE A DAY BEFORE MEALS 11/17/16   Dale DurhamScott, Charlene, MD  tobramycin (TOBREX) 0.3 % ophthalmic solution  11/02/17   [provider]     Allergies Bacitracin, Cephalexin, Codeine, Avelox [moxifloxacin hcl in nacl], Cefdinir, Clarithromycin, Hydrocodone-acetaminophen, Lansoprazole, Polymyxin b, Vigamox [moxifloxacin], Brimonidine, and Erythromycin  Family History  Problem Relation Age of Onset  . Breast cancer Sister   . Heart disease Mother   . Alcohol abuse Father     Social History Social History   Tobacco Use  . Smoking status: Never Smoker  . Smokeless tobacco: Never Used  Vaping Use  . Vaping Use: Never used  Substance Use Topics  . Alcohol use: Yes    Alcohol/week: 0.0 standard drinks    Comment: wine/brandy occasional  . Drug use: No    Review of Systems  Constitutional: No fever/chills Eyes: No visual changes.  ENT: No sore throat. Cardiovascular: Denies chest pain. Respiratory: Denies  shortness of breath. Gastrointestinal: As above Genitourinary: Negative for dysuria. Musculoskeletal: As above Skin: Negative for rash. Neurological: Negative for headaches or weakness   ____________________________________________   PHYSICAL EXAM:  VITAL SIGNS: ED Triage Vitals  Enc Vitals Group     BP 11/20/20 1707 128/81     Pulse Rate 11/20/20 1707 96     Resp 11/20/20 1707 18     Temp 11/20/20 1707 97.9 F (36.6 C)     Temp  Source 11/20/20 1707 Oral     SpO2 11/20/20 1707 96 %     Weight 11/20/20 1709 49.9 kg (110 lb)     Height 11/20/20 1709 1.499 m (4\' 11" )     Head Circumference --      Peak Flow --      Pain Score --      Pain Loc --      Pain Edu? --      Excl. in [place]? --     Constitutional: Alert, no acute distress, pleasant Eyes: Conjunctivae are normal.  Head: Atraumatic. Nose: No congestion/rhinnorhea. Mouth/Throat: Mucous membranes are moist.   Neck:  Painless ROM, no pain with axial load Cardiovascular: Normal rate, regular rhythm. Grossly normal heart sounds.  Good peripheral circulation.  No chest wall tenderness to palpation Respiratory: Normal respiratory effort.  No retractions. Lungs CTAB. Gastrointestinal: Soft and nontender. No distention.  No CVA tenderness. Genitourinary: deferred Musculoskeletal: Patient with tenderness along the shins bilaterally, no bony normalities palpated.  Mild vertebral tenderness around T10, pain in the back with movement Neurologic:  Normal speech and language. No gross focal neurologic deficits are appreciated.  Skin:  Skin is warm, dry and intact. No rash noted. Psychiatric: Mood and affect are normal. Speech and behavior are normal.  ____________________________________________   LABS (all labs ordered are listed, but only abnormal results are displayed)  Labs Reviewed  COMPREHENSIVE METABOLIC PANEL - Abnormal; Notable for the following components:      Result Value   BUN 38 (*)    Total Protein 6.3 (*)    GFR, Estimated 59 (*)    All other components within normal limits  SARS CORONAVIRUS 2 (TAT 6-24 HRS)  CBC  BASIC METABOLIC PANEL  CBC   ____________________________________________  EKG  ED ECG REPORT I, Lavonia Drafts, the attending physician, personally viewed and interpreted this ECG.  Date: 11/20/2020  Rhythm: normal sinus rhythm QRS Axis: normal Intervals: normal ST/T Wave abnormalities: normal Narrative Interpretation: no  evidence of acute ischemia  ____________________________________________  RADIOLOGY  CT chest thoracic spine demonstrates T8 compression fracture Tib-fib x-rays bilaterally no acute abnormality ____________________________________________   PROCEDURES  Procedure(s) performed: No  Procedures   Critical Care performed: No ____________________________________________   INITIAL IMPRESSION / ASSESSMENT AND PLAN / ED COURSE  Pertinent labs & imaging results that were available during my care of the patient were reviewed by me and considered in my medical decision making (see chart for details).  Patient presents with fall as detailed above, mild cognitive deficits restrict history somewhat.  Pain appears to be limited to the chest and back and lower legs no pain with axial load on both hips.  CT chest demonstrates T8 compression fracture, x-ray is reassuring Ultram given  Patient's pain is worsened, will have to give IV morphine  Given patient's age, likely mild dementia and the fact that she lives alone with significant pain I discussed with the hospitalist for admission    ____________________________________________   FINAL CLINICAL IMPRESSION(S) / ED DIAGNOSES  Final diagnoses:  Back pain  Compression fracture of T8 vertebra, initial encounter [facility])        Note:  This document was prepared using Dragon voice recognition software and may include unintentional dictation errors.   Lavonia Drafts, MD 11/20/20 2003

## 2020-11-21 ENCOUNTER — Encounter: Payer: Self-pay | Admitting: Family Medicine

## 2020-11-21 DIAGNOSIS — Z885 Allergy status to narcotic agent status: Secondary | ICD-10-CM | POA: Diagnosis not present

## 2020-11-21 DIAGNOSIS — H409 Unspecified glaucoma: Secondary | ICD-10-CM | POA: Diagnosis present

## 2020-11-21 DIAGNOSIS — G4730 Sleep apnea, unspecified: Secondary | ICD-10-CM | POA: Diagnosis present

## 2020-11-21 DIAGNOSIS — Z66 Do not resuscitate: Secondary | ICD-10-CM | POA: Diagnosis present

## 2020-11-21 DIAGNOSIS — S22060A Wedge compression fracture of T7-T8 vertebra, initial encounter for closed fracture: Secondary | ICD-10-CM | POA: Diagnosis present

## 2020-11-21 DIAGNOSIS — Z79899 Other long term (current) drug therapy: Secondary | ICD-10-CM | POA: Diagnosis not present

## 2020-11-21 DIAGNOSIS — F32A Depression, unspecified: Secondary | ICD-10-CM | POA: Diagnosis present

## 2020-11-21 DIAGNOSIS — I2510 Atherosclerotic heart disease of native coronary artery without angina pectoris: Secondary | ICD-10-CM | POA: Diagnosis present

## 2020-11-21 DIAGNOSIS — Z881 Allergy status to other antibiotic agents status: Secondary | ICD-10-CM | POA: Diagnosis not present

## 2020-11-21 DIAGNOSIS — Z8249 Family history of ischemic heart disease and other diseases of the circulatory system: Secondary | ICD-10-CM | POA: Diagnosis not present

## 2020-11-21 DIAGNOSIS — Z9181 History of falling: Secondary | ICD-10-CM | POA: Diagnosis not present

## 2020-11-21 DIAGNOSIS — M549 Dorsalgia, unspecified: Secondary | ICD-10-CM | POA: Diagnosis not present

## 2020-11-21 DIAGNOSIS — Z888 Allergy status to other drugs, medicaments and biological substances status: Secondary | ICD-10-CM | POA: Diagnosis not present

## 2020-11-21 DIAGNOSIS — Z8673 Personal history of transient ischemic attack (TIA), and cerebral infarction without residual deficits: Secondary | ICD-10-CM | POA: Diagnosis not present

## 2020-11-21 DIAGNOSIS — K219 Gastro-esophageal reflux disease without esophagitis: Secondary | ICD-10-CM | POA: Diagnosis present

## 2020-11-21 DIAGNOSIS — E7800 Pure hypercholesterolemia, unspecified: Secondary | ICD-10-CM | POA: Diagnosis present

## 2020-11-21 DIAGNOSIS — W07XXXA Fall from chair, initial encounter: Secondary | ICD-10-CM | POA: Diagnosis present

## 2020-11-21 DIAGNOSIS — R296 Repeated falls: Secondary | ICD-10-CM | POA: Diagnosis present

## 2020-11-21 DIAGNOSIS — H5462 Unqualified visual loss, left eye, normal vision right eye: Secondary | ICD-10-CM | POA: Diagnosis present

## 2020-11-21 DIAGNOSIS — E785 Hyperlipidemia, unspecified: Secondary | ICD-10-CM | POA: Diagnosis present

## 2020-11-21 DIAGNOSIS — K2270 Barrett's esophagus without dysplasia: Secondary | ICD-10-CM | POA: Diagnosis present

## 2020-11-21 DIAGNOSIS — M810 Age-related osteoporosis without current pathological fracture: Secondary | ICD-10-CM | POA: Diagnosis present

## 2020-11-21 DIAGNOSIS — Z20822 Contact with and (suspected) exposure to covid-19: Secondary | ICD-10-CM | POA: Diagnosis present

## 2020-11-21 DIAGNOSIS — J42 Unspecified chronic bronchitis: Secondary | ICD-10-CM | POA: Diagnosis present

## 2020-11-21 DIAGNOSIS — F0390 Unspecified dementia without behavioral disturbance: Secondary | ICD-10-CM | POA: Diagnosis present

## 2020-11-21 LAB — BASIC METABOLIC PANEL
Anion gap: 8 (ref 5–15)
BUN: 27 mg/dL — ABNORMAL HIGH (ref 8–23)
CO2: 27 mmol/L (ref 22–32)
Calcium: 8.6 mg/dL — ABNORMAL LOW (ref 8.9–10.3)
Chloride: 104 mmol/L (ref 98–111)
Creatinine, Ser: 0.77 mg/dL (ref 0.44–1.00)
GFR, Estimated: >60 mL/min (ref >60)
Glucose, Bld: 101 mg/dL — ABNORMAL HIGH (ref 70–99)
Potassium: 4.3 mmol/L (ref 3.5–5.1)
Sodium: 139 mmol/L (ref 135–145)

## 2020-11-21 LAB — CBC
HCT: 37.7 % (ref 36.0–46.0)
Hemoglobin: 12.6 g/dL (ref 12.0–15.0)
MCH: 31 pg (ref 26.0–34.0)
MCHC: 33.4 g/dL (ref 30.0–36.0)
MCV: 92.9 fL (ref 80.0–100.0)
Platelets: 270 10*3/uL (ref 150–400)
RBC: 4.06 MIL/uL (ref 3.87–5.11)
RDW: 13.8 % (ref 11.5–15.5)
WBC: 7.3 10*3/uL (ref 4.0–10.5)
nRBC: 0 % (ref 0.0–0.2)

## 2020-11-21 LAB — SARS CORONAVIRUS 2 (TAT 6-24 HRS): SARS Coronavirus 2: NEGATIVE

## 2020-11-21 NOTE — Plan of Care (Signed)

## 2020-11-21 NOTE — Evaluation (Signed)
Physical Therapy Evaluation Patient Details Name: Christy Cisneros MRN: [id number] DOB: May 16, 1920 Today's Date: 11/21/2020   History of Present Illness  Pt is a 87 y.o. female with medical history significant for   TIA, osteoarthritis, glaucoma, GERD, depression, chronic bronchitis, sleep apnea who was brought into the ER by EMS after she fell. Complaining of sternum, rib, and back pain. Imaging negative, including CT.  Clinical Impression  Pt is assisted to get OOB with review of back precautions, then to walk on RW and sit with support in a chair.  Pt is expressing some upset feelings about an interaction with someone but cannot remember who this is, nor where it happened.  Her distractions in conjunction with being HOH are making it a challenge to teach safety to her regarding mobility, as well as having postural imbalance in standing.  Follow acutely for goals of PT and recommend a rehab stay as pt is unable to safely walk alone and cannot clearly recall precautions.    Follow Up Recommendations SNF    Equipment Recommendations  Rolling walker with 5" wheels    Recommendations for Other Services       Precautions / Restrictions Precautions Precautions: Fall;Back Precaution Booklet Issued: No Precaution Comments: reviewed precautions, pt cannot recall them Required Braces or Orthoses:  (no brace ordered) Restrictions Weight Bearing Restrictions: No      Mobility  Bed Mobility Overal bed mobility: Needs Assistance Bed Mobility: Sidelying to Sit   Sidelying to sit: Min assist            Transfers Overall transfer level: Needs assistance Equipment used: Rolling walker (2 wheeled);1 person hand held assist Transfers: Sit to/from Stand Sit to Stand: Min assist         General transfer comment: min assist with cues for hand placement  Ambulation/Gait Ambulation/Gait assistance: Min guard;Min assist Gait Distance (Feet): 24 Feet Assistive device: Rolling walker  (2 wheeled) Gait Pattern/deviations: Step-through pattern;Decreased stride length Gait velocity: variable   General Gait Details: instruction for safety with walker including turns and set up to sit  Stairs            Wheelchair Mobility    Modified Rankin (Stroke Patients Only)       Balance Overall balance assessment: Needs assistance Sitting-balance support: Feet supported Sitting balance-Leahy Scale: Fair   Postural control: Posterior lean Standing balance support: Bilateral upper extremity supported;During functional activity Standing balance-Leahy Scale: Poor                               Pertinent Vitals/Pain Pain Assessment: Faces Faces Pain Scale: Hurts little more Pain Location: back Pain Descriptors / Indicators: Guarding Pain Intervention(s): Limited activity within patient's tolerance;Monitored during session;Premedicated before session;Repositioned    Home Living Family/patient expects to be discharged to:: Private residence Living Arrangements: Alone Available Help at Discharge: Family;Friend(s);Available PRN/intermittently;Personal care attendant Type of Home: Apartment Home Access: Stairs to enter Entrance Stairs-Rails: Left Entrance Stairs-Number of Steps: 3 Home Layout: One level Home Equipment: Cane - single point Additional Comments: pt is distracted by concerns of being upset with a staff member and is not sure if they even work for the hospital    Prior Function Level of Independence: Needs assistance   Gait / Transfers Assistance Needed: ambulates with walking sticks, goes on daily walks  ADL's / Homemaking Assistance Needed: Self care is independent        Hand Dominance  Dominant Hand: Right    Extremity/Trunk Assessment   Upper Extremity Assessment Upper Extremity Assessment: Generalized weakness    Lower Extremity Assessment Lower Extremity Assessment: Generalized weakness    Cervical / Trunk  Assessment Cervical / Trunk Assessment: Kyphotic  Communication   Communication: HOH  Cognition Arousal/Alertness: Awake/alert Behavior During Therapy: Impulsive Overall Cognitive Status: Within Functional Limits for tasks assessed                                        General Comments General comments (skin integrity, edema, etc.): pt is relatively unsafe to walk on RW due to losing control of walker    Exercises Other Exercises Other Exercises: strength in LE's is 3+ to 4+   Assessment/Plan    PT Assessment Patient needs continued PT services  PT Problem List Decreased strength;Decreased range of motion;Decreased activity tolerance;Decreased balance;Decreased mobility;Decreased coordination;Decreased cognition;Decreased safety awareness;Decreased knowledge of use of DME;Pain       PT Treatment Interventions DME instruction;Therapeutic exercise;Gait training;Balance training;Stair training;Neuromuscular re-education;Therapeutic activities;Patient/family education    PT Goals (Current goals can be found in the Care Plan section)  Acute Rehab PT Goals Patient Stated Goal: to walk and get home PT Goal Formulation: With patient Time For Goal Achievement: 12/05/20 Potential to Achieve Goals: Good    Frequency Min 2X/week   Barriers to discharge Decreased caregiver support home alone    Co-evaluation               AM-PAC PT "6 Clicks" Mobility  Outcome Measure Help needed turning from your back to your side while in a flat bed without using bedrails?: A Little Help needed moving from lying on your back to sitting on the side of a flat bed without using bedrails?: A Little Help needed moving to and from a bed to a chair (including a wheelchair)?: A Little Help needed standing up from a chair using your arms (e.g., wheelchair or bedside chair)?: A Little Help needed to walk in hospital room?: A Little Help needed climbing 3-5 steps with a railing? : A  Lot 6 Click Score: 17    End of Session Equipment Utilized During Treatment: Gait belt Activity Tolerance: Patient limited by fatigue Patient left: in chair;with chair alarm set;with call bell/phone within reach Nurse Communication: Mobility status PT Visit Diagnosis: Muscle weakness (generalized) (M62.81);Difficulty in walking, not elsewhere classified (R26.2);Unsteadiness on feet (R26.81)    Time: 3846-6599 PT Time Calculation (min) (ACUTE ONLY): 28 min   Charges:   PT Evaluation $PT Eval Moderate Complexity: 1 Mod PT Treatments $Gait Training: 8-22 mins       Ramond Dial 11/21/2020, 9:34 PM Mee Hives, PT MS Acute Rehab Dept. Number: [facility] [place]

## 2020-11-21 NOTE — Progress Notes (Signed)
[place] [facility] [place] NAME: Christy Cisneros    MR#:  [id number]  DATE OF BIRTH:  April 06, 1920  SUBJECTIVE:  patient came in with mechanical fall from home. She has issues with memory does not remember much. Met with friend Christy Cisneros in the room. Denies any back pain. Eating breakfast.  REVIEW OF SYSTEMS:   Review of Systems  Constitutional: Negative for chills, fever and weight loss.  HENT: Negative for ear discharge, ear pain and nosebleeds.   Eyes: Negative for blurred vision, pain and discharge.  Respiratory: Negative for sputum production, shortness of breath, wheezing and stridor.   Cardiovascular: Negative for chest pain, palpitations, orthopnea and PND.  Gastrointestinal: Negative for abdominal pain, diarrhea, nausea and vomiting.  Genitourinary: Negative for frequency and urgency.  Musculoskeletal: Positive for falls. Negative for joint pain.  Neurological: Positive for weakness. Negative for sensory change, speech change and focal weakness.  Psychiatric/Behavioral: Negative for depression and hallucinations. The patient is not nervous/anxious.    Tolerating Diet:yes  Tolerating PT: pending  DRUG ALLERGIES:   Allergies  Allergen Reactions  . Bacitracin Other (See Comments)    Tearing, itching, swelling, redness Other Reaction: tearing, swollen red eyes Tearing, itching, swelling, redness  . Cephalexin Other (See Comments)    HA, nausea, gas, abdominal pain Other reaction(s): Abdominal Pain, Other (See Comments) Other Reaction: N&V HA, nausea, gas, abdominal pain  . Codeine Other (See Comments)    HA, nausea, gas, abdominal pain Other reaction(s): Abdominal Pain Other Reaction: N&V HA, nausea, gas, abdominal pain  . Avelox [Moxifloxacin Hcl In Nacl] Other (See Comments)    Dizziness, weakness  . Cefdinir Nausea And Vomiting  . Clarithromycin     HA, nausea, gas, abdominal pain  . Hydrocodone-Acetaminophen     REACTION:  chest pain, constipation, nausea  . Lansoprazole     REACTION: cramps, nausea, diarrhea  . Polymyxin B     Itching and redness  . Vigamox [Moxifloxacin] Itching    Soreness, irritability, loss of appetite,  Dizziness, diarrhea, muscle weakness  . Brimonidine Itching    Burning, stinging, bloody eye, dry mouth Burning, stinging, bloody eye, dry mouth Burning, stinging, bloody eye, dry mouth  . Erythromycin Rash    HA, nausea, gas, abdominal pain HA, nausea, gas, abdominal pain    VITALS:  Blood pressure 131/73, pulse 70, temperature 97.6 F (36.4 C), temperature source Oral, resp. rate 18, height 5' (1.524 m), weight 45 kg, SpO2 99 %.  PHYSICAL EXAMINATION:   Physical Exam  GENERAL:  87 y.o.-year-old patient lying in the bed with no acute distress.  LUNGS: Normal breath sounds bilaterally, no wheezing, rales, rhonchi. No use of accessory muscles of respiration.  CARDIOVASCULAR: S1, S2 normal. No murmurs, rubs, or gallops.  ABDOMEN: Soft, nontender, nondistended. Bowel sounds present. No organomegaly or mass.  EXTREMITIES: No cyanosis, clubbing or edema b/l.    NEUROLOGIC: grossly nonfocal moves all extremities well. PSYCHIATRIC:  patient is alert and awake. Answers most questions appropriately. Has issues with memory SKIN: No obvious rash, lesion, or ulcer.   LABORATORY PANEL:  CBC Recent Labs  Lab 11/21/20 0451  WBC 7.3  HGB 12.6  HCT 37.7  PLT 270    Chemistries  Recent Labs  Lab 11/20/20 1854 11/21/20 0451  NA 139 139  K 3.7 4.3  CL 105 104  CO2 26 27  GLUCOSE 86 101*  BUN 38* 27*  CREATININE 0.88 0.77  CALCIUM 9.4 8.6*  AST 24  --  ALT 28  --   ALKPHOS 55  --   BILITOT 0.6  --    Cardiac Enzymes No results for input(s): TROPONINI in the last 168 hours. RADIOLOGY:  DG Tibia/Fibula Left  Result Date: 11/20/2020 CLINICAL DATA:  Fall with injury EXAM: LEFT TIBIA AND FIBULA - 2 VIEW COMPARISON:  Right ankle radiograph April 28, 2014 FINDINGS:  There is no evidence of fracture or other focal bone lesions. Soft tissues are unremarkable. Chondrocalcinosis with degenerative changes in the knee, suggestive of CPPD arthropathy. Vascular calcifications. IMPRESSION: 1. No acute osseous abnormality. Electronically Signed   By: Dahlia Bailiff MD   On: 11/20/2020 18:38   DG Tibia/Fibula Right  Result Date: 11/20/2020 CLINICAL DATA:  Fall with pain EXAM: RIGHT TIBIA AND FIBULA - 2 VIEW COMPARISON:  Right ankle radiograph April 28, 2014 FINDINGS: There is no evidence of fracture or other focal bone lesions. Soft tissues are unremarkable. Chondrocalcinosis with degenerative change of the knee as can be seen with CPPD arthropathy. Vascular calcifications. IMPRESSION: 1. No acute osseous abnormality. Electronically Signed   By: Dahlia Bailiff MD   On: 11/20/2020 18:39   CT Chest Wo Contrast  Result Date: 11/20/2020 CLINICAL DATA:  87 year old post fall out of chair while laughing on the porch with neighbor. Rib pain. EXAM: CT CHEST WITHOUT CONTRAST TECHNIQUE: Multidetector CT imaging of the chest was performed following the standard protocol without IV contrast. COMPARISON:  Chest radiograph 11/13/2020. Included portion from abdominopelvic CT 11/14/2020. FINDINGS: Cardiovascular: Aortic atherosclerosis and tortuosity. No periaortic stranding. Heart is upper normal in size. Coronary artery calcifications. No pericardial effusion. Mediastinum/Nodes: No evidence of mediastinal hemorrhage or hematoma. No gross adenopathy, partially motion obscured. Small hiatal hernia without esophageal wall thickening. Lungs/Pleura: Small right pleural effusion which is similar to abdominal CT last week. Adjacent compressive atelectasis. No pneumothorax. Mild chronic hyperinflation. Linear atelectasis or scarring in the left lower lobe. Trachea and central bronchi are grossly negative, partially obscured by breathing motion artifact. Tiny right upper lobe pulmonary nodule,  series 3, image 29, stable from 05/24/2012 chest CT and considered benign. Upper Abdomen: No acute or unexpected findings. Musculoskeletal: Thoracic spine assessed on dedicated thoracic spine CT, reported separately. Left shoulder arthroplasty. No acute displaced rib fracture. Motion artifact limits assessment for nondisplaced fractures. There is a remote fracture of the right posterior tenth and eleventh ribs. Degenerative change about the right shoulder. IMPRESSION: 1. No evidence of acute traumatic injury to the thorax, allowing for motion artifact. No displaced rib fracture 2. Small right pleural effusion and adjacent compressive atelectasis, similar to abdominal CT last week. 3. Aortic atherosclerosis.  Coronary artery calcifications. Aortic Atherosclerosis (ICD10-I70.0) . Electronically Signed   By: Keith Rake M.D.   On: 11/20/2020 18:23   CT T-SPINE NO CHARGE  Result Date: 11/20/2020 CLINICAL DATA:  Back and rib pain after a fall. EXAM: CT THORACIC SPINE WITHOUT CONTRAST TECHNIQUE: Multidetector CT images of the thoracic were obtained using the standard protocol without intravenous contrast. COMPARISON:  Chest CT 05/24/2012.  Chest radiographs 09/15/2016. FINDINGS: Alignment: Mild S-shaped thoracolumbar scoliosis. Grade 1 anterolisthesis of C5 on C6, C6 on C7, and C7 on T1, likely degenerative and facet mediated. Increased thoracic kyphosis. Vertebrae: T8 superior endplate compression fracture with 20% vertebral body height loss, new from 2018 and suspected to be recent as there is the suggestion of a visible fracture line as well as mild paravertebral soft tissue hematoma. No retropulsion or posterior element fracture. Diffuse osteopenia. Paraspinal and other soft tissues:  Small paravertebral hematoma at T8. Intrathoracic contents reported separately. Disc levels: Advanced cervical disc degeneration at C5-6 and C6-7. Moderate to severe facet arthrosis in the lower cervical and upper thoracic spine.  Asymmetrically advanced right-sided disc degeneration at L1-2 and L2-3 with resultant right neural foraminal stenosis. No evidence of high-grade thoracic spinal canal stenosis. IMPRESSION: Mild T8 compression fracture, likely acute. Electronically Signed   By: Logan Bores M.D.   On: 11/20/2020 18:22   ASSESSMENT AND PLAN:  Christy Cisneros is a 87 y.o. female with medical history significant for dementia, left eye blindness, CVA, Barrett's esophagus, Depression, GERD, HLD who presents for a fall.   T8 compression fracture s/p Recurrent Fall  --PRN Tylenol and morphine for pain --Consider TLSO brace if able to tolerate --PT evaluation --Dr Flossie Buffy spoke  with Niece who is her POA regarding pt's need for 24hr care for safety- she is working on possibly increasing her home help hours or assisted living but reports that pt has been resistant to that.  --She has stable friends that live near her but no family close by--d/w Christy Cisneros, friend at bedisde  Dementia pt alert and oriented only to self and place- likely her baseline  Hx of Barrett's and GERD On Dysphagia 1 diet per OT in previous admission   Procedures: Family communication :Christy Cisneros (friend at bedside) Consults : CODE STATUS: DNR per HCPOA (noted on admisison) DVT Prophylaxis :lovnenox Level of care: Med-Surg Status is: Observation  The patient remains OBS appropriate and will d/c before 2 midnights.  Dispo: The patient is from: Home              Anticipated d/c is to: Home              Patient currently is not medically stable to d/c.   Difficult to place patient No  Fall and T 8 compression  fracture. PT/OT pending      TOTAL TIME TAKING CARE OF THIS PATIENT: 25 minutes.  >50% time spent on counselling and coordination of care  Note: This dictation was prepared with Dragon dictation along with smaller phrase technology. Any transcriptional errors that result from this process are unintentional.  Christy Cisneros M.D     [facility]   CC: Primary care physician; Einar Pheasant, MDPatient ID: Christy Cisneros, female   DOB: 01/17/1920, 87 y.o.   MRN: [id number]

## 2020-11-22 DIAGNOSIS — S22060A Wedge compression fracture of T7-T8 vertebra, initial encounter for closed fracture: Secondary | ICD-10-CM | POA: Diagnosis not present

## 2020-11-22 MED ORDER — TUBERCULIN PPD 5 UNIT/0.1ML ID SOLN
5.0000 [IU] | Freq: Once | INTRADERMAL | Status: DC
  Administered 2020-11-22: 5 [IU] via INTRADERMAL
  Filled 2020-11-22: qty 0.1

## 2020-11-22 NOTE — Progress Notes (Signed)
Patient is alert to self only. Christy Cisneros, arrived to transport patient home via private vehicle. AVS was reviewed and given to Amyia and Almyra Free who verbalized understanding via teach back re medications, follow up appointments, signs and symptoms to notify MD as well as limitations and restrictions. Referral has been made to [facility].

## 2020-11-22 NOTE — Discharge Summary (Signed)
[place] [facility] [place] NAME: Christy Cisneros    MR#:  [id number]  DATE OF BIRTH:  07-23-1920  DATE OF ADMISSION:  11/20/2020 ADMITTING PHYSICIAN: Orene Desanctis, DO  DATE OF DISCHARGE: 11/22/2020  PRIMARY CARE PHYSICIAN: Einar Pheasant, MD    ADMISSION DIAGNOSIS:  Back pain [M54.9] Compression fracture of T8 vertebra, initial encounter ([place]) [N56.213Y]  DISCHARGE DIAGNOSIS:  T 8 compression fracture post fall  SECONDARY DIAGNOSIS:   Past Medical History:  Diagnosis Date  . Barrett's esophagus with esophagitis   . Chronic bronchitis ([place])   . Depression   . GERD (gastroesophageal reflux disease)   . Glaucoma   . Hypercholesteremia   . Osteoarthritis   . Osteoporosis   . Sleep apnea    has CPAP  . TIA (transient ischemic attack)     HOSPITAL COURSE:   Christy Cisneros a 87 y.o.femalewith medical history significant fordementia, left eye blindness, CVA, Barrett's esophagus, Depression, GERD, HLD who presents for a fall.  T8 compression fracture s/p Recurrent Fall  --PRN Tylenol and morphine for pain --Consider TLSO brace if able to tolerate --PT evaluation --Dr Flossie Buffy spoke  with Niece who is her POA regarding pt's need for 24hr care for safety- she is working on possibly increasing her home help hours or assisted living but reports that pt has been resistant to that. --She has stable friends that live near her but no family close by  Dementia pt alert and oriented only to self and place- likely her baseline  Hx of Barrett's and GERD On Dysphagia 1 diet per OT in previous admission  D/w Pamala Hurry friend and [facility] Niece (in [place])--they prefer pt returning back to familiar environment. HHPT per TOC Family will increase her private sitter/help hours. They understand pt is at high risk for fall and need to think about AL or LTC (if able to) for near future. Pamala Hurry is requesting TB skin test be placed and she will make  arrangement for reading to be done as out pt--this in preparation if pt goes to ALF as outpt.   Family communication :Pamala Hurry on the phone Consults :none CODE STATUS: DNR per HCPOA (noted on admisison) DVT Prophylaxis :lovnenox Level of care: Med-Surg Status is:in patient  Dispo: The patient is from: Home  Anticipated d/c is to: Home. Family wants her to return to familiar environment  Patient currently is medically optimized and stable to d/c.              Difficult to place patient No    CONSULTS OBTAINED:    DRUG ALLERGIES:   Allergies  Allergen Reactions  . Bacitracin Other (See Comments)    Tearing, itching, swelling, redness Other Reaction: tearing, swollen red eyes Tearing, itching, swelling, redness  . Cephalexin Other (See Comments)    HA, nausea, gas, abdominal pain Other reaction(s): Abdominal Pain, Other (See Comments) Other Reaction: N&V HA, nausea, gas, abdominal pain  . Codeine Other (See Comments)    HA, nausea, gas, abdominal pain Other reaction(s): Abdominal Pain Other Reaction: N&V HA, nausea, gas, abdominal pain  . Avelox [Moxifloxacin Hcl In Nacl] Other (See Comments)    Dizziness, weakness  . Cefdinir Nausea And Vomiting  . Clarithromycin     HA, nausea, gas, abdominal pain  . Hydrocodone-Acetaminophen     REACTION: chest pain, constipation, nausea  . Lansoprazole     REACTION: cramps, nausea, diarrhea  . Polymyxin B     Itching and redness  .  Vigamox [Moxifloxacin] Itching    Soreness, irritability, loss of appetite,  Dizziness, diarrhea, muscle weakness  . Brimonidine Itching    Burning, stinging, bloody eye, dry mouth Burning, stinging, bloody eye, dry mouth Burning, stinging, bloody eye, dry mouth  . Erythromycin Rash    HA, nausea, gas, abdominal pain HA, nausea, gas, abdominal pain    DISCHARGE MEDICATIONS:   Allergies as of 11/22/2020      Reactions   Bacitracin Other (See Comments)   Tearing,  itching, swelling, redness Other Reaction: tearing, swollen red eyes Tearing, itching, swelling, redness   Cephalexin Other (See Comments)   HA, nausea, gas, abdominal pain Other reaction(s): Abdominal Pain, Other (See Comments) Other Reaction: N&V HA, nausea, gas, abdominal pain   Codeine Other (See Comments)   HA, nausea, gas, abdominal pain Other reaction(s): Abdominal Pain Other Reaction: N&V HA, nausea, gas, abdominal pain   Avelox [moxifloxacin Hcl In Nacl] Other (See Comments)   Dizziness, weakness   Cefdinir Nausea And Vomiting   Clarithromycin    HA, nausea, gas, abdominal pain   Hydrocodone-acetaminophen    REACTION: chest pain, constipation, nausea   Lansoprazole    REACTION: cramps, nausea, diarrhea   Polymyxin B    Itching and redness   Vigamox [moxifloxacin] Itching   Soreness, irritability, loss of appetite,  Dizziness, diarrhea, muscle weakness   Brimonidine Itching   Burning, stinging, bloody eye, dry mouth Burning, stinging, bloody eye, dry mouth Burning, stinging, bloody eye, dry mouth   Erythromycin Rash   HA, nausea, gas, abdominal pain HA, nausea, gas, abdominal pain      Medication List    STOP taking these medications   mupirocin ointment 2 % Commonly known as: BACTROBAN     TAKE these medications   acetaminophen 325 MG tablet Commonly known as: Tylenol Take 2 tablets (650 mg total) by mouth every 8 (eight) hours as needed for mild pain.   omeprazole 20 MG capsule Commonly known as: PRILOSEC TAKE ONE CAPSULE TWICE A DAY BEFORE MEALS   tobramycin 0.3 % ophthalmic solution Commonly known as: [place]            Discharge Care Instructions  (From admission, onward)         Start     Ordered   11/22/20 0000  Discharge wound care:       Comments: Continue wound care before   11/22/20 3664          If you experience worsening of your admission symptoms, develop shortness of breath, life threatening emergency, suicidal or  homicidal thoughts you must seek medical attention immediately by calling 911 or calling your MD immediately  if symptoms less severe.  You Must read complete instructions/literature along with all the possible adverse reactions/side effects for all the Medicines you take and that have been prescribed to you. Take any new Medicines after you have completely understood and accept all the possible adverse reactions/side effects.   Please note  You were cared for by a hospitalist during your hospital stay. If you have any questions about your discharge medications or the care you received while you were in the hospital after you are discharged, you can call the unit and asked to speak with the hospitalist on call if the hospitalist that took care of you is not available. Once you are discharged, your primary care physician will handle any further medical issues. Please note that NO REFILLS for any discharge medications will be authorized once you are discharged, as  it is imperative that you return to your primary care physician (or establish a relationship with a primary care physician if you do not have one) for your aftercare needs so that they can reassess your need for medications and monitor your lab values. Today   SUBJECTIVE   Out int he chair. Sitter + calm and pleasant drinking coffee and milk  VITAL SIGNS:  Blood pressure 123/83, pulse 94, temperature 98 F (36.7 C), temperature source Oral, resp. rate 20, height 5' (1.524 m), weight 45 kg, SpO2 96 %.  I/O:    Intake/Output Summary (Last 24 hours) at 11/22/2020 0941 Last data filed at 11/21/2020 2032 Gross per 24 hour  Intake 360 ml  Output --  Net 360 ml    PHYSICAL EXAMINATION:  GENERAL:  87 y.o.-year-old patient lying in the bed with no acute distress.  LUNGS: Normal breath sounds bilaterally, no wheezing, rales,rhonchi or crepitation. No use of accessory muscles of respiration.  CARDIOVASCULAR: S1, S2 normal. No murmurs, rubs,  or gallops.  ABDOMEN: Soft, non-tender, non-distended. Bowel sounds present. No organomegaly or mass.  EXTREMITIES: .chronic venous stasis changes+  NEUROLOGIC: grossly nonfocal PSYCHIATRIC: The patient is alert and awake SKIN: No obvious rash  DATA REVIEW:   CBC  Recent Labs  Lab 11/21/20 0451  WBC 7.3  HGB 12.6  HCT 37.7  PLT 270    Chemistries  Recent Labs  Lab 11/20/20 1854 11/21/20 0451  NA 139 139  K 3.7 4.3  CL 105 104  CO2 26 27  GLUCOSE 86 101*  BUN 38* 27*  CREATININE 0.88 0.77  CALCIUM 9.4 8.6*  AST 24  --   ALT 28  --   ALKPHOS 55  --   BILITOT 0.6  --     Microbiology Results   Recent Results (from the past 240 hour(s))  SARS CORONAVIRUS 2 (TAT 6-24 HRS) Nasopharyngeal Nasopharyngeal Swab     Status: None   Collection Time: 11/14/20  8:04 AM   Specimen: Nasopharyngeal Swab  Result Value Ref Range Status   SARS Coronavirus 2 NEGATIVE NEGATIVE Final    Comment: (NOTE) SARS-CoV-2 target nucleic acids are NOT DETECTED.  The SARS-CoV-2 RNA is generally detectable in upper and lower respiratory specimens during the acute phase of infection. Negative results do not preclude SARS-CoV-2 infection, do not rule out co-infections with other pathogens, and should not be used as the sole basis for treatment or other patient management decisions. Negative results must be combined with clinical observations, patient history, and epidemiological information. The expected result is Negative.  Fact Sheet for Patients: SugarRoll.be  Fact Sheet for Healthcare Providers: https://www.woods-mathews.com/  This test is not yet approved or cleared by the [place] FDA and  has been authorized for detection and/or diagnosis of SARS-CoV-2 by FDA under an Emergency Use Authorization (EUA). This EUA will remain  in effect (meaning this test can be used) for the duration of the COVID-19 declaration under Se ction 564(b)(1)  of the Act, 21 U.S.C. section 360bbb-3(b)(1), unless the authorization is terminated or revoked sooner.  Performed at [facility], [place] [address] [place], [place] [address]   SARS CORONAVIRUS 2 (TAT 6-24 HRS) Nasopharyngeal Nasopharyngeal Swab     Status: None   Collection Time: 11/20/20  6:54 PM   Specimen: Nasopharyngeal Swab  Result Value Ref Range Status   SARS Coronavirus 2 NEGATIVE NEGATIVE Final    Comment: (NOTE) SARS-CoV-2 target nucleic acids are NOT DETECTED.  The SARS-CoV-2 RNA is  generally detectable in upper and lower respiratory specimens during the acute phase of infection. Negative results do not preclude SARS-CoV-2 infection, do not rule out co-infections with other pathogens, and should not be used as the sole basis for treatment or other patient management decisions. Negative results must be combined with clinical observations, patient history, and epidemiological information. The expected result is Negative.  Fact Sheet for Patients: SugarRoll.be  Fact Sheet for Healthcare Providers: https://www.woods-mathews.com/  This test is not yet approved or cleared by the [place] FDA and  has been authorized for detection and/or diagnosis of SARS-CoV-2 by FDA under an Emergency Use Authorization (EUA). This EUA will remain  in effect (meaning this test can be used) for the duration of the COVID-19 declaration under Se ction 564(b)(1) of the Act, 21 U.S.C. section 360bbb-3(b)(1), unless the authorization is terminated or revoked sooner.  Performed at [facility], [place] [address], [place], [place] [address]     RADIOLOGY:  DG Tibia/Fibula Left  Result Date: 11/20/2020 CLINICAL DATA:  Fall with injury EXAM: LEFT TIBIA AND FIBULA - 2 VIEW COMPARISON:  Right ankle radiograph April 28, 2014 FINDINGS: There is no evidence of fracture or other focal bone lesions. Soft tissues are unremarkable.  Chondrocalcinosis with degenerative changes in the knee, suggestive of CPPD arthropathy. Vascular calcifications. IMPRESSION: 1. No acute osseous abnormality. Electronically Signed   By: Dahlia Bailiff MD   On: 11/20/2020 18:38   DG Tibia/Fibula Right  Result Date: 11/20/2020 CLINICAL DATA:  Fall with pain EXAM: RIGHT TIBIA AND FIBULA - 2 VIEW COMPARISON:  Right ankle radiograph April 28, 2014 FINDINGS: There is no evidence of fracture or other focal bone lesions. Soft tissues are unremarkable. Chondrocalcinosis with degenerative change of the knee as can be seen with CPPD arthropathy. Vascular calcifications. IMPRESSION: 1. No acute osseous abnormality. Electronically Signed   By: Dahlia Bailiff MD   On: 11/20/2020 18:39   CT Chest Wo Contrast  Result Date: 11/20/2020 CLINICAL DATA:  87 year old post fall out of chair while laughing on the porch with neighbor. Rib pain. EXAM: CT CHEST WITHOUT CONTRAST TECHNIQUE: Multidetector CT imaging of the chest was performed following the standard protocol without IV contrast. COMPARISON:  Chest radiograph 11/13/2020. Included portion from abdominopelvic CT 11/14/2020. FINDINGS: Cardiovascular: Aortic atherosclerosis and tortuosity. No periaortic stranding. Heart is upper normal in size. Coronary artery calcifications. No pericardial effusion. Mediastinum/Nodes: No evidence of mediastinal hemorrhage or hematoma. No gross adenopathy, partially motion obscured. Small hiatal hernia without esophageal wall thickening. Lungs/Pleura: Small right pleural effusion which is similar to abdominal CT last week. Adjacent compressive atelectasis. No pneumothorax. Mild chronic hyperinflation. Linear atelectasis or scarring in the left lower lobe. Trachea and central bronchi are grossly negative, partially obscured by breathing motion artifact. Tiny right upper lobe pulmonary nodule, series 3, image 29, stable from 05/24/2012 chest CT and considered benign. Upper Abdomen: No acute  or unexpected findings. Musculoskeletal: Thoracic spine assessed on dedicated thoracic spine CT, reported separately. Left shoulder arthroplasty. No acute displaced rib fracture. Motion artifact limits assessment for nondisplaced fractures. There is a remote fracture of the right posterior tenth and eleventh ribs. Degenerative change about the right shoulder. IMPRESSION: 1. No evidence of acute traumatic injury to the thorax, allowing for motion artifact. No displaced rib fracture 2. Small right pleural effusion and adjacent compressive atelectasis, similar to abdominal CT last week. 3. Aortic atherosclerosis.  Coronary artery calcifications. Aortic Atherosclerosis (ICD10-I70.0) . Electronically Signed   By: Aurther Loft.D.  On: 11/20/2020 18:23   CT T-SPINE NO CHARGE  Result Date: 11/20/2020 CLINICAL DATA:  Back and rib pain after a fall. EXAM: CT THORACIC SPINE WITHOUT CONTRAST TECHNIQUE: Multidetector CT images of the thoracic were obtained using the standard protocol without intravenous contrast. COMPARISON:  Chest CT 05/24/2012.  Chest radiographs 09/15/2016. FINDINGS: Alignment: Mild S-shaped thoracolumbar scoliosis. Grade 1 anterolisthesis of C5 on C6, C6 on C7, and C7 on T1, likely degenerative and facet mediated. Increased thoracic kyphosis. Vertebrae: T8 superior endplate compression fracture with 20% vertebral body height loss, new from 2018 and suspected to be recent as there is the suggestion of a visible fracture line as well as mild paravertebral soft tissue hematoma. No retropulsion or posterior element fracture. Diffuse osteopenia. Paraspinal and other soft tissues: Small paravertebral hematoma at T8. Intrathoracic contents reported separately. Disc levels: Advanced cervical disc degeneration at C5-6 and C6-7. Moderate to severe facet arthrosis in the lower cervical and upper thoracic spine. Asymmetrically advanced right-sided disc degeneration at L1-2 and L2-3 with resultant right neural  foraminal stenosis. No evidence of high-grade thoracic spinal canal stenosis. IMPRESSION: Mild T8 compression fracture, likely acute. Electronically Signed   By: Logan Bores M.D.   On: 11/20/2020 18:22     CODE STATUS:     Code Status Orders  (From admission, onward)         Start     Ordered   11/20/20 1914  Do not attempt resuscitation (DNR)  Continuous       Question Answer Comment  In the event of cardiac or respiratory ARREST Do not call a "code blue"   In the event of cardiac or respiratory ARREST Do not perform Intubation, CPR, defibrillation or ACLS   In the event of cardiac or respiratory ARREST Use medication by any route, position, wound care, and other measures to relive pain and suffering. May use oxygen, suction and manual treatment of airway obstruction as needed for comfort.   Comments Patient has a universal DNR      11/20/20 1913        Code Status History    Date Active Date Inactive Code Status Order ID Comments User Context   11/20/2020 1913 11/20/2020 1913 Full Code [id number]  Orene Desanctis, DO ED   11/14/2020 1201 11/17/2020 1912 DNR [id number]  Collier Bullock, MD ED   11/14/2020 0925 11/14/2020 1201 DNR [id number]  Lucrezia Starch, MD ED   03/16/2017 1527 03/17/2017 2011 Full Code [id number]  Dustin Flock, MD Inpatient   Advance Care Planning Activity    Advance Directive Documentation   Flowsheet Row Most Recent Value  Type of Advance Directive Healthcare Power of Attorney  Pre-existing out of facility DNR order (yellow form or pink MOST form) --  "MOST" Form in Place? --       TOTAL TIME TAKING CARE OF THIS PATIENT: *40* minutes.    Fritzi Mandes M.D  [facility]    CC: Primary care physician; Einar Pheasant, MD

## 2020-11-22 NOTE — TOC Transition Note (Signed)
Transition of Care [facility]) - CM/SW Discharge Note   Patient Details  Name: Christy Cisneros MRN: [id number] Date of Birth: 1919/11/18  Transition of Care [facility]) CM/SW Contact:  Boris Sharper, LCSW Phone Number: 11/22/2020, 10:41 AM   Clinical Narrative:    Pt is medically stable for discharge per MD. Pt will be transported home by family friend Pamala Hurry. CSW spoke with Malachy Mood to  Arrange [facility] PT with [facility]. Pt already has necessary DME.     Final next level of care: Home w Home Health Services Barriers to Discharge: No Barriers Identified   Patient Goals and CMS Choice        Discharge Placement                  Name of family member notified: Pamala Hurry- friend Patient and family notified of of transfer: 11/22/20  Discharge Plan and Services                          HH Arranged: PT [facility] Agency: [place] Date [place]: 11/22/20 Time [place]: 1007 Representative spoke with at [place]: [place] [facility]) Interventions     Readmission Risk Interventions No flowsheet data found.

## 2020-11-22 NOTE — Plan of Care (Signed)
  Problem: Nutrition: Goal: Adequate nutrition will be maintained Outcome: Not Progressing   

## 2020-11-23 ENCOUNTER — Telehealth: Payer: Self-pay

## 2020-11-23 NOTE — Telephone Encounter (Signed)
Transition Care Management Follow-up Telephone Call  Date of discharge and from where: 11/17/20 from [facility]. Discharge for second fall 11/22/20 from [facility].  How have you been since you were released from the hospital?  Daughter notes patient is alert and oriented. At baseline. No falls since returning home.   Any questions or concerns? No  Items Reviewed:  Did the pt receive and understand the discharge instructions provided? Yes   Medications obtained and verified? Yes   Any new allergies since your discharge? No    Dietary orders reviewed? Yes  Do you have support at home? Yes   Home Care and Equipment/Supplies: Were home health services ordered? Private Care  Functional Questionnaire: (I = Independent and D = Dependent) ADLs: D  Bathing/Dressing- D  Meal Prep- D  Eating- I  Maintaining continence- I  Transferring/Ambulation- Cane in use  Follow up appointments reviewed:   PCP Hospital f/u appt confirmed? Yes  To schedule with Dr. Nicki Reaper. Confirmation to follow.   Are transportation arrangements needed? No   If their condition worsens, is the pt aware to call PCP or go to the Emergency Dept.? Yes  Was the patient provided with contact information for the PCP's office or ED? Yes  Was to pt encouraged to call back with questions or concerns? Yes

## 2020-11-23 NOTE — Telephone Encounter (Signed)
Patient given TB the morning of 11/22/20. Okay to be read 48-72 hours. Nurse visit scheduled 11/24/20 to result. Patient will be accompanied by cousin Eddie Dibbles [phone]. Please call and confirm HFU day/time and if nurse visit will be rescheduled next day.

## 2020-11-24 ENCOUNTER — Ambulatory Visit (INDEPENDENT_AMBULATORY_CARE_PROVIDER_SITE_OTHER): Payer: Medicare Other

## 2020-11-24 ENCOUNTER — Other Ambulatory Visit: Payer: Self-pay

## 2020-11-24 DIAGNOSIS — Z111 Encounter for screening for respiratory tuberculosis: Secondary | ICD-10-CM

## 2020-11-24 DIAGNOSIS — R296 Repeated falls: Secondary | ICD-10-CM

## 2020-11-24 LAB — READ PPD: TB Skin Test: NEGATIVE

## 2020-11-24 NOTE — Telephone Encounter (Signed)
Per [name] had wanted to bring Christy Cisneros in for appt and to have TB skin test read on same day.  See if they can come in on Wednesday instead of Tuesday.  Can read TB skin test on Wednesday if placed on Sunday (72 hours).

## 2020-11-24 NOTE — Telephone Encounter (Signed)
LM to call back and schedule with Dr Nicki Reaper.

## 2020-11-24 NOTE — Progress Notes (Signed)
Patient presented for PPD read to her Left arm. Results where negative/0MM.

## 2020-11-24 NOTE — Telephone Encounter (Signed)
See other phone note

## 2020-11-24 NOTE — Telephone Encounter (Signed)
Left message for Christy Cisneros to return call. Need to cancel NV for today. Schedule appt for tomorrow- end of half day. Can read TB skin test during appt with PCP

## 2020-11-24 NOTE — Telephone Encounter (Signed)
Christy Cisneros called returning your call

## 2020-11-24 NOTE — Telephone Encounter (Signed)
Per Caryl Pina, pt has been scheduled with Dr Nicki Reaper

## 2020-11-24 NOTE — Telephone Encounter (Signed)
LM for Christy Cisneros to return my call

## 2020-11-25 ENCOUNTER — Telehealth: Payer: Self-pay | Admitting: *Deleted

## 2020-11-25 ENCOUNTER — Ambulatory Visit (INDEPENDENT_AMBULATORY_CARE_PROVIDER_SITE_OTHER): Payer: Medicare Other | Admitting: Internal Medicine

## 2020-11-25 DIAGNOSIS — S81819D Laceration without foreign body, unspecified lower leg, subsequent encounter: Secondary | ICD-10-CM | POA: Diagnosis not present

## 2020-11-25 DIAGNOSIS — K2270 Barrett's esophagus without dysplasia: Secondary | ICD-10-CM | POA: Diagnosis not present

## 2020-11-25 DIAGNOSIS — E7800 Pure hypercholesterolemia, unspecified: Secondary | ICD-10-CM | POA: Diagnosis not present

## 2020-11-25 DIAGNOSIS — H409 Unspecified glaucoma: Secondary | ICD-10-CM | POA: Diagnosis not present

## 2020-11-25 DIAGNOSIS — M8008XD Age-related osteoporosis with current pathological fracture, vertebra(e), subsequent encounter for fracture with routine healing: Secondary | ICD-10-CM | POA: Diagnosis not present

## 2020-11-25 DIAGNOSIS — K2100 Gastro-esophageal reflux disease with esophagitis, without bleeding: Secondary | ICD-10-CM

## 2020-11-25 DIAGNOSIS — R4189 Other symptoms and signs involving cognitive functions and awareness: Secondary | ICD-10-CM | POA: Diagnosis not present

## 2020-11-25 DIAGNOSIS — W19XXXD Unspecified fall, subsequent encounter: Secondary | ICD-10-CM | POA: Diagnosis not present

## 2020-11-25 DIAGNOSIS — M1990 Unspecified osteoarthritis, unspecified site: Secondary | ICD-10-CM | POA: Diagnosis not present

## 2020-11-25 DIAGNOSIS — M79674 Pain in right toe(s): Secondary | ICD-10-CM

## 2020-11-25 DIAGNOSIS — F0390 Unspecified dementia without behavioral disturbance: Secondary | ICD-10-CM | POA: Diagnosis not present

## 2020-11-25 DIAGNOSIS — Z8673 Personal history of transient ischemic attack (TIA), and cerebral infarction without residual deficits: Secondary | ICD-10-CM | POA: Diagnosis not present

## 2020-11-25 DIAGNOSIS — R531 Weakness: Secondary | ICD-10-CM | POA: Diagnosis not present

## 2020-11-25 DIAGNOSIS — F32A Depression, unspecified: Secondary | ICD-10-CM | POA: Diagnosis not present

## 2020-11-25 DIAGNOSIS — E785 Hyperlipidemia, unspecified: Secondary | ICD-10-CM | POA: Diagnosis not present

## 2020-11-25 DIAGNOSIS — S22060D Wedge compression fracture of T7-T8 vertebra, subsequent encounter for fracture with routine healing: Secondary | ICD-10-CM

## 2020-11-25 DIAGNOSIS — K219 Gastro-esophageal reflux disease without esophagitis: Secondary | ICD-10-CM | POA: Diagnosis not present

## 2020-11-25 DIAGNOSIS — G4730 Sleep apnea, unspecified: Secondary | ICD-10-CM | POA: Diagnosis not present

## 2020-11-25 NOTE — Progress Notes (Addendum)
Patient ID: Christy Cisneros, female   DOB: 1920-06-20, 87 y.o.   MRN: [id number]   Subjective:    Patient ID: Christy Cisneros, female    DOB: 1920/01/25, 87 y.o.   MRN: [id number]  HPI This visit occurred during the SARS-CoV-2 public health emergency.  Safety protocols were in place, including screening questions prior to the visit, additional usage of staff PPE, and extensive cleaning of exam room while observing appropriate contact time as indicated for disinfecting solutions.  Patient here for hospital follow up. Admitted 11/20/20 - 11/22/20 - after falling and suffering T8 compression fracture.  Was given pain medication in hospital.  After family discussion, it was decided for her to return home with sitters.  Discussed assisted living.  She has been resistant to this in the past.  Her cousin Christy Cisneros accompanies her today. History obtained from both of them.  She is having some increased back pain.  Not taking any pain medication scheduled.  Eating.  No nausea or vomiting.  Bowels moving.  PT planning to come to her home for home physical therapy.  Discussed with her today regarding the transition to assisted living and that I felt this was best for her safety.  Discussed with Christy Cisneros, that if she remained home, the recommendation was for 24 hour care.  Discussed palliative care.  He is planning take her tomorrow to look at [facility].  Questions answered about the facility.    Past Medical History:  Diagnosis Date  . Barrett's esophagus with esophagitis   . Chronic bronchitis ([place])   . Depression   . GERD (gastroesophageal reflux disease)   . Glaucoma   . Hypercholesteremia   . Osteoarthritis   . Osteoporosis   . Sleep apnea    has CPAP  . TIA (transient ischemic attack)    Past Surgical History:  Procedure Laterality Date  . BUNIONECTOMY     left  . ROTATOR CUFF REPAIR     right   Family History  Problem Relation Age of Onset  . Breast cancer Sister   . Heart  disease Mother   . Alcohol abuse Father    Social History   Socioeconomic History  . Marital status: Single    Spouse name: Not on file  . Number of children: Not on file  . Years of education: Not on file  . Highest education level: Not on file  Occupational History  . Not on file  Tobacco Use  . Smoking status: Never Smoker  . Smokeless tobacco: Never Used  Vaping Use  . Vaping Use: Never used  Substance and Sexual Activity  . Alcohol use: Yes    Alcohol/week: 0.0 standard drinks    Comment: wine/brandy occasional  . Drug use: No  . Sexual activity: Never  Other Topics Concern  . Not on file  Social History Narrative   Lives alone no kids   Social Determinants of Health   Financial Resource Strain: Not on file  Food Insecurity: Not on file  Transportation Needs: Not on file  Physical Activity: Not on file  Stress: Not on file  Social Connections: Not on file    Outpatient Encounter Medications as of 11/25/2020  Medication Sig  . omeprazole (PRILOSEC) 20 MG capsule TAKE ONE CAPSULE TWICE A DAY BEFORE MEALS  . tobramycin (TOBREX) 0.3 % ophthalmic solution   . [DISCONTINUED] acetaminophen (TYLENOL) 325 MG tablet Take 2 tablets (650 mg total) by mouth every 8 (eight) hours as needed for  mild pain.   No facility-administered encounter medications on file as of 11/25/2020.    Review of Systems  Constitutional: Negative for appetite change and unexpected weight change.  HENT: Negative for congestion and sinus pressure.   Respiratory: Negative for cough, chest tightness and shortness of breath.   Cardiovascular: Negative for chest pain, palpitations and leg swelling.  Gastrointestinal: Negative for abdominal pain, diarrhea, nausea and vomiting.  Genitourinary: Negative for difficulty urinating and dysuria.  Musculoskeletal: Positive for back pain. Negative for joint swelling and myalgias.  Skin: Negative for color change and rash.  Neurological: Negative for  dizziness, light-headedness and headaches.  Psychiatric/Behavioral: Negative for agitation and dysphoric mood.       Objective:    Physical Exam Vitals reviewed.  Constitutional:      General: She is not in acute distress.    Appearance: Normal appearance.  HENT:     Head: Normocephalic and atraumatic.     Right Ear: External ear normal.     Left Ear: External ear normal.  Eyes:     General: No scleral icterus.       Right eye: No discharge.        Left eye: No discharge.     Conjunctiva/sclera: Conjunctivae normal.  Neck:     Thyroid: No thyromegaly.  Cardiovascular:     Rate and Rhythm: Normal rate and regular rhythm.  Pulmonary:     Effort: No respiratory distress.     Breath sounds: Normal breath sounds. No wheezing.  Abdominal:     General: Bowel sounds are normal.     Palpations: Abdomen is soft.     Tenderness: There is no abdominal tenderness.  Musculoskeletal:        General: No swelling or tenderness.     Cervical back: Neck supple. No tenderness.  Lymphadenopathy:     Cervical: No cervical adenopathy.  Skin:    Findings: No erythema or rash.     Comments: Healing open laceration - lower leg.  No surrounding erythema.  Toe - wound - improved.    Neurological:     Mental Status: She is alert.  Psychiatric:        Mood and Affect: Mood normal.        Behavior: Behavior normal.     BP 114/70   Pulse 84   Temp 98 F (36.7 C) (Temporal)   Resp 16   Ht 5' (1.524 m)   Wt 100 lb (45.4 kg)   SpO2 98%   BMI 19.53 kg/m  Wt Readings from Last 3 Encounters:  11/25/20 100 lb (45.4 kg)  11/20/20 99 lb 3.2 oz (45 kg)  11/13/20 100 lb (45.4 kg)     Lab Results  Component Value Date   WBC 7.3 11/21/2020   HGB 12.6 11/21/2020   HCT 37.7 11/21/2020   PLT 270 11/21/2020   GLUCOSE 101 (H) 11/21/2020   CHOL 243 (H) 03/21/2019   TRIG 100.0 03/21/2019   HDL 81.40 03/21/2019   LDLCALC 142 (H) 03/21/2019   ALT 28 11/20/2020   AST 24 11/20/2020   NA 139  11/21/2020   K 4.3 11/21/2020   CL 104 11/21/2020   CREATININE 0.77 11/21/2020   BUN 27 (H) 11/21/2020   CO2 27 11/21/2020   TSH 3.275 11/13/2020   INR 0.96 03/16/2017   HGBA1C 5.4 03/17/2017    DG Tibia/Fibula Left  Result Date: 11/20/2020 CLINICAL DATA:  Fall with injury EXAM: LEFT TIBIA AND FIBULA - 2 VIEW  COMPARISON:  Right ankle radiograph April 28, 2014 FINDINGS: There is no evidence of fracture or other focal bone lesions. Soft tissues are unremarkable. Chondrocalcinosis with degenerative changes in the knee, suggestive of CPPD arthropathy. Vascular calcifications. IMPRESSION: 1. No acute osseous abnormality. Electronically Signed   By: Dahlia Bailiff MD   On: 11/20/2020 18:38   DG Tibia/Fibula Right  Result Date: 11/20/2020 CLINICAL DATA:  Fall with pain EXAM: RIGHT TIBIA AND FIBULA - 2 VIEW COMPARISON:  Right ankle radiograph April 28, 2014 FINDINGS: There is no evidence of fracture or other focal bone lesions. Soft tissues are unremarkable. Chondrocalcinosis with degenerative change of the knee as can be seen with CPPD arthropathy. Vascular calcifications. IMPRESSION: 1. No acute osseous abnormality. Electronically Signed   By: Dahlia Bailiff MD   On: 11/20/2020 18:39   CT Chest Wo Contrast  Result Date: 11/20/2020 CLINICAL DATA:  87 year old post fall out of chair while laughing on the porch with neighbor. Rib pain. EXAM: CT CHEST WITHOUT CONTRAST TECHNIQUE: Multidetector CT imaging of the chest was performed following the standard protocol without IV contrast. COMPARISON:  Chest radiograph 11/13/2020. Included portion from abdominopelvic CT 11/14/2020. FINDINGS: Cardiovascular: Aortic atherosclerosis and tortuosity. No periaortic stranding. Heart is upper normal in size. Coronary artery calcifications. No pericardial effusion. Mediastinum/Nodes: No evidence of mediastinal hemorrhage or hematoma. No gross adenopathy, partially motion obscured. Small hiatal hernia without  esophageal wall thickening. Lungs/Pleura: Small right pleural effusion which is similar to abdominal CT last week. Adjacent compressive atelectasis. No pneumothorax. Mild chronic hyperinflation. Linear atelectasis or scarring in the left lower lobe. Trachea and central bronchi are grossly negative, partially obscured by breathing motion artifact. Tiny right upper lobe pulmonary nodule, series 3, image 29, stable from 05/24/2012 chest CT and considered benign. Upper Abdomen: No acute or unexpected findings. Musculoskeletal: Thoracic spine assessed on dedicated thoracic spine CT, reported separately. Left shoulder arthroplasty. No acute displaced rib fracture. Motion artifact limits assessment for nondisplaced fractures. There is a remote fracture of the right posterior tenth and eleventh ribs. Degenerative change about the right shoulder. IMPRESSION: 1. No evidence of acute traumatic injury to the thorax, allowing for motion artifact. No displaced rib fracture 2. Small right pleural effusion and adjacent compressive atelectasis, similar to abdominal CT last week. 3. Aortic atherosclerosis.  Coronary artery calcifications. Aortic Atherosclerosis (ICD10-I70.0) . Electronically Signed   By: Keith Rake M.D.   On: 11/20/2020 18:23   CT T-SPINE NO CHARGE  Result Date: 11/20/2020 CLINICAL DATA:  Back and rib pain after a fall. EXAM: CT THORACIC SPINE WITHOUT CONTRAST TECHNIQUE: Multidetector CT images of the thoracic were obtained using the standard protocol without intravenous contrast. COMPARISON:  Chest CT 05/24/2012.  Chest radiographs 09/15/2016. FINDINGS: Alignment: Mild S-shaped thoracolumbar scoliosis. Grade 1 anterolisthesis of C5 on C6, C6 on C7, and C7 on T1, likely degenerative and facet mediated. Increased thoracic kyphosis. Vertebrae: T8 superior endplate compression fracture with 20% vertebral body height loss, new from 2018 and suspected to be recent as there is the suggestion of a visible fracture  line as well as mild paravertebral soft tissue hematoma. No retropulsion or posterior element fracture. Diffuse osteopenia. Paraspinal and other soft tissues: Small paravertebral hematoma at T8. Intrathoracic contents reported separately. Disc levels: Advanced cervical disc degeneration at C5-6 and C6-7. Moderate to severe facet arthrosis in the lower cervical and upper thoracic spine. Asymmetrically advanced right-sided disc degeneration at L1-2 and L2-3 with resultant right neural foraminal stenosis. No evidence of high-grade thoracic spinal canal  stenosis. IMPRESSION: Mild T8 compression fracture, likely acute. Electronically Signed   By: Logan Bores M.D.   On: 11/20/2020 18:22       Assessment & Plan:   Problem List Items Addressed This Visit    Cognitive impairment    Was previously evaluated by neurology.  Diagnosed with cognitive impairment.  Discussed assisted living with her today.  Discussed recommendation for transition to assisted living.  Questions answered.  They are planning to visit [name] tomorrow and will let me know of her decision.        Compression fracture of T8 vertebra (HCC)    Recent fall.  Compression fracture T8.  Not taking any scheduled pain medication.  Discussed tylenol ES - can take two tablets bid and add midday if needed.  Follow.  Consider addition of miacalcin.   Has declined other medication previously.       Fall    Recent fall.  Planning for home PT.  Discussed assisted living and recommendation for transition to assisted living facility.  If remains at home, discussed palliative care.  Christy Cisneros plans to discuss with family.  Notify me of decision.  FL2 has been completed.       GERD (gastroesophageal reflux disease)    No acid reflux reported.  Eating.        HYPERCHOLESTEROLEMIA    Follow lipid panel.       Leg laceration    Lower leg laceration.  Healing.  No evidence of infection.  Continue telfa dressing.  Follow.       Toe pain    Toe -  much improved. Continue telfa - dressing.  Healing.  Decreased swelling.  Follow.       Weakness    Recent fall.  Home PT as outlined.            Einar Pheasant, MD

## 2020-11-25 NOTE — Chronic Care Management (AMB) (Signed)
  Chronic Care Management   Note  11/25/2020 Name: HATTYE SIEGFRIED MRN: [id number] DOB: [birth year]  MAELEY MATTON is a 87 y.o. year old female who is a primary care patient of Einar Pheasant, MD. I reached out to [name] by phone today in response to a referral sent by Ms. Maida Sale Weissman's PCP, Einar Pheasant, MD.     Ms. Nudelman was given information about Chronic Care Management services today including:  1. CCM service includes personalized support from designated clinical staff supervised by her physician, including individualized plan of care and coordination with other care providers 2. 24/7 contact phone numbers for assistance for urgent and routine care needs. 3. Service will only be billed when office clinical staff spend 20 minutes or more in a month to coordinate care. 4. Only one practitioner may furnish and bill the service in a calendar month. 5. The patient may stop CCM services at any time (effective at the end of the month) by phone call to the office staff. 6. The patient will be responsible for cost sharing (co-pay) of up to 20% of the service fee (after annual deductible is met).  Niece POA Wade, TERI verbally agreed to assistance and services provided by embedded care coordination/care management team today.  Follow up plan: Telephone appointment with care management team member scheduled for:12/07/2020  [facility]

## 2020-11-25 NOTE — Patient Instructions (Signed)
Tylenol extra strength 500mg  - take 2 tablets twice day for the next 1-2 weeks.

## 2020-11-29 ENCOUNTER — Encounter: Payer: Self-pay | Admitting: Internal Medicine

## 2020-11-29 DIAGNOSIS — S81819A Laceration without foreign body, unspecified lower leg, initial encounter: Secondary | ICD-10-CM | POA: Insufficient documentation

## 2020-11-29 NOTE — Assessment & Plan Note (Signed)
Recent fall.  Planning for home PT.  Discussed assisted living and recommendation for transition to assisted living facility.  If remains at home, discussed palliative care.  Christy Cisneros plans to discuss with family.  Notify me of decision.  FL2 has been completed.

## 2020-11-29 NOTE — Assessment & Plan Note (Signed)
Toe - much improved. Continue telfa - dressing.  Healing.  Decreased swelling.  Follow.

## 2020-11-29 NOTE — Assessment & Plan Note (Signed)
No acid reflux reported.  Eating.

## 2020-11-29 NOTE — Assessment & Plan Note (Signed)
Follow lipid panel.   

## 2020-11-29 NOTE — Assessment & Plan Note (Signed)
Recent fall.  Compression fracture T8.  Not taking any scheduled pain medication.  Discussed tylenol ES - can take two tablets bid and add midday if needed.  Follow.  Consider addition of miacalcin.   Has declined other medication previously.

## 2020-11-29 NOTE — Assessment & Plan Note (Signed)
Lower leg laceration.  Healing.  No evidence of infection.  Continue telfa dressing.  Follow.

## 2020-11-29 NOTE — Assessment & Plan Note (Signed)
Recent fall.  Home PT as outlined.

## 2020-11-29 NOTE — Assessment & Plan Note (Signed)
Was previously evaluated by neurology.  Diagnosed with cognitive impairment.  Discussed assisted living with her today.  Discussed recommendation for transition to assisted living.  Questions answered.  They are planning to visit [name] tomorrow and will let me know of her decision.

## 2020-11-30 DIAGNOSIS — K2270 Barrett's esophagus without dysplasia: Secondary | ICD-10-CM | POA: Diagnosis not present

## 2020-11-30 DIAGNOSIS — K219 Gastro-esophageal reflux disease without esophagitis: Secondary | ICD-10-CM | POA: Diagnosis not present

## 2020-11-30 DIAGNOSIS — M8008XD Age-related osteoporosis with current pathological fracture, vertebra(e), subsequent encounter for fracture with routine healing: Secondary | ICD-10-CM | POA: Diagnosis not present

## 2020-11-30 DIAGNOSIS — W19XXXD Unspecified fall, subsequent encounter: Secondary | ICD-10-CM | POA: Diagnosis not present

## 2020-11-30 DIAGNOSIS — S22060D Wedge compression fracture of T7-T8 vertebra, subsequent encounter for fracture with routine healing: Secondary | ICD-10-CM | POA: Diagnosis not present

## 2020-11-30 DIAGNOSIS — F0390 Unspecified dementia without behavioral disturbance: Secondary | ICD-10-CM | POA: Diagnosis not present

## 2020-12-02 DIAGNOSIS — K2270 Barrett's esophagus without dysplasia: Secondary | ICD-10-CM | POA: Diagnosis not present

## 2020-12-02 DIAGNOSIS — F0390 Unspecified dementia without behavioral disturbance: Secondary | ICD-10-CM | POA: Diagnosis not present

## 2020-12-02 DIAGNOSIS — W19XXXD Unspecified fall, subsequent encounter: Secondary | ICD-10-CM | POA: Diagnosis not present

## 2020-12-02 DIAGNOSIS — S22060D Wedge compression fracture of T7-T8 vertebra, subsequent encounter for fracture with routine healing: Secondary | ICD-10-CM | POA: Diagnosis not present

## 2020-12-02 DIAGNOSIS — K219 Gastro-esophageal reflux disease without esophagitis: Secondary | ICD-10-CM | POA: Diagnosis not present

## 2020-12-02 DIAGNOSIS — M8008XD Age-related osteoporosis with current pathological fracture, vertebra(e), subsequent encounter for fracture with routine healing: Secondary | ICD-10-CM | POA: Diagnosis not present

## 2020-12-03 DIAGNOSIS — H401433 Capsular glaucoma with pseudoexfoliation of lens, bilateral, severe stage: Secondary | ICD-10-CM | POA: Diagnosis not present

## 2020-12-03 DIAGNOSIS — H1820 Unspecified corneal edema: Secondary | ICD-10-CM | POA: Diagnosis not present

## 2020-12-03 DIAGNOSIS — H353132 Nonexudative age-related macular degeneration, bilateral, intermediate dry stage: Secondary | ICD-10-CM | POA: Diagnosis not present

## 2020-12-03 DIAGNOSIS — H10232 Serous conjunctivitis, except viral, left eye: Secondary | ICD-10-CM | POA: Diagnosis not present

## 2020-12-03 DIAGNOSIS — H18232 Secondary corneal edema, left eye: Secondary | ICD-10-CM | POA: Diagnosis not present

## 2020-12-07 ENCOUNTER — Ambulatory Visit (INDEPENDENT_AMBULATORY_CARE_PROVIDER_SITE_OTHER): Payer: Medicare Other | Admitting: *Deleted

## 2020-12-07 DIAGNOSIS — S22060D Wedge compression fracture of T7-T8 vertebra, subsequent encounter for fracture with routine healing: Secondary | ICD-10-CM | POA: Diagnosis not present

## 2020-12-07 DIAGNOSIS — G3184 Mild cognitive impairment, so stated: Secondary | ICD-10-CM

## 2020-12-07 DIAGNOSIS — R4189 Other symptoms and signs involving cognitive functions and awareness: Secondary | ICD-10-CM

## 2020-12-07 DIAGNOSIS — R413 Other amnesia: Secondary | ICD-10-CM

## 2020-12-07 DIAGNOSIS — M810 Age-related osteoporosis without current pathological fracture: Secondary | ICD-10-CM | POA: Diagnosis not present

## 2020-12-07 DIAGNOSIS — K219 Gastro-esophageal reflux disease without esophagitis: Secondary | ICD-10-CM | POA: Diagnosis not present

## 2020-12-07 DIAGNOSIS — K2270 Barrett's esophagus without dysplasia: Secondary | ICD-10-CM | POA: Diagnosis not present

## 2020-12-07 DIAGNOSIS — W19XXXD Unspecified fall, subsequent encounter: Secondary | ICD-10-CM | POA: Diagnosis not present

## 2020-12-07 DIAGNOSIS — F0390 Unspecified dementia without behavioral disturbance: Secondary | ICD-10-CM | POA: Diagnosis not present

## 2020-12-07 DIAGNOSIS — M8008XD Age-related osteoporosis with current pathological fracture, vertebra(e), subsequent encounter for fracture with routine healing: Secondary | ICD-10-CM | POA: Diagnosis not present

## 2020-12-07 DIAGNOSIS — G459 Transient cerebral ischemic attack, unspecified: Secondary | ICD-10-CM

## 2020-12-07 DIAGNOSIS — R42 Dizziness and giddiness: Secondary | ICD-10-CM

## 2020-12-07 DIAGNOSIS — R531 Weakness: Secondary | ICD-10-CM

## 2020-12-07 DIAGNOSIS — R2681 Unsteadiness on feet: Secondary | ICD-10-CM

## 2020-12-07 NOTE — Progress Notes (Signed)
Don't we have a completed FL2 form on this pt?

## 2020-12-08 NOTE — Chronic Care Management (AMB) (Signed)
Chronic Care Management    Clinical Social Work Note  12/08/2020 Name: Christy Cisneros MRN: [id number] DOB: [birth year]  Christy Cisneros is a 87 y.o. year old female who is a primary care patient of Einar Pheasant, MD. The CCM team was consulted to assist the patient with chronic disease management and/or care coordination needs related to: Level of Care Concerns in Patient with Osteoporosis, Osteoarthritis, Weakness, Unsteady Gait, Cognitive Impairment, History of Falls/Risks, Dizziness.   Engaged with patient and Niece/Healthcare Power of [place], [name] by telephone for initial visit in response to provider referral for social work chronic care management and care coordination services.   Consent to Services:  The patient was given information about Chronic Care Management services, agreed to services, and gave verbal consent prior to initiation of services.  Please see initial visit note for detailed documentation.   Patient agreed to services and consent obtained.   Assessment: Review of patient past medical history, allergies, medications, and health status, including review of relevant consultants reports was performed today as part of a comprehensive evaluation and provision of chronic care management and care coordination services.     SDOH (Social Determinants of Health) assessments and interventions performed:  SDOH Interventions   Flowsheet Row Most Recent Value  SDOH Interventions   Food Insecurity Interventions Intervention Not Indicated  [According to Healthcare Power of Attorney.]  Financial Strain Interventions Intervention Not Indicated  [According to Healthcare Power of Attorney.]  Housing Interventions Intervention Not Indicated  [According to Healthcare Power of Attorney.]  Intimate Partner Violence Interventions Intervention Not Indicated  [According to Healthcare Power of Attorney.]  Physical Activity Interventions Intervention Not Indicated  [According  to Healthcare Power of Attorney.]  Stress Interventions Intervention Not Indicated  [According to Healthcare Power of Attorney.]  Social Connections Interventions Intervention Not Indicated  [According to Healthcare Power of Attorney.]  Transportation Interventions Intervention Not Indicated  [According to Healthcare Power of Attorney.]       Advanced Directives Status: Advanced Directives (Living Will and Healthcare Power of Attorney Documents) in place.  CCM Care Plan  Allergies  Allergen Reactions  . Bacitracin Other (See Comments)    Tearing, itching, swelling, redness Other Reaction: tearing, swollen red eyes Tearing, itching, swelling, redness  . Cephalexin Other (See Comments)    HA, nausea, gas, abdominal pain Other reaction(s): Abdominal Pain, Other (See Comments) Other Reaction: N&V HA, nausea, gas, abdominal pain  . Codeine Other (See Comments)    HA, nausea, gas, abdominal pain Other reaction(s): Abdominal Pain Other Reaction: N&V HA, nausea, gas, abdominal pain  . Avelox [Moxifloxacin Hcl In Nacl] Other (See Comments)    Dizziness, weakness  . Cefdinir Nausea And Vomiting  . Clarithromycin     HA, nausea, gas, abdominal pain  . Hydrocodone-Acetaminophen     REACTION: chest pain, constipation, nausea  . Lansoprazole     REACTION: cramps, nausea, diarrhea  . Polymyxin B     Itching and redness  . Vigamox [Moxifloxacin] Itching    Soreness, irritability, loss of appetite,  Dizziness, diarrhea, muscle weakness  . Brimonidine Itching    Burning, stinging, bloody eye, dry mouth Burning, stinging, bloody eye, dry mouth Burning, stinging, bloody eye, dry mouth  . Erythromycin Rash    HA, nausea, gas, abdominal pain HA, nausea, gas, abdominal pain    Outpatient Encounter Medications as of 12/07/2020  Medication Sig  . omeprazole (PRILOSEC) 20 MG capsule TAKE ONE CAPSULE TWICE A DAY BEFORE MEALS  . tobramycin (TOBREX) 0.3 %  ophthalmic solution    No  facility-administered encounter medications on file as of 12/07/2020.    Patient Active Problem List   Diagnosis Date Noted  . Leg laceration 11/29/2020  . Compression fracture of T8 vertebra ([place]) 11/20/2020  . Blindness 11/20/2020  . Fall 11/14/2020  . Cognitive impairment 11/14/2020  . Encounter for completion of form with patient 10/10/2020  . Toe pain 06/07/2020  . Weight loss 03/25/2019  . Pain due to onychomycosis of toenails of both feet 03/11/2019  . Depression 05/06/2018  . Abrasion 02/28/2018  . History of CVA (cerebrovascular accident) 03/16/2017  . Memory change 09/04/2016  . Pneumonia 07/17/2016  . Mild cognitive impairment 05/15/2016  . Dizziness 04/21/2016  . Neck pain 02/11/2016  . Muscle cramps 11/14/2015  . Urine incontinence 08/16/2015  . Hematoma 08/16/2015  . Weakness 07/12/2015  . Headache 06/16/2015  . Unsteady gait 06/01/2015  . GERD (gastroesophageal reflux disease) 08/11/2014  . DYSPNEA 04/17/2009  . HYPERCHOLESTEROLEMIA 04/16/2009  . Obstructive sleep apnea 04/16/2009  . Glaucoma 04/16/2009  . Transient cerebral ischemia 04/16/2009  . BARRETTS ESOPHAGUS 04/16/2009  . OSTEOARTHRITIS 04/16/2009  . Osteoporosis 04/16/2009    Conditions to be addressed/monitored: Level of Care Concerns in Patient with Osteoporosis, Osteoarthritis, Weakness, Unsteady Gait, Cognitive Impairment, History of Falls/Risks, Dizziness.  Limited Social Support, Level of Care Concerns, ADL/IADL Limitations, Cognitive Deficits and Memory Deficits.  Care Plan : LCSW Plan of Care  Updates made by Francis Gaines, LCSW since 12/08/2020 12:00 AM    Problem: Planning for Long-Term Care - Dementia.   Priority: High    Goal: Planning for Long-Term Care - Dementia.   Start Date: 12/07/2020  Expected End Date: 01/07/2021  This Visit's Progress: On track  Priority: High  Note:   Current Barriers:    Patient with Osteoporosis, Osteoarthritis, Compression Fracture of T-8 Vertebra,  Weakness, Unsteady Gait, Cognitive Impairment, Dizziness, Blindness, Deafness and History of Falls, would benefit from 24 hour care and supervision in a long-term memory care assisted living facility.  Patient's health has declined and she is no longer able to consistently perform activities of daily living independently.      Acknowledges deficits and inability to care for herself independently.  Unable to self administer medications as prescribed.  Does not adhere to provider recommendations regarding long-term memory care assisted living facility placement.    Limited access to 24 hour care and supervision through family members, friends, neighbors, church community, healthcare power of attorney, and paid in-home caregiver.   Clinical Goal(s):   Over the next 30 days, patient will be placed into a long-term memory care assisted living facility of her choice, preferably [facility] in [place], [place].  Patient and Niece/Healthcare Power of [place], [name] will work with LCSW to coordinate needs for long-term memory care assisted living placement. Interventions:  Patient interviewed and appropriate assessments performed.  Collaboration with Primary Care Physician, Dr. Einar Pheasant, regarding development and update of comprehensive plan of care as evidenced by provider attestation and co-signature.  Inter-disciplinary care team collaboration (see longitudinal plan of care).  Assessed needs and provided education on level of care and facility placement process.  Collaboration with Primary Care Physician, Dr. Einar Pheasant, to request completion of an FL-2 Form.    Obtained verbal consent from patient's Niece/Healthcare Power of [place] Mood to fax patient's FL-2 Form to all facilities of interest, as well as obtain PASSAR number.   Reviewed and initiated process to obtain PASSAR number.  Encouraged patient and Niece/Healthcare Power of  [place] Rack, [name] to provide LCSW with at least 4 long-term memory care assisted living facilities of interest, in the event that [place] in [facility] does not have a female bed available.  Assisted Niece/Healthcare Power of Augustina Mood, with understanding facility selection process.  LCSW agreed to fax patient's FL-2 Form to all facilities of interest, once completed and signed FL-2 Form is obtained from Primary Care Physician, Dr. Einar Pheasant.  Provided patient and Niece/Healthcare Power of [place], [name], with information on all long-term memory care assisted living facilities in [place].  Discussed plans with Niece/Healthcare Power of Augustina Mood, regarding care management follow-up and provided direct contact information for care management team.  Advised patient to continue receiving in-home care services Monday through Sunday, from 10:00am until 3:00pm, to assist with activities of daily living and to provide supervision.    Assisted patient and Niece/Healthcare Power of [place], [name], with obtaining information about health plan benefits.  Encouraged Niece/Healthcare Power of Augustina Mood, to continue to discuss long-term memory care assisted living placement with patient so that she will begin to feel more comfortable with the idea. Patient/Healthcare Power of Attorney Goals:   Patient and Niece/Healthcare Power of [place] Rack, [name], will review contents of resource packet to ensure understanding.    Patient will review list of long-term memory care assisted living facilities with her Niece/Healthcare Power of [place]eta Rack, [name], and share these facilities of interest will LCSW during the next scheduled telephone outreach call.  Patient will continue to receive in-home care services 5 hours per day/7 days per week, through a private pay agency.    Patient and  Niece/Healthcare Power of [place] Rack, [name], will work with LCSW on a weekly basis to secure long-term memory care assisted living facility placement for patient.  LCSW will fax patient's completed and signed FL-2 Form to all facilities of interest, once obtained, to try and pursue bed offers.  LCSW will obtain PASARR number. Follow-Up:  12/14/2020 at 11:30am.      Follow Up Plan: 12/14/2020 at 11:30am.      Nat Christen LCSW Licensed Clinical Social Worker [place]  [phone]

## 2020-12-08 NOTE — Patient Instructions (Signed)
Visit Information   PATIENT GOALS:  Goals Addressed            This Visit's Progress   . Planning for Long-Term Care-Dementia.   On track    Timeframe:  Short-Term Goal Priority:  High Start Date:       12/07/2020                      Expected End Date:    01/07/2021                      Patient/Healthcare Power of Attorney Goals:   Patient and Niece/Healthcare Power of [place] Rack, [name], will review contents of resource packet to ensure understanding.    Patient will review list of long-term memory care assisted living facilities with her Niece/Healthcare Power of Oneta Rack, [name], and share these facilities of interest will LCSW during the next scheduled telephone outreach call.  Patient will continue to receive in-home care services 5 hours per day/7 days per week, through a private pay agency.    Patient and Niece/Healthcare Power of Oneta Rack, [name], will work with LCSW on a weekly basis to secure long-term memory care assisted living facility placement for patient.  LCSW will fax patient's completed and signed FL-2 Form to all facilities of interest, once obtained, to try and pursue bed offers.  LCSW will obtain PASARR number. Follow-Up:  12/14/2020 at 11:30am.       Consent to CCM Services: Ms. [place] was given information about Chronic Care Management services today including:  1. CCM service includes personalized support from designated clinical staff supervised by her physician, including individualized plan of care and coordination with other care providers 2. 24/7 contact phone numbers for assistance for urgent and routine care needs. 3. Service will only be billed when office clinical staff spend 20 minutes or more in a month to coordinate care. 4. Only one practitioner may furnish and bill the service in a calendar month. 5. The patient may stop CCM services at any time (effective at the end of the month) by phone call to the office  staff. 6. The patient will be responsible for cost sharing (co-pay) of up to 20% of the service fee (after annual deductible is met).  Patient agreed to services and verbal consent obtained.   Patient verbalizes understanding of instructions provided today and agrees to view in [place].   Telephone follow up appointment with care management team member scheduled for:  12/14/2020 at 11:30am  [place] Licensed Clinical Social Worker [place]  [phone]  CLINICAL CARE PLAN: Patient Care Plan: LCSW Plan of Care    Problem Identified: Planning for Long-Term Care - Dementia.   Priority: High    Goal: Planning for Long-Term Care - Dementia.   Start Date: 12/07/2020  Expected End Date: 01/07/2021  This Visit's Progress: On track  Priority: High  Note:   Current Barriers:    Patient with Osteoporosis, Osteoarthritis, Compression Fracture of T-8 Vertebra, Weakness, Unsteady Gait, Cognitive Impairment, Dizziness, Blindness, Deafness and History of Falls, would benefit from 24 hour care and supervision in a long-term memory care assisted living facility.  Patient's health has declined and she is no longer able to consistently perform activities of daily living independently.      Acknowledges deficits and inability to care for herself independently.  Unable to self administer medications as prescribed.  Does not adhere to provider recommendations regarding long-term memory care assisted  living facility placement.    Limited access to 24 hour care and supervision through family members, friends, neighbors, church community, healthcare power of attorney, and paid in-home caregiver.   Clinical Goal(s):   Over the next 30 days, patient will be placed into a long-term memory care assisted living facility of her choice, preferably [facility] in [place], [place].  Patient and Niece/Healthcare Power of Oneta Rack, [name] will work with LCSW to  coordinate needs for long-term memory care assisted living placement. Interventions:  Patient interviewed and appropriate assessments performed.  Collaboration with Primary Care Physician, Dr. Einar Pheasant, regarding development and update of comprehensive plan of care as evidenced by provider attestation and co-signature.  Inter-disciplinary care team collaboration (see longitudinal plan of care).  Assessed needs and provided education on level of care and facility placement process.  Collaboration with Primary Care Physician, Dr. Einar Pheasant, to request completion of an FL-2 Form.    Obtained verbal consent from patient's Niece/Healthcare Power of Augustina Mood to fax patient's FL-2 Form to all facilities of interest, as well as obtain PASSAR number.   Reviewed and initiated process to obtain PASSAR number.  Encouraged patient and Niece/Healthcare Power of Oneta Rack, [name] to provide LCSW with at least 4 long-term memory care assisted living facilities of interest, in the event that [facility] in [facility] does not have a female bed available.  Assisted Niece/Healthcare Power of Augustina Mood, with understanding facility selection process.  LCSW agreed to fax patient's FL-2 Form to all facilities of interest, once completed and signed FL-2 Form is obtained from Primary Care Physician, Dr. Einar Pheasant.  Provided patient and Niece/Healthcare Power of [place], [name], with information on all long-term memory care assisted living facilities in [place].  Discussed plans with Niece/Healthcare Power of Augustina Mood, regarding care management follow-up and provided direct contact information for care management team.  Advised patient to continue receiving in-home care services Monday through Sunday, from 10:00am until 3:00pm, to assist with activities of daily living and to provide supervision.     Assisted patient and Niece/Healthcare Power of [place], [name], with obtaining information about health plan benefits.  Encouraged Niece/Healthcare Power of Augustina Mood, to continue to discuss long-term memory care assisted living placement with patient so that she will begin to feel more comfortable with the idea. Patient/Healthcare Power of Attorney Goals:   Patient and Niece/Healthcare Power of Oneta Rack, [name], will review contents of resource packet to ensure understanding.    Patient will review list of long-term memory care assisted living facilities with her Niece/Healthcare Power of Oneta Rack, [name], and share these facilities of interest will LCSW during the next scheduled telephone outreach call.  Patient will continue to receive in-home care services 5 hours per day/7 days per week, through a private pay agency.    Patient and Niece/Healthcare Power of Oneta Rack, [name], will work with LCSW on a weekly basis to secure long-term memory care assisted living facility placement for patient.  LCSW will fax patient's completed and signed FL-2 Form to all facilities of interest, once obtained, to try and pursue bed offers.  LCSW will obtain PASARR number. Follow-Up:  12/14/2020 at 11:30am.

## 2020-12-09 DIAGNOSIS — M8008XD Age-related osteoporosis with current pathological fracture, vertebra(e), subsequent encounter for fracture with routine healing: Secondary | ICD-10-CM | POA: Diagnosis not present

## 2020-12-09 DIAGNOSIS — W19XXXD Unspecified fall, subsequent encounter: Secondary | ICD-10-CM | POA: Diagnosis not present

## 2020-12-09 DIAGNOSIS — S22060D Wedge compression fracture of T7-T8 vertebra, subsequent encounter for fracture with routine healing: Secondary | ICD-10-CM | POA: Diagnosis not present

## 2020-12-09 DIAGNOSIS — F0390 Unspecified dementia without behavioral disturbance: Secondary | ICD-10-CM | POA: Diagnosis not present

## 2020-12-09 DIAGNOSIS — K2270 Barrett's esophagus without dysplasia: Secondary | ICD-10-CM | POA: Diagnosis not present

## 2020-12-09 DIAGNOSIS — K219 Gastro-esophageal reflux disease without esophagitis: Secondary | ICD-10-CM | POA: Diagnosis not present

## 2020-12-13 DIAGNOSIS — S22060D Wedge compression fracture of T7-T8 vertebra, subsequent encounter for fracture with routine healing: Secondary | ICD-10-CM | POA: Diagnosis not present

## 2020-12-13 DIAGNOSIS — M8008XD Age-related osteoporosis with current pathological fracture, vertebra(e), subsequent encounter for fracture with routine healing: Secondary | ICD-10-CM | POA: Diagnosis not present

## 2020-12-13 DIAGNOSIS — W19XXXD Unspecified fall, subsequent encounter: Secondary | ICD-10-CM | POA: Diagnosis not present

## 2020-12-13 DIAGNOSIS — K219 Gastro-esophageal reflux disease without esophagitis: Secondary | ICD-10-CM | POA: Diagnosis not present

## 2020-12-13 DIAGNOSIS — K2270 Barrett's esophagus without dysplasia: Secondary | ICD-10-CM | POA: Diagnosis not present

## 2020-12-13 DIAGNOSIS — F0390 Unspecified dementia without behavioral disturbance: Secondary | ICD-10-CM | POA: Diagnosis not present

## 2020-12-14 ENCOUNTER — Ambulatory Visit: Payer: Medicare Other | Admitting: *Deleted

## 2020-12-14 DIAGNOSIS — R4189 Other symptoms and signs involving cognitive functions and awareness: Secondary | ICD-10-CM

## 2020-12-14 DIAGNOSIS — R2681 Unsteadiness on feet: Secondary | ICD-10-CM

## 2020-12-14 DIAGNOSIS — W19XXXD Unspecified fall, subsequent encounter: Secondary | ICD-10-CM

## 2020-12-14 DIAGNOSIS — G459 Transient cerebral ischemic attack, unspecified: Secondary | ICD-10-CM

## 2020-12-14 DIAGNOSIS — R531 Weakness: Secondary | ICD-10-CM

## 2020-12-14 DIAGNOSIS — R413 Other amnesia: Secondary | ICD-10-CM

## 2020-12-14 DIAGNOSIS — S22060D Wedge compression fracture of T7-T8 vertebra, subsequent encounter for fracture with routine healing: Secondary | ICD-10-CM

## 2020-12-14 DIAGNOSIS — R42 Dizziness and giddiness: Secondary | ICD-10-CM

## 2020-12-14 NOTE — Chronic Care Management (AMB) (Signed)
Chronic Care Management    Clinical Social Work Note  12/14/2020 Name: Christy Cisneros MRN: [id number] DOB: Apr 08, 1920  Christy Cisneros is a 87 y.o. year old female who is a primary care patient of Einar Pheasant, MD. The CCM team was consulted to assist the patient with chronic disease management and/or care coordination needs related to: Level of Care Concerns in Patient with Osteoporosis, Osteoarthritis, Weakness, Unsteady Gait, Cognitive Impairment, History of Falls/Risks, Dizziness.   Engaged with patient and patient's niece/healthcare power of attorney, Christy Cisneros by telephone for follow up visit in response to provider referral for social work chronic care management and care coordination services.   Consent to Services:  The patient was given information about Chronic Care Management services, agreed to services, and gave verbal consent prior to initiation of services.  Please see initial visit note for detailed documentation.   Patient agreed to services and consent obtained.   Assessment: Review of patient past medical history, allergies, medications, and health status, including review of relevant consultants reports was performed today as part of a comprehensive evaluation and provision of chronic care management and care coordination services.     SDOH (Social Determinants of Health) assessments and interventions performed:    Advanced Directives Status: Not addressed in this encounter.  CCM Care Plan  Allergies  Allergen Reactions  . Bacitracin Other (See Comments)    Tearing, itching, swelling, redness Other Reaction: tearing, swollen red eyes Tearing, itching, swelling, redness  . Cephalexin Other (See Comments)    HA, nausea, gas, abdominal pain Other reaction(s): Abdominal Pain, Other (See Comments) Other Reaction: N&V HA, nausea, gas, abdominal pain  . Codeine Other (See Comments)    HA, nausea, gas, abdominal pain Other reaction(s): Abdominal  Pain Other Reaction: N&V HA, nausea, gas, abdominal pain  . Avelox [Moxifloxacin Hcl In Nacl] Other (See Comments)    Dizziness, weakness  . Cefdinir Nausea And Vomiting  . Clarithromycin     HA, nausea, gas, abdominal pain  . Hydrocodone-Acetaminophen     REACTION: chest pain, constipation, nausea  . Lansoprazole     REACTION: cramps, nausea, diarrhea  . Polymyxin B     Itching and redness  . Vigamox [Moxifloxacin] Itching    Soreness, irritability, loss of appetite,  Dizziness, diarrhea, muscle weakness  . Brimonidine Itching    Burning, stinging, bloody eye, dry mouth Burning, stinging, bloody eye, dry mouth Burning, stinging, bloody eye, dry mouth  . Erythromycin Rash    HA, nausea, gas, abdominal pain HA, nausea, gas, abdominal pain    Outpatient Encounter Medications as of 12/14/2020  Medication Sig  . omeprazole (PRILOSEC) 20 MG capsule TAKE ONE CAPSULE TWICE A DAY BEFORE MEALS  . tobramycin (TOBREX) 0.3 % ophthalmic solution    No facility-administered encounter medications on file as of 12/14/2020.    Patient Active Problem List   Diagnosis Date Noted  . Leg laceration 11/29/2020  . Compression fracture of T8 vertebra ([place]) 11/20/2020  . Blindness 11/20/2020  . Fall 11/14/2020  . Cognitive impairment 11/14/2020  . Encounter for completion of form with patient 10/10/2020  . Toe pain 06/07/2020  . Weight loss 03/25/2019  . Pain due to onychomycosis of toenails of both feet 03/11/2019  . Depression 05/06/2018  . Abrasion 02/28/2018  . History of CVA (cerebrovascular accident) 03/16/2017  . Memory change 09/04/2016  . Pneumonia 07/17/2016  . Mild cognitive impairment 05/15/2016  . Dizziness 04/21/2016  . Neck pain 02/11/2016  . Muscle cramps 11/14/2015  .  Urine incontinence 08/16/2015  . Hematoma 08/16/2015  . Weakness 07/12/2015  . Headache 06/16/2015  . Unsteady gait 06/01/2015  . GERD (gastroesophageal reflux disease) 08/11/2014  . DYSPNEA 04/17/2009   . HYPERCHOLESTEROLEMIA 04/16/2009  . Obstructive sleep apnea 04/16/2009  . Glaucoma 04/16/2009  . Transient cerebral ischemia 04/16/2009  . BARRETTS ESOPHAGUS 04/16/2009  . OSTEOARTHRITIS 04/16/2009  . Osteoporosis 04/16/2009    Conditions to be addressed/monitored: Level of Care Concerns in Patient with Osteoporosis, Osteoarthritis, Weakness, Unsteady Gait, Cognitive Impairment, History of Falls/Risks, Dizziness.  Limited Social Support, Level of Care Concerns, ADL/IADL Limitations, Mental Health Concerns, Social Isolation, Limited Access to Caregiver, Cognitive Deficits and Memory Deficits.  Care Plan : LCSW Plan of Care  Updates made by Francis Gaines, LCSW since 12/14/2020 12:00 AM    Problem: Planning for Long-Term Care - Dementia.   Priority: High    Goal: Planning for Long-Term Care - Dementia.   Start Date: 12/07/2020  Expected End Date: 01/07/2021  This Visit's Progress: On track  Recent Progress: On track  Priority: High  Note:   Current Barriers:    Patient with Osteoporosis, Osteoarthritis, Compression Fracture of T-8 Vertebra, Weakness, Unsteady Gait, Cognitive Impairment, Dizziness, Blindness, Deafness and History of Falls, would benefit from 24 hour care and supervision in a long-term memory care assisted living facility.  Patient's health has declined and she is no longer able to consistently perform activities of daily living independently.      Acknowledges deficits and inability to care for herself independently.  Unable to self administer medications as prescribed.  Does not adhere to provider recommendations regarding long-term memory care assisted living facility placement.    Limited access to 24 hour care and supervision through family members, friends, neighbors, church community, healthcare power of attorney, and paid in-home caregiver.   Clinical Goal(s):   Over the next 30 days, patient will be placed into a long-term memory care assisted living  facility of her choice, preferably [facility] in [place], [place].  Patient and Niece/Healthcare Power of Oneta Rack, Christy Cisneros will work with LCSW to coordinate needs for long-term memory care assisted living placement. Interventions:  Patient interviewed and appropriate assessments performed.  Collaboration with Primary Care Physician, Dr. Einar Pheasant, regarding development and update of comprehensive plan of care as evidenced by provider attestation and co-signature.  Inter-disciplinary care team collaboration (see longitudinal plan of care).  Assessed needs and provided education on level of care and facility placement process.  Collaboration with Primary Care Physician, Dr. Einar Pheasant, to request completion of an FL-2 Form.    Obtained verbal consent from patient's Niece/Healthcare Power of Augustina Mood to fax patient's FL-2 Form to all facilities of interest, as well as obtain PASSAR number.   Reviewed and initiated process to obtain PASSAR number.  Encouraged patient and Niece/Healthcare Power of Oneta Rack, Christy Cisneros to provide LCSW with at least 4 long-term memory care assisted living facilities of interest, in the event that [facility] in [facility] does not have a female bed available.  Assisted Niece/Healthcare Power of Augustina Mood, with understanding facility selection process.  LCSW agreed to fax patient's FL-2 Form to all facilities of interest, once completed and signed FL-2 Form is obtained from Primary Care Physician, Dr. Einar Pheasant.  Provided patient and Niece/Healthcare Power of [place], Christy Cisneros, with information on all long-term memory care assisted living facilities in [place].  Discussed plans with Niece/Healthcare Power of Augustina Mood,  regarding care management follow-up and provided direct contact information for care management team.  Advised  patient to continue receiving in-home care services Monday through Sunday, from 10:00am until 3:00pm, to assist with activities of daily living and to provide supervision.    Assisted patient and Niece/Healthcare Power of [place], Christy Cisneros, with obtaining information about health plan benefits.  Encouraged Niece/Healthcare Power of Augustina Mood, to continue to discuss long-term memory care assisted living placement with patient so that she will begin to feel more comfortable with the idea. Patient/Healthcare Power of Attorney Goals:   Patient will review list of long-term memory care assisted living facilities with her Niece/Healthcare Power of Attorney, Christy Cisneros, and share these facilities of interest with LCSW.  Only interested in [facility] in [place], [place].  Patient will continue to receive in-home care services 5 hours per day/7 days per week, through a private pay agency.    Patient and Niece/Healthcare Power of Oneta Rack, Christy Cisneros, will work with LCSW on a weekly basis to secure long-term memory care assisted living facility placement for patient.  LCSW faxed patient's completed and signed FL-2 Form to [facility] in [place], [place], to try and pursue a bed offer.  Admissions Coordinator at [facility] confirmed receipt.  PASARR number obtained.  Patient's Niece/Healthcare Power of Oneta Rack, Christy Cisneros agreed to contact patient's Pension plan to determine whether or not it can be cashed out, then report findings to LCSW.  Follow-Up:  12/21/2020 at 11:00am.      Follow Up Plan:  12/21/2020 at 11:00am.      Nat Christen LCSW Licensed Clinical Social Worker [place]  [phone]

## 2020-12-14 NOTE — Patient Instructions (Signed)
Visit Information  PATIENT GOALS: Goals Addressed              This Visit's Progress   .  Planning for Long-Term Care-Dementia. (pt-stated)   On track     Timeframe:  Short-Term Goal Priority:  High Start Date:       12/07/2020                      Expected End Date:    01/07/2021                      Patient/Healthcare Power of Attorney Goals:   Patient will review list of long-term memory care assisted living facilities with her Niece/Healthcare Power of [place], [name], and share these facilities of interest with LCSW.  Only interested in [facility] in [place], [place].  Patient will continue to receive in-home care services 5 hours per day/7 days per week, through a private pay agency.    Patient and Niece/Healthcare Power of Oneta Rack, [name], will work with LCSW on a weekly basis to secure long-term memory care assisted living facility placement for patient.  LCSW faxed patient's completed and signed FL-2 Form to [facility] in [place], [place], to try and pursue a bed offer.  Admissions Coordinator at [facility] confirmed receipt.  PASARR number obtained.  Patient's Niece/Healthcare Power of Oneta Rack, [name] agreed to contact patient's Pension plan to determine whether or not it can be cashed out, then report findings to LCSW.  Follow-Up:  12/21/2020 at 11:00am.       Patient verbalizes understanding of instructions provided today and agrees to view in [place].   Telephone follow up appointment with care management team member scheduled for:  12/21/2020 at 11:00am.  Nat Christen LCSW Licensed Clinical Social Worker [place]  [phone]

## 2020-12-16 DIAGNOSIS — M8008XD Age-related osteoporosis with current pathological fracture, vertebra(e), subsequent encounter for fracture with routine healing: Secondary | ICD-10-CM | POA: Diagnosis not present

## 2020-12-16 DIAGNOSIS — K2270 Barrett's esophagus without dysplasia: Secondary | ICD-10-CM | POA: Diagnosis not present

## 2020-12-16 DIAGNOSIS — K219 Gastro-esophageal reflux disease without esophagitis: Secondary | ICD-10-CM | POA: Diagnosis not present

## 2020-12-16 DIAGNOSIS — F0390 Unspecified dementia without behavioral disturbance: Secondary | ICD-10-CM | POA: Diagnosis not present

## 2020-12-16 DIAGNOSIS — W19XXXD Unspecified fall, subsequent encounter: Secondary | ICD-10-CM | POA: Diagnosis not present

## 2020-12-16 DIAGNOSIS — S22060D Wedge compression fracture of T7-T8 vertebra, subsequent encounter for fracture with routine healing: Secondary | ICD-10-CM | POA: Diagnosis not present

## 2020-12-18 ENCOUNTER — Encounter: Payer: Self-pay | Admitting: Emergency Medicine

## 2020-12-18 ENCOUNTER — Emergency Department
Admission: EM | Admit: 2020-12-18 | Discharge: 2020-12-19 | Disposition: A | Discharge status: Home / Self Care | Payer: Medicare Other | Attending: Emergency Medicine | Admitting: Emergency Medicine

## 2020-12-18 ENCOUNTER — Other Ambulatory Visit: Payer: Self-pay

## 2020-12-18 DIAGNOSIS — R2681 Unsteadiness on feet: Secondary | ICD-10-CM | POA: Diagnosis not present

## 2020-12-18 DIAGNOSIS — R531 Weakness: Secondary | ICD-10-CM | POA: Diagnosis not present

## 2020-12-18 DIAGNOSIS — R0902 Hypoxemia: Secondary | ICD-10-CM | POA: Diagnosis not present

## 2020-12-18 DIAGNOSIS — I959 Hypotension, unspecified: Secondary | ICD-10-CM | POA: Diagnosis not present

## 2020-12-18 DIAGNOSIS — Z20822 Contact with and (suspected) exposure to covid-19: Secondary | ICD-10-CM | POA: Insufficient documentation

## 2020-12-18 DIAGNOSIS — R9431 Abnormal electrocardiogram [ECG] [EKG]: Secondary | ICD-10-CM | POA: Diagnosis not present

## 2020-12-18 DIAGNOSIS — E860 Dehydration: Secondary | ICD-10-CM | POA: Diagnosis not present

## 2020-12-18 LAB — CBC
HCT: 39.9 % (ref 36.0–46.0)
Hemoglobin: 13.1 g/dL (ref 12.0–15.0)
MCH: 30.5 pg (ref 26.0–34.0)
MCHC: 32.8 g/dL (ref 30.0–36.0)
MCV: 93 fL (ref 80.0–100.0)
Platelets: 285 10*3/uL (ref 150–400)
RBC: 4.29 MIL/uL (ref 3.87–5.11)
RDW: 14.3 % (ref 11.5–15.5)
WBC: 6.8 10*3/uL (ref 4.0–10.5)
nRBC: 0 % (ref 0.0–0.2)

## 2020-12-18 LAB — BASIC METABOLIC PANEL
Anion gap: 9 (ref 5–15)
BUN: 40 mg/dL — ABNORMAL HIGH (ref 8–23)
CO2: 23 mmol/L (ref 22–32)
Calcium: 9.4 mg/dL (ref 8.9–10.3)
Chloride: 109 mmol/L (ref 98–111)
Creatinine, Ser: 1.24 mg/dL — ABNORMAL HIGH (ref 0.44–1.00)
GFR, Estimated: 39 mL/min — ABNORMAL LOW (ref >60)
Glucose, Bld: 114 mg/dL — ABNORMAL HIGH (ref 70–99)
Potassium: 4 mmol/L (ref 3.5–5.1)
Sodium: 141 mmol/L (ref 135–145)

## 2020-12-18 MED ORDER — SODIUM CHLORIDE 0.9 % IV SOLN: Freq: Once | INTRAVENOUS | Status: AC

## 2020-12-18 NOTE — ED Triage Notes (Signed)
Pt to ED via [facility], per EMS pt was outside all day then leaned against a light pole, per EMS systolic BP 80, increased to 162 systolic after 446XF NS.   When asked orientation questions pt states "who the hell cares" as response to asking patient the year, pt pre-occupied with getting a drink of water, pt refusing to answer further orientation questions.

## 2020-12-18 NOTE — ED Provider Notes (Signed)
[facility] Emergency Department Provider Note  Time seen: 11:15 PM  I have reviewed the triage vital signs and the nursing notes.   HISTORY  Chief Complaint Weakness   HPI Christy Cisneros is a 87 y.o. female with a past medical history of depression, gastric reflux, hyperlipidemia, arthritis, history of past CVA, cognitive impairment, presents to the emergency department by EMS.  According to report patient was found leaning up against a light pole outside.  Is not clear how long the patient was outside for but it was very hot today and the patient was found to be hypotensive initially however blood pressure improved with 500 cc of normal saline prior to arrival.  Here the patient is awake and alert but she is not able to provide any meaningful history.  Is not able to provide history on who she lives with or where she lives, per report she lives at home possibly alone with caregivers during the daytime.  Per report she is also trying to get into a skilled facility.  Patient has no complaints currently.   Past Medical History:  Diagnosis Date  . Barrett's esophagus with esophagitis   . Chronic bronchitis ([place])   . Depression   . GERD (gastroesophageal reflux disease)   . Glaucoma   . Hypercholesteremia   . Osteoarthritis   . Osteoporosis   . Sleep apnea    has CPAP  . TIA (transient ischemic attack)     Patient Active Problem List   Diagnosis Date Noted  . Leg laceration 11/29/2020  . Compression fracture of T8 vertebra ([place]) 11/20/2020  . Blindness 11/20/2020  . Fall 11/14/2020  . Cognitive impairment 11/14/2020  . Encounter for completion of form with patient 10/10/2020  . Toe pain 06/07/2020  . Weight loss 03/25/2019  . Pain due to onychomycosis of toenails of both feet 03/11/2019  . Depression 05/06/2018  . Abrasion 02/28/2018  . History of CVA (cerebrovascular accident) 03/16/2017  . Memory change 09/04/2016  . Pneumonia 07/17/2016  .  Mild cognitive impairment 05/15/2016  . Dizziness 04/21/2016  . Neck pain 02/11/2016  . Muscle cramps 11/14/2015  . Urine incontinence 08/16/2015  . Hematoma 08/16/2015  . Weakness 07/12/2015  . Headache 06/16/2015  . Unsteady gait 06/01/2015  . GERD (gastroesophageal reflux disease) 08/11/2014  . DYSPNEA 04/17/2009  . HYPERCHOLESTEROLEMIA 04/16/2009  . Obstructive sleep apnea 04/16/2009  . Glaucoma 04/16/2009  . Transient cerebral ischemia 04/16/2009  . BARRETTS ESOPHAGUS 04/16/2009  . OSTEOARTHRITIS 04/16/2009  . Osteoporosis 04/16/2009    Past Surgical History:  Procedure Laterality Date  . BUNIONECTOMY     left  . ROTATOR CUFF REPAIR     right    Prior to Admission medications   Medication Sig Start Date End Date Taking? Authorizing Provider  omeprazole (PRILOSEC) 20 MG capsule TAKE ONE CAPSULE TWICE A DAY BEFORE MEALS 11/17/16   Einar Pheasant, MD  tobramycin (TOBREX) 0.3 % ophthalmic solution  11/02/17   [provider]    Allergies  Allergen Reactions  . Bacitracin Other (See Comments)    Tearing, itching, swelling, redness Other Reaction: tearing, swollen red eyes Tearing, itching, swelling, redness  . Cephalexin Other (See Comments)    HA, nausea, gas, abdominal pain Other reaction(s): Abdominal Pain, Other (See Comments) Other Reaction: N&V HA, nausea, gas, abdominal pain  . Codeine Other (See Comments)    HA, nausea, gas, abdominal pain Other reaction(s): Abdominal Pain Other Reaction: N&V HA, nausea, gas, abdominal pain  .  Avelox [Moxifloxacin Hcl In Nacl] Other (See Comments)    Dizziness, weakness  . Cefdinir Nausea And Vomiting  . Clarithromycin     HA, nausea, gas, abdominal pain  . Hydrocodone-Acetaminophen     REACTION: chest pain, constipation, nausea  . Lansoprazole     REACTION: cramps, nausea, diarrhea  . Polymyxin B     Itching and redness  . Vigamox [Moxifloxacin] Itching    Soreness, irritability, loss of appetite,   Dizziness, diarrhea, muscle weakness  . Brimonidine Itching    Burning, stinging, bloody eye, dry mouth Burning, stinging, bloody eye, dry mouth Burning, stinging, bloody eye, dry mouth  . Erythromycin Rash    HA, nausea, gas, abdominal pain HA, nausea, gas, abdominal pain    Family History  Problem Relation Age of Onset  . Breast cancer Sister   . Heart disease Mother   . Alcohol abuse Father     Social History Social History   Tobacco Use  . Smoking status: Never Smoker  . Smokeless tobacco: Never Used  Vaping Use  . Vaping Use: Never used  Substance Use Topics  . Alcohol use: Yes    Alcohol/week: 0.0 standard drinks    Comment: wine/brandy occasional  . Drug use: No    Review of Systems Unable to obtain an adequate/accurate review of systems secondary to confusion which may be baseline.  ____________________________________________   PHYSICAL EXAM:  VITAL SIGNS: ED Triage Vitals [12/18/20 1756]  Enc Vitals Group     BP 132/83     Pulse Rate 92     Resp 20     Temp 97.7 F (36.5 C)     Temp Source Oral     SpO2 94 %     Weight 100 lb 1.4 oz (45.4 kg)     Height 5' (1.524 m)     Head Circumference      [facility] Flow      Pain Score 10     Pain Loc      Pain Edu?      Excl. in [place]?     Constitutional: Awake and alert but disoriented.  Calm and cooperative.  Hard of hearing. Eyes: Normal exam ENT      Head: Normocephalic and atraumatic.      Mouth/Throat: Mucous membranes are moist. Cardiovascular: Normal rate, regular rhythm. Respiratory: Normal respiratory effort without tachypnea nor retractions. Breath sounds are clear  Gastrointestinal: Soft and nontender. No distention.  Musculoskeletal: Nontender with normal range of motion in all extremities.  Neurologic:  Normal speech and language. No gross focal neurologic deficits  Skin:  Skin is warm, dry and intact.  Psychiatric: Mood and affect are normal.    ____________________________________________    EKG  EKG viewed and interpreted by myself shows a sinus rhythm at 91 bpm with a narrow QRS, normal axis, normal intervals, nonspecific ST changes.  ____________________________________________   INITIAL IMPRESSION / ASSESSMENT AND PLAN / ED COURSE  Pertinent labs & imaging results that were available during my care of the patient were reviewed by me and considered in my medical decision making (see chart for details).   Patient presents to the emergency department by EMS found outside.  Currently the patient is awake and alert she does seem confused she is not able to tell me who she lives with, per report she possibly lives alone.  Unable to reach any family members currently.  Lab work shows mild renal insufficiency compared to baseline we will IV hydrate we  will check a urine sample as well as a COVID swab.  We will have social work see in the morning to help with disposition.  I do not believe in the patient's current states she could be discharged home unless we have a responsible party for the patient.  COVID-negative.  Social work and PT evaluation pending.  Christy Cisneros was evaluated in Emergency Department on 12/18/2020 for the symptoms described in the history of present illness. She was evaluated in the context of the global COVID-19 pandemic, which necessitated consideration that the patient might be at risk for infection with the SARS-CoV-2 virus that causes COVID-19. Institutional protocols and algorithms that pertain to the evaluation of patients at risk for COVID-19 are in a state of rapid change based on information released by regulatory bodies including the CDC and federal and state organizations. These policies and algorithms were followed during the patient's care in the ED.  ____________________________________________   FINAL CLINICAL IMPRESSION(S) / ED DIAGNOSES  Dehydration   Harvest Dark, MD 12/19/20  [phone]

## 2020-12-18 NOTE — ED Notes (Signed)
Pt is awake and alert; extremely HOH even with L ear hearing aid.  Oriented to NAME only.  Poor historian and very forgetful -- is anxious and paranoid -- repeatedly asking "how did I get here", "how will I get home", "where is my friend" -- educated pt that she was brought in by ambulance and pt still continues to ask how she arrived here -- does not retain information well.  When asked who pt lives with pt reports she lives alone -- unknown if this accurate.  Concerned for patient ability to care for herself.

## 2020-12-19 DIAGNOSIS — E860 Dehydration: Secondary | ICD-10-CM | POA: Diagnosis not present

## 2020-12-19 LAB — URINALYSIS, COMPLETE (UACMP) WITH MICROSCOPIC
Bacteria, UA: NONE SEEN
Bilirubin Urine: NEGATIVE
Glucose, UA: 50 mg/dL — AB
Hgb urine dipstick: NEGATIVE
Ketones, ur: NEGATIVE mg/dL
Leukocytes,Ua: NEGATIVE
Nitrite: NEGATIVE
Protein, ur: 30 mg/dL — AB
Specific Gravity, Urine: 1.02 (ref 1.005–1.030)
pH: 5 (ref 5.0–8.0)

## 2020-12-19 LAB — RESP PANEL BY RT-PCR (FLU A&B, COVID) ARPGX2
Influenza A by PCR: NEGATIVE
Influenza B by PCR: NEGATIVE
SARS Coronavirus 2 by RT PCR: NEGATIVE

## 2020-12-19 MED ORDER — TRAZODONE HCL 100 MG PO TABS
100.0000 mg | ORAL_TABLET | Freq: Every day | ORAL | Status: DC
  Administered 2020-12-19: 100 mg via ORAL
  Filled 2020-12-19: qty 1

## 2020-12-19 NOTE — ED Notes (Signed)
Pt oob to in room commode  with assistance of ED tech who is sitting with patient for safety -- scant amount of urine in toilet hat.  Pt then assisted back to bed; pericare performed and clean adult brief placed.  Pt given milk to drink per request.  No distress noted.  NS continues to infuse @150ml /hr to 20G R AC

## 2020-12-19 NOTE — ED Provider Notes (Signed)
Procedures     ----------------------------------------- 10:07 AM on 12/19/2020 ----------------------------------------- Caregiver at bedside.  They note that patient can be immediately arranged to have home health during nighttime hours as well to ensure that she is safe throughout the night and not wandering outside.  They have already arranged for SNF placement which will begin in about a week.  They feel comfortable taking her home, she is medically stable.  No additional safety needs at this time     Carrie Mew, MD 12/19/20 1008

## 2020-12-19 NOTE — ED Notes (Addendum)
Pt oob with assistance by staff to bedside commode. Pt assisted back into bed. Resting comfortably with call light in reach. Bed alarm activated and audible. Bed in lowest position, wheels locked. DNR bracelet applied to left wrist per MD order.

## 2020-12-19 NOTE — ED Notes (Addendum)
Christy Cisneros,PT left the bedside at this time. Pt in recliner with bed alarm activated and audible. Call light in reach. Nonskid socks in place, wheels on recliner locked. Table with pt belongings at patients side in reach.

## 2020-12-19 NOTE — ED Notes (Signed)
Pt observed attempting to exit recliner chair without assistance -- when nurse entered room pt reports needing to use bathroom - this nurse assisted pt to in-room commode -- pt had unmeasured urinary episode on commode (did place hat in the commode however pt missed the hat when voiding).  Pericare performed; hospital socks and gown placed on pt, personal clothing items in belongings bag - fall risk band on and bed alarm set after pt then assisted to stretcher.   Pt restless with monitor leads, bp cuff and pulse ox - monitoring lines removed to help decrease pt restless/anxious behavior.  Will monitor for acute changes and maintain plan of care.

## 2020-12-19 NOTE — ED Notes (Signed)
Late entry-- have spoken to patient niece Con Memos via telephone --niece advises she is POA (have asked for niece to fax POA paperwork so it can be scanned to pt record) - niece updated on plan for case mgmt/social worker consult for placement due to concern for patient not safe living home alone -- niece reports patient is on list for [facility] unit in [place]; reports she was originally going to move in however another resident took priority and was given the room that patient was due to get.

## 2020-12-19 NOTE — ED Notes (Signed)
Patient is resting comfortably. Call light in reach. Bed alarm activated and audible at this time. Bed locked and in lowest position.

## 2020-12-19 NOTE — Progress Notes (Signed)
Physical Therapy Evaluation Patient Details Name: Christy Cisneros MRN: [id number] DOB: 04-07-1920 Today's Date: 12/19/2020   History of Present Illness  87 yo female with onset of falls and dizziness was brought to ED, discussion about SNF placement.  Pt has been hypotensive, has wandering, PMHx:   bronchitis, GERD, glaucoma, HOH, OA, osteoporosis, sleep apnea, TIA, stroke, PNA, T8 fracture, blind, incontinent, dyspnea, dementia  Clinical Impression  Pt was seen for mobility assessment with pt being observed to be unstable and unable to self limit her tolerances for mobility.  Pt is HHA but reports SPC, but walker would be more appropriate for mobility with help.  MD has just released her to caregiver, and is expected to go to SNF.  Pt is SNF level of care most appropriately.      Follow Up Recommendations SNF    Equipment Recommendations  None recommended by PT    Recommendations for Other Services       Precautions / Restrictions Precautions Precautions: Fall Precaution Comments: monitor for safety Restrictions Weight Bearing Restrictions: No Other Position/Activity Restrictions: unstable gait      Mobility  Bed Mobility Overal bed mobility: Needs Assistance Bed Mobility: Supine to Sit     Supine to sit: Min assist     General bed mobility comments: min assist to sit up on side of bed    Transfers Overall transfer level: Needs assistance Equipment used: 1 person hand held assist Transfers: Sit to/from Stand Sit to Stand: Min assist         General transfer comment: min assist to get out of chair and off gurney  Ambulation/Gait Ambulation/Gait assistance: Min assist Gait Distance (Feet): 100 Feet Assistive device: 1 person hand held assist Gait Pattern/deviations: Step-through pattern;Decreased stride length;Wide base of support;Trunk flexed Gait velocity: reduced and variable   General Gait Details: shuffling steps and lateral instability  Stairs             Wheelchair Mobility    Modified Rankin (Stroke Patients Only)       Balance Overall balance assessment: Needs assistance Sitting-balance support: Feet supported Sitting balance-Leahy Scale: Fair     Standing balance support: Single extremity supported;During functional activity Standing balance-Leahy Scale: Fair Standing balance comment: pt has poor dynamic standing balance                             Pertinent Vitals/Pain Pain Assessment: Faces Faces Pain Scale: Hurts little more Pain Location: R foot with touch to test strength Pain Descriptors / Indicators: Tender Pain Intervention(s): Limited activity within patient's tolerance;Monitored during session;Repositioned    Home Living Family/patient expects to be discharged to:: Private residence Living Arrangements: Other (Comment) (pt reports caregivers) Available Help at Discharge: Family;Friend(s);Available PRN/intermittently;Personal care attendant Type of Home: Apartment Home Access: Stairs to enter;Ramped entrance     Home Layout: One level Home Equipment: Cane - single point Additional Comments: confusion makes her a poor historian, gives information that conflicts with previous visit    Prior Function Level of Independence: Needs assistance   Gait / Transfers Assistance Needed: SPC or no AD  ADL's / Homemaking Assistance Needed: has caregiver help for her care and house        Hand Dominance   Dominant Hand: Right    Extremity/Trunk Assessment   Upper Extremity Assessment Upper Extremity Assessment: Generalized weakness    Lower Extremity Assessment Lower Extremity Assessment: Generalized weakness    Cervical / Trunk  Assessment Cervical / Trunk Assessment: Kyphotic  Communication   Communication: HOH  Cognition Arousal/Alertness: Awake/alert Behavior During Therapy: Impulsive Overall Cognitive Status: No family/caregiver present to determine baseline cognitive  functioning                                 General Comments: pt is giving conflicting history and cannot remember details      General Comments General comments (skin integrity, edema, etc.): Pt is unsafe and requires help but is not able to give details    Exercises     Assessment/Plan    PT Assessment Patient needs continued PT services  PT Problem List Decreased strength;Decreased range of motion;Decreased activity tolerance;Decreased balance;Decreased mobility;Decreased coordination;Decreased cognition;Decreased knowledge of use of DME;Decreased safety awareness;Cardiopulmonary status limiting activity;Decreased skin integrity;Pain       PT Treatment Interventions DME instruction;Gait training;Stair training;Functional mobility training;Balance training;Therapeutic exercise;Therapeutic activities;Neuromuscular re-education;Patient/family education    PT Goals (Current goals can be found in the Care Plan section)  Acute Rehab PT Goals Patient Stated Goal: none stated PT Goal Formulation: Patient unable to participate in goal setting Time For Goal Achievement: 12/26/20 Potential to Achieve Goals: Good    Frequency Min 2X/week   Barriers to discharge Inaccessible home environment has ramp/stairs to enter house    Co-evaluation               AM-PAC PT "6 Clicks" Mobility  Outcome Measure Help needed turning from your back to your side while in a flat bed without using bedrails?: A Little Help needed moving from lying on your back to sitting on the side of a flat bed without using bedrails?: A Little Help needed moving to and from a bed to a chair (including a wheelchair)?: A Little Help needed standing up from a chair using your arms (e.g., wheelchair or bedside chair)?: A Little Help needed to walk in hospital room?: A Little Help needed climbing 3-5 steps with a railing? : A Lot 6 Click Score: 17    End of Session Equipment Utilized During  Treatment: Gait belt Activity Tolerance: Patient limited by pain;Treatment limited secondary to medical complications (Comment) Patient left: in chair;with call bell/phone within reach;with chair alarm set Nurse Communication: Mobility status PT Visit Diagnosis: Unsteadiness on feet (R26.81);Muscle weakness (generalized) (M62.81);Pain Pain - Right/Left: Right Pain - part of body: Ankle and joints of foot    Time: 0912-0942 PT Time Calculation (min) (ACUTE ONLY): 30 min   Charges:   PT Evaluation $PT Eval Moderate Complexity: 1 Mod PT Treatments $Gait Training: 8-22 mins       Ramond Dial 12/19/2020, 11:57 AM Mee Hives, PT MS Acute Rehab Dept. Number: [place] and [place]

## 2020-12-19 NOTE — ED Notes (Signed)
Rod Holler, PT at bedside. Bed alarm de-activated at this time.

## 2020-12-19 NOTE — ED Notes (Addendum)
With assistance of Rod Holler, PT and this RN, pt placed in recliner at bedside with bed alarm activated and audible, wheels locked. Call light in reach.

## 2020-12-19 NOTE — ED Notes (Signed)
Patients caregiver at bedside. Caregiver states patient has 24/7 care available and has placement that will be ready at the end of this month at [facility]. Md informed. Md agrees patient can be d/c at this time to caregiver. [place], [place] also notified of patients d/c and agrees to plan of care.

## 2020-12-19 NOTE — ED Notes (Signed)
Pt ambulating in hallway with Rod Holler, PT assisting with nonskid socks in place. Pt appears unsteady on her feet per PT.

## 2020-12-19 NOTE — ED Notes (Signed)
Bed alarm alerted staff to pt attempting to get out of bed without assistance. Upon entering room, pt was observed standing up at foot of bed and was unsteady while standing and had removed purewick. 2 RNs offered ambulation assist and offered toileting. Pt declined. Pt agitated and disoriented. Attempted to reorient patient.  Pt was assisted back to bed and repositioned for comfort.  Bed low and locked with rails up x 2.  Sitter, NT Vet, to bedside for patient safety.

## 2020-12-21 ENCOUNTER — Ambulatory Visit: Payer: Medicare Other | Admitting: *Deleted

## 2020-12-21 DIAGNOSIS — R42 Dizziness and giddiness: Secondary | ICD-10-CM

## 2020-12-21 DIAGNOSIS — R2681 Unsteadiness on feet: Secondary | ICD-10-CM

## 2020-12-21 DIAGNOSIS — W19XXXD Unspecified fall, subsequent encounter: Secondary | ICD-10-CM

## 2020-12-21 DIAGNOSIS — R4189 Other symptoms and signs involving cognitive functions and awareness: Secondary | ICD-10-CM

## 2020-12-21 DIAGNOSIS — R413 Other amnesia: Secondary | ICD-10-CM

## 2020-12-21 DIAGNOSIS — R531 Weakness: Secondary | ICD-10-CM

## 2020-12-21 DIAGNOSIS — M810 Age-related osteoporosis without current pathological fracture: Secondary | ICD-10-CM

## 2020-12-21 DIAGNOSIS — S22060D Wedge compression fracture of T7-T8 vertebra, subsequent encounter for fracture with routine healing: Secondary | ICD-10-CM

## 2020-12-21 NOTE — Chronic Care Management (AMB) (Signed)
Chronic Care Management    Clinical Social Work Note  12/21/2020 Name: Christy Cisneros MRN: [id number] DOB: September 01, 1919   JINNIFER MONTEJANO is a 87 y.o. year old female who is a primary care patient of Einar Pheasant, MD. The CCM team was consulted to assist the patient with chronic disease management and/or care coordination needs related to: [employer], Level of Care Concerns and Caregiver Stress in Patient with Osteoporosis, Osteoarthritis, Compression Fracture of T-8 Vertebra, Weakness, Unsteady Gait, Cognitive Impairment, Dizziness, Blindness, Deafness and History of Falls, would benefit from 24 hour care and supervision in a long-term memory care assisted living facility. .   Engaged with patient by telephone for follow up visit in response to provider referral for social work chronic care management and care coordination services.   Consent to Services:  The patient was given information about Chronic Care Management services, agreed to services, and gave verbal consent prior to initiation of services.  Please see initial visit note for detailed documentation.   Patient agreed to services and consent obtained.   Assessment: Review of patient past medical history, allergies, medications, and health status, including review of relevant consultants reports was performed today as part of a comprehensive evaluation and provision of chronic care management and care coordination services.     SDOH (Social Determinants of Health) assessments and interventions performed:    Advanced Directives Status: Not addressed in this encounter.  CCM Care Plan  Allergies  Allergen Reactions  . Bacitracin Other (See Comments)    Tearing, itching, swelling, redness Other Reaction: tearing, swollen red eyes Tearing, itching, swelling, redness  . Cephalexin Other (See Comments)    HA, nausea, gas, abdominal pain Other reaction(s): Abdominal Pain, Other (See Comments) Other Reaction:  N&V HA, nausea, gas, abdominal pain  . Codeine Other (See Comments)    HA, nausea, gas, abdominal pain Other reaction(s): Abdominal Pain Other Reaction: N&V HA, nausea, gas, abdominal pain  . Avelox [Moxifloxacin Hcl In Nacl] Other (See Comments)    Dizziness, weakness  . Cefdinir Nausea And Vomiting  . Clarithromycin     HA, nausea, gas, abdominal pain  . Hydrocodone-Acetaminophen     REACTION: chest pain, constipation, nausea  . Lansoprazole     REACTION: cramps, nausea, diarrhea  . Polymyxin B     Itching and redness  . Vigamox [Moxifloxacin] Itching    Soreness, irritability, loss of appetite,  Dizziness, diarrhea, muscle weakness  . Brimonidine Itching    Burning, stinging, bloody eye, dry mouth Burning, stinging, bloody eye, dry mouth Burning, stinging, bloody eye, dry mouth  . Erythromycin Rash    HA, nausea, gas, abdominal pain HA, nausea, gas, abdominal pain    Outpatient Encounter Medications as of 12/21/2020  Medication Sig  . omeprazole (PRILOSEC) 20 MG capsule TAKE ONE CAPSULE TWICE A DAY BEFORE MEALS  . tobramycin (TOBREX) 0.3 % ophthalmic solution    No facility-administered encounter medications on file as of 12/21/2020.    Patient Active Problem List   Diagnosis Date Noted  . Leg laceration 11/29/2020  . Compression fracture of T8 vertebra ([place]) 11/20/2020  . Blindness 11/20/2020  . Fall 11/14/2020  . Cognitive impairment 11/14/2020  . Encounter for completion of form with patient 10/10/2020  . Toe pain 06/07/2020  . Weight loss 03/25/2019  . Pain due to onychomycosis of toenails of both feet 03/11/2019  . Depression 05/06/2018  . Abrasion 02/28/2018  . History of CVA (cerebrovascular accident) 03/16/2017  . Memory change 09/04/2016  .  Pneumonia 07/17/2016  . Mild cognitive impairment 05/15/2016  . Dizziness 04/21/2016  . Neck pain 02/11/2016  . Muscle cramps 11/14/2015  . Urine incontinence 08/16/2015  . Hematoma 08/16/2015  . Weakness  07/12/2015  . Headache 06/16/2015  . Unsteady gait 06/01/2015  . GERD (gastroesophageal reflux disease) 08/11/2014  . DYSPNEA 04/17/2009  . HYPERCHOLESTEROLEMIA 04/16/2009  . Obstructive sleep apnea 04/16/2009  . Glaucoma 04/16/2009  . Transient cerebral ischemia 04/16/2009  . BARRETTS ESOPHAGUS 04/16/2009  . OSTEOARTHRITIS 04/16/2009  . Osteoporosis 04/16/2009    Conditions to be addressed/monitored: [employer], Level of Care Concerns and Caregiver Stress in Patient with Osteoporosis, Osteoarthritis, Compression Fracture of T-8 Vertebra, Weakness, Unsteady Gait, Cognitive Impairment, Dizziness, Blindness, Deafness and History of Falls, would benefit from 24 hour care and supervision in a long-term memory care assisted living facility. Limited Social Support, Level of Care Concerns, Mental Health Concerns, Social Isolation, Limited Access to Caregiver, Cognitive Deficits and Memory Deficits.  Care Plan : LCSW Plan of Care  Updates made by Francis Gaines, LCSW since 12/21/2020 12:00 AM    Problem: Planning for Long-Term Care - Dementia.   Priority: High    Goal: Planning for Long-Term Care - Dementia.   Start Date: 12/07/2020  Expected End Date: 01/07/2021  This Visit's Progress: On track  Recent Progress: On track  Priority: High  Note:   Current Barriers:    Patient with Osteoporosis, Osteoarthritis, Compression Fracture of T-8 Vertebra, Weakness, Unsteady Gait, Cognitive Impairment, Dizziness, Blindness, Deafness and History of Falls, would benefit from 24 hour care and supervision in a long-term memory care assisted living facility.  Patient's health has declined and she is no longer able to consistently perform activities of daily living independently.      Acknowledges deficits and inability to care for herself independently.  Unable to self administer medications as prescribed.  Does not adhere to provider recommendations regarding long-term memory care assisted  living facility placement.    Limited access to 24 hour care and supervision through family members, friends, neighbors, church community, healthcare power of attorney, and paid in-home caregiver.   Clinical Goal(s):   Over the next 30 days, patient will be placed into a long-term memory care assisted living facility of her choice, preferably [facility] in [place], [place].  Patient and Niece/Healthcare Power of Oneta Rack, [name] will work with LCSW to coordinate needs for long-term memory care assisted living placement. Interventions:  Patient interviewed and appropriate assessments performed.  Collaboration with Primary Care Physician, Dr. Einar Pheasant, regarding development and update of comprehensive plan of care as evidenced by provider attestation and co-signature.  Inter-disciplinary care team collaboration (see longitudinal plan of care).  Assessed needs and provided education on level of care and facility placement process.  Collaboration with Primary Care Physician, Dr. Einar Pheasant, to request completion of an FL-2 Form.    Obtained verbal consent from patient's Niece/Healthcare Power of Augustina Mood to fax patient's FL-2 Form to all facilities of interest, as well as obtain PASSAR number.   Reviewed and initiated process to obtain PASSAR number.  Encouraged patient and Niece/Healthcare Power of Oneta Rack, [name] to provide LCSW with at least 4 long-term memory care assisted living facilities of interest, in the event that [facility] in [facility] does not have a female bed available.  Assisted Niece/Healthcare Power of Augustina Mood, with understanding facility selection process.  Faxed patient's FL-2 Form to all facilities of interest, [name] in [place], [place], and [facility] in [place], [place].  Provided  patient and Niece/Healthcare Power of [place], [name], with information on all long-term memory care assisted living facilities in [place].  Discussed plans with Niece/Healthcare Power of Augustina Mood, regarding care management follow-up and provided direct contact information for care management team.  Advised patient to continue receiving in-home care services Monday through Sunday, from 10:00am until 3:00pm, to assist with activities of daily living and to provide supervision.    Assisted patient and Niece/Healthcare Power of [place], [name], with obtaining information about health plan benefits.  Encouraged Niece/Healthcare Power of Augustina Mood, to continue to discuss long-term memory care assisted living placement with patient so that she will begin to feel more comfortable with the idea. Patient/Healthcare Power of Attorney Goals:   Patient will continue to receive in-home care services 5 hours per day/7 days per week, through a private pay agency.    Patient and Niece/Healthcare Power of Oneta Rack, [name], will work with LCSW on a weekly basis to secure long-term memory care assisted living facility placement for patient.  LCSW faxed patient's completed and signed FL-2 Form to [facility] in [place], [place], as well as to [facility] in [place], [place], to try and pursue a bed offer for patient.  LCSW also confirmed receipt.  Patient's Niece/Healthcare Power of Oneta Rack, [name] agreed to contact patient's Pension plan to determine whether or not it can be cashed out, then report findings to LCSW.  Follow-Up:  12/30/2020 at 1:00pm.      Follow Up Plan: 12/30/2020 at 1:00pm.      Nat Christen LCSW Licensed Clinical Social Worker [place]  [phone]

## 2020-12-21 NOTE — Patient Instructions (Signed)
Visit Information  PATIENT GOALS: Goals Addressed              This Visit's Progress   .  Planning for Long-Term Care-Dementia. (pt-stated)   On track     Timeframe:  Short-Term Goal Priority:  High Start Date:       12/07/2020                      Expected End Date:    01/07/2021        Follow-Up Date:    12/30/2020 at 1:00pm.     Patient/Healthcare Power of Attorney Goals:   Patient will continue to receive in-home care services 5 hours per day/7 days per week, through a private pay agency.    Patient and Niece/Healthcare Power of Oneta Rack, [name], will work with LCSW on a weekly basis to secure long-term memory care assisted living facility placement for patient.  LCSW faxed patient's completed and signed FL-2 Form to [facility] in [place], [place], as well as to [facility] in [place], [place], to try and pursue a bed offer for patient.  LCSW also confirmed receipt.  Patient's Niece/Healthcare Power of Oneta Rack, [name] agreed to contact patient's Pension plan to determine whether or not it can be cashed out, then report findings to LCSW.        Patient verbalizes understanding of instructions provided today and agrees to view in [place].   Telephone follow up appointment with care management team member scheduled for:  12/30/2020 at 1:00pm.  Nat Christen LCSW Licensed Clinical Social Worker [place]  [phone]

## 2020-12-22 DIAGNOSIS — K2270 Barrett's esophagus without dysplasia: Secondary | ICD-10-CM | POA: Diagnosis not present

## 2020-12-22 DIAGNOSIS — W19XXXD Unspecified fall, subsequent encounter: Secondary | ICD-10-CM | POA: Diagnosis not present

## 2020-12-22 DIAGNOSIS — S22060D Wedge compression fracture of T7-T8 vertebra, subsequent encounter for fracture with routine healing: Secondary | ICD-10-CM | POA: Diagnosis not present

## 2020-12-22 DIAGNOSIS — M8008XD Age-related osteoporosis with current pathological fracture, vertebra(e), subsequent encounter for fracture with routine healing: Secondary | ICD-10-CM | POA: Diagnosis not present

## 2020-12-22 DIAGNOSIS — K219 Gastro-esophageal reflux disease without esophagitis: Secondary | ICD-10-CM | POA: Diagnosis not present

## 2020-12-22 DIAGNOSIS — F0390 Unspecified dementia without behavioral disturbance: Secondary | ICD-10-CM | POA: Diagnosis not present

## 2020-12-24 ENCOUNTER — Telehealth: Payer: Self-pay | Admitting: Internal Medicine

## 2020-12-24 DIAGNOSIS — H10232 Serous conjunctivitis, except viral, left eye: Secondary | ICD-10-CM | POA: Diagnosis not present

## 2020-12-24 DIAGNOSIS — H353132 Nonexudative age-related macular degeneration, bilateral, intermediate dry stage: Secondary | ICD-10-CM | POA: Diagnosis not present

## 2020-12-24 DIAGNOSIS — H401433 Capsular glaucoma with pseudoexfoliation of lens, bilateral, severe stage: Secondary | ICD-10-CM | POA: Diagnosis not present

## 2020-12-24 DIAGNOSIS — H18232 Secondary corneal edema, left eye: Secondary | ICD-10-CM | POA: Diagnosis not present

## 2020-12-24 DIAGNOSIS — H1820 Unspecified corneal edema: Secondary | ICD-10-CM | POA: Diagnosis not present

## 2020-12-24 NOTE — Telephone Encounter (Signed)
L/M for Christy Cisneros to call back

## 2020-12-24 NOTE — Telephone Encounter (Signed)
Lattie Haw called in regards to PT. She was requesting the status of FL2 paperwork that was faxed over back on May 5th. She was wanting to know that if we have received it and completed it can we have it fax to [phone].

## 2020-12-25 ENCOUNTER — Telehealth: Payer: Self-pay | Admitting: Internal Medicine

## 2020-12-25 DIAGNOSIS — S22060D Wedge compression fracture of T7-T8 vertebra, subsequent encounter for fracture with routine healing: Secondary | ICD-10-CM | POA: Diagnosis not present

## 2020-12-25 DIAGNOSIS — M1990 Unspecified osteoarthritis, unspecified site: Secondary | ICD-10-CM | POA: Diagnosis not present

## 2020-12-25 DIAGNOSIS — Z8673 Personal history of transient ischemic attack (TIA), and cerebral infarction without residual deficits: Secondary | ICD-10-CM | POA: Diagnosis not present

## 2020-12-25 DIAGNOSIS — E785 Hyperlipidemia, unspecified: Secondary | ICD-10-CM | POA: Diagnosis not present

## 2020-12-25 DIAGNOSIS — W19XXXD Unspecified fall, subsequent encounter: Secondary | ICD-10-CM | POA: Diagnosis not present

## 2020-12-25 DIAGNOSIS — F32A Depression, unspecified: Secondary | ICD-10-CM | POA: Diagnosis not present

## 2020-12-25 DIAGNOSIS — H409 Unspecified glaucoma: Secondary | ICD-10-CM | POA: Diagnosis not present

## 2020-12-25 DIAGNOSIS — G4730 Sleep apnea, unspecified: Secondary | ICD-10-CM | POA: Diagnosis not present

## 2020-12-25 DIAGNOSIS — K219 Gastro-esophageal reflux disease without esophagitis: Secondary | ICD-10-CM | POA: Diagnosis not present

## 2020-12-25 DIAGNOSIS — Z111 Encounter for screening for respiratory tuberculosis: Secondary | ICD-10-CM | POA: Diagnosis not present

## 2020-12-25 DIAGNOSIS — F0390 Unspecified dementia without behavioral disturbance: Secondary | ICD-10-CM | POA: Diagnosis not present

## 2020-12-25 DIAGNOSIS — M8008XD Age-related osteoporosis with current pathological fracture, vertebra(e), subsequent encounter for fracture with routine healing: Secondary | ICD-10-CM | POA: Diagnosis not present

## 2020-12-25 DIAGNOSIS — K2270 Barrett's esophagus without dysplasia: Secondary | ICD-10-CM | POA: Diagnosis not present

## 2020-12-25 NOTE — Telephone Encounter (Signed)
Spoke with [employer]. Will complete addendum paperwork for signature

## 2020-12-25 NOTE — Telephone Encounter (Signed)
Christy Cisneros from [facility] reached out to see if we ever see the orders for Plan of Care and Disciplined Order that was originally faxed on April 29th then May 20th. She is wanting a callback on status of these faxes as they are needed signed and sent back. She can be reached at [phone].

## 2020-12-29 ENCOUNTER — Ambulatory Visit: Payer: Medicare Other

## 2020-12-29 NOTE — Telephone Encounter (Signed)
Faxed [place] orders to [employer]

## 2020-12-30 ENCOUNTER — Ambulatory Visit (INDEPENDENT_AMBULATORY_CARE_PROVIDER_SITE_OTHER): Payer: Medicare Other | Admitting: *Deleted

## 2020-12-30 DIAGNOSIS — S22060D Wedge compression fracture of T7-T8 vertebra, subsequent encounter for fracture with routine healing: Secondary | ICD-10-CM

## 2020-12-30 DIAGNOSIS — K2270 Barrett's esophagus without dysplasia: Secondary | ICD-10-CM | POA: Diagnosis not present

## 2020-12-30 DIAGNOSIS — R42 Dizziness and giddiness: Secondary | ICD-10-CM

## 2020-12-30 DIAGNOSIS — G3184 Mild cognitive impairment, so stated: Secondary | ICD-10-CM

## 2020-12-30 DIAGNOSIS — R2681 Unsteadiness on feet: Secondary | ICD-10-CM

## 2020-12-30 DIAGNOSIS — K219 Gastro-esophageal reflux disease without esophagitis: Secondary | ICD-10-CM | POA: Diagnosis not present

## 2020-12-30 DIAGNOSIS — H409 Unspecified glaucoma: Secondary | ICD-10-CM | POA: Diagnosis not present

## 2020-12-30 DIAGNOSIS — R413 Other amnesia: Secondary | ICD-10-CM

## 2020-12-30 DIAGNOSIS — F32A Depression, unspecified: Secondary | ICD-10-CM | POA: Diagnosis not present

## 2020-12-30 DIAGNOSIS — W19XXXD Unspecified fall, subsequent encounter: Secondary | ICD-10-CM | POA: Diagnosis not present

## 2020-12-30 DIAGNOSIS — F0390 Unspecified dementia without behavioral disturbance: Secondary | ICD-10-CM | POA: Diagnosis not present

## 2020-12-30 DIAGNOSIS — R531 Weakness: Secondary | ICD-10-CM

## 2020-12-30 DIAGNOSIS — M810 Age-related osteoporosis without current pathological fracture: Secondary | ICD-10-CM | POA: Diagnosis not present

## 2020-12-30 DIAGNOSIS — M8008XD Age-related osteoporosis with current pathological fracture, vertebra(e), subsequent encounter for fracture with routine healing: Secondary | ICD-10-CM | POA: Diagnosis not present

## 2020-12-30 DIAGNOSIS — Z8673 Personal history of transient ischemic attack (TIA), and cerebral infarction without residual deficits: Secondary | ICD-10-CM | POA: Diagnosis not present

## 2020-12-30 DIAGNOSIS — M1990 Unspecified osteoarthritis, unspecified site: Secondary | ICD-10-CM | POA: Diagnosis not present

## 2020-12-30 DIAGNOSIS — R4189 Other symptoms and signs involving cognitive functions and awareness: Secondary | ICD-10-CM

## 2020-12-30 DIAGNOSIS — E785 Hyperlipidemia, unspecified: Secondary | ICD-10-CM | POA: Diagnosis not present

## 2020-12-30 DIAGNOSIS — G4730 Sleep apnea, unspecified: Secondary | ICD-10-CM | POA: Diagnosis not present

## 2020-12-30 NOTE — Telephone Encounter (Signed)
FL2 addendum faxed to [facility]

## 2020-12-30 NOTE — Telephone Encounter (Signed)
POC faxed to [facility]

## 2020-12-30 NOTE — Patient Instructions (Signed)
Visit Information  PATIENT GOALS: Goals Addressed            This Visit's Progress   . COMPLETED: Planning for Long-Term Care-Dementia.   On track    Timeframe:  Short-Term Goal Priority:  High Start Date:       12/07/2020                      Expected End Date:    12/30/2020      Follow-Up Date:   No Follow-Up Required.     Patient/Healthcare Power of Attorney Goals:   Patient has received a bed offer from [facility] and will be transported, via niece/healthcare power of attorney, [name], to the facility for long-term care placement on 12/31/2020.       Patient verbalizes understanding of instructions provided today and agrees to view in [place].   Telephone follow up appointment with care management team member scheduled for:  No Follow-Up Required.  Nat Christen LCSW Licensed Clinical Social Worker [place]  [phone]

## 2020-12-30 NOTE — Chronic Care Management (AMB) (Signed)
Chronic Care Management    Clinical Social Work Note  12/30/2020 Name: Christy Cisneros MRN: [id number] DOB: September 15, 1919  Christy Cisneros is a 87 y.o. year old female who is a primary care patient of Einar Pheasant, MD. The CCM team was consulted to assist the patient with chronic disease management and/or care coordination needs related to: Level of Care Concerns in Patient with Osteoporosis, Osteoarthritis, Weakness, Unsteady Gait, Cognitive Impairment, History of Falls/Risks, Dizziness.   Engaged with patient's niece/healthcare power of attorney, Christy Cisneros by telephone for follow up visit in response to provider referral for social work chronic care management and care coordination services.   Consent to Services:  The patient was given information about Chronic Care Management services, agreed to services, and gave verbal consent prior to initiation of services.  Please see initial visit note for detailed documentation.   Patient agreed to services and consent obtained.   Assessment: Review of patient past medical history, allergies, medications, and health status, including review of relevant consultants reports was performed today as part of a comprehensive evaluation and provision of chronic care management and care coordination services.     SDOH (Social Determinants of Health) assessments and interventions performed:    Advanced Directives Status: Not addressed in this encounter.  CCM Care Plan  Allergies  Allergen Reactions  . Bacitracin Other (See Comments)    Tearing, itching, swelling, redness Other Reaction: tearing, swollen red eyes Tearing, itching, swelling, redness  . Cephalexin Other (See Comments)    HA, nausea, gas, abdominal pain Other reaction(s): Abdominal Pain, Other (See Comments) Other Reaction: N&V HA, nausea, gas, abdominal pain  . Codeine Other (See Comments)    HA, nausea, gas, abdominal pain Other reaction(s): Abdominal Pain Other  Reaction: N&V HA, nausea, gas, abdominal pain  . Avelox [Moxifloxacin Hcl In Nacl] Other (See Comments)    Dizziness, weakness  . Cefdinir Nausea And Vomiting  . Clarithromycin     HA, nausea, gas, abdominal pain  . Hydrocodone-Acetaminophen     REACTION: chest pain, constipation, nausea  . Lansoprazole     REACTION: cramps, nausea, diarrhea  . Polymyxin B     Itching and redness  . Vigamox [Moxifloxacin] Itching    Soreness, irritability, loss of appetite,  Dizziness, diarrhea, muscle weakness  . Brimonidine Itching    Burning, stinging, bloody eye, dry mouth Burning, stinging, bloody eye, dry mouth Burning, stinging, bloody eye, dry mouth  . Erythromycin Rash    HA, nausea, gas, abdominal pain HA, nausea, gas, abdominal pain    Outpatient Encounter Medications as of 12/30/2020  Medication Sig  . omeprazole (PRILOSEC) 20 MG capsule TAKE ONE CAPSULE TWICE A DAY BEFORE MEALS  . tobramycin (TOBREX) 0.3 % ophthalmic solution    No facility-administered encounter medications on file as of 12/30/2020.    Patient Active Problem List   Diagnosis Date Noted  . Leg laceration 11/29/2020  . Compression fracture of T8 vertebra ([place]) 11/20/2020  . Blindness 11/20/2020  . Fall 11/14/2020  . Cognitive impairment 11/14/2020  . Encounter for completion of form with patient 10/10/2020  . Toe pain 06/07/2020  . Weight loss 03/25/2019  . Pain due to onychomycosis of toenails of both feet 03/11/2019  . Depression 05/06/2018  . Abrasion 02/28/2018  . History of CVA (cerebrovascular accident) 03/16/2017  . Memory change 09/04/2016  . Pneumonia 07/17/2016  . Mild cognitive impairment 05/15/2016  . Dizziness 04/21/2016  . Neck pain 02/11/2016  . Muscle cramps 11/14/2015  . Urine  incontinence 08/16/2015  . Hematoma 08/16/2015  . Weakness 07/12/2015  . Headache 06/16/2015  . Unsteady gait 06/01/2015  . GERD (gastroesophageal reflux disease) 08/11/2014  . DYSPNEA 04/17/2009  .  HYPERCHOLESTEROLEMIA 04/16/2009  . Obstructive sleep apnea 04/16/2009  . Glaucoma 04/16/2009  . Transient cerebral ischemia 04/16/2009  . BARRETTS ESOPHAGUS 04/16/2009  . OSTEOARTHRITIS 04/16/2009  . Osteoporosis 04/16/2009    Conditions to be addressed/monitored: Level of Care Concerns in Patient with Osteoporosis, Osteoarthritis, Weakness, Unsteady Gait, Cognitive Impairment, History of Falls/Risks, Dizziness.  Level of Care Concerns, ADL/IADL Limitations, Limited Access to Caregiver, Cognitive Deficits and Memory Deficits.  Care Plan : LCSW Plan of Care  Updates made by Christy Gaines, LCSW since 12/30/2020 12:00 AM    Problem: Planning for Long-Term Care - Dementia. Resolved 12/30/2020  Priority: High    Goal: Planning for Long-Term Care - Dementia. Completed 12/30/2020  Start Date: 12/07/2020  Expected End Date: 12/30/2020  This Visit's Progress: On track  Recent Progress: On track  Priority: High  Note:   Current Barriers:    Patient with Osteoporosis, Osteoarthritis, Compression Fracture of T-8 Vertebra, Weakness, Unsteady Gait, Cognitive Impairment, Dizziness, Blindness, Deafness and History of Falls, would benefit from 24 hour care and supervision in a long-term memory care assisted living facility.  Patient's health has declined and she is no longer able to consistently perform activities of daily living independently.      Acknowledges deficits and inability to care for herself independently.  Unable to self administer medications as prescribed.  Does not adhere to provider recommendations regarding long-term memory care assisted living facility placement.    Limited access to 24 hour care and supervision through family members, friends, neighbors, church community, healthcare power of attorney, and paid in-home caregiver.   Clinical Goal(s):   Over the next 30 days, patient will be placed into a long-term memory care assisted living facility of her choice, preferably  [facility] in [place], [place].  Patient and Niece/Healthcare Power of Christy Cisneros, Christy Cisneros will work with LCSW to coordinate needs for long-term memory care assisted living placement. Interventions:  Patient interviewed and appropriate assessments performed.  Collaboration with Primary Care Physician, Dr. Einar Pheasant, regarding development and update of comprehensive plan of care as evidenced by provider attestation and co-signature.  Inter-disciplinary care team collaboration (see longitudinal plan of care).  Assessed needs and provided education on level of care and facility placement process.  Collaboration with Primary Care Physician, Dr. Einar Pheasant, to request completion of an FL-2 Form.    Obtained verbal consent from patient's Niece/Healthcare Power of Christy Cisneros to fax patient's FL-2 Form to all facilities of interest, as well as obtain PASSAR number.   Reviewed and initiated process to obtain PASSAR number.  Encouraged patient and Niece/Healthcare Power of Christy Cisneros, Christy Cisneros to provide LCSW with at least 4 long-term memory care assisted living facilities of interest, in the event that [facility] does not have a female bed available.  Assisted Niece/Healthcare Power of Christy Cisneros, with understanding facility selection process.  Faxed patient's FL-2 Form to all facilities of interest, [facility] in [place], [place], and [facility] in [place], [place].  Provided patient and Niece/Healthcare Power of [place], Christy Cisneros, with information on all long-term memory care assisted living facilities in [place].  Discussed plans with Niece/Healthcare Power of Christy Cisneros, regarding care management follow-up and  provided direct contact information for care management team.  Advised patient to  continue receiving in-home care services Monday through Sunday, from 10:00am until 3:00pm, to assist with activities of daily living and to provide supervision.    Assisted patient and Niece/Healthcare Power of [place], Christy Cisneros, with obtaining information about health plan benefits.  Encouraged Niece/Healthcare Power of Christy Cisneros, to continue to discuss long-term memory care assisted living placement with patient so that she will begin to feel more comfortable with the idea. Patient/Healthcare Power of Attorney Goals:  . Patient has received a bed offer from [facility] and will be transported, via niece/healthcare power of attorney, Christy Cisneros, to the facility for long-term care placement on 12/31/2020. Follow-Up:  No Follow-Up Required.      Follow Up Plan: No Follow-Up Required.      Christy Christen LCSW Licensed Clinical Social Worker [place]  [phone]

## 2020-12-30 NOTE — Telephone Encounter (Signed)
Do not have POC. Will check with Dr Nicki Reaper to see if she has in her paperwork

## 2021-01-01 ENCOUNTER — Telehealth: Payer: Self-pay | Admitting: Family Medicine

## 2021-01-01 NOTE — Telephone Encounter (Signed)
Called by staff at facility about patient with likely sundowning. Noted yesterday and today.  Recently placed in facility, today is 2nd day.  Staff was able to reassure patient verbally yesterday and again today.  No clear/obvious new sx o/w (no fever, etc).  Since staff was able to manage the situation w/o medicine change, I wouldn't rx new med. If sx continue, then they can update me.  POA and facility staff were on the call.  All in agreement.  Routed to PCP as FYI.

## 2021-01-02 NOTE — Telephone Encounter (Signed)
Reviewed note.  Agree with holding on medication.  Getting adjusted to new facility.

## 2021-01-04 ENCOUNTER — Telehealth: Payer: Self-pay

## 2021-01-04 NOTE — Telephone Encounter (Signed)
Received a call from [facility].

## 2021-01-04 NOTE — Telephone Encounter (Signed)
Called [name] to get update on pt. Patient is experiencing sundowning and is adjusting to living in facility. She is doing ok for the most part but has been up a lot at night. Family member has came to visit twice she becomes anxious after they leave. Patient is doing ok being redirected without adding medication right now but nurse will call back if this becomes more persistent or something changes with her behavior.

## 2021-01-13 DIAGNOSIS — H10232 Serous conjunctivitis, except viral, left eye: Secondary | ICD-10-CM | POA: Diagnosis not present

## 2021-01-13 DIAGNOSIS — H1820 Unspecified corneal edema: Secondary | ICD-10-CM | POA: Diagnosis not present

## 2021-01-13 DIAGNOSIS — H401433 Capsular glaucoma with pseudoexfoliation of lens, bilateral, severe stage: Secondary | ICD-10-CM | POA: Diagnosis not present

## 2021-01-13 DIAGNOSIS — H353132 Nonexudative age-related macular degeneration, bilateral, intermediate dry stage: Secondary | ICD-10-CM | POA: Diagnosis not present

## 2021-01-13 DIAGNOSIS — H18232 Secondary corneal edema, left eye: Secondary | ICD-10-CM | POA: Diagnosis not present

## 2021-01-26 ENCOUNTER — Ambulatory Visit (INDEPENDENT_AMBULATORY_CARE_PROVIDER_SITE_OTHER): Payer: Medicare Other | Admitting: Internal Medicine

## 2021-01-26 ENCOUNTER — Telehealth: Payer: Self-pay | Admitting: Internal Medicine

## 2021-01-26 ENCOUNTER — Other Ambulatory Visit: Payer: Self-pay

## 2021-01-26 ENCOUNTER — Encounter: Payer: Self-pay | Admitting: Internal Medicine

## 2021-01-26 VITALS — BP 126/70 | HR 78 | Temp 97.2°F | Resp 16 | Ht 60.0 in | Wt 101.6 lb

## 2021-01-26 DIAGNOSIS — R600 Localized edema: Secondary | ICD-10-CM | POA: Diagnosis not present

## 2021-01-26 DIAGNOSIS — R413 Other amnesia: Secondary | ICD-10-CM | POA: Diagnosis not present

## 2021-01-26 DIAGNOSIS — K22719 Barrett's esophagus with dysplasia, unspecified: Secondary | ICD-10-CM

## 2021-01-26 DIAGNOSIS — F419 Anxiety disorder, unspecified: Secondary | ICD-10-CM | POA: Diagnosis not present

## 2021-01-26 DIAGNOSIS — E7800 Pure hypercholesterolemia, unspecified: Secondary | ICD-10-CM | POA: Diagnosis not present

## 2021-01-26 DIAGNOSIS — F32A Depression, unspecified: Secondary | ICD-10-CM | POA: Diagnosis not present

## 2021-01-26 NOTE — Progress Notes (Signed)
Patient ID: Christy Cisneros, female   DOB: 1920-06-12, 87 y.o.   MRN: [id number]   Subjective:    Patient ID: Christy Cisneros, female    DOB: 1920-01-28, 87 y.o.   MRN: [id number]  HPI This visit occurred during the SARS-CoV-2 public health emergency.  Safety protocols were in place, including screening questions prior to the visit, additional usage of staff PPE, and extensive cleaning of exam room while observing appropriate contact time as indicated for disinfecting solutions.   Patient here for work in appt.  Work in to discuss lower extremity swelling.  Recently moved to [facility] care.  She is accompanied today by the activities director of the facility.  History obtained from both of them.  She has been up on her feet a lot.  Does a lot of walking around the facility.  Does not Cisneros a lot.  Has noticed that she has been at the facility that her lower extremities have been more swollen.  More localized from the knee down.  Has venous stasis changes and chronic skin changes.  No acute redness.  She denies any shortness of breath or chest pain.  She is eating.  No nausea or vomiting.  No bowel change reported.  The activities director does report that she gets anxious.  Likes for someone to be with her.  Had some issues when she moved DN of sundowning.  Appears now that anxiety is more of an issue.  She has always lived by herself.  Been very independent.  This transition is a change for her.  Question whether or not medication would help to calm her down.   Past Medical History:  Diagnosis Date   Barrett's esophagus with esophagitis    Chronic bronchitis (HCC)    Depression    GERD (gastroesophageal reflux disease)    Glaucoma    Hypercholesteremia    Osteoarthritis    Osteoporosis    Sleep apnea    has CPAP   TIA (transient ischemic attack)    Past Surgical History:  Procedure Laterality Date   BUNIONECTOMY     left   ROTATOR CUFF REPAIR     right   Family History   Problem Relation Age of Onset   Breast cancer Sister    Heart disease Mother    Alcohol abuse Father    Social History   Socioeconomic History   Marital status: Single    Spouse name: Not on file   Number of children: 0   Years of education: 8   Highest education level: 12th grade  Occupational History   Occupation: Retired  Tobacco Use   Smoking status: Never   Smokeless tobacco: Never  Vaping Use   Vaping Use: Never used  Substance and Sexual Activity   Alcohol use: Yes    Alcohol/week: 0.0 standard drinks    Comment: wine/brandy occasional   Drug use: No   Sexual activity: Never  Other Topics Concern   Not on file  Social History Narrative   Lives alone no kids   Social Determinants of Health   Financial Resource Strain: Low Risk    Difficulty of Paying Living Expenses: Not hard at all  Food Insecurity: No Food Insecurity   Worried About Charity fundraiser in the Last Year: Never true   [place] in the Last Year: Never true  Transportation Needs: No Transportation Needs   Lack of Transportation (Medical): No   Lack of Transportation (Non-Medical): No  Physical Activity: Sufficiently Active   Days of Exercise per Week: 7 days   Minutes of Exercise per Session: 30 min  Stress: No Stress Concern Present   Feeling of Stress : Not at all  Social Connections: Moderately Integrated   Frequency of Communication with Friends and Family: More than three times a week   Frequency of Social Gatherings with Friends and Family: More than three times a week   Attends Religious Services: 1 to 4 times per year   Active Member of [employer] or Organizations: Yes   Attends Archivist Meetings: 1 to 4 times per year   Marital Status: Never married    Review of Systems  Constitutional:  Negative for appetite change and unexpected weight change.  HENT:  Negative for congestion and sinus pressure.   Respiratory:  Negative for cough, chest tightness and shortness  of breath.   Cardiovascular:  Positive for leg swelling. Negative for chest pain and palpitations.  Gastrointestinal:  Negative for abdominal pain, diarrhea, nausea and vomiting.  Genitourinary:  Negative for difficulty urinating and dysuria.  Musculoskeletal:  Negative for joint swelling and myalgias.  Skin:  Negative for color change and rash.  Neurological:  Negative for dizziness, light-headedness and headaches.  Psychiatric/Behavioral:         Increased anxiety as outlined.  Needs reassurance.        Objective:    Physical Exam Vitals reviewed.  Constitutional:      General: She is not in acute distress.    Appearance: Normal appearance.  HENT:     Head: Normocephalic and atraumatic.     Right Ear: External ear normal.     Left Ear: External ear normal.  Eyes:     General: No scleral icterus.       Right eye: No discharge.        Left eye: No discharge.     Conjunctiva/sclera: Conjunctivae normal.  Neck:     Thyroid: No thyromegaly.  Cardiovascular:     Rate and Rhythm: Normal rate and regular rhythm.  Pulmonary:     Effort: No respiratory distress.     Breath sounds: Normal breath sounds. No wheezing.  Abdominal:     General: Bowel sounds are normal.     Palpations: Abdomen is soft.     Tenderness: There is no abdominal tenderness.  Musculoskeletal:     Cervical back: Neck supple. No tenderness.     Comments: Increased lower extremity swelling - pedal and below the knee swelling.    Lymphadenopathy:     Cervical: No cervical adenopathy.  Skin:    Findings: No erythema or rash.     Comments: Chronic skin changes - lower extremities.    Neurological:     Mental Status: She is alert.  Psychiatric:        Mood and Affect: Mood normal.        Behavior: Behavior normal.    BP 126/70   Pulse 78   Temp (!) 97.2 F (36.2 C)   Resp 16   Ht 5' (1.524 m)   Wt 101 lb 9.6 oz (46.1 kg)   SpO2 97%   BMI 19.84 kg/m  Wt Readings from Last 3 Encounters:  01/26/21  101 lb 9.6 oz (46.1 kg)  12/18/20 100 lb 1.4 oz (45.4 kg)  11/25/20 100 lb (45.4 kg)    Outpatient Encounter Medications as of 01/26/2021  Medication Sig   omeprazole (PRILOSEC) 20 MG capsule TAKE ONE CAPSULE TWICE  A DAY BEFORE MEALS   tobramycin (TOBREX) 0.3 % ophthalmic solution    No facility-administered encounter medications on file as of 01/26/2021.     Lab Results  Component Value Date   WBC 7.1 01/26/2021   HGB 12.5 01/26/2021   HCT 37.7 01/26/2021   PLT 280.0 01/26/2021   GLUCOSE 89 01/26/2021   CHOL 243 (H) 03/21/2019   TRIG 100.0 03/21/2019   HDL 81.40 03/21/2019   LDLCALC 142 (H) 03/21/2019   ALT 12 01/26/2021   AST 18 01/26/2021   NA 142 01/26/2021   K 4.0 01/26/2021   CL 106 01/26/2021   CREATININE 1.14 01/26/2021   BUN 26 (H) 01/26/2021   CO2 28 01/26/2021   TSH 1.40 01/26/2021   INR 0.96 03/16/2017   HGBA1C 5.4 03/17/2017       Assessment & Plan:   Problem List Items Addressed This Visit     Anxiety    Discussed anxiety and stress as outlined.  Consider medication to help control - remeron, etc.  Follow.        BARRETTS ESOPHAGUS    No upper symptoms reported.         Depression    Previous concern regarding mild depression.  Recent transition.  Increased anxiety with the move.  Transition - new facility.  Check routine labs.  May need something to help with the anxiety, etc.         Edema, lower extremity    Increased pedal and lower extremity swelling as outlined.  No increased erythema.  Discussed leg elevation.  She is up on her feet a lot.  Walks a lot.  Order placed for compression hose.  Check routine labs - including metabolic panel and liver function tests.  (confirm normal albumin, etc).  Consider a couple of days - lasix.  Follow.         Relevant Orders   CBC with Differential/Platelet (Completed)   Hepatic function panel (Completed)   TSH (Completed)   Basic metabolic panel (Completed)   HYPERCHOLESTEROLEMIA - Primary     Follow lipid panel.        Memory change    Has seen neurology.  Transitioned recently to [facility] memory care.  Follow.           Einar Pheasant, MD

## 2021-01-26 NOTE — Telephone Encounter (Signed)
Letter typed for compression hose.

## 2021-01-27 LAB — BASIC METABOLIC PANEL
BUN: 26 mg/dL — ABNORMAL HIGH (ref 6–23)
CO2: 28 mEq/L (ref 19–32)
Calcium: 9.2 mg/dL (ref 8.4–10.5)
Chloride: 106 mEq/L (ref 96–112)
Creatinine, Ser: 1.14 mg/dL (ref 0.40–1.20)
GFR: 40 mL/min — ABNORMAL LOW (ref >60.00)
Glucose, Bld: 89 mg/dL (ref 70–99)
Potassium: 4 mEq/L (ref 3.5–5.1)
Sodium: 142 mEq/L (ref 135–145)

## 2021-01-27 LAB — CBC WITH DIFFERENTIAL/PLATELET
Basophils Absolute: 0.1 10*3/uL (ref 0.0–0.1)
Basophils Relative: 1.2 % (ref 0.0–3.0)
Eosinophils Absolute: 0.2 10*3/uL (ref 0.0–0.7)
Eosinophils Relative: 2.1 % (ref 0.0–5.0)
HCT: 37.7 % (ref 36.0–46.0)
Hemoglobin: 12.5 g/dL (ref 12.0–15.0)
Lymphocytes Relative: 15.3 % (ref 12.0–46.0)
Lymphs Abs: 1.1 10*3/uL (ref 0.7–4.0)
MCHC: 33.3 g/dL (ref 30.0–36.0)
MCV: 92.1 fl (ref 78.0–100.0)
Monocytes Absolute: 0.7 10*3/uL (ref 0.1–1.0)
Monocytes Relative: 9.9 % (ref 3.0–12.0)
Neutro Abs: 5.1 10*3/uL (ref 1.4–7.7)
Neutrophils Relative %: 71.5 % (ref 43.0–77.0)
Platelets: 280 10*3/uL (ref 150.0–400.0)
RBC: 4.09 Mil/uL (ref 3.87–5.11)
RDW: 14.6 % (ref 11.5–15.5)
WBC: 7.1 10*3/uL (ref 4.0–10.5)

## 2021-01-27 LAB — HEPATIC FUNCTION PANEL
ALT: 12 U/L (ref 0–35)
AST: 18 U/L (ref 0–37)
Albumin: 3.9 g/dL (ref 3.5–5.2)
Alkaline Phosphatase: 58 U/L (ref 39–117)
Bilirubin, Direct: 0.1 mg/dL (ref 0.0–0.3)
Total Bilirubin: 0.4 mg/dL (ref 0.2–1.2)
Total Protein: 6.6 g/dL (ref 6.0–8.3)

## 2021-01-27 LAB — TSH: TSH: 1.4 u[IU]/mL (ref 0.35–4.50)

## 2021-01-28 ENCOUNTER — Ambulatory Visit: Payer: Medicare Other | Admitting: Internal Medicine

## 2021-01-28 ENCOUNTER — Encounter: Payer: Self-pay | Admitting: *Deleted

## 2021-01-28 ENCOUNTER — Telehealth: Payer: Self-pay | Admitting: Internal Medicine

## 2021-01-28 DIAGNOSIS — F419 Anxiety disorder, unspecified: Secondary | ICD-10-CM | POA: Insufficient documentation

## 2021-01-28 NOTE — Assessment & Plan Note (Signed)
Previous concern regarding mild depression.  Recent transition.  Increased anxiety with the move.  Transition - new facility.  Check routine labs.  May need something to help with the anxiety, etc.

## 2021-01-28 NOTE — Telephone Encounter (Signed)
At her appt, there was mention of needing something for anxiety.  We can try remeron.  I would recommend starting a really low dose to see how she tolerates.  Can try cutting 7.5mg  tablet and given 1/2 tablet q hs and see how she tolerates.  Let me know if problems.  Monitor closely when first starting.

## 2021-01-28 NOTE — Assessment & Plan Note (Signed)
No upper symptoms reported.   

## 2021-01-28 NOTE — Assessment & Plan Note (Signed)
Increased pedal and lower extremity swelling as outlined.  No increased erythema.  Discussed leg elevation.  She is up on her feet a lot.  Walks a lot.  Order placed for compression hose.  Check routine labs - including metabolic panel and liver function tests.  (confirm normal albumin, etc).  Consider a couple of days - lasix.  Follow.

## 2021-01-28 NOTE — Assessment & Plan Note (Signed)
Follow lipid panel.   

## 2021-01-28 NOTE — Assessment & Plan Note (Signed)
Has seen neurology.  Transitioned recently to [facility] memory care.  Follow.

## 2021-01-28 NOTE — Assessment & Plan Note (Signed)
Discussed anxiety and stress as outlined.  Consider medication to help control - remeron, etc.  Follow.

## 2021-01-29 DIAGNOSIS — K2270 Barrett's esophagus without dysplasia: Secondary | ICD-10-CM | POA: Diagnosis not present

## 2021-01-29 DIAGNOSIS — H547 Unspecified visual loss: Secondary | ICD-10-CM | POA: Diagnosis not present

## 2021-01-29 DIAGNOSIS — H409 Unspecified glaucoma: Secondary | ICD-10-CM | POA: Diagnosis not present

## 2021-01-29 DIAGNOSIS — S81801D Unspecified open wound, right lower leg, subsequent encounter: Secondary | ICD-10-CM | POA: Diagnosis not present

## 2021-01-29 DIAGNOSIS — Z8673 Personal history of transient ischemic attack (TIA), and cerebral infarction without residual deficits: Secondary | ICD-10-CM | POA: Diagnosis not present

## 2021-01-29 DIAGNOSIS — G4733 Obstructive sleep apnea (adult) (pediatric): Secondary | ICD-10-CM | POA: Diagnosis not present

## 2021-01-29 DIAGNOSIS — M8088XD Other osteoporosis with current pathological fracture, vertebra(e), subsequent encounter for fracture with routine healing: Secondary | ICD-10-CM | POA: Diagnosis not present

## 2021-01-29 DIAGNOSIS — M1990 Unspecified osteoarthritis, unspecified site: Secondary | ICD-10-CM | POA: Diagnosis not present

## 2021-02-02 NOTE — Telephone Encounter (Signed)
Per written orders, this has already been addressed.

## 2021-02-03 DIAGNOSIS — H18232 Secondary corneal edema, left eye: Secondary | ICD-10-CM | POA: Diagnosis not present

## 2021-02-03 DIAGNOSIS — H401433 Capsular glaucoma with pseudoexfoliation of lens, bilateral, severe stage: Secondary | ICD-10-CM | POA: Diagnosis not present

## 2021-02-03 DIAGNOSIS — H6123 Impacted cerumen, bilateral: Secondary | ICD-10-CM | POA: Diagnosis not present

## 2021-02-03 DIAGNOSIS — H353132 Nonexudative age-related macular degeneration, bilateral, intermediate dry stage: Secondary | ICD-10-CM | POA: Diagnosis not present

## 2021-02-03 DIAGNOSIS — H903 Sensorineural hearing loss, bilateral: Secondary | ICD-10-CM | POA: Diagnosis not present

## 2021-02-03 DIAGNOSIS — H1820 Unspecified corneal edema: Secondary | ICD-10-CM | POA: Diagnosis not present

## 2021-02-03 DIAGNOSIS — H10233 Serous conjunctivitis, except viral, bilateral: Secondary | ICD-10-CM | POA: Diagnosis not present

## 2021-02-05 DIAGNOSIS — H409 Unspecified glaucoma: Secondary | ICD-10-CM | POA: Diagnosis not present

## 2021-02-05 DIAGNOSIS — S81801D Unspecified open wound, right lower leg, subsequent encounter: Secondary | ICD-10-CM | POA: Diagnosis not present

## 2021-02-05 DIAGNOSIS — G4733 Obstructive sleep apnea (adult) (pediatric): Secondary | ICD-10-CM | POA: Diagnosis not present

## 2021-02-05 DIAGNOSIS — K2270 Barrett's esophagus without dysplasia: Secondary | ICD-10-CM | POA: Diagnosis not present

## 2021-02-05 DIAGNOSIS — M8088XD Other osteoporosis with current pathological fracture, vertebra(e), subsequent encounter for fracture with routine healing: Secondary | ICD-10-CM | POA: Diagnosis not present

## 2021-02-05 DIAGNOSIS — M1990 Unspecified osteoarthritis, unspecified site: Secondary | ICD-10-CM | POA: Diagnosis not present

## 2021-02-08 ENCOUNTER — Telehealth: Payer: Self-pay | Admitting: Internal Medicine

## 2021-02-08 NOTE — Telephone Encounter (Signed)
Lyman Speller a Home Health Nurse called to advise that the PT is needing to be seen either via evist or face to face with Dr.Scott in order to continue the home health orders. She states that it was previously signed before but it requires a visit. Its for the right calf that has a laceration and there must be documentation stating that it is a laceration so insurance can be reimbursed. She can be reach at [phone] for followup.

## 2021-02-09 NOTE — Telephone Encounter (Signed)
If needs to be seen for orders, ok to schedule appt

## 2021-02-10 DIAGNOSIS — K2270 Barrett's esophagus without dysplasia: Secondary | ICD-10-CM | POA: Diagnosis not present

## 2021-02-10 DIAGNOSIS — H409 Unspecified glaucoma: Secondary | ICD-10-CM | POA: Diagnosis not present

## 2021-02-10 DIAGNOSIS — S81801D Unspecified open wound, right lower leg, subsequent encounter: Secondary | ICD-10-CM | POA: Diagnosis not present

## 2021-02-10 DIAGNOSIS — G4733 Obstructive sleep apnea (adult) (pediatric): Secondary | ICD-10-CM | POA: Diagnosis not present

## 2021-02-10 DIAGNOSIS — M1990 Unspecified osteoarthritis, unspecified site: Secondary | ICD-10-CM | POA: Diagnosis not present

## 2021-02-10 DIAGNOSIS — M8088XD Other osteoporosis with current pathological fracture, vertebra(e), subsequent encounter for fracture with routine healing: Secondary | ICD-10-CM | POA: Diagnosis not present

## 2021-02-10 NOTE — Telephone Encounter (Signed)
Called and left a message to call back and schedule an appointment with Dr. Nicki Reaper

## 2021-02-11 ENCOUNTER — Telehealth: Payer: Self-pay | Admitting: Internal Medicine

## 2021-02-11 NOTE — Telephone Encounter (Signed)
Per note, waiting for recheck of blood pressure.  Have not heard back yet.  If do not hear, please call for recheck of blood pressure.

## 2021-02-11 NOTE — Telephone Encounter (Signed)
Called and confirmed that patient has an appointment for 02/18/21 at 11:30 am.

## 2021-02-11 NOTE — Telephone Encounter (Signed)
Received a paper notification that pt fell (witnessed fall) today.  Has small skin tear on her right elbow.  Reported no other signs of injury.  Please call and confirm she is doing ok.  Confirm no head injury.  Let me know if she needs anything.

## 2021-02-11 NOTE — Telephone Encounter (Signed)
Spoke to [place] she stated that patient has been up since fall walking around, has voiced no complaints, No bruising noted but has small skin tear to right elbow, no drainage at tear , nor redness noted by Colletta [place]. I did ask they recheck BP and return call back to office with reading since elevated at time of fall, 179/95 pulse 79 at the time of fall (likely due to fall) but advised we needed a recheck to verify.

## 2021-02-12 NOTE — Telephone Encounter (Signed)
Updated BP was 120/78 this morning. Confirmed patient is doing ok.

## 2021-02-12 NOTE — Telephone Encounter (Signed)
Called [facility]. Nurse was unavailable to speak with me at this time. They are supposed to call back with updated vital signs.

## 2021-02-13 DIAGNOSIS — H409 Unspecified glaucoma: Secondary | ICD-10-CM | POA: Diagnosis not present

## 2021-02-13 DIAGNOSIS — S81801D Unspecified open wound, right lower leg, subsequent encounter: Secondary | ICD-10-CM | POA: Diagnosis not present

## 2021-02-13 DIAGNOSIS — M8088XD Other osteoporosis with current pathological fracture, vertebra(e), subsequent encounter for fracture with routine healing: Secondary | ICD-10-CM | POA: Diagnosis not present

## 2021-02-13 DIAGNOSIS — K2270 Barrett's esophagus without dysplasia: Secondary | ICD-10-CM | POA: Diagnosis not present

## 2021-02-13 DIAGNOSIS — M1990 Unspecified osteoarthritis, unspecified site: Secondary | ICD-10-CM | POA: Diagnosis not present

## 2021-02-13 DIAGNOSIS — G4733 Obstructive sleep apnea (adult) (pediatric): Secondary | ICD-10-CM | POA: Diagnosis not present

## 2021-02-16 DIAGNOSIS — G4733 Obstructive sleep apnea (adult) (pediatric): Secondary | ICD-10-CM | POA: Diagnosis not present

## 2021-02-16 DIAGNOSIS — H409 Unspecified glaucoma: Secondary | ICD-10-CM | POA: Diagnosis not present

## 2021-02-16 DIAGNOSIS — M8088XD Other osteoporosis with current pathological fracture, vertebra(e), subsequent encounter for fracture with routine healing: Secondary | ICD-10-CM | POA: Diagnosis not present

## 2021-02-16 DIAGNOSIS — K2270 Barrett's esophagus without dysplasia: Secondary | ICD-10-CM | POA: Diagnosis not present

## 2021-02-16 DIAGNOSIS — M1990 Unspecified osteoarthritis, unspecified site: Secondary | ICD-10-CM | POA: Diagnosis not present

## 2021-02-16 DIAGNOSIS — S81801D Unspecified open wound, right lower leg, subsequent encounter: Secondary | ICD-10-CM | POA: Diagnosis not present

## 2021-02-17 DIAGNOSIS — H353132 Nonexudative age-related macular degeneration, bilateral, intermediate dry stage: Secondary | ICD-10-CM | POA: Diagnosis not present

## 2021-02-17 DIAGNOSIS — H18232 Secondary corneal edema, left eye: Secondary | ICD-10-CM | POA: Diagnosis not present

## 2021-02-17 DIAGNOSIS — H10233 Serous conjunctivitis, except viral, bilateral: Secondary | ICD-10-CM | POA: Diagnosis not present

## 2021-02-17 DIAGNOSIS — H1820 Unspecified corneal edema: Secondary | ICD-10-CM | POA: Diagnosis not present

## 2021-02-17 DIAGNOSIS — H401433 Capsular glaucoma with pseudoexfoliation of lens, bilateral, severe stage: Secondary | ICD-10-CM | POA: Diagnosis not present

## 2021-02-18 ENCOUNTER — Encounter: Payer: Self-pay | Admitting: Internal Medicine

## 2021-02-18 ENCOUNTER — Ambulatory Visit (INDEPENDENT_AMBULATORY_CARE_PROVIDER_SITE_OTHER): Payer: Medicare Other | Admitting: Internal Medicine

## 2021-02-18 ENCOUNTER — Other Ambulatory Visit: Payer: Self-pay

## 2021-02-18 DIAGNOSIS — G3184 Mild cognitive impairment, so stated: Secondary | ICD-10-CM | POA: Diagnosis not present

## 2021-02-18 DIAGNOSIS — R600 Localized edema: Secondary | ICD-10-CM | POA: Diagnosis not present

## 2021-02-18 DIAGNOSIS — S81819D Laceration without foreign body, unspecified lower leg, subsequent encounter: Secondary | ICD-10-CM

## 2021-02-18 DIAGNOSIS — R634 Abnormal weight loss: Secondary | ICD-10-CM

## 2021-02-18 DIAGNOSIS — E7800 Pure hypercholesterolemia, unspecified: Secondary | ICD-10-CM | POA: Diagnosis not present

## 2021-02-18 DIAGNOSIS — M810 Age-related osteoporosis without current pathological fracture: Secondary | ICD-10-CM | POA: Diagnosis not present

## 2021-02-18 DIAGNOSIS — W19XXXD Unspecified fall, subsequent encounter: Secondary | ICD-10-CM | POA: Diagnosis not present

## 2021-02-18 DIAGNOSIS — T148XXA Other injury of unspecified body region, initial encounter: Secondary | ICD-10-CM

## 2021-02-18 NOTE — Progress Notes (Signed)
Called [name] was advised patient dressing was changed 6/19/022, and that was due to be changed again on 01/20/21, Requested wound report to be faxed to [place] [facility]. Made front aware.

## 2021-02-18 NOTE — Progress Notes (Signed)
Patient ID: Christy Cisneros, female   DOB: May 05, 1920, 87 y.o.   MRN: [id number]   Subjective:    Patient ID: Christy Cisneros, female    DOB: 1919-09-02, 87 y.o.   MRN: [id number]  HPI This visit occurred during the SARS-CoV-2 public health emergency.  Safety protocols were in place, including screening questions prior to the visit, additional usage of staff PPE, and extensive cleaning of exam room while observing appropriate contact time as indicated for disinfecting solutions.   Patient here for work in appt.  Work in - f/u recent fall.  Had a fall 02/11/21.  No head injury. Had a small skin tear on her right elbow.  Since the fall, she has been walking around.  She is accompanied by her niece Pamala Hurry today.  History obtained from both of them.  No chest pain.  Breathing stable.  No increased cough or congestion.  No acid reflux.  No abdominal pain reported.  Bowels moving.  Swelling is better.  Legs are being wrapped daily.  Bandage in place - lower leg.  Wound care - following and planning to come out tomorrow to change dressing.    Past Medical History:  Diagnosis Date   Barrett's esophagus with esophagitis    Chronic bronchitis (HCC)    Depression    GERD (gastroesophageal reflux disease)    Glaucoma    Hypercholesteremia    Osteoarthritis    Osteoporosis    Sleep apnea    has CPAP   TIA (transient ischemic attack)    Past Surgical History:  Procedure Laterality Date   BUNIONECTOMY     left   ROTATOR CUFF REPAIR     right   Family History  Problem Relation Age of Onset   Breast cancer Sister    Heart disease Mother    Alcohol abuse Father    Social History   Socioeconomic History   Marital status: Single    Spouse name: Not on file   Number of children: 0   Years of education: 71   Highest education level: 12th grade  Occupational History   Occupation: Retired  Tobacco Use   Smoking status: Never   Smokeless tobacco: Never  Vaping Use   Vaping Use: Never  used  Substance and Sexual Activity   Alcohol use: Yes    Alcohol/week: 0.0 standard drinks    Comment: wine/brandy occasional   Drug use: No   Sexual activity: Never  Other Topics Concern   Not on file  Social History Narrative   Lives alone no kids   Social Determinants of Health   Financial Resource Strain: Low Risk    Difficulty of Paying Living Expenses: Not hard at all  Food Insecurity: No Food Insecurity   Worried About Charity fundraiser in the Last Year: Never true   [place] in the Last Year: Never true  Transportation Needs: No Transportation Needs   Lack of Transportation (Medical): No   Lack of Transportation (Non-Medical): No  Physical Activity: Sufficiently Active   Days of Exercise per Week: 7 days   Minutes of Exercise per Session: 30 min  Stress: No Stress Concern Present   Feeling of Stress : Not at all  Social Connections: Moderately Integrated   Frequency of Communication with Friends and Family: More than three times a week   Frequency of Social Gatherings with Friends and Family: More than three times a week   Attends Religious Services: 1 to 4 times  per year   Active Member of Clubs or Organizations: Yes   Attends Archivist Meetings: 1 to 4 times per year   Marital Status: Never married    Review of Systems  Constitutional:  Negative for appetite change and unexpected weight change.  HENT:  Negative for congestion and sinus pressure.   Respiratory:  Negative for cough, chest tightness and shortness of breath.   Cardiovascular:  Negative for chest pain and palpitations.       Leg swelling improved.    Gastrointestinal:  Negative for abdominal pain, diarrhea, nausea and vomiting.  Genitourinary:  Negative for difficulty urinating and dysuria.  Musculoskeletal:  Negative for joint swelling and myalgias.  Skin:  Negative for color change and rash.  Neurological:  Negative for dizziness, light-headedness and headaches.   Psychiatric/Behavioral:  Negative for agitation and dysphoric mood.       Objective:    Physical Exam Vitals reviewed.  Constitutional:      General: She is not in acute distress.    Appearance: Normal appearance.  HENT:     Head: Normocephalic and atraumatic.     Right Ear: External ear normal.     Left Ear: External ear normal.  Eyes:     General: No scleral icterus.       Right eye: No discharge.        Left eye: No discharge.     Conjunctiva/sclera: Conjunctivae normal.  Neck:     Thyroid: No thyromegaly.  Cardiovascular:     Rate and Rhythm: Normal rate and regular rhythm.  Pulmonary:     Effort: No respiratory distress.     Breath sounds: Normal breath sounds. No wheezing.  Abdominal:     General: Bowel sounds are normal.     Palpations: Abdomen is soft.     Tenderness: There is no abdominal tenderness.  Musculoskeletal:        General: No tenderness.     Cervical back: Neck supple. No tenderness.     Comments: Pedal and lower extremity swelling - improved from recent check.  Bandage over right lower leg/calf.  No surrounding erythema.  Being followed by wound - home health at [facility]    Lymphadenopathy:     Cervical: No cervical adenopathy.  Skin:    Findings: No erythema or rash.     Comments: Healing abrasion - elbow.  No surrounding erythema.   Neurological:     Mental Status: She is alert.  Psychiatric:        Mood and Affect: Mood normal.        Behavior: Behavior normal.    BP 122/70   Pulse 90   Temp 97.9 F (36.6 C)   Resp 16   Ht 5' (1.524 m)   Wt 102 lb (46.3 kg)   SpO2 98%   BMI 19.92 kg/m  Wt Readings from Last 3 Encounters:  02/18/21 102 lb (46.3 kg)  01/26/21 101 lb 9.6 oz (46.1 kg)  12/18/20 100 lb 1.4 oz (45.4 kg)    Outpatient Encounter Medications as of 02/18/2021  Medication Sig   mirtazapine (REMERON) 7.5 MG tablet Take 0.5 tablets by mouth daily.   omeprazole (PRILOSEC) 20 MG capsule TAKE ONE CAPSULE TWICE A DAY BEFORE  MEALS   tobramycin (TOBREX) 0.3 % ophthalmic solution    [DISCONTINUED] furosemide (LASIX) 20 MG tablet Take 20 mg by mouth daily.   No facility-administered encounter medications on file as of 02/18/2021.     Lab Results  Component Value Date   WBC 7.1 01/26/2021   HGB 12.5 01/26/2021   HCT 37.7 01/26/2021   PLT 280.0 01/26/2021   GLUCOSE 89 01/26/2021   CHOL 243 (H) 03/21/2019   TRIG 100.0 03/21/2019   HDL 81.40 03/21/2019   LDLCALC 142 (H) 03/21/2019   ALT 12 01/26/2021   AST 18 01/26/2021   NA 142 01/26/2021   K 4.0 01/26/2021   CL 106 01/26/2021   CREATININE 1.14 01/26/2021   BUN 26 (H) 01/26/2021   CO2 28 01/26/2021   TSH 1.40 01/26/2021   INR 0.96 03/16/2017   HGBA1C 5.4 03/17/2017       Assessment & Plan:   Problem List Items Addressed This Visit     Abrasion    Elbow abrasion - recent fall.  Healing.  No surrounding erythema.  Follow.        Edema, lower extremity    Swelling much improved.  Should be off lasix now.  Follow.  Continue wraps.        Fall    Recent fall.  No head injury.  Monitor.         HYPERCHOLESTEROLEMIA    Follow lipid panel.        Leg laceration    Being followed by [facility] - wound care. Coming out tomorrow to change dressing.  No surrounding erythema.  Swelling improved.         Mild cognitive impairment    Has seen neurology.  Follow.        Osteoporosis    Has seen endocrinology.  Received prolia 11/03/20.         Weight loss    Weight stable from last check.  Eating.  No nausea or vomiting.           Einar Pheasant, MD

## 2021-02-19 DIAGNOSIS — G4733 Obstructive sleep apnea (adult) (pediatric): Secondary | ICD-10-CM | POA: Diagnosis not present

## 2021-02-19 DIAGNOSIS — M1990 Unspecified osteoarthritis, unspecified site: Secondary | ICD-10-CM | POA: Diagnosis not present

## 2021-02-19 DIAGNOSIS — S81801D Unspecified open wound, right lower leg, subsequent encounter: Secondary | ICD-10-CM | POA: Diagnosis not present

## 2021-02-19 DIAGNOSIS — H409 Unspecified glaucoma: Secondary | ICD-10-CM | POA: Diagnosis not present

## 2021-02-19 DIAGNOSIS — K2270 Barrett's esophagus without dysplasia: Secondary | ICD-10-CM | POA: Diagnosis not present

## 2021-02-19 DIAGNOSIS — M8088XD Other osteoporosis with current pathological fracture, vertebra(e), subsequent encounter for fracture with routine healing: Secondary | ICD-10-CM | POA: Diagnosis not present

## 2021-02-23 ENCOUNTER — Ambulatory Visit: Payer: Medicare Other | Admitting: Internal Medicine

## 2021-02-26 ENCOUNTER — Ambulatory Visit: Payer: Medicare Other | Admitting: Internal Medicine

## 2021-02-26 DIAGNOSIS — M1990 Unspecified osteoarthritis, unspecified site: Secondary | ICD-10-CM | POA: Diagnosis not present

## 2021-02-26 DIAGNOSIS — S81801D Unspecified open wound, right lower leg, subsequent encounter: Secondary | ICD-10-CM | POA: Diagnosis not present

## 2021-02-26 DIAGNOSIS — G4733 Obstructive sleep apnea (adult) (pediatric): Secondary | ICD-10-CM | POA: Diagnosis not present

## 2021-02-26 DIAGNOSIS — H409 Unspecified glaucoma: Secondary | ICD-10-CM | POA: Diagnosis not present

## 2021-02-26 DIAGNOSIS — K2270 Barrett's esophagus without dysplasia: Secondary | ICD-10-CM | POA: Diagnosis not present

## 2021-02-26 DIAGNOSIS — M8088XD Other osteoporosis with current pathological fracture, vertebra(e), subsequent encounter for fracture with routine healing: Secondary | ICD-10-CM | POA: Diagnosis not present

## 2021-02-28 ENCOUNTER — Encounter: Payer: Self-pay | Admitting: Internal Medicine

## 2021-02-28 NOTE — Assessment & Plan Note (Signed)
Has seen endocrinology.  Received prolia 11/03/20.

## 2021-02-28 NOTE — Assessment & Plan Note (Signed)
Has seen neurology.  Follow.

## 2021-02-28 NOTE — Assessment & Plan Note (Signed)
Follow lipid panel.   

## 2021-02-28 NOTE — Assessment & Plan Note (Signed)
Elbow abrasion - recent fall.  Healing.  No surrounding erythema.  Follow.

## 2021-02-28 NOTE — Assessment & Plan Note (Signed)
Recent fall.  No head injury.  Monitor.

## 2021-02-28 NOTE — Assessment & Plan Note (Signed)
Being followed by [facility] - wound care. Coming out tomorrow to change dressing.  No surrounding erythema.  Swelling improved.

## 2021-02-28 NOTE — Assessment & Plan Note (Signed)
Weight stable from last check.  Eating.  No nausea or vomiting.

## 2021-02-28 NOTE — Assessment & Plan Note (Signed)
Swelling much improved.  Should be off lasix now.  Follow.  Continue wraps.

## 2021-03-11 DIAGNOSIS — H1820 Unspecified corneal edema: Secondary | ICD-10-CM | POA: Diagnosis not present

## 2021-03-11 DIAGNOSIS — H18232 Secondary corneal edema, left eye: Secondary | ICD-10-CM | POA: Diagnosis not present

## 2021-03-11 DIAGNOSIS — H10233 Serous conjunctivitis, except viral, bilateral: Secondary | ICD-10-CM | POA: Diagnosis not present

## 2021-03-11 DIAGNOSIS — H401433 Capsular glaucoma with pseudoexfoliation of lens, bilateral, severe stage: Secondary | ICD-10-CM | POA: Diagnosis not present

## 2021-03-11 DIAGNOSIS — H353132 Nonexudative age-related macular degeneration, bilateral, intermediate dry stage: Secondary | ICD-10-CM | POA: Diagnosis not present

## 2021-03-12 ENCOUNTER — Telehealth: Payer: Self-pay

## 2021-03-12 NOTE — Telephone Encounter (Signed)
Christy Cisneros with [facility]-formally known as Christy Cisneros, called and states that they received a fax order for PT on 02/25/21 but the appt was missed. She would like to request another appt for next week for PT. Her call back number is [phone]

## 2021-03-15 NOTE — Telephone Encounter (Signed)
Spoke with Claiborne Billings at [facility]. Verbal given to move PT appt to this week.

## 2021-03-26 DIAGNOSIS — S80811A Abrasion, right lower leg, initial encounter: Secondary | ICD-10-CM | POA: Diagnosis not present

## 2021-03-26 DIAGNOSIS — Z85828 Personal history of other malignant neoplasm of skin: Secondary | ICD-10-CM | POA: Diagnosis not present

## 2021-03-26 DIAGNOSIS — Z08 Encounter for follow-up examination after completed treatment for malignant neoplasm: Secondary | ICD-10-CM | POA: Diagnosis not present

## 2021-03-31 DIAGNOSIS — H18232 Secondary corneal edema, left eye: Secondary | ICD-10-CM | POA: Diagnosis not present

## 2021-03-31 DIAGNOSIS — H1820 Unspecified corneal edema: Secondary | ICD-10-CM | POA: Diagnosis not present

## 2021-03-31 DIAGNOSIS — H401433 Capsular glaucoma with pseudoexfoliation of lens, bilateral, severe stage: Secondary | ICD-10-CM | POA: Diagnosis not present

## 2021-03-31 DIAGNOSIS — H10233 Serous conjunctivitis, except viral, bilateral: Secondary | ICD-10-CM | POA: Diagnosis not present

## 2021-03-31 DIAGNOSIS — H353132 Nonexudative age-related macular degeneration, bilateral, intermediate dry stage: Secondary | ICD-10-CM | POA: Diagnosis not present

## 2021-04-08 ENCOUNTER — Telehealth: Payer: Self-pay | Admitting: Internal Medicine

## 2021-04-08 NOTE — Telephone Encounter (Signed)
Christy Cisneros [phone] ([phone]  [name] Patient coordinator calling back in and stating they have not received the physicians orders from August.   These have been scanned in to the Patient's chart. Needing these printed and refaxed to the fax number listed above.

## 2021-04-09 NOTE — Telephone Encounter (Signed)
Re-faxed.

## 2021-04-12 ENCOUNTER — Telehealth: Payer: Self-pay | Admitting: Internal Medicine

## 2021-04-12 ENCOUNTER — Encounter: Payer: Self-pay | Admitting: *Deleted

## 2021-04-12 NOTE — Telephone Encounter (Signed)
Spoke with nurse and faxed over written order.

## 2021-04-12 NOTE — Telephone Encounter (Signed)
Received call report of fall called for more information. Unwitnessed fall this morning patient found on floor in sitting position.Front desk transferring to nurse for more information. Patient per Nurse has no bruising from fall noted and no complaints of pain vitals reported 119/65 pulse 95 fall was at 6 AM this morning.   Nurse reported unrelated concern of patient sleeping more than normal, stating patient falling a sleep at meals, only eats few bites and falls off to sleep. Ask concerning weight loss no patient weight currently 102, has been 102 at facility since admission. Nurse ask could this be age related?

## 2021-04-12 NOTE — Telephone Encounter (Signed)
Per review of medication, she is currently on 1/2 remeron q day.  If she is sleeping too much, we can taper this down.  Can to 1/2 qod x 1 week and then stop. If any problems or persistent issues, let us know.  Given was unwitnessed fall, have them check her and do neuro checks for the next 24 hours to confirm no change.

## 2021-04-18 ENCOUNTER — Inpatient Hospital Stay
Admission: EM | Admit: 2021-04-18 | Discharge: 2021-05-01 | DRG: 951 | Disposition: E | Payer: Medicare Other | Source: Skilled Nursing Facility | Attending: Obstetrics and Gynecology | Admitting: Obstetrics and Gynecology

## 2021-04-18 ENCOUNTER — Emergency Department: Payer: Medicare Other

## 2021-04-18 ENCOUNTER — Encounter: Payer: Self-pay | Admitting: Emergency Medicine

## 2021-04-18 DIAGNOSIS — Z66 Do not resuscitate: Secondary | ICD-10-CM | POA: Diagnosis present

## 2021-04-18 DIAGNOSIS — Z8673 Personal history of transient ischemic attack (TIA), and cerebral infarction without residual deficits: Secondary | ICD-10-CM | POA: Diagnosis not present

## 2021-04-18 DIAGNOSIS — M810 Age-related osteoporosis without current pathological fracture: Secondary | ICD-10-CM | POA: Diagnosis present

## 2021-04-18 DIAGNOSIS — K219 Gastro-esophageal reflux disease without esophagitis: Secondary | ICD-10-CM | POA: Diagnosis present

## 2021-04-18 DIAGNOSIS — N179 Acute kidney failure, unspecified: Secondary | ICD-10-CM | POA: Diagnosis present

## 2021-04-18 DIAGNOSIS — H409 Unspecified glaucoma: Secondary | ICD-10-CM | POA: Diagnosis present

## 2021-04-18 DIAGNOSIS — R7989 Other specified abnormal findings of blood chemistry: Secondary | ICD-10-CM | POA: Diagnosis not present

## 2021-04-18 DIAGNOSIS — I259 Chronic ischemic heart disease, unspecified: Secondary | ICD-10-CM | POA: Diagnosis not present

## 2021-04-18 DIAGNOSIS — F0390 Unspecified dementia without behavioral disturbance: Secondary | ICD-10-CM | POA: Diagnosis present

## 2021-04-18 DIAGNOSIS — Z803 Family history of malignant neoplasm of breast: Secondary | ICD-10-CM

## 2021-04-18 DIAGNOSIS — I214 Non-ST elevation (NSTEMI) myocardial infarction: Secondary | ICD-10-CM | POA: Diagnosis present

## 2021-04-18 DIAGNOSIS — G4730 Sleep apnea, unspecified: Secondary | ICD-10-CM | POA: Diagnosis present

## 2021-04-18 DIAGNOSIS — F32A Depression, unspecified: Secondary | ICD-10-CM | POA: Diagnosis present

## 2021-04-18 DIAGNOSIS — Z79899 Other long term (current) drug therapy: Secondary | ICD-10-CM | POA: Diagnosis not present

## 2021-04-18 DIAGNOSIS — F419 Anxiety disorder, unspecified: Secondary | ICD-10-CM | POA: Diagnosis not present

## 2021-04-18 DIAGNOSIS — Z7189 Other specified counseling: Secondary | ICD-10-CM | POA: Diagnosis not present

## 2021-04-18 DIAGNOSIS — Z20822 Contact with and (suspected) exposure to covid-19: Secondary | ICD-10-CM | POA: Diagnosis present

## 2021-04-18 DIAGNOSIS — Z8249 Family history of ischemic heart disease and other diseases of the circulatory system: Secondary | ICD-10-CM

## 2021-04-18 DIAGNOSIS — E7800 Pure hypercholesterolemia, unspecified: Secondary | ICD-10-CM | POA: Diagnosis present

## 2021-04-18 DIAGNOSIS — R4189 Other symptoms and signs involving cognitive functions and awareness: Secondary | ICD-10-CM | POA: Diagnosis not present

## 2021-04-18 DIAGNOSIS — Z515 Encounter for palliative care: Secondary | ICD-10-CM | POA: Diagnosis not present

## 2021-04-18 DIAGNOSIS — R0682 Tachypnea, not elsewhere classified: Secondary | ICD-10-CM | POA: Diagnosis not present

## 2021-04-18 LAB — CBC WITH DIFFERENTIAL/PLATELET
Abs Immature Granulocytes: 0.05 10*3/uL (ref 0.00–0.07)
Basophils Absolute: 0 10*3/uL (ref 0.0–0.1)
Basophils Relative: 0 %
Eosinophils Absolute: 0 10*3/uL (ref 0.0–0.5)
Eosinophils Relative: 0 %
HCT: 39.9 % (ref 36.0–46.0)
Hemoglobin: 12.6 g/dL (ref 12.0–15.0)
Immature Granulocytes: 0 %
Lymphocytes Relative: 4 %
Lymphs Abs: 0.5 10*3/uL — ABNORMAL LOW (ref 0.7–4.0)
MCH: 29.5 pg (ref 26.0–34.0)
MCHC: 31.6 g/dL (ref 30.0–36.0)
MCV: 93.4 fL (ref 80.0–100.0)
Monocytes Absolute: 0.6 10*3/uL (ref 0.1–1.0)
Monocytes Relative: 4 %
Neutro Abs: 12.2 10*3/uL — ABNORMAL HIGH (ref 1.7–7.7)
Neutrophils Relative %: 92 %
Platelets: 315 10*3/uL (ref 150–400)
RBC: 4.27 MIL/uL (ref 3.87–5.11)
RDW: 14.5 % (ref 11.5–15.5)
WBC: 13.4 10*3/uL — ABNORMAL HIGH (ref 4.0–10.5)
nRBC: 0.1 % (ref 0.0–0.2)

## 2021-04-18 LAB — COMPREHENSIVE METABOLIC PANEL
ALT: 105 U/L — ABNORMAL HIGH (ref 0–44)
AST: 108 U/L — ABNORMAL HIGH (ref 15–41)
Albumin: 3.5 g/dL (ref 3.5–5.0)
Alkaline Phosphatase: 112 U/L (ref 38–126)
Anion gap: 20 — ABNORMAL HIGH (ref 5–15)
BUN: 79 mg/dL — ABNORMAL HIGH (ref 8–23)
CO2: 16 mmol/L — ABNORMAL LOW (ref 22–32)
Calcium: 8.8 mg/dL — ABNORMAL LOW (ref 8.9–10.3)
Chloride: 104 mmol/L (ref 98–111)
Creatinine, Ser: 3.27 mg/dL — ABNORMAL HIGH (ref 0.44–1.00)
GFR, Estimated: 12 mL/min — ABNORMAL LOW (ref >60)
Glucose, Bld: 146 mg/dL — ABNORMAL HIGH (ref 70–99)
Potassium: 4.5 mmol/L (ref 3.5–5.1)
Sodium: 140 mmol/L (ref 135–145)
Total Bilirubin: 1.2 mg/dL (ref 0.3–1.2)
Total Protein: 7.1 g/dL (ref 6.5–8.1)

## 2021-04-18 LAB — PROCALCITONIN: Procalcitonin: 2.8 ng/mL

## 2021-04-18 LAB — URINALYSIS, COMPLETE (UACMP) WITH MICROSCOPIC
Specific Gravity, Urine: 1.025 (ref 1.005–1.030)
Squamous Epithelial / HPF: NONE SEEN (ref 0–5)

## 2021-04-18 LAB — RESP PANEL BY RT-PCR (FLU A&B, COVID) ARPGX2
Influenza A by PCR: NEGATIVE
Influenza B by PCR: NEGATIVE
SARS Coronavirus 2 by RT PCR: NEGATIVE

## 2021-04-18 LAB — TROPONIN I (HIGH SENSITIVITY)
Troponin I (High Sensitivity): 4352 ng/L (ref <18)
Troponin I (High Sensitivity): 3320 ng/L (ref <18)

## 2021-04-18 MED ORDER — GLYCOPYRROLATE 1 MG PO TABS
1.0000 mg | ORAL_TABLET | ORAL | Status: DC | PRN
  Filled 2021-04-18: qty 1

## 2021-04-18 MED ORDER — ONDANSETRON HCL 4 MG/2ML IJ SOLN: 4.0000 mg | Freq: Four times a day (QID) | INTRAMUSCULAR | Status: DC | PRN

## 2021-04-18 MED ORDER — SODIUM CHLORIDE 0.9 % IV BOLUS
500.0000 mL | Freq: Once | INTRAVENOUS | Status: AC
  Administered 2021-04-18: 500 mL via INTRAVENOUS

## 2021-04-18 MED ORDER — POLYVINYL ALCOHOL 1.4 % OP SOLN
1.0000 [drp] | Freq: Four times a day (QID) | OPHTHALMIC | Status: DC | PRN
  Filled 2021-04-18: qty 15

## 2021-04-18 MED ORDER — GLYCOPYRROLATE 0.2 MG/ML IJ SOLN
0.2000 mg | INTRAMUSCULAR | Status: DC | PRN
  Filled 2021-04-18: qty 1

## 2021-04-18 MED ORDER — SODIUM CHLORIDE 0.9% FLUSH
3.0000 mL | INTRAVENOUS | Status: DC | PRN
  Administered 2021-04-19: 22:00:00 3 mL via INTRAVENOUS

## 2021-04-18 MED ORDER — SODIUM CHLORIDE 0.9 % IV SOLN: 250.0000 mL | INTRAVENOUS | Status: DC | PRN

## 2021-04-18 MED ORDER — MORPHINE SULFATE (CONCENTRATE) 10 MG/0.5ML PO SOLN
5.0000 mg | ORAL | Status: DC | PRN
Start: 2021-04-18 — End: 2021-04-20
  Administered 2021-04-18 – 2021-04-19 (×3): 5 mg via ORAL
  Filled 2021-04-18 (×5): qty 0.5

## 2021-04-18 MED ORDER — ACETAMINOPHEN 650 MG RE SUPP: 650.0000 mg | Freq: Four times a day (QID) | RECTAL | Status: DC | PRN

## 2021-04-18 MED ORDER — ACETAMINOPHEN 325 MG PO TABS: 650.0000 mg | ORAL_TABLET | Freq: Four times a day (QID) | ORAL | Status: DC | PRN

## 2021-04-18 MED ORDER — ONDANSETRON 4 MG PO TBDP
4.0000 mg | ORAL_TABLET | Freq: Four times a day (QID) | ORAL | Status: DC | PRN
  Filled 2021-04-18: qty 1

## 2021-04-18 MED ORDER — MORPHINE SULFATE (CONCENTRATE) 10 MG/0.5ML PO SOLN
5.0000 mg | ORAL | Status: DC | PRN
  Filled 2021-04-18: qty 0.5

## 2021-04-18 MED ORDER — BIOTENE DRY MOUTH MT LIQD
15.0000 mL | OROMUCOSAL | Status: DC | PRN
  Filled 2021-04-18: qty 15

## 2021-04-18 MED ORDER — HALOPERIDOL 0.5 MG PO TABS
0.5000 mg | ORAL_TABLET | ORAL | Status: DC | PRN
  Filled 2021-04-18: qty 1

## 2021-04-18 MED ORDER — SODIUM CHLORIDE 0.9% FLUSH
3.0000 mL | Freq: Two times a day (BID) | INTRAVENOUS | Status: DC
  Administered 2021-04-18 – 2021-04-19 (×3): 3 mL via INTRAVENOUS

## 2021-04-18 MED ORDER — HALOPERIDOL LACTATE 5 MG/ML IJ SOLN: 0.5000 mg | INTRAMUSCULAR | Status: DC | PRN

## 2021-04-18 MED ORDER — HALOPERIDOL LACTATE 2 MG/ML PO CONC
0.5000 mg | ORAL | Status: DC | PRN
  Filled 2021-04-18 (×2): qty 0.3

## 2021-04-18 NOTE — ED Triage Notes (Signed)
Pt arrives via EMS from nursing home with reports of worse than normal weakness. Pt moaning and EMS reports that is pts baseline. Pt unable to answer questions appropriately.

## 2021-04-18 NOTE — ED Provider Notes (Signed)
[facility] Emergency Department Provider Note ____________________________________________   Event Date/Time   First MD Initiated Contact with Patient 04/28/2021 [phone]     (approximate)  I have reviewed the triage vital signs and the nursing notes.   HISTORY  Chief Complaint Weakness  Level 5 caveat: History of present illness limited due to cognitive impairment  HPI Christy Cisneros is a 87 y.o. female with PMH as noted below including sleep apnea, prior CVA, and cognitive impairment who presents from her memory care facility due to apparent weakness/lethargy.  Per the paperwork from the facility, the patient reported chest pain and was found to be lethargic.  EMS states that staff reported that she seemed weaker than normal.  The patient herself is unable to give any complaints.  She is moaning and saying "help me" but does not answer when we ask about specific pain.  Per EMS, the staff at the facility stated that she frequently moans in this manner and says "help me" but the change today is that she is more lethargic.  Past Medical History:  Diagnosis Date   Barrett's esophagus with esophagitis    Chronic bronchitis (HCC)    Depression    GERD (gastroesophageal reflux disease)    Glaucoma    Hypercholesteremia    Osteoarthritis    Osteoporosis    Sleep apnea    has CPAP   TIA (transient ischemic attack)     Patient Active Problem List   Diagnosis Date Noted   Anxiety 01/28/2021   Edema, lower extremity 01/26/2021   Leg laceration 11/29/2020   Compression fracture of T8 vertebra ([place]) 11/20/2020   Blindness 11/20/2020   Fall 11/14/2020   Cognitive impairment 11/14/2020   Encounter for completion of form with patient 10/10/2020   Toe pain 06/07/2020   Weight loss 03/25/2019   Pain due to onychomycosis of toenails of both feet 03/11/2019   Depression 05/06/2018   Abrasion 02/28/2018   History of CVA (cerebrovascular accident) 03/16/2017    Memory change 09/04/2016   Pneumonia 07/17/2016   Mild cognitive impairment 05/15/2016   Dizziness 04/21/2016   Neck pain 02/11/2016   Muscle cramps 11/14/2015   Urine incontinence 08/16/2015   Hematoma 08/16/2015   Weakness 07/12/2015   Headache 06/16/2015   Unsteady gait 06/01/2015   GERD (gastroesophageal reflux disease) 08/11/2014   DYSPNEA 04/17/2009   HYPERCHOLESTEROLEMIA 04/16/2009   Obstructive sleep apnea 04/16/2009   Glaucoma 04/16/2009   Transient cerebral ischemia 04/16/2009   BARRETTS ESOPHAGUS 04/16/2009   OSTEOARTHRITIS 04/16/2009   Osteoporosis 04/16/2009    Past Surgical History:  Procedure Laterality Date   BUNIONECTOMY     left   ROTATOR CUFF REPAIR     right    Prior to Admission medications   Medication Sig Start Date End Date Taking? Authorizing Provider  mirtazapine (REMERON) 7.5 MG tablet Take 0.5 tablets by mouth daily. 02/15/21   [provider]  omeprazole (PRILOSEC) 20 MG capsule TAKE ONE CAPSULE TWICE A DAY BEFORE MEALS 11/17/16   Einar Pheasant, MD  tobramycin (TOBREX) 0.3 % ophthalmic solution  11/02/17   [provider]    Allergies Bacitracin, Cephalexin, Codeine, Avelox [moxifloxacin hcl in nacl], Cefdinir, Clarithromycin, Hydrocodone-acetaminophen, Lansoprazole, Polymyxin b, Vigamox [moxifloxacin], Brimonidine, and Erythromycin  Family History  Problem Relation Age of Onset   Breast cancer Sister    Heart disease Mother    Alcohol abuse Father     Social History Social History   Tobacco Use  Smoking status: Never   Smokeless tobacco: Never  Vaping Use   Vaping Use: Never used  Substance Use Topics   Alcohol use: Yes    Alcohol/week: 0.0 standard drinks    Comment: wine/brandy occasional   Drug use: No    Review of Systems Level 5 caveat: Unable to obtain review of systems due to cognitive impairment   ____________________________________________   PHYSICAL EXAM:  VITAL SIGNS: ED Triage Vitals   Enc Vitals Group     BP 04/01/2021 0921 117/67     Pulse Rate 04/17/2021 0921 63     Resp 04/08/2021 0921 (!) 21     Temp 04/02/2021 0929 98 F (36.7 C)     Temp Source 04/23/2021 0929 Oral     SpO2 04/29/2021 0921 100 %     Weight 04/11/2021 0926 106 lb 14.4 oz (48.5 kg)     Height 04/27/2021 0926 5' (1.524 m)     Head Circumference --      Peak Flow --      Pain Score --      Pain Loc --      Pain Edu? --      Excl. in [place]? --     Constitutional: Alert, confused.  Anxious appearing but in no acute distress. Eyes: Conjunctivae are normal.  EOMI.  PERRLA. Head: Atraumatic. Nose: No congestion/rhinnorhea. Mouth/Throat: Mucous membranes are moist.   Neck: Normal range of motion.  Cardiovascular: Normal rate, regular rhythm. Grossly normal heart sounds.  Good peripheral circulation. Respiratory: Normal respiratory effort.  No retractions. Lungs CTAB. Gastrointestinal: Soft and nontender. No distention.  Genitourinary: No flank tenderness. Musculoskeletal: No lower extremity edema.  Extremities warm and well perfused.  Neurologic: Motor intact in all extremities.   Skin:  Skin is warm and dry. No rash noted. Psychiatric: Anxious appearing.  ____________________________________________   LABS (all labs ordered are listed, but only abnormal results are displayed)  Labs Reviewed  COMPREHENSIVE METABOLIC PANEL - Abnormal; Notable for the following components:      Result Value   CO2 16 (*)    Glucose, Bld 146 (*)    BUN 79 (*)    Creatinine, Ser 3.27 (*)    Calcium 8.8 (*)    AST 108 (*)    ALT 105 (*)    GFR, Estimated 12 (*)    Anion gap 20 (*)    All other components within normal limits  CBC WITH DIFFERENTIAL/PLATELET - Abnormal; Notable for the following components:   WBC 13.4 (*)    Neutro Abs 12.2 (*)    Lymphs Abs 0.5 (*)    All other components within normal limits  TROPONIN I (HIGH SENSITIVITY) - Abnormal; Notable for the following components:   Troponin I (High  Sensitivity) 4,352 (*)    All other components within normal limits  RESP PANEL BY RT-PCR (FLU A&B, COVID) ARPGX2  URINALYSIS, COMPLETE (UACMP) WITH MICROSCOPIC  TROPONIN I (HIGH SENSITIVITY)   ____________________________________________  EKG  ED ECG REPORT I, Arta Silence, the attending physician, personally viewed and interpreted this ECG.  Date: 04/09/2021 EKG Time: 0918 Rate: 105 Rhythm: Sinus tachycardia QRS Axis: Right axis Intervals: normal ST/T Wave abnormalities: Nonspecific ST abnormalities Narrative Interpretation: Nonspecific abnormalities with no evidence of acute ischemia   ED ECG REPORT I, Arta Silence, the attending physician, personally viewed and interpreted this ECG.  Date: 04/04/2021 EKG Time: 09 25 Rate: 100 Rhythm: normal sinus rhythm with PVCs QRS Axis: Right axis Intervals: normal ST/T Wave  abnormalities: Nonspecific abnormalities, possible minimal lateral ST elevation Narrative Interpretation: Nonspecific abnormalities with no evidence of acute ischemia; no dynamic change when compared to EKG of 0918 today   ____________________________________________  RADIOLOGY  Chest x-ray interpreted by me shows right lung opacification  ____________________________________________   PROCEDURES  Procedure(s) performed: No  Procedures  Critical Care performed: No ____________________________________________   INITIAL IMPRESSION / ASSESSMENT AND PLAN / ED COURSE  Pertinent labs & imaging results that were available during my care of the patient were reviewed by me and considered in my medical decision making (see chart for details).   87 year old female with PMH as noted above presents from her memory care facility with apparent lethargy.  The patient herself is unable to give any history.  EMS sent her EKG from the field which showed possible ST elevations in the lateral leads, however this did not meet STEMI criteria and the patient  has not reported chest pain.  I reviewed the past medical records in [place].  There is a telephone encounter note from 9/12 reporting that the patient had a fall but had normal vital signs and no visible injuries.  She was noted to be sleeping more than normal and not eating much.  She was recommended by the PMD Dr. Nicki Reaper to have neurochecks and to decrease the dose of Remeron.  The patient was last seen in the ED after being found outside.  She was eventually referred to SNF.  On exam currently, the patient is alert but appears somewhat confused and is not answering questions.  She is moaning and saying "help me" but apparently per EMS this is baseline.  She is consolable.  Vital signs are normal.  Neurologic exam is nonfocal.  The remainder of the exam is unremarkable for any acute findings.  Differential is broad but includes UTI or other acute infection, dehydration, electrolyte abnormality, AKI, other metabolic cause, or possible cardiac etiology.  Given the nonfocal neurologic exam there is no evidence of primary CNS cause.  EKG x2 here shows some nonspecific ST abnormalities but no evidence of STEMI.  We will obtain basic labs, urinalysis, chest x-ray, cardiac enzymes, COVID swab, and reassess.  ----------------------------------------- 10:40 AM on 04/03/2021 -----------------------------------------  Initial troponin is over 4000.  I consulted Dr. Lovena Le from cardiology.  He advises that in this 87 year old patient who has dementia/cognitive impairment and is DNR/DNI, he would recommend comfort care only with no heparin infusion or other advanced interventions.  I attempted to contact the patient's listed PoA, Harle Stanford, who lives in [place] in order to discuss the plan of care and the patient's wishes.  I left a HIPAA compliant voice message.  The patient remains hemodynamically stable.  We will await the rest of the work-up and plan for  admission.  ----------------------------------------- 11:37 AM on 04/27/2021 -----------------------------------------  X-ray shows opacification of the right lung; etiology is unclear although pneumonia is possible.  I will order a procalcitonin.  I discussed the case with Ms. Tretha Sciara the [place] who agrees with the current management plan of comfort /supportive care without advanced interventions.  I consulted Dr. Francine Graven from the hospitalist service for admission. ____________________________________________   FINAL CLINICAL IMPRESSION(S) / ED DIAGNOSES  Final diagnoses:  NSTEMI (non-ST elevated myocardial infarction) ([place])  AKI (acute kidney injury) ([place])      NEW MEDICATIONS STARTED DURING THIS VISIT:  New Prescriptions   No medications on file     Note:  This document was prepared using Dragon voice recognition software and  may include unintentional dictation errors.    Arta Silence, MD 04/09/2021 1140

## 2021-04-18 NOTE — ED Notes (Signed)
MD aware of trouble obtaining pts O2 sats and states pt is comfort care.

## 2021-04-18 NOTE — H&P (Signed)
History and Physical    ERIAH CHAM [id number] DOB: 09-19-1919 DOA: 04/24/2021  PCP: Einar Pheasant, MD   Patient coming from: [facility] unit  I have personally briefly reviewed patient's old medical records in [place]  Chief Complaint: Weakness  HPI: MAHOGONY HARRIER is a 87 y.o. female with medical history significant for  TIA, osteoarthritis, glaucoma, GERD, depression, dementia who was brought into the ER by EMS for evaluation of weakness and increased lethargy. Per paperwork from the facility patient reported chest pain and was found to be lethargic and weaker than normal.  Chart review shows that her primary care provider was contacted due to increased lethargy and sedation and he requested to decrease patient's dose of Remeron. I am unable to do review of systems on this patient due to her history of dementia. Admission has been requested because patient has a non-ST elevation MI and acute kidney injury. Labs show sodium 140, potassium 4.5, chloride 104, bicarb 16, glucose 146, BUN 79, creatinine 3.27, calcium 8.8, alkaline phosphatase 112, albumin 3.5, AST 108, ALT 105, total protein 7.1, troponin 4352, white count 13.4, hemoglobin 12.6, hematocrit 39.9 MCV 93.4, RDW 14.5, platelet count 315 Respiratory viral panel is negative Chest x-ray reviewed by me shows extensive right chest opacification which could be asymmetric edema or extensive pneumonia. Small pleural effusions and hypoventilation. Twelve-lead EKG reviewed by me shows sinus tachycardia   ED Course: Patient is a 87 year old Caucasian female who was sent to the ER from the memory care unit where she resides for evaluation of increased weakness and lethargy as well as chest pain. Patient found to have an acute non-ST elevation MI as well as acute kidney injury. ER physician discussed with cardiology who recommends comfort measures in this patient with advanced dementia and cognitive  impairment. This was discussed with patient's healthcare power of attorney, Ms Doneta Public who is in agreement for comfort measures and for hospice evaluation.     Review of Systems: As per HPI otherwise all other systems reviewed and negative.    Past Medical History:  Diagnosis Date   Barrett's esophagus with esophagitis    Chronic bronchitis (HCC)    Depression    GERD (gastroesophageal reflux disease)    Glaucoma    Hypercholesteremia    Osteoarthritis    Osteoporosis    Sleep apnea    has CPAP   TIA (transient ischemic attack)     Past Surgical History:  Procedure Laterality Date   BUNIONECTOMY     left   ROTATOR CUFF REPAIR     right     reports that she has never smoked. She has never used smokeless tobacco. She reports current alcohol use. She reports that she does not use drugs.  Allergies  Allergen Reactions   Bacitracin Other (See Comments)    Tearing, itching, swelling, redness Other Reaction: tearing, swollen red eyes Tearing, itching, swelling, redness   Cephalexin Other (See Comments)    HA, nausea, gas, abdominal pain Other reaction(s): Abdominal Pain, Other (See Comments) Other Reaction: N&V HA, nausea, gas, abdominal pain   Codeine Other (See Comments)    HA, nausea, gas, abdominal pain Other reaction(s): Abdominal Pain Other Reaction: N&V HA, nausea, gas, abdominal pain   Avelox [Moxifloxacin Hcl In Nacl] Other (See Comments)    Dizziness, weakness   Cefdinir Nausea And Vomiting   Clarithromycin     HA, nausea, gas, abdominal pain   Hydrocodone-Acetaminophen     REACTION: chest pain,  constipation, nausea   Lansoprazole     REACTION: cramps, nausea, diarrhea   Polymyxin B     Itching and redness   Vigamox [Moxifloxacin] Itching    Soreness, irritability, loss of appetite,  Dizziness, diarrhea, muscle weakness   Brimonidine Itching    Burning, stinging, bloody eye, dry mouth Burning, stinging, bloody eye, dry mouth Burning,  stinging, bloody eye, dry mouth   Erythromycin Rash    HA, nausea, gas, abdominal pain HA, nausea, gas, abdominal pain    Family History  Problem Relation Age of Onset   Breast cancer Sister    Heart disease Mother    Alcohol abuse Father       Prior to Admission medications   Medication Sig Start Date End Date Taking? Authorizing Provider  mirtazapine (REMERON) 7.5 MG tablet Take 0.5 tablets by mouth daily. 02/15/21   [provider]  omeprazole (PRILOSEC) 20 MG capsule TAKE ONE CAPSULE TWICE A DAY BEFORE MEALS 11/17/16   Einar Pheasant, MD  tobramycin (TOBREX) 0.3 % ophthalmic solution  11/02/17   [provider]    Physical Exam: Vitals:   04/29/2021 1030 04/14/2021 1100 04/21/2021 1130 04/17/2021 1200  BP: 115/76  105/70 100/83  Pulse: 91  93   Resp: 19  16 (!) 21  Temp:      TempSrc:      SpO2: 100% 100% 99%   Weight:      Height:         Vitals:   04/07/2021 1030 04/03/2021 1100 04/12/2021 1130 04/02/2021 1200  BP: 115/76  105/70 100/83  Pulse: 91  93   Resp: 19  16 (!) 21  Temp:      TempSrc:      SpO2: 100% 100% 99%   Weight:      Height:          Constitutional: Lethargic and moaning out . Not in any apparent distress.  Frail HEENT:      Head: Normocephalic and atraumatic.         Eyes: PERLA, EOMI, Conjunctivae are normal. Sclera is non-icteric.       Mouth/Throat: Mucous membranes are moist.       Neck: Supple with no signs of meningismus. Cardiovascular: Regular rate and rhythm. No murmurs, gallops, or rubs. 2+ symmetrical distal pulses are present . No JVD. 2+ LE edema Respiratory: Respiratory effort normal .  decreased breath sounds right lung field, crackles in both bases bilaterally. No wheezes or rhonchi.  Gastrointestinal: Soft, non tender, and non distended with positive bowel sounds.  Genitourinary: No CVA tenderness. Musculoskeletal: Nontender with normal range of motion in all extremities. No cyanosis, or erythema of  extremities. Neurologic: Unable to assess. Skin: Skin is warm, dry.  No rash or ulcers Psychiatric: Unable to assess   Labs on Admission: I have personally reviewed following labs and imaging studies  CBC: Recent Labs  Lab 04/16/2021 0927  WBC 13.4*  NEUTROABS 12.2*  HGB 12.6  HCT 39.9  MCV 93.4  PLT 123456   Basic Metabolic Panel: Recent Labs  Lab 04/24/2021 0927  NA 140  K 4.5  CL 104  CO2 16*  GLUCOSE 146*  BUN 79*  CREATININE 3.27*  CALCIUM 8.8*   GFR: Estimated Creatinine Clearance: 6.9 mL/min (A) (by C-G formula based on SCr of 3.27 mg/dL (H)). Liver Function Tests: Recent Labs  Lab 04/30/2021 0927  AST 108*  ALT 105*  ALKPHOS 112  BILITOT 1.2  PROT 7.1  ALBUMIN 3.5  No results for input(s): LIPASE, AMYLASE in the last 168 hours. No results for input(s): AMMONIA in the last 168 hours. Coagulation Profile: No results for input(s): INR, PROTIME in the last 168 hours. Cardiac Enzymes: No results for input(s): CKTOTAL, CKMB, CKMBINDEX, TROPONINI in the last 168 hours. BNP (last 3 results) No results for input(s): PROBNP in the last 8760 hours. HbA1C: No results for input(s): HGBA1C in the last 72 hours. CBG: No results for input(s): GLUCAP in the last 168 hours. Lipid Profile: No results for input(s): CHOL, HDL, LDLCALC, TRIG, CHOLHDL, LDLDIRECT in the last 72 hours. Thyroid Function Tests: No results for input(s): TSH, T4TOTAL, FREET4, T3FREE, THYROIDAB in the last 72 hours. Anemia Panel: No results for input(s): VITAMINB12, FOLATE, FERRITIN, TIBC, IRON, RETICCTPCT in the last 72 hours. Urine analysis:    Component Value Date/Time   COLORURINE YELLOW (A) 12/19/2020 0744   APPEARANCEUR CLEAR (A) 12/19/2020 0744   APPEARANCEUR Clear 12/06/2012 1257   LABSPEC 1.020 12/19/2020 0744   LABSPEC 1.008 12/06/2012 1257   PHURINE 5.0 12/19/2020 0744   GLUCOSEU 50 (A) 12/19/2020 0744   GLUCOSEU NEGATIVE 08/30/2017 1231   HGBUR NEGATIVE 12/19/2020 0744    BILIRUBINUR NEGATIVE 12/19/2020 0744   BILIRUBINUR neg 08/07/2017 1014   BILIRUBINUR Negative 12/06/2012 1257   KETONESUR NEGATIVE 12/19/2020 0744   PROTEINUR 30 (A) 12/19/2020 0744   UROBILINOGEN 0.2 08/30/2017 1231   NITRITE NEGATIVE 12/19/2020 0744   LEUKOCYTESUR NEGATIVE 12/19/2020 0744   LEUKOCYTESUR Negative 12/06/2012 1257    Radiological Exams on Admission: DG Chest Portable 1 View  Result Date: 04/07/2021 CLINICAL DATA:  Weakness and chest pain EXAM: PORTABLE CHEST 1 VIEW COMPARISON:  11/20/2020 chest CT FINDINGS: Low volume chest with hazy opacity asymmetric to the right. Small pleural effusions. Cardiomegaly with distortion from rotation. Spinal degeneration and generalized osteopenia IMPRESSION: Extensive right chest opacification which could be asymmetric edema or extensive pneumonia. Small pleural effusions and hypoventilation. Electronically Signed   By: Jorje Guild M.D.   On: 04/12/2021 09:50     Assessment/Plan Principal Problem:   NSTEMI (non-ST elevated myocardial infarction) [facility]) Active Problems:   Cognitive impairment   AKI (acute kidney injury) ([place])   Dementia ([place])      Patient is a 87 year old female with a history of dementia who was sent to the ER for evaluation of increased lethargy and weakness and found to have an acute non-ST elevation MI as well as acute kidney injury.  She also has transaminitis and chest x-ray shows right chest opacification. Due to patient's advanced dementia and severe cognitive deficit she has been placed on comfort measures after discussion with patient's niece and healthcare power of attorney Doneta Public. I have requested palliative care consult Supportive care  DVT prophylaxis: None  Code Status: DNR  Family Communication: Greater than 50% of time was spent discussing patient's condition and plan of care with her niece and healthcare power of attorney Coralyn Mark over the phone.  All questions and concerns have been  addressed.  Patient's healthcare power of attorney is in agreement for hospice evaluation and comfort measures at this time. Disposition Plan: Back to previous home environment Consults called: Palliative care Status: At the time of admission, it appears that the appropriate admission status for this patient is inpatient. This is judged to be reasonable and necessary to provide the required intensity of service to ensure the patient's safety given the presenting symptoms, physical exam findings, and initial radiographic and laboratory data in the context of their  comorbid conditions. Patient requires inpatient status due to high intensity of service, high risk for further deterioration and high frequency of surveillance required.     Collier Bullock MD [facility]     04/30/2021, 12:30 PM

## 2021-04-18 NOTE — ED Notes (Signed)
Pt requesting food. MD made aware and diet order placed.

## 2021-04-19 ENCOUNTER — Other Ambulatory Visit: Payer: Self-pay

## 2021-04-19 ENCOUNTER — Telehealth: Payer: Self-pay | Admitting: Internal Medicine

## 2021-04-19 DIAGNOSIS — I259 Chronic ischemic heart disease, unspecified: Secondary | ICD-10-CM

## 2021-04-19 DIAGNOSIS — N179 Acute kidney failure, unspecified: Secondary | ICD-10-CM

## 2021-04-19 DIAGNOSIS — F419 Anxiety disorder, unspecified: Secondary | ICD-10-CM

## 2021-04-19 DIAGNOSIS — Z7189 Other specified counseling: Secondary | ICD-10-CM

## 2021-04-19 DIAGNOSIS — Z515 Encounter for palliative care: Secondary | ICD-10-CM | POA: Diagnosis not present

## 2021-04-19 DIAGNOSIS — R4189 Other symptoms and signs involving cognitive functions and awareness: Secondary | ICD-10-CM

## 2021-04-19 DIAGNOSIS — I214 Non-ST elevation (NSTEMI) myocardial infarction: Secondary | ICD-10-CM | POA: Diagnosis not present

## 2021-04-19 DIAGNOSIS — R0682 Tachypnea, not elsewhere classified: Secondary | ICD-10-CM

## 2021-04-19 DIAGNOSIS — F0390 Unspecified dementia without behavioral disturbance: Secondary | ICD-10-CM

## 2021-04-19 DIAGNOSIS — R7989 Other specified abnormal findings of blood chemistry: Secondary | ICD-10-CM

## 2021-04-19 MED ORDER — LORAZEPAM 2 MG/ML IJ SOLN: 0.5000 mg | INTRAMUSCULAR | Status: DC | PRN

## 2021-04-19 MED ORDER — LORAZEPAM 0.5 MG PO TABS: 0.5000 mg | ORAL_TABLET | ORAL | Status: DC | PRN

## 2021-04-19 MED ORDER — MORPHINE SULFATE (PF) 2 MG/ML IV SOLN
1.0000 mg | Freq: Once | INTRAVENOUS | Status: AC
  Administered 2021-04-19: 1 mg via INTRAVENOUS
  Filled 2021-04-19: qty 1

## 2021-04-19 MED ORDER — SODIUM CHLORIDE 0.9 % IV SOLN
0.5000 mg/h | INTRAVENOUS | Status: DC
  Administered 2021-04-19: 16:00:00 0.5 mg/h via INTRAVENOUS
  Filled 2021-04-19: qty 5

## 2021-04-19 MED ORDER — LORAZEPAM 2 MG/ML IJ SOLN
0.5000 mg | Freq: Once | INTRAMUSCULAR | Status: AC
  Administered 2021-04-19: 0.5 mg via INTRAVENOUS
  Filled 2021-04-19: qty 1

## 2021-04-19 MED ORDER — MORPHINE SULFATE (PF) 2 MG/ML IV SOLN
1.0000 mg | INTRAVENOUS | Status: DC | PRN
  Administered 2021-04-19: 1 mg via INTRAVENOUS
  Filled 2021-04-19: qty 1

## 2021-04-19 MED ORDER — LORAZEPAM 2 MG/ML PO CONC
0.5000 mg | ORAL | Status: DC | PRN
  Filled 2021-04-19: qty 0.25

## 2021-04-19 MED ORDER — HYDROMORPHONE BOLUS VIA INFUSION
0.2500 mg | INTRAVENOUS | Status: DC | PRN
  Administered 2021-04-19: 16:00:00 0.25 mg via INTRAVENOUS
  Filled 2021-04-19: qty 1

## 2021-04-19 NOTE — Consult Note (Addendum)
Palliative Care Consult Note                                  Date: 04/19/2021   Patient Name: Christy Cisneros  DOB: May 27, 1920  MRN: [id number]  Age / Sex: 87 y.o., female  PCP: Einar Pheasant, MD Referring Physician: Gwynne Edinger, MD  Reason for Consultation: Establishing goals of care, Non pain symptom management, Pain control, and Terminal Care  HPI/Patient Profile: 87 y.o. female  with past medical history of TIA, osteoarthritis, glaucoma, GERD, depression, dementia admitted on 04/24/2021 with cc of weakness, chest pain, lethargy. In the ER she was noted end stage dementia and unable to participate in ROS. ER workup found NSTEMI and AKI with troponin 4352, Cr 3.27. Chest XRay with small pleural effusions.   She has a niece who lives in [place] and local support persons (friends and neighbors). She currently resides at a memory care unit.  ED Physician discussed with cardiology who recommended comfort measures due to advanced dementia and cognitive impairment. Hospitalist discussed with Christy Cisneros (niece/HCPOA) who agreed to comfort care.  PMT was consulted to symptom management, end of life care.  Past Medical History:  Diagnosis Date   Barrett's esophagus with esophagitis    Chronic bronchitis (HCC)    Depression    GERD (gastroesophageal reflux disease)    Glaucoma    Hypercholesteremia    Osteoarthritis    Osteoporosis    Sleep apnea    has CPAP   TIA (transient ischemic attack)     Subjective:   This NP Walden Field reviewed medical records, received report from team, assessed the patient and then meet at the patient's bedside to discuss diagnosis, prognosis, GOC, EOL wishes disposition and options.  I met with the patient and her friend at bedside. Patient is unable to participate in the conversation, mostly moaning and mumbling. She appears to be in pain and anxious. Some increased respiratory rate but no  respiratory distress at this time.  She states the goal of the patient's left wants is to prevent discomfort knowing that she is at the end of her life.  We discussed a general plan including a pain management drip, medications for anxiety, agitation, secretions.  We would liberalize visitation given her end-of-life status.  Nurse verbalized she would contact red control try to her priority for bed placement given her end-of-life status.  Her family/friends verbalize that they prefer that she remain in the hospital for the end of life rather than go through the distress of a transfer to a hospice facility.   Concept of Palliative Care was introduced as specialized medical care for people and their families living with serious illness.  If focuses on providing relief from the symptoms and stress of a serious illness.  The goal is to improve quality of life for both the patient and the family. Values and goals of care important to patient and family were attempted to be elicited.  Created space and opportunity for patient  and family to explore thoughts and feelings regarding current medical situation   Natural trajectory and current clinical status were discussed. Questions and concerns addressed. Patient  encouraged to call with questions or concerns.    Patient/Family Understanding of Illness: The patient is unable to participate.  Her left 1 daughter bedside states that he understands she is at the end of her life.  She has advanced dementia  and they noted she has had a heart attack which has caused an acute change.  Their biggest concern is her comfort.  They noted there is no way to fix this.  Today's Discussion: Today we discussed that the patient is indeed at the end of her life and will likely pass away in the hospital.  They prefer this over transfer to hospice given the trauma that ambulance transfer may cause. They emphasize that the patient wishes her [facility] to be [facility] to [facility] and have  provided paperwork documenting as such. This was scanned into [employer]/Advanced Directive.  Review of Systems  Unable to perform ROS: Dementia   Objective:   Primary Diagnoses: Present on Admission:  (Resolved) Cognitive impairment  AKI (acute kidney injury) ([place])  NSTEMI (non-ST elevated myocardial infarction) ([place])  Dementia ([place])   Physical Exam Vitals and nursing note reviewed.  Constitutional:      General: She is in acute distress.     Appearance: She is ill-appearing.  HENT:     Head: Normocephalic and atraumatic.  Pulmonary:     Effort: Tachypnea present. No respiratory distress.  Abdominal:     General: Abdomen is flat.     Palpations: Abdomen is soft.  Skin:    General: Skin is warm and dry.  Neurological:     Mental Status: She is alert. She is disoriented.     Comments: moaning    Vital Signs:  BP 98/83   Pulse 94   Temp 98 F (36.7 C) (Oral)   Resp 17   Ht 5' (1.524 m)   Wt 48.5 kg   SpO2 96%   BMI 20.88 kg/m   Palliative Assessment/Data: 10%    Advanced Care Planning:   Primary Decision Maker: NEXT OF KIN  Code Status/Advance Care Planning: DNR  A discussion was had today regarding advanced directives. Concepts specific to code status, artifical feeding and hydration, continued IV antibiotics and rehospitalization was had.  The difference between a aggressive medical intervention path and a palliative comfort care path for this patient at this time was had.  Decisions/Changes to ACP: Change to comfort care  Assessment & Plan:   Impression: 87 year old female with baseline end stage dementia admitted for NSTEMI. Her niece lives in [place] and multiple friends/neighbors locally who help look after her. She resides in a memory care unit. After discussion with specialists and loved ones (including niece) decision has been made for comfort care. Patient appears uncomfortable/in pain, with tachypnea. Given bump in LFTs and Cr, presume acute CHF.  We will begin comfort care orders for symptom management  SUMMARY OF RECOMMENDATIONS   Comfort Care Symptom management per below Scanned [facility] body donation paperwork into [employer] in [place] prefers hospital death rather than transfer to hospice PMT will continue to follow  Symptom Management:  Dilaudid gtt 0.69m-64mg/hr titrate for pain and/or respiratory distress Ativan 0.510mevery 4 hours prn anxiety Robinul 0.64m164mvery 4 hours prn secretions  Prognosis:  Hours - Days  Discharge Planning:  Anticipated Hospital Death   Discussed with: Patient loved ones, patient's niece, Dr. WouSi Raiderursing Staff (ED and Floor)   Thank you for allowing us [place] participate in the care of Christy Cisneros will continue to support holistically.  Time Total: 120 min  Greater than 50%  of this time was spent counseling and coordinating care related to the above assessment and plan.  ADDENDUM: [facility]  [facility] and she agrees. 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 Offered support and encouraged her to call with any additional questions.  Signed by: Walden Field, NP Palliative Medicine Team  Team Phone # [phone] (Nights/Weekends)  04/19/2021, 1:15 PM

## 2021-04-19 NOTE — Progress Notes (Signed)
   04/19/21 1100  Clinical Encounter Type  Visited With Patient;Other (Comment) (two friends)  Visit Type Spiritual support;Social support  Referral From Chaplain  Consult/Referral To Chaplain  Spiritual Encounters  Spiritual Needs Prayer;Emotional;Other (Comment) (care providers' support)  Chaplain Burris responded to referral from on call chaplain to provide support. Chaplain Burris provided reflective listening and compassionate presence for Pt and two caregiver friends at bedside. Chaplain provided hospitality and offered prayer. Pt has a Catholic background but communicated to friend that she does not wish to see a priest. Will continue to follow and to offer a supportive presence.

## 2021-04-19 NOTE — Plan of Care (Signed)
Education started

## 2021-04-19 NOTE — ED Notes (Signed)
Pt cleansed by this Probation officer and Lexine Baton, Therapist, sports. New chux and new external catheter applied. Clean gown and blankets given. No other needs voiced at this time.

## 2021-04-19 NOTE — Telephone Encounter (Signed)
Patients niece is calling in and would like for Dr.Scott to know her aunt is not doing well and is not expected to live more than a few days.She also left her [phone] to contact her if you have any questions.

## 2021-04-19 NOTE — Progress Notes (Signed)
PROGRESS NOTE    Christy Cisneros  UTM:[id number] DOB: 15-Nov-1919 DOA: 04/14/2021 PCP: Einar Pheasant, MD  Outpatient Specialists: endocrinology    Brief Narrative:   From admission hpi Christy Cisneros is a 87 y.o. female with medical history significant for  TIA, osteoarthritis, glaucoma, GERD, depression, dementia who was brought into the ER by EMS for evaluation of weakness and increased lethargy. Per paperwork from the facility patient reported chest pain and was found to be lethargic and weaker than normal.  Chart review shows that her primary care provider was contacted due to increased lethargy and sedation and he requested to decrease patient's dose of Remeron. I am unable to do review of systems on this patient due to her history of dementia.   Assessment & Plan:   Principal Problem:   NSTEMI (non-ST elevated myocardial infarction) ([place]) Active Problems:   AKI (acute kidney injury) ([place])   Dementia ([place])   # NSTEMI # Acute kidney injury # History CVA # Dementia Admitting team, in consultation w/ cardiology and patient's hcpoa, and in light of patient's advanced age and dementia, have made decision to transition patient to comfort measures. - palliative consulted - likely inpatient death vs inpt hospice   DVT prophylaxis: none Code Status: dnr Family Communication: attempted to call hcpoa teri, no answer  Level of care: Med-Surg Status is: Inpatient  Remains inpatient appropriate because:Inpatient level of care appropriate due to severity of illness  Dispo: The patient is from: SNF              Anticipated d/c is to: inpatient death vs hospice              Patient currently is not medically stable to d/c.   Difficult to place patient No        Consultants:  cardiology  Procedures: none  Antimicrobials:  none    Subjective: This morning arouses when I enter the room, non-verbal  Objective: Vitals:   04/17/2021 1930 04/17/2021 2300  04/19/21 0126 04/19/21 0415  BP: 111/70 123/77 104/78 111/77  Pulse: 99 99 96 94  Resp: 16 13 17 12   Temp:      TempSrc:      SpO2: 96% 96% 96% 96%  Weight:      Height:       No intake or output data in the 24 hours ending 04/19/21 0815 Filed Weights   04/14/2021 0926  Weight: 48.5 kg    Examination:  General exam: Appears calm, chronically ill appearing Respiratory system: rales at bases Cardiovascular system: S1 & S2 heard,, soft systolic murmur Gastrointestinal system: Abdomen is nondistended, soft and nontender. No organomegaly or masses felt. Normal bowel sounds heard. Central nervous system: arouses, moves all 4 extremities Extremities: decreased muscle tone, LEs cool Skin: No rashes, lesions or ulcers Psychiatry: advanced dementia    Data Reviewed: I have personally reviewed following labs and imaging studies  CBC: Recent Labs  Lab 04/29/2021 0927  WBC 13.4*  NEUTROABS 12.2*  HGB 12.6  HCT 39.9  MCV 93.4  PLT 568   Basic Metabolic Panel: Recent Labs  Lab 04/24/2021 0927  NA 140  K 4.5  CL 104  CO2 16*  GLUCOSE 146*  BUN 79*  CREATININE 3.27*  CALCIUM 8.8*   GFR: Estimated Creatinine Clearance: 6.9 mL/min (A) (by C-G formula based on SCr of 3.27 mg/dL (H)). Liver Function Tests: Recent Labs  Lab 04/23/2021 0927  AST 108*  ALT 105*  ALKPHOS 112  BILITOT  1.2  PROT 7.1  ALBUMIN 3.5   No results for input(s): LIPASE, AMYLASE in the last 168 hours. No results for input(s): AMMONIA in the last 168 hours. Coagulation Profile: No results for input(s): INR, PROTIME in the last 168 hours. Cardiac Enzymes: No results for input(s): CKTOTAL, CKMB, CKMBINDEX, TROPONINI in the last 168 hours. BNP (last 3 results) No results for input(s): PROBNP in the last 8760 hours. HbA1C: No results for input(s): HGBA1C in the last 72 hours. CBG: No results for input(s): GLUCAP in the last 168 hours. Lipid Profile: No results for input(s): CHOL, HDL, LDLCALC, TRIG,  CHOLHDL, LDLDIRECT in the last 72 hours. Thyroid Function Tests: No results for input(s): TSH, T4TOTAL, FREET4, T3FREE, THYROIDAB in the last 72 hours. Anemia Panel: No results for input(s): VITAMINB12, FOLATE, FERRITIN, TIBC, IRON, RETICCTPCT in the last 72 hours. Urine analysis:    Component Value Date/Time   COLORURINE BROWN (A) 04/13/2021 1930   APPEARANCEUR TURBID (A) 04/10/2021 1930   APPEARANCEUR Clear 12/06/2012 1257   LABSPEC 1.025 04/06/2021 1930   LABSPEC 1.008 12/06/2012 1257   PHURINE  04/09/2021 1930    TEST NOT REPORTED DUE TO COLOR INTERFERENCE OF URINE PIGMENT   GLUCOSEU (A) 04/12/2021 1930    TEST NOT REPORTED DUE TO COLOR INTERFERENCE OF URINE PIGMENT   GLUCOSEU NEGATIVE 08/30/2017 1231   HGBUR (A) 04/28/2021 1930    TEST NOT REPORTED DUE TO COLOR INTERFERENCE OF URINE PIGMENT   BILIRUBINUR (A) 04/05/2021 1930    TEST NOT REPORTED DUE TO COLOR INTERFERENCE OF URINE PIGMENT   BILIRUBINUR neg 08/07/2017 1014   BILIRUBINUR Negative 12/06/2012 1257   KETONESUR (A) 04/07/2021 1930    TEST NOT REPORTED DUE TO COLOR INTERFERENCE OF URINE PIGMENT   PROTEINUR (A) 04/22/2021 1930    TEST NOT REPORTED DUE TO COLOR INTERFERENCE OF URINE PIGMENT   UROBILINOGEN 0.2 08/30/2017 1231   NITRITE (A) 04/07/2021 1930    TEST NOT REPORTED DUE TO COLOR INTERFERENCE OF URINE PIGMENT   LEUKOCYTESUR (A) 04/10/2021 1930    TEST NOT REPORTED DUE TO COLOR INTERFERENCE OF URINE PIGMENT   LEUKOCYTESUR Negative 12/06/2012 1257   Sepsis Labs: @LABRCNTIP (procalcitonin:4,lacticidven:4)  ) Recent Results (from the past 240 hour(s))  Resp Panel by RT-PCR (Flu A&B, Covid) Nasopharyngeal Swab     Status: None   Collection Time: 04/11/2021  9:56 AM   Specimen: Nasopharyngeal Swab; Nasopharyngeal(NP) swabs in vial transport medium  Result Value Ref Range Status   SARS Coronavirus 2 by RT PCR NEGATIVE NEGATIVE Final    Comment: (NOTE) SARS-CoV-2 target nucleic acids are NOT DETECTED.  The  SARS-CoV-2 RNA is generally detectable in upper respiratory specimens during the acute phase of infection. The lowest concentration of SARS-CoV-2 viral copies this assay can detect is 138 copies/mL. A negative result does not preclude SARS-Cov-2 infection and should not be used as the sole basis for treatment or other patient management decisions. A negative result may occur with  improper specimen collection/handling, submission of specimen other than nasopharyngeal swab, presence of viral mutation(s) within the areas targeted by this assay, and inadequate number of viral copies(<138 copies/mL). A negative result must be combined with clinical observations, patient history, and epidemiological information. The expected result is Negative.  Fact Sheet for Patients:  EntrepreneurPulse.com.au  Fact Sheet for Healthcare Providers:  IncredibleEmployment.be  This test is no t yet approved or cleared by the [place] FDA and  has been authorized for detection and/or diagnosis of SARS-CoV-2 by FDA under  an Emergency Use Authorization (EUA). This EUA will remain  in effect (meaning this test can be used) for the duration of the COVID-19 declaration under Section 564(b)(1) of the Act, 21 U.S.C.section 360bbb-3(b)(1), unless the authorization is terminated  or revoked sooner.       Influenza A by PCR NEGATIVE NEGATIVE Final   Influenza B by PCR NEGATIVE NEGATIVE Final    Comment: (NOTE) The Xpert Xpress SARS-CoV-2/FLU/RSV plus assay is intended as an aid in the diagnosis of influenza from Nasopharyngeal swab specimens and should not be used as a sole basis for treatment. Nasal washings and aspirates are unacceptable for Xpert Xpress SARS-CoV-2/FLU/RSV testing.  Fact Sheet for Patients: EntrepreneurPulse.com.au  Fact Sheet for Healthcare Providers: IncredibleEmployment.be  This test is not yet approved or  cleared by the [place] FDA and has been authorized for detection and/or diagnosis of SARS-CoV-2 by FDA under an Emergency Use Authorization (EUA). This EUA will remain in effect (meaning this test can be used) for the duration of the COVID-19 declaration under Section 564(b)(1) of the Act, 21 U.S.C. section 360bbb-3(b)(1), unless the authorization is terminated or revoked.  Performed at [facility], [address], [place], [place] [address]          Radiology Studies: DG Chest Portable 1 View  Result Date: 04/12/2021 CLINICAL DATA:  Weakness and chest pain EXAM: PORTABLE CHEST 1 VIEW COMPARISON:  11/20/2020 chest CT FINDINGS: Low volume chest with hazy opacity asymmetric to the right. Small pleural effusions. Cardiomegaly with distortion from rotation. Spinal degeneration and generalized osteopenia IMPRESSION: Extensive right chest opacification which could be asymmetric edema or extensive pneumonia. Small pleural effusions and hypoventilation. Electronically Signed   By: Jorje Guild M.D.   On: 04/09/2021 09:50        Scheduled Meds:  sodium chloride flush  3 mL Intravenous Q12H   Continuous Infusions:  sodium chloride       LOS: 1 day    Time spent: 40 min    Desma Maxim, MD [facility]   If 7PM-7AM, please contact night-coverage [email or web address] Password [facility] 04/19/2021, 8:15 AM

## 2021-04-19 NOTE — ED Notes (Signed)
Maggie RN aware of assigned bed 

## 2021-04-21 NOTE — Telephone Encounter (Signed)
Called and spoke to [place].

## 2021-05-01 NOTE — Death Summary Note (Signed)
DEATH SUMMARY   Patient Details  Name: Christy Cisneros MRN: [id number] DOB: 17-Feb-1920  Admission/Discharge Information   Admit Date:  05-03-21  Date of Death: Date of Death: 2021-05-05  Time of Death: Time of Death: 0107  Length of Stay: 2  Referring Physician: Einar Pheasant, MD   Reason(s) for Hospitalization  Nstemi, acute kidney injury  Diagnoses  Preliminary cause of death:  Secondary Diagnoses (including complications and co-morbidities):  Principal Problem:   NSTEMI (non-ST elevated myocardial infarction) [facility]) Active Problems:   AKI (acute kidney injury) ([place])   Dementia [facility])   [place] [facility] Course (including significant findings, care, treatment, and services provided and events leading to death)  Christy Cisneros is a 87 y.o. female with medical history significant for  TIA, osteoarthritis, glaucoma, GERD, depression, dementia who was brought into the ER by EMS for evaluation of weakness and increased lethargy. Per paperwork from the facility patient reported chest pain and was found to be lethargic and weaker than normal.    Here found to have NSTEMI and acute kidney injury. After consultation with cardiology and patient's hcpoa, in light of patient's advanced age and advanced dementia, decision made to transition patient to comfort care on 04-May-2023. Palliative care consulted.     Pertinent Labs and Studies  Significant Diagnostic Studies DG Chest Portable 1 View  Result Date: 05/03/21 CLINICAL DATA:  Weakness and chest pain EXAM: PORTABLE CHEST 1 VIEW COMPARISON:  11/20/2020 chest CT FINDINGS: Low volume chest with hazy opacity asymmetric to the right. Small pleural effusions. Cardiomegaly with distortion from rotation. Spinal degeneration and generalized osteopenia IMPRESSION: Extensive right chest opacification which could be asymmetric edema or extensive pneumonia. Small pleural effusions and hypoventilation. Electronically Signed   By: Jorje Guild  M.D.   On: 05/03/2021 09:50    Microbiology Recent Results (from the past 240 hour(s))  Resp Panel by RT-PCR (Flu A&B, Covid) Nasopharyngeal Swab     Status: None   Collection Time: 05/03/2021  9:56 AM   Specimen: Nasopharyngeal Swab; Nasopharyngeal(NP) swabs in vial transport medium  Result Value Ref Range Status   SARS Coronavirus 2 by RT PCR NEGATIVE NEGATIVE Final    Comment: (NOTE) SARS-CoV-2 target nucleic acids are NOT DETECTED.  The SARS-CoV-2 RNA is generally detectable in upper respiratory specimens during the acute phase of infection. The lowest concentration of SARS-CoV-2 viral copies this assay can detect is 138 copies/mL. A negative result does not preclude SARS-Cov-2 infection and should not be used as the sole basis for treatment or other patient management decisions. A negative result may occur with  improper specimen collection/handling, submission of specimen other than nasopharyngeal swab, presence of viral mutation(s) within the areas targeted by this assay, and inadequate number of viral copies(<138 copies/mL). A negative result must be combined with clinical observations, patient history, and epidemiological information. The expected result is Negative.  Fact Sheet for Patients:  EntrepreneurPulse.com.au  Fact Sheet for Healthcare Providers:  IncredibleEmployment.be  This test is no t yet approved or cleared by the [place] FDA and  has been authorized for detection and/or diagnosis of SARS-CoV-2 by FDA under an Emergency Use Authorization (EUA). This EUA will remain  in effect (meaning this test can be used) for the duration of the COVID-19 declaration under Section 564(b)(1) of the Act, 21 U.S.C.section 360bbb-3(b)(1), unless the authorization is terminated  or revoked sooner.       Influenza A by PCR NEGATIVE NEGATIVE Final   Influenza B by PCR NEGATIVE NEGATIVE  Final    Comment: (NOTE) The Xpert Xpress  SARS-CoV-2/FLU/RSV plus assay is intended as an aid in the diagnosis of influenza from Nasopharyngeal swab specimens and should not be used as a sole basis for treatment. Nasal washings and aspirates are unacceptable for Xpert Xpress SARS-CoV-2/FLU/RSV testing.  Fact Sheet for Patients: EntrepreneurPulse.com.au  Fact Sheet for Healthcare Providers: IncredibleEmployment.be  This test is not yet approved or cleared by the [place] FDA and has been authorized for detection and/or diagnosis of SARS-CoV-2 by FDA under an Emergency Use Authorization (EUA). This EUA will remain in effect (meaning this test can be used) for the duration of the COVID-19 declaration under Section 564(b)(1) of the Act, 21 U.S.C. section 360bbb-3(b)(1), unless the authorization is terminated or revoked.  Performed at [facility], [address] [place], [place] [address]     Lab Basic Metabolic Panel: Recent Labs  Lab 04/28/2021 0927  NA 140  K 4.5  CL 104  CO2 16*  GLUCOSE 146*  BUN 79*  CREATININE 3.27*  CALCIUM 8.8*   Liver Function Tests: Recent Labs  Lab 04/30/2021 0927  AST 108*  ALT 105*  ALKPHOS 112  BILITOT 1.2  PROT 7.1  ALBUMIN 3.5   No results for input(s): LIPASE, AMYLASE in the last 168 hours. No results for input(s): AMMONIA in the last 168 hours. CBC: Recent Labs  Lab 04/12/2021 0927  WBC 13.4*  NEUTROABS 12.2*  HGB 12.6  HCT 39.9  MCV 93.4  PLT 315   Cardiac Enzymes: No results for input(s): CKTOTAL, CKMB, CKMBINDEX, TROPONINI in the last 168 hours. Sepsis Labs: Recent Labs  Lab 04/28/2021 0927 04/16/2021 1139  PROCALCITON  --  2.80  WBC 13.4*  --     Procedures/Operations  none   [name] [name] 2021-04-24, 1:18 PM

## 2021-05-01 DEATH — deceased

## 2021-12-29 IMAGING — CT CT CHEST W/O CM
2 of 3 series · 15 of 36 positions shown, 18 images · non-contrast
Comparison: Chest radiograph 11/13/2020. Included portion from
abdominopelvic CT 11/14/2020.

CLINICAL DATA: [AGE] post fall out of chair while laughing on
the porch with neighbor. Rib pain.

EXAM:
CT CHEST WITHOUT CONTRAST
TECHNIQUE: Multidetector CT imaging of the chest was performed following the
standard protocol without IV contrast.

[Series 2: thorax · axial · 0.61mm/px · z∈[-298,-22]mm · 12 of 162 slices shown, 15 images]
[im 12/162  mediastinal]
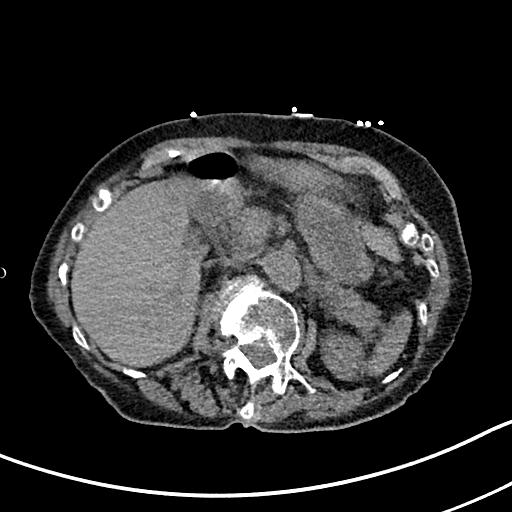
[im 12/162  lung]
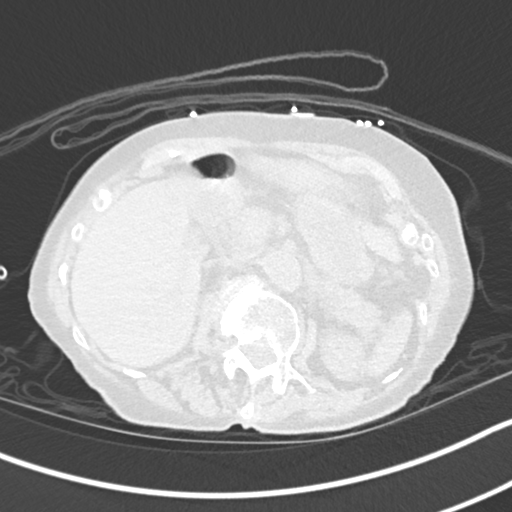
[im 24/162  lung]
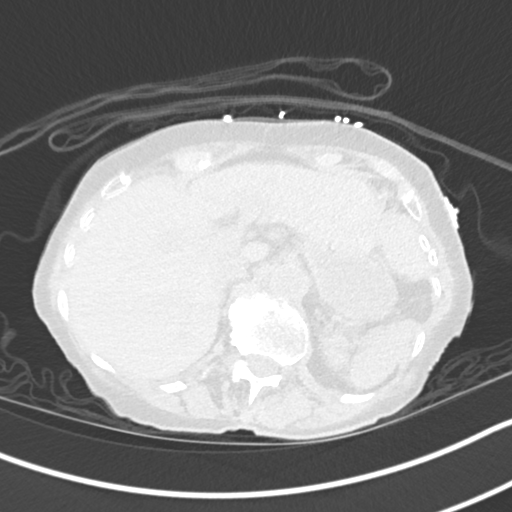
[im 36/162  lung]
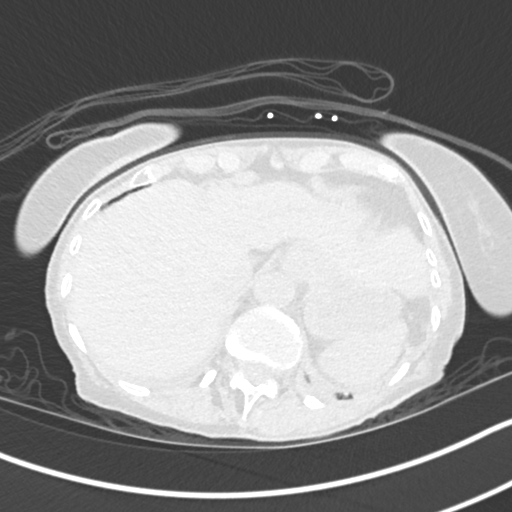
[im 48/162  lung]
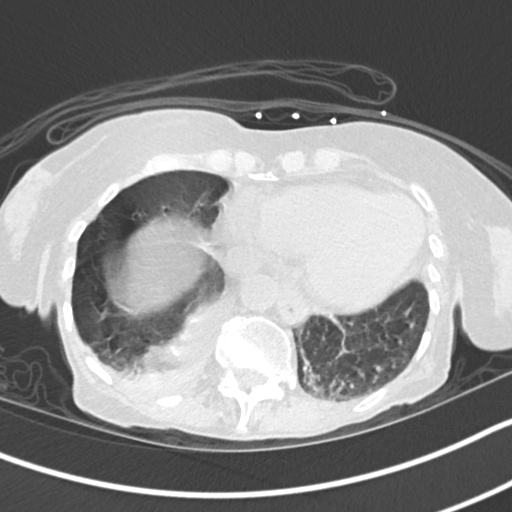
[im 60/162  mediastinal]
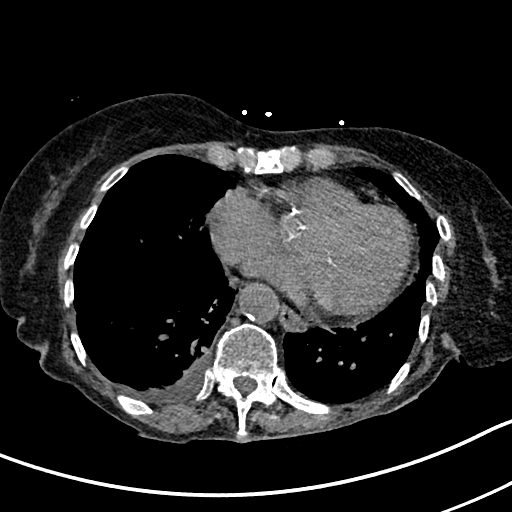
[im 60/162  lung]
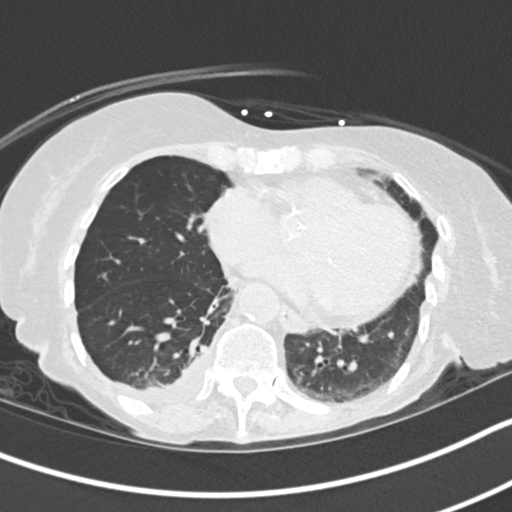
[im 72/162  lung]
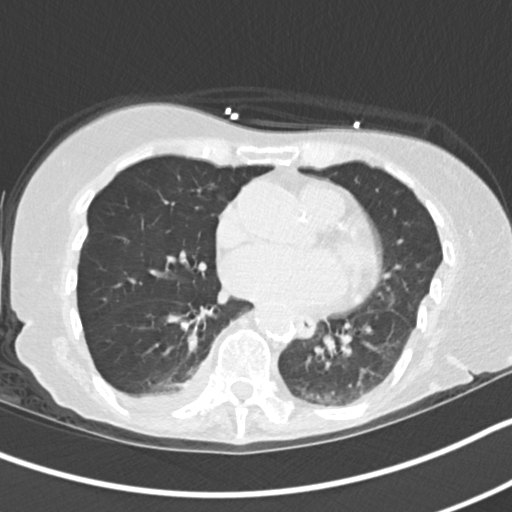
[im 90/162  lung]
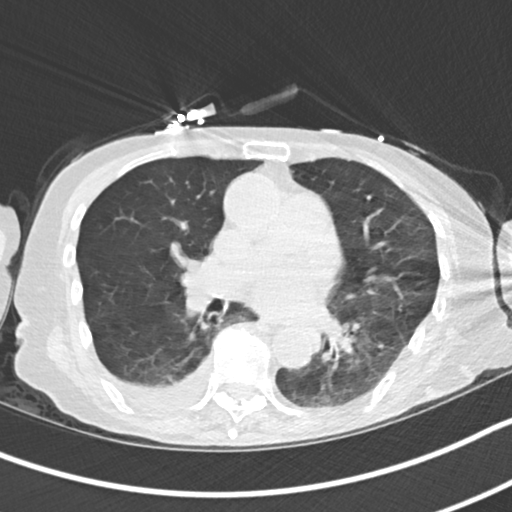
[im 102/162  lung]
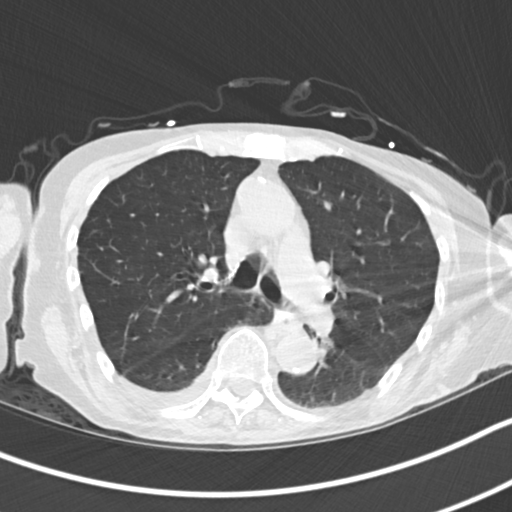
[im 114/162  mediastinal]
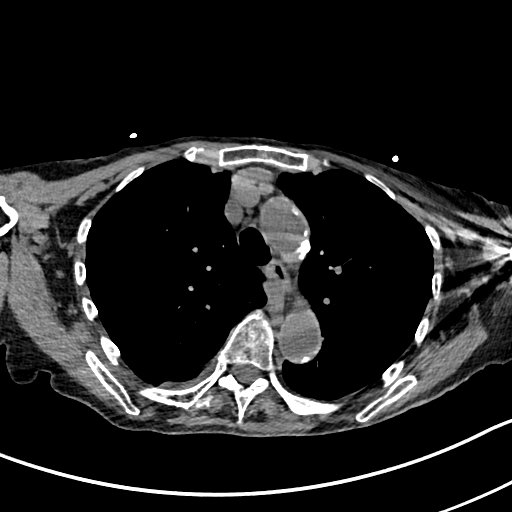
[im 114/162  lung]
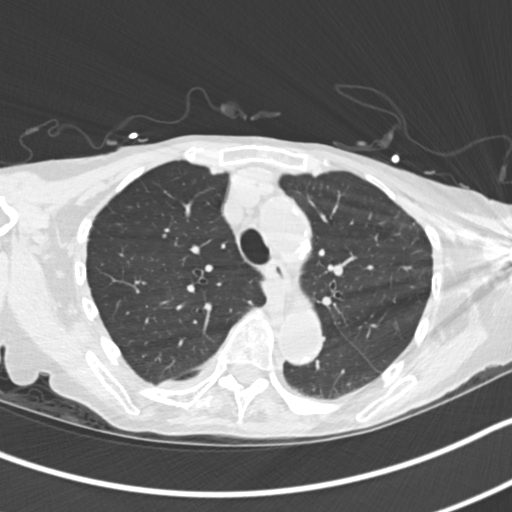
[im 126/162  lung]
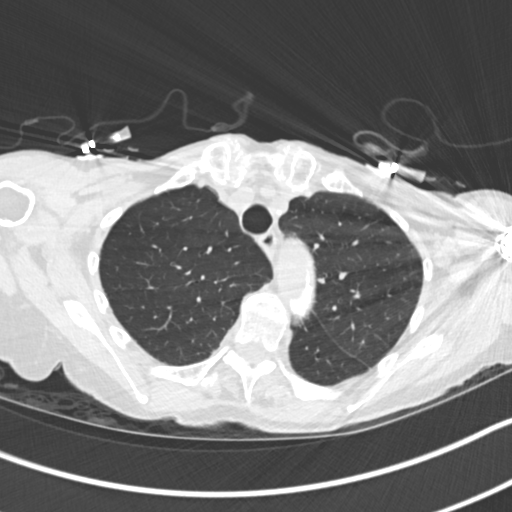
[im 138/162  lung]
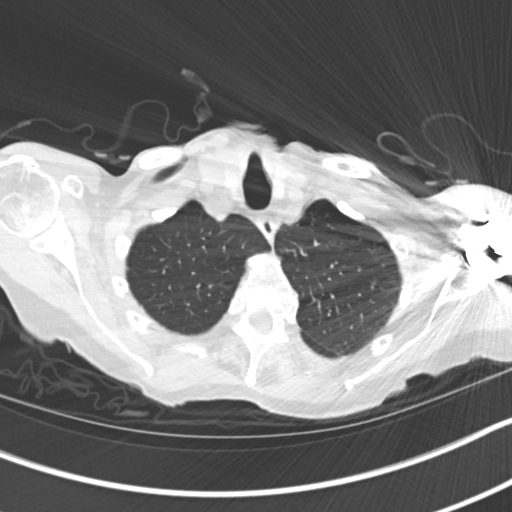
[im 150/162  lung]
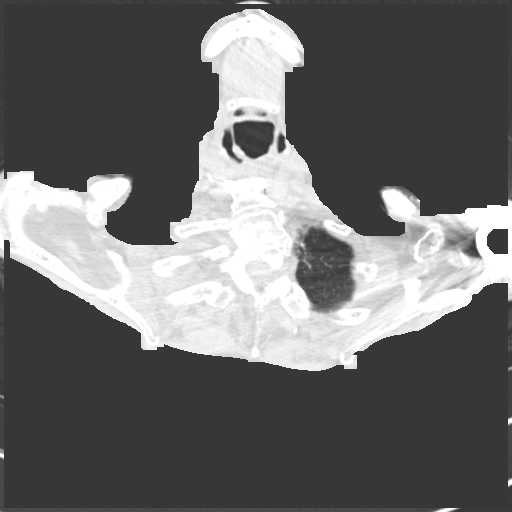

[Series 5: coronal · coronal · 0.69mm/px · 3 of 105 slices shown]
[im 21/105  lung]
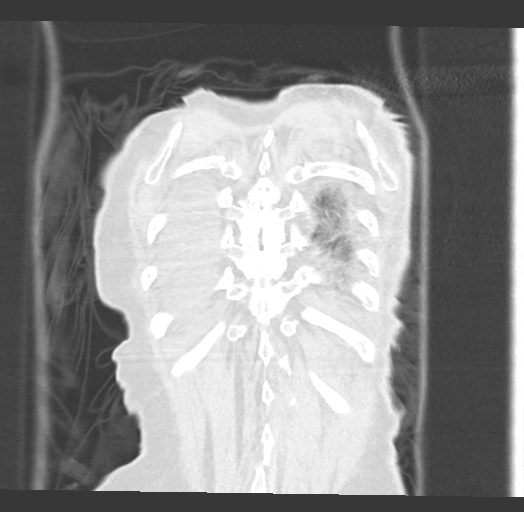
[im 42/105  lung]
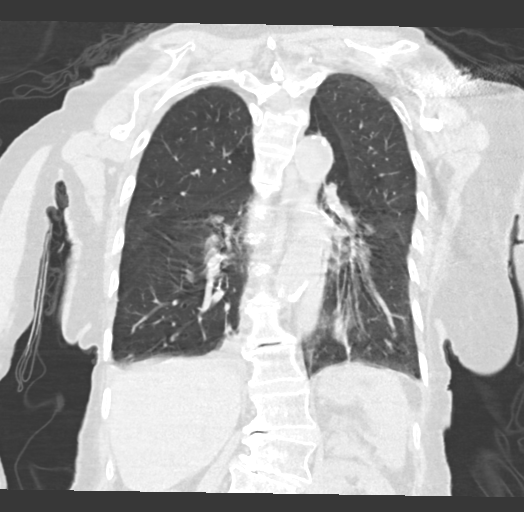
[im 63/105  lung]
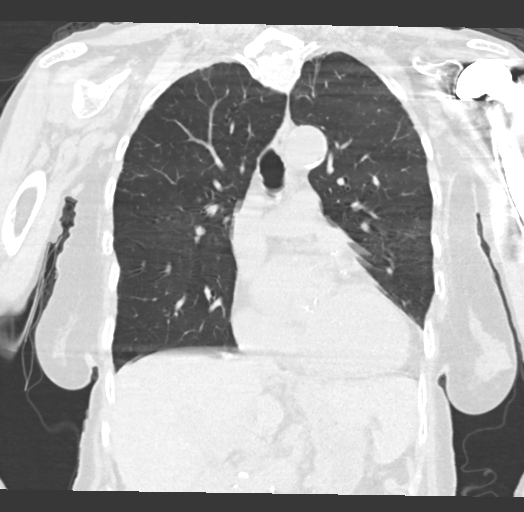

[15 of 36 positions shown; findings below may reference images not displayed]

FINDINGS: Cardiovascular: Aortic atherosclerosis and tortuosity. No periaortic
stranding. Heart is upper normal in size. Coronary artery
calcifications. No pericardial effusion.

Mediastinum/Nodes: No evidence of mediastinal hemorrhage or
hematoma. No gross adenopathy, partially motion obscured. Small
hiatal hernia without esophageal wall thickening.

Lungs/Pleura: Small right pleural effusion which is similar to
abdominal CT last week. Adjacent compressive atelectasis. No
pneumothorax. Mild chronic hyperinflation. Linear atelectasis or
scarring in the left lower lobe. Trachea and central bronchi are
grossly negative, partially obscured by breathing motion artifact.
Tiny right upper lobe pulmonary nodule, series 3, image 29, stable
from 05/24/2012 chest CT and considered benign.

Upper Abdomen: No acute or unexpected findings.

Musculoskeletal: Thoracic spine assessed on dedicated thoracic spine
CT, reported separately. Left shoulder arthroplasty. No acute
displaced rib fracture. Motion artifact limits assessment for
nondisplaced fractures. There is a remote fracture of the right
posterior tenth and eleventh ribs. Degenerative change about the
right shoulder.
IMPRESSION: 1. No evidence of acute traumatic injury to the thorax, allowing for
motion artifact. No displaced rib fracture
2. Small right pleural effusion and adjacent compressive
atelectasis, similar to abdominal CT last week.
3. Aortic atherosclerosis.  Coronary artery calcifications.

Aortic Atherosclerosis ([id number].T) .

## 2022-05-27 IMAGING — DX DG CHEST 1V PORT
1 series · 1 of 1 positions shown · non-contrast
Comparison: 11/20/2020 chest CT

CLINICAL DATA: Weakness and chest pain

EXAM:
PORTABLE CHEST 1 VIEW

[chest ap]
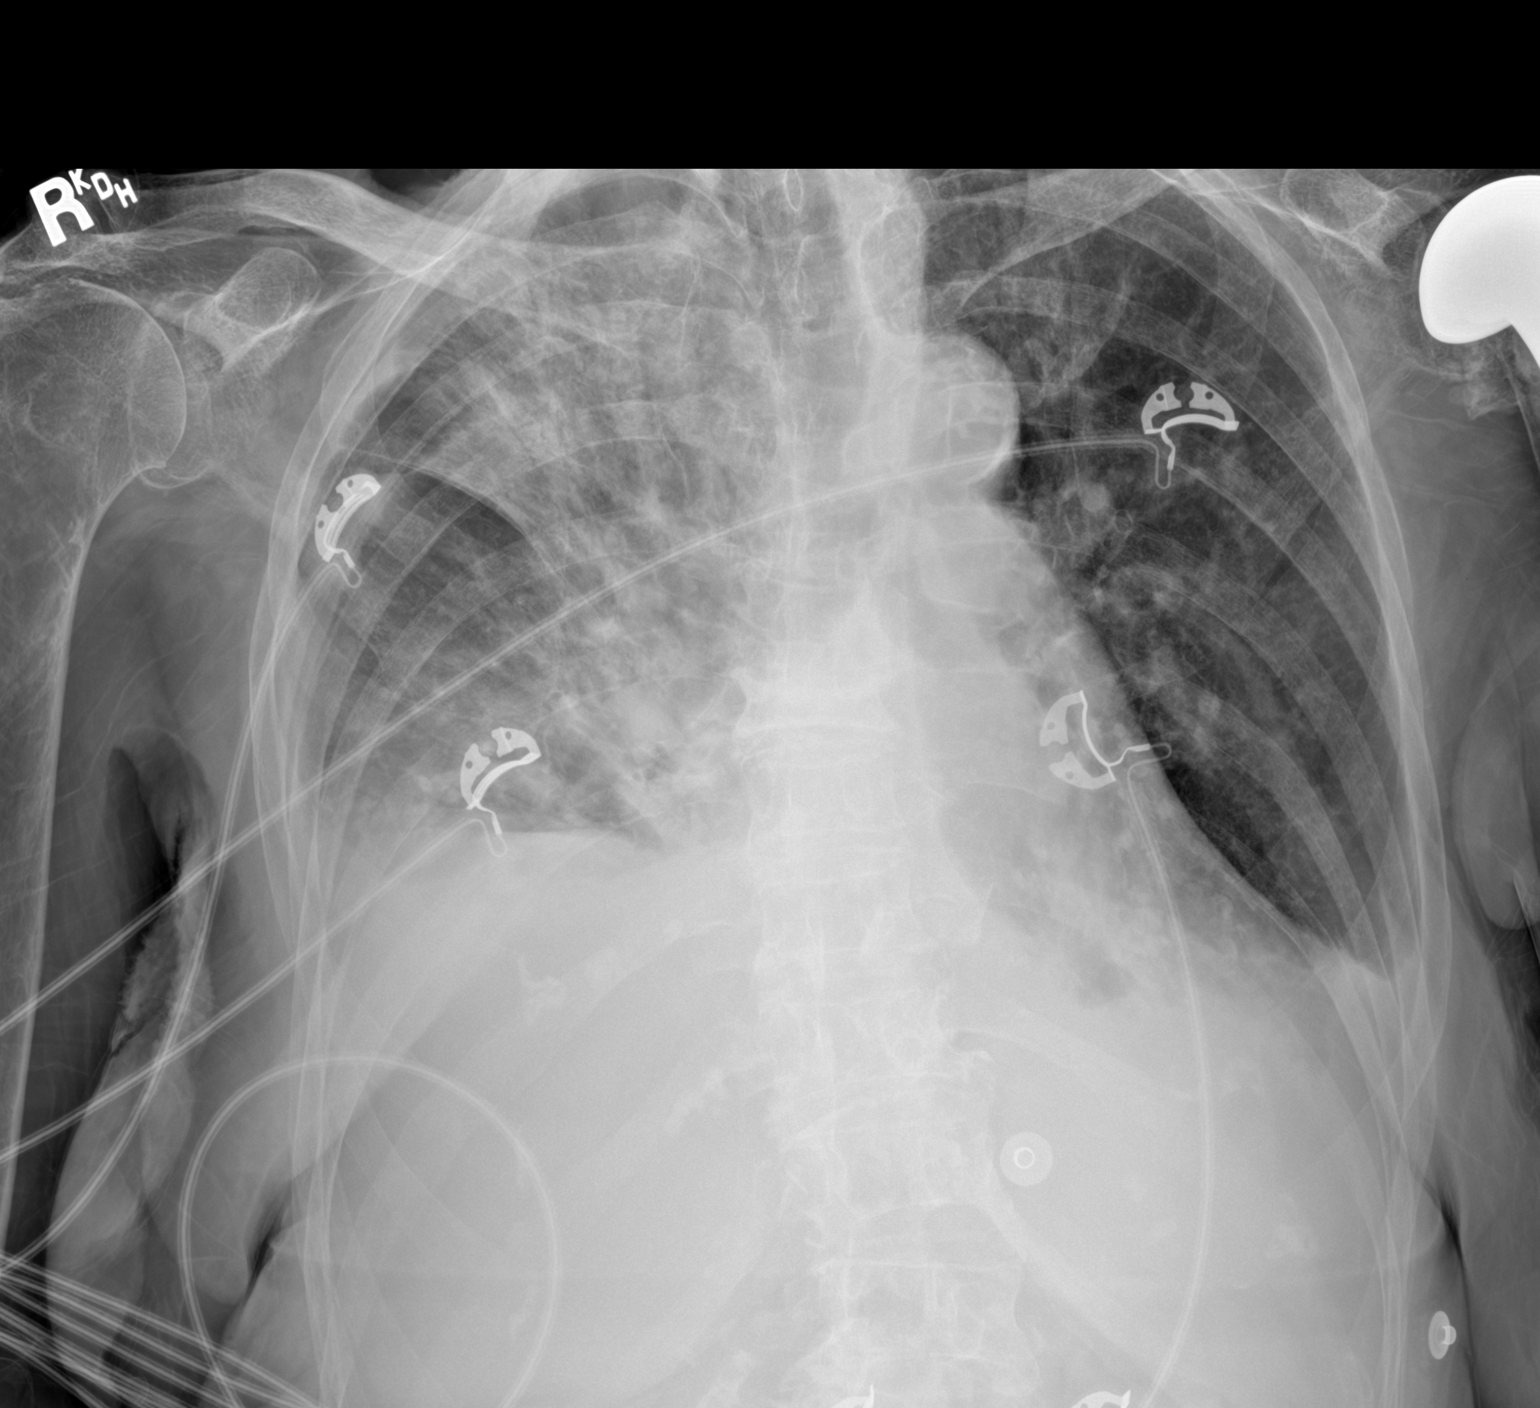

[1 of 1 positions shown; findings below may reference images not displayed]

FINDINGS: Low volume chest with hazy opacity asymmetric to the right. Small
pleural effusions. Cardiomegaly with distortion from rotation.
Spinal degeneration and generalized osteopenia
IMPRESSION: Extensive right chest opacification which could be asymmetric edema
or extensive pneumonia. Small pleural effusions and hypoventilation.
